# Patient Record
Sex: Female | Born: 1955 | Race: White | Hispanic: No | Marital: Married | Smoking: Former smoker
Health system: Southern US, Community
[De-identification: ages and names within clinical notes are randomized; demographics above are authoritative.]

## PROBLEM LIST (undated history)

## (undated) DIAGNOSIS — R351 Nocturia: Secondary | ICD-10-CM

## (undated) DIAGNOSIS — C760 Malignant neoplasm of head, face and neck: Secondary | ICD-10-CM

## (undated) DIAGNOSIS — N200 Calculus of kidney: Secondary | ICD-10-CM

## (undated) DIAGNOSIS — M1990 Unspecified osteoarthritis, unspecified site: Secondary | ICD-10-CM

## (undated) DIAGNOSIS — J449 Chronic obstructive pulmonary disease, unspecified: Secondary | ICD-10-CM

## (undated) DIAGNOSIS — F419 Anxiety disorder, unspecified: Secondary | ICD-10-CM

## (undated) DIAGNOSIS — F32A Depression, unspecified: Secondary | ICD-10-CM

## (undated) DIAGNOSIS — T884XXA Failed or difficult intubation, initial encounter: Secondary | ICD-10-CM

## (undated) DIAGNOSIS — N3941 Urge incontinence: Secondary | ICD-10-CM

## (undated) DIAGNOSIS — E119 Type 2 diabetes mellitus without complications: Secondary | ICD-10-CM

## (undated) DIAGNOSIS — Z6841 Body Mass Index (BMI) 40.0 and over, adult: Secondary | ICD-10-CM

## (undated) DIAGNOSIS — N289 Disorder of kidney and ureter, unspecified: Secondary | ICD-10-CM

## (undated) DIAGNOSIS — G4730 Sleep apnea, unspecified: Secondary | ICD-10-CM

## (undated) DIAGNOSIS — I10 Essential (primary) hypertension: Secondary | ICD-10-CM

## (undated) DIAGNOSIS — E1140 Type 2 diabetes mellitus with diabetic neuropathy, unspecified: Secondary | ICD-10-CM

## (undated) DIAGNOSIS — G629 Polyneuropathy, unspecified: Secondary | ICD-10-CM

## (undated) DIAGNOSIS — R0602 Shortness of breath: Secondary | ICD-10-CM

## (undated) DIAGNOSIS — F329 Major depressive disorder, single episode, unspecified: Secondary | ICD-10-CM

## (undated) DIAGNOSIS — R21 Rash and other nonspecific skin eruption: Secondary | ICD-10-CM

## (undated) DIAGNOSIS — M797 Fibromyalgia: Secondary | ICD-10-CM

## (undated) DIAGNOSIS — J189 Pneumonia, unspecified organism: Secondary | ICD-10-CM

## (undated) HISTORY — PX: SALIVARY STONE REMOVAL: SHX5213

## (undated) HISTORY — DX: Morbid (severe) obesity due to excess calories: E66.01

## (undated) HISTORY — PX: BRAIN SURGERY: SHX531

## (undated) HISTORY — DX: Body Mass Index (BMI) 40.0 and over, adult: Z684

## (undated) HISTORY — PX: TUBAL LIGATION: SHX77

## (undated) HISTORY — PX: TONSILLECTOMY: SUR1361

## (undated) HISTORY — PX: LARYNGECTOMY: SUR815

## (undated) HISTORY — PX: CHOLECYSTECTOMY: SHX55

## (undated) HISTORY — PX: OTHER SURGICAL HISTORY: SHX169

---

## 1980-02-26 HISTORY — PX: BREAST LUMPECTOMY: SHX2

## 1997-09-02 ENCOUNTER — Emergency Department (HOSPITAL_COMMUNITY): Admission: EM | Admit: 1997-09-02 | Discharge: 1997-09-02 | Payer: Self-pay | Admitting: Emergency Medicine

## 1997-12-14 ENCOUNTER — Emergency Department (HOSPITAL_COMMUNITY): Admission: EM | Admit: 1997-12-14 | Discharge: 1997-12-14 | Payer: Self-pay | Admitting: Emergency Medicine

## 1997-12-14 ENCOUNTER — Encounter: Payer: Self-pay | Admitting: Emergency Medicine

## 1999-01-16 ENCOUNTER — Encounter: Admission: RE | Admit: 1999-01-16 | Discharge: 1999-01-16 | Payer: Self-pay | Admitting: *Deleted

## 1999-01-16 ENCOUNTER — Encounter: Payer: Self-pay | Admitting: *Deleted

## 1999-06-25 ENCOUNTER — Encounter: Payer: Self-pay | Admitting: Family Medicine

## 1999-06-25 ENCOUNTER — Ambulatory Visit (HOSPITAL_COMMUNITY): Admission: RE | Admit: 1999-06-25 | Discharge: 1999-06-25 | Payer: Self-pay | Admitting: Family Medicine

## 1999-06-29 ENCOUNTER — Encounter: Payer: Self-pay | Admitting: Family Medicine

## 1999-06-29 ENCOUNTER — Ambulatory Visit (HOSPITAL_COMMUNITY): Admission: RE | Admit: 1999-06-29 | Discharge: 1999-06-29 | Payer: Self-pay | Admitting: Family Medicine

## 1999-11-14 ENCOUNTER — Ambulatory Visit (HOSPITAL_COMMUNITY): Admission: RE | Admit: 1999-11-14 | Discharge: 1999-11-14 | Payer: Self-pay | Admitting: Family Medicine

## 2000-08-19 ENCOUNTER — Encounter: Payer: Self-pay | Admitting: Family Medicine

## 2000-08-19 ENCOUNTER — Ambulatory Visit (HOSPITAL_COMMUNITY): Admission: RE | Admit: 2000-08-19 | Discharge: 2000-08-19 | Payer: Self-pay | Admitting: Family Medicine

## 2000-08-23 ENCOUNTER — Emergency Department (HOSPITAL_COMMUNITY): Admission: EM | Admit: 2000-08-23 | Discharge: 2000-08-24 | Payer: Self-pay | Admitting: Emergency Medicine

## 2000-09-23 ENCOUNTER — Encounter: Payer: Self-pay | Admitting: Orthopedic Surgery

## 2000-09-23 ENCOUNTER — Encounter: Admission: RE | Admit: 2000-09-23 | Discharge: 2000-09-23 | Payer: Self-pay | Admitting: Orthopedic Surgery

## 2000-10-26 ENCOUNTER — Emergency Department (HOSPITAL_COMMUNITY): Admission: EM | Admit: 2000-10-26 | Discharge: 2000-10-26 | Payer: Self-pay | Admitting: Emergency Medicine

## 2000-10-26 ENCOUNTER — Encounter: Payer: Self-pay | Admitting: Emergency Medicine

## 2001-03-01 ENCOUNTER — Emergency Department (HOSPITAL_COMMUNITY): Admission: EM | Admit: 2001-03-01 | Discharge: 2001-03-02 | Payer: Self-pay | Admitting: Emergency Medicine

## 2001-03-02 ENCOUNTER — Encounter: Payer: Self-pay | Admitting: *Deleted

## 2001-05-23 ENCOUNTER — Encounter: Payer: Self-pay | Admitting: Emergency Medicine

## 2001-05-23 ENCOUNTER — Emergency Department (HOSPITAL_COMMUNITY): Admission: EM | Admit: 2001-05-23 | Discharge: 2001-05-23 | Payer: Self-pay | Admitting: Emergency Medicine

## 2001-06-10 ENCOUNTER — Encounter: Payer: Self-pay | Admitting: Orthopedic Surgery

## 2001-06-10 ENCOUNTER — Encounter: Admission: RE | Admit: 2001-06-10 | Discharge: 2001-06-10 | Payer: Self-pay | Admitting: Orthopedic Surgery

## 2001-06-11 ENCOUNTER — Encounter (INDEPENDENT_AMBULATORY_CARE_PROVIDER_SITE_OTHER): Payer: Self-pay | Admitting: Specialist

## 2001-06-11 ENCOUNTER — Ambulatory Visit (HOSPITAL_BASED_OUTPATIENT_CLINIC_OR_DEPARTMENT_OTHER): Admission: RE | Admit: 2001-06-11 | Discharge: 2001-06-11 | Payer: Self-pay | Admitting: Orthopedic Surgery

## 2001-06-13 ENCOUNTER — Emergency Department (HOSPITAL_COMMUNITY): Admission: EM | Admit: 2001-06-13 | Discharge: 2001-06-13 | Payer: Self-pay | Admitting: Emergency Medicine

## 2001-07-23 ENCOUNTER — Ambulatory Visit (HOSPITAL_BASED_OUTPATIENT_CLINIC_OR_DEPARTMENT_OTHER): Admission: RE | Admit: 2001-07-23 | Discharge: 2001-07-23 | Payer: Self-pay | Admitting: Orthopedic Surgery

## 2002-05-01 ENCOUNTER — Emergency Department (HOSPITAL_COMMUNITY): Admission: EM | Admit: 2002-05-01 | Discharge: 2002-05-01 | Payer: Self-pay | Admitting: Emergency Medicine

## 2002-08-02 ENCOUNTER — Ambulatory Visit (HOSPITAL_BASED_OUTPATIENT_CLINIC_OR_DEPARTMENT_OTHER): Admission: RE | Admit: 2002-08-02 | Discharge: 2002-08-02 | Payer: Self-pay | Admitting: Family Medicine

## 2002-08-20 ENCOUNTER — Encounter: Payer: Self-pay | Admitting: Family Medicine

## 2002-08-20 ENCOUNTER — Ambulatory Visit (HOSPITAL_COMMUNITY): Admission: RE | Admit: 2002-08-20 | Discharge: 2002-08-20 | Payer: Self-pay | Admitting: Family Medicine

## 2003-08-04 ENCOUNTER — Emergency Department (HOSPITAL_COMMUNITY): Admission: EM | Admit: 2003-08-04 | Discharge: 2003-08-04 | Payer: Self-pay | Admitting: Emergency Medicine

## 2003-09-08 ENCOUNTER — Emergency Department (HOSPITAL_COMMUNITY): Admission: EM | Admit: 2003-09-08 | Discharge: 2003-09-08 | Payer: Self-pay | Admitting: Emergency Medicine

## 2004-01-25 ENCOUNTER — Emergency Department (HOSPITAL_COMMUNITY): Admission: EM | Admit: 2004-01-25 | Discharge: 2004-01-25 | Payer: Self-pay | Admitting: Emergency Medicine

## 2004-03-07 ENCOUNTER — Emergency Department (HOSPITAL_COMMUNITY): Admission: EM | Admit: 2004-03-07 | Discharge: 2004-03-07 | Payer: Self-pay | Admitting: *Deleted

## 2004-03-21 ENCOUNTER — Emergency Department (HOSPITAL_COMMUNITY): Admission: EM | Admit: 2004-03-21 | Discharge: 2004-03-21 | Payer: Self-pay | Admitting: Emergency Medicine

## 2004-04-04 ENCOUNTER — Emergency Department (HOSPITAL_COMMUNITY): Admission: EM | Admit: 2004-04-04 | Discharge: 2004-04-04 | Payer: Self-pay | Admitting: Emergency Medicine

## 2004-05-01 ENCOUNTER — Emergency Department (HOSPITAL_COMMUNITY): Admission: EM | Admit: 2004-05-01 | Discharge: 2004-05-01 | Payer: Self-pay | Admitting: Emergency Medicine

## 2004-05-07 ENCOUNTER — Ambulatory Visit: Payer: Self-pay | Admitting: Family Medicine

## 2004-08-08 ENCOUNTER — Emergency Department (HOSPITAL_COMMUNITY): Admission: EM | Admit: 2004-08-08 | Discharge: 2004-08-08 | Payer: Self-pay | Admitting: Emergency Medicine

## 2004-08-13 ENCOUNTER — Ambulatory Visit: Payer: Self-pay | Admitting: Family Medicine

## 2004-08-25 ENCOUNTER — Emergency Department (HOSPITAL_COMMUNITY): Admission: EM | Admit: 2004-08-25 | Discharge: 2004-08-25 | Payer: Self-pay | Admitting: Emergency Medicine

## 2004-08-27 ENCOUNTER — Ambulatory Visit: Payer: Self-pay | Admitting: Family Medicine

## 2004-08-27 ENCOUNTER — Ambulatory Visit (HOSPITAL_COMMUNITY): Admission: RE | Admit: 2004-08-27 | Discharge: 2004-08-27 | Payer: Self-pay | Admitting: Family Medicine

## 2004-09-03 ENCOUNTER — Ambulatory Visit (HOSPITAL_COMMUNITY): Admission: RE | Admit: 2004-09-03 | Discharge: 2004-09-03 | Payer: Self-pay | Admitting: Family Medicine

## 2004-09-19 ENCOUNTER — Ambulatory Visit: Payer: Self-pay | Admitting: Pulmonary Disease

## 2004-11-17 ENCOUNTER — Ambulatory Visit (HOSPITAL_BASED_OUTPATIENT_CLINIC_OR_DEPARTMENT_OTHER): Admission: RE | Admit: 2004-11-17 | Discharge: 2004-11-17 | Payer: Self-pay | Admitting: Pulmonary Disease

## 2004-11-29 ENCOUNTER — Ambulatory Visit: Payer: Self-pay | Admitting: Pulmonary Disease

## 2004-12-25 ENCOUNTER — Ambulatory Visit: Payer: Self-pay | Admitting: Pulmonary Disease

## 2005-01-23 ENCOUNTER — Ambulatory Visit: Payer: Self-pay | Admitting: Pulmonary Disease

## 2005-04-16 ENCOUNTER — Ambulatory Visit: Payer: Self-pay | Admitting: Pulmonary Disease

## 2005-05-17 ENCOUNTER — Ambulatory Visit: Payer: Self-pay | Admitting: Pulmonary Disease

## 2005-07-01 ENCOUNTER — Ambulatory Visit: Payer: Self-pay | Admitting: Family Medicine

## 2005-08-21 ENCOUNTER — Emergency Department (HOSPITAL_COMMUNITY): Admission: EM | Admit: 2005-08-21 | Discharge: 2005-08-21 | Payer: Self-pay | Admitting: Emergency Medicine

## 2005-08-23 ENCOUNTER — Encounter (INDEPENDENT_AMBULATORY_CARE_PROVIDER_SITE_OTHER): Payer: Self-pay | Admitting: Specialist

## 2005-08-23 ENCOUNTER — Ambulatory Visit: Payer: Self-pay | Admitting: Infectious Diseases

## 2005-08-23 ENCOUNTER — Ambulatory Visit: Payer: Self-pay | Admitting: Internal Medicine

## 2005-08-23 ENCOUNTER — Inpatient Hospital Stay (HOSPITAL_COMMUNITY): Admission: EM | Admit: 2005-08-23 | Discharge: 2005-08-29 | Payer: Self-pay | Admitting: Emergency Medicine

## 2005-08-24 ENCOUNTER — Encounter (INDEPENDENT_AMBULATORY_CARE_PROVIDER_SITE_OTHER): Payer: Self-pay | Admitting: Interventional Cardiology

## 2005-09-16 ENCOUNTER — Ambulatory Visit: Payer: Self-pay | Admitting: Family Medicine

## 2005-10-03 ENCOUNTER — Ambulatory Visit: Payer: Self-pay | Admitting: *Deleted

## 2005-11-12 ENCOUNTER — Ambulatory Visit: Payer: Self-pay | Admitting: Family Medicine

## 2005-11-25 DIAGNOSIS — R809 Proteinuria, unspecified: Secondary | ICD-10-CM | POA: Insufficient documentation

## 2005-11-28 ENCOUNTER — Ambulatory Visit: Payer: Self-pay | Admitting: Family Medicine

## 2005-12-04 ENCOUNTER — Ambulatory Visit (HOSPITAL_COMMUNITY): Admission: RE | Admit: 2005-12-04 | Discharge: 2005-12-04 | Payer: Self-pay | Admitting: Internal Medicine

## 2005-12-04 ENCOUNTER — Encounter: Payer: Self-pay | Admitting: Vascular Surgery

## 2005-12-04 ENCOUNTER — Ambulatory Visit (HOSPITAL_COMMUNITY): Admission: RE | Admit: 2005-12-04 | Discharge: 2005-12-04 | Payer: Self-pay | Admitting: Family Medicine

## 2005-12-13 ENCOUNTER — Encounter: Admission: RE | Admit: 2005-12-13 | Discharge: 2005-12-13 | Payer: Self-pay | Admitting: Family Medicine

## 2006-03-11 ENCOUNTER — Other Ambulatory Visit: Admission: RE | Admit: 2006-03-11 | Discharge: 2006-03-11 | Payer: Self-pay | Admitting: Family Medicine

## 2006-03-11 ENCOUNTER — Encounter (INDEPENDENT_AMBULATORY_CARE_PROVIDER_SITE_OTHER): Payer: Self-pay | Admitting: Family Medicine

## 2006-03-11 ENCOUNTER — Ambulatory Visit: Payer: Self-pay | Admitting: Family Medicine

## 2006-06-30 ENCOUNTER — Encounter: Admission: RE | Admit: 2006-06-30 | Discharge: 2006-06-30 | Payer: Self-pay | Admitting: Sports Medicine

## 2006-10-28 DIAGNOSIS — G619 Inflammatory polyneuropathy, unspecified: Secondary | ICD-10-CM | POA: Insufficient documentation

## 2006-10-28 DIAGNOSIS — G622 Polyneuropathy due to other toxic agents: Secondary | ICD-10-CM | POA: Insufficient documentation

## 2006-10-28 DIAGNOSIS — B1710 Acute hepatitis C without hepatic coma: Secondary | ICD-10-CM

## 2006-10-28 DIAGNOSIS — M1710 Unilateral primary osteoarthritis, unspecified knee: Secondary | ICD-10-CM

## 2006-10-28 DIAGNOSIS — E119 Type 2 diabetes mellitus without complications: Secondary | ICD-10-CM

## 2006-10-28 DIAGNOSIS — I10 Essential (primary) hypertension: Secondary | ICD-10-CM

## 2006-10-28 DIAGNOSIS — G4733 Obstructive sleep apnea (adult) (pediatric): Secondary | ICD-10-CM

## 2006-11-02 DIAGNOSIS — E785 Hyperlipidemia, unspecified: Secondary | ICD-10-CM | POA: Insufficient documentation

## 2006-11-02 DIAGNOSIS — I739 Peripheral vascular disease, unspecified: Secondary | ICD-10-CM

## 2006-12-07 ENCOUNTER — Emergency Department (HOSPITAL_COMMUNITY): Admission: EM | Admit: 2006-12-07 | Discharge: 2006-12-07 | Payer: Self-pay | Admitting: Emergency Medicine

## 2007-02-24 ENCOUNTER — Emergency Department (HOSPITAL_COMMUNITY): Admission: EM | Admit: 2007-02-24 | Discharge: 2007-02-25 | Payer: Self-pay | Admitting: Emergency Medicine

## 2007-03-02 ENCOUNTER — Telehealth (INDEPENDENT_AMBULATORY_CARE_PROVIDER_SITE_OTHER): Payer: Self-pay | Admitting: Nurse Practitioner

## 2007-03-04 ENCOUNTER — Emergency Department (HOSPITAL_COMMUNITY): Admission: EM | Admit: 2007-03-04 | Discharge: 2007-03-04 | Payer: Self-pay | Admitting: Emergency Medicine

## 2007-03-05 ENCOUNTER — Encounter (INDEPENDENT_AMBULATORY_CARE_PROVIDER_SITE_OTHER): Payer: Self-pay | Admitting: Family Medicine

## 2007-03-17 ENCOUNTER — Telehealth (INDEPENDENT_AMBULATORY_CARE_PROVIDER_SITE_OTHER): Payer: Self-pay | Admitting: Family Medicine

## 2007-04-15 ENCOUNTER — Ambulatory Visit: Payer: Self-pay | Admitting: Family Medicine

## 2007-04-15 DIAGNOSIS — G894 Chronic pain syndrome: Secondary | ICD-10-CM

## 2007-04-15 LAB — CONVERTED CEMR LAB: Hgb A1c MFr Bld: 7 %

## 2007-04-20 LAB — CONVERTED CEMR LAB
ALT: 23 units/L (ref 0–35)
AST: 22 units/L (ref 0–37)
Albumin: 4.4 g/dL (ref 3.5–5.2)
Alkaline Phosphatase: 102 units/L (ref 39–117)
Basophils Absolute: 0 10*3/uL (ref 0.0–0.1)
Basophils Relative: 0 % (ref 0–1)
Eosinophils Absolute: 0.2 10*3/uL (ref 0.0–0.7)
Eosinophils Relative: 2 % (ref 0–5)
HCT: 47.2 % — ABNORMAL HIGH (ref 36.0–46.0)
MCV: 93.3 fL (ref 78.0–100.0)
Neutrophils Relative %: 56 % (ref 43–77)
Platelets: 269 10*3/uL (ref 150–400)
Potassium: 3.8 meq/L (ref 3.5–5.3)
RDW: 13.8 % (ref 11.5–15.5)
Sodium: 143 meq/L (ref 135–145)
TSH: 2.295 microintl units/mL (ref 0.350–5.50)
Total Bilirubin: 0.4 mg/dL (ref 0.3–1.2)
Total Protein: 7.4 g/dL (ref 6.0–8.3)
Triglycerides: 418 mg/dL — ABNORMAL HIGH (ref <150)
WBC: 9.4 10*3/uL (ref 4.0–10.5)

## 2007-04-24 ENCOUNTER — Encounter (INDEPENDENT_AMBULATORY_CARE_PROVIDER_SITE_OTHER): Payer: Self-pay | Admitting: Family Medicine

## 2007-05-21 ENCOUNTER — Telehealth (INDEPENDENT_AMBULATORY_CARE_PROVIDER_SITE_OTHER): Payer: Self-pay | Admitting: Family Medicine

## 2007-06-01 ENCOUNTER — Telehealth (INDEPENDENT_AMBULATORY_CARE_PROVIDER_SITE_OTHER): Payer: Self-pay | Admitting: *Deleted

## 2007-06-04 ENCOUNTER — Encounter (INDEPENDENT_AMBULATORY_CARE_PROVIDER_SITE_OTHER): Payer: Self-pay | Admitting: Family Medicine

## 2007-07-02 ENCOUNTER — Telehealth (INDEPENDENT_AMBULATORY_CARE_PROVIDER_SITE_OTHER): Payer: Self-pay | Admitting: Family Medicine

## 2007-07-14 ENCOUNTER — Ambulatory Visit: Payer: Self-pay | Admitting: Family Medicine

## 2007-07-15 ENCOUNTER — Encounter (INDEPENDENT_AMBULATORY_CARE_PROVIDER_SITE_OTHER): Payer: Self-pay | Admitting: Family Medicine

## 2007-07-16 ENCOUNTER — Encounter (INDEPENDENT_AMBULATORY_CARE_PROVIDER_SITE_OTHER): Payer: Self-pay | Admitting: Family Medicine

## 2007-07-21 ENCOUNTER — Encounter (INDEPENDENT_AMBULATORY_CARE_PROVIDER_SITE_OTHER): Payer: Self-pay | Admitting: *Deleted

## 2007-07-21 LAB — CONVERTED CEMR LAB
ALT: 22 units/L (ref 0–35)
AST: 24 units/L (ref 0–37)
Albumin: 4.1 g/dL (ref 3.5–5.2)
Alkaline Phosphatase: 108 units/L (ref 39–117)
BUN: 10 mg/dL (ref 6–23)
HDL: 45 mg/dL (ref >39)
Potassium: 3.7 meq/L (ref 3.5–5.3)
Sodium: 142 meq/L (ref 135–145)

## 2007-08-11 ENCOUNTER — Encounter (INDEPENDENT_AMBULATORY_CARE_PROVIDER_SITE_OTHER): Payer: Self-pay | Admitting: Family Medicine

## 2007-09-03 ENCOUNTER — Telehealth (INDEPENDENT_AMBULATORY_CARE_PROVIDER_SITE_OTHER): Payer: Self-pay | Admitting: Family Medicine

## 2007-10-17 ENCOUNTER — Telehealth (INDEPENDENT_AMBULATORY_CARE_PROVIDER_SITE_OTHER): Payer: Self-pay | Admitting: *Deleted

## 2008-03-18 ENCOUNTER — Telehealth (INDEPENDENT_AMBULATORY_CARE_PROVIDER_SITE_OTHER): Payer: Self-pay | Admitting: *Deleted

## 2008-03-24 ENCOUNTER — Encounter (INDEPENDENT_AMBULATORY_CARE_PROVIDER_SITE_OTHER): Payer: Self-pay | Admitting: Family Medicine

## 2008-03-24 ENCOUNTER — Encounter (INDEPENDENT_AMBULATORY_CARE_PROVIDER_SITE_OTHER): Payer: Self-pay | Admitting: *Deleted

## 2008-04-29 ENCOUNTER — Telehealth (INDEPENDENT_AMBULATORY_CARE_PROVIDER_SITE_OTHER): Payer: Self-pay | Admitting: Family Medicine

## 2008-05-02 ENCOUNTER — Encounter (INDEPENDENT_AMBULATORY_CARE_PROVIDER_SITE_OTHER): Payer: Self-pay | Admitting: Family Medicine

## 2008-06-07 ENCOUNTER — Ambulatory Visit: Payer: Self-pay | Admitting: Family Medicine

## 2008-06-07 LAB — CONVERTED CEMR LAB
ALT: 23 units/L (ref 0–35)
Alkaline Phosphatase: 89 units/L (ref 39–117)
Basophils Absolute: 0.1 10*3/uL (ref 0.0–0.1)
Basophils Relative: 1 % (ref 0–1)
Blood Glucose, Fingerstick: 119
Hgb A1c MFr Bld: 6.5 %
MCHC: 33 g/dL (ref 30.0–36.0)
Monocytes Relative: 5 % (ref 3–12)
Neutro Abs: 3.8 10*3/uL (ref 1.7–7.7)
Neutrophils Relative %: 45 % (ref 43–77)
RBC: 4.92 M/uL (ref 3.87–5.11)
RDW: 13.5 % (ref 11.5–15.5)
Sodium: 142 meq/L (ref 135–145)
Total Bilirubin: 0.3 mg/dL (ref 0.3–1.2)
Total Protein: 7.1 g/dL (ref 6.0–8.3)

## 2008-07-07 ENCOUNTER — Telehealth (INDEPENDENT_AMBULATORY_CARE_PROVIDER_SITE_OTHER): Payer: Self-pay | Admitting: Family Medicine

## 2008-07-26 ENCOUNTER — Encounter (INDEPENDENT_AMBULATORY_CARE_PROVIDER_SITE_OTHER): Payer: Self-pay | Admitting: Family Medicine

## 2008-11-21 ENCOUNTER — Encounter (INDEPENDENT_AMBULATORY_CARE_PROVIDER_SITE_OTHER): Payer: Self-pay | Admitting: Nurse Practitioner

## 2009-05-01 ENCOUNTER — Encounter: Admission: RE | Admit: 2009-05-01 | Discharge: 2009-06-26 | Payer: Self-pay | Admitting: Anesthesiology

## 2009-08-11 ENCOUNTER — Ambulatory Visit: Payer: Self-pay | Admitting: Physician Assistant

## 2009-08-11 DIAGNOSIS — F329 Major depressive disorder, single episode, unspecified: Secondary | ICD-10-CM

## 2009-08-11 DIAGNOSIS — F3289 Other specified depressive episodes: Secondary | ICD-10-CM | POA: Insufficient documentation

## 2009-08-11 DIAGNOSIS — R609 Edema, unspecified: Secondary | ICD-10-CM | POA: Insufficient documentation

## 2009-08-11 DIAGNOSIS — J449 Chronic obstructive pulmonary disease, unspecified: Secondary | ICD-10-CM

## 2009-08-11 DIAGNOSIS — J4489 Other specified chronic obstructive pulmonary disease: Secondary | ICD-10-CM | POA: Insufficient documentation

## 2009-08-11 DIAGNOSIS — R0602 Shortness of breath: Secondary | ICD-10-CM | POA: Insufficient documentation

## 2009-08-16 LAB — CONVERTED CEMR LAB
ALT: 22 units/L (ref 0–35)
AST: 30 units/L (ref 0–37)
Calcium: 8.6 mg/dL (ref 8.4–10.5)
Chloride: 102 meq/L (ref 96–112)
Creatinine, Ser: 0.97 mg/dL (ref 0.40–1.20)
Eosinophils Absolute: 0.2 10*3/uL (ref 0.0–0.7)
HCV Ab: REACTIVE — AB
Hep A Total Ab: NEGATIVE
Hep B Core Total Ab: NEGATIVE
Hep B S Ab: NEGATIVE
Hepatitis B Surface Ag: NEGATIVE
Lymphocytes Relative: 42 % (ref 12–46)
Lymphs Abs: 2.8 10*3/uL (ref 0.7–4.0)
MCV: 88.5 fL (ref 78.0–100.0)
Neutrophils Relative %: 50 % (ref 43–77)
Platelets: 202 10*3/uL (ref 150–400)
TSH: 2.738 microintl units/mL (ref 0.350–4.500)
Total CHOL/HDL Ratio: 5.1
WBC: 6.8 10*3/uL (ref 4.0–10.5)

## 2009-08-21 ENCOUNTER — Encounter: Payer: Self-pay | Admitting: Physician Assistant

## 2009-08-30 ENCOUNTER — Telehealth: Payer: Self-pay | Admitting: Physician Assistant

## 2009-09-01 ENCOUNTER — Ambulatory Visit: Payer: Self-pay | Admitting: Physician Assistant

## 2009-09-01 DIAGNOSIS — B354 Tinea corporis: Secondary | ICD-10-CM | POA: Insufficient documentation

## 2009-09-01 LAB — CONVERTED CEMR LAB
BUN: 14 mg/dL (ref 6–23)
CO2: 24 meq/L (ref 19–32)
Chloride: 100 meq/L (ref 96–112)
Creatinine, Ser: 1.02 mg/dL (ref 0.40–1.20)
Glucose, Bld: 103 mg/dL — ABNORMAL HIGH (ref 70–99)

## 2009-09-04 ENCOUNTER — Encounter: Payer: Self-pay | Admitting: Physician Assistant

## 2009-09-18 ENCOUNTER — Ambulatory Visit: Payer: Self-pay | Admitting: Physician Assistant

## 2009-09-19 LAB — CONVERTED CEMR LAB
Alkaline Phosphatase: 102 units/L (ref 39–117)
Bilirubin, Direct: 0.1 mg/dL (ref 0.0–0.3)
Indirect Bilirubin: 0.3 mg/dL (ref 0.0–0.9)
Total Bilirubin: 0.4 mg/dL (ref 0.3–1.2)

## 2009-10-03 ENCOUNTER — Encounter (INDEPENDENT_AMBULATORY_CARE_PROVIDER_SITE_OTHER): Payer: Self-pay | Admitting: Nurse Practitioner

## 2009-10-05 ENCOUNTER — Telehealth: Payer: Self-pay | Admitting: Physician Assistant

## 2009-11-07 IMAGING — CR DG FOOT COMPLETE 3+V*L*
3 series · 3 of 3 positions shown · non-contrast
Comparison: None.

CLINICAL DATA: Fall with lateral left foot pain.
 LEFT FOOT ? 3 VIEW:

[t foot ap left]
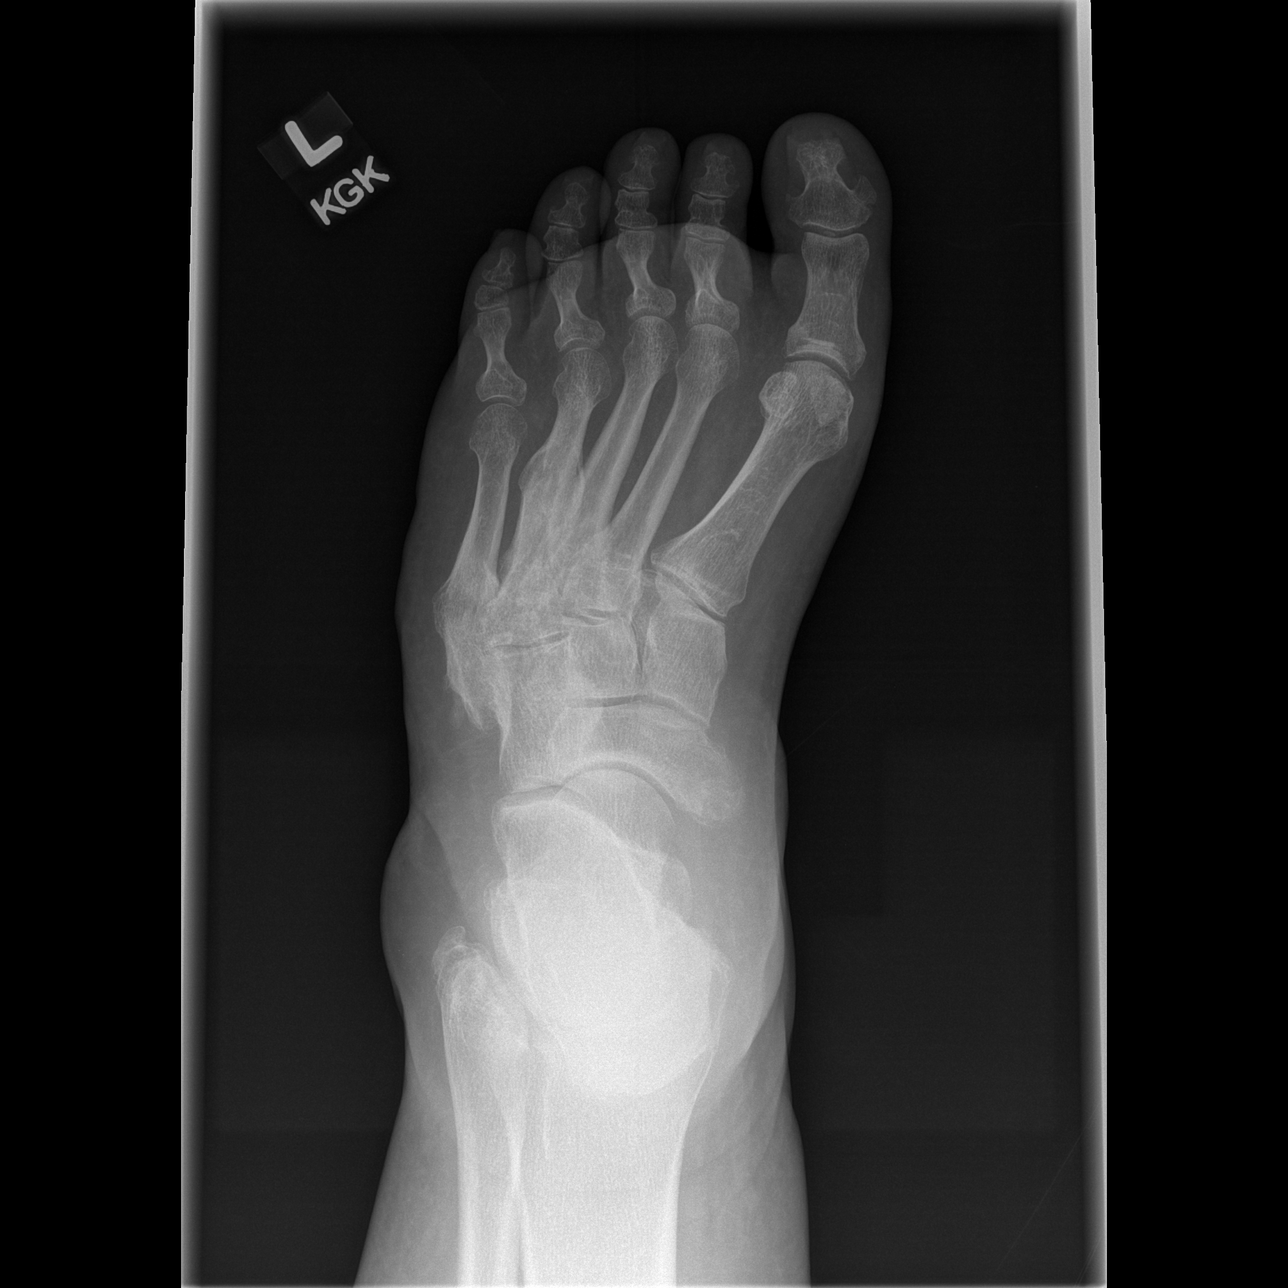

[t foot oblique left]
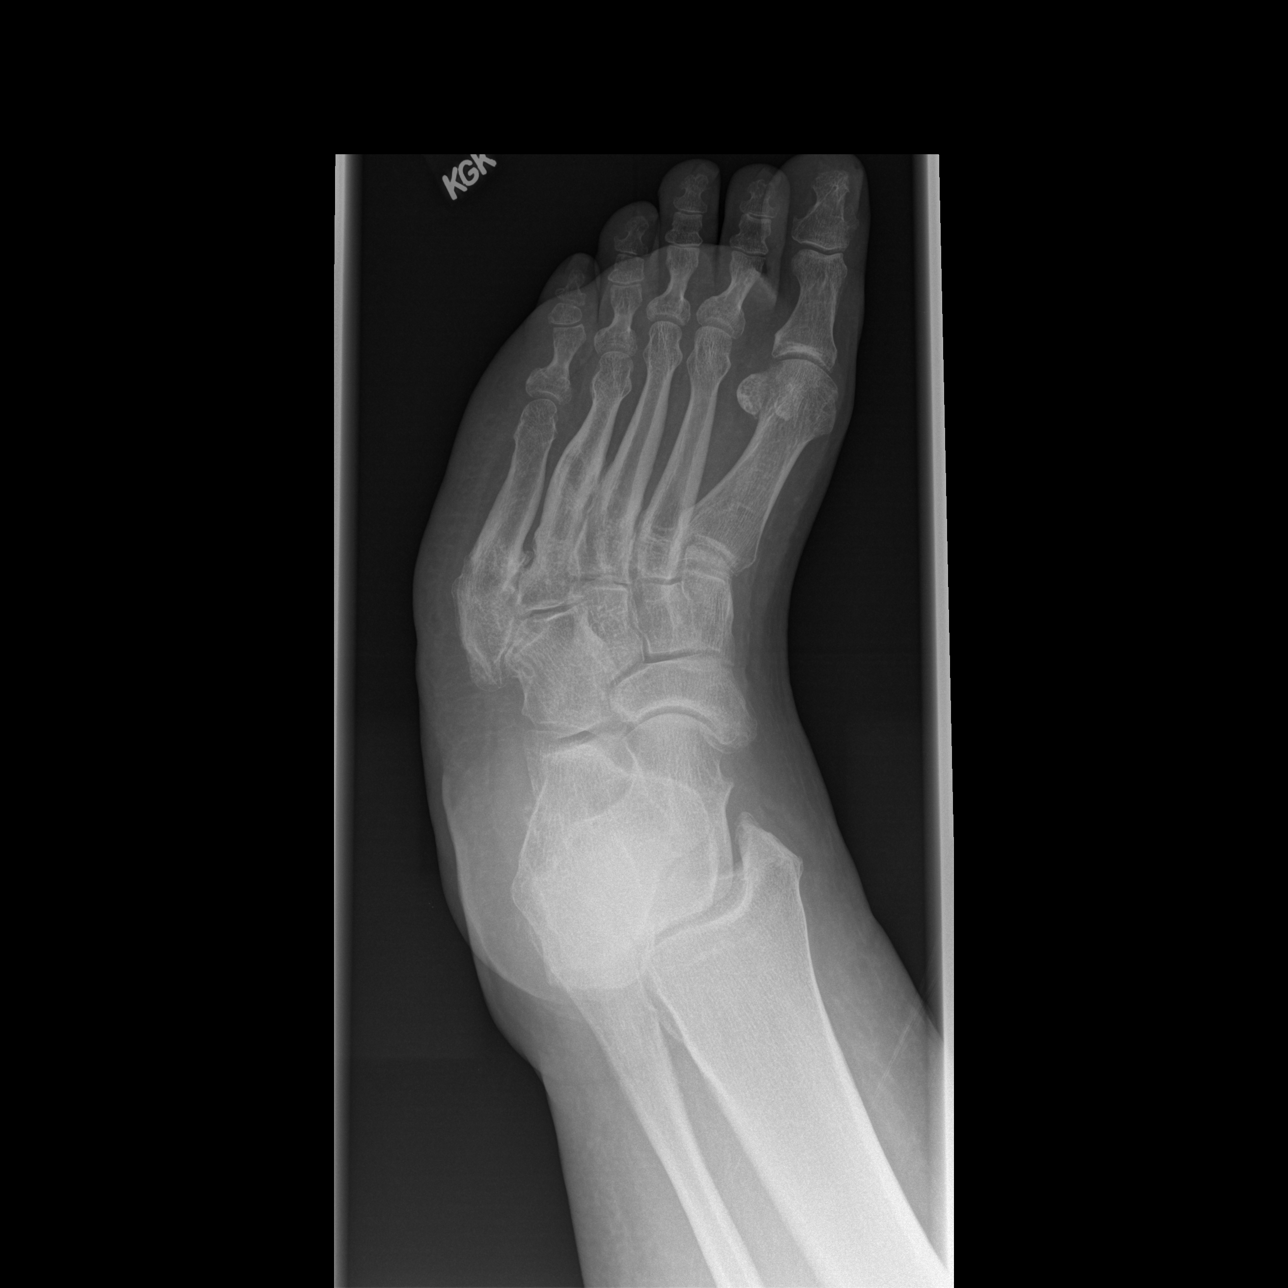

[t foot lat left]
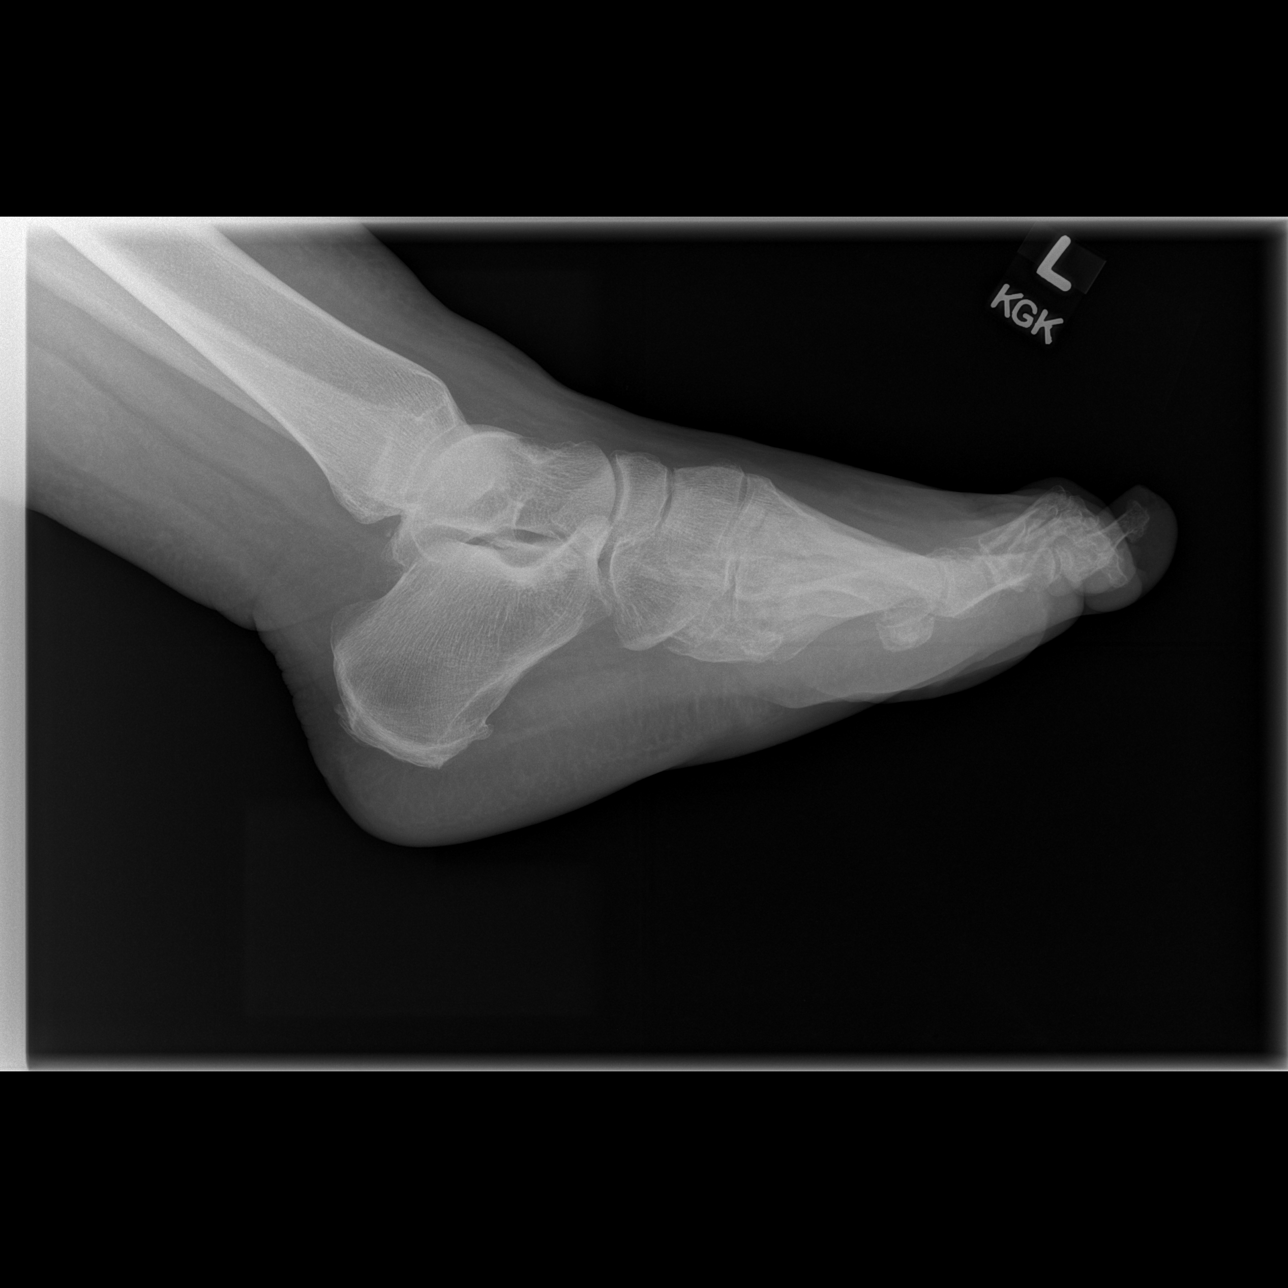

[3 of 3 positions shown; findings below may reference images not displayed]

FINDINGS: There is healed deformity involving the proximal 5th metatarsal related to prior injury.  No acute fracture or dislocation is seen.  Healed deformity is also seen involving the 4th metatarsal.  Degenerative changes are seen involving tarsometatarsal joints.  No focal soft tissue abnormalities.
IMPRESSION: No acute fracture identified.  Healed deformities of 4th and 5th metatarsals related to prior fracture.

## 2010-01-02 ENCOUNTER — Encounter (INDEPENDENT_AMBULATORY_CARE_PROVIDER_SITE_OTHER): Payer: Self-pay | Admitting: Nurse Practitioner

## 2010-03-18 ENCOUNTER — Encounter: Payer: Self-pay | Admitting: Family Medicine

## 2010-03-27 NOTE — Medication Information (Signed)
Summary: RX Folder//DIOVAN//APPROVED  RX Folder//DIOVAN//APPROVED   Imported By: Arta Bruce 08/31/2009 14:40:07  _____________________________________________________________________  External Attachment:    Type:   Image     Comment:   External Document

## 2010-03-27 NOTE — Progress Notes (Signed)
Summary: Medication dose change  Phone Note Call from Patient Call back at [phone]   Summary of Call: The pt states that the provider suggested her to increase the pravastatin medication but she is taking only 20 mg.  She is taking 2 pills per day but now she is running out because she only get 30 pills.  The provider either needs to increase the dos to 40 mg or increase the amount of pills 60 per month.  ([facility] [address]) Alben Spittle PA-c  Initial call taken by: Manon Hilding,  October 05, 2009 12:04 PM  Follow-up for Phone Call        called pharmacy and let them know about the increase Follow-up by: [place] Shannon,  October 05, 2009 12:18 PM

## 2010-03-27 NOTE — Assessment & Plan Note (Signed)
Summary: 2-3 week f/u per Lorin Picket /tmm   Vital Signs:  Patient profile:   55 year old female Height:      65 inches Weight:      308 pounds Temp:     98.0 degrees F oral Pulse rate:   76 / minute Pulse rhythm:   regular Resp:     18 per minute BP sitting:   170 / 109  (left arm) Cuff size:   large  Vitals Entered By: [place] Shannon (September 01, 2009 10:12 AM) CC: follow-up visit, medications for blood pressure, reoccuring yeast infection under breast, tried OTC medication for vaginal yeast infection, Hypertension Management Is Patient Diabetic? Yes Pain Assessment Patient in pain? no      CBG Result 120  Does patient need assistance? Functional Status Self care Ambulation Normal   Primary Care Provider:  Tereso Newcomer, PA-C  CC:  follow-up visit, medications for blood pressure, reoccuring yeast infection under breast, tried OTC medication for vaginal yeast infection, and Hypertension Management.  History of Present Illness: Here for f/u.  Depression:  Put back Prozac last time.  Has had episodes feeling angry in the past with restarting Prozac.  Does not have this now.  She denies worsening depression.  She denies thoughts of suicide.  HTN:  Unfortunately, Medicaid has taken 2 weeks to approve her medication.  She picks it up today.  Tinea:  Has rash under bilat breasts.  Has used nystatin powder in past with relief.  Notes burning and itching.  Dyspnea:  BNP was ok.  Has COPD and Smokes and is obese.  Likely multifactorial and related to deconditioning.  HCV:  Neg. viral load.   Hypertension History:      She complains of dyspnea with exertion, but denies headache and chest pain.  Further comments include: chronic DOE stable.        Positive major cardiovascular risk factors include diabetes, hyperlipidemia, hypertension, and current tobacco user.  Negative major cardiovascular risk factors include female age less than 81 years old.        Positive history for target  organ damage include peripheral vascular disease.    Problems Prior to Update: 1)  Tinea Corporis  (ICD-110.5) 2)  Preventive Health Care  (ICD-V70.0) 3)  Edema  (ICD-782.3) 4)  Dyspnea  (ICD-786.05) 5)  COPD  (ICD-496) 6)  Depression  (ICD-311) 7)  Chronic Pain Syndrome  (ICD-338.4) 8)  Proteinuria  (ICD-791.0) 9)  Peripheral Vascular Disease  (ICD-443.9) 10)  Hyperlipidemia Nec/nos  (ICD-272.4) 11)  Osteoarthritis, Knees, Bilateral  (ICD-715.96) 12)  Morbid Obesity  (ICD-278.01) 13)  Hypertension  (ICD-401.9) 14)  Hepatitis C  (ICD-070.51) 15)  Diabetes Mellitus, Type II  (ICD-250.00) 16)  Polyneuropathy  (ICD-357.9) 17)  Obstructive Sleep Apnea  (ICD-327.23)  Current Medications (verified): 1)  Furosemide 40 Mg Tabs (Furosemide) .... Take 2 Tablets By Mouth Qam and 1 Tab By Mouth Qpm 2)  Combivent 103-18 Mcg/act  Aero (Albuterol-Ipratropium) .... Inhale 3 Times Daily 3)  Klor-Con M20 20 Meq  Tbcr (Potassium Chloride Crys Cr) .... Take 1 Tablet By Mouth Once A Day 4)  Diovan 80 Mg Tabs (Valsartan) .... Take 1 Tablet By Mouth Once A Day For Blood Pressure 5)  Metformin Hcl 500 Mg Tabs (Metformin Hcl) .... Take 1 Tablet By Mouth Once A Day For Diabetes 6)  Ibuprofen 800 Mg Tabs (Ibuprofen) .... Take 1 Tablet By Mouth Every 8 Hours As Needed For Pain 7)  Methadose 10 Mg  Tabs (Methadone Hcl) .... 4 Tablets  By Mouth Q 8 Hours (Dr.dakwa At [facility]) 8)  Fluoxetine Hcl 20 Mg  Tabs (Fluoxetine Hcl) .... Take 3 Tablets By Mouth Once Daily 9)  Glucose Meter of Choice .... Check Sugars Once Daily 10)  Glucose Meter Strips .... Check Sugar Once A Day 11)  Lancets .... Check Sugar Once Daily  Allergies (verified): 1)  ! Ace Inhibitors  Past History:  Past Medical History: Last updated: 08/16/2009 HYPERTENSION (ICD-401.9) HEPATITIS C (ICD-070.51)   a.  diagnosed years ago   b.  never offered treatment   c.  viral load negative in 07/2009 DIABETES MELLITUS,  TYPE II (ICD-250.00) POLYNEUROPATHY (ICD-357.9) OBSTRUCTIVE SLEEP APNEA (ICD-327.23) OSTEOARTHRITIS, KNEES, BILATERAL (ICD-715.96) MORBID OBESITY (ICD-278.01) METHADONE DEPENDENCE MEDIASTINAL and BIHILAR ADENOPATHY. chest ct 2006. see Dr.Gonzalez note. Depression COPD Hyperlipidemia DJD Thoracic and Lumbar DDD  Physical Exam  General:  alert, well-developed, and well-nourished.   Head:  normocephalic and atraumatic.   Neck:  no jvd at 90 degrees Lungs:  bilat exp rhonchi no rales  Heart:  normal rate and regular rhythm.   Extremities:  1+ left pedal edema and 1+ right pedal edema.   chronic stasis changes noted  Neurologic:  alert & oriented X3 and cranial nerves II-XII intact.   Skin:  annular erythematous rash with excoriation  Psych:  normally interactive.     Impression & Recommendations:  Problem # 1:  DEPRESSION (ICD-311) stable tol prozac well  Her updated medication list for this problem includes:    Fluoxetine Hcl 20 Mg Tabs (Fluoxetine hcl) .Marland Kitchen... Take 3 tablets by mouth once daily  Problem # 2:  DYSPNEA (ICD-786.05) likely multifactorial BNP was normal last time EF normal in past has COPD with ongoing tobacco abuse +obesity + deconditioning from pain discussed this with her today  Problem # 3:  HYPERTENSION (ICD-401.9) Assessment: Unchanged  starts Diovan today BP check in 2 weeks with a bmet ? add norvasc (with edema would like to avoid)  Her updated medication list for this problem includes:    Furosemide 40 Mg Tabs (Furosemide) .Marland Kitchen... Take 2 tablets by mouth qam and 1 tab by mouth qpm    Diovan 80 Mg Tabs (Valsartan) .Marland Kitchen... Take 1 tablet by mouth once a day for blood pressure  Orders: T-Basic Metabolic Panel [phone])  Problem # 4:  EDEMA (ICD-782.3) better  check bmet today with lasix and K+  Her updated medication list for this problem includes:    Furosemide 40 Mg Tabs (Furosemide) .Marland Kitchen... Take 2 tablets by mouth qam and 1 tab by  mouth qpm  Orders: T-Basic Metabolic Panel [phone])  Problem # 5:  HEPATITIS C (ICD-070.51) discussed with patient neg viral load  Problem # 6:  PROTEINURIA (ICD-791.0) + microalbumin check u/a when BP better  Problem # 7:  HYPERLIPIDEMIA NEC/NOS (ICD-272.4) check FLP in 6 weeks  Problem # 8:  DIABETES MELLITUS, TYPE II (ICD-250.00) just started back on metformin last visit A1C was 7.2  Her updated medication list for this problem includes:    Diovan 80 Mg Tabs (Valsartan) .Marland Kitchen... Take 1 tablet by mouth once a day for blood pressure    Metformin Hcl 500 Mg Tabs (Metformin hcl) .Marland Kitchen... Take 1 tablet by mouth once a day for diabetes  Problem # 9:  TINEA CORPORIS (ICD-110.5) refill nystatin powder  Complete Medication List: 1)  Furosemide 40 Mg Tabs (Furosemide) .... Take 2 tablets by mouth qam and 1 tab by  mouth qpm 2)  Combivent 103-18 Mcg/act Aero (Albuterol-ipratropium) .... Inhale 3 times daily 3)  Klor-con M20 20 Meq Tbcr (Potassium chloride crys cr) .... Take 1 tablet by mouth once a day 4)  Diovan 80 Mg Tabs (Valsartan) .... Take 1 tablet by mouth once a day for blood pressure 5)  Metformin Hcl 500 Mg Tabs (Metformin hcl) .... Take 1 tablet by mouth once a day for diabetes 6)  Ibuprofen 800 Mg Tabs (Ibuprofen) .... Take 1 tablet by mouth every 8 hours as needed for pain 7)  Methadose 10 Mg Tabs (Methadone hcl) .... 4 tablets  by mouth q 8 hours (dr.[facility]wa at [facility]) 8)  Fluoxetine Hcl 20 Mg Tabs (Fluoxetine hcl) .... Take 3 tablets by mouth once daily 9)  Glucose Meter of Choice  .... Check sugars once daily 10)  Glucose Meter Strips  .... Check sugar once a day 11)  Lancets  .... Check sugar once daily 12)  Nystatin [id number] Unit/gm Powd (Nystatin) .... Apply to rash two times a day until clear as needed  Hypertension Assessment/Plan:      The patient's hypertensive risk group is category C: Target organ damage and/or diabetes.  Today's blood  pressure is 170/109.  Her blood pressure goal is < 130/80.   Patient Instructions: 1)  Start Diovan (BP medicine) today. 2)  Return to see the nurse in 2 weeks for BMET and BP check (401.1). 3)  Please schedule a follow-up appointment in 6-8 weeks with Danyla Wattley for blood pressure.  Prescriptions: NYSTATIN 100000 UNIT/GM POWD (NYSTATIN) apply to rash two times a day until clear as needed  #30 grams x 3   Entered and Authorized by:   Tereso Newcomer PA-C   Signed by:   Tereso Newcomer PA-C on 09/01/2009   Method used:   Print then Give to Patient   RxID:   [id number]

## 2010-03-27 NOTE — Progress Notes (Signed)
Summary: PA approved for Diovan 80 mg. x1 year -- 08/21/09 - 08/21/10  Phone Note Other Incoming   Summary of Call: PA approved for Diovan 80 mg. x1 year -- 08/21/09 - 08/21/10 Initial call taken by: Dutch Quint RN,  August 30, 2009 3:55 PM     Appended Document: Pa approval Diovan 80 mg.    Clinical Lists Changes  PA approval for Diovan 80 mg. x1 year  08/21/09-08/20/09

## 2010-03-27 NOTE — Assessment & Plan Note (Signed)
Summary: renew medications/ gk   Vital Signs:  Patient profile:   55 year old female Height:      65 inches Weight:      310 pounds BMI:     51.77 Temp:     98.2 degrees F oral Pulse rate:   82 / minute Pulse rhythm:   regular Resp:     18 per minute BP sitting:   165 / 108  (left arm) Cuff size:   large  Vitals Entered By: Armenia Shannon (August 11, 2009 11:36 AM) CC: renew meds.... Is Patient Diabetic? Yes Pain Assessment Patient in pain? no      CBG Result 136  Does patient need assistance? Functional Status Self care Ambulation Normal   Primary Care Provider:  Tereso Newcomer, PA-C  CC:  renew meds.....  History of Present Illness: Here for f/u.  Previously followed by Dr. Barbaraann Barthel.  This is my first meeting with Joaquin Courts.  Pain:  Goes to [facility] for bilat. knee DJD and DDD in thoracic spine and lumbar spine.  Still on methadone.  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  Goes to pain clinic once a month.  Out of meds for a couple mos.  Needs everything refilled.  Depression:  Has tried to come off prozac in past but gets depressed without.  No suicidal thoughts.  Dyspnea:  Saw pulmonology in past and was dx with "emphysema."  Uses combivent.  States she worries that she has heart failure.  Echo in 2007 was tech diff but LV looked normal.  She states she feels like she is more short of breath without taking the Lasix.  She sleeps on an incline.  No PND.  No syncope.  No chest pain.  Habits & Providers  Alcohol-Tobacco-Diet     Tobacco Status: current  Exercise-Depression-Behavior     Drug Use: no  Problems Prior to Update: 1)  Preventive Health Care  (ICD-V70.0) 2)  Edema  (ICD-782.3) 3)  Dyspnea  (ICD-786.05) 4)  COPD  (ICD-496) 5)  Depression  (ICD-311) 6)  Chronic Pain Syndrome  (ICD-338.4) 7)  Proteinuria  (ICD-791.0) 8)  Peripheral Vascular Disease  (ICD-443.9) 9)  Hyperlipidemia Nec/nos   (ICD-272.4) 10)  Osteoarthritis, Knees, Bilateral  (ICD-715.96) 11)  Morbid Obesity  (ICD-278.01) 12)  Hypertension  (ICD-401.9) 13)  Hepatitis C  (ICD-070.51) 14)  Diabetes Mellitus, Type II  (ICD-250.00) 15)  Polyneuropathy  (ICD-357.9) 16)  Obstructive Sleep Apnea  (ICD-327.23)  Current Medications (verified): 1)  Furosemide 40 Mg Tabs (Furosemide) .... Take 2 Tablets By Mouth Qam and 1 Tab By Mouth Qpm 2)  Combivent 103-18 Mcg/act  Aero (Albuterol-Ipratropium) .... Inhale 3 Times Daily 3)  Klor-Con M20 20 Meq  Tbcr (Potassium Chloride Crys Cr) .... Take 1 Tablet By Mouth Once A Day 4)  Avapro 300 Mg  Tabs (Irbesartan) .... Take 1 Tablet By Mouth Every Morning 5)  Prevacid 30 Mg  Cpdr (Lansoprazole) .... Take 1 Tablet By Mouth Once A Day 6)  Glucophage Xr 500 Mg  Tb24 (Metformin Hcl) .... Ake 2 Tablets By Mouth  Every Morning 7)  Ibuprofen 800 Mg Tabs (Ibuprofen) .... Take 1 Tablet By Mouth Every 8 Hours As Needed For Pain 8)  Methocarbamol 750 Mg  Tabs (Methocarbamol) .Marland Kitchen.. 1 By Mouth Three Times A Day (Dr.dakwa) 9)  Methadose 10 Mg  Tabs (Methadone Hcl) .... 4 Tablets  By Mouth Q 8 Hours (  Dr.dakwa At [facility]) 10)  Crestor 20 Mg  Tabs (Rosuvastatin Calcium) .... Take 1 Tablet By Mouth Once A Day 11)  Truetrack Test   Strp (Glucose Blood) .... Check Blood Sugar Two Times A Day 12)  Norvasc 10 Mg  Tabs (Amlodipine Besylate) .... Take 1 Tablet By Mouth Every Morning For High Blood Pressure 13)  Fluoxetine Hcl 20 Mg  Tabs (Fluoxetine Hcl) .... Take 3 Tablets By Mouth Once Daily  Allergies (verified): 1)  ! Ace Inhibitors  Past History:  Past Medical History: HYPERTENSION (ICD-401.9) HEPATITIS C (ICD-070.51)   a.  diagnosed years ago   b.  never offered treatment DIABETES MELLITUS, TYPE II (ICD-250.00) POLYNEUROPATHY (ICD-357.9) OBSTRUCTIVE SLEEP APNEA (ICD-327.23) OSTEOARTHRITIS, KNEES, BILATERAL (ICD-715.96) MORBID OBESITY (ICD-278.01) METHADONE  DEPENDENCE MEDIASTINAL and BIHILAR ADENOPATHY. chest ct 2006. see Dr.Gonzalez note. Depression COPD Hyperlipidemia DJD Thoracic and Lumbar DDD  Past Surgical History: R breast lumpectomy. Benign.DX 1982 Cholecystectomy;10/88 Tubal ligation submandibular gland stone removed  Family History: adopted  Social History: lives with husband and son.H Has adult married daughter. Alcohol use-no Married Current Smoker Drug use-no Drug Use:  no  Physical Exam  General:  alert, well-developed, and well-nourished.   Head:  normocephalic and atraumatic.   Neck:  no jvd at 90 degrees Lungs:  normal breath sounds, no crackles, and no wheezes.   Heart:  normal rate and regular rhythm.   Abdomen:  soft.   Extremities:  1+ left pedal edema and 1+ right pedal edema.   chronic stasis changes noted  Neurologic:  alert & oriented X3 and cranial nerves II-XII intact.   Psych:  normally interactive and good eye contact.     Impression & Recommendations:  Problem # 1:  HYPERTENSION (ICD-401.9)  previously on Avapro, Lasix and Norvasc start back on Lasix as she has significant peripheral edema start back on Diovan (preferred with medicaid) recheck BP at f/u and decide +/- Norvasc check bmet at f/u   The following medications were removed from the medication list:    Norvasc 10 Mg Tabs (Amlodipine besylate) .Marland Kitchen... Take 1 tablet by mouth every morning for high blood pressure Her updated medication list for this problem includes:    Furosemide 40 Mg Tabs (Furosemide) .Marland Kitchen... Take 2 tablets by mouth qam and 1 tab by mouth qpm    Diovan 80 Mg Tabs (Valsartan) .Marland Kitchen... Take 1 tablet by mouth once a day for blood pressure  Orders: T-Comprehensive Metabolic Panel ([id number]) T-CBC w/Diff (78469-62952) T-Urinalysis (84132-44010)  Problem # 2:  DIABETES MELLITUS, TYPE II (ICD-250.00)  check A1C today . . . 7.2 has a hard time with Glucophage as it causes significant diarrhea was taking  XR change to IR and take once daily if still has SE's, change to sulfonylurea   Her updated medication list for this problem includes:    Diovan 80 Mg Tabs (Valsartan) .Marland Kitchen... Take 1 tablet by mouth once a day for blood pressure    Metformin Hcl 500 Mg Tabs (Metformin hcl) .Marland Kitchen... Take 1 tablet by mouth once a day for diabetes  Orders: T-Comprehensive Metabolic Panel (27253-66440) T-Urine Microalbumin w/creat. ratio ([phone])  Problem # 3:  COPD (ICD-496) rec she d/c cigs refill combivent  Her updated medication list for this problem includes:    Combivent 103-18 Mcg/act Aero (Albuterol-ipratropium) ..... Inhale 3 times daily  Problem # 4:  CHRONIC PAIN SYNDROME (ICD-338.4) followed at pain clinic  Problem # 5:  DEPRESSION (ICD-311) restart prozac with close f/u with  me has tried to stop in the past denies suicidal thoughts   Her updated medication list for this problem includes:    Fluoxetine Hcl 20 Mg Tabs (Fluoxetine hcl) .Marland Kitchen... Take 3 tablets by mouth once daily  Problem # 6:  HEPATITIS C (ICD-070.51)  never offered treatment check viral load and genotype  discuss in f/u whether she wants to go for tx  Orders: T-Comprehensive Metabolic Panel [phone]) T-CBC w/Diff [phone]) T-Hepatitis A Antibody (29562-13086) T-Hepatitis B Core Antibody (57846-96295) T-Hepatitis B Surface Antibody (28413-24401) T-Hepatitis B Surface Antigen (02725-36644) T-Hepatitis C Antibody (03474-25956) T-Hepatitis C Viral Load (38756-43329)  Problem # 7:  HYPERLIPIDEMIA NEC/NOS (ICD-272.4)  stopped crestor on her own due to ? cough repeat FLP today consider pravastatin  The following medications were removed from the medication list:    Crestor 20 Mg Tabs (Rosuvastatin calcium) .Marland Kitchen... Take 1 tablet by mouth once a day  Orders: T-Comprehensive Metabolic Panel [phone]) T-Lipid Profile (30160-10932)  Problem # 8:  EDEMA (ICD-782.3)  chronic refill  furosemide  Her updated medication list for this problem includes:    Furosemide 40 Mg Tabs (Furosemide) .Marland Kitchen... Take 2 tablets by mouth qam and 1 tab by mouth qpm  Orders: T-CBC w/Diff (35573-22025) T-TSH [phone]) T-BNP  (B Natriuretic Peptide) (83151-76160)  Problem # 9:  DYSPNEA (ICD-786.05)  notes DOE prob NYHA class 3 suspect most related to obesity and chronic pain she had an echo in 2007 that was tech difficult and she had a "normal LV" by report lungs are clear and she has no JVD at 90 degrees check BNP and consider f/u echo if elevated  Orders: T-BNP  (B Natriuretic Peptide) (73710-62694)  Problem # 10:  PREVENTIVE HEALTH CARE (ICD-V70.0) schedle CPP eventually  Orders: T-HIV Antibody  (Reflex) (85462-70350)  Complete Medication List: 1)  Furosemide 40 Mg Tabs (Furosemide) .... Take 2 tablets by mouth qam and 1 tab by mouth qpm 2)  Combivent 103-18 Mcg/act Aero (Albuterol-ipratropium) .... Inhale 3 times daily 3)  Klor-con M20 20 Meq Tbcr (Potassium chloride crys cr) .... Take 1 tablet by mouth once a day 4)  Diovan 80 Mg Tabs (Valsartan) .... Take 1 tablet by mouth once a day for blood pressure 5)  Metformin Hcl 500 Mg Tabs (Metformin hcl) .... Take 1 tablet by mouth once a day for diabetes 6)  Ibuprofen 800 Mg Tabs (Ibuprofen) .... Take 1 tablet by mouth every 8 hours as needed for pain 7)  Methadose 10 Mg Tabs (Methadone hcl) .... 4 tablets  by mouth q 8 hours (dr.dakwa at [facility]) 8)  Fluoxetine Hcl 20 Mg Tabs (Fluoxetine hcl) .... Take 3 tablets by mouth once daily 9)  Glucose Meter of Choice  .... Check sugars once daily 10)  Glucose Meter Strips  .... Check sugar once a day 11)  Lancets  .... Check sugar once daily  Patient Instructions: 1)  Start back on Furosemide and Potassium. 2)  I have changed you to Diovan instead of Avapro.  This is preferred with Medicaid. 3)  Start back on the Prozac (fluoxetine). 4)  I have changed you  to from Glucophage XR to Metformin.  Take with food once a day for diabetes. 5)  Schedule follow up with Lamario Mani in 2-3 weeks for blood pressure and depression. 6)  Tobacco is very bad for your health and your loved ones ! You should stop smoking !  7)  Stop smoking tips: Choose a quit date. Cut down before the  quit date. Decide what you will do as a substitute when you feel the urge to smoke(gum, toothpick, exercise).  Prescriptions: LANCETS check sugar once daily  #1 mo supply x 11   Entered and Authorized by:   Tereso Newcomer PA-C   Signed by:   Tereso Newcomer PA-C on 08/11/2009   Method used:   Print then Give to Patient   RxID:   [id number] GLUCOSE METER STRIPS check sugar once a day  #1 mo supply x 11   Entered and Authorized by:   Tereso Newcomer PA-C   Signed by:   Tereso Newcomer PA-C on 08/11/2009   Method used:   Print then Give to Patient   RxID:   [id number] GLUCOSE METER OF CHOICE check sugars once daily  #1 x 0   Entered and Authorized by:   Tereso Newcomer PA-C   Signed by:   Tereso Newcomer PA-C on 08/11/2009   Method used:   Print then Give to Patient   RxID:   [id number] FLUOXETINE HCL 20 MG  TABS (FLUOXETINE HCL) Take 3 tablets by mouth once daily  #90 x 3   Entered and Authorized by:   Tereso Newcomer PA-C   Signed by:   Tereso Newcomer PA-C on 08/11/2009   Method used:   Electronically to        [facility]  [address] [phone]* (retail)       [address]       [place], [place]  [address]       Ph: [phone] or [phone]       Fax: [phone]   RxID:   [id number] DIOVAN 80 MG TABS (VALSARTAN) Take 1 tablet by mouth once a day for blood pressure  #30 x 5   Entered and Authorized by:   Tereso Newcomer PA-C   Signed by:   Tereso Newcomer PA-C on 08/11/2009   Method used:   Electronically to        [facility]  [address] [phone]* (retail)       [address]       [place], [place]  [address]       Ph:  [phone] or [phone]       Fax: [phone]   RxID:   [id number] KLOR-CON M20 20 MEQ  TBCR (POTASSIUM CHLORIDE CRYS CR) Take 1 tablet by mouth once a day  #30 Tablet x 5   Entered and Authorized by:   Tereso Newcomer PA-C   Signed by:   Tereso Newcomer PA-C on 08/11/2009   Method used:   [facility]ronically to        [facility] [phone]* (retail)       [address]       [place], [place]  [address]       Ph: [phone] or [phone]       Fax: [phone]   RxID:   [id number] COMBIVENT 103-18 MCG/ACT  AERO (ALBUTEROL-IPRATROPIUM) Inhale 3 times daily  #1 x 11   Entered and Authorized by:   Tereso Newcomer PA-C   Signed by:   Tereso Newcomer PA-C on 08/11/2009   Method used:   Electronically to        [facility] [phone]* (retail)       [address]  [place], [place]  [address]       Ph: [phone] or [phone]       Fax: [phone]   RxID:   [id number] FUROSEMIDE 40 MG TABS (FUROSEMIDE) take 2 tablets by mouth qam and 1 tab by mouth qpm  #90 x 5   Entered and Authorized by:   Tereso Newcomer PA-C   Signed by:   Tereso Newcomer PA-C on 08/11/2009   Method used:   [facility]ronically to        [facility] [phone]* (retail)       [address]       [place], [place]  [address]       Ph: [phone] or [phone]       Fax: [phone]   RxID:   [id number] METFORMIN HCL 500 MG TABS (METFORMIN HCL) Take 1 tablet by mouth once a day for diabetes  #30 x 5   Entered and Authorized by:   Tereso Newcomer PA-C   Signed by:   Tereso Newcomer PA-C on 08/11/2009   Method used:   Electronically to        [facility] [phone]* (retail)       [address]       [place], [place]  [address]       Ph: [phone] or [phone]       Fax: [phone]   RxID:   [id number]   Laboratory Results   Blood Tests     HGBA1C: 7.2%    (Normal Range: Non-Diabetic - 3-6%   Control Diabetic - 6-8%) CBG Random:: 136mg /dL

## 2010-03-27 NOTE — Letter (Signed)
Summary: *HSN Results Follow up  [facility]  [address] [place], [place] [address]   Phone: [phone]  Fax: [phone]      09/04/2009   [name] [address] [place], [place]  [address]   Dear  Ms. [name],                            ____S.Drinkard,FNP   ____D. Gore,FNP       ____B. McPherson,MD   ____V. Rankins,MD    ____E. Mulberry,MD    ____N. Daphine Deutscher, FNP  ____D. Reche Dixon, MD    ____K. Philipp Deputy, MD    __x__S. Alben Spittle, PA-C     This letter is to inform you that your recent test(s):  _______Pap Smear    ___x____Lab Test     _______X-ray    ___x____ is within acceptable limits  _______ requires a medication change  _______ requires a follow-up lab visit  _______ requires a follow-up visit with your provider   Comments:       _________________________________________________________ If you have any questions, please contact our office                     Sincerely,  Tereso Newcomer PA-C [facility]

## 2010-03-29 NOTE — Letter (Signed)
[facility]   Imported By: Arta Bruce 03/22/2010 13:47:36  _____________________________________________________________________  External Attachment:    Type:   Image     Comment:   External Document

## 2010-03-29 NOTE — Letter (Signed)
Summary: [facility]   Imported By: Arta Bruce 02/20/2010 16:35:47  _____________________________________________________________________  External Attachment:    Type:   Image     Comment:   External Document

## 2010-07-13 NOTE — Discharge Summary (Signed)
[name], [name]                  ACCOUNT NO.:  [id number]   MEDICAL RECORD NO.:  [id number]          PATIENT TYPE:  INP   LOCATION:  5033                         FACILITY:  [facility]   PHYSICIAN:  Madaline Guthrie, M.D.    DATE OF BIRTH:  02-20-1956   DATE OF ADMISSION:  08/23/2005  DATE OF DISCHARGE:  08/29/2005                                 DISCHARGE SUMMARY   DISCHARGE DIAGNOSIS:  Main complaint was of facial cellulitis or impetigo  versus adverse reaction to Keflex.   OTHER DIAGNOSES:  1. Venostasis ulcer on left leg.  2. Adverse drug reaction from Keflex.  3. Contact dermatitis on bilateral hands.  4. Osteoarthritis of the knees.  5. COPD.  6. Obstructive sleep apnea.  7. Methadone dependence.  8. Hepatitis C.  9. Polyneuropathy.  10.Obesity.  11.Tobacco abuse.  12.Type 2 diabetes.  13.Hyperlipidemia.   DISCHARGE MEDICATIONS:  1. Clindamycin 500 mg b.i.d. for 7 days.  2. Metformin 500 mg b.i.d.  3. Advair inhaler 2 puffs q. 12 hours.  4. Avapro daily.  5. Combivent 2 puffs t.i.d.  6. Lasix 40 mg b.i.d.  7. Neurontin 600 mg t.i.d.  8. Norvasc 5 mg daily.  9. Potassium chloride 10 mEq p.o. daily.  10.Prozac 60 mg daily.  11.Methadone 79 mg daily.   CONDITION ON DISCHARGE:  Guarded.  The patient will followup with  [facility] where her primary care physician is, Dr. Barbaraann Barthel.  Her followup  is scheduled for September 16, 2005 at 9:15 a.m.  This appointment has already  been scheduled for the patient and she has been notified of it.   PROCEDURES:  1. Echocardiogram.  2. Culture of wound which showed no growth.  3. Zinc smear of lesions on hand which returned negative.   CONSULTATIONS:  Include wound care management consult and diabetes education  consult.   ADMITTING HISTORY AND PHYSICAL:  This is a 55 year old white obese female  presenting with past medical history significant for obstructive sleep  apnea, COPD and diabetes type 2 as well as several other diagnoses  mentioned  above.  The patient presented with facial and neck swelling with  erythematous areas.  One day prior to admission the patient presented to the  emergency department here with this facial swelling which was weeping  erythematous and decimating and the patient was given Keflex and sent home.  However, her condition did not improve and the swelling or neck edema  expanded as well as the facial swelling and the erythema became worse and  the patient returned to the ED the subsequent day.  The patient has never  had anything like this before and only noticed this condition one day prior  to the first ED visit.   The patient also presented with various shapes and sizes of bola  interdigitinous which was a new presentation on the time of admission.   Also when the patient was admitted, we noticed a wound on her left leg above  the ankle on the anterior side of her leg.  The wound was opened and had an  erythematous  border as well as it was weeping profusely.  It should be noted  at this time that the patient does have a history of polyneuropathy in her  legs which may be due to her diabetes.  She has also had a leg ulcer of  different presentation one year previously on her right leg, the opposite  leg.   The swelling in the patient's neck was quite remarkable and was slightly  indurated and warm to touch.  However, the patient did not have any  respiratory compromise at the time of admission.   HOSPITAL COURSE:  The patient was admitted to a regular bed and put on  contact precautions for a possible strep or staph infection due to the fact  that the lesion on her face resembled a cellulitis.  The patient was also  placed on vancomycin IV as well as clindamycin IV as well as Protonix for GI  prophylaxis and sliding scale insulin with NovoLog and Lantus 4 units  nightly.   We continued to observe the patient while she was on the vancomycin and  clindamycin and the rash slowly  started to improve.  It was easy to see that  the area of erythema was decreasing.  The declination was decreasing over  time and also the pruritus of the rash was becoming more and more tolerant  to the patient.  By the time of discharge, the patient's rash on her face  had almost completely resolved.  We attributed this to either a possible  cellulitis or impetigo for which we were adequately treating either strep or  staph with the vancomycin and clindamycin or the fact that the patient  received Keflex 1 day prior to admission which did not heal the rash and  during the time she was receiving Keflex, the rash actually worsened so it  could have been due to a drug reaction as well.   The bullus lesions on both of the patient's hands between her fingers,  several were aspirated and sent for zinc smears to check for herpes however  these returned negative.  After reviewing the pattern and appearance of  these lesions, it is more consistent with a type of contact dermatitis  between the fingers or may have been due to a possible drug reaction from  Keflex.   The wound on the patient's left leg slightly above her ankle and on the  anterior part of the leg has a similar differential as to her facial  cellulitis/drug reaction.  We think this leg wound may have also been due to  a type of cellulitis and had responded to the antibiotics we were giving  her.  It is also possible that it was due to the drug reaction from the  Keflex.  We believe that this wound was not healing as fast as the face  simply because of the poor circulation to her legs and coexisting  polyneuropathy most likely due to her diabetes.  Nonetheless, by the time of  the discharge, the wound had significantly improved.  It was no longer  weeping, was dry.  The erythematous border was markedly decreased and we  felt that it would respond well to oral clindamycin that the patient could  take at home.  DISCHARGE LABS AND  VITALS:  On the day of discharge include a CBC, white  blood cell count 7.5, RBC 4.7, hemoglobin 14.3, hematocrit 41.8, platelets  220.  Sodium 138, potassium 3.2, chloride 102, glucose 131, BUN 14,  creatinine 1.1, calcium 8.7.  The patient's echocardiogram was read as  normal except for calcifications in an annular pattern on the mitral valve.  The patient received this echocardiogram for new diagnosis of systolic  murmur.  Also the patient's facial wound was cultured and did not return a  positive culture, however gram stain revealed rare gram positive cocci in  pairs.  Anaerobic culture as well as blood culture did not return positive.  The patient's hemoglobin A1c during admission was 8.2, TSH was 1.1, UDS  negative.  CT of the neck and face returned with an impression of findings  compatible with facial cellulitis, no definite abscess.  UA was negative  for infection.  The patient's CBGs ranged from 108 up into the 150s as well  as an outlying value of 233 during her stay.  There are no pending labs at  the time of discharge.      Thereasa Solo, M.D.  Electronically Signed      Madaline Guthrie, M.D.  Electronically Signed    AS/MEDQ  D:  08/29/2005  T:  08/29/2005  Job:  [id number]   cc:   Fanny Dance. Rankins, M.D.

## 2010-07-13 NOTE — Op Note (Signed)
[place]  Patient:    [name], [name] Visit Number: [id number] MRN: [id number]          Service Type: DSU Location: [facility] Attending Physician:  Cornell Barman Dictated by:   Lenard Galloway Chaney Malling, M.D. Proc. Date: 06/11/01 Admit Date:  06/11/2001                             Operative Report  PREOPERATIVE DIAGNOSES: 1. Cellulitis versus abscess. 2. Postoperative wound infection, left hand.  POSTOPERATIVE DIAGNOSES: 1. Cellulitis versus abscess. 2. Postoperative wound infection, left hand.  OPERATION:  Irrigation and debridement of wound volar aspect of left wrist with synovectomy of all flexor tendon sheaths, left wrist, and insertion of Penrose drain.  SURGEON:  Lenard Galloway. Chaney Malling, M.D.  ANESTHESIA:  General.  DESCRIPTION OF PROCEDURE:  The patient was placed on the operating room table in the supine position.  Pneumatic tourniquet applied to the left upper arm. The left upper extremity was then prepped with Duraprep and draped down in the usual manner.  The arm was then wrapped in an Esmarch and tourniquet was elevated.  The area of previous incision was opened.  This was all done under loop magnification.  Great care was taken to avoid injury to the median nerve. Retractor was placed in the wound, and the wound was opened wide up.  There was inflammation about the flexor tendon sheath and adjacent soft tissues with edema, but no obvious abscess formation could be seen.  All the tissue planes were dissected with a finger and blunt dissection, and no abscess was noted. Cultures had been taken previously, and at 24 hours show no growth.  There was synovitis about the flexor tendons, and each of the flexor sheaths were individual elevated and the flexor synovium excised.  No abscess was seen in any of the flexor sheaths.  Inspection of each sheath and synovectomy including the FPL was done.  The hand was very carefully examined for  other source of infection, no other abnormality was noted.  Throughout the procedure, the hand was irrigated with copious amounts of saline solution.  A 3-0 nylon suture was then used to close the distal half of the carpal tunnel incision, the proximal half was left wide open with retention sutures in place to be closed at a later time.  A small Penrose drain was then placed in the wound.  Bulky sterile dressings were applied, and the patient returned to the recovery room in excellent condition.  Technically, this went extremely well. Dictated by:   Lenard Galloway Chaney Malling, M.D. Attending Physician:  Cornell Barman DD:  06/11/01 TD:  06/12/01 Job: [id number]

## 2010-07-13 NOTE — Procedures (Signed)
[name], [name] NO.:  [id number]   MEDICAL RECORD NO.:  [id number]          PATIENT TYPE:  OUT   LOCATION:  SLEEP CENTER                 FACILITY:  [facility]   PHYSICIAN:  Marcelyn Bruins, M.D. [facility] DATE OF BIRTH:  Jun 24, 1955   DATE OF STUDY:  11/17/2004                              NOCTURNAL POLYSOMNOGRAM   REFERRING PHYSICIAN:  Dr. Danice Goltz   INDICATION FOR THE STUDY:  Hypersomnia with sleep apnea.   EPWORTH SCORE:  13   SLEEP ARCHITECTURE:  The patient had a total sleep time of 389 minutes with  decreased REM and slow wave sleep. Sleep onset latency was normal as was REM  onset. Sleep efficiency was 85%.   RESPIRATORY DATA:  The patient was found to have obstructive apneas and  hypopneas for a respiratory disturbance index of 12 events per hour. These  events were not positional but they were associated with loud snoring.   OXYGEN DATA:  The patient had O2 desaturation as low as 69% during her  obstructive events that occurred during REM. The patient also had mild O2  desaturations into the middle 80s that were not associated with obstructive  events. Oxygen at 1 L was added for these particular occurrences.   CARDIAC DATA:  No clinically significant cardiac arrhythmia.   MOVEMENT/PARASOMNIAS:  None.   IMPRESSION:  1.  Mild obstructive sleep apnea/hypopnea syndrome with a respiratory      disturbance index of 12 events per hour and O2 desaturation to 69%      primarily with a REM. Treatment for this degree of sleep apnea may      include weight loss if appropriate, upper airway surgery, oral      appliance, or CPAP. Clinical correlation is suggested.  2.  Mild oxygen desaturation not associated with obstructive events. This      could be secondary to hypoventilation or possibly underlying lung      disease.           ______________________________  Marcelyn Bruins, M.D. [facility]  Diplomate, American Board of Sleep  Medicine    KC/MEDQ  D:   11/29/2004 12:17:01  T:  11/29/2004 13:52:01  Job:  [id number]

## 2010-11-14 LAB — I-STAT 8, (EC8 V) (CONVERTED LAB)
Acid-Base Excess: 5 — ABNORMAL HIGH
BUN: 9
Chloride: 106
Potassium: 3.3 — ABNORMAL LOW
pCO2, Ven: 36 — ABNORMAL LOW
pH, Ven: 7.504 — ABNORMAL HIGH

## 2010-11-14 LAB — POCT I-STAT CREATININE
Creatinine, Ser: 0.9
Operator id: 257131

## 2012-08-05 ENCOUNTER — Encounter (HOSPITAL_COMMUNITY): Payer: Self-pay | Admitting: Radiology

## 2012-08-05 ENCOUNTER — Inpatient Hospital Stay (HOSPITAL_COMMUNITY)
Admission: EM | Admit: 2012-08-05 | Discharge: 2012-08-10 | DRG: 872 | Disposition: A | Discharge status: Home / Self Care | Payer: Medicaid Other | Attending: Internal Medicine | Admitting: Internal Medicine

## 2012-08-05 ENCOUNTER — Emergency Department (HOSPITAL_COMMUNITY): Payer: Medicaid Other

## 2012-08-05 DIAGNOSIS — N390 Urinary tract infection, site not specified: Secondary | ICD-10-CM | POA: Diagnosis present

## 2012-08-05 DIAGNOSIS — E119 Type 2 diabetes mellitus without complications: Secondary | ICD-10-CM | POA: Diagnosis present

## 2012-08-05 DIAGNOSIS — A419 Sepsis, unspecified organism: Principal | ICD-10-CM | POA: Diagnosis present

## 2012-08-05 DIAGNOSIS — Z720 Tobacco use: Secondary | ICD-10-CM

## 2012-08-05 DIAGNOSIS — L03119 Cellulitis of unspecified part of limb: Secondary | ICD-10-CM | POA: Diagnosis present

## 2012-08-05 DIAGNOSIS — E876 Hypokalemia: Secondary | ICD-10-CM | POA: Diagnosis present

## 2012-08-05 DIAGNOSIS — F17200 Nicotine dependence, unspecified, uncomplicated: Secondary | ICD-10-CM | POA: Diagnosis present

## 2012-08-05 DIAGNOSIS — I872 Venous insufficiency (chronic) (peripheral): Secondary | ICD-10-CM | POA: Diagnosis present

## 2012-08-05 DIAGNOSIS — L02419 Cutaneous abscess of limb, unspecified: Secondary | ICD-10-CM | POA: Diagnosis present

## 2012-08-05 DIAGNOSIS — J449 Chronic obstructive pulmonary disease, unspecified: Secondary | ICD-10-CM

## 2012-08-05 DIAGNOSIS — J4489 Other specified chronic obstructive pulmonary disease: Secondary | ICD-10-CM | POA: Diagnosis present

## 2012-08-05 DIAGNOSIS — I10 Essential (primary) hypertension: Secondary | ICD-10-CM | POA: Diagnosis present

## 2012-08-05 DIAGNOSIS — G894 Chronic pain syndrome: Secondary | ICD-10-CM | POA: Diagnosis present

## 2012-08-05 DIAGNOSIS — R0602 Shortness of breath: Secondary | ICD-10-CM

## 2012-08-05 DIAGNOSIS — R6510 Systemic inflammatory response syndrome (SIRS) of non-infectious origin without acute organ dysfunction: Secondary | ICD-10-CM | POA: Diagnosis present

## 2012-08-05 DIAGNOSIS — Z87891 Personal history of nicotine dependence: Secondary | ICD-10-CM | POA: Diagnosis present

## 2012-08-05 DIAGNOSIS — L97909 Non-pressure chronic ulcer of unspecified part of unspecified lower leg with unspecified severity: Secondary | ICD-10-CM | POA: Diagnosis present

## 2012-08-05 HISTORY — DX: Type 2 diabetes mellitus without complications: E11.9

## 2012-08-05 HISTORY — DX: Unspecified osteoarthritis, unspecified site: M19.90

## 2012-08-05 HISTORY — DX: Major depressive disorder, single episode, unspecified: F32.9

## 2012-08-05 HISTORY — DX: Chronic obstructive pulmonary disease, unspecified: J44.9

## 2012-08-05 HISTORY — DX: Essential (primary) hypertension: I10

## 2012-08-05 HISTORY — DX: Shortness of breath: R06.02

## 2012-08-05 HISTORY — DX: Depression, unspecified: F32.A

## 2012-08-05 LAB — RAPID URINE DRUG SCREEN, HOSP PERFORMED
Amphetamines: NOT DETECTED
Barbiturates: NOT DETECTED
Benzodiazepines: NOT DETECTED
Tetrahydrocannabinol: NOT DETECTED

## 2012-08-05 LAB — CBC WITH DIFFERENTIAL/PLATELET
Basophils Absolute: 0 10*3/uL (ref 0.0–0.1)
HCT: 46.9 % — ABNORMAL HIGH (ref 36.0–46.0)
Lymphocytes Relative: 4 % — ABNORMAL LOW (ref 12–46)
Lymphs Abs: 0.7 10*3/uL (ref 0.7–4.0)
Monocytes Absolute: 0.6 10*3/uL (ref 0.1–1.0)
Neutro Abs: 14.8 10*3/uL — ABNORMAL HIGH (ref 1.7–7.7)
Platelets: 178 10*3/uL (ref 150–400)
RBC: 5.55 MIL/uL — ABNORMAL HIGH (ref 3.87–5.11)
RDW: 12.8 % (ref 11.5–15.5)
WBC: 16.2 10*3/uL — ABNORMAL HIGH (ref 4.0–10.5)

## 2012-08-05 LAB — BASIC METABOLIC PANEL
BUN: 12 mg/dL (ref 6–23)
Chloride: 97 mEq/L (ref 96–112)
Creatinine, Ser: 0.93 mg/dL (ref 0.50–1.10)
GFR calc Af Amer: 79 mL/min — ABNORMAL LOW (ref >90)

## 2012-08-05 LAB — URINE MICROSCOPIC-ADD ON

## 2012-08-05 LAB — HEPATIC FUNCTION PANEL
ALT: 26 U/L (ref 0–35)
AST: 32 U/L (ref 0–37)
Bilirubin, Direct: 0.1 mg/dL (ref 0.0–0.3)
Total Bilirubin: 0.5 mg/dL (ref 0.3–1.2)

## 2012-08-05 LAB — URINALYSIS, ROUTINE W REFLEX MICROSCOPIC
Bilirubin Urine: NEGATIVE
Glucose, UA: 500 mg/dL — AB
Ketones, ur: NEGATIVE mg/dL
pH: 5.5 (ref 5.0–8.0)

## 2012-08-05 LAB — GLUCOSE, CAPILLARY

## 2012-08-05 LAB — CK: Total CK: 94 U/L (ref 7–177)

## 2012-08-05 MED ORDER — ONDANSETRON HCL 4 MG/2ML IJ SOLN: 4.0000 mg | Freq: Four times a day (QID) | INTRAMUSCULAR | Status: DC | PRN

## 2012-08-05 MED ORDER — METHADONE HCL 10 MG PO TABS
40.0000 mg | ORAL_TABLET | Freq: Three times a day (TID) | ORAL | Status: DC
  Administered 2012-08-06 – 2012-08-10 (×14): 40 mg via ORAL
  Filled 2012-08-05 (×14): qty 4

## 2012-08-05 MED ORDER — ACETAMINOPHEN 650 MG RE SUPP: 650.0000 mg | Freq: Four times a day (QID) | RECTAL | Status: DC | PRN

## 2012-08-05 MED ORDER — PNEUMOCOCCAL VAC POLYVALENT 25 MCG/0.5ML IJ INJ
0.5000 mL | INJECTION | INTRAMUSCULAR | Status: AC
  Administered 2012-08-06: 0.5 mL via INTRAMUSCULAR
  Filled 2012-08-05: qty 0.5

## 2012-08-05 MED ORDER — ONDANSETRON 4 MG PO TBDP
4.0000 mg | ORAL_TABLET | Freq: Once | ORAL | Status: AC
  Administered 2012-08-05: 4 mg via ORAL
  Filled 2012-08-05: qty 1

## 2012-08-05 MED ORDER — ONDANSETRON HCL 4 MG PO TABS: 4.0000 mg | ORAL_TABLET | Freq: Four times a day (QID) | ORAL | Status: DC | PRN

## 2012-08-05 MED ORDER — PREGABALIN 50 MG PO CAPS
50.0000 mg | ORAL_CAPSULE | Freq: Three times a day (TID) | ORAL | Status: DC
  Administered 2012-08-06 – 2012-08-10 (×14): 50 mg via ORAL
  Filled 2012-08-05 (×14): qty 1

## 2012-08-05 MED ORDER — PIPERACILLIN-TAZOBACTAM 3.375 G IVPB
3.3750 g | Freq: Three times a day (TID) | INTRAVENOUS | Status: DC
  Administered 2012-08-06 – 2012-08-08 (×8): 3.375 g via INTRAVENOUS
  Filled 2012-08-05 (×10): qty 50

## 2012-08-05 MED ORDER — CEFTRIAXONE SODIUM 1 G IJ SOLR
1.0000 g | Freq: Once | INTRAMUSCULAR | Status: AC
  Administered 2012-08-05: 1 g via INTRAVENOUS
  Filled 2012-08-05: qty 10

## 2012-08-05 MED ORDER — SODIUM CHLORIDE 0.9 % IV SOLN
INTRAVENOUS | Status: AC
  Administered 2012-08-06 (×2): via INTRAVENOUS

## 2012-08-05 MED ORDER — PIPERACILLIN-TAZOBACTAM 3.375 G IVPB 30 MIN
3.3750 g | Freq: Once | INTRAVENOUS | Status: AC
  Administered 2012-08-05: 3.375 g via INTRAVENOUS
  Filled 2012-08-05: qty 50

## 2012-08-05 MED ORDER — SODIUM CHLORIDE 0.9 % IV BOLUS (SEPSIS)
500.0000 mL | Freq: Once | INTRAVENOUS | Status: AC
  Administered 2012-08-05: 500 mL via INTRAVENOUS

## 2012-08-05 MED ORDER — ALBUTEROL SULFATE (5 MG/ML) 0.5% IN NEBU
5.0000 mg | INHALATION_SOLUTION | Freq: Once | RESPIRATORY_TRACT | Status: AC
  Administered 2012-08-05: 5 mg via RESPIRATORY_TRACT
  Filled 2012-08-05: qty 1

## 2012-08-05 MED ORDER — SODIUM CHLORIDE 0.9 % IV BOLUS (SEPSIS)
1000.0000 mL | Freq: Once | INTRAVENOUS | Status: AC
  Administered 2012-08-05: 1000 mL via INTRAVENOUS

## 2012-08-05 MED ORDER — ACETAMINOPHEN 325 MG PO TABS
650.0000 mg | ORAL_TABLET | Freq: Once | ORAL | Status: AC
  Administered 2012-08-05: 650 mg via ORAL
  Filled 2012-08-05: qty 2

## 2012-08-05 MED ORDER — SODIUM CHLORIDE 0.9 % IJ SOLN
3.0000 mL | Freq: Two times a day (BID) | INTRAMUSCULAR | Status: DC
  Administered 2012-08-07 – 2012-08-10 (×2): 3 mL via INTRAVENOUS

## 2012-08-05 MED ORDER — IPRATROPIUM BROMIDE 0.02 % IN SOLN
0.5000 mg | Freq: Once | RESPIRATORY_TRACT | Status: AC
  Administered 2012-08-05: 0.5 mg via RESPIRATORY_TRACT
  Filled 2012-08-05: qty 2.5

## 2012-08-05 MED ORDER — ACETAMINOPHEN 325 MG PO TABS: 650.0000 mg | ORAL_TABLET | Freq: Four times a day (QID) | ORAL | Status: DC | PRN

## 2012-08-05 MED ORDER — INSULIN ASPART 100 UNIT/ML ~~LOC~~ SOLN
0.0000 [IU] | Freq: Three times a day (TID) | SUBCUTANEOUS | Status: DC
  Administered 2012-08-06: 2 [IU] via SUBCUTANEOUS

## 2012-08-05 MED ORDER — IOHEXOL 350 MG/ML SOLN
100.0000 mL | Freq: Once | INTRAVENOUS | Status: AC | PRN
  Administered 2012-08-05: 80 mL via INTRAVENOUS

## 2012-08-05 NOTE — H&P (Signed)
[facility] History and Physical  [name] WUX:[id number] DOB: 02/18/1956 DOA: 08/05/2012  Referring physician: ER physician. PCP: Tereso Newcomer, PA-C  Specialists: None.  Chief Complaint: Fever and shortness of breath.  HPI: [name] is a 57 y.o. female no history of diabetes mellitus type 2, hypertension, ongoing tobacco abuse, COPD started experiencing shortness of breath and fever chills over the last 2 days. Due to the symptoms being persistent patient presented to the ER. In the ER CT angiogram of the chest was negative for PE and UA was compatible with UTI. Patient has been in pretty start antibiotics and admitted for further management. Patient has been not taking her medications for last 2 years due to financial issues. Denies any chest pain nausea vomiting diarrhea. Has chronic back pain and is on chronic pain medications. Renal sonogram is pending to check for any nephritis and obstruction.  Review of Systems: As presented in the history of presenting illness, rest negative.  Past Medical History  Diagnosis Date  . Hypertension   . Diabetes mellitus without complication    Past Surgical History  Procedure Laterality Date  . Cholecystectomy    . Tubal ligation     Social History:  reports that she has been smoking.  She does not have any smokeless tobacco history on file. She reports that she does not drink alcohol or use illicit drugs. Home. where does patient live-- Can do ADLs Can patient participate in ADLs?  Allergies  Allergen Reactions  . Ace Inhibitors     REACTION: angioedema    Family History  Problem Relation Age of Onset  . Adopted: Yes     Prior to Admission medications   Medication Sig Start Date End Date Taking? Authorizing Provider  ibuprofen (ADVIL,MOTRIN) 200 MG tablet Take 400 mg by mouth every 6 (six) hours as needed for pain.   Yes Historical Provider, MD  methadone (DOLOPHINE) 10 MG tablet Take 40 mg by mouth 3 (three) times  daily.   Yes Historical Provider, MD  pregabalin (LYRICA) 50 MG capsule Take 50 mg by mouth 3 (three) times daily.   Yes Historical Provider, MD   Physical Exam: Filed Vitals:   08/05/12 1800 08/05/12 1830 08/05/12 1848 08/05/12 1947  BP: 99/51 115/62  152/86  Pulse: 104 102  97  Temp:   100 F (37.8 C) 99 F (37.2 C)  TempSrc:   Oral Oral  Resp: 12 10  19   SpO2: 93% 94%  95%     General:  Well-developed and nourished.  Eyes: Anicteric no pallor.  ENT: No discharge from the ears eyes nose mouth.  Neck: No mass felt.  Cardiovascular: S1-S2 heard tachycardic.  Respiratory: No rhonchi or crepitations.  Abdomen: Soft nontender bowel sounds present.  Skin: No rash.  Musculoskeletal: No edema.  Psychiatric: Appears normal.  Neurologic: Alert awake oriented to time place and person. Moves all extremities.  Labs on Admission:  Basic Metabolic Panel:  Recent Labs Lab 08/05/12 1458  NA 136  K 3.7  CL 97  CO2 27  GLUCOSE 303*  BUN 12  CREATININE 0.93  CALCIUM 8.8   Liver Function Tests:  Recent Labs Lab 08/05/12 1458  AST 32  ALT 26  ALKPHOS 113  BILITOT 0.5  PROT 7.6  ALBUMIN 4.0   No results found for this basename: LIPASE, AMYLASE,  in the last 168 hours No results found for this basename: AMMONIA,  in the last 168 hours CBC:  Recent Labs Lab  08/05/12 1357  WBC 16.2*  NEUTROABS 14.8*  HGB 16.8*  HCT 46.9*  MCV 84.5  PLT 178   Cardiac Enzymes: No results found for this basename: CKTOTAL, CKMB, CKMBINDEX, TROPONINI,  in the last 168 hours  BNP (last 3 results)  Recent Labs  08/05/12 1357  PROBNP 253.6*   CBG:  Recent Labs Lab 08/05/12 1404  GLUCAP 352*    Radiological Exams on Admission: Dg Chest 2 View  08/05/2012   *RADIOLOGY REPORT*  Clinical Data: Shortness of breath.  Chest pain and leg swelling  CHEST - 2 VIEW  Comparison: 08/27/2004  Findings: The heart size and mediastinal contours are within normal limits.  Both lungs  are clear.  The visualized skeletal structures are remarkable for mild spondylosis.  IMPRESSION: Negative exam.   Original Report Authenticated By: Signa Kell, M.D.   Ct Angio Chest W/cm &/or Wo Cm  08/05/2012   *RADIOLOGY REPORT*  Clinical Data: Shortness of breath.  Cough and fever.  CT ANGIOGRAPHY CHEST  Technique:  Multidetector CT imaging of the chest using the standard protocol during bolus administration of intravenous contrast. Multiplanar reconstructed images including MIPs were obtained and reviewed to evaluate the vascular anatomy.  Contrast: 80mL OMNIPAQUE IOHEXOL 350 MG/ML SOLN  Comparison: Two-view chest 08/05/2012.  CTA chest 08/08/2004.  Findings: Pulmonary arterial opacification is suboptimal.  No significant proximal emboli are present within the lobar arteries. Subsegmental vessels are incompletely evaluated.  Heart size is normal.  No significant mediastinal or axillary adenopathy is present.  There is no significant pleural or pericardial effusion.  Diffuse fatty infiltration of the liver is again noted.  Limited imaging of the abdomen is otherwise unremarkable.  A 5 mm nodule in the right middle lobe is not significantly changed.  Minimal atelectasis is present at both lung bases.  No other focal nodule, mass, or airspace disease is evident.  The bone windows demonstrate mild degenerative changes in the thoracic spine with slight rightward curvature.  IMPRESSION:  1.  No evidence of pulmonary embolus. 2.  Evaluation of distal subsegmental vessels is somewhat limited by a suboptimal contrast bolus. 3.  Stable 5 mm right middle lobe nodule. 4.  No acute or focal abnormality to explain the patient's symptoms otherwise. 5.  Mild degenerative changes of the thoracic spine.   Original Report Authenticated By: Marin Roberts, M.D.    EKG: Independently reviewed. Sinus tachycardia.  Assessment/Plan Principal Problem:   SIRS (systemic inflammatory response syndrome) Active Problems:    DIABETES MELLITUS, TYPE II   Chronic pain syndrome   HYPERTENSION   COPD   UTI (lower urinary tract infection)   Diabetes mellitus   Tobacco abuse   1. SIRS possible source could be UTI - continue on Zosyn and follow blood cultures and urine cultures. Check renal sonogram for the obstruction. Continue hydration. 2. Diabetes mellitus type 2 -patient has not been taking her medications for 2 years due to financial issues. Check hemoglobin A1c and sliding scale coverage for now. 3. Hypertension - patient has been placed on when necessary IV hydralazine for now. Closely follow blood pressure trends. 4. COPD - not wheezing presently. 5. Tobacco abuse - strongly advised to quit smoking. 6. Chronic pain - continue home medications.     Code Status:Full code.  Family Communication: None.  Disposition Plan: Admit to inpatient.    Osha Rane N. [facility] Pager [phone].  If 7PM-7AM, please contact night-coverage [email or web address] Password [facility] 08/05/2012, 9:19 PM

## 2012-08-05 NOTE — ED Notes (Signed)
Pt's CBG is 352 mg/dl.RN notified.

## 2012-08-05 NOTE — ED Provider Notes (Signed)
Care assumed from Marlon Pel, PA-C at shift change. Obtaining septic workup. Initial thought possible pneumonia or UTI. CXR unremarkable. Leukocytosis of 16.2. Awaiting UA, BMP, lactate, CXR. Probable admission. Getting IV fluids. 4:07 PM Patient has a UTI- nitrite positive, many bacteria, 11-20 WBC, rare squamous epithelial cells. Lactic acid 3.3. Blood cultures have been drawn, will give 1 g IV rocephin. On exam, patient is sitting comfortably on exam bed in NAD. Abdomen soft, NT, ND with normal bowel sounds. Positive CVA tenderness on R. Probable pyelonephritis. Heart tachycardia with regular rhythm. Lungs clear. No tachypnea. Will give another liter of fluids along with the rocephin, re-evaluate. I believe patient will be able to be discharged home rather than admitted. Case discussed with Dr. Jeraldine Loots who agrees with plan of care. 6:13 PM IV abx complete, however patient is beginning to feel worse. She is diaphoretic, BP 99/51. Will give another liter bolus of fluids and admit patient. 6:26 PM I spoke with Dr. Susie Cassette who is not impressed with the urine causing this patient's symptoms, would like a liver function panel and a CT angio rule out pulmonary embolus prior to admission. 8:34 PM CT negative for pulmonary embolus. Liver functions unremarkable. Admission accepted by Dr. Toniann Fail, [facility] team 10. CK, troponin, troponin-T, renal sonogram ordered per Dr. Toniann Fail.  Trevor Mace, PA-C 08/05/12 2035

## 2012-08-05 NOTE — ED Notes (Addendum)
Per ems pt from home with SOB and multi other complaints.  170/100 after 1 nitro sl, 4 zofran IV  130 st leg pain from cellulitous, , deminished bases wheeze top, smoker, Pt has copd diabetis, cbg 330.  Pt alert oriented X4

## 2012-08-05 NOTE — ED Provider Notes (Signed)
I was available throughout the completion of this patient's care.  Gerhard Munch, MD 08/05/12 [phone]

## 2012-08-05 NOTE — ED Provider Notes (Signed)
History     CSN: [id number]  Arrival date & time 08/05/12  1324   First MD Initiated Contact with Patient 08/05/12 1338      Chief Complaint  Patient presents with  . Shortness of Breath    (Consider location/radiation/quality/duration/timing/severity/associated sxs/prior treatment) HPI  [name] is a 57 y.o.female presenting to the ER with complaints of fever, weakness, cough, dysuria for over 24 hours. She has a PMH + for diabetes, CHF, lower extremity edema with stasis ulcer. She is febrile, tachycardic, diaphoretic, and weak. NO nausea, vomiting, diarrhea, headache. She reports that she has been sleeping a lot.    No past medical history on file.  No past surgical history on file.  No family history on file.  History  Substance Use Topics  . Smoking status: Not on file  . Smokeless tobacco: Not on file  . Alcohol Use: Not on file    OB History   No data available      Review of Systems  Constitutional: Positive for fever and fatigue.  Respiratory: Positive for cough and shortness of breath.   Genitourinary: Positive for dysuria.    Allergies  Ace inhibitors  Home Medications  No current outpatient prescriptions on file.  There were no vitals taken for this visit.  Physical Exam  Nursing note and vitals reviewed. Constitutional: She appears well-developed and well-nourished. She appears ill. No distress.  HENT:  Head: Normocephalic and atraumatic.  Eyes: Pupils are equal, round, and reactive to light.  Neck: Normal range of motion. Neck supple.  Cardiovascular: Normal rate and regular rhythm.   Pulmonary/Chest: Effort normal. No respiratory distress. She has no wheezes. She has rales.  Abdominal: Soft. She exhibits no distension. There is no tenderness.  Neurological: She is alert.  Skin: Skin is warm. She is diaphoretic.    ED Course  Procedures (including critical care time)  Labs Reviewed  CBC WITH DIFFERENTIAL  PRO B NATRIURETIC  PEPTIDE   No results found.   No diagnosis found.    MDM   Date: 08/05/2012  Rate: 120  Rhythm: sinus tachycardia  QRS Axis: normal  Intervals: normal  ST/T Wave abnormalities: normal  Conduction Disutrbances:incomplete RBBB and LAFB  Narrative Interpretation:  Probably left atrial abnormality  Old EKG Reviewed: none available   Concern for early sepsis. Blood cultures, lactate, bnp, bmp, urinalysis, ekg and chest xray. Will give patient fluids and Tylenol for fever.  End of shift care handed off to oncoming PA-C. Currently waiting for lab results then most likely will admit.       Dorthula Matas, PA-C 08/05/12 1503

## 2012-08-05 NOTE — Progress Notes (Signed)
ANTIBIOTIC CONSULT NOTE - INITIAL  Pharmacy Consult for zosyn Indication: UTI  Allergies  Allergen Reactions  . Ace Inhibitors     REACTION: angioedema    Patient Measurements:    Vital Signs: Temp: 99 F (37.2 C) (06/11 1947) Temp src: Oral (06/11 1947) BP: 152/86 mmHg (06/11 1947) Pulse Rate: 97 (06/11 1947) Intake/Output from previous day:   Intake/Output from this shift:    Labs:  Recent Labs  08/05/12 1357 08/05/12 1458  WBC 16.2*  --   HGB 16.8*  --   PLT 178  --   CREATININE  --  0.93   The CrCl is unknown because both a height and weight (above a minimum accepted value) are required for this calculation. No results found for this basename: VANCOTROUGH, VANCOPEAK, VANCORANDOM, GENTTROUGH, GENTPEAK, GENTRANDOM, TOBRATROUGH, TOBRAPEAK, TOBRARND, AMIKACINPEAK, AMIKACINTROU, AMIKACIN,  in the last 72 hours   Microbiology: No results found for this or any previous visit (from the past 720 hour(s)).  Medical History: Past Medical History  Diagnosis Date  . Hypertension   . Diabetes mellitus without complication     Medications:  Anti-infectives   Start     Dose/Rate Route Frequency Ordered Stop   08/06/12 0400  piperacillin-tazobactam (ZOSYN) IVPB 3.375 g     3.375 g 12.5 mL/hr over 240 Minutes Intravenous Every 8 hours 08/05/12 2139     08/05/12 2145  piperacillin-tazobactam (ZOSYN) IVPB 3.375 g     3.375 g 100 mL/hr over 30 Minutes Intravenous  Once 08/05/12 2138     08/05/12 1615  cefTRIAXone (ROCEPHIN) 1 g in dextrose 5 % 50 mL IVPB     1 g 100 mL/hr over 30 Minutes Intravenous  Once 08/05/12 1603 08/05/12 1844     Assessment: 56 yof presented to the ED with SOB. To start empiric zosyn for possible UTI. She has already received 1x dose of ceftriaxone. Tmax 102.9, WBC 16.2, good renal fxn.   CTX x 1 6/11 Zosyn 6/11>  Goal of Therapy:  Eradication of infection  Plan:  1. Zosyn 3.375gm IV x 1 over 30 minutes then zosyn 3.375gm IV Q8H (4 hr  inf) 2. F/u renal fxn, C&S, clinical status  Jaleena Viviani, Drake Leach 08/05/2012,9:39 PM

## 2012-08-06 ENCOUNTER — Inpatient Hospital Stay (HOSPITAL_COMMUNITY): Payer: Medicaid Other

## 2012-08-06 ENCOUNTER — Encounter (HOSPITAL_COMMUNITY): Payer: Self-pay | Admitting: General Practice

## 2012-08-06 LAB — CBC WITH DIFFERENTIAL/PLATELET
Basophils Relative: 0 % (ref 0–1)
HCT: 44.8 % (ref 36.0–46.0)
Hemoglobin: 15.3 g/dL — ABNORMAL HIGH (ref 12.0–15.0)
Lymphocytes Relative: 19 % (ref 12–46)
MCHC: 34.2 g/dL (ref 30.0–36.0)
Monocytes Absolute: 0.2 10*3/uL (ref 0.1–1.0)
Monocytes Relative: 2 % — ABNORMAL LOW (ref 3–12)
Neutro Abs: 6.6 10*3/uL (ref 1.7–7.7)

## 2012-08-06 LAB — COMPREHENSIVE METABOLIC PANEL
ALT: 20 U/L (ref 0–35)
Calcium: 8.6 mg/dL (ref 8.4–10.5)
GFR calc Af Amer: 69 mL/min — ABNORMAL LOW (ref >90)
Glucose, Bld: 140 mg/dL — ABNORMAL HIGH (ref 70–99)
Sodium: 137 mEq/L (ref 135–145)
Total Protein: 6.8 g/dL (ref 6.0–8.3)

## 2012-08-06 LAB — GLUCOSE, CAPILLARY
Glucose-Capillary: 154 mg/dL — ABNORMAL HIGH (ref 70–99)
Glucose-Capillary: 151 mg/dL — ABNORMAL HIGH (ref 70–99)
Glucose-Capillary: 227 mg/dL — ABNORMAL HIGH (ref 70–99)
Glucose-Capillary: 136 mg/dL — ABNORMAL HIGH (ref 70–99)

## 2012-08-06 LAB — HEMOGLOBIN A1C: Mean Plasma Glucose: 194 mg/dL — ABNORMAL HIGH (ref <117)

## 2012-08-06 MED ORDER — INSULIN ASPART 100 UNIT/ML ~~LOC~~ SOLN: 0.0000 [IU] | Freq: Every day | SUBCUTANEOUS | Status: DC

## 2012-08-06 MED ORDER — INSULIN ASPART 100 UNIT/ML ~~LOC~~ SOLN
0.0000 [IU] | Freq: Three times a day (TID) | SUBCUTANEOUS | Status: DC
  Administered 2012-08-06: 2 [IU] via SUBCUTANEOUS
  Administered 2012-08-06: 3 [IU] via SUBCUTANEOUS
  Administered 2012-08-07: 2 [IU] via SUBCUTANEOUS
  Administered 2012-08-07 – 2012-08-08 (×4): 1 [IU] via SUBCUTANEOUS
  Administered 2012-08-08 – 2012-08-09 (×2): 2 [IU] via SUBCUTANEOUS
  Administered 2012-08-09: 3 [IU] via SUBCUTANEOUS
  Administered 2012-08-09: 1 [IU] via SUBCUTANEOUS
  Administered 2012-08-10: 5 [IU] via SUBCUTANEOUS
  Administered 2012-08-10: 2 [IU] via SUBCUTANEOUS

## 2012-08-06 MED ORDER — ENOXAPARIN SODIUM 80 MG/0.8ML ~~LOC~~ SOLN
65.0000 mg | SUBCUTANEOUS | Status: DC
  Administered 2012-08-06 – 2012-08-09 (×4): 65 mg via SUBCUTANEOUS
  Filled 2012-08-06 (×5): qty 0.8

## 2012-08-06 MED ORDER — PRO-STAT SUGAR FREE PO LIQD
30.0000 mL | Freq: Two times a day (BID) | ORAL | Status: DC
  Administered 2012-08-06 – 2012-08-10 (×8): 30 mL via ORAL
  Filled 2012-08-06 (×10): qty 30

## 2012-08-06 MED ORDER — NICOTINE 14 MG/24HR TD PT24
14.0000 mg | MEDICATED_PATCH | Freq: Every day | TRANSDERMAL | Status: DC
  Administered 2012-08-06 – 2012-08-10 (×5): 14 mg via TRANSDERMAL
  Filled 2012-08-06 (×5): qty 1

## 2012-08-06 NOTE — Care Management Note (Addendum)
    Page 1 of 2   08/10/2012     3:13:11 PM   CARE MANAGEMENT NOTE 08/10/2012  Patient:  [name], [name]   Account Number:  [id number]  Date Initiated:  08/06/2012  Documentation initiated by:  GRAVES-BIGELOW,Makylah Bossard  Subjective/Objective Assessment:   Pt admitted for SOB. Pt is from home with husband and young child. Pt has medicaid and states she has not been to the Dr in 2 years to get Rx for her diabetes medications. Pt states she [facility] to [facility] to [facility].     Action/Plan:   CM provided pt with the # to Family Medicine at [facility] and the [facility]. Pt needs to make own appointment and f/u with DSS to make them aware of MD change. Pt to call for tx set up with medicaid also.   Anticipated DC Date:  08/08/2012   Anticipated DC Plan:  HOME/SELF CARE      DC Planning Services  CM consult      [facility] Choice  HOME HEALTH   Choice offered to / List presented to:  C-1 Patient        HH arranged  HH-1 RN  HH-10 DISEASE MANAGEMENT      HH agency  [facility]   Status of service:  Completed, signed off Medicare Important Message given?   (If response is "NO", the following Medicare IM given date fields will be blank) Date Medicare IM given:   Date Additional Medicare IM given:    Discharge Disposition:  HOME W HOME HEALTH SERVICES  Per UR Regulation:  Reviewed for med. necessity/level of care/duration of stay  If discussed at Long Length of Stay Meetings, dates discussed:    Comments:  08-10-12 1511 Tomi Bamberger, RN,BSN [phone] Pt was d/c before CM was able to speak to . CM did make a referral for [facility] services with [facility]. SOC to begin within 24-48 hours post d/c. No further needs from CM at this time.   08-06-12 [address], [place] [phone] CM did call [facility] to see if pt will be a candidate for services. NO further needs from CM at this time.

## 2012-08-06 NOTE — Progress Notes (Signed)
[facility] PROGRESS NOTE  [name] YNW:[id number] DOB: 14-Jul-1955 DOA: 08/05/2012 PCP: Tereso Newcomer, PA-C Brief HPI:  [name] is a 57 y.o. female no history of diabetes mellitus type 2, hypertension, ongoing tobacco abuse, COPD started experiencing shortness of breath and fever chills over the last 2 days. Due to the symptoms being persistent patient presented to the ER. In the ER CT angiogram of the chest was negative for PE and UA was compatible with UTI. Patient has been in pretty start antibiotics and admitted for further management.   Assessment/Plan:  1. SIRS possible source could be UTI - continue on Zosyn and follow blood cultures and urine cultures. Check renal sonogram for the obstruction. Continue hydration.   2. Diabetes mellitus type 2 -patient has not been taking her medications for 2 years due to financial issues. Check hemoglobin A1c and sliding scale coverage for now.  CBG (last 3)   Recent Labs  08/05/12 1404 08/05/12 2201 08/06/12 0821  GLUCAP 352* 170* 154*     3. Hypertension - patient has been placed on when necessary IV hydralazine for now. Closely follow blood pressure trends.  4.  COPD - not wheezing presently. Resume nebs as needed.   5. Tobacco abuse - strongly advised to quit smoking. Nicotine patch ordered  6. Chronic pain - continue home medications.   7. Stasis ulcer on the leg- wound care consulted.   8. DVT PROPHYLAXIS.    Code Status: full code.  Family Communication: noen at bedside.  Disposition Plan: pending. Possibly home when stable   Consultants:  None.  Antibiotics:  Rocephin 6/11  HPI/Subjective: Comfortable, wants to know when she can go home. Denies any nausea or vomiting after admit.   Objective: Filed Vitals:   08/05/12 2226 08/05/12 2343 08/06/12 0100 08/06/12 0455  BP: 141/70 153/81  167/94  Pulse: 100 104  93  Temp:  100.4 F (38 C) 98.9 F (37.2 C) 98.6 F (37 C)  TempSrc:  Oral  Oral   Resp: 24 22  18   Weight:    133.539 kg (294 lb 6.4 oz)  SpO2: 94% 92%  93%    Intake/Output Summary (Last 24 hours) at 08/06/12 1016 Last data filed at 08/06/12 0835  Gross per 24 hour  Intake    360 ml  Output      1 ml  Net    359 ml   Filed Weights   08/06/12 0455  Weight: 133.539 kg (294 lb 6.4 oz)    Exam:  Cardiovascular: S1-S2 heard tachycardic.  Respiratory: No rhonchi or crepitations  Abdomen: Soft nontender bowel sounds present. Musculoskeletal: stasis ulcer on the calf Neurologic: Alert awake oriented to time place and person. Moves   Data Reviewed: Basic Metabolic Panel:  Recent Labs Lab 08/05/12 1458 08/06/12 0512  NA 136 137  K 3.7 3.7  CL 97 99  CO2 27 32  GLUCOSE 303* 140*  BUN 12 13  CREATININE 0.93 1.04  CALCIUM 8.8 8.6   Liver Function Tests:  Recent Labs Lab 08/05/12 1458 08/06/12 0512  AST 32 19  ALT 26 20  ALKPHOS 113 94  BILITOT 0.5 0.5  PROT 7.6 6.8  ALBUMIN 4.0 3.3*   No results found for this basename: LIPASE, AMYLASE,  in the last 168 hours No results found for this basename: AMMONIA,  in the last 168 hours CBC:  Recent Labs Lab 08/05/12 1357 08/06/12 0512  WBC 16.2* 8.5  NEUTROABS 14.8* 6.6  HGB 16.8*  15.3*  HCT 46.9* 44.8  MCV 84.5 86.8  PLT 178 162   Cardiac Enzymes:  Recent Labs Lab 08/05/12 2050  CKTOTAL 94  TROPONINI <0.30   BNP (last 3 results)  Recent Labs  08/05/12 1357  PROBNP 253.6*   CBG:  Recent Labs Lab 08/05/12 1404 08/05/12 2201 08/06/12 0821  GLUCAP 352* 170* 154*    Recent Results (from the past 240 hour(s))  CULTURE, BLOOD (ROUTINE X 2)     Status: None   Collection Time    08/05/12  3:00 PM      Result Value Range Status   Specimen Description BLOOD BLOOD RIGHT FOREARM   Final   Special Requests BOTTLES DRAWN AEROBIC ONLY 10CC   Final   Culture  Setup Time 08/05/2012 22:01   Final   Culture     Final   Value:        BLOOD CULTURE RECEIVED NO GROWTH TO DATE CULTURE  WILL BE HELD FOR 5 DAYS BEFORE ISSUING A FINAL NEGATIVE REPORT   Report Status PENDING   Incomplete  CULTURE, BLOOD (ROUTINE X 2)     Status: None   Collection Time    08/05/12  3:09 PM      Result Value Range Status   Specimen Description BLOOD BLOOD LEFT FOREARM   Final   Special Requests BOTTLES DRAWN AEROBIC ONLY 4CC   Final   Culture  Setup Time 08/05/2012 22:01   Final   Culture     Final   Value:        BLOOD CULTURE RECEIVED NO GROWTH TO DATE CULTURE WILL BE HELD FOR 5 DAYS BEFORE ISSUING A FINAL NEGATIVE REPORT   Report Status PENDING   Incomplete     Studies: Dg Chest 2 View  08/05/2012   *RADIOLOGY REPORT*  Clinical Data: Shortness of breath.  Chest pain and leg swelling  CHEST - 2 VIEW  Comparison: 08/27/2004  Findings: The heart size and mediastinal contours are within normal limits.  Both lungs are clear.  The visualized skeletal structures are remarkable for mild spondylosis.  IMPRESSION: Negative exam.   Original Report Authenticated By: Signa Kell, M.D.   Ct Angio Chest W/cm &/or Wo Cm  08/05/2012   *RADIOLOGY REPORT*  Clinical Data: Shortness of breath.  Cough and fever.  CT ANGIOGRAPHY CHEST  Technique:  Multidetector CT imaging of the chest using the standard protocol during bolus administration of intravenous contrast. Multiplanar reconstructed images including MIPs were obtained and reviewed to evaluate the vascular anatomy.  Contrast: 80mL OMNIPAQUE IOHEXOL 350 MG/ML SOLN  Comparison: Two-view chest 08/05/2012.  CTA chest 08/08/2004.  Findings: Pulmonary arterial opacification is suboptimal.  No significant proximal emboli are present within the lobar arteries. Subsegmental vessels are incompletely evaluated.  Heart size is normal.  No significant mediastinal or axillary adenopathy is present.  There is no significant pleural or pericardial effusion.  Diffuse fatty infiltration of the liver is again noted.  Limited imaging of the abdomen is otherwise unremarkable.  A 5 mm  nodule in the right middle lobe is not significantly changed.  Minimal atelectasis is present at both lung bases.  No other focal nodule, mass, or airspace disease is evident.  The bone windows demonstrate mild degenerative changes in the thoracic spine with slight rightward curvature.  IMPRESSION:  1.  No evidence of pulmonary embolus. 2.  Evaluation of distal subsegmental vessels is somewhat limited by a suboptimal contrast bolus. 3.  Stable 5 mm right middle lobe  nodule. 4.  No acute or focal abnormality to explain the patient's symptoms otherwise. 5.  Mild degenerative changes of the thoracic spine.   Original Report Authenticated By: Marin Roberts, M.D.   US Renal  08/06/2012   *RADIOLOGY REPORT*  Clinical Data: 57 year old female with dysuria.  RENAL/URINARY TRACT ULTRASOUND COMPLETE  Comparison:  None  Findings:  Right Kidney:  The right kidney is normal in echogenicity and size measuring 13.2 cm.  There is no evidence of hydronephrosis, solid renal mass or definite renal calculi.  Left Kidney:  The left kidney is normal in echogenicity and size measuring 13.6 cm.  There is no evidence of hydronephrosis, solid renal mass or definite renal calculi.  Bladder:  The bladder is not well distended but appears normal for degree of filling.  IMPRESSION: Unremarkable renal ultrasound.   Original Report Authenticated By: Harmon Pier, M.D.    Scheduled Meds: . insulin aspart  0-9 Units Subcutaneous TID WC  . methadone  40 mg Oral TID  . piperacillin-tazobactam (ZOSYN)  IV  3.375 g Intravenous Q8H  . pneumococcal 23 valent vaccine  0.5 mL Intramuscular Tomorrow-1000  . pregabalin  50 mg Oral TID  . sodium chloride  3 mL Intravenous Q12H   Continuous Infusions: . sodium chloride 125 mL/hr at 08/06/12 1610    Principal Problem:   SIRS (systemic inflammatory response syndrome) Active Problems:   DIABETES MELLITUS, TYPE II   Chronic pain syndrome   HYPERTENSION   COPD   UTI (lower urinary tract  infection)   Diabetes mellitus   Tobacco abuse    Time spent: 25  minutes    Jakori Burkett  [facility] Pager [phone] If 7PM-7AM, please contact night-coverage at [email or web address], password [email or web address] 08/06/2012, 10:16 AM  LOS: 1 day

## 2012-08-06 NOTE — Progress Notes (Signed)
ANTICOAGULATION CONSULT NOTE - Initial Consult  Pharmacy Consult for Lovenox Indication: VTE prophylaxis  Allergies  Allergen Reactions  . Ace Inhibitors     REACTION: angioedema    Patient Measurements: Weight: 294 lb 6.4 oz (133.539 kg) Heparin Dosing Weight: 133.5 kg  Vital Signs: Temp: 98.6 F (37 C) (06/12 0455) Temp src: Oral (06/12 0455) BP: 167/94 mmHg (06/12 0455) Pulse Rate: 93 (06/12 0455)  Labs:  Recent Labs  08/05/12 1357 08/05/12 1458 08/05/12 2050 08/06/12 0512  HGB 16.8*  --   --  15.3*  HCT 46.9*  --   --  44.8  PLT 178  --   --  162  CREATININE  --  0.93  --  1.04  CKTOTAL  --   --  94  --   TROPONINI  --   --  <0.30  --     The CrCl is unknown because both a height and weight (above a minimum accepted value) are required for this calculation.   Medical History: Past Medical History  Diagnosis Date  . Hypertension   . Diabetes mellitus without complication   . COPD (chronic obstructive pulmonary disease)   . Shortness of breath   . Depression   . Arthritis     Medications:  Prescriptions prior to admission  Medication Sig Dispense Refill  . ibuprofen (ADVIL,MOTRIN) 200 MG tablet Take 400 mg by mouth every 6 (six) hours as needed for pain.      . methadone (DOLOPHINE) 10 MG tablet Take 40 mg by mouth 3 (three) times daily.      . pregabalin (LYRICA) 50 MG capsule Take 50 mg by mouth 3 (three) times daily.        Assessment: SOB, fever, chills  PMH: HTN, DM  AC:VTE prophylaxis with BMI>30. CBC WNL on admit. Estimated CrCl 86.  ID: Tmax 102.9. WBC 16.2 down to 8.5 .Zosyn D#1 for UTI.  Zosyn 6/11>>  CV:HTN,167/94, HR 93   Endo: DM on SSI  GI/Nutr:  Neuro: Chronic pain on Nicotine patch, and Lyrica, methadone  Renal: Scr 1.04  Pulm: ongoing tobacco, COPD   Goal of Therapy:  VTE prophx dose Lovenox Monitor platelets by anticoagulation protocol: Yes   Plan:  Zosyn 3.375g IV q8hrs Start Lovenox 65mg /24h  Misty Stanley Stillinger 08/06/2012,12:39 PM

## 2012-08-06 NOTE — ED Provider Notes (Signed)
Medical screening examination/treatment/procedure(s) were performed by non-physician practitioner and as supervising physician I was immediately available for consultation/collaboration.   Christopher J. Pollina, MD 08/06/12 0707 

## 2012-08-06 NOTE — Plan of Care (Signed)
Problem: Phase II Progression Outcomes Goal: Progress activity as tolerated unless otherwise ordered Outcome: Completed/Met Date Met:  08/06/12 Pt up to sink to sponge bath,up in chair at bedside.ambulates in room without difficulty

## 2012-08-06 NOTE — Progress Notes (Signed)
INITIAL NUTRITION ASSESSMENT  DOCUMENTATION CODES Per approved criteria  -Morbid Obesity   INTERVENTION:  Prostat liquid protein twice daily with meals (100 kcals, 15 gm protein per dose) RD to follow for nutrition care plan  NUTRITION DIAGNOSIS: Increased nutrient needs related to wound healing as evidenced by estimated nutrition needs  Goal: Oral intake with meals & supplements to meet >/= 90% of estimated nutrition needs  Monitor:  PO & supplemental intake, weight, labs, I/O's  Reason for Assessment: Malnutrition Screening Tool Report  57 y.o. female  Admitting Dx: SIRS (systemic inflammatory response syndrome)  ASSESSMENT: Patient with history of DM type 2, HTN, ongoing tobacco abuse, COPD; started experiencing SOB and fever chills; CT angiogram of chest negative for PE and UA compatible with UTI ---> started on ABX and admitted for further management.   Patient reports her appetite is good; PO intake 100% per flowsheet records; [facility] note reviewed ---> patient with new partial thickness wound to calf; would benefit from additional protein for healing ---> RD to order supplement.   Height: Ht Readings from Last 1 Encounters:  08/06/12 5' 4.96" (1.65 m)    Weight: Wt Readings from Last 1 Encounters:  08/06/12 294 lb 6.4 oz (133.539 kg)    Ideal Body Weight: 125 lb  % Ideal Body Weight: 235%  Wt Readings from Last 10 Encounters:  08/06/12 294 lb 6.4 oz (133.539 kg)  09/01/09 308 lb (139.708 kg)  08/11/09 310 lb (140.615 kg)  06/07/08 326 lb (147.873 kg)  04/15/07 321 lb (145.605 kg)    Usual Body Weight: 308 lb  % Usual Body Weight: 95%  BMI:  Body mass index is 49.05 kg/(m^2).  Estimated Nutritional Needs: Kcal: 1900-2100 Protein: 105-115 gm Fluid: 1.0-2.0 L  Skin: partial thickness calf wound  Diet Order: Carb Control  EDUCATION NEEDS: -No education needs identified at this time   Intake/Output Summary (Last 24 hours) at 08/06/12 1528 Last  data filed at 08/06/12 1314  Gross per 24 hour  Intake    600 ml  Output      3 ml  Net    597 ml    Last BM: 6/10  Labs:   Recent Labs Lab 08/05/12 1458 08/06/12 0512  NA 136 137  K 3.7 3.7  CL 97 99  CO2 27 32  BUN 12 13  CREATININE 0.93 1.04  CALCIUM 8.8 8.6  GLUCOSE 303* 140*    CBG (last 3)   Recent Labs  08/05/12 2201 08/06/12 0821 08/06/12 1115  GLUCAP 170* 154* 151*    Scheduled Meds: . enoxaparin (LOVENOX) injection  65 mg Subcutaneous Q24H  . insulin aspart  0-5 Units Subcutaneous QHS  . insulin aspart  0-9 Units Subcutaneous TID WC  . methadone  40 mg Oral TID  . nicotine  14 mg Transdermal Daily  . piperacillin-tazobactam (ZOSYN)  IV  3.375 g Intravenous Q8H  . pregabalin  50 mg Oral TID  . sodium chloride  3 mL Intravenous Q12H    Continuous Infusions: . sodium chloride 125 mL/hr at 08/06/12 0052    Past Medical History  Diagnosis Date  . Hypertension   . Diabetes mellitus without complication   . COPD (chronic obstructive pulmonary disease)   . Shortness of breath   . Depression   . Arthritis     Past Surgical History  Procedure Laterality Date  . Cholecystectomy    . Tubal ligation    . Carpel tunnel    . Breast surgery  LUMPECTOMY     Maureen Chatters, RD, LDN Pager #: [phone] After-Hours Pager #: [phone]

## 2012-08-06 NOTE — Consult Note (Signed)
WOC consult Note Reason for Consult: Consult requested for left stasis ulcer.  Pt states she has had other wounds in the past which have healed and she is well-informed on plan of care. This wound developed in the past few weeks. Wound type: Partial thickness to posterior calf. Measurement: 1X1X.1cm Wound bed: 100% red and moist Drainage (amount, consistency, odor) small yellow drainage, no odor Periwound: dark ruddy skin surrounding, consistent with venous stasis changes to skin. Dressing procedure/placement/frequency: Foam dressing to absorb drainage and promote healing. Please re-consult if further assistance is needed.  Thank-you,  Cammie Mcgee MSN, RN, CWOCN, [place], CNS [phone]

## 2012-08-06 NOTE — Consult Note (Signed)
WOC follow-up: Bedside nurse called and stated that drainage has saturated foam dressing to left leg wound.  Orders changed to ABD pads BID or PRN when saturated with kerlex to hold in place.  Foam dressing can be used when drainage decreases. Please re-consult if further assistance is needed.  Thank-you,  Cammie Mcgee MSN, RN, CWOCN, [place], CNS [phone]

## 2012-08-07 DIAGNOSIS — G894 Chronic pain syndrome: Secondary | ICD-10-CM

## 2012-08-07 DIAGNOSIS — I10 Essential (primary) hypertension: Secondary | ICD-10-CM

## 2012-08-07 LAB — GLUCOSE, CAPILLARY
Glucose-Capillary: 134 mg/dL — ABNORMAL HIGH (ref 70–99)
Glucose-Capillary: 157 mg/dL — ABNORMAL HIGH (ref 70–99)

## 2012-08-07 MED ORDER — METFORMIN HCL 500 MG PO TABS
500.0000 mg | ORAL_TABLET | Freq: Two times a day (BID) | ORAL | Status: DC
  Administered 2012-08-08: 500 mg via ORAL
  Filled 2012-08-07 (×3): qty 1

## 2012-08-07 MED ORDER — FUROSEMIDE 40 MG PO TABS
40.0000 mg | ORAL_TABLET | Freq: Every day | ORAL | Status: DC
  Administered 2012-08-07 – 2012-08-10 (×4): 40 mg via ORAL
  Filled 2012-08-07 (×4): qty 1

## 2012-08-07 NOTE — Progress Notes (Signed)
[facility] PROGRESS NOTE  [name] ZOX:[id number] DOB: 1955-09-10 DOA: 08/05/2012 PCP: No primary provider on file. Brief HPI:  [name] is a 57 y.o. female no history of diabetes mellitus type 2, hypertension, ongoing tobacco abuse, COPD started experiencing shortness of breath and fever chills over the last 2 days. Due to the symptoms being persistent patient presented to the ER. In the ER CT angiogram of the chest was negative for PE and UA was compatible with UTI. Patient has been in pretty start antibiotics and admitted for further management.   Assessment/Plan:  1. SIRS possible source could be UTI - continue on Zosyn and follow blood cultures and urine cultures.  renal sonogram does not show obstruction.    2. Diabetes mellitus type 2 -patient has not been taking her medications for 2 years due to financial issues. hemoglobin A1c is 8.4 and sliding scale coverage for now.  CBG (last 3)   Recent Labs  08/06/12 2225 08/07/12 0720 08/07/12 1134  GLUCAP 136* 134* 153*     3. Hypertension - patient has been placed on when necessary IV hydralazine for now. Closely follow blood pressure trends.  4.  COPD - not wheezing presently. Resume nebs as needed.   5. Tobacco abuse - strongly advised to quit smoking. Nicotine patch ordered  6. Chronic pain - continue home medications.   7. Stasis ulcer on the leg- wound care consulted. Resume lasix.   8. DVT PROPHYLAXIS.    Code Status: full code.  Family Communication: noen at bedside.  Disposition Plan: pending. Possibly home when stable   Consultants:  None.  Antibiotics:  Rocephin 6/11  HPI/Subjective: Comfortable, wants to know when she can go home. Denies any nausea or vomiting after admit.   Objective: Filed Vitals:   08/06/12 1940 08/06/12 2038 08/07/12 0455 08/07/12 1300  BP: 150/52 159/78 129/63 150/85  Pulse: 97 92 88 82  Temp: 99.8 F (37.7 C) 97.7 F (36.5 C) 98.5 F (36.9 C) 98.2 F (36.8  C)  TempSrc: Oral Axillary Oral Oral  Resp:    18  Height:      Weight:   135.671 kg (299 lb 1.6 oz)   SpO2: 97% 93% 97% 95%    Intake/Output Summary (Last 24 hours) at 08/07/12 1443 Last data filed at 08/06/12 1800  Gross per 24 hour  Intake    240 ml  Output      1 ml  Net    239 ml   Filed Weights   08/06/12 0455 08/07/12 0455  Weight: 133.539 kg (294 lb 6.4 oz) 135.671 kg (299 lb 1.6 oz)    Exam:  Cardiovascular: S1-S2 heard tachycardic.  Respiratory: No rhonchi or crepitations  Abdomen: Soft nontender bowel sounds present. Musculoskeletal: stasis ulcer on the calf Neurologic: Alert awake oriented to time place and person. Moves   Data Reviewed: Basic Metabolic Panel:  Recent Labs Lab 08/05/12 1458 08/06/12 0512  NA 136 137  K 3.7 3.7  CL 97 99  CO2 27 32  GLUCOSE 303* 140*  BUN 12 13  CREATININE 0.93 1.04  CALCIUM 8.8 8.6   Liver Function Tests:  Recent Labs Lab 08/05/12 1458 08/06/12 0512  AST 32 19  ALT 26 20  ALKPHOS 113 94  BILITOT 0.5 0.5  PROT 7.6 6.8  ALBUMIN 4.0 3.3*   No results found for this basename: LIPASE, AMYLASE,  in the last 168 hours No results found for this basename: AMMONIA,  in the last  168 hours CBC:  Recent Labs Lab 08/05/12 1357 08/06/12 0512  WBC 16.2* 8.5  NEUTROABS 14.8* 6.6  HGB 16.8* 15.3*  HCT 46.9* 44.8  MCV 84.5 86.8  PLT 178 162   Cardiac Enzymes:  Recent Labs Lab 08/05/12 2050  CKTOTAL 94  TROPONINI <0.30   BNP (last 3 results)  Recent Labs  08/05/12 1357  PROBNP 253.6*   CBG:  Recent Labs Lab 08/06/12 1115 08/06/12 1644 08/06/12 2225 08/07/12 0720 08/07/12 1134  GLUCAP 151* 227* 136* 134* 153*    Recent Results (from the past 240 hour(s))  URINE CULTURE     Status: None   Collection Time    08/05/12  2:42 PM      Result Value Range Status   Specimen Description URINE, CLEAN CATCH   Final   Special Requests NONE   Final   Culture  Setup Time 08/05/2012 22:25   Final    Colony Count >=100,000 COLONIES/ML   Final   Culture GRAM NEGATIVE RODS   Final   Report Status PENDING   Incomplete  CULTURE, BLOOD (ROUTINE X 2)     Status: None   Collection Time    08/05/12  3:00 PM      Result Value Range Status   Specimen Description BLOOD BLOOD RIGHT FOREARM   Final   Special Requests BOTTLES DRAWN AEROBIC ONLY 10CC   Final   Culture  Setup Time 08/05/2012 22:01   Final   Culture     Final   Value:        BLOOD CULTURE RECEIVED NO GROWTH TO DATE CULTURE WILL BE HELD FOR 5 DAYS BEFORE ISSUING A FINAL NEGATIVE REPORT   Report Status PENDING   Incomplete  CULTURE, BLOOD (ROUTINE X 2)     Status: None   Collection Time    08/05/12  3:09 PM      Result Value Range Status   Specimen Description BLOOD BLOOD LEFT FOREARM   Final   Special Requests BOTTLES DRAWN AEROBIC ONLY 4CC   Final   Culture  Setup Time 08/05/2012 22:01   Final   Culture     Final   Value:        BLOOD CULTURE RECEIVED NO GROWTH TO DATE CULTURE WILL BE HELD FOR 5 DAYS BEFORE ISSUING A FINAL NEGATIVE REPORT   Report Status PENDING   Incomplete     Studies: Dg Chest 2 View  08/05/2012   *RADIOLOGY REPORT*  Clinical Data: Shortness of breath.  Chest pain and leg swelling  CHEST - 2 VIEW  Comparison: 08/27/2004  Findings: The heart size and mediastinal contours are within normal limits.  Both lungs are clear.  The visualized skeletal structures are remarkable for mild spondylosis.  IMPRESSION: Negative exam.   Original Report Authenticated By: Signa Kell, M.D.   Ct Angio Chest W/cm &/or Wo Cm  08/05/2012   *RADIOLOGY REPORT*  Clinical Data: Shortness of breath.  Cough and fever.  CT ANGIOGRAPHY CHEST  Technique:  Multidetector CT imaging of the chest using the standard protocol during bolus administration of intravenous contrast. Multiplanar reconstructed images including MIPs were obtained and reviewed to evaluate the vascular anatomy.  Contrast: 80mL OMNIPAQUE IOHEXOL 350 MG/ML SOLN  Comparison:  Two-view chest 08/05/2012.  CTA chest 08/08/2004.  Findings: Pulmonary arterial opacification is suboptimal.  No significant proximal emboli are present within the lobar arteries. Subsegmental vessels are incompletely evaluated.  Heart size is normal.  No significant mediastinal or axillary adenopathy is  present.  There is no significant pleural or pericardial effusion.  Diffuse fatty infiltration of the liver is again noted.  Limited imaging of the abdomen is otherwise unremarkable.  A 5 mm nodule in the right middle lobe is not significantly changed.  Minimal atelectasis is present at both lung bases.  No other focal nodule, mass, or airspace disease is evident.  The bone windows demonstrate mild degenerative changes in the thoracic spine with slight rightward curvature.  IMPRESSION:  1.  No evidence of pulmonary embolus. 2.  Evaluation of distal subsegmental vessels is somewhat limited by a suboptimal contrast bolus. 3.  Stable 5 mm right middle lobe nodule. 4.  No acute or focal abnormality to explain the patient's symptoms otherwise. 5.  Mild degenerative changes of the thoracic spine.   Original Report Authenticated By: Marin Roberts, M.D.   US Renal  08/06/2012   *RADIOLOGY REPORT*  Clinical Data: 57 year old female with dysuria.  RENAL/URINARY TRACT ULTRASOUND COMPLETE  Comparison:  None  Findings:  Right Kidney:  The right kidney is normal in echogenicity and size measuring 13.2 cm.  There is no evidence of hydronephrosis, solid renal mass or definite renal calculi.  Left Kidney:  The left kidney is normal in echogenicity and size measuring 13.6 cm.  There is no evidence of hydronephrosis, solid renal mass or definite renal calculi.  Bladder:  The bladder is not well distended but appears normal for degree of filling.  IMPRESSION: Unremarkable renal ultrasound.   Original Report Authenticated By: Harmon Pier, M.D.    Scheduled Meds: . enoxaparin (LOVENOX) injection  65 mg Subcutaneous Q24H  .  feeding supplement  30 mL Oral BID WC  . insulin aspart  0-5 Units Subcutaneous QHS  . insulin aspart  0-9 Units Subcutaneous TID WC  . [START ON 08/08/2012] metFORMIN  500 mg Oral BID WC  . methadone  40 mg Oral TID  . nicotine  14 mg Transdermal Daily  . piperacillin-tazobactam (ZOSYN)  IV  3.375 g Intravenous Q8H  . pregabalin  50 mg Oral TID  . sodium chloride  3 mL Intravenous Q12H   Continuous Infusions:    Principal Problem:   SIRS (systemic inflammatory response syndrome) Active Problems:   DIABETES MELLITUS, TYPE II   Chronic pain syndrome   HYPERTENSION   COPD   UTI (lower urinary tract infection)   Diabetes mellitus   Tobacco abuse    Time spent: 25  minutes    Mithran Strike  [facility] Pager [phone] If 7PM-7AM, please contact night-coverage at [email or web address], password [facility] 08/07/2012, 2:43 PM  LOS: 2 days

## 2012-08-08 ENCOUNTER — Inpatient Hospital Stay (HOSPITAL_COMMUNITY): Payer: Medicaid Other

## 2012-08-08 DIAGNOSIS — R0602 Shortness of breath: Secondary | ICD-10-CM

## 2012-08-08 DIAGNOSIS — M7989 Other specified soft tissue disorders: Secondary | ICD-10-CM

## 2012-08-08 DIAGNOSIS — J449 Chronic obstructive pulmonary disease, unspecified: Secondary | ICD-10-CM

## 2012-08-08 LAB — URINE CULTURE

## 2012-08-08 LAB — CBC
HCT: 44.9 % (ref 36.0–46.0)
MCV: 87 fL (ref 78.0–100.0)
Platelets: 201 10*3/uL (ref 150–400)
RBC: 5.16 MIL/uL — ABNORMAL HIGH (ref 3.87–5.11)
RDW: 13.1 % (ref 11.5–15.5)
WBC: 6.4 10*3/uL (ref 4.0–10.5)

## 2012-08-08 LAB — GLUCOSE, CAPILLARY
Glucose-Capillary: 174 mg/dL — ABNORMAL HIGH (ref 70–99)
Glucose-Capillary: 125 mg/dL — ABNORMAL HIGH (ref 70–99)

## 2012-08-08 MED ORDER — HYDRALAZINE HCL 25 MG PO TABS
25.0000 mg | ORAL_TABLET | Freq: Three times a day (TID) | ORAL | Status: DC
  Administered 2012-08-08 – 2012-08-10 (×5): 25 mg via ORAL
  Filled 2012-08-08 (×8): qty 1

## 2012-08-08 MED ORDER — VANCOMYCIN HCL 10 G IV SOLR
1250.0000 mg | Freq: Two times a day (BID) | INTRAVENOUS | Status: DC
  Administered 2012-08-09 – 2012-08-10 (×3): 1250 mg via INTRAVENOUS
  Filled 2012-08-08 (×4): qty 1250

## 2012-08-08 MED ORDER — VANCOMYCIN HCL 10 G IV SOLR
2000.0000 mg | Freq: Once | INTRAVENOUS | Status: AC
  Administered 2012-08-08: 2000 mg via INTRAVENOUS
  Filled 2012-08-08: qty 2000

## 2012-08-08 MED ORDER — PIPERACILLIN-TAZOBACTAM 3.375 G IVPB
3.3750 g | Freq: Three times a day (TID) | INTRAVENOUS | Status: DC
  Administered 2012-08-08 – 2012-08-09 (×2): 3.375 g via INTRAVENOUS
  Filled 2012-08-08 (×4): qty 50

## 2012-08-08 MED ORDER — GLIPIZIDE 2.5 MG HALF TABLET
2.5000 mg | ORAL_TABLET | Freq: Two times a day (BID) | ORAL | Status: DC
  Administered 2012-08-08 – 2012-08-10 (×4): 2.5 mg via ORAL
  Filled 2012-08-08 (×10): qty 1

## 2012-08-08 NOTE — Progress Notes (Signed)
VASCULAR LAB PRELIMINARY  PRELIMINARY  PRELIMINARY  PRELIMINARY  Bilateral lower extremity venous Dopplers completed.    Preliminary report:  There is no DVT or SVT noted in the bilateral lower extremities.  [name], RVT 08/08/2012, 4:16 PM

## 2012-08-08 NOTE — Progress Notes (Signed)
[facility] PROGRESS NOTE  [name] ZOX:[id number] DOB: 05-23-1955 DOA: 08/05/2012 PCP: No primary provider on file. Brief HPI:  [name] is a 57 y.o. female no history of diabetes mellitus type 2, hypertension, ongoing tobacco abuse, COPD started experiencing shortness of breath and fever chills over the last 2 days. Due to the symptoms being persistent patient presented to the ER. In the ER CT angiogram of the chest was negative for PE and UA was compatible with UTI. Patient has been in pretty start antibiotics and admitted for further management.   Assessment/Plan:  1. SIRS possible source could be UTI/ cellulitis - continue on Zosyn, add on vancomycin and follow blood cultures and urine cultures.  renal sonogram does not show obstruction.    2. Diabetes mellitus type 2 -patient has not been taking her medications for 2 years due to financial issues. hemoglobin A1c is 8.4 and sliding scale coverage for now. Add glipizide , as she could not tolerate metformin .  CBG (last 3)   Recent Labs  08/08/12 0746 08/08/12 1151 08/08/12 1641  GLUCAP 148* 174* 129*     3. Hypertension - patient has been placed on when necessary IV hydralazine for now. Closely follow blood pressure trends.  4.  COPD - not wheezing presently. Resume nebs as needed.   5. Tobacco abuse - strongly advised to quit smoking. Nicotine patch ordered  6. Chronic pain - continue home medications.   7. Stasis ulcer on the leg/ cellulitis of the left leg:  wound care consulted. Resume lasix.  Added vancomycin. Venous dopplers neg for DVT and x ray of the leg ordered .  8. DVT PROPHYLAXIS.    Code Status: full code.  Family Communication: noen at bedside.  Disposition Plan: pending. Possibly home when stable   Consultants:  None.  Antibiotics:  Zosyn  6/11  vanco 6/14  HPI/Subjective: Has diarrhea.   Denies any nausea or vomiting after admit.   Objective: Filed Vitals:   08/07/12 1300  08/07/12 1953 08/08/12 0454 08/08/12 1316  BP: 150/85 161/81 141/86 153/90  Pulse: 82 86 75 78  Temp: 98.2 F (36.8 C) 98.6 F (37 C) 98 F (36.7 C) 98 F (36.7 C)  TempSrc: Oral Oral Oral Oral  Resp: 18   17  Height:      Weight:   134.265 kg (296 lb)   SpO2: 95% 91% 94%    No intake or output data in the 24 hours ending 08/08/12 1758 Filed Weights   08/06/12 0455 08/07/12 0455 08/08/12 0454  Weight: 133.539 kg (294 lb 6.4 oz) 135.671 kg (299 lb 1.6 oz) 134.265 kg (296 lb)    Exam:  Cardiovascular: S1-S2 heard tachycardic.  Respiratory: No rhonchi or crepitations  Abdomen: Soft nontender bowel sounds present. Musculoskeletal: stasis ulcer on the calf with increased redness and swelling.  Neurologic: Alert awake oriented to time place and person. Moves   Data Reviewed: Basic Metabolic Panel:  Recent Labs Lab 08/05/12 1458 08/06/12 0512  NA 136 137  K 3.7 3.7  CL 97 99  CO2 27 32  GLUCOSE 303* 140*  BUN 12 13  CREATININE 0.93 1.04  CALCIUM 8.8 8.6   Liver Function Tests:  Recent Labs Lab 08/05/12 1458 08/06/12 0512  AST 32 19  ALT 26 20  ALKPHOS 113 94  BILITOT 0.5 0.5  PROT 7.6 6.8  ALBUMIN 4.0 3.3*   No results found for this basename: LIPASE, AMYLASE,  in the last 168 hours  No results found for this basename: AMMONIA,  in the last 168 hours CBC:  Recent Labs Lab 08/05/12 1357 08/06/12 0512 08/08/12 1305  WBC 16.2* 8.5 6.4  NEUTROABS 14.8* 6.6  --   HGB 16.8* 15.3* 15.5*  HCT 46.9* 44.8 44.9  MCV 84.5 86.8 87.0  PLT 178 162 201   Cardiac Enzymes:  Recent Labs Lab 08/05/12 2050  CKTOTAL 94  TROPONINI <0.30   BNP (last 3 results)  Recent Labs  08/05/12 1357  PROBNP 253.6*   CBG:  Recent Labs Lab 08/07/12 1635 08/07/12 2210 08/08/12 0746 08/08/12 1151 08/08/12 1641  GLUCAP 139* 157* 148* 174* 129*    Recent Results (from the past 240 hour(s))  URINE CULTURE     Status: None   Collection Time    08/05/12  2:42 PM       Result Value Range Status   Specimen Description URINE, CLEAN CATCH   Final   Special Requests NONE   Final   Culture  Setup Time     Final   Value: 08/05/2012 22:25     Two isolates with different morphologies were identified as the same organism.The most resistant organism was reported.   Colony Count >=100,000 COLONIES/ML   Final   Culture CITROBACTER KOSERI   Final   Report Status 08/08/2012 FINAL   Final   Organism ID, Bacteria CITROBACTER KOSERI   Final  CULTURE, BLOOD (ROUTINE X 2)     Status: None   Collection Time    08/05/12  3:00 PM      Result Value Range Status   Specimen Description BLOOD BLOOD RIGHT FOREARM   Final   Special Requests BOTTLES DRAWN AEROBIC ONLY 10CC   Final   Culture  Setup Time 08/05/2012 22:01   Final   Culture     Final   Value:        BLOOD CULTURE RECEIVED NO GROWTH TO DATE CULTURE WILL BE HELD FOR 5 DAYS BEFORE ISSUING A FINAL NEGATIVE REPORT   Report Status PENDING   Incomplete  CULTURE, BLOOD (ROUTINE X 2)     Status: None   Collection Time    08/05/12  3:09 PM      Result Value Range Status   Specimen Description BLOOD BLOOD LEFT FOREARM   Final   Special Requests BOTTLES DRAWN AEROBIC ONLY 4CC   Final   Culture  Setup Time 08/05/2012 22:01   Final   Culture     Final   Value:        BLOOD CULTURE RECEIVED NO GROWTH TO DATE CULTURE WILL BE HELD FOR 5 DAYS BEFORE ISSUING A FINAL NEGATIVE REPORT   Report Status PENDING   Incomplete  CLOSTRIDIUM DIFFICILE BY PCR     Status: None   Collection Time    08/08/12  1:33 PM      Result Value Range Status   C difficile by pcr NEGATIVE  NEGATIVE Final     Studies: No results found.  Scheduled Meds: . enoxaparin (LOVENOX) injection  65 mg Subcutaneous Q24H  . feeding supplement  30 mL Oral BID WC  . furosemide  40 mg Oral Daily  . glipiZIDE  2.5 mg Oral BID AC  . insulin aspart  0-5 Units Subcutaneous QHS  . insulin aspart  0-9 Units Subcutaneous TID WC  . methadone  40 mg Oral TID  .  nicotine  14 mg Transdermal Daily  . pregabalin  50 mg Oral TID  . sodium  chloride  3 mL Intravenous Q12H  . [START ON 08/09/2012] vancomycin  1,250 mg Intravenous Q12H  . vancomycin  2,000 mg Intravenous Once   Continuous Infusions:    Principal Problem:   SIRS (systemic inflammatory response syndrome) Active Problems:   DIABETES MELLITUS, TYPE II   Chronic pain syndrome   HYPERTENSION   COPD   UTI (lower urinary tract infection)   Diabetes mellitus   Tobacco abuse    Time spent: 25  minutes    Haivyn Oravec  [facility] Pager [phone] If 7PM-7AM, please contact night-coverage at [email or web address], password [facility] 08/08/2012, 5:58 PM  LOS: 3 days

## 2012-08-08 NOTE — Progress Notes (Signed)
Pt Left leg is more swollen and redder. Pt leg is warmer to palpation vs yesterday. Will update MD accordingly. Ancil Linsey RN

## 2012-08-08 NOTE — Progress Notes (Addendum)
ANTICOAGULATION CONSULT NOTE - Follow Up Consult  Pharmacy Consult for Lovenox + Zosyn Indication: VTE prophylaxis + UTI  Allergies  Allergen Reactions  . Ace Inhibitors     REACTION: angioedema    Patient Measurements: Height: 5' 4.96" (165 cm) Weight: 296 lb (134.265 kg) IBW/kg (Calculated) : 56.91 Heparin Dosing Weight:   Vital Signs: Temp: 98 F (36.7 C) (06/14 0454) Temp src: Oral (06/14 0454) BP: 141/86 mmHg (06/14 0454) Pulse Rate: 75 (06/14 0454)  Labs:  Recent Labs  08/05/12 1357 08/05/12 1458 08/05/12 2050 08/06/12 0512  HGB 16.8*  --   --  15.3*  HCT 46.9*  --   --  44.8  PLT 178  --   --  162  CREATININE  --  0.93  --  1.04  CKTOTAL  --   --  94  --   TROPONINI  --   --  <0.30  --     Estimated Creatinine Clearance: 85.8 ml/min (by C-G formula based on Cr of 1.04).   Assessment: SOB, fever, chills  AC: Lovenox 65mg  SQ daily for VTE prophylaxis with BMI > 30. CrCl 85. Hgb stable. Plts 202 to 162; will watch.  ID: Zosyn for GNR UTI, afebrile, WBC 8.5. Add Vanco for L leg redness, swelling. 6/11 Zosyn>> 6/14 Vanco>>  6/11 urine cx - GNR 6/11 blood cx x 2 - NGTD  CV: hx HTN - 141/86, HR 75 on po Lasix  Endo: hx DM with A1c 8.4%, CBGs 134-157 on SSI + metformin  GI/Nutr: LFTs WNL - carb modified diet, Prostat  Neuro: Chronic pain on nicotine patch, Lyrica, methadone  Renal: SCr 1.04, CrCL 86 ml/min, lytes WNL  Pulm: ongoing tobacco, COPD - stable on RA  Heme/Onc: hgb 15.3, plts WNL   Goal of Therapy:  DVT prophylaxis on LMWH Clearance of infection on Zosyn Vancomycin trough 10-15 Monitor platelets by anticoagulation protocol: Yes   Plan:  - Zosyn 3.375g IV Q8H, 4 hr infusion - Lovenox 65mg  IV Q24H - Add Vancomycin 20g IV load then 1250mg  IV q12h.  [name] S. Merilynn Finland, PharmD, BCPS Clinical Staff Pharmacist Pager [phone]  [name] 08/08/2012,8:27 AM

## 2012-08-09 LAB — BASIC METABOLIC PANEL
Chloride: 99 mEq/L (ref 96–112)
GFR calc Af Amer: 85 mL/min — ABNORMAL LOW (ref >90)
GFR calc non Af Amer: 73 mL/min — ABNORMAL LOW (ref >90)
Glucose, Bld: 179 mg/dL — ABNORMAL HIGH (ref 70–99)
Potassium: 3.1 mEq/L — ABNORMAL LOW (ref 3.5–5.1)
Sodium: 140 mEq/L (ref 135–145)

## 2012-08-09 LAB — GLUCOSE, CAPILLARY: Glucose-Capillary: 179 mg/dL — ABNORMAL HIGH (ref 70–99)

## 2012-08-09 MED ORDER — POTASSIUM CHLORIDE CRYS ER 20 MEQ PO TBCR
EXTENDED_RELEASE_TABLET | ORAL | Status: AC
  Filled 2012-08-09: qty 2

## 2012-08-09 MED ORDER — NYSTATIN 100000 UNIT/GM EX POWD
Freq: Three times a day (TID) | CUTANEOUS | Status: DC
  Administered 2012-08-09 – 2012-08-10 (×4): via TOPICAL
  Filled 2012-08-09: qty 15

## 2012-08-09 MED ORDER — ALBUTEROL SULFATE HFA 108 (90 BASE) MCG/ACT IN AERS
2.0000 | INHALATION_SPRAY | RESPIRATORY_TRACT | Status: DC | PRN
  Filled 2012-08-09: qty 6.7

## 2012-08-09 MED ORDER — POTASSIUM CHLORIDE CRYS ER 20 MEQ PO TBCR
40.0000 meq | EXTENDED_RELEASE_TABLET | Freq: Two times a day (BID) | ORAL | Status: AC
  Administered 2012-08-09 (×2): 40 meq via ORAL
  Filled 2012-08-09: qty 2

## 2012-08-09 MED ORDER — MOMETASONE FURO-FORMOTEROL FUM 100-5 MCG/ACT IN AERO
2.0000 | INHALATION_SPRAY | Freq: Two times a day (BID) | RESPIRATORY_TRACT | Status: DC
  Administered 2012-08-09 – 2012-08-10 (×3): 2 via RESPIRATORY_TRACT
  Filled 2012-08-09: qty 8.8

## 2012-08-09 MED ORDER — NYSTATIN 100000 UNIT/ML MT SUSP
5.0000 mL | Freq: Four times a day (QID) | OROMUCOSAL | Status: DC
  Administered 2012-08-09 – 2012-08-10 (×5): 500000 [IU] via ORAL
  Filled 2012-08-09 (×8): qty 5

## 2012-08-09 NOTE — Progress Notes (Signed)
K level at 3.1. Will continue to monitor.

## 2012-08-09 NOTE — Progress Notes (Signed)
[facility] PROGRESS NOTE  [name] ZOX:[id number] DOB: Aug 07, 1955 DOA: 08/05/2012 PCP: No primary provider on file. Brief HPI:  [name] is a 57 y.o. female no history of diabetes mellitus type 2, hypertension, ongoing tobacco abuse, COPD started experiencing shortness of breath and fever chills over the last 2 days. Due to the symptoms being persistent patient presented to the ER. In the ER CT angiogram of the chest was negative for PE and UA was compatible with UTI. Patient has been in pretty start antibiotics and admitted for further management.   Assessment/Plan:  1. SIRS possible source could be UTI/ cellulitis - urine cultures show citrobacter , add on vancomycin and follow blood cultures .  renal sonogram does not show obstruction.    2. Diabetes mellitus type 2 -patient has not been taking her medications for 2 years due to financial issues. hemoglobin A1c is 8.4 and sliding scale coverage for now. Add glipizide , as she could not tolerate metformin .  CBG (last 3)   Recent Labs  08/08/12 2114 08/09/12 0741 08/09/12 1211  GLUCAP 125* 146* 214*     3. Hypertension - patient has been placed on when necessary IV hydralazine for now. Closely follow blood pressure trends.  4.  COPD - not wheezing presently. Resume nebs as needed. Added dulera. Albuterol prn.   5. Tobacco abuse - strongly advised to quit smoking. Nicotine patch ordered  6. Chronic pain - continue home medications.   7. Stasis ulcer on the leg/ cellulitis of the left leg:  wound care consulted. Resume lasix.  Added vancomycin. Venous dopplers neg for DVT and x ray of the leg ordered .  8. DVT PROPHYLAXIS.   9. Hypokalemia: replete as needed. Magnesium level ordered.    Code Status: full code.  Family Communication: none at bedside.  Disposition Plan: pending. Possibly home when stable   Consultants:  None.  Antibiotics:  Zosyn  6/11-6/15  vanco 6/14  HPI/Subjective: Has diarrhea.  , cidff negative. Resolved.   Denies any nausea or vomiting after admit.   Objective: Filed Vitals:   08/08/12 2114 08/08/12 2156 08/09/12 0555 08/09/12 1305  BP: 127/72 127/72 148/76 168/93  Pulse:  74 77 86  Temp:  97.2 F (36.2 C) 97.8 F (36.6 C) 98.8 F (37.1 C)  TempSrc:  Oral Oral Oral  Resp:  19 18 19   Height:      Weight:      SpO2:  95% 93% 94%    Intake/Output Summary (Last 24 hours) at 08/09/12 1636 Last data filed at 08/09/12 1300  Gross per 24 hour  Intake    720 ml  Output      2 ml  Net    718 ml   Filed Weights   08/06/12 0455 08/07/12 0455 08/08/12 0454  Weight: 133.539 kg (294 lb 6.4 oz) 135.671 kg (299 lb 1.6 oz) 134.265 kg (296 lb)    Exam:  Cardiovascular: S1-S2 heard tachycardic.  Respiratory: No rhonchi or crepitations  Abdomen: Soft nontender bowel sounds present. Musculoskeletal: stasis ulcer on the calf with increased redness and swelling.  Neurologic: Alert awake oriented to time place and person. Moves   Data Reviewed: Basic Metabolic Panel:  Recent Labs Lab 08/05/12 1458 08/06/12 0512 08/09/12 0440  NA 136 137 140  K 3.7 3.7 3.1*  CL 97 99 99  CO2 27 32 36*  GLUCOSE 303* 140* 179*  BUN 12 13 16   CREATININE 0.93 1.04 0.88  CALCIUM 8.8  8.6 9.4   Liver Function Tests:  Recent Labs Lab 08/05/12 1458 08/06/12 0512  AST 32 19  ALT 26 20  ALKPHOS 113 94  BILITOT 0.5 0.5  PROT 7.6 6.8  ALBUMIN 4.0 3.3*   No results found for this basename: LIPASE, AMYLASE,  in the last 168 hours No results found for this basename: AMMONIA,  in the last 168 hours CBC:  Recent Labs Lab 08/05/12 1357 08/06/12 0512 08/08/12 1305  WBC 16.2* 8.5 6.4  NEUTROABS 14.8* 6.6  --   HGB 16.8* 15.3* 15.5*  HCT 46.9* 44.8 44.9  MCV 84.5 86.8 87.0  PLT 178 162 201   Cardiac Enzymes:  Recent Labs Lab 08/05/12 2050  CKTOTAL 94  TROPONINI <0.30   BNP (last 3 results)  Recent Labs  08/05/12 1357  PROBNP 253.6*   CBG:  Recent  Labs Lab 08/08/12 1151 08/08/12 1641 08/08/12 2114 08/09/12 0741 08/09/12 1211  GLUCAP 174* 129* 125* 146* 214*    Recent Results (from the past 240 hour(s))  URINE CULTURE     Status: None   Collection Time    08/05/12  2:42 PM      Result Value Range Status   Specimen Description URINE, CLEAN CATCH   Final   Special Requests NONE   Final   Culture  Setup Time     Final   Value: 08/05/2012 22:25     Two isolates with different morphologies were identified as the same organism.The most resistant organism was reported.   Colony Count >=100,000 COLONIES/ML   Final   Culture CITROBACTER KOSERI   Final   Report Status 08/08/2012 FINAL   Final   Organism ID, Bacteria CITROBACTER KOSERI   Final  CULTURE, BLOOD (ROUTINE X 2)     Status: None   Collection Time    08/05/12  3:00 PM      Result Value Range Status   Specimen Description BLOOD BLOOD RIGHT FOREARM   Final   Special Requests BOTTLES DRAWN AEROBIC ONLY 10CC   Final   Culture  Setup Time 08/05/2012 22:01   Final   Culture     Final   Value:        BLOOD CULTURE RECEIVED NO GROWTH TO DATE CULTURE WILL BE HELD FOR 5 DAYS BEFORE ISSUING A FINAL NEGATIVE REPORT   Report Status PENDING   Incomplete  CULTURE, BLOOD (ROUTINE X 2)     Status: None   Collection Time    08/05/12  3:09 PM      Result Value Range Status   Specimen Description BLOOD BLOOD LEFT FOREARM   Final   Special Requests BOTTLES DRAWN AEROBIC ONLY 4CC   Final   Culture  Setup Time 08/05/2012 22:01   Final   Culture     Final   Value:        BLOOD CULTURE RECEIVED NO GROWTH TO DATE CULTURE WILL BE HELD FOR 5 DAYS BEFORE ISSUING A FINAL NEGATIVE REPORT   Report Status PENDING   Incomplete  CLOSTRIDIUM DIFFICILE BY PCR     Status: None   Collection Time    08/08/12  1:33 PM      Result Value Range Status   C difficile by pcr NEGATIVE  NEGATIVE Final     Studies: Dg Tibia/fibula Left  08/08/2012   *RADIOLOGY REPORT*  Clinical Data: Lower left leg pain,  swelling and cellulitis.  LEFT TIBIA AND FIBULA - 2 VIEW  Comparison: None.  Findings: Diffuse  soft tissue swelling.  Extensive tricompartmental knee degenerative spur formation with marked medial joint space narrowing.  Bandage material posteriorly in the distal lower leg. No soft tissue gas, bone destruction or periosteal reaction.  Some of the tarsal bones and metatarsals appear fused.  IMPRESSION:  1.  Distal soft tissue bandage and diffuse soft tissue swelling without evidence of underlying osteomyelitis. 2.  Marked left knee tricompartmental degenerative changes. 3.  Tarsal/metatarsal fusion.   Original Report Authenticated By: Beckie Salts, M.D.    Scheduled Meds: . enoxaparin (LOVENOX) injection  65 mg Subcutaneous Q24H  . feeding supplement  30 mL Oral BID WC  . furosemide  40 mg Oral Daily  . glipiZIDE  2.5 mg Oral BID AC  . hydrALAZINE  25 mg Oral Q8H  . insulin aspart  0-5 Units Subcutaneous QHS  . insulin aspart  0-9 Units Subcutaneous TID WC  . methadone  40 mg Oral TID  . mometasone-formoterol  2 puff Inhalation BID  . nicotine  14 mg Transdermal Daily  . nystatin  5 mL Oral QID  . nystatin   Topical TID  . potassium chloride SA      . potassium chloride  40 mEq Oral BID  . pregabalin  50 mg Oral TID  . sodium chloride  3 mL Intravenous Q12H  . vancomycin  1,250 mg Intravenous Q12H   Continuous Infusions:    Principal Problem:   SIRS (systemic inflammatory response syndrome) Active Problems:   DIABETES MELLITUS, TYPE II   Chronic pain syndrome   HYPERTENSION   COPD   UTI (lower urinary tract infection)   Diabetes mellitus   Tobacco abuse    Time spent: 25  minutes    Kino Dunsworth  [facility] Pager [phone] If 7PM-7AM, please contact night-coverage at [email or web address], password [facility] 08/09/2012, 4:36 PM  LOS: 4 days

## 2012-08-10 LAB — GLUCOSE, CAPILLARY: Glucose-Capillary: 174 mg/dL — ABNORMAL HIGH (ref 70–99)

## 2012-08-10 MED ORDER — HYDRALAZINE HCL 50 MG PO TABS: 50.0000 mg | ORAL_TABLET | Freq: Three times a day (TID) | ORAL | Status: DC

## 2012-08-10 MED ORDER — ALBUTEROL SULFATE HFA 108 (90 BASE) MCG/ACT IN AERS: 2.0000 | INHALATION_SPRAY | RESPIRATORY_TRACT | Status: DC | PRN

## 2012-08-10 MED ORDER — GLIPIZIDE 2.5 MG HALF TABLET: 2.5000 mg | ORAL_TABLET | Freq: Two times a day (BID) | ORAL | Status: DC

## 2012-08-10 MED ORDER — PRO-STAT SUGAR FREE PO LIQD: 30.0000 mL | Freq: Two times a day (BID) | ORAL | Status: DC

## 2012-08-10 MED ORDER — FUROSEMIDE 40 MG PO TABS: 40.0000 mg | ORAL_TABLET | Freq: Every day | ORAL | Status: DC

## 2012-08-10 MED ORDER — NICOTINE 14 MG/24HR TD PT24: 1.0000 | MEDICATED_PATCH | Freq: Every day | TRANSDERMAL | Status: DC

## 2012-08-10 MED ORDER — DOXYCYCLINE HYCLATE 100 MG PO TABS: 100.0000 mg | ORAL_TABLET | Freq: Two times a day (BID) | ORAL | Status: DC

## 2012-08-10 MED ORDER — POTASSIUM CHLORIDE CRYS ER 20 MEQ PO TBCR
40.0000 meq | EXTENDED_RELEASE_TABLET | Freq: Four times a day (QID) | ORAL | Status: DC
  Administered 2012-08-10: 40 meq via ORAL
  Filled 2012-08-10: qty 2

## 2012-08-10 MED ORDER — HYDRALAZINE HCL 50 MG PO TABS
50.0000 mg | ORAL_TABLET | Freq: Three times a day (TID) | ORAL | Status: DC
  Administered 2012-08-10: 100 mg via ORAL
  Filled 2012-08-10 (×3): qty 1

## 2012-08-10 MED ORDER — MOMETASONE FURO-FORMOTEROL FUM 100-5 MCG/ACT IN AERO: 2.0000 | INHALATION_SPRAY | Freq: Two times a day (BID) | RESPIRATORY_TRACT | Status: DC

## 2012-08-10 MED ORDER — POTASSIUM CHLORIDE CRYS ER 20 MEQ PO TBCR: 20.0000 meq | EXTENDED_RELEASE_TABLET | Freq: Every day | ORAL | Status: DC

## 2012-08-10 MED ORDER — DOXYCYCLINE HYCLATE 100 MG PO TABS
100.0000 mg | ORAL_TABLET | Freq: Two times a day (BID) | ORAL | Status: DC
  Administered 2012-08-10: 100 mg via ORAL
  Filled 2012-08-10 (×2): qty 1

## 2012-08-10 NOTE — Discharge Summary (Signed)
Physician Discharge Summary  [name] ZOX:[id number] DOB: Jul 28, 1955 DOA: 08/05/2012  PCP: No primary provider on file.  Admit date: 08/05/2012 Discharge date: 08/10/2012  Time spent: 30 minutes  Recommendations for Outpatient Follow-up:  1. Follow up with PCP in one week.   Discharge Diagnoses:  Principal Problem:   SIRS (systemic inflammatory response syndrome) Active Problems:   DIABETES MELLITUS, TYPE II   Chronic pain syndrome   HYPERTENSION   COPD   UTI (lower urinary tract infection)   Diabetes mellitus   Tobacco abuse   Discharge Condition: improved.   Diet recommendation: carb modified, low sodium  Filed Weights   08/07/12 0455 08/08/12 0454 08/10/12 0604  Weight: 135.671 kg (299 lb 1.6 oz) 134.265 kg (296 lb) 135 kg (297 lb 9.9 oz)    History of present illness:  [name] is a 57 y.o. female no history of diabetes mellitus type 2, hypertension, ongoing tobacco abuse, COPD started experiencing shortness of breath and fever chills over the last 2 days. Due to the symptoms being persistent patient presented to the ER. In the ER CT angiogram of the chest was negative for PE and UA was compatible with UTI. Patient has been in pretty start antibiotics and admitted for further management. Patient has been not taking her medications for last 2 years due to financial issues. Denies any chest pain nausea vomiting diarrhea. Has chronic back pain and is on chronic pain medications. Renal sonogram is pending to check for any nephritis and obstruction.   Hospital Course:  1. SIRS possible source could be UTI/ cellulitis - urine cultures show citrobacter , she received IV antibiotics with IV vancomycin and IV zosyn and is being discharged on oral doxycycline.  2. Diabetes mellitus type 2 -patient has not been taking her medications for 2 years due to financial issues. hemoglobin A1c is 8.4 and sliding scale coverage for now. Add glipizide , as she could not tolerate metformin .  Sh eis recommended tofollow up with PCP and re check hgba1c in 2 to 3 months and add on insulin to her regimen depending on her hgba1c.  CBG (last 3)   Recent Labs   08/08/12 2114  08/09/12 0741  08/09/12 1211   GLUCAP  125*  146*  214*    3. Hypertension - patient has been placed on when necessary IV hydralazine during hospitalization.  4. COPD - not wheezing presently. Resume nebs as needed. Added dulera. Albuterol prn.  5. Tobacco abuse - strongly advised to quit smoking. Nicotine patch ordered  6. Chronic pain - continue home medications.  7. Stasis ulcer on the leg/ cellulitis of the left leg: wound care consulted. Resume lasix.  . Venous dopplers neg for DVT and x ray of the leg ordered , neg for osteomyelitis , she was treated with IV VANCO and is being discharged on oral doxycyline.   9. Hypokalemia: replete as needed. Magnesium level ordered.    Procedures:  Venous dopplers  Consultations:  none  Discharge Exam: Filed Vitals:   08/09/12 1305 08/09/12 2051 08/09/12 2124 08/10/12 0604  BP: 168/93  156/76 173/81  Pulse: 86  79 83  Temp: 98.8 F (37.1 C)  98.3 F (36.8 C) 97.9 F (36.6 C)  TempSrc: Oral  Oral Oral  Resp: 19  18 18   Height:      Weight:    135 kg (297 lb 9.9 oz)  SpO2: 94% 93% 95% 97%  Cardiovascular: S1-S2 heard tachycardic.  Respiratory: No rhonchi  or crepitations  Abdomen: Soft nontender bowel sounds present.  Musculoskeletal: stasis ulcer on the calf with increased redness and swelling.  Neurologic: Alert awake oriented to time place and person.      Discharge Instructions  Discharge Orders   Future Orders Complete By Expires     Diet - low sodium heart healthy  As directed     Discharge instructions  As directed     Comments:      Follow up with PCP in one week.        Medication List    TAKE these medications       albuterol 108 (90 BASE) MCG/ACT inhaler  Commonly known as:  PROVENTIL HFA;VENTOLIN HFA  Inhale 2 puffs into  the lungs every 4 (four) hours as needed for wheezing or shortness of breath.     doxycycline 100 MG tablet  Commonly known as:  VIBRA-TABS  Take 1 tablet (100 mg total) by mouth every 12 (twelve) hours.     feeding supplement Liqd  Take 30 mLs by mouth 2 (two) times daily with a meal.     furosemide 40 MG tablet  Commonly known as:  LASIX  Take 1 tablet (40 mg total) by mouth daily.     glipiZIDE 2.5 mg Tabs  Commonly known as:  GLUCOTROL  Take 0.5 tablets (2.5 mg total) by mouth 2 (two) times daily before a meal.     hydrALAZINE 50 MG tablet  Commonly known as:  APRESOLINE  Take 1 tablet (50 mg total) by mouth every 8 (eight) hours.     ibuprofen 200 MG tablet  Commonly known as:  ADVIL,MOTRIN  Take 400 mg by mouth every 6 (six) hours as needed for pain.     methadone 10 MG tablet  Commonly known as:  DOLOPHINE  Take 40 mg by mouth 3 (three) times daily.     mometasone-formoterol 100-5 MCG/ACT Aero  Commonly known as:  DULERA  Inhale 2 puffs into the lungs 2 (two) times daily.     nicotine 14 mg/24hr patch  Commonly known as:  NICODERM CQ - dosed in mg/24 hours  Place 1 patch onto the skin daily.     potassium chloride SA 20 MEQ tablet  Commonly known as:  K-DUR,KLOR-CON  Take 1 tablet (20 mEq total) by mouth daily.     pregabalin 50 MG capsule  Commonly known as:  LYRICA  Take 50 mg by mouth 3 (three) times daily.       Allergies  Allergen Reactions  . Ace Inhibitors     REACTION: angioedema      The results of significant diagnostics from this hospitalization (including imaging, microbiology, ancillary and laboratory) are listed below for reference.    Significant Diagnostic Studies: Dg Chest 2 View  08/05/2012   *RADIOLOGY REPORT*  Clinical Data: Shortness of breath.  Chest pain and leg swelling  CHEST - 2 VIEW  Comparison: 08/27/2004  Findings: The heart size and mediastinal contours are within normal limits.  Both lungs are clear.  The visualized  skeletal structures are remarkable for mild spondylosis.  IMPRESSION: Negative exam.   Original Report Authenticated By: Signa Kell, M.D.   Dg Tibia/fibula Left  08/08/2012   *RADIOLOGY REPORT*  Clinical Data: Lower left leg pain, swelling and cellulitis.  LEFT TIBIA AND FIBULA - 2 VIEW  Comparison: None.  Findings: Diffuse soft tissue swelling.  Extensive tricompartmental knee degenerative spur formation with marked medial joint space narrowing.  Bandage material  posteriorly in the distal lower leg. No soft tissue gas, bone destruction or periosteal reaction.  Some of the tarsal bones and metatarsals appear fused.  IMPRESSION:  1.  Distal soft tissue bandage and diffuse soft tissue swelling without evidence of underlying osteomyelitis. 2.  Marked left knee tricompartmental degenerative changes. 3.  Tarsal/metatarsal fusion.   Original Report Authenticated By: Beckie Salts, M.D.   Ct Angio Chest W/cm &/or Wo Cm  08/05/2012   *RADIOLOGY REPORT*  Clinical Data: Shortness of breath.  Cough and fever.  CT ANGIOGRAPHY CHEST  Technique:  Multidetector CT imaging of the chest using the standard protocol during bolus administration of intravenous contrast. Multiplanar reconstructed images including MIPs were obtained and reviewed to evaluate the vascular anatomy.  Contrast: 80mL OMNIPAQUE IOHEXOL 350 MG/ML SOLN  Comparison: Two-view chest 08/05/2012.  CTA chest 08/08/2004.  Findings: Pulmonary arterial opacification is suboptimal.  No significant proximal emboli are present within the lobar arteries. Subsegmental vessels are incompletely evaluated.  Heart size is normal.  No significant mediastinal or axillary adenopathy is present.  There is no significant pleural or pericardial effusion.  Diffuse fatty infiltration of the liver is again noted.  Limited imaging of the abdomen is otherwise unremarkable.  A 5 mm nodule in the right middle lobe is not significantly changed.  Minimal atelectasis is present at both lung  bases.  No other focal nodule, mass, or airspace disease is evident.  The bone windows demonstrate mild degenerative changes in the thoracic spine with slight rightward curvature.  IMPRESSION:  1.  No evidence of pulmonary embolus. 2.  Evaluation of distal subsegmental vessels is somewhat limited by a suboptimal contrast bolus. 3.  Stable 5 mm right middle lobe nodule. 4.  No acute or focal abnormality to explain the patient's symptoms otherwise. 5.  Mild degenerative changes of the thoracic spine.   Original Report Authenticated By: Marin Roberts, M.D.   US Renal  08/06/2012   *RADIOLOGY REPORT*  Clinical Data: 57 year old female with dysuria.  RENAL/URINARY TRACT ULTRASOUND COMPLETE  Comparison:  None  Findings:  Right Kidney:  The right kidney is normal in echogenicity and size measuring 13.2 cm.  There is no evidence of hydronephrosis, solid renal mass or definite renal calculi.  Left Kidney:  The left kidney is normal in echogenicity and size measuring 13.6 cm.  There is no evidence of hydronephrosis, solid renal mass or definite renal calculi.  Bladder:  The bladder is not well distended but appears normal for degree of filling.  IMPRESSION: Unremarkable renal ultrasound.   Original Report Authenticated By: Harmon Pier, M.D.    Microbiology: Recent Results (from the past 240 hour(s))  URINE CULTURE     Status: None   Collection Time    08/05/12  2:42 PM      Result Value Range Status   Specimen Description URINE, CLEAN CATCH   Final   Special Requests NONE   Final   Culture  Setup Time     Final   Value: 08/05/2012 22:25     Two isolates with different morphologies were identified as the same organism.The most resistant organism was reported.   Colony Count >=100,000 COLONIES/ML   Final   Culture CITROBACTER KOSERI   Final   Report Status 08/08/2012 FINAL   Final   Organism ID, Bacteria CITROBACTER KOSERI   Final  CULTURE, BLOOD (ROUTINE X 2)     Status: None   Collection Time     08/05/12  3:00 PM  Result Value Range Status   Specimen Description BLOOD BLOOD RIGHT FOREARM   Final   Special Requests BOTTLES DRAWN AEROBIC ONLY 10CC   Final   Culture  Setup Time 08/05/2012 22:01   Final   Culture     Final   Value:        BLOOD CULTURE RECEIVED NO GROWTH TO DATE CULTURE WILL BE HELD FOR 5 DAYS BEFORE ISSUING A FINAL NEGATIVE REPORT   Report Status PENDING   Incomplete  CULTURE, BLOOD (ROUTINE X 2)     Status: None   Collection Time    08/05/12  3:09 PM      Result Value Range Status   Specimen Description BLOOD BLOOD LEFT FOREARM   Final   Special Requests BOTTLES DRAWN AEROBIC ONLY 4CC   Final   Culture  Setup Time 08/05/2012 22:01   Final   Culture     Final   Value:        BLOOD CULTURE RECEIVED NO GROWTH TO DATE CULTURE WILL BE HELD FOR 5 DAYS BEFORE ISSUING A FINAL NEGATIVE REPORT   Report Status PENDING   Incomplete  CLOSTRIDIUM DIFFICILE BY PCR     Status: None   Collection Time    08/08/12  1:33 PM      Result Value Range Status   C difficile by pcr NEGATIVE  NEGATIVE Final     Labs: Basic Metabolic Panel:  Recent Labs Lab 08/05/12 1458 08/06/12 0512 08/09/12 0440 08/10/12 0942  NA 136 137 140  --   K 3.7 3.7 3.1* 3.3*  CL 97 99 99  --   CO2 27 32 36*  --   GLUCOSE 303* 140* 179*  --   BUN 12 13 16   --   CREATININE 0.93 1.04 0.88  --   CALCIUM 8.8 8.6 9.4  --   MG  --   --  2.3  --    Liver Function Tests:  Recent Labs Lab 08/05/12 1458 08/06/12 0512  AST 32 19  ALT 26 20  ALKPHOS 113 94  BILITOT 0.5 0.5  PROT 7.6 6.8  ALBUMIN 4.0 3.3*   No results found for this basename: LIPASE, AMYLASE,  in the last 168 hours No results found for this basename: AMMONIA,  in the last 168 hours CBC:  Recent Labs Lab 08/05/12 1357 08/06/12 0512 08/08/12 1305  WBC 16.2* 8.5 6.4  NEUTROABS 14.8* 6.6  --   HGB 16.8* 15.3* 15.5*  HCT 46.9* 44.8 44.9  MCV 84.5 86.8 87.0  PLT 178 162 201   Cardiac Enzymes:  Recent Labs Lab  08/05/12 2050  CKTOTAL 94  TROPONINI <0.30   BNP: BNP (last 3 results)  Recent Labs  08/05/12 1357  PROBNP 253.6*   CBG:  Recent Labs Lab 08/09/12 0741 08/09/12 1211 08/09/12 1653 08/09/12 2117 08/10/12 0747  GLUCAP 146* 214* 169* 179* 174*       Signed:  Aadan Chenier  [facility] 08/10/2012, 11:09 AM

## 2012-08-11 LAB — CULTURE, BLOOD (ROUTINE X 2): Culture: NO GROWTH

## 2012-10-29 ENCOUNTER — Encounter (HOSPITAL_BASED_OUTPATIENT_CLINIC_OR_DEPARTMENT_OTHER): Payer: Medicaid Other

## 2012-12-31 ENCOUNTER — Other Ambulatory Visit: Payer: Self-pay

## 2013-01-17 ENCOUNTER — Encounter (HOSPITAL_COMMUNITY): Payer: Self-pay | Admitting: Emergency Medicine

## 2013-01-17 ENCOUNTER — Emergency Department (HOSPITAL_COMMUNITY)
Admission: EM | Admit: 2013-01-17 | Discharge: 2013-01-17 | Disposition: A | Discharge status: Home / Self Care | Payer: Medicaid Other | Attending: Emergency Medicine | Admitting: Emergency Medicine

## 2013-01-17 ENCOUNTER — Emergency Department (HOSPITAL_COMMUNITY): Payer: Medicaid Other

## 2013-01-17 DIAGNOSIS — R739 Hyperglycemia, unspecified: Secondary | ICD-10-CM

## 2013-01-17 DIAGNOSIS — J449 Chronic obstructive pulmonary disease, unspecified: Secondary | ICD-10-CM | POA: Insufficient documentation

## 2013-01-17 DIAGNOSIS — F329 Major depressive disorder, single episode, unspecified: Secondary | ICD-10-CM | POA: Insufficient documentation

## 2013-01-17 DIAGNOSIS — R109 Unspecified abdominal pain: Secondary | ICD-10-CM | POA: Insufficient documentation

## 2013-01-17 DIAGNOSIS — M129 Arthropathy, unspecified: Secondary | ICD-10-CM | POA: Insufficient documentation

## 2013-01-17 DIAGNOSIS — I10 Essential (primary) hypertension: Secondary | ICD-10-CM | POA: Insufficient documentation

## 2013-01-17 DIAGNOSIS — F17200 Nicotine dependence, unspecified, uncomplicated: Secondary | ICD-10-CM | POA: Insufficient documentation

## 2013-01-17 DIAGNOSIS — J4489 Other specified chronic obstructive pulmonary disease: Secondary | ICD-10-CM | POA: Insufficient documentation

## 2013-01-17 DIAGNOSIS — Z79899 Other long term (current) drug therapy: Secondary | ICD-10-CM | POA: Insufficient documentation

## 2013-01-17 DIAGNOSIS — N390 Urinary tract infection, site not specified: Secondary | ICD-10-CM | POA: Insufficient documentation

## 2013-01-17 DIAGNOSIS — F3289 Other specified depressive episodes: Secondary | ICD-10-CM | POA: Insufficient documentation

## 2013-01-17 DIAGNOSIS — E119 Type 2 diabetes mellitus without complications: Secondary | ICD-10-CM | POA: Insufficient documentation

## 2013-01-17 LAB — CBC WITH DIFFERENTIAL/PLATELET
Eosinophils Relative: 1 % (ref 0–5)
HCT: 41.9 % (ref 36.0–46.0)
Hemoglobin: 14.6 g/dL (ref 12.0–15.0)
Lymphocytes Relative: 19 % (ref 12–46)
Lymphs Abs: 2.1 10*3/uL (ref 0.7–4.0)
MCV: 82.2 fL (ref 78.0–100.0)
Monocytes Absolute: 0.5 10*3/uL (ref 0.1–1.0)
Monocytes Relative: 5 % (ref 3–12)
RBC: 5.1 MIL/uL (ref 3.87–5.11)
WBC: 10.9 10*3/uL — ABNORMAL HIGH (ref 4.0–10.5)

## 2013-01-17 LAB — POCT I-STAT, CHEM 8
Creatinine, Ser: 1.4 mg/dL — ABNORMAL HIGH (ref 0.50–1.10)
Hemoglobin: 16.3 g/dL — ABNORMAL HIGH (ref 12.0–15.0)
Potassium: 3.1 mEq/L — ABNORMAL LOW (ref 3.5–5.1)
Sodium: 133 mEq/L — ABNORMAL LOW (ref 135–145)

## 2013-01-17 LAB — URINE MICROSCOPIC-ADD ON

## 2013-01-17 LAB — URINALYSIS, ROUTINE W REFLEX MICROSCOPIC
Bilirubin Urine: NEGATIVE
Glucose, UA: >1000 mg/dL — AB
Ketones, ur: NEGATIVE mg/dL
Leukocytes, UA: NEGATIVE
pH: 6 (ref 5.0–8.0)

## 2013-01-17 LAB — GLUCOSE, CAPILLARY: Glucose-Capillary: 290 mg/dL — ABNORMAL HIGH (ref 70–99)

## 2013-01-17 MED ORDER — ONDANSETRON HCL 4 MG/2ML IJ SOLN
4.0000 mg | Freq: Once | INTRAMUSCULAR | Status: AC
  Administered 2013-01-17: 4 mg via INTRAVENOUS
  Filled 2013-01-17: qty 2

## 2013-01-17 MED ORDER — CEPHALEXIN 500 MG PO CAPS: 500.0000 mg | ORAL_CAPSULE | Freq: Four times a day (QID) | ORAL | Status: DC

## 2013-01-17 MED ORDER — INSULIN ASPART 100 UNIT/ML ~~LOC~~ SOLN
10.0000 [IU] | Freq: Once | SUBCUTANEOUS | Status: AC
  Administered 2013-01-17: 10 [IU] via SUBCUTANEOUS
  Filled 2013-01-17: qty 1

## 2013-01-17 MED ORDER — POTASSIUM CHLORIDE CRYS ER 20 MEQ PO TBCR
40.0000 meq | EXTENDED_RELEASE_TABLET | Freq: Once | ORAL | Status: AC
  Administered 2013-01-17: 40 meq via ORAL
  Filled 2013-01-17: qty 2

## 2013-01-17 MED ORDER — PROMETHAZINE HCL 25 MG PO TABS: 25.0000 mg | ORAL_TABLET | Freq: Four times a day (QID) | ORAL | Status: DC | PRN

## 2013-01-17 MED ORDER — INSULIN ASPART 100 UNIT/ML IV SOLN
10.0000 [IU] | Freq: Once | INTRAVENOUS | Status: AC
  Administered 2013-01-17: 10 [IU] via INTRAVENOUS
  Filled 2013-01-17: qty 0.1

## 2013-01-17 MED ORDER — SODIUM CHLORIDE 0.9 % IV BOLUS (SEPSIS)
1000.0000 mL | Freq: Once | INTRAVENOUS | Status: AC
  Administered 2013-01-17: 1000 mL via INTRAVENOUS

## 2013-01-17 MED ORDER — RANITIDINE HCL 150 MG PO CAPS: 150.0000 mg | ORAL_CAPSULE | Freq: Every day | ORAL | Status: DC

## 2013-01-17 MED ORDER — DEXTROSE 5 % IV SOLN
1.0000 g | Freq: Once | INTRAVENOUS | Status: AC
  Administered 2013-01-17: 1 g via INTRAVENOUS
  Filled 2013-01-17: qty 10

## 2013-01-17 NOTE — ED Provider Notes (Signed)
CSN: [id number]     Arrival date & time 01/17/13  1331 History   First MD Initiated Contact with Patient 01/17/13 1348     Chief Complaint  Patient presents with  . Weakness   (Consider location/radiation/quality/duration/timing/severity/associated sxs/prior Treatment) Patient is a 57 y.o. female presenting with weakness. The history is provided by the patient (the pt complains of weakness and urinating alot).  Weakness This is a recurrent problem. The current episode started 2 days ago. The problem occurs constantly. The problem has not changed since onset.Associated symptoms include abdominal pain. Pertinent negatives include no chest pain and no headaches. Nothing aggravates the symptoms. Nothing relieves the symptoms.    Past Medical History  Diagnosis Date  . Hypertension   . Diabetes mellitus without complication   . COPD (chronic obstructive pulmonary disease)   . Shortness of breath   . Depression   . Arthritis    Past Surgical History  Procedure Laterality Date  . Cholecystectomy    . Tubal ligation    . Carpel tunnel    . Breast surgery      LUMPECTOMY    Family History  Problem Relation Age of Onset  . Adopted: Yes   History  Substance Use Topics  . Smoking status: Current Every Day Smoker -- 1.00 packs/day for 40 years    Types: Cigarettes  . Smokeless tobacco: Never Used  . Alcohol Use: No   OB History   Grav Para Term Preterm Abortions TAB SAB Ect Mult Living                 Review of Systems  Constitutional: Negative for appetite change and fatigue.  HENT: Negative for congestion, ear discharge and sinus pressure.   Eyes: Negative for discharge.  Respiratory: Negative for cough.   Cardiovascular: Negative for chest pain.  Gastrointestinal: Positive for abdominal pain. Negative for diarrhea.  Genitourinary: Negative for frequency and hematuria.  Musculoskeletal: Negative for back pain.  Skin: Negative for rash.  Neurological: Positive for  weakness. Negative for seizures and headaches.  Psychiatric/Behavioral: Negative for hallucinations.    Allergies  Ace inhibitors  Home Medications   Current Outpatient Rx  Name  Route  Sig  Dispense  Refill  . albuterol (PROVENTIL HFA;VENTOLIN HFA) 108 (90 BASE) MCG/ACT inhaler   Inhalation   Inhale 2 puffs into the lungs every 4 (four) hours as needed for wheezing or shortness of breath.   18 g   1   . furosemide (LASIX) 40 MG tablet   Oral   Take 1 tablet (40 mg total) by mouth daily.   30 tablet   1   . glipiZIDE (GLUCOTROL) 2.5 mg TABS   Oral   Take 0.5 tablets (2.5 mg total) by mouth 2 (two) times daily before a meal.   60 tablet   1   . hydrALAZINE (APRESOLINE) 50 MG tablet   Oral   Take 1 tablet (50 mg total) by mouth every 8 (eight) hours.   90 tablet   1   . ibuprofen (ADVIL,MOTRIN) 200 MG tablet   Oral   Take 400-600 mg by mouth every 6 (six) hours as needed for fever, headache or moderate pain.          . methadone (DOLOPHINE) 10 MG tablet   Oral   Take 40 mg by mouth every 8 (eight) hours.          . nicotine (NICODERM CQ - DOSED IN MG/24 HOURS) 14 mg/24hr patch  Transdermal   Place 14 mg onto the skin daily.         . potassium chloride SA (K-DUR,KLOR-CON) 20 MEQ tablet   Oral   Take 1 tablet (20 mEq total) by mouth daily.   30 tablet   0   . pregabalin (LYRICA) 50 MG capsule   Oral   Take 50 mg by mouth every 8 (eight) hours. 0600, 1400, 2200         . cephALEXin (KEFLEX) 500 MG capsule   Oral   Take 1 capsule (500 mg total) by mouth 4 (four) times daily.   28 capsule   0   . promethazine (PHENERGAN) 25 MG tablet   Oral   Take 1 tablet (25 mg total) by mouth every 6 (six) hours as needed for nausea or vomiting.   15 tablet   0   . ranitidine (ZANTAC) 150 MG capsule   Oral   Take 1 capsule (150 mg total) by mouth daily.   30 capsule   0    BP 172/94  Pulse 96  Temp(Src) 99.4 F (37.4 C) (Oral)  Resp 19  SpO2  95% Physical Exam  Constitutional: She is oriented to person, place, and time. She appears well-developed.  HENT:  Head: Normocephalic.  Mucous membranes dry  Eyes: Conjunctivae and EOM are normal. No scleral icterus.  Neck: Neck supple. No thyromegaly present.  Cardiovascular: Normal rate and regular rhythm.  Exam reveals no gallop and no friction rub.   No murmur heard. Pulmonary/Chest: No stridor. She has no wheezes. She has no rales. She exhibits no tenderness.  Abdominal: She exhibits no distension. There is no tenderness. There is no rebound.  Musculoskeletal: Normal range of motion. She exhibits no edema.  Lymphadenopathy:    She has no cervical adenopathy.  Neurological: She is oriented to person, place, and time. She exhibits normal muscle tone. Coordination normal.  Skin: No rash noted. No erythema.  Psychiatric: She has a normal mood and affect. Her behavior is normal.    ED Course  Procedures (including critical care time) Labs Review Labs Reviewed  CBC WITH DIFFERENTIAL - Abnormal; Notable for the following:    WBC 10.9 (*)    Neutro Abs 8.2 (*)    All other components within normal limits  URINALYSIS, ROUTINE W REFLEX MICROSCOPIC - Abnormal; Notable for the following:    Glucose, UA >1000 (*)    Hgb urine dipstick SMALL (*)    All other components within normal limits  GLUCOSE, CAPILLARY - Abnormal; Notable for the following:    Glucose-Capillary 529 (*)    All other components within normal limits  POCT I-STAT, CHEM 8 - Abnormal; Notable for the following:    Sodium 133 (*)    Potassium 3.1 (*)    Chloride 95 (*)    BUN 25 (*)    Creatinine, Ser 1.40 (*)    Glucose, Bld 517 (*)    Hemoglobin 16.3 (*)    HCT 48.0 (*)    All other components within normal limits  URINE MICROSCOPIC-ADD ON   Imaging Review Dg Chest Port 1 View  01/17/2013   CLINICAL DATA:  Weakness  EXAM: PORTABLE CHEST - 1 VIEW  COMPARISON:  None.  FINDINGS: The cardiac silhouette is  mildly prominent, mediastinal contours otherwise unremarkable. Both lungs are clear. The visualized skeletal structures are unremarkable.  IMPRESSION: 1. Mild cardiomegaly 2. No evidence of acute cardiopulmonary disease   Electronically Signed   By: Oswaldo Done  Excell Seltzer M.D.   On: 01/17/2013 14:21    EKG Interpretation   None       MDM   1. UTI (lower urinary tract infection)   2. Hyperglycemia        Benny Lennert, MD 01/17/13 1526
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
## 2013-01-17 NOTE — ED Notes (Signed)
She remains in no distress; and is grateful that her cbg came down.  She has spoken with her niece, who is coming to take her home.

## 2013-01-17 NOTE — ED Notes (Signed)
Bed: WA19 Expected date:  Expected time:  Means of arrival:  Comments: ems 

## 2013-07-29 ENCOUNTER — Emergency Department (HOSPITAL_COMMUNITY)
Admission: EM | Admit: 2013-07-29 | Discharge: 2013-07-29 | Disposition: A | Discharge status: Home / Self Care | Payer: Medicaid Other | Attending: Emergency Medicine | Admitting: Emergency Medicine

## 2013-07-29 ENCOUNTER — Encounter (HOSPITAL_COMMUNITY): Payer: Self-pay | Admitting: Emergency Medicine

## 2013-07-29 DIAGNOSIS — J449 Chronic obstructive pulmonary disease, unspecified: Secondary | ICD-10-CM | POA: Insufficient documentation

## 2013-07-29 DIAGNOSIS — R21 Rash and other nonspecific skin eruption: Secondary | ICD-10-CM | POA: Insufficient documentation

## 2013-07-29 DIAGNOSIS — J029 Acute pharyngitis, unspecified: Secondary | ICD-10-CM | POA: Insufficient documentation

## 2013-07-29 DIAGNOSIS — M129 Arthropathy, unspecified: Secondary | ICD-10-CM | POA: Insufficient documentation

## 2013-07-29 DIAGNOSIS — Z79899 Other long term (current) drug therapy: Secondary | ICD-10-CM | POA: Insufficient documentation

## 2013-07-29 DIAGNOSIS — X12XXXA Contact with other hot fluids, initial encounter: Secondary | ICD-10-CM | POA: Insufficient documentation

## 2013-07-29 DIAGNOSIS — F329 Major depressive disorder, single episode, unspecified: Secondary | ICD-10-CM | POA: Insufficient documentation

## 2013-07-29 DIAGNOSIS — F17200 Nicotine dependence, unspecified, uncomplicated: Secondary | ICD-10-CM | POA: Insufficient documentation

## 2013-07-29 DIAGNOSIS — E119 Type 2 diabetes mellitus without complications: Secondary | ICD-10-CM | POA: Insufficient documentation

## 2013-07-29 DIAGNOSIS — J4489 Other specified chronic obstructive pulmonary disease: Secondary | ICD-10-CM | POA: Insufficient documentation

## 2013-07-29 DIAGNOSIS — I10 Essential (primary) hypertension: Secondary | ICD-10-CM | POA: Insufficient documentation

## 2013-07-29 DIAGNOSIS — L97809 Non-pressure chronic ulcer of other part of unspecified lower leg with unspecified severity: Secondary | ICD-10-CM | POA: Insufficient documentation

## 2013-07-29 DIAGNOSIS — T22239A Burn of second degree of unspecified upper arm, initial encounter: Secondary | ICD-10-CM | POA: Insufficient documentation

## 2013-07-29 DIAGNOSIS — L97909 Non-pressure chronic ulcer of unspecified part of unspecified lower leg with unspecified severity: Secondary | ICD-10-CM

## 2013-07-29 DIAGNOSIS — X131XXA Other contact with steam and other hot vapors, initial encounter: Secondary | ICD-10-CM

## 2013-07-29 DIAGNOSIS — Y939 Activity, unspecified: Secondary | ICD-10-CM | POA: Insufficient documentation

## 2013-07-29 DIAGNOSIS — Y929 Unspecified place or not applicable: Secondary | ICD-10-CM | POA: Insufficient documentation

## 2013-07-29 DIAGNOSIS — F3289 Other specified depressive episodes: Secondary | ICD-10-CM | POA: Insufficient documentation

## 2013-07-29 MED ORDER — NYSTATIN 100000 UNIT/ML MT SUSP: 500000.0000 [IU] | Freq: Four times a day (QID) | OROMUCOSAL | Status: DC

## 2013-07-29 MED ORDER — CLINDAMYCIN HCL 150 MG PO CAPS: 450.0000 mg | ORAL_CAPSULE | Freq: Three times a day (TID) | ORAL | Status: DC

## 2013-07-29 NOTE — Discharge Instructions (Signed)
Cellulitis Cellulitis is an infection of the skin and the tissue beneath it. The infected area is usually red and tender. Cellulitis occurs most often in the arms and lower legs.  CAUSES  Cellulitis is caused by bacteria that enter the skin through cracks or cuts in the skin. The most common types of bacteria that cause cellulitis are Staphylococcus and Streptococcus. SYMPTOMS   Redness and warmth.  Swelling.  Tenderness or pain.  Fever. DIAGNOSIS  Your caregiver can usually determine what is wrong based on a physical exam. Blood tests may also be done. TREATMENT  Treatment usually involves taking an antibiotic medicine. HOME CARE INSTRUCTIONS   Take your antibiotics as directed. Finish them even if you start to feel better.  Keep the infected arm or leg elevated to reduce swelling.  Apply a warm cloth to the affected area up to 4 times per day to relieve pain.  Only take over-the-counter or prescription medicines for pain, discomfort, or fever as directed by your caregiver.  Keep all follow-up appointments as directed by your caregiver. SEEK MEDICAL CARE IF:   You notice red streaks coming from the infected area.  Your red area gets larger or turns dark in color.  Your bone or joint underneath the infected area becomes painful after the skin has healed.  Your infection returns in the same area or another area.  You notice a swollen bump in the infected area.  You develop new symptoms. SEEK IMMEDIATE MEDICAL CARE IF:   You have a fever.  You feel very sleepy.  You develop vomiting or diarrhea.  You have a general ill feeling (malaise) with muscle aches and pains. MAKE SURE YOU:   Understand these instructions.  Will watch your condition.  Will get help right away if you are not doing well or get worse. Document Released: 11/21/2004 Document Revised: 08/13/2011 Document Reviewed: 04/29/2011 [facility] Patient Information 2014 [place].  Skin Ulcer A  skin ulcer is an open sore that can be shallow or deep. Skin ulcers sometimes become infected and are difficult to treat. It may be 1 month or longer before real healing progress is made. CAUSES   Injury.  Problems with the veins or arteries.  Diabetes.  Insect bites.  Bedsores.  Inflammatory conditions. SYMPTOMS   Pain, redness, swelling, and tenderness around the ulcer.  Fever.  Bleeding from the ulcer.  Yellow or clear fluid coming from the ulcer. DIAGNOSIS  There are many types of skin ulcers. Any open sores will be examined. Certain tests will be done to determine the kind of ulcer you have. The right treatment depends on the type of ulcer you have. TREATMENT  Treatment is a long-term challenge. It may include:  Wearing an elastic wrap, compression stockings, or gel cast over the ulcer area.  Taking antibiotic medicines or putting antibiotic creams on the affected area if there is an infection. HOME CARE INSTRUCTIONS  Put on your bandages (dressings), wraps, or casts over the ulcer as directed by your caregiver.  Change all dressings as directed by your caregiver.  Take all medicines as directed by your caregiver.  Keep the affected area clean and dry.  Avoid injuries to the affected area.  Eat a well-balanced, healthy diet that includes plenty of fruit and vegetables.  If you smoke, consider quitting or decreasing the amount of cigarettes you smoke.  Once the ulcer heals, get regular exercise as directed by your caregiver.  Work with your caregiver to make sure your blood pressure,  cholesterol, and diabetes are well-controlled.  Keep your skin moisturized. Dry skin can crack and lead to skin ulcers. SEEK IMMEDIATE MEDICAL CARE IF:   Your pain gets worse.  You have swelling, redness, or fluids around the ulcer.  You have chills.  You have a fever. MAKE SURE YOU:   Understand these instructions.  Will watch your condition.  Will get help right  away if you are not doing well or get worse. Document Released: 03/21/2004 Document Revised: 05/06/2011 Document Reviewed: 09/28/2010 [facility] Patient Information 2014 [place], [place].

## 2013-07-29 NOTE — ED Notes (Signed)
Patient is alert and orientedx4.  Patient was explained discharge instructions and they understood them with no questions.   

## 2013-07-29 NOTE — ED Notes (Signed)
Dr. Docherty at bedside.
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
## 2013-08-03 ENCOUNTER — Emergency Department (HOSPITAL_COMMUNITY): Payer: Medicaid Other

## 2013-08-03 ENCOUNTER — Other Ambulatory Visit: Payer: Self-pay

## 2013-08-03 ENCOUNTER — Inpatient Hospital Stay (HOSPITAL_COMMUNITY)
Admission: EM | Admit: 2013-08-03 | Discharge: 2013-08-05 | DRG: 872 | Disposition: A | Discharge status: Home / Self Care | Payer: Medicaid Other | Attending: Internal Medicine | Admitting: Internal Medicine

## 2013-08-03 DIAGNOSIS — R7989 Other specified abnormal findings of blood chemistry: Secondary | ICD-10-CM

## 2013-08-03 DIAGNOSIS — L97909 Non-pressure chronic ulcer of unspecified part of unspecified lower leg with unspecified severity: Secondary | ICD-10-CM

## 2013-08-03 DIAGNOSIS — J449 Chronic obstructive pulmonary disease, unspecified: Secondary | ICD-10-CM | POA: Diagnosis present

## 2013-08-03 DIAGNOSIS — B370 Candidal stomatitis: Secondary | ICD-10-CM

## 2013-08-03 DIAGNOSIS — Z6841 Body Mass Index (BMI) 40.0 and over, adult: Secondary | ICD-10-CM

## 2013-08-03 DIAGNOSIS — G8929 Other chronic pain: Secondary | ICD-10-CM

## 2013-08-03 DIAGNOSIS — E785 Hyperlipidemia, unspecified: Secondary | ICD-10-CM | POA: Diagnosis present

## 2013-08-03 DIAGNOSIS — E119 Type 2 diabetes mellitus without complications: Secondary | ICD-10-CM | POA: Diagnosis present

## 2013-08-03 DIAGNOSIS — Z79899 Other long term (current) drug therapy: Secondary | ICD-10-CM

## 2013-08-03 DIAGNOSIS — IMO0002 Reserved for concepts with insufficient information to code with codable children: Secondary | ICD-10-CM | POA: Diagnosis present

## 2013-08-03 DIAGNOSIS — L97209 Non-pressure chronic ulcer of unspecified calf with unspecified severity: Secondary | ICD-10-CM | POA: Diagnosis present

## 2013-08-03 DIAGNOSIS — B1920 Unspecified viral hepatitis C without hepatic coma: Secondary | ICD-10-CM | POA: Diagnosis present

## 2013-08-03 DIAGNOSIS — L909 Atrophic disorder of skin, unspecified: Secondary | ICD-10-CM | POA: Diagnosis present

## 2013-08-03 DIAGNOSIS — J4489 Other specified chronic obstructive pulmonary disease: Secondary | ICD-10-CM | POA: Diagnosis present

## 2013-08-03 DIAGNOSIS — I739 Peripheral vascular disease, unspecified: Secondary | ICD-10-CM | POA: Diagnosis present

## 2013-08-03 DIAGNOSIS — E872 Acidosis, unspecified: Secondary | ICD-10-CM | POA: Diagnosis present

## 2013-08-03 DIAGNOSIS — L0390 Cellulitis, unspecified: Secondary | ICD-10-CM

## 2013-08-03 DIAGNOSIS — G894 Chronic pain syndrome: Secondary | ICD-10-CM

## 2013-08-03 DIAGNOSIS — I10 Essential (primary) hypertension: Secondary | ICD-10-CM | POA: Diagnosis present

## 2013-08-03 DIAGNOSIS — M1990 Unspecified osteoarthritis, unspecified site: Secondary | ICD-10-CM | POA: Diagnosis present

## 2013-08-03 DIAGNOSIS — G4733 Obstructive sleep apnea (adult) (pediatric): Secondary | ICD-10-CM | POA: Diagnosis present

## 2013-08-03 DIAGNOSIS — B851 Pediculosis due to Pediculus humanus corporis: Secondary | ICD-10-CM

## 2013-08-03 DIAGNOSIS — L919 Hypertrophic disorder of the skin, unspecified: Secondary | ICD-10-CM

## 2013-08-03 DIAGNOSIS — B850 Pediculosis due to Pediculus humanus capitis: Secondary | ICD-10-CM | POA: Diagnosis present

## 2013-08-03 DIAGNOSIS — R509 Fever, unspecified: Secondary | ICD-10-CM

## 2013-08-03 DIAGNOSIS — F17200 Nicotine dependence, unspecified, uncomplicated: Secondary | ICD-10-CM | POA: Diagnosis present

## 2013-08-03 DIAGNOSIS — R739 Hyperglycemia, unspecified: Secondary | ICD-10-CM

## 2013-08-03 DIAGNOSIS — F3289 Other specified depressive episodes: Secondary | ICD-10-CM | POA: Diagnosis present

## 2013-08-03 DIAGNOSIS — A419 Sepsis, unspecified organism: Secondary | ICD-10-CM | POA: Diagnosis present

## 2013-08-03 DIAGNOSIS — I872 Venous insufficiency (chronic) (peripheral): Secondary | ICD-10-CM | POA: Diagnosis present

## 2013-08-03 DIAGNOSIS — R300 Dysuria: Secondary | ICD-10-CM

## 2013-08-03 DIAGNOSIS — B852 Pediculosis, unspecified: Secondary | ICD-10-CM | POA: Diagnosis present

## 2013-08-03 DIAGNOSIS — F329 Major depressive disorder, single episode, unspecified: Secondary | ICD-10-CM | POA: Diagnosis present

## 2013-08-03 DIAGNOSIS — D72829 Elevated white blood cell count, unspecified: Secondary | ICD-10-CM | POA: Diagnosis present

## 2013-08-03 LAB — CBC WITH DIFFERENTIAL/PLATELET
Basophils Absolute: 0 10*3/uL (ref 0.0–0.1)
Basophils Relative: 0 % (ref 0–1)
Eosinophils Absolute: 0.1 10*3/uL (ref 0.0–0.7)
Eosinophils Relative: 1 % (ref 0–5)
HCT: 44.4 % (ref 36.0–46.0)
HEMOGLOBIN: 15.3 g/dL — AB (ref 12.0–15.0)
LYMPHS ABS: 0.7 10*3/uL (ref 0.7–4.0)
Lymphocytes Relative: 4 % — ABNORMAL LOW (ref 12–46)
MCH: 29.5 pg (ref 26.0–34.0)
MCHC: 34.5 g/dL (ref 30.0–36.0)
MCV: 85.5 fL (ref 78.0–100.0)
MONOS PCT: 3 % (ref 3–12)
Monocytes Absolute: 0.6 10*3/uL (ref 0.1–1.0)
NEUTROS ABS: 16.5 10*3/uL — AB (ref 1.7–7.7)
NEUTROS PCT: 92 % — AB (ref 43–77)
Platelets: 207 10*3/uL (ref 150–400)
RBC: 5.19 MIL/uL — ABNORMAL HIGH (ref 3.87–5.11)
RDW: 12.8 % (ref 11.5–15.5)
WBC: 17.9 10*3/uL — ABNORMAL HIGH (ref 4.0–10.5)

## 2013-08-03 LAB — URINALYSIS, ROUTINE W REFLEX MICROSCOPIC
BILIRUBIN URINE: NEGATIVE
KETONES UR: NEGATIVE mg/dL
LEUKOCYTES UA: NEGATIVE
Nitrite: NEGATIVE
PROTEIN: 30 mg/dL — AB
Specific Gravity, Urine: 1.02 (ref 1.005–1.030)
Urobilinogen, UA: 0.2 mg/dL (ref 0.0–1.0)
pH: 7 (ref 5.0–8.0)

## 2013-08-03 LAB — I-STAT TROPONIN, ED: Troponin i, poc: 0 ng/mL (ref 0.00–0.08)

## 2013-08-03 LAB — COMPREHENSIVE METABOLIC PANEL
ALT: 18 U/L (ref 0–35)
AST: 22 U/L (ref 0–37)
Albumin: 3.7 g/dL (ref 3.5–5.2)
Alkaline Phosphatase: 113 U/L (ref 39–117)
BILIRUBIN TOTAL: 0.5 mg/dL (ref 0.3–1.2)
BUN: 15 mg/dL (ref 6–23)
CHLORIDE: 91 meq/L — AB (ref 96–112)
CO2: 26 mEq/L (ref 19–32)
Calcium: 9.1 mg/dL (ref 8.4–10.5)
Creatinine, Ser: 0.9 mg/dL (ref 0.50–1.10)
GFR calc Af Amer: 82 mL/min — ABNORMAL LOW (ref >90)
GFR calc non Af Amer: 71 mL/min — ABNORMAL LOW (ref >90)
GLUCOSE: 336 mg/dL — AB (ref 70–99)
Potassium: 4.3 mEq/L (ref 3.7–5.3)
SODIUM: 134 meq/L — AB (ref 137–147)
Total Protein: 7.8 g/dL (ref 6.0–8.3)

## 2013-08-03 LAB — URINE MICROSCOPIC-ADD ON

## 2013-08-03 LAB — I-STAT CG4 LACTIC ACID, ED: Lactic Acid, Venous: 2.4 mmol/L — ABNORMAL HIGH (ref 0.5–2.2)

## 2013-08-03 LAB — MRSA PCR SCREENING: MRSA BY PCR: NEGATIVE

## 2013-08-03 MED ORDER — PROMETHAZINE HCL 25 MG PO TABS: 12.5000 mg | ORAL_TABLET | Freq: Four times a day (QID) | ORAL | Status: DC | PRN

## 2013-08-03 MED ORDER — AMLODIPINE BESYLATE 5 MG PO TABS
5.0000 mg | ORAL_TABLET | Freq: Every day | ORAL | Status: DC
  Administered 2013-08-04 (×2): 5 mg via ORAL
  Filled 2013-08-03 (×4): qty 1

## 2013-08-03 MED ORDER — INSULIN ASPART 100 UNIT/ML ~~LOC~~ SOLN
0.0000 [IU] | Freq: Every day | SUBCUTANEOUS | Status: DC
Start: 2013-08-03 — End: 2013-08-05
  Administered 2013-08-04: 2 [IU] via SUBCUTANEOUS

## 2013-08-03 MED ORDER — METHADONE HCL 10 MG PO TABS
40.0000 mg | ORAL_TABLET | Freq: Three times a day (TID) | ORAL | Status: DC
  Administered 2013-08-04 – 2013-08-05 (×5): 40 mg via ORAL
  Filled 2013-08-03 (×5): qty 4

## 2013-08-03 MED ORDER — SODIUM CHLORIDE 0.9 % IV SOLN
INTRAVENOUS | Status: AC
  Administered 2013-08-03: 75 mL/h via INTRAVENOUS

## 2013-08-03 MED ORDER — PERMETHRIN 1 % EX LOTN
TOPICAL_LOTION | Freq: Once | CUTANEOUS | Status: AC
  Administered 2013-08-03: 22:00:00 via TOPICAL
  Filled 2013-08-03: qty 59

## 2013-08-03 MED ORDER — SODIUM CHLORIDE 0.9 % IJ SOLN
3.0000 mL | Freq: Two times a day (BID) | INTRAMUSCULAR | Status: DC
  Administered 2013-08-05: 10:00:00 via INTRAVENOUS

## 2013-08-03 MED ORDER — NYSTATIN 100000 UNIT/ML MT SUSP
500000.0000 [IU] | Freq: Four times a day (QID) | OROMUCOSAL | Status: DC
  Administered 2013-08-03 – 2013-08-05 (×7): 500000 [IU] via ORAL
  Filled 2013-08-03 (×11): qty 5

## 2013-08-03 MED ORDER — ENOXAPARIN SODIUM 60 MG/0.6ML ~~LOC~~ SOLN
60.0000 mg | SUBCUTANEOUS | Status: DC
  Administered 2013-08-03 – 2013-08-04 (×2): 60 mg via SUBCUTANEOUS
  Filled 2013-08-03 (×3): qty 0.6

## 2013-08-03 MED ORDER — METHADONE HCL 10 MG PO TABS
30.0000 mg | ORAL_TABLET | Freq: Once | ORAL | Status: AC
  Administered 2013-08-03: 30 mg via ORAL
  Filled 2013-08-03: qty 3

## 2013-08-03 MED ORDER — VANCOMYCIN HCL 10 G IV SOLR
1250.0000 mg | Freq: Two times a day (BID) | INTRAVENOUS | Status: DC
  Administered 2013-08-04 – 2013-08-05 (×3): 1250 mg via INTRAVENOUS
  Filled 2013-08-03 (×5): qty 1250

## 2013-08-03 MED ORDER — PIPERACILLIN-TAZOBACTAM 3.375 G IVPB
3.3750 g | Freq: Three times a day (TID) | INTRAVENOUS | Status: DC
  Administered 2013-08-03 – 2013-08-05 (×6): 3.375 g via INTRAVENOUS
  Filled 2013-08-03 (×8): qty 50

## 2013-08-03 MED ORDER — VANCOMYCIN HCL 10 G IV SOLR
2500.0000 mg | Freq: Once | INTRAVENOUS | Status: AC
  Administered 2013-08-03: 2500 mg via INTRAVENOUS
  Filled 2013-08-03: qty 2500

## 2013-08-03 MED ORDER — SODIUM CHLORIDE 0.9 % IV SOLN
Freq: Once | INTRAVENOUS | Status: AC
  Administered 2013-08-03: 15:00:00 via INTRAVENOUS

## 2013-08-03 MED ORDER — MOMETASONE FURO-FORMOTEROL FUM 100-5 MCG/ACT IN AERO
2.0000 | INHALATION_SPRAY | Freq: Two times a day (BID) | RESPIRATORY_TRACT | Status: DC
  Administered 2013-08-03 – 2013-08-05 (×3): 2 via RESPIRATORY_TRACT
  Filled 2013-08-03: qty 8.8

## 2013-08-03 MED ORDER — INSULIN ASPART 100 UNIT/ML ~~LOC~~ SOLN
0.0000 [IU] | Freq: Three times a day (TID) | SUBCUTANEOUS | Status: DC
Start: 2013-08-04 — End: 2013-08-04

## 2013-08-03 MED ORDER — PREGABALIN 50 MG PO CAPS
50.0000 mg | ORAL_CAPSULE | Freq: Three times a day (TID) | ORAL | Status: DC
  Administered 2013-08-03 – 2013-08-05 (×6): 50 mg via ORAL
  Filled 2013-08-03 (×6): qty 1

## 2013-08-03 MED ORDER — METHADONE HCL 10 MG PO TABS
10.0000 mg | ORAL_TABLET | Freq: Three times a day (TID) | ORAL | Status: DC
  Administered 2013-08-03: 10 mg via ORAL
  Filled 2013-08-03: qty 1

## 2013-08-03 MED ORDER — ALBUTEROL SULFATE (2.5 MG/3ML) 0.083% IN NEBU: 2.5000 mg | INHALATION_SOLUTION | RESPIRATORY_TRACT | Status: DC | PRN

## 2013-08-03 NOTE — ED Notes (Signed)
Pt to ED via [facility] c/o fever x 1 day. Pt currently being treated with Clindamycin for Cellulitis to L arm x 3 days. Has hx of SIRS admission last year.  Reports fever of 101 at home this morning, increased shortness of breath. CBG 372, bp 210/110 on EMS arrival. Pt reports not taking medications for diabetes or htn

## 2013-08-03 NOTE — ED Notes (Addendum)
Pt c/o fever x 2 days. Recently prescribed clindamycin x 1 week ago for cellulitis to L arm; noncompliant with antibiotics. Sts antibiotics "make me feel sick to my stomach so I didn't take them at first, but I have been taking them for the past 3 days." L arm is red, swollen, and warm to the touch. Pt reports area is much more red and painful than before.  Also c/o shortness of breath on exertion x 1 week. Lung sounds clear and diminished. Pt reports noncompliance with diabetic and htn medications. Sts "my diabetic medication makes me pee a lot and my blood pressure medication makes me nauseated."

## 2013-08-03 NOTE — ED Notes (Signed)
Attempted report 

## 2013-08-03 NOTE — Progress Notes (Signed)
ANTIBIOTIC CONSULT NOTE - INITIAL  Pharmacy Consult for vancomycin/zosyn Indication: rule out sepsis  Allergies  Allergen Reactions  . Ace Inhibitors Anaphylaxis    Patient Measurements: Height: 5\' 6"  (167.6 cm) Weight: 280 lb (127.007 kg) IBW/kg (Calculated) : 59.3  Vital Signs: Temp: 102.3 F (39.1 C) (06/09 1352) Temp src: Oral (06/09 1352) BP: 179/88 mmHg (06/09 1352) Pulse Rate: 114 (06/09 1352) Intake/Output from previous day:   Intake/Output from this shift:    Labs:  Recent Labs  08/03/13 1415  WBC 17.9*  HGB 15.3*  PLT 207  CREATININE 0.90   Estimated Creatinine Clearance: 96.3 ml/min (by C-G formula based on Cr of 0.9). No results found for this basename: VANCOTROUGH, VANCOPEAK, VANCORANDOM, GENTTROUGH, GENTPEAK, GENTRANDOM, TOBRATROUGH, TOBRAPEAK, TOBRARND, AMIKACINPEAK, AMIKACINTROU, AMIKACIN,  in the last 72 hours   Microbiology: No results found for this or any previous visit (from the past 720 hour(s)).  Medical History: Past Medical History  Diagnosis Date  . Hypertension   . Diabetes mellitus without complication   . COPD (chronic obstructive pulmonary disease)   . Shortness of breath   . Depression   . Arthritis     Assessment: 58 yo F with a hx of HTN, DM, COPD who presents with fever and cellulitis of LUE, having been noncompliant with an outpatient course of clindamycin. Pharmacy is consulted to start vancomycin and zosyn for r/o sepsis. Temp = 102.3, wbc elevated at 17.9. Scr 0.9, est crcl ~ 90-95 ml/min. Urine and blood cultures are pending   Goal of Therapy:  Vancomycin trough level 15-20 mcg/ml  Plan:  - Vancomycin 2500 mg loading dose, then 1250 mg IV Q 12 hrs - Zsyn 3.375g IV Q 8 hrs (4hr infusion) - f/u renal function and cultures  - Check vancomycin trough at steady state.  Maryanna Shape, PharmD, BCPS  Clinical Pharmacist  Pager: [phone]  08/03/2013,2:58 PM

## 2013-08-03 NOTE — H&P (Signed)
Date: 08/03/2013               Patient Name:  [name] MRN: [id number]  DOB: 01/02/1956 Age / Sex: 58 y.o., female   PCP: No Pcp Per Patient         Medical Service: Internal Medicine Teaching Service         Attending Physician: Dr. Bartholomew Crews, MD;*    First Contact: Dr. Denton Brick Pager: [phone]  Second Contact: Dr. Hayes Ludwig Pager: [phone]       After Hours (After 5p/  First Contact Pager: [phone]  weekends / holidays): Second Contact Pager: [phone]   Chief Complaint: Fever.   History of Present Illness: 81 y o F with PMH of Hep C, HLD, Morbid obesity, OSA, PVD, DM2, HTN, COPD, SIRs, tobacco abuse, OA and Back pain- On methadone. Presented today with c/o fever- 2 days duration. Pt previously was seen in the Ed on the 4th of this month when she came in with complaints of left arm swelling, started 2 days prior to presentation in the Ed on the 4th, pt says some grease dropped on her arm. Pain has increased in severity since then. Pt was given Prescription for Clindamycin and discharged home. She says she took th clindamycin for 4 days, this morning was the last time she took the medication. She was told to come back to tyeh ED if she spiked a fever.  No diarrhea or Vomiting, but endorses poor Po intake. No hx of trauma.  Meds: Current Facility-Administered Medications  Medication Dose Route Frequency Provider Last Rate Last Dose  . piperacillin-tazobactam (ZOSYN) IVPB 3.375 g  3.375 g Intravenous 3 times per day Manley Mason, RPH 12.5 mL/hr at 08/03/13 1519 3.375 g at 08/03/13 1519  . [START ON 08/04/2013] vancomycin (VANCOCIN) 1,250 mg in sodium chloride 0.9 % 250 mL IVPB  1,250 mg Intravenous Q12H Manley Mason, RPH      . vancomycin (VANCOCIN) 2,500 mg in sodium chloride 0.9 % 500 mL IVPB  2,500 mg Intravenous Once Manley Mason, RPH 250 mL/hr at 08/03/13 1541 2,500 mg at 08/03/13 1541   Current Outpatient Prescriptions  Medication Sig Dispense Refill  . albuterol  (PROVENTIL HFA;VENTOLIN HFA) 108 (90 BASE) MCG/ACT inhaler Inhale 2 puffs into the lungs every 4 (four) hours as needed for wheezing or shortness of breath.  18 g  1  . clindamycin (CLEOCIN) 150 MG capsule Take 3 capsules (450 mg total) by mouth 3 (three) times daily.  90 capsule  0  . cyclobenzaprine (FLEXERIL) 10 MG tablet Take 10 mg by mouth at bedtime as needed for muscle spasms.      Marland Kitchen ibuprofen (ADVIL,MOTRIN) 200 MG tablet Take 400-600 mg by mouth every 6 (six) hours as needed for fever, headache or moderate pain.       . methadone (DOLOPHINE) 10 MG tablet Take 40 mg by mouth every 8 (eight) hours.       Marland Kitchen nystatin (MYCOSTATIN) 100000 UNIT/ML suspension Take 5 mLs (500,000 Units total) by mouth 4 (four) times daily.  60 mL  0  . pregabalin (LYRICA) 50 MG capsule Take 50 mg by mouth every 8 (eight) hours. 0600, 1400, 2200        Allergies: Allergies as of 08/03/2013 - Review Complete 08/03/2013  Allergen Reaction Noted  . Ace inhibitors Anaphylaxis    Past Medical History  Diagnosis Date  . Hypertension   . Diabetes mellitus without complication   . COPD (  chronic obstructive pulmonary disease)   . Shortness of breath   . Depression   . Arthritis    Past Surgical History  Procedure Laterality Date  . Cholecystectomy    . Tubal ligation    . Carpel tunnel    . Breast surgery      LUMPECTOMY    Family History  Problem Relation Age of Onset  . Adopted: Yes   History   Social History  . Marital Status: Married    Spouse Name: N/A    Number of Children: N/A  . Years of Education: N/A   Occupational History  . Not on file.   Social History Main Topics  . Smoking status: Current Every Day Smoker -- 1.00 packs/day for 40 years    Types: Cigarettes  . Smokeless tobacco: Never Used  . Alcohol Use: No  . Drug Use: No  . Sexual Activity: Not on file   Other Topics Concern  . Not on file   Social History Narrative  . No narrative on file    Review of  Systems: CONSTITUTIONAL- No weightloss. SKIN- Redness- Left arm- ~1 week, also lower extremities- 3 months., also complaints of itching, has Lice, her daughter got it from school.  HEAD- No Headache or dizziness. EYES- No Vision loss, pain, redness, double or blurred vision. EARS- No vertigo, hearing loss or ear discharge. RESPIRATORY- Cough present, non productive,  Some SOB with exertion. CARDIAC- No Palpitations, DOE, PND or chest pain. GI- No nausea, vomiting, diarrhoea, constipation, or abd pain. URINARY- Complaints of some pain on urination, urgency, straining or dysuria. NEUROLOGIC- No Numbness, syncope, seizures or burning. Has chronic back pain, on pain meds- Chronic opioids- Methadone. [facility]- Denies depression or anxiety. EXTREM- Knee pain- Chronic, Methadone prescription by Pain clinic.  Physical Exam: Blood pressure 180/78, pulse 113, temperature 100.8 F (38.2 C), temperature source Oral, resp. rate 22, height 5\' 6"  (1.676 m), weight 280 lb (127.007 kg), SpO2 96.00%.  GENERAL- alert, co-operative, appears as stated age, not in any distress, sitting in bed comfortably. HEENT- Atraumatic, normocephalic, EOMI, oral mucosa appears very dry, some minimal whitish lesions on tongue, neck supple. CARDIAC- RRR, no murmurs, rubs or gallops. RESP- Moving equal volumes of air, has mild bilat inspiratory wheezes- Lower lung zones bilat, No crackles. ABDOMEN- Soft, obese, nontender, no guarding or rebound, no palpable masses or organomegaly, bowel sounds present.Marland Kitchen NEURO- No obvious Cr N abnormality, strenght upper and lower extremities- 5/5.  EXTREMITIES- LUE- Significantly bigger than RUE, redness, mostly in the medial aspect, with crusting, warm, extending from the mid arm to the mid forearm,mildly tender, not fluctuant. RLE- hyperpigmentation, with dry skin, from ankles to mid leg, LLE- hypigmentation with chronic ulcer- medial aspect, ~3 by 3cm, appears to be healing, pedal edema without  pitting.  SKIN- Warm, dry, No rash or lesion. PSYCH- Normal mood and affect, appropriate thought content and speech.  Lab results: Basic Metabolic Panel:  Recent Labs  08/03/13 1415  NA 134*  K 4.3  CL 91*  CO2 26  GLUCOSE 336*  BUN 15  CREATININE 0.90  CALCIUM 9.1   Liver Function Tests:  Recent Labs  08/03/13 1415  AST 22  ALT 18  ALKPHOS 113  BILITOT 0.5  PROT 7.8  ALBUMIN 3.7   CBC:  Recent Labs  08/03/13 1415  WBC 17.9*  NEUTROABS 16.5*  HGB 15.3*  HCT 44.4  MCV 85.5  PLT 207   Cardiac Enzymes: No results found for this basename: CKTOTAL, CKMB,  CKMBINDEX, TROPONINI,  in the last 72 hours BNP: No results found for this basename: PROBNP,  in the last 72 hours D-Dimer: No results found for this basename: DDIMER,  in the last 72 hours CBG: No results found for this basename: GLUCAP,  in the last 72 hours Hemoglobin A1C: No results found for this basename: HGBA1C,  in the last 72 hours Fasting Lipid Panel: No results found for this basename: CHOL, HDL, LDLCALC, TRIG, CHOLHDL, LDLDIRECT,  in the last 72 hours Thyroid Function Tests: No results found for this basename: TSH, T4TOTAL, FREET4, T3FREE, THYROIDAB,  in the last 72 hours Anemia Panel: No results found for this basename: VITAMINB12, FOLATE, FERRITIN, TIBC, IRON, RETICCTPCT,  in the last 72 hours Coagulation: No results found for this basename: LABPROT, INR,  in the last 72 hours Urine Drug Screen: Drugs of Abuse     Component Value Date/Time   LABOPIA NONE DETECTED 08/05/2012 1142   COCAINSCRNUR NONE DETECTED 08/05/2012 1142   LABBENZ NONE DETECTED 08/05/2012 1142   AMPHETMU NONE DETECTED 08/05/2012 1142   THCU NONE DETECTED 08/05/2012 1142   LABBARB NONE DETECTED 08/05/2012 1142    Alcohol Level: No results found for this basename: ETH,  in the last 72 hours Urinalysis:  Recent Labs  08/03/13 1446  COLORURINE YELLOW  LABSPEC 1.020  PHURINE 7.0  GLUCOSEU >1000*  HGBUR TRACE*   BILIRUBINUR NEGATIVE  KETONESUR NEGATIVE  PROTEINUR 30*  UROBILINOGEN 0.2  NITRITE NEGATIVE  LEUKOCYTESUR NEGATIVE   Misc. Labs:   Imaging results:  Dg Chest Port 1 View  (if Code Sepsis Called)  08/03/2013   CLINICAL DATA:  Shortness of breath and fever  EXAM: PORTABLE CHEST - 1 VIEW  COMPARISON:  01/17/2013  FINDINGS: Cardiac shadow is stable. The lungs are well aerated bilaterally without focal infiltrate or sizable effusion. No acute bony abnormality is noted.  IMPRESSION: No acute abnormality noted.   Electronically Signed   By: Inez Catalina M.D.   On: 08/03/2013 14:24    Other results: EKG: Rate- 116bpm, sinus, normal PR interval ,and QRS duration, Q waves- V1 and v2, no ST or T wave abnormality, QTC- 481, Axis-left axis deviation.   Assessment & Plan by Problem:  Sepsis- Most likely source- Left upper arm, With redness, fever- high- 102.8, charted on admission, in the Ed, also with tachycardia- up to 116, tachypnea- up to 26, Leukocytosis- 17.9. Most likley organisms determining antibiotic choice- Strep and Staph. Lactic acid elevated- likely from sepsis. Also patient has a anion gap- Likely from Lactic acidosis.  - Admit to Tele. - Blood cultures pending - IV vanc and Zosyn started in the Ed - Bmet in the Am - CBC in the Am   Diabetes 2- Blood sugars elevated, CBGs- 336. Increased Anion gap, but no suspicion of DKA- with negative urine Ketones, and normal Serum bicarb.  - SSI-  - HgbA1c - Diet Carb mod  Chronic leg ulcer- Likely from stasis dermatitis. - Wound care consult.  Dysuria- UA and microscopy presently not suggestive. On IV antibiotic- Vanc and Zosyn. - Urine cultures pending.  COPD- Some wheezing present on exam. Albuterol inhaler. Home meds- Dulera,  - Cont Dulera - Albuterol nebs- Q2H PRN  Oral thrush- Pt says despite rinsing her mouth after using inhaler she still developed a thrush. - Nystatin suspension  Chronic Pain- Will continue Pain meds on  admission. [facility] clinic, prescribes pts pain meds. Also on Lyrica. - Continue methadone on admission.  Lice infestation- treat  with Permitrin 15 solution.   Dispo: Disposition is deferred at this time, awaiting improvement of current medical problems.  The patient does have a current PCP (No Pcp Per Patient) and does need an [facility] hospital follow-up appointment after discharge.  The patient does not have transportation limitations that hinder transportation to clinic appointments.  Signed: Jenetta Downer, MD 08/03/2013, 4:04 PM

## 2013-08-03 NOTE — ED Provider Notes (Signed)
CSN: [id number]     Arrival date & time 08/03/13  1343 History   First MD Initiated Contact with Patient 08/03/13 1408     Chief Complaint  Patient presents with  . Fever     (Consider location/radiation/quality/duration/timing/severity/associated sxs/prior Treatment) HPI  Pt is a 58 yo F with a hx of HTN, DM, COPD who presents with fever and cellulitis of LUE, having been noncompliant with an outpatient course of clindamycin. Pt was seen in the ER on 07/29/2013 and started on clindamycin, which she has not taken consistently because it has upset her stomach. She has been nauseated but not vomited. Had fever at home. Has not had diarrhea but thinks she might. She has been noncompliant with her diabetes and HTN medications at home due to side effects. She has an ulcer of her LLE which she states is chronic. She has noticed more redness of her left shin though, more than normal. She also endorses shortness of breath with exertion x 1 week. Had a single episode of chest pain earlier today, which resolved immediately after coughing. She has had increased sputum production over the last week.  Past Medical History  Diagnosis Date  . Hypertension   . Diabetes mellitus without complication   . COPD (chronic obstructive pulmonary disease)   . Shortness of breath   . Depression   . Arthritis    Past Surgical History  Procedure Laterality Date  . Cholecystectomy    . Tubal ligation    . Carpel tunnel    . Breast surgery      LUMPECTOMY    Family History  Problem Relation Age of Onset  . Adopted: Yes   History  Substance Use Topics  . Smoking status: Current Every Day Smoker -- 1.00 packs/day for 40 years    Types: Cigarettes  . Smokeless tobacco: Never Used  . Alcohol Use: No   OB History   Grav Para Term Preterm Abortions TAB SAB Ect Mult Living                 Review of Systems  Constitutional: Positive for appetite change and fatigue.  Respiratory: Positive for cough, chest  tightness and shortness of breath.   Cardiovascular: Positive for chest pain.  Gastrointestinal: Positive for nausea. Negative for vomiting and diarrhea.  Skin: Positive for color change, rash and wound.    Allergies  Ace inhibitors  Home Medications   Prior to Admission medications   Medication Sig Start Date End Date Taking? Authorizing Provider  albuterol (PROVENTIL HFA;VENTOLIN HFA) 108 (90 BASE) MCG/ACT inhaler Inhale 2 puffs into the lungs every 4 (four) hours as needed for wheezing or shortness of breath. 08/10/12  Yes Hosie Poisson, MD  clindamycin (CLEOCIN) 150 MG capsule Take 3 capsules (450 mg total) by mouth 3 (three) times daily. 07/29/13  Yes Neta Ehlers, MD  cyclobenzaprine (FLEXERIL) 10 MG tablet Take 10 mg by mouth at bedtime as needed for muscle spasms.   Yes Historical Provider, MD  ibuprofen (ADVIL,MOTRIN) 200 MG tablet Take 400-600 mg by mouth every 6 (six) hours as needed for fever, headache or moderate pain.    Yes Historical Provider, MD  methadone (DOLOPHINE) 10 MG tablet Take 40 mg by mouth every 8 (eight) hours.    Yes Historical Provider, MD  nystatin (MYCOSTATIN) 100000 UNIT/ML suspension Take 5 mLs (500,000 Units total) by mouth 4 (four) times daily. 07/29/13  Yes Neta Ehlers, MD  pregabalin (LYRICA) 50 MG capsule Take 50  mg by mouth every 8 (eight) hours. 0600, 1400, 2200   Yes Historical Provider, MD   BP 179/88  Pulse 114  Temp(Src) 102.3 F (39.1 C) (Oral)  Resp 26  Ht 5\' 6"  (1.676 m)  Wt 280 lb (127.007 kg)  BMI 45.21 kg/m2  SpO2 98% Physical Exam  Constitutional: She appears well-developed and well-nourished.  HENT:  Head: Normocephalic and atraumatic.  Dry mucous membranes  Eyes: Pupils are equal, round, and reactive to light.  Cardiovascular: Intact distal pulses.   Tachycardic regular rhythm  Pulmonary/Chest:  Mildly increased WOB/resp rate but no distress. Diminished air movement throughout with faint crackles at bilateral bases.   Abdominal: Soft. She exhibits no distension and no mass. There is no tenderness. There is no rebound and no guarding.  Skin: She is not diaphoretic.  LUE with diffuse erythematous rash overlying flexor surface of forearm and proximal arm. LLE with quarter sized ulcer with surrounding erythema but no purulent drainage.  Psychiatric: She has a normal mood and affect. Her behavior is normal.    ED Course  Procedures (including critical care time) Labs Review Labs Reviewed  CBC WITH DIFFERENTIAL - Abnormal; Notable for the following:    WBC 17.9 (*)    RBC 5.19 (*)    Hemoglobin 15.3 (*)    Neutrophils Relative % 92 (*)    Neutro Abs 16.5 (*)    Lymphocytes Relative 4 (*)    All other components within normal limits  COMPREHENSIVE METABOLIC PANEL - Abnormal; Notable for the following:    Sodium 134 (*)    Chloride 91 (*)    Glucose, Bld 336 (*)    GFR calc non Af Amer 71 (*)    GFR calc Af Amer 82 (*)    All other components within normal limits  URINALYSIS, ROUTINE W REFLEX MICROSCOPIC - Abnormal; Notable for the following:    APPearance CLOUDY (*)    Glucose, UA >1000 (*)    Hgb urine dipstick TRACE (*)    Protein, ur 30 (*)    All other components within normal limits  URINE MICROSCOPIC-ADD ON - Abnormal; Notable for the following:    Squamous Epithelial / LPF FEW (*)    All other components within normal limits  I-STAT CG4 LACTIC ACID, ED - Abnormal; Notable for the following:    Lactic Acid, Venous 2.40 (*)    All other components within normal limits  URINE CULTURE  CULTURE, BLOOD (ROUTINE X 2)  CULTURE, BLOOD (ROUTINE X 2)  I-STAT TROPOININ, ED    Imaging Review Dg Chest Port 1 View  (if Code Sepsis Called)  08/03/2013   CLINICAL DATA:  Shortness of breath and fever  EXAM: PORTABLE CHEST - 1 VIEW  COMPARISON:  01/17/2013  FINDINGS: Cardiac shadow is stable. The lungs are well aerated bilaterally without focal infiltrate or sizable effusion. No acute bony abnormality  is noted.  IMPRESSION: No acute abnormality noted.   Electronically Signed   By: Inez Catalina M.D.   On: 08/03/2013 14:24     EKG Interpretation None      MDM   Final diagnoses:  Cellulitis  Leucocytosis  Fever  Elevated lactic acid level  Hyperglycemia  Diabetes    58 yo F with worsening cellulitis after being nonadherent to outpatient therapy with by mouth clindamycin. She meets sepsis criteria with leukocytosis, fever, and tachycardia along with a source. She'll require admission for IV antibiotics. Blood cultures have been drawn in the emergency room, and  patient will be started on vancomycin and Zosyn for broad-spectrum coverage. For reported shortness of breath, may represent COPD. Will check EKG and troponin. Patient will be admitted to internal medicine teaching service.  Chrisandra Netters, MD Family Medicine PGY-2    Leeanne Rio, MD 08/03/13 [phone]

## 2013-08-03 NOTE — ED Notes (Signed)
Lactic acid results given to the RN, Ria Comment

## 2013-08-03 NOTE — ED Notes (Signed)
L arm cellulits marked

## 2013-08-04 ENCOUNTER — Encounter (HOSPITAL_COMMUNITY): Payer: Self-pay | Admitting: *Deleted

## 2013-08-04 DIAGNOSIS — L0390 Cellulitis, unspecified: Secondary | ICD-10-CM

## 2013-08-04 DIAGNOSIS — L0291 Cutaneous abscess, unspecified: Secondary | ICD-10-CM

## 2013-08-04 LAB — BASIC METABOLIC PANEL
BUN: 15 mg/dL (ref 6–23)
CALCIUM: 8.4 mg/dL (ref 8.4–10.5)
CO2: 25 mEq/L (ref 19–32)
CREATININE: 0.94 mg/dL (ref 0.50–1.10)
Chloride: 99 mEq/L (ref 96–112)
GFR calc non Af Amer: 67 mL/min — ABNORMAL LOW (ref >90)
GFR, EST AFRICAN AMERICAN: 78 mL/min — AB (ref >90)
Glucose, Bld: 168 mg/dL — ABNORMAL HIGH (ref 70–99)
Potassium: 3.6 mEq/L — ABNORMAL LOW (ref 3.7–5.3)
SODIUM: 137 meq/L (ref 137–147)

## 2013-08-04 LAB — GLUCOSE, CAPILLARY
GLUCOSE-CAPILLARY: 242 mg/dL — AB (ref 70–99)
GLUCOSE-CAPILLARY: 224 mg/dL — AB (ref 70–99)
GLUCOSE-CAPILLARY: 263 mg/dL — AB (ref 70–99)
GLUCOSE-CAPILLARY: 218 mg/dL — AB (ref 70–99)
Glucose-Capillary: 187 mg/dL — ABNORMAL HIGH (ref 70–99)

## 2013-08-04 LAB — CBC
HCT: 41.7 % (ref 36.0–46.0)
Hemoglobin: 14.4 g/dL (ref 12.0–15.0)
MCH: 29.9 pg (ref 26.0–34.0)
MCHC: 34.5 g/dL (ref 30.0–36.0)
MCV: 86.5 fL (ref 78.0–100.0)
PLATELETS: 179 10*3/uL (ref 150–400)
RBC: 4.82 MIL/uL (ref 3.87–5.11)
RDW: 13 % (ref 11.5–15.5)
WBC: 9.9 10*3/uL (ref 4.0–10.5)

## 2013-08-04 LAB — HEMOGLOBIN A1C
HEMOGLOBIN A1C: 8.7 % — AB (ref <5.7)
Mean Plasma Glucose: 203 mg/dL — ABNORMAL HIGH (ref <117)

## 2013-08-04 MED ORDER — INSULIN ASPART 100 UNIT/ML ~~LOC~~ SOLN
0.0000 [IU] | Freq: Three times a day (TID) | SUBCUTANEOUS | Status: DC
  Administered 2013-08-04: 5 [IU] via SUBCUTANEOUS
  Administered 2013-08-04: 8 [IU] via SUBCUTANEOUS
  Administered 2013-08-04 – 2013-08-05 (×3): 5 [IU] via SUBCUTANEOUS

## 2013-08-04 MED ORDER — INSULIN ASPART 100 UNIT/ML ~~LOC~~ SOLN
0.0000 [IU] | Freq: Three times a day (TID) | SUBCUTANEOUS | Status: DC
Start: 2013-08-04 — End: 2013-08-04

## 2013-08-04 NOTE — Consult Note (Signed)
WOC wound consult note Reason for Consult: Consult requested for left leg and left arm.  Pt previously had a grease splatter burn her left upper arm last week and developed cellulitis a few days ago.  Left leg with chronic stasis ulcer. Wound type: Left arm with dry peeling skin and few dry scabs which remove easily.  Cellulitis marked to left arm which remains with generalized erythremia and edema; this is being treated with IV antibiotics. No open wounds or drainage at this time which require topical treatment.  Measurement: Left calf with chronic full thickness stasis ulcer.  Wound bed: 2X2X.2cm, 100% moist and yellow  Drainage (amount, consistency, odor) Small amt yellow drainage, no odor Periwound: Generalized edema and erythremia to LLE.  Dry scaly skin surrounding. Dressing procedure/placement/frequency: Foam dressing to protect, absorb drainage, and promote healing. Please re-consult if further assistance is needed.  Thank-you,  Julien Girt MSN, [place], [place], [place], [place]

## 2013-08-04 NOTE — ED Provider Notes (Signed)
I saw and evaluated the patient, reviewed the resident's note and I agree with the findings and plan.  Pt c/o fevers, worsening cellulitis RUE.  RUE cellulitis, radial pulse 2+, normal cap refill in fingers. Labs, ivf, iv abx, admission.    Mirna Mires, MD 08/04/13 [phone]

## 2013-08-04 NOTE — Progress Notes (Signed)
Subjective: Pt feels better today. Tolerating oral diet, no nausea or vomiting.   Objective: Vital signs in last 24 hours: Filed Vitals:   08/04/13 0743 08/04/13 0818 08/04/13 1141 08/04/13 1515  BP:   145/59 167/66  Pulse:   89 89  Temp:  98.4 F (36.9 C)  98.3 F (36.8 C)  TempSrc:  Oral  Oral  Resp:    17  Height:      Weight:      SpO2: 93%  94% 96%   Weight change:   Intake/Output Summary (Last 24 hours) at 08/04/13 1703 Last data filed at 08/04/13 0600  Gross per 24 hour  Intake 2193.75 ml  Output    400 ml  Net 1793.75 ml   GENERAL- alert, lying in bed, in no distress. HEENT- Atraumatic, normocephalic, EOMI, oral mucosa appears very moist,some minimal whitish lesions on tongue, neck supple.  CARDIAC- RRR, no murmurs, rubs or gallops.  RESP- Moving equal volumes of air, no added sounds. ABDOMEN- Soft, obese, nontender, bowel sounds present.Marland Kitchen  NEURO- No obvious Cr N abnormality, strenght upper and lower extremities- 5/5.  EXTREMITIES- LUE- Significantly bigger than RUE, redness, mostly in the medial aspect, with crusting, warm, extending from the mid arm to the mid forearm,mildly tender, not fluctuant- Resolving. RLE- hyperpigmentation, with dry skin, from ankles to mid leg, LLE- hypigmentation with chronic ulcer- medial aspect, ~3 by 3cm, appears to be healing, pedal edema without pitting. Sole of feet- hypig ~1 by ~2cm lesion on sole of feet. Multiple skin tags on pts body.   Lab Results: Basic Metabolic Panel:  Recent Labs Lab 08/03/13 1415 08/04/13 0317  NA 134* 137  K 4.3 3.6*  CL 91* 99  CO2 26 25  GLUCOSE 336* 168*  BUN 15 15  CREATININE 0.90 0.94  CALCIUM 9.1 8.4   Liver Function Tests:  Recent Labs Lab 08/03/13 1415  AST 22  ALT 18  ALKPHOS 113  BILITOT 0.5  PROT 7.8  ALBUMIN 3.7   CBC:  Recent Labs Lab 08/03/13 1415 08/04/13 0317  WBC 17.9* 9.9  NEUTROABS 16.5*  --   HGB 15.3* 14.4  HCT 44.4 41.7  MCV 85.5 86.5  PLT 207 179     CBG:  Recent Labs Lab 08/03/13 2121 08/04/13 0834 08/04/13 1140  GLUCAP 242* 224* 263*   Hemoglobin A1C:  Recent Labs Lab 08/03/13 1415  HGBA1C 8.7*   Urine Drug Screen: Drugs of Abuse     Component Value Date/Time   LABOPIA NONE DETECTED 08/05/2012 1142   COCAINSCRNUR NONE DETECTED 08/05/2012 1142   LABBENZ NONE DETECTED 08/05/2012 1142   AMPHETMU NONE DETECTED 08/05/2012 1142   THCU NONE DETECTED 08/05/2012 1142   LABBARB NONE DETECTED 08/05/2012 1142    Urinalysis:  Recent Labs Lab 08/03/13 1446  COLORURINE YELLOW  LABSPEC 1.020  PHURINE 7.0  GLUCOSEU >1000*  HGBUR TRACE*  BILIRUBINUR NEGATIVE  KETONESUR NEGATIVE  PROTEINUR 30*  UROBILINOGEN 0.2  NITRITE NEGATIVE  LEUKOCYTESUR NEGATIVE    Micro Results: Recent Results (from the past 240 hour(s))  CULTURE, BLOOD (ROUTINE X 2)     Status: None   Collection Time    08/03/13  2:15 PM      Result Value Ref Range Status   Specimen Description BLOOD FOREARM   Final   Special Requests BOTTLES DRAWN AEROBIC AND ANAEROBIC 5MLS   Final   Culture  Setup Time     Final   Value: 08/03/2013 19:32  Performed at [facility]     Final   Value:        BLOOD CULTURE RECEIVED NO GROWTH TO DATE CULTURE WILL BE HELD FOR 5 DAYS BEFORE ISSUING A FINAL NEGATIVE REPORT     Performed at [facility]   Report Status PENDING   Incomplete  URINE CULTURE     Status: None   Collection Time    08/03/13  2:46 PM      Result Value Ref Range Status   Specimen Description URINE, RANDOM   Final   Special Requests NONE   Final   Culture  Setup Time     Final   Value: 08/03/2013 19:51     Performed at [facility]     Final   Value: >=100,000 COLONIES/ML     Performed at [facility]   Culture     Final   Value: Desha     Performed at [facility]   Report Status PENDING   Incomplete  CULTURE, BLOOD (ROUTINE X 2)     Status: None   Collection Time     08/03/13  3:00 PM      Result Value Ref Range Status   Specimen Description BLOOD LEFT HAND   Final   Special Requests BOTTLES DRAWN AEROBIC AND ANAEROBIC 10MLS   Final   Culture  Setup Time     Final   Value: 08/03/2013 19:32     Performed at [facility]   Culture     Final   Value:        BLOOD CULTURE RECEIVED NO GROWTH TO DATE CULTURE WILL BE HELD FOR 5 DAYS BEFORE ISSUING A FINAL NEGATIVE REPORT     Performed at [facility]   Report Status PENDING   Incomplete  MRSA PCR SCREENING     Status: None   Collection Time    08/03/13  5:24 PM      Result Value Ref Range Status   MRSA by PCR NEGATIVE  NEGATIVE Final   Comment:            The GeneXpert MRSA Assay (FDA     approved for NASAL specimens     only), is one component of a     comprehensive MRSA colonization     surveillance program. It is not     intended to diagnose MRSA     infection nor to guide or     monitor treatment for     MRSA infections.   Studies/Results: Dg Chest Port 1 View  (if Code Sepsis Called)  08/03/2013   CLINICAL DATA:  Shortness of breath and fever  EXAM: PORTABLE CHEST - 1 VIEW  COMPARISON:  01/17/2013  FINDINGS: Cardiac shadow is stable. The lungs are well aerated bilaterally without focal infiltrate or sizable effusion. No acute bony abnormality is noted.  IMPRESSION: No acute abnormality noted.   Electronically Signed   By: Inez Catalina M.D.   On: 08/03/2013 14:24   Medications: I have reviewed the patient's current medications. Scheduled Meds: . amLODipine  5 mg Oral Daily  . enoxaparin (LOVENOX) injection  60 mg Subcutaneous Q24H  . insulin aspart  0-15 Units Subcutaneous TID WC  . insulin aspart  0-5 Units Subcutaneous QHS  . methadone  40 mg Oral 3 times per day  . mometasone-formoterol  2 puff Inhalation BID  . nystatin  500,000  Units Oral QID  . piperacillin-tazobactam (ZOSYN)  IV  3.375 g Intravenous 3 times per day  . pregabalin  50 mg Oral TID  . sodium chloride   3 mL Intravenous Q12H  . vancomycin  1,250 mg Intravenous Q12H   Continuous Infusions:  PRN Meds:.albuterol, promethazine Assessment/Plan:  Sepsis from Cellulitis- Resolving  - Blood cultures pending  - IV vanc and Zosyn started in the Ed  - Bmet in the Am  - CBC in the Am   Diabetes 2- Blood sugars elevated, CBGs- 336. Increased Anion gap, but no suspicion of DKA- with negative urine Ketones, and normal Serum bicarb. Metformin made her have diarrhea in the past, pt amenable to starting it again on discharge. Pt want to establish care in our clinic..  - SSI-  - HgbA1c  - Diet Carb mod   Chronic leg ulcer- Likely from stasis dermatitis.  - Wound care consult.   Dysuria- UA and microscopy presently not suggestive. On IV antibiotic- Vanc and Zosyn.  - Urine cultures pending.   COPD- Some wheezing present on admission, resolved. Albuterol inhaler. Home meds- Dulera.  - Cont Dulera  - Albuterol nebs- Q2H PRN   Oral thrush- Pt says despite rinsing her mouth after using inhaler she still developed a thrush.  - Nystatin suspension  - HIV.  Chronic Pain- Will continue Pain meds on admission. [facility], prescribes pts pain meds. Also on Lyrica.  - Continue methadone on admission.   Lice infestation and hyperpig lesion on sole- treat with Permitrin 15 solution.  - Dermatology refferal as an out-pt for hyperpig lesion on sole and removal of skin tags.   Dispo: Disposition is deferred at this time, awaiting improvement of current medical problems.  Anticipated discharge in approximately 1-2 day(s).   The patient does not have a current PCP (No Pcp Per Patient) and does need an [facility] hospital follow-up appointment after discharge.  The patient does not know have transportation limitations that hinder transportation to clinic appointments.  .Services Needed at time of discharge: Y = Yes, Blank = No PT:   OT:   RN:   Equipment:   Other:     LOS: 1 day   Jenetta Downer,  MD 08/04/2013, 5:03 PM

## 2013-08-04 NOTE — H&P (Signed)
  Date: 08/04/2013  Patient name: [name]  Medical record number: [id number]  Date of birth: 09-25-1955   I have seen and evaluated [name] and discussed their care with the Residency Team. [name] was admitted for a L UE cellulitis that occurred after getting some grease burns and blisters which burst. She was seen in the ED and was Rx'd Clinda for secondary cellulitis. Her compliance with the doxy is uncertain. She was noted to be septic on admission and was started on Vanc and zosyn. This AM, there might be slight decrease in the borders of the cellulitis. Overall, she feels better.   Her LLE leg ulcer was eval by wound care and needs only dressing changes. She has B chronic venous insuff with prior ulcers on the L.   She has DM and had D to both metformin and a sulfonyurea. She has been off all meds for 6 months. Her A1C is 8.7.   She describes Oropharyngeal dysphagia to liquids for one year. No weight loss.   She has chronic pain treated at the [facility] with methadone, hydrocodone, and lyrica.   Vitals now stable. NAD. LUE : erythematous with flaking dry skin. No warmth. LLE bandage. CVI changes L>R. L sole there is about a 1 cm dark flat lesion with irregular borders. Skin tags L neck. No thrush.  Labs reviewed   Assessment and Plan: I have seen and evaluated the patient as outlined above. I agree with the formulated Assessment and Plan as detailed in the residents' admission note, with the following changes:   1. Sepsis 2/2 LUE cellulitis - cont vanc and zosyn and reassess in AM and narrow if possible. Trigger was likely skin breakdown from the blisters from grease.  2. DM - pt is OK with restarting metformin at D/C. Will need outpt F/U.  3. Dysphagia - this can be W/U as an outpt. No red flag sxs.   4. Thrush - agree wtih HIV and nystatin.   5. Hyperpigmented lesion L foot and skin tags - outpt derm appt  Otherwise, per Dr Wayland Salinas note. Transfer out pt step  down. D/C 11th or 12th.   Bartholomew Crews, MD 6/10/20154:06 PM

## 2013-08-04 NOTE — Progress Notes (Signed)
Inpatient Diabetes Program Recommendations  AACE/ADA: New Consensus Statement on Inpatient Glycemic Control (2013)  Target Ranges:  Prepandial:   less than 140 mg/dL      Peak postprandial:   less than 180 mg/dL (1-2 hours)      Critically ill patients:  140 - 180 mg/dL      Results for AVRY, MONTELEONE (MRN [id number]) as of 08/04/2013 07:21  Ref. Range 08/04/2013 03:17  Glucose Latest Range: 70-99 mg/dL 168 (H)    Results for AVARI, NEVARES (MRN [id number]) as of 08/04/2013 07:21  Ref. Range 08/03/2013 14:15  Hemoglobin A1C Latest Range: <5.7 % 8.7 (H)     Admitted with Fever.  History of DM2, HTN, COPD, Hep C.  Home DM Meds: None listed   **Note patient to start Novolog Moderate SSI today  **Note that patient's A1C is above goal and that it appears patient is not taking any medication to control her CBGs at home presently    MD- Please consider initiation of home oral DM Medications at time of d/c  Patient also needs assistance getting established with Primary Care Provider- Per records, pt does not currently have PCP   Will follow Wyn Quaker RN, MSN, CDE Diabetes Coordinator Inpatient Diabetes Program Team Pager: [phone] (8a-10p)

## 2013-08-04 NOTE — Progress Notes (Signed)
Utilization review completed.  

## 2013-08-05 DIAGNOSIS — R197 Diarrhea, unspecified: Secondary | ICD-10-CM

## 2013-08-05 LAB — GLUCOSE, CAPILLARY
GLUCOSE-CAPILLARY: 214 mg/dL — AB (ref 70–99)
GLUCOSE-CAPILLARY: 237 mg/dL — AB (ref 70–99)

## 2013-08-05 LAB — URINE CULTURE: Colony Count: >=100000

## 2013-08-05 LAB — HIV ANTIBODY (ROUTINE TESTING W REFLEX): HIV 1&2 Ab, 4th Generation: NONREACTIVE

## 2013-08-05 LAB — CLOSTRIDIUM DIFFICILE BY PCR: Toxigenic C. Difficile by PCR: NEGATIVE

## 2013-08-05 MED ORDER — AMLODIPINE BESYLATE 10 MG PO TABS: 10.0000 mg | ORAL_TABLET | Freq: Every day | ORAL | Status: DC

## 2013-08-05 MED ORDER — SULFAMETHOXAZOLE-TMP DS 800-160 MG PO TABS: 1.0000 | ORAL_TABLET | Freq: Two times a day (BID) | ORAL | Status: DC

## 2013-08-05 MED ORDER — METFORMIN HCL 500 MG PO TABS: 500.0000 mg | ORAL_TABLET | Freq: Two times a day (BID) | ORAL | Status: DC

## 2013-08-05 MED ORDER — SULFAMETHOXAZOLE-TMP DS 800-160 MG PO TABS
1.0000 | ORAL_TABLET | Freq: Two times a day (BID) | ORAL | Status: DC
  Administered 2013-08-05: 1 via ORAL
  Filled 2013-08-05: qty 1

## 2013-08-05 MED ORDER — AMLODIPINE BESYLATE 10 MG PO TABS
10.0000 mg | ORAL_TABLET | Freq: Every day | ORAL | Status: DC
  Administered 2013-08-05: 10 mg via ORAL
  Filled 2013-08-05: qty 1

## 2013-08-05 NOTE — Progress Notes (Signed)
Did thorough examination of patient hair and scalp. Saw no evidence of nits or live lice, only dandruff.

## 2013-08-05 NOTE — Progress Notes (Addendum)
Subjective: No complaints today. Itching from the lice is resolving. Redness on fore arm also resolving. Pt says she has had 3 episodes of diarrhea since yesterday.   Objective: Vital signs in last 24 hours: Filed Vitals:   08/04/13 1515 08/04/13 1811 08/04/13 2152 08/05/13 0610  BP: 167/66 164/65 150/71 158/73  Pulse: 89 82 89 79  Temp: 98.3 F (36.8 C) 97.5 F (36.4 C) 98.1 F (36.7 C) 97.9 F (36.6 C)  TempSrc: Oral Oral Oral Oral  Resp: 17 17 16 16   Height:      Weight:      SpO2: 96% 95% 96% 95%   Weight change:   Intake/Output Summary (Last 24 hours) at 08/05/13 1245 Last data filed at 08/05/13 0954  Gross per 24 hour  Intake    300 ml  Output   1400 ml  Net  -1100 ml   GENERAL- alert, lying in bed, in no distress, daughter present. HEENT- Atraumatic, normocephalic, EOMI, oral mucosa appears very moist, thrush on tongue has resolved. CARDIAC- RRR, no murmurs, rubs or gallops.  RESP- Moving equal volumes of air, no crackles or wheezes. ABDOMEN- Soft, obese, nontender, bowel sounds present.Marland Kitchen  NEURO- No obvious Cr N abnormality, strenght upper and lower extremities- 5/5.  EXTREMITIES- Redness LUE, - improving today. LLE- hypigmentation with chronic ulcer- medial aspect, ~3 by 3cm, appears to be healing, pedal edema without pitting. Sole of feet- hypig ~1 by ~2cm lesion on sole of feet. Multiple skin tags on pts body.   Lab Results: Basic Metabolic Panel:  Recent Labs Lab 08/03/13 1415 08/04/13 0317  NA 134* 137  K 4.3 3.6*  CL 91* 99  CO2 26 25  GLUCOSE 336* 168*  BUN 15 15  CREATININE 0.90 0.94  CALCIUM 9.1 8.4   Liver Function Tests:  Recent Labs Lab 08/03/13 1415  AST 22  ALT 18  ALKPHOS 113  BILITOT 0.5  PROT 7.8  ALBUMIN 3.7   CBC:  Recent Labs Lab 08/03/13 1415 08/04/13 0317  WBC 17.9* 9.9  NEUTROABS 16.5*  --   HGB 15.3* 14.4  HCT 44.4 41.7  MCV 85.5 86.5  PLT 207 179   CBG:  Recent Labs Lab 08/04/13 0834 08/04/13 1140  08/04/13 1707 08/04/13 2149 08/05/13 0752 08/05/13 1207  GLUCAP 224* 263* 218* 187* 214* 237*   Hemoglobin A1C:  Recent Labs Lab 08/03/13 1415  HGBA1C 8.7*   Urine Drug Screen: Drugs of Abuse     Component Value Date/Time   LABOPIA NONE DETECTED 08/05/2012 1142   COCAINSCRNUR NONE DETECTED 08/05/2012 1142   LABBENZ NONE DETECTED 08/05/2012 1142   AMPHETMU NONE DETECTED 08/05/2012 1142   THCU NONE DETECTED 08/05/2012 1142   LABBARB NONE DETECTED 08/05/2012 1142    Urinalysis:  Recent Labs Lab 08/03/13 1446  COLORURINE YELLOW  LABSPEC 1.020  PHURINE 7.0  GLUCOSEU >1000*  HGBUR TRACE*  BILIRUBINUR NEGATIVE  KETONESUR NEGATIVE  PROTEINUR 30*  UROBILINOGEN 0.2  NITRITE NEGATIVE  LEUKOCYTESUR NEGATIVE    Micro Results: Recent Results (from the past 240 hour(s))  CULTURE, BLOOD (ROUTINE X 2)     Status: None   Collection Time    08/03/13  2:15 PM      Result Value Ref Range Status   Specimen Description BLOOD FOREARM   Final   Special Requests BOTTLES DRAWN AEROBIC AND ANAEROBIC 5MLS   Final   Culture  Setup Time     Final   Value: 08/03/2013 19:32  Performed at [facility]     Final   Value:        BLOOD CULTURE RECEIVED NO GROWTH TO DATE CULTURE WILL BE HELD FOR 5 DAYS BEFORE ISSUING A FINAL NEGATIVE REPORT     Performed at [facility]   Report Status PENDING   Incomplete  URINE CULTURE     Status: None   Collection Time    08/03/13  2:46 PM      Result Value Ref Range Status   Specimen Description URINE, RANDOM   Final   Special Requests NONE   Final   Culture  Setup Time     Final   Value: 08/03/2013 19:51     Performed at [place]     Final   Value: >=100,000 COLONIES/ML     Performed at [facility]   Culture     Final   Value: CITROBACTER KOSERI     Performed at [facility]   Report Status 08/05/2013 FINAL   Final   Organism ID, Bacteria CITROBACTER KOSERI   Final  CULTURE,  BLOOD (ROUTINE X 2)     Status: None   Collection Time    08/03/13  3:00 PM      Result Value Ref Range Status   Specimen Description BLOOD LEFT HAND   Final   Special Requests BOTTLES DRAWN AEROBIC AND ANAEROBIC 10MLS   Final   Culture  Setup Time     Final   Value: 08/03/2013 19:32     Performed at [facility]   Culture     Final   Value:        BLOOD CULTURE RECEIVED NO GROWTH TO DATE CULTURE WILL BE HELD FOR 5 DAYS BEFORE ISSUING A FINAL NEGATIVE REPORT     Performed at [facility]   Report Status PENDING   Incomplete  MRSA PCR SCREENING     Status: None   Collection Time    08/03/13  5:24 PM      Result Value Ref Range Status   MRSA by PCR NEGATIVE  NEGATIVE Final   Comment:            The GeneXpert MRSA Assay (FDA     approved for NASAL specimens     only), is one component of a     comprehensive MRSA colonization     surveillance program. It is not     intended to diagnose MRSA     infection nor to guide or     monitor treatment for     MRSA infections.   Studies/Results: Dg Chest Port 1 View  (if Code Sepsis Called)  08/03/2013   CLINICAL DATA:  Shortness of breath and fever  EXAM: PORTABLE CHEST - 1 VIEW  COMPARISON:  01/17/2013  FINDINGS: Cardiac shadow is stable. The lungs are well aerated bilaterally without focal infiltrate or sizable effusion. No acute bony abnormality is noted.  IMPRESSION: No acute abnormality noted.   Electronically Signed   By: Inez Catalina M.D.   On: 08/03/2013 14:24   Medications: I have reviewed the patient's current medications. Scheduled Meds: . amLODipine  10 mg Oral Daily  . enoxaparin (LOVENOX) injection  60 mg Subcutaneous Q24H  . insulin aspart  0-15 Units Subcutaneous TID WC  . insulin aspart  0-5 Units Subcutaneous QHS  . methadone  40 mg Oral 3 times per day  . mometasone-formoterol  2 puff Inhalation BID  . nystatin  500,000 Units Oral QID  . pregabalin  50 mg Oral TID  . sodium chloride  3 mL Intravenous  Q12H  . sulfamethoxazole-trimethoprim  1 tablet Oral Q12H   Continuous Infusions:  PRN Meds:.albuterol, promethazine Assessment/Plan:  Sepsis from Cellulitis- Resolving , Appears much better today. - Blood cultures pending  - Dc IV vanc and Zosyn started in the Ed- switch to PO- Bactrim- DS- BID for 2 weeks.   Diarrhea- Started yesterday, non bloody. - Stool for c.diff, if negative conservative mangement.  Diabetes 2-  - SSI-  - HgbA1c  - Diet Carb mod   Chronic leg ulcer- Likely from stasis dermatitis.  - Wound care consult.   Dysuria- UA and microscopy presently not suggestive. On IV antibiotic- Vanc and Zosyn.  - Urine cultures >100 000 colonies of Citobacter Koseri.    - Sensitive to bactrim.   COPD- Some wheezing present on admission, resolved. Albuterol inhaler. Home meds- Dulera.  - Cont Dulera  - Albuterol nebs- Q2H PRN   Oral thrush- Resolved. Pt says despite rinsing her mouth after using inhaler she still developed a thrush.  - Nystatin suspension  - HIV- Negative.  Chronic Pain- Will continue Pain meds on admission. [facility], prescribes pts pain meds. Also on Lyrica.  - Continue methadone on admission.   Lice infestation and hyperpig lesion on sole- treat with Permitrin 15 solution.  - Dermatology refferal as an out-pt for hyperpig lesion on sole and removal of skin tags.  Addendum- Pt was tested in the past for Hep C and was positive, but Titres were negative, the result was therefore a false positive result. This has been removed patients problem list.  Dispo: Disposition is deferred at this time, awaiting improvement of current medical problems.  Anticipated discharge in approximately 1-2 day(s).   The patient does not have a current PCP (No Pcp Per Patient) and does need an [facility] follow-up appointment after discharge.  The patient does not know have transportation limitations that hinder transportation to clinic appointments.  .Services  Needed at time of discharge: Y = Yes, Blank = No PT:   OT:   RN:   Equipment:   Other:     LOS: 2 days   Jenetta Downer, MD 08/05/2013, 12:45 PM

## 2013-08-05 NOTE — Progress Notes (Signed)
Inpatient Diabetes Program Recommendations  AACE/ADA: New Consensus Statement on Inpatient Glycemic Control (2013)  Target Ranges:  Prepandial:   less than 140 mg/dL      Peak postprandial:   less than 180 mg/dL (1-2 hours)      Critically ill patients:  140 - 180 mg/dL   Reason for Assessment:  Results for [name], [name] (MRN [id number]) as of 08/05/2013 13:32  Ref. Range 08/04/2013 11:40 08/04/2013 17:07 08/04/2013 21:49 08/05/2013 07:52 08/05/2013 12:07  Glucose-Capillary Latest Range: 70-99 mg/dL 263 (H) 218 (H) 187 (H) 214 (H) 237 (H)  Results for Chelsea, Vasquez (MRN [id number]) as of 08/05/2013 13:32  Ref. Range 08/03/2013 14:15  Hemoglobin A1C Latest Range: <5.7 % 8.7 (H)    Diabetes history: Type 2 Diabetes Outpatient Diabetes medications: None listed Current orders for Inpatient glycemic control: Novolog moderate tid with meals and HS scale  CBG's continue to be greater than goal.  Please consider adding basal insulin, Levemir 20 units daily while in hospital.  Also it appears that patient will need diabetes medications added at discharge based on high A1C.    Thanks,  Adah Perl, RN, BC-ADM Inpatient Diabetes Coordinator Pager [phone]

## 2013-08-05 NOTE — Discharge Summary (Signed)
Name: [name] MRN: [id number] DOB: 1956/02/05 58 y.o. PCP: No Pcp Per Patient  Date of Admission: 08/03/2013  1:43 PM Date of Discharge: 08/05/2013 Attending Physician: Bartholomew Crews, MD;*  Discharge Diagnosis: Active Problems:   DIABETES MELLITUS, TYPE II   HYPERTENSION   COPD   Sepsis   Lice infested hair  Discharge Medications:   Medication List    STOP taking these medications       clindamycin 150 MG capsule  Commonly known as:  CLEOCIN      TAKE these medications       albuterol 108 (90 BASE) MCG/ACT inhaler  Commonly known as:  PROVENTIL HFA;VENTOLIN HFA  Inhale 2 puffs into the lungs every 4 (four) hours as needed for wheezing or shortness of breath.     amLODipine 10 MG tablet  Commonly known as:  NORVASC  Take 1 tablet (10 mg total) by mouth daily.     cyclobenzaprine 10 MG tablet  Commonly known as:  FLEXERIL  Take 10 mg by mouth at bedtime as needed for muscle spasms.     ibuprofen 200 MG tablet  Commonly known as:  ADVIL,MOTRIN  Take 400-600 mg by mouth every 6 (six) hours as needed for fever, headache or moderate pain.     metFORMIN 500 MG tablet  Commonly known as:  GLUCOPHAGE  Take 1 tablet (500 mg total) by mouth 2 (two) times daily with a meal.     methadone 10 MG tablet  Commonly known as:  DOLOPHINE  Take 40 mg by mouth every 8 (eight) hours.     nystatin 100000 UNIT/ML suspension  Commonly known as:  MYCOSTATIN  Take 5 mLs (500,000 Units total) by mouth 4 (four) times daily.     pregabalin 50 MG capsule  Commonly known as:  LYRICA  Take 50 mg by mouth every 8 (eight) hours. 0600, 1400, 2200     sulfamethoxazole-trimethoprim 800-160 MG per tablet  Commonly known as:  BACTRIM DS  Take 1 tablet by mouth 2 (two) times daily.        Disposition and follow-up:   [name] was discharged from [facility] in Good condition.  At the hospital follow up visit please address:  1.  Resolution of left Arm  cellulitis. Compliance with 2 weeks of Bactrim. Pt needs to follow up with a PCP, this could not be arrange at discharge, she wanted to follow up with [facility] for her chronic medical conditions. Still having lice infestation? With Itching?  2.  Labs / imaging needed at time of follow-up: CBC- no  Leukocytosis. BMP- Na, K, BUn, Cr.   3.  Pending labs/ test needing follow-up: None  Follow-up Appointments:     Follow-up Information   Follow up with Clinton Gallant, MD On 08/12/2013. (At 8.45am)    Specialty:  Internal Medicine   Contact information:   [place] [id number] [phone]       Discharge Instructions: Discharge Instructions   Diet - low sodium heart healthy    Complete by:  As directed      Discharge instructions    Complete by:  As directed   We will like you to follow up with a new PCP with [facility], but we scheduled a 1 time follow up appointment with [place], just to be sure the infection in your arm is resolving.   We prescribed a medication called bactrim- which you will  take- one tablet twice a day for 10 days.  The stool test came back negative, so just take lots of water, so you do not become dehydrated. The loose stools will resolve gradually.  Do not start taking the metformin till Sunday, when your diarrhea should have resolved. Please take one tablet twice a day.  Control what you eat, avoid fatty foods and regular soda, to help control your blood sugars.  We also prescribed a blood pressure medication- called amlodipine, which you will take once a day.     Increase activity slowly    Complete by:  As directed            Consultations:  None  Procedures Performed:  Dg Chest Port 1 View  (if Code Sepsis Called)  08/03/2013   CLINICAL DATA:  Shortness of breath and fever  EXAM: PORTABLE CHEST - 1 VIEW  COMPARISON:  01/17/2013  FINDINGS: Cardiac shadow is stable. The lungs are well aerated bilaterally without focal  infiltrate or sizable effusion. No acute bony abnormality is noted.  IMPRESSION: No acute abnormality noted.   Electronically Signed   By: Inez Catalina M.D.   On: 08/03/2013 14:24    2D Echo: None.  Cardiac Cath: None  Admission HPI: Chief Complaint: Fever.   History of Present Illness: 89 y o F with PMH of Hep C, HLD, Morbid obesity, OSA, PVD, DM2, HTN, COPD, SIRs, tobacco abuse, OA and Back pain- On methadone. Presented today with c/o fever- 2 days duration. Pt previously was seen in the Ed on the 4th of this month when she came in with complaints of left arm swelling, started 2 days prior to presentation in the Ed on the 4th, pt says some grease dropped on her arm. Pain has increased in severity since then. Pt was given Prescription for Clindamycin and discharged home. She says she took th clindamycin for 4 days, this morning was the last time she took the medication. She was told to come back to tyeh ED if she spiked a fever.  No diarrhea or Vomiting, but endorses poor Po intake. No hx of trauma.  Physical Exam:  Blood pressure 180/78, pulse 113, temperature 100.8 F (38.2 C), temperature source Oral, resp. rate 22, height 5\' 6"  (1.676 m), weight 280 lb (127.007 kg), SpO2 96.00%.  GENERAL- alert, co-operative, appears as stated age, not in any distress, sitting in bed comfortably.  HEENT- Atraumatic, normocephalic, EOMI, oral mucosa appears very dry, some minimal whitish lesions on tongue, neck supple.  CARDIAC- RRR, no murmurs, rubs or gallops.  RESP- Moving equal volumes of air, has mild bilat inspiratory wheezes- Lower lung zones bilat, No crackles.  ABDOMEN- Soft, obese, nontender, no guarding or rebound, no palpable masses or organomegaly, bowel sounds present.Marland Kitchen  NEURO- No obvious Cr N abnormality, strenght upper and lower extremities- 5/5.  EXTREMITIES- LUE- Significantly bigger than RUE, redness, mostly in the medial aspect, with crusting, warm, extending from the mid arm to the mid  forearm,mildly tender, not fluctuant. RLE- hyperpigmentation, with dry skin, from ankles to mid leg, LLE- hypigmentation with chronic ulcer- medial aspect, ~3 by 3cm, appears to be healing, pedal edema without pitting.  SKIN- Warm, dry, No rash or lesion.  PSYCH- Normal mood and affect, appropriate thought content and speech.    Hospital Course by problem list:   Sepsis from Left upper arm- cellulitis-  Which started as grease burns, was initially prescibed clindamycin when she came to the Ed, about 6 days prior, which  she took without improvement, and so came back to the Ed increasing arm redness, fever- high- 102.8, charted on admission, in the Ed, also with tachycardia- up to 116, tachypnea- up to 26, Leukocytosis- 17.9. Lactic acid elevated- likely from sepsis. Also patient had a anion gap- Likely from Lactic acidosis. Blood cultures final results were negative for growth. Pt completed 2 days of IV Vanc and Zosyn, was hydrated and was discharged home to complete a 2 week course of bactrim. On discharge, pt had marked improvement in vitals and arm cellulitis, no leukocytosis, normal lactic acid, without anion gap, was to follow up with [facility] and subsequently with her PCP.  Diabetes 2- Blood sugars elevated, CBGs- 336. Not on any home meds. Increased Anion gap, but no suspicion of DKA- with negative urine Ketones, and normal Serum bicarb. HgbA1c- 8.7. Was discharged home to start Metformin at 500mg  BID.  Diarrhea started on admission- but was resolving on discharge. C. Diff- was negative.  Chronic leg ulcer- Likely from stasis dermatitis. Wound care was consulted.   Dysuria- UA and microscopy presently not suggestive. Urine cultures pending,Urine cultures >100 000 colonies of Citobacter Koseri which was sensitive for Bactrim amongst other antimicrobials. Was on Iv vanc and zosyn in the hospital, and discharged home on Bactrim for 2 weeks.     COPD- Some wheezing present on exam. Albuterol inhaler.  Home meds- albuterol inhaler and Dulera,. Was placed on nebs while on admission. Home meds continued on discharge.   Oral thrush- Pt says despite rinsing her mouth after using inhaler she still developed a thrush. Home Nystatin suspension continued on admission and on discharge. this had largely cleared by discharge.  Chronic Pain- Will continue Pain meds on admission. [facility], prescribes pts pain meds. Also on Lyrica. Continue methadone on admission and on discharge.   Lice infestation- Neck and hair.Treated with Permitrin 15 solution. On admission.    Discharge Vitals:   BP 158/73  Pulse 79  Temp(Src) 97.9 F (36.6 C) (Oral)  Resp 16  Ht 5\' 6"  (1.676 m)  Wt 301 lb 2.4 oz (136.6 kg)  BMI 48.63 kg/m2  SpO2 95%  Discharge Labs:  Results for orders placed during the hospital encounter of 08/03/13 (from the past 24 hour(s))  GLUCOSE, CAPILLARY     Status: Abnormal   Collection Time    08/04/13  5:07 PM      Result Value Ref Range   Glucose-Capillary 218 (*) 70 - 99 mg/dL  GLUCOSE, CAPILLARY     Status: Abnormal   Collection Time    08/04/13  9:49 PM      Result Value Ref Range   Glucose-Capillary 187 (*) 70 - 99 mg/dL   Comment 1 Notify RN     Comment 2 Documented in Chart    GLUCOSE, CAPILLARY     Status: Abnormal   Collection Time    08/05/13  7:52 AM      Result Value Ref Range   Glucose-Capillary 214 (*) 70 - 99 mg/dL   Comment 1 Notify RN    CLOSTRIDIUM DIFFICILE BY PCR     Status: None   Collection Time    08/05/13 10:49 AM      Result Value Ref Range   C difficile by pcr NEGATIVE  NEGATIVE  GLUCOSE, CAPILLARY     Status: Abnormal   Collection Time    08/05/13 12:07 PM      Result Value Ref Range   Glucose-Capillary 237 (*) 70 -  99 mg/dL    Signed: Jenetta Downer, MD 08/05/2013, 2:44 PM   Time Spent on Discharge: 35 minutes Services Ordered on Discharge: None Equipment Ordered on Discharge: None

## 2013-08-05 NOTE — Discharge Instructions (Signed)

## 2013-08-09 LAB — CULTURE, BLOOD (ROUTINE X 2)
CULTURE: NO GROWTH
Culture: NO GROWTH

## 2013-08-12 ENCOUNTER — Ambulatory Visit: Payer: Medicaid Other | Admitting: Internal Medicine

## 2013-08-16 ENCOUNTER — Encounter (HOSPITAL_BASED_OUTPATIENT_CLINIC_OR_DEPARTMENT_OTHER): Payer: Medicaid Other | Attending: Plastic Surgery

## 2013-12-10 ENCOUNTER — Other Ambulatory Visit: Payer: Self-pay

## 2014-03-20 ENCOUNTER — Inpatient Hospital Stay (HOSPITAL_COMMUNITY)
Admission: EM | Admit: 2014-03-20 | Discharge: 2014-03-24 | DRG: 871 | Disposition: A | Discharge status: Home / Self Care | Payer: Medicaid Other | Attending: Internal Medicine | Admitting: Internal Medicine

## 2014-03-20 ENCOUNTER — Emergency Department (HOSPITAL_COMMUNITY): Payer: Medicaid Other

## 2014-03-20 ENCOUNTER — Encounter (HOSPITAL_COMMUNITY): Payer: Self-pay | Admitting: *Deleted

## 2014-03-20 DIAGNOSIS — J9811 Atelectasis: Secondary | ICD-10-CM | POA: Diagnosis present

## 2014-03-20 DIAGNOSIS — N201 Calculus of ureter: Secondary | ICD-10-CM | POA: Diagnosis present

## 2014-03-20 DIAGNOSIS — E860 Dehydration: Secondary | ICD-10-CM | POA: Diagnosis present

## 2014-03-20 DIAGNOSIS — E1165 Type 2 diabetes mellitus with hyperglycemia: Secondary | ICD-10-CM | POA: Diagnosis present

## 2014-03-20 DIAGNOSIS — E11628 Type 2 diabetes mellitus with other skin complications: Secondary | ICD-10-CM | POA: Diagnosis present

## 2014-03-20 DIAGNOSIS — I10 Essential (primary) hypertension: Secondary | ICD-10-CM | POA: Diagnosis present

## 2014-03-20 DIAGNOSIS — M1990 Unspecified osteoarthritis, unspecified site: Secondary | ICD-10-CM | POA: Diagnosis present

## 2014-03-20 DIAGNOSIS — J189 Pneumonia, unspecified organism: Secondary | ICD-10-CM | POA: Diagnosis present

## 2014-03-20 DIAGNOSIS — G894 Chronic pain syndrome: Secondary | ICD-10-CM | POA: Diagnosis present

## 2014-03-20 DIAGNOSIS — A499 Bacterial infection, unspecified: Secondary | ICD-10-CM | POA: Insufficient documentation

## 2014-03-20 DIAGNOSIS — E1121 Type 2 diabetes mellitus with diabetic nephropathy: Secondary | ICD-10-CM | POA: Diagnosis present

## 2014-03-20 DIAGNOSIS — A419 Sepsis, unspecified organism: Secondary | ICD-10-CM | POA: Diagnosis present

## 2014-03-20 DIAGNOSIS — IMO0002 Reserved for concepts with insufficient information to code with codable children: Secondary | ICD-10-CM

## 2014-03-20 DIAGNOSIS — R109 Unspecified abdominal pain: Secondary | ICD-10-CM

## 2014-03-20 DIAGNOSIS — N179 Acute kidney failure, unspecified: Secondary | ICD-10-CM | POA: Insufficient documentation

## 2014-03-20 DIAGNOSIS — J449 Chronic obstructive pulmonary disease, unspecified: Secondary | ICD-10-CM | POA: Diagnosis present

## 2014-03-20 DIAGNOSIS — N390 Urinary tract infection, site not specified: Secondary | ICD-10-CM | POA: Diagnosis present

## 2014-03-20 DIAGNOSIS — J441 Chronic obstructive pulmonary disease with (acute) exacerbation: Secondary | ICD-10-CM | POA: Diagnosis present

## 2014-03-20 DIAGNOSIS — N139 Obstructive and reflux uropathy, unspecified: Secondary | ICD-10-CM

## 2014-03-20 DIAGNOSIS — Z419 Encounter for procedure for purposes other than remedying health state, unspecified: Secondary | ICD-10-CM

## 2014-03-20 DIAGNOSIS — F329 Major depressive disorder, single episode, unspecified: Secondary | ICD-10-CM | POA: Diagnosis present

## 2014-03-20 DIAGNOSIS — Z9119 Patient's noncompliance with other medical treatment and regimen: Secondary | ICD-10-CM | POA: Diagnosis present

## 2014-03-20 DIAGNOSIS — F17210 Nicotine dependence, cigarettes, uncomplicated: Secondary | ICD-10-CM | POA: Diagnosis present

## 2014-03-20 DIAGNOSIS — N136 Pyonephrosis: Secondary | ICD-10-CM

## 2014-03-20 DIAGNOSIS — Z6841 Body Mass Index (BMI) 40.0 and over, adult: Secondary | ICD-10-CM | POA: Diagnosis not present

## 2014-03-20 DIAGNOSIS — R0602 Shortness of breath: Secondary | ICD-10-CM | POA: Diagnosis present

## 2014-03-20 LAB — BASIC METABOLIC PANEL
Anion gap: 11 (ref 5–15)
BUN: 14 mg/dL (ref 6–23)
CALCIUM: 8.6 mg/dL (ref 8.4–10.5)
CO2: 23 mmol/L (ref 19–32)
Chloride: 97 mmol/L (ref 96–112)
Creatinine, Ser: 1.33 mg/dL — ABNORMAL HIGH (ref 0.50–1.10)
GFR calc Af Amer: 51 mL/min — ABNORMAL LOW (ref >90)
GFR, EST NON AFRICAN AMERICAN: 44 mL/min — AB (ref >90)
Glucose, Bld: 183 mg/dL — ABNORMAL HIGH (ref 70–99)
POTASSIUM: 4.2 mmol/L (ref 3.5–5.1)
Sodium: 131 mmol/L — ABNORMAL LOW (ref 135–145)

## 2014-03-20 LAB — URINALYSIS, ROUTINE W REFLEX MICROSCOPIC
BILIRUBIN URINE: NEGATIVE
Glucose, UA: >1000 mg/dL — AB
Ketones, ur: 15 mg/dL — AB
NITRITE: NEGATIVE
PH: 6 (ref 5.0–8.0)
Protein, ur: 100 mg/dL — AB
Specific Gravity, Urine: 1.036 — ABNORMAL HIGH (ref 1.005–1.030)
UROBILINOGEN UA: 0.2 mg/dL (ref 0.0–1.0)

## 2014-03-20 LAB — EXPECTORATED SPUTUM ASSESSMENT W GRAM STAIN, RFLX TO RESP C

## 2014-03-20 LAB — CBC WITH DIFFERENTIAL/PLATELET
BASOS PCT: 0 % (ref 0–1)
Basophils Absolute: 0 10*3/uL (ref 0.0–0.1)
Eosinophils Absolute: 0.1 10*3/uL (ref 0.0–0.7)
Eosinophils Relative: 1 % (ref 0–5)
HCT: 37.3 % (ref 36.0–46.0)
Hemoglobin: 13.3 g/dL (ref 12.0–15.0)
Lymphocytes Relative: 6 % — ABNORMAL LOW (ref 12–46)
Lymphs Abs: 0.9 10*3/uL (ref 0.7–4.0)
MCH: 28.7 pg (ref 26.0–34.0)
MCHC: 35.7 g/dL (ref 30.0–36.0)
MCV: 80.6 fL (ref 78.0–100.0)
Monocytes Absolute: 0.8 10*3/uL (ref 0.1–1.0)
Monocytes Relative: 5 % (ref 3–12)
Neutro Abs: 13.1 10*3/uL — ABNORMAL HIGH (ref 1.7–7.7)
Neutrophils Relative %: 88 % — ABNORMAL HIGH (ref 43–77)
Platelets: 340 10*3/uL (ref 150–400)
RBC: 4.63 MIL/uL (ref 3.87–5.11)
RDW: 12.5 % (ref 11.5–15.5)
WBC: 14.9 10*3/uL — ABNORMAL HIGH (ref 4.0–10.5)

## 2014-03-20 LAB — COMPREHENSIVE METABOLIC PANEL
ALT: 15 U/L (ref 0–35)
AST: 13 U/L (ref 0–37)
Albumin: 2.6 g/dL — ABNORMAL LOW (ref 3.5–5.2)
Alkaline Phosphatase: 122 U/L — ABNORMAL HIGH (ref 39–117)
Anion gap: 11 (ref 5–15)
BUN: 11 mg/dL (ref 6–23)
CHLORIDE: 93 mmol/L — AB (ref 96–112)
CO2: 23 mmol/L (ref 19–32)
Calcium: 8.6 mg/dL (ref 8.4–10.5)
Creatinine, Ser: 1.41 mg/dL — ABNORMAL HIGH (ref 0.50–1.10)
GFR calc Af Amer: 47 mL/min — ABNORMAL LOW (ref >90)
GFR calc non Af Amer: 41 mL/min — ABNORMAL LOW (ref >90)
Glucose, Bld: 465 mg/dL — ABNORMAL HIGH (ref 70–99)
POTASSIUM: 3.7 mmol/L (ref 3.5–5.1)
Sodium: 127 mmol/L — ABNORMAL LOW (ref 135–145)
Total Bilirubin: 0.6 mg/dL (ref 0.3–1.2)
Total Protein: 7.2 g/dL (ref 6.0–8.3)

## 2014-03-20 LAB — TROPONIN I: Troponin I: 0.03 ng/mL (ref <0.031)

## 2014-03-20 LAB — GLUCOSE, CAPILLARY
GLUCOSE-CAPILLARY: 527 mg/dL — AB (ref 70–99)
GLUCOSE-CAPILLARY: 459 mg/dL — AB (ref 70–99)
GLUCOSE-CAPILLARY: 194 mg/dL — AB (ref 70–99)
GLUCOSE-CAPILLARY: 183 mg/dL — AB (ref 70–99)
GLUCOSE-CAPILLARY: 140 mg/dL — AB (ref 70–99)
Glucose-Capillary: 333 mg/dL — ABNORMAL HIGH (ref 70–99)
Glucose-Capillary: 281 mg/dL — ABNORMAL HIGH (ref 70–99)
Glucose-Capillary: 297 mg/dL — ABNORMAL HIGH (ref 70–99)
Glucose-Capillary: 229 mg/dL — ABNORMAL HIGH (ref 70–99)
Glucose-Capillary: 269 mg/dL — ABNORMAL HIGH (ref 70–99)
Glucose-Capillary: 183 mg/dL — ABNORMAL HIGH (ref 70–99)
Glucose-Capillary: 306 mg/dL — ABNORMAL HIGH (ref 70–99)

## 2014-03-20 LAB — I-STAT CG4 LACTIC ACID, ED: LACTIC ACID, VENOUS: 1.25 mmol/L (ref 0.5–2.0)

## 2014-03-20 LAB — URINE MICROSCOPIC-ADD ON

## 2014-03-20 LAB — STREP PNEUMONIAE URINARY ANTIGEN: Strep Pneumo Urinary Antigen: NEGATIVE

## 2014-03-20 LAB — EXPECTORATED SPUTUM ASSESSMENT W REFEX TO RESP CULTURE

## 2014-03-20 LAB — CBG MONITORING, ED: Glucose-Capillary: 561 mg/dL (ref 70–99)

## 2014-03-20 LAB — I-STAT TROPONIN, ED: Troponin i, poc: 0 ng/mL (ref 0.00–0.08)

## 2014-03-20 LAB — BRAIN NATRIURETIC PEPTIDE: B Natriuretic Peptide: 120.4 pg/mL — ABNORMAL HIGH (ref 0.0–100.0)

## 2014-03-20 MED ORDER — ALBUTEROL SULFATE (2.5 MG/3ML) 0.083% IN NEBU
5.0000 mg | INHALATION_SOLUTION | Freq: Once | RESPIRATORY_TRACT | Status: AC
  Administered 2014-03-20: 5 mg via RESPIRATORY_TRACT
  Filled 2014-03-20: qty 6

## 2014-03-20 MED ORDER — ONDANSETRON HCL 4 MG PO TABS: 4.0000 mg | ORAL_TABLET | Freq: Four times a day (QID) | ORAL | Status: DC | PRN

## 2014-03-20 MED ORDER — ENOXAPARIN SODIUM 40 MG/0.4ML ~~LOC~~ SOLN
40.0000 mg | SUBCUTANEOUS | Status: DC
  Administered 2014-03-20 – 2014-03-24 (×4): 40 mg via SUBCUTANEOUS
  Filled 2014-03-20 (×6): qty 0.4

## 2014-03-20 MED ORDER — ACETAMINOPHEN 325 MG PO TABS: 650.0000 mg | ORAL_TABLET | Freq: Once | ORAL | Status: DC

## 2014-03-20 MED ORDER — BUPRENORPHINE HCL 2 MG SL SUBL
8.0000 mg | SUBLINGUAL_TABLET | Freq: Two times a day (BID) | SUBLINGUAL | Status: DC
  Administered 2014-03-20 – 2014-03-24 (×9): 8 mg via SUBLINGUAL
  Filled 2014-03-20 (×9): qty 4

## 2014-03-20 MED ORDER — SODIUM CHLORIDE 0.9 % IV SOLN
INTRAVENOUS | Status: DC
  Administered 2014-03-20: 12 [IU]/h via INTRAVENOUS

## 2014-03-20 MED ORDER — PNEUMOCOCCAL VAC POLYVALENT 25 MCG/0.5ML IJ INJ
0.5000 mL | INJECTION | INTRAMUSCULAR | Status: AC
  Administered 2014-03-21: 0.5 mL via INTRAMUSCULAR
  Filled 2014-03-20: qty 0.5

## 2014-03-20 MED ORDER — ACETAMINOPHEN 325 MG PO TABS
650.0000 mg | ORAL_TABLET | Freq: Once | ORAL | Status: AC
  Administered 2014-03-20: 650 mg via ORAL
  Filled 2014-03-20: qty 2

## 2014-03-20 MED ORDER — PREGABALIN 75 MG PO CAPS
75.0000 mg | ORAL_CAPSULE | Freq: Four times a day (QID) | ORAL | Status: DC
  Administered 2014-03-20 – 2014-03-24 (×17): 75 mg via ORAL
  Filled 2014-03-20 (×2): qty 1
  Filled 2014-03-20: qty 3
  Filled 2014-03-20 (×3): qty 1
  Filled 2014-03-20: qty 3
  Filled 2014-03-20 (×10): qty 1

## 2014-03-20 MED ORDER — DEXTROSE 5 % IV SOLN
500.0000 mg | INTRAVENOUS | Status: DC
  Administered 2014-03-20 – 2014-03-22 (×3): 500 mg via INTRAVENOUS
  Filled 2014-03-20 (×4): qty 500

## 2014-03-20 MED ORDER — ACETAMINOPHEN 650 MG RE SUPP: 650.0000 mg | Freq: Four times a day (QID) | RECTAL | Status: DC | PRN

## 2014-03-20 MED ORDER — HYDRALAZINE HCL 20 MG/ML IJ SOLN
10.0000 mg | INTRAMUSCULAR | Status: DC | PRN
  Administered 2014-03-22: 10 mg via INTRAVENOUS

## 2014-03-20 MED ORDER — CEFTRIAXONE SODIUM IN DEXTROSE 20 MG/ML IV SOLN
1.0000 g | INTRAVENOUS | Status: DC
  Administered 2014-03-21 – 2014-03-22 (×3): 1 g via INTRAVENOUS
  Filled 2014-03-20 (×5): qty 50

## 2014-03-20 MED ORDER — ONDANSETRON HCL 4 MG/2ML IJ SOLN
4.0000 mg | Freq: Four times a day (QID) | INTRAMUSCULAR | Status: DC | PRN
  Administered 2014-03-20 – 2014-03-24 (×3): 4 mg via INTRAVENOUS
  Filled 2014-03-20 (×3): qty 2

## 2014-03-20 MED ORDER — INSULIN ASPART 100 UNIT/ML ~~LOC~~ SOLN
0.0000 [IU] | Freq: Three times a day (TID) | SUBCUTANEOUS | Status: DC
  Administered 2014-03-21: 8 [IU] via SUBCUTANEOUS
  Administered 2014-03-21 (×2): 15 [IU] via SUBCUTANEOUS
  Administered 2014-03-22: 8 [IU] via SUBCUTANEOUS
  Administered 2014-03-23: 5 [IU] via SUBCUTANEOUS
  Administered 2014-03-23: 8 [IU] via SUBCUTANEOUS
  Administered 2014-03-23 – 2014-03-24 (×3): 3 [IU] via SUBCUTANEOUS

## 2014-03-20 MED ORDER — SODIUM CHLORIDE 0.9 % IJ SOLN
3.0000 mL | Freq: Two times a day (BID) | INTRAMUSCULAR | Status: DC
  Administered 2014-03-22 – 2014-03-24 (×5): 3 mL via INTRAVENOUS

## 2014-03-20 MED ORDER — ACETAMINOPHEN 325 MG PO TABS
650.0000 mg | ORAL_TABLET | Freq: Four times a day (QID) | ORAL | Status: DC | PRN
  Administered 2014-03-20 – 2014-03-23 (×8): 650 mg via ORAL
  Filled 2014-03-20 (×8): qty 2

## 2014-03-20 MED ORDER — INSULIN ASPART 100 UNIT/ML ~~LOC~~ SOLN
0.0000 [IU] | Freq: Every day | SUBCUTANEOUS | Status: DC
  Administered 2014-03-20 – 2014-03-21 (×2): 4 [IU] via SUBCUTANEOUS
  Administered 2014-03-22: 5 [IU] via SUBCUTANEOUS

## 2014-03-20 MED ORDER — GLIPIZIDE 2.5 MG HALF TABLET
2.5000 mg | ORAL_TABLET | Freq: Two times a day (BID) | ORAL | Status: DC
  Filled 2014-03-20 (×3): qty 1

## 2014-03-20 MED ORDER — BUPRENORPHINE HCL-NALOXONE HCL 8-2 MG SL SUBL: 1.0000 | SUBLINGUAL_TABLET | Freq: Two times a day (BID) | SUBLINGUAL | Status: DC

## 2014-03-20 MED ORDER — DEXTROSE 5 % IV SOLN
500.0000 mg | Freq: Once | INTRAVENOUS | Status: AC
  Administered 2014-03-20: 500 mg via INTRAVENOUS
  Filled 2014-03-20: qty 500

## 2014-03-20 MED ORDER — INFLUENZA VAC SPLIT QUAD 0.5 ML IM SUSY
0.5000 mL | PREFILLED_SYRINGE | INTRAMUSCULAR | Status: AC
  Administered 2014-03-23: 0.5 mL via INTRAMUSCULAR
  Filled 2014-03-20 (×2): qty 0.5

## 2014-03-20 MED ORDER — SODIUM CHLORIDE 0.9 % IV SOLN
INTRAVENOUS | Status: DC
  Administered 2014-03-20: 5 [IU]/h via INTRAVENOUS
  Filled 2014-03-20: qty 2.5

## 2014-03-20 MED ORDER — ALBUTEROL SULFATE (2.5 MG/3ML) 0.083% IN NEBU: 3.0000 mL | INHALATION_SOLUTION | RESPIRATORY_TRACT | Status: DC | PRN

## 2014-03-20 MED ORDER — DEXTROSE 5 % IV SOLN
1.0000 g | Freq: Once | INTRAVENOUS | Status: AC
  Administered 2014-03-20: 1 g via INTRAVENOUS
  Filled 2014-03-20: qty 10

## 2014-03-20 MED ORDER — INSULIN GLARGINE 100 UNIT/ML ~~LOC~~ SOLN
10.0000 [IU] | Freq: Every day | SUBCUTANEOUS | Status: DC
  Administered 2014-03-20 – 2014-03-21 (×2): 10 [IU] via SUBCUTANEOUS
  Filled 2014-03-20 (×2): qty 0.1

## 2014-03-20 MED ORDER — BUPRENORPHINE HCL 2 MG SL SUBL: 8.0000 mg | SUBLINGUAL_TABLET | Freq: Two times a day (BID) | SUBLINGUAL | Status: DC

## 2014-03-20 MED ORDER — IPRATROPIUM BROMIDE 0.02 % IN SOLN
0.5000 mg | Freq: Once | RESPIRATORY_TRACT | Status: AC
  Administered 2014-03-20: 0.5 mg via RESPIRATORY_TRACT
  Filled 2014-03-20: qty 2.5

## 2014-03-20 MED ORDER — SODIUM CHLORIDE 0.9 % IV SOLN: INTRAVENOUS | Status: DC

## 2014-03-20 MED ORDER — SODIUM CHLORIDE 0.9 % IV SOLN
INTRAVENOUS | Status: DC
  Administered 2014-03-20 – 2014-03-23 (×7): via INTRAVENOUS
  Filled 2014-03-20 (×11): qty 1000

## 2014-03-20 NOTE — ED Provider Notes (Signed)
CSN: [id number]     Arrival date & time 03/20/14  0120 History   First MD Initiated Contact with Patient 03/20/14 0126     Chief Complaint  Patient presents with  . Shortness of Breath     (Consider location/radiation/quality/duration/timing/severity/associated sxs/prior Treatment) HPI Comments: 59 year old female with past medical history of hypertension, diabetes, COPD and depression presenting via EMS complaining of shortness of breath 3 weeks, worsening over the past few days. Shortness of breath worse on exertion. Admits to associated productive cough with green phlegm. Initially she thought she had a viral upper respiratory illness, however it was not getting better on its own. She does not have a PCP at this time. Admits to associated fever, temperature earlier this evening was 103. She took "3 of the big Tylenol" earlier this evening. Admits to associated nausea without vomiting. She is complaining of generalized weakness. On EMS arrival, O2 sat 85% on room air. She was then placed on 4 L of oxygen. Patient states in the past she has been on oxygen at home, however since she has not seen a PCP in one year she is no longer on oxygen.  Patient is a 59 y.o. female presenting with shortness of breath. The history is provided by the patient and the EMS personnel.  Shortness of Breath Associated symptoms: cough and fever     Past Medical History  Diagnosis Date  . Hypertension   . Diabetes mellitus without complication   . COPD (chronic obstructive pulmonary disease)   . Shortness of breath   . Depression   . Arthritis    Past Surgical History  Procedure Laterality Date  . Cholecystectomy    . Tubal ligation    . Carpel tunnel    . Breast surgery      LUMPECTOMY    Family History  Problem Relation Age of Onset  . Adopted: Yes   History  Substance Use Topics  . Smoking status: Current Every Day Smoker -- 1.00 packs/day for 40 years    Types: Cigarettes  . Smokeless  tobacco: Never Used  . Alcohol Use: No   OB History    No data available     Review of Systems  Constitutional: Positive for fever.  Respiratory: Positive for cough and shortness of breath.   Gastrointestinal: Positive for nausea.  Neurological: Positive for weakness.  All other systems reviewed and are negative.     Allergies  Ace inhibitors  Home Medications   Prior to Admission medications   Medication Sig Start Date End Date Taking? Authorizing Provider  albuterol (PROVENTIL HFA;VENTOLIN HFA) 108 (90 BASE) MCG/ACT inhaler Inhale 2 puffs into the lungs every 4 (four) hours as needed for wheezing or shortness of breath. 08/10/12  Yes Hosie Poisson, MD  amLODipine (NORVASC) 10 MG tablet Take 1 tablet (10 mg total) by mouth daily. 08/05/13  Yes Ejiroghene E Emokpae, MD  buprenorphine-naloxone (SUBOXONE) 8-2 MG SUBL SL tablet Place 1 tablet under the tongue 2 (two) times daily.   Yes Historical Provider, MD  cyclobenzaprine (FLEXERIL) 10 MG tablet Take 10 mg by mouth at bedtime as needed for muscle spasms.   Yes Historical Provider, MD  glipiZIDE (GLUCOTROL) 5 MG tablet Take 2.5 mg by mouth 2 (two) times daily.   Yes Historical Provider, MD  ibuprofen (ADVIL,MOTRIN) 200 MG tablet Take 400-600 mg by mouth every 6 (six) hours as needed for fever, headache or moderate pain.    Yes Historical Provider, MD  nystatin (MYCOSTATIN) 100000 UNIT/ML  suspension Take 5 mLs (500,000 Units total) by mouth 4 (four) times daily. 07/29/13  Yes Ernestina Patches, MD  pregabalin (LYRICA) 75 MG capsule Take 75 mg by mouth 4 (four) times daily.   Yes Historical Provider, MD  metFORMIN (GLUCOPHAGE) 500 MG tablet Take 1 tablet (500 mg total) by mouth 2 (two) times daily with a meal. Patient not taking: Reported on 03/20/2014 08/05/13   Ejiroghene Arlyce Dice, MD  methadone (DOLOPHINE) 10 MG tablet Take 40 mg by mouth every 8 (eight) hours.     Historical Provider, MD  pregabalin (LYRICA) 50 MG capsule Take by mouth  every 8 (eight) hours. 0600, 1400, 2200    Historical Provider, MD  sulfamethoxazole-trimethoprim (BACTRIM DS) 800-160 MG per tablet Take 1 tablet by mouth 2 (two) times daily. Patient not taking: Reported on 03/20/2014 08/05/13   Ejiroghene E Emokpae, MD   BP 135/69 mmHg  Pulse 117  Temp(Src) 99.6 F (37.6 C) (Oral)  Resp 19  Ht 5\' 6"  (1.676 m)  Wt 275 lb (124.739 kg)  BMI 44.41 kg/m2  SpO2 95% Physical Exam  Constitutional: She is oriented to person, place, and time. She appears well-developed and well-nourished. No distress.  Unkempt.  HENT:  Head: Normocephalic and atraumatic.  Mouth/Throat: Oropharynx is clear and moist.  Eyes: Conjunctivae and EOM are normal.  Neck: Normal range of motion. Neck supple.  Cardiovascular: Regular rhythm and normal heart sounds.   Tachycardic.  Pulmonary/Chest: Breath sounds normal. No accessory muscle usage. Tachypnea noted. She is in respiratory distress (mild).  Diffuse expiratory wheezes. Few scattered ronchi.  Musculoskeletal: Normal range of motion. She exhibits no edema.  Neurological: She is alert and oriented to person, place, and time. No sensory deficit.  Skin: Skin is warm.  Clammy.  Psychiatric: She has a normal mood and affect. Her behavior is normal.  Nursing note and vitals reviewed.   ED Course  Procedures (including critical care time) Labs Review Labs Reviewed  CBC WITH DIFFERENTIAL/PLATELET - Abnormal; Notable for the following:    WBC 14.9 (*)    Neutrophils Relative % 88 (*)    Neutro Abs 13.1 (*)    Lymphocytes Relative 6 (*)    All other components within normal limits  COMPREHENSIVE METABOLIC PANEL - Abnormal; Notable for the following:    Sodium 127 (*)    Chloride 93 (*)    Glucose, Bld 465 (*)    Creatinine, Ser 1.41 (*)    Albumin 2.6 (*)    Alkaline Phosphatase 122 (*)    GFR calc non Af Amer 41 (*)    GFR calc Af Amer 47 (*)    All other components within normal limits  URINALYSIS, ROUTINE W REFLEX  MICROSCOPIC - Abnormal; Notable for the following:    APPearance CLOUDY (*)    Specific Gravity, Urine 1.036 (*)    Glucose, UA >1000 (*)    Hgb urine dipstick MODERATE (*)    Ketones, ur 15 (*)    Protein, ur 100 (*)    Leukocytes, UA SMALL (*)    All other components within normal limits  BRAIN NATRIURETIC PEPTIDE - Abnormal; Notable for the following:    B Natriuretic Peptide 120.4 (*)    All other components within normal limits  URINE MICROSCOPIC-ADD ON - Abnormal; Notable for the following:    Squamous Epithelial / LPF FEW (*)    Bacteria, UA FEW (*)    All other components within normal limits  CBG MONITORING, ED - Abnormal;  Notable for the following:    Glucose-Capillary 561 (*)    All other components within normal limits  CULTURE, BLOOD (ROUTINE X 2)  CULTURE, BLOOD (ROUTINE X 2)  URINE CULTURE  I-STAT CG4 LACTIC ACID, ED  Randolm Idol, ED    Imaging Review Dg Chest Port 1 View  03/20/2014   CLINICAL DATA:  Subacute onset of shortness of breath for 2 weeks. Cough, dizziness, congestion and confusion. Generalized weakness. Initial encounter.  EXAM: PORTABLE CHEST - 1 VIEW  COMPARISON:  Chest radiograph performed 08/03/2013  FINDINGS: The lungs are well-aerated. Minimal left basilar opacity likely reflects atelectasis. There is no evidence of pleural effusion or pneumothorax.  The cardiomediastinal silhouette is within normal limits. No acute osseous abnormalities are seen.  IMPRESSION: Minimal left basilar opacity likely reflects atelectasis; lungs otherwise clear.   Electronically Signed   By: Garald Balding M.D.   On: 03/20/2014 02:10     EKG Interpretation None      MDM   Final diagnoses:  CAP (community acquired pneumonia)  Sepsis due to pneumonia  Urinary tract infection, bacterial  Acute renal failure, unspecified acute renal failure type   Patient presenting with fever, cough and shortness of breath. Temperature 102.9, tachycardic 117 on arrival with O2  sat 85% on room air. O2 sat increased to 96% with 2 L of oxygen. She meets SIRS criteria, most likely sepsis from pneumonia. Level II code sepsis. Antibiotics for community-acquired pneumonia started. Chest x-ray showing minimal left basilar opacity likely reflecting atelectasis, however patient clinically has pneumonia. She is also hyperglycemic, will start insulin infusion. Patient will be admitted to telemetry bed. Admission accepted by Dr. Hal Hope, [facility].  Discussed with attending Dr. Claudine Mouton who also evaluated patient and agrees with plan of care.   Carman Ching, PA-C 03/20/14 0940  Everlene Balls, MD 03/20/14 [phone]

## 2014-03-20 NOTE — Progress Notes (Signed)
Utilization review completed.  

## 2014-03-20 NOTE — ED Notes (Signed)
Pt given water instead of ginger ale; cbg 561

## 2014-03-20 NOTE — ED Notes (Signed)
Pt c/o increased shortness of breath for x 2 weeks. Reports having to walk through the snow to get to the ambulance. SpO2 85% on room air. Sats at 94% on 2L [place]. Pt reports having a URI x 2 weeks; has not seen a doctor since last admission in June 2015. Pt also c/o generalized weakness and confusion. Hx of UTIs.

## 2014-03-20 NOTE — ED Notes (Signed)
Pt given ginger ale.

## 2014-03-20 NOTE — Progress Notes (Signed)
PROGRESS NOTE  [name] TKP:[id number] DOB: 01-Nov-1955 DOA: 03/20/2014 PCP: No PCP Per Patient  Assessment/Plan: Sepsis -present at the time of admission -secondary to pneumonia and possible UTI -continue IV abx -continue IVF -Influenza PCR -f/u HIV CAP -Continue intravenous ceftriaxone and azithromycin -urine strep pneumo antigen negative -urine Legionella--pending Pyuria -continue ceftriaxone pending culture data AKI -Secondary to volume depletion -Repeat BMP -Baseline creatinine 0.7-1.0 Diabetes mellitus type 2 -Has not taken any medications in 4 months -Hemoglobin A1c -NovoLog sliding scale -recheck BMP, if no anion gap--d/c IV insulin Hypertension -Has been unable to afford her medications x 4 months -BP stable off of anti-HTN--monitor COPD -continues to smoke -tobacco cessation discussed Chronic pain syndrome -pt follows Dr. London Pepper -continue lyrica and Suboxone Diabetic dermopathy   Family Communication:   Pt at beside Disposition Plan:   Home when medically stable       Procedures/Studies: Dg Chest Port 1 View  03/20/2014   CLINICAL DATA:  Subacute onset of shortness of breath for 2 weeks. Cough, dizziness, congestion and confusion. Generalized weakness. Initial encounter.  EXAM: PORTABLE CHEST - 1 VIEW  COMPARISON:  Chest radiograph performed 08/03/2013  FINDINGS: The lungs are well-aerated. Minimal left basilar opacity likely reflects atelectasis. There is no evidence of pleural effusion or pneumothorax.  The cardiomediastinal silhouette is within normal limits. No acute osseous abnormalities are seen.  IMPRESSION: Minimal left basilar opacity likely reflects atelectasis; lungs otherwise clear.   Electronically Signed   By: Garald Balding M.D.   On: 03/20/2014 02:10         Subjective: Patient states that she is breathing somewhat better. Denies any fevers, chills, chest pain, vomiting, diarrhea, abdominal pain, dysuria. No headaches or  rashes.  Objective: Filed Vitals:   03/20/14 0330 03/20/14 0331 03/20/14 0415 03/20/14 0442  BP: 139/77  136/71 137/79  Pulse: 103  103 108  Temp:  99.6 F (37.6 C)  98.7 F (37.1 C)  TempSrc:  Oral  Oral  Resp: 25  15 18   Height:   5\' 6"  (1.676 m)   Weight:   138.4 kg (305 lb 1.9 oz)   SpO2: 99%  96%     Intake/Output Summary (Last 24 hours) at 03/20/14 5170 Last data filed at 03/20/14 0900  Gross per 24 hour  Intake      0 ml  Output    500 ml  Net   -500 ml   Weight change:  Exam:   General:  Pt is alert, follows commands appropriately, not in acute distress  HEENT: No icterus, No thrush, [place]/AT  Cardiovascular: RRR, S1/S2, no rubs, no gallops  Respiratory: Bibasilar crackles, left greater than right. No wheezing.  Abdomen: Soft/+BS, non tender, non distended, no guarding  Extremities: 1+LE edema, No lymphangitis, No petechiae, No rashes, no synovitis  Data Reviewed: Basic Metabolic Panel:  Recent Labs Lab 03/20/14 0138  NA 127*  K 3.7  CL 93*  CO2 23  GLUCOSE 465*  BUN 11  CREATININE 1.41*  CALCIUM 8.6   Liver Function Tests:  Recent Labs Lab 03/20/14 0138  AST 13  ALT 15  ALKPHOS 122*  BILITOT 0.6  PROT 7.2  ALBUMIN 2.6*   No results for input(s): LIPASE, AMYLASE in the last 168 hours. No results for input(s): AMMONIA in the last 168 hours. CBC:  Recent Labs Lab 03/20/14 0138  WBC 14.9*  NEUTROABS 13.1*  HGB 13.3  HCT 37.3  MCV 80.6  PLT 340   Cardiac Enzymes:  Recent Labs Lab 03/20/14 0542  TROPONINI 0.03   BNP: Invalid input(s): POCBNP CBG:  Recent Labs Lab 03/20/14 0439 03/20/14 0553 03/20/14 0657 03/20/14 0802 03/20/14 0906  GLUCAP 527* 459* 333* 281* 297*    Recent Results (from the past 240 hour(s))  Culture, sputum-assessment     Status: None   Collection Time: 03/20/14  6:51 AM  Result Value Ref Range Status   Specimen Description SPUTUM  Final   Special Requests NONE  Final   Sputum evaluation    Final    MICROSCOPIC FINDINGS SUGGEST THAT THIS SPECIMEN IS NOT REPRESENTATIVE OF LOWER RESPIRATORY SECRETIONS. PLEASE RECOLLECT. RESULT CALLED TO, READ BACK BY AND VERIFIED WITH: Theotis Barrio 03/20/14 AT 0904 BY RHOLMES   Report Status 03/20/2014 FINAL  Final     Scheduled Meds: . azithromycin  500 mg Intravenous Q24H  . buprenorphine  8 mg Sublingual BID  . cefTRIAXone (ROCEPHIN)  IV  1 g Intravenous Q24H  . enoxaparin (LOVENOX) injection  40 mg Subcutaneous Q24H  . glipiZIDE  2.5 mg Oral BID WC  . [START ON 03/21/2014] Influenza vac split quadrivalent PF  0.5 mL Intramuscular Tomorrow-1000  . [START ON 03/21/2014] pneumococcal 23 valent vaccine  0.5 mL Intramuscular Tomorrow-1000  . pregabalin  75 mg Oral QID  . sodium chloride  3 mL Intravenous Q12H   Continuous Infusions: . sodium chloride    . insulin (NOVOLIN-R) infusion 8.2 Units/hr (03/20/14 0700)     Jeryn Bertoni, DO  [facility] Pager [phone]  If 7PM-7AM, please contact night-coverage [email or web address] Password [email or web address] 03/20/2014, 9:29 AM   LOS: 0 days

## 2014-03-20 NOTE — ED Notes (Signed)
Dr.Kakrakandy at bedside

## 2014-03-20 NOTE — H&P (Addendum)
[facility] History and Physical  [name] JOA:[id number] DOB: [birth year] DOA: 03/20/2014  Referring physician: ER physician. PCP: No PCP Per Patient   Chief Complaint: Shortness of breath.  HPI: [name] is a 59 y.o. female with history of diabetes mellitus type 2 COPD and hypertension to be not taking any medications specifically ER because of worsening shortness of breath productive cough fever and chills over the last 2 weeks. Patient states she has been having productive cough and shortness of breath which started 2 weeks ago and gradually improved but started becoming worse again last 2 days. She also has some discolored urine. Denies any nausea vomiting abdominal pain or diarrhea. In the ER patient was found to be febrile with temperature around 102F and tachycardic. Patient which was elevated initially to 460 5R was found to be around 565. Patient states she has not been taking her medications for last few months because she was not able to afford it. Patient has been admitted for sepsis probably from pneumonia and also possible UTI.   Review of Systems: As presented in the history of presenting illness, rest negative.  Past Medical History  Diagnosis Date  . Hypertension   . Diabetes mellitus without complication   . COPD (chronic obstructive pulmonary disease)   . Shortness of breath   . Depression   . Arthritis    Past Surgical History  Procedure Laterality Date  . Cholecystectomy    . Tubal ligation    . Carpel tunnel    . Breast surgery      LUMPECTOMY    Social History:  reports that she has been smoking Cigarettes.  She has a 40 pack-year smoking history. She has never used smokeless tobacco. She reports that she does not drink alcohol or use illicit drugs. Where does patient live at home. Can patient participate in ADLs? Yes.  Allergies  Allergen Reactions  . Ace Inhibitors Anaphylaxis    Family History:  Family History  Problem Relation Age of  Onset  . Adopted: Yes      Prior to Admission medications   Medication Sig Start Date End Date Taking? Authorizing Provider  albuterol (PROVENTIL HFA;VENTOLIN HFA) 108 (90 BASE) MCG/ACT inhaler Inhale 2 puffs into the lungs every 4 (four) hours as needed for wheezing or shortness of breath. 08/10/12  Yes Hosie Poisson, MD  amLODipine (NORVASC) 10 MG tablet Take 1 tablet (10 mg total) by mouth daily. 08/05/13  Yes Ejiroghene E Emokpae, MD  buprenorphine-naloxone (SUBOXONE) 8-2 MG SUBL SL tablet Place 1 tablet under the tongue 2 (two) times daily.   Yes Historical Provider, MD  cyclobenzaprine (FLEXERIL) 10 MG tablet Take 10 mg by mouth at bedtime as needed for muscle spasms.   Yes Historical Provider, MD  glipiZIDE (GLUCOTROL) 5 MG tablet Take 2.5 mg by mouth 2 (two) times daily.   Yes Historical Provider, MD  ibuprofen (ADVIL,MOTRIN) 200 MG tablet Take 400-600 mg by mouth every 6 (six) hours as needed for fever, headache or moderate pain.    Yes Historical Provider, MD  nystatin (MYCOSTATIN) 100000 UNIT/ML suspension Take 5 mLs (500,000 Units total) by mouth 4 (four) times daily. 07/29/13  Yes Ernestina Patches, MD  pregabalin (LYRICA) 75 MG capsule Take 75 mg by mouth 4 (four) times daily.   Yes Historical Provider, MD  metFORMIN (GLUCOPHAGE) 500 MG tablet Take 1 tablet (500 mg total) by mouth 2 (two) times daily with a meal. Patient not taking: Reported on 03/20/2014 08/05/13  Ejiroghene Arlyce Dice, MD  methadone (DOLOPHINE) 10 MG tablet Take 40 mg by mouth every 8 (eight) hours.     Historical Provider, MD  pregabalin (LYRICA) 50 MG capsule Take by mouth every 8 (eight) hours. 0600, 1400, 2200    Historical Provider, MD  sulfamethoxazole-trimethoprim (BACTRIM DS) 800-160 MG per tablet Take 1 tablet by mouth 2 (two) times daily. Patient not taking: Reported on 03/20/2014 08/05/13   Bethena Roys, MD    Physical Exam: Filed Vitals:   03/20/14 0215 03/20/14 0245 03/20/14 0330 03/20/14 0331  BP:  140/78 135/69 139/77   Pulse: 114 117 103   Temp:    99.6 F (37.6 C)  TempSrc:    Oral  Resp: 20 19 25    Height:      Weight:      SpO2: 95% 95% 99%      General:  Moderately built and nourished.  Eyes: Anicteric no pallor.  ENT: No discharge from the ears eyes nose or mouth.  Neck: No mass felt. JVD not appreciated.  Cardiovascular: S1-S2 heard tachycardic.  Respiratory: No rhonchi or crepitations.  Abdomen: Soft nontender bowel sounds present.  Skin: Chronic bilateral lower extremity rash.  Musculoskeletal: No edema.  Psychiatric: Appears normal.  Neurologic: Alert awake oriented to time place and person. Moves all extremities.  Labs on Admission:  Basic Metabolic Panel:  Recent Labs Lab 03/20/14 0138  NA 127*  K 3.7  CL 93*  CO2 23  GLUCOSE 465*  BUN 11  CREATININE 1.41*  CALCIUM 8.6   Liver Function Tests:  Recent Labs Lab 03/20/14 0138  AST 13  ALT 15  ALKPHOS 122*  BILITOT 0.6  PROT 7.2  ALBUMIN 2.6*   No results for input(s): LIPASE, AMYLASE in the last 168 hours. No results for input(s): AMMONIA in the last 168 hours. CBC:  Recent Labs Lab 03/20/14 0138  WBC 14.9*  NEUTROABS 13.1*  HGB 13.3  HCT 37.3  MCV 80.6  PLT 340   Cardiac Enzymes: No results for input(s): CKTOTAL, CKMB, CKMBINDEX, TROPONINI in the last 168 hours.  BNP (last 3 results) No results for input(s): PROBNP in the last 8760 hours. CBG:  Recent Labs Lab 03/20/14 0328  GLUCAP 561*    Radiological Exams on Admission: Dg Chest Port 1 View  03/20/2014   CLINICAL DATA:  Subacute onset of shortness of breath for 2 weeks. Cough, dizziness, congestion and confusion. Generalized weakness. Initial encounter.  EXAM: PORTABLE CHEST - 1 VIEW  COMPARISON:  Chest radiograph performed 08/03/2013  FINDINGS: The lungs are well-aerated. Minimal left basilar opacity likely reflects atelectasis. There is no evidence of pleural effusion or pneumothorax.  The cardiomediastinal  silhouette is within normal limits. No acute osseous abnormalities are seen.  IMPRESSION: Minimal left basilar opacity likely reflects atelectasis; lungs otherwise clear.   Electronically Signed   By: Garald Balding M.D.   On: 03/20/2014 02:10   EKG - sinus tachycardia.  Assessment/Plan Active Problems:   Sepsis   COPD (chronic obstructive pulmonary disease)   Community acquired pneumonia   Diabetes mellitus type 2, uncontrolled   1. Sepsis - probably from pneumonia and also possible UTI. Patient has been placed on ceftriaxone and Zithromax to treat for community-acquired pneumonia. Check HIV status, urine Legionella and strep antigen and also check influenza PCR. Follow cultures. 2. Community-acquired pneumonia - chest x-ray only shows atelectasis but given patient's symptoms we are treating for community-acquired pneumonia. See #1. 3. Possible UTI - follow cultures patient  is on antibiotics. 4. Uncontrolled diabetes mellitus type 2 - patient was not able to afford her medications and has not been taking her medications for many months now. Check hemoglobin A1c. Patient has been started on Glucomandder and can be slowly transitioned to oral medications and sliding scale coverage once blood sugars controlled. Follow metabolic panel closely. 5. Acute renal failure - probably from dehydration. Closely follow intake output and metabolic panel. 6. COPD - presently not wheezing. 7. Hypertension - for now I have placed patient on when necessary IV hydralazine and may start amlodipine based on patient's blood pressure trends. 8. Chronic lower extremity skin changes. 9. History of chronic pain.  DVT Prophylaxis Lovenox.  Code Status: Full code.  Family Communication: None.  Disposition Plan: Admit to inpatient.    Zeriah Baysinger N. [facility] Pager [phone].  If 7PM-7AM, please contact night-coverage [email or web address] Password [email or web address] 03/20/2014, 4:05 AM

## 2014-03-20 NOTE — ED Notes (Signed)
Called to patient's room; reports R arm feels "wet." Pt accidentally removed IV, catheter intact, IV restarted above previous site.

## 2014-03-20 NOTE — ED Notes (Signed)
Pt to ED via [facility] c/o increased shortness of breath with productive cough. Initial sats were 85% on room air. EMS placed pt on 4L [place] sats increased to 95%. Pt also c/o generalized weakness. Recently diagnosed with URI

## 2014-03-21 ENCOUNTER — Inpatient Hospital Stay (HOSPITAL_COMMUNITY): Payer: Medicaid Other

## 2014-03-21 ENCOUNTER — Encounter (HOSPITAL_COMMUNITY): Payer: Self-pay

## 2014-03-21 DIAGNOSIS — A499 Bacterial infection, unspecified: Secondary | ICD-10-CM

## 2014-03-21 DIAGNOSIS — N390 Urinary tract infection, site not specified: Secondary | ICD-10-CM

## 2014-03-21 LAB — GLUCOSE, CAPILLARY
GLUCOSE-CAPILLARY: 389 mg/dL — AB (ref 70–99)
Glucose-Capillary: 374 mg/dL — ABNORMAL HIGH (ref 70–99)
Glucose-Capillary: 289 mg/dL — ABNORMAL HIGH (ref 70–99)
Glucose-Capillary: 306 mg/dL — ABNORMAL HIGH (ref 70–99)

## 2014-03-21 LAB — BASIC METABOLIC PANEL
Anion gap: 7 (ref 5–15)
BUN: 16 mg/dL (ref 6–23)
CALCIUM: 8.1 mg/dL — AB (ref 8.4–10.5)
CHLORIDE: 98 mmol/L (ref 96–112)
CO2: 27 mmol/L (ref 19–32)
Creatinine, Ser: 1.55 mg/dL — ABNORMAL HIGH (ref 0.50–1.10)
GFR calc non Af Amer: 36 mL/min — ABNORMAL LOW (ref >90)
GFR, EST AFRICAN AMERICAN: 42 mL/min — AB (ref >90)
GLUCOSE: 387 mg/dL — AB (ref 70–99)
Potassium: 3.9 mmol/L (ref 3.5–5.1)
SODIUM: 132 mmol/L — AB (ref 135–145)

## 2014-03-21 LAB — CBC
HCT: 36.1 % (ref 36.0–46.0)
Hemoglobin: 12.2 g/dL (ref 12.0–15.0)
MCH: 28 pg (ref 26.0–34.0)
MCHC: 33.8 g/dL (ref 30.0–36.0)
MCV: 83 fL (ref 78.0–100.0)
Platelets: 320 10*3/uL (ref 150–400)
RBC: 4.35 MIL/uL (ref 3.87–5.11)
RDW: 12.9 % (ref 11.5–15.5)
WBC: 11.7 10*3/uL — ABNORMAL HIGH (ref 4.0–10.5)

## 2014-03-21 LAB — HIV ANTIBODY (ROUTINE TESTING W REFLEX)
HIV 1/O/2 Abs-Index Value: <1 (ref <1.00)
HIV-1/HIV-2 Ab: NONREACTIVE

## 2014-03-21 MED ORDER — SODIUM CHLORIDE 0.9 % IV BOLUS (SEPSIS)
500.0000 mL | Freq: Once | INTRAVENOUS | Status: AC
  Administered 2014-03-21: 500 mL via INTRAVENOUS

## 2014-03-21 MED ORDER — INSULIN GLARGINE 100 UNIT/ML ~~LOC~~ SOLN
25.0000 [IU] | Freq: Every day | SUBCUTANEOUS | Status: DC
  Administered 2014-03-22 – 2014-03-23 (×2): 25 [IU] via SUBCUTANEOUS
  Filled 2014-03-21 (×2): qty 0.25

## 2014-03-21 MED ORDER — INSULIN GLARGINE 100 UNIT/ML ~~LOC~~ SOLN
15.0000 [IU] | Freq: Once | SUBCUTANEOUS | Status: AC
  Administered 2014-03-21: 15 [IU] via SUBCUTANEOUS
  Filled 2014-03-21: qty 0.15

## 2014-03-21 MED ORDER — FLUCONAZOLE 150 MG PO TABS
150.0000 mg | ORAL_TABLET | Freq: Once | ORAL | Status: AC
Start: 2014-03-21 — End: 2014-03-21
  Administered 2014-03-21: 150 mg via ORAL
  Filled 2014-03-21: qty 1

## 2014-03-21 MED ORDER — IOHEXOL 300 MG/ML  SOLN
25.0000 mL | INTRAMUSCULAR | Status: AC
  Administered 2014-03-21 (×2): 25 mL via ORAL

## 2014-03-21 MED ORDER — INSULIN ASPART 100 UNIT/ML ~~LOC~~ SOLN
4.0000 [IU] | Freq: Three times a day (TID) | SUBCUTANEOUS | Status: DC
  Administered 2014-03-22 – 2014-03-24 (×6): 4 [IU] via SUBCUTANEOUS

## 2014-03-21 NOTE — Progress Notes (Signed)
Pt had a temp of 101.3, at 0110, earlier had tab tylenol 650mg  at midnight, NP k. Schorr (on call) paged and notified, ordered a bolus of 500cc of iv NS same given at 0135,ice pack placed in between pt's underarm and pt reassured, will continue to monitor. [place]-Asidi, [name]

## 2014-03-21 NOTE — Progress Notes (Addendum)
Inpatient Diabetes Program Recommendations  AACE/ADA: New Consensus Statement on Inpatient Glycemic Control (2013)  Target Ranges:  Prepandial:   less than 140 mg/dL      Peak postprandial:   less than 180 mg/dL (1-2 hours)      Critically ill patients:  140 - 180 mg/dL     Results for [name], [name] (MRN [id number]) as of 03/21/2014 09:26  Ref. Range 03/20/2014 01:38  Glucose Latest Range: 70-99 mg/dL 465 (H)    Results for [name], [name] (MRN [id number]) as of 03/21/2014 09:26  Ref. Range 03/21/2014 06:01  Glucose-Capillary Latest Range: 70-99 mg/dL 389 (H)     Chief Complaint: Shortness of breath.  HPI: 59 y.o. female with history of diabetes mellitus type 2 COPD and hypertension.   Patient states she has been having productive cough and shortness of breath which started 2 weeks ago and gradually improved but started becoming worse again last 2 days.  Patient states she has not been taking her medications for last few months because she was not able to afford it.  Patient has been admitted for sepsis probably from pneumonia and also possible UTI.     Home DM Meds: Metformin 500 mg bid  (patient not taking due to inability to afford)       Glipizide 2.5 mg bid  (patient not taking due to inability to afford)   Current Orders: Lantus 10 units daily      Novolog Moderate SSI tid ac + HS   **Note patient was given Lantus 10 units yesterday at 5pm to transition off IV insulin drip.  Novolog SSI started at 9pm.    **Note patient has Medicaid coverage but does not have PCP.  Will place care management consult.  **I am surprised patient cannot afford her DM medications.  Patient has Medicaid which provides most Rxs for $3 a piece.     MD- Please consider the following:  1. Increase Lantus to 28 units daily (0.2 units/kg dosing based on weight of 138 kg) 2. Add Novolog Meal Coverage- Novolog 4 units tid with meals (patient eating 100% of meals) 3. Please check current A1c  level 4. MD- Do you anticipate patient needing insulin at home?  If so, patient will need insulin education to be initiated by bedside RN     Will follow Wyn Quaker RN, MSN, CDE Diabetes Coordinator Inpatient Diabetes Program Team Pager: [phone] (8a-10p)

## 2014-03-21 NOTE — Progress Notes (Signed)
Pt flu vaccine held today and time changed to tomorrow d/t pt having temps overnight. Delia Heady RN

## 2014-03-21 NOTE — Progress Notes (Signed)
PROGRESS NOTE  [name] ZOX:[id number] DOB: [birth year] DOA: 03/20/2014 PCP: No PCP Per Patient  Assessment/Plan: Sepsis -present at the time of admission -secondary to pneumonia and UTI -continue IV abx -continue IVF -Influenza PCR -f/u HIV--neg -Patient with persistent fever, but trending down -WBC trending down CAP -Continue intravenous ceftriaxone and azithromycin -urine strep pneumo antigen negative -urine Legionella--pending Pyuria/UTI -continue ceftriaxone pending culture data Abdominal pain -In the setting of UTI, persistent fever, CT abdomen and pelvis AKI -Secondary to volume depletion -Renal ultrasound -Baseline creatinine 0.7-1.0 Diabetes mellitus type 2 -Has not taken any medications in 4 months -Hemoglobin A1c -NovoLog sliding scale -Increase Lantus to 25 units daily -NovoLog 4 units with meals Hypertension -Has been unable to afford her medications x 4 months -BP stable off of anti-HTN--monitor COPD -continues to smoke -tobacco cessation discussed Chronic pain syndrome -pt follows Dr. London Pepper -continue lyrica and Suboxone Diabetic dermopathy  Family Communication:   Pt at beside Disposition Plan:   Home when medically stable       Procedures/Studies: Dg Chest Port 1 View  03/20/2014   CLINICAL DATA:  Subacute onset of shortness of breath for 2 weeks. Cough, dizziness, congestion and confusion. Generalized weakness. Initial encounter.  EXAM: PORTABLE CHEST - 1 VIEW  COMPARISON:  Chest radiograph performed 08/03/2013  FINDINGS: The lungs are well-aerated. Minimal left basilar opacity likely reflects atelectasis. There is no evidence of pleural effusion or pneumothorax.  The cardiomediastinal silhouette is within normal limits. No acute osseous abnormalities are seen.  IMPRESSION: Minimal left basilar opacity likely reflects atelectasis; lungs otherwise clear.   Electronically Signed   By: Garald Balding M.D.   On: 03/20/2014 02:10          Subjective: Patient states that she is breathing better but she still has a nonproductive cough. She denies any chest pain, nausea, vomiting, diarrhea. She complains of abdominal pain in the bilateral lower quadrants. No hematochezia or melena.  Objective: Filed Vitals:   03/21/14 0110 03/21/14 0340 03/21/14 0459 03/21/14 0554  BP:    140/74  Pulse:    119  Temp: 101.3 F (38.5 C) 98.7 F (37.1 C) 101 F (38.3 C) 99.2 F (37.3 C)  TempSrc: Oral Oral Oral Oral  Resp:    16  Height:      Weight:      SpO2:    94%    Intake/Output Summary (Last 24 hours) at 03/21/14 1624 Last data filed at 03/21/14 1100  Gross per 24 hour  Intake    480 ml  Output   2950 ml  Net  -2470 ml   Weight change:  Exam:   General:  Pt is alert, follows commands appropriately, not in acute distress  HEENT: No icterus, No thrush,[place]/AT  Cardiovascular: RRR, S1/S2, no rubs, no gallops  Respiratory: Bibasilar rales, left greater than right. No wheezing  Abdomen: Soft/+BS, LLQ, RLQ tender without rebound, non distended, no guarding  Extremities: 1+LE edema, No lymphangitis, No petechiae, No rashes, no synovitis  Data Reviewed: Basic Metabolic Panel:  Recent Labs Lab 03/20/14 0138 03/20/14 1133 03/21/14 0535  NA 127* 131* 132*  K 3.7 4.2 3.9  CL 93* 97 98  CO2 23 23 27   GLUCOSE 465* 183* 387*  BUN 11 14 16   CREATININE 1.41* 1.33* 1.55*  CALCIUM 8.6 8.6 8.1*   Liver Function Tests:  Recent Labs Lab 03/20/14 0138  AST 13  ALT 15  ALKPHOS 122*  BILITOT 0.6  PROT 7.2  ALBUMIN 2.6*   No results for input(s): LIPASE, AMYLASE in the last 168 hours. No results for input(s): AMMONIA in the last 168 hours. CBC:  Recent Labs Lab 03/20/14 0138 03/21/14 0535  WBC 14.9* 11.7*  NEUTROABS 13.1*  --   HGB 13.3 12.2  HCT 37.3 36.1  MCV 80.6 83.0  PLT 340 320   Cardiac Enzymes:  Recent Labs Lab 03/20/14 0542  TROPONINI 0.03   BNP: Invalid input(s):  POCBNP CBG:  Recent Labs Lab 03/20/14 1500 03/20/14 1611 03/20/14 2131 03/21/14 0601 03/21/14 1209  GLUCAP 183* 140* 306* 389* 374*    Recent Results (from the past 240 hour(s))  Blood Culture (routine x 2)     Status: None (Preliminary result)   Collection Time: 03/20/14  1:38 AM  Result Value Ref Range Status   Specimen Description BLOOD RIGHT ARM  Final   Special Requests BOTTLES DRAWN AEROBIC AND ANAEROBIC 5CC EA  Final   Culture   Final           BLOOD CULTURE RECEIVED NO GROWTH TO DATE CULTURE WILL BE HELD FOR 5 DAYS BEFORE ISSUING A FINAL NEGATIVE REPORT Performed at [facility]    Report Status PENDING  Incomplete  Blood Culture (routine x 2)     Status: None (Preliminary result)   Collection Time: 03/20/14  1:45 AM  Result Value Ref Range Status   Specimen Description BLOOD LEFT ARM  Final   Special Requests BOTTLES DRAWN AEROBIC AND ANAEROBIC 5CC EA  Final   Culture   Final           BLOOD CULTURE RECEIVED NO GROWTH TO DATE CULTURE WILL BE HELD FOR 5 DAYS BEFORE ISSUING A FINAL NEGATIVE REPORT Performed at [facility]    Report Status PENDING  Incomplete  Urine culture     Status: None (Preliminary result)   Collection Time: 03/20/14  2:37 AM  Result Value Ref Range Status   Specimen Description URINE, CATHETERIZED  Final   Special Requests NONE  Final   Colony Count   Final    70,000 COLONIES/ML Performed at [facility]    Culture   Final    GRAM NEGATIVE RODS Performed at [facility]    Report Status PENDING  Incomplete  Culture, sputum-assessment     Status: None   Collection Time: 03/20/14  6:51 AM  Result Value Ref Range Status   Specimen Description SPUTUM  Final   Special Requests NONE  Final   Sputum evaluation   Final    MICROSCOPIC FINDINGS SUGGEST THAT THIS SPECIMEN IS NOT REPRESENTATIVE OF LOWER RESPIRATORY SECRETIONS. PLEASE RECOLLECT. RESULT CALLED TO, READ BACK BY AND VERIFIED WITH: Theotis Barrio 03/20/14  AT 0904 BY RHOLMES   Report Status 03/20/2014 FINAL  Final     Scheduled Meds: . azithromycin  500 mg Intravenous Q24H  . buprenorphine  8 mg Sublingual BID  . cefTRIAXone (ROCEPHIN)  IV  1 g Intravenous Q24H  . enoxaparin (LOVENOX) injection  40 mg Subcutaneous Q24H  . Influenza vac split quadrivalent PF  0.5 mL Intramuscular Tomorrow-1000  . insulin aspart  0-15 Units Subcutaneous TID WC  . insulin aspart  0-5 Units Subcutaneous QHS  . insulin glargine  10 Units Subcutaneous Daily  . pregabalin  75 mg Oral QID  . sodium chloride  3 mL Intravenous Q12H   Continuous Infusions: . sodium chloride 0.9 % 1,000 mL with potassium chloride 20  mEq infusion 100 mL/hr at 03/21/14 1157     Jameriah Trotti, DO  [facility] Pager [phone]  If 7PM-7AM, please contact night-coverage [email or web address] Password [email or web address] 03/21/2014, 4:24 PM   LOS: 1 day

## 2014-03-21 NOTE — Progress Notes (Signed)
Nutrition Brief Note  Patient identified on the Malnutrition Screening Tool (MST) Report  Wt Readings from Last 15 Encounters:  03/20/14 305 lb 1.9 oz (138.4 kg)  08/03/13 301 lb 2.4 oz (136.6 kg)  07/29/13 280 lb (127.007 kg)  08/10/12 297 lb 9.9 oz (135 kg)  09/01/09 308 lb (139.708 kg)  08/11/09 310 lb (140.615 kg)  06/07/08 326 lb (147.873 kg)  04/15/07 321 lb (145.605 kg)    Body mass index is 49.27 kg/(m^2). Patient meets criteria for morbid obesity based on current BMI. Weight has been stable.  Current diet order is heart healthy/carbohyrate modified, patient is consuming approximately 100% of meals at this time. Pt reports having a good appetite with no difficulties. Labs and medications reviewed.   No nutrition interventions warranted at this time. If nutrition issues arise, please consult RD.   Kallie Locks, MS, RD, LDN Pager # [phone] After hours/ weekend pager # [phone]

## 2014-03-21 NOTE — Progress Notes (Signed)
Page Lamar Blinks N.P. To read [place] Results done tonight.

## 2014-03-21 NOTE — Progress Notes (Signed)
Flu pcr sent; Pt temp was 102 and pt given prn Tylenol and a bath; temp now down to 100.1. Will continue to monitor pt quietly. Delia Heady RN

## 2014-03-21 NOTE — Progress Notes (Signed)
CARE MANAGEMENT NOTE 03/21/2014  Patient:  [name], [name]   Account Number:  [id number]  Date Initiated:  03/21/2014  Documentation initiated by:  [facility]  Subjective/Objective Assessment:     Action/Plan:   Anticipated DC Date:  03/24/2014   Anticipated DC Plan:  [place]  CM consult  Medication Assistance  PCP issues      Choice offered to / List presented to:     DME arranged  Vassie Moselle      DME agency  [facility].        Status of service:  Completed, signed off Medicare Important Message given?   (If response is "NO", the following Medicare IM given date fields will be blank) Date Medicare IM given:   Medicare IM given by:   Date Additional Medicare IM given:   Additional Medicare IM given by:    Discharge Disposition:  HOME/SELF CARE  Per UR Regulation:    If discussed at Long Length of Stay Meetings, dates discussed:    Comments:  03/21/2014 1630 NCM spoke to pt and states her income is limited. Pt states she SSI check runs $500 per month. She receives her Medications for [facility] for copay price of $3.00. States she is established with [facility] Medicine at [facility] # [phone]. Scheduled appt with clinic for 04/05/2014 at 10:45 am. Pt states transportation is a problem due to their older vehicle. Explained to pt the importance of follow up with PCP. Offered application for [facility] transport and pt states she will not have ride to interview. Requested RW for home. Notified [facility] for DME for home. Jonnie Finner RN CCM Case Mgmt phone [phone]

## 2014-03-22 ENCOUNTER — Inpatient Hospital Stay (HOSPITAL_COMMUNITY): Payer: Medicaid Other

## 2014-03-22 ENCOUNTER — Encounter (HOSPITAL_COMMUNITY)
Admission: EM | Disposition: A | Discharge status: Home / Self Care | Payer: Self-pay | Source: Home / Self Care | Attending: Internal Medicine

## 2014-03-22 ENCOUNTER — Inpatient Hospital Stay (HOSPITAL_COMMUNITY): Payer: Medicaid Other | Admitting: Certified Registered Nurse Anesthetist

## 2014-03-22 ENCOUNTER — Encounter (HOSPITAL_COMMUNITY): Payer: Self-pay | Admitting: Certified Registered Nurse Anesthetist

## 2014-03-22 ENCOUNTER — Other Ambulatory Visit: Payer: Self-pay | Admitting: Urology

## 2014-03-22 DIAGNOSIS — N139 Obstructive and reflux uropathy, unspecified: Secondary | ICD-10-CM

## 2014-03-22 DIAGNOSIS — N136 Pyonephrosis: Secondary | ICD-10-CM

## 2014-03-22 HISTORY — PX: CYSTOSCOPY W/ URETERAL STENT PLACEMENT: SHX1429

## 2014-03-22 LAB — HEMOGLOBIN A1C
Hgb A1c MFr Bld: 11.1 % — ABNORMAL HIGH (ref <5.7)
MEAN PLASMA GLUCOSE: 272 mg/dL — AB (ref <117)

## 2014-03-22 LAB — BASIC METABOLIC PANEL
ANION GAP: 6 (ref 5–15)
BUN: 16 mg/dL (ref 6–23)
CHLORIDE: 100 mmol/L (ref 96–112)
CO2: 27 mmol/L (ref 19–32)
CREATININE: 1.46 mg/dL — AB (ref 0.50–1.10)
Calcium: 8.4 mg/dL (ref 8.4–10.5)
GFR calc non Af Amer: 39 mL/min — ABNORMAL LOW (ref >90)
GFR, EST AFRICAN AMERICAN: 45 mL/min — AB (ref >90)
GLUCOSE: 321 mg/dL — AB (ref 70–99)
Potassium: 4.2 mmol/L (ref 3.5–5.1)
SODIUM: 133 mmol/L — AB (ref 135–145)

## 2014-03-22 LAB — GLUCOSE, CAPILLARY
GLUCOSE-CAPILLARY: 292 mg/dL — AB (ref 70–99)
Glucose-Capillary: 261 mg/dL — ABNORMAL HIGH (ref 70–99)
Glucose-Capillary: 226 mg/dL — ABNORMAL HIGH (ref 70–99)
Glucose-Capillary: 236 mg/dL — ABNORMAL HIGH (ref 70–99)
Glucose-Capillary: 358 mg/dL — ABNORMAL HIGH (ref 70–99)

## 2014-03-22 LAB — URINE CULTURE: Colony Count: 70000

## 2014-03-22 LAB — SURGICAL PCR SCREEN
MRSA, PCR: NEGATIVE
Staphylococcus aureus: NEGATIVE

## 2014-03-22 LAB — CBC
HEMATOCRIT: 34.2 % — AB (ref 36.0–46.0)
Hemoglobin: 11.1 g/dL — ABNORMAL LOW (ref 12.0–15.0)
MCH: 27.7 pg (ref 26.0–34.0)
MCHC: 32.5 g/dL (ref 30.0–36.0)
MCV: 85.3 fL (ref 78.0–100.0)
PLATELETS: 362 10*3/uL (ref 150–400)
RBC: 4.01 MIL/uL (ref 3.87–5.11)
RDW: 13.2 % (ref 11.5–15.5)
WBC: 11.5 10*3/uL — ABNORMAL HIGH (ref 4.0–10.5)

## 2014-03-22 LAB — CK: Total CK: 22 U/L (ref 7–177)

## 2014-03-22 LAB — LEGIONELLA ANTIGEN, URINE

## 2014-03-22 LAB — INFLUENZA PANEL BY PCR (TYPE A & B)
H1N1FLUPCR: NOT DETECTED
INFLAPCR: NEGATIVE
INFLBPCR: NEGATIVE

## 2014-03-22 LAB — GRAM STAIN

## 2014-03-22 SURGERY — CYSTOSCOPY, WITH RETROGRADE PYELOGRAM AND URETERAL STENT INSERTION
Anesthesia: General | Laterality: Left

## 2014-03-22 MED ORDER — PROPOFOL 10 MG/ML IV BOLUS
INTRAVENOUS | Status: DC | PRN
  Administered 2014-03-22: 150 mg via INTRAVENOUS

## 2014-03-22 MED ORDER — MEPERIDINE HCL 25 MG/ML IJ SOLN: 6.2500 mg | INTRAMUSCULAR | Status: DC | PRN

## 2014-03-22 MED ORDER — ONDANSETRON HCL 4 MG/2ML IJ SOLN: 4.0000 mg | Freq: Once | INTRAMUSCULAR | Status: DC | PRN

## 2014-03-22 MED ORDER — STERILE WATER FOR IRRIGATION IR SOLN
Status: DC | PRN
  Administered 2014-03-22: 1000 mL via INTRAVESICAL

## 2014-03-22 MED ORDER — MIDAZOLAM HCL 5 MG/5ML IJ SOLN
INTRAMUSCULAR | Status: DC | PRN
  Administered 2014-03-22: 2 mg via INTRAVENOUS

## 2014-03-22 MED ORDER — SODIUM CHLORIDE 0.9 % IR SOLN
Status: DC | PRN
  Administered 2014-03-22: 1000 mL

## 2014-03-22 MED ORDER — LIDOCAINE HCL (CARDIAC) 20 MG/ML IV SOLN
INTRAVENOUS | Status: AC
Start: 2014-03-22 — End: 2014-03-22
  Filled 2014-03-22: qty 5

## 2014-03-22 MED ORDER — INSULIN STARTER KIT- PEN NEEDLES (ENGLISH)
1.0000 | Freq: Once | Status: AC
  Administered 2014-03-22: 1
  Filled 2014-03-22 (×2): qty 1

## 2014-03-22 MED ORDER — HYDROMORPHONE HCL 1 MG/ML IJ SOLN
0.2500 mg | INTRAMUSCULAR | Status: DC | PRN
Start: 2014-03-22 — End: 2014-03-22

## 2014-03-22 MED ORDER — PROMETHAZINE HCL 25 MG/ML IJ SOLN: 6.2500 mg | INTRAMUSCULAR | Status: DC | PRN

## 2014-03-22 MED ORDER — SODIUM CHLORIDE 0.9 % IV SOLN
INTRAVENOUS | Status: DC
  Administered 2014-03-22 (×3): via INTRAVENOUS

## 2014-03-22 MED ORDER — FENTANYL CITRATE 0.05 MG/ML IJ SOLN
INTRAMUSCULAR | Status: DC | PRN
  Administered 2014-03-22 (×2): 75 ug via INTRAVENOUS

## 2014-03-22 MED ORDER — PHENYLEPHRINE HCL 10 MG/ML IJ SOLN
INTRAMUSCULAR | Status: DC | PRN
  Administered 2014-03-22: 80 ug via INTRAVENOUS
  Administered 2014-03-22: 120 ug via INTRAVENOUS

## 2014-03-22 MED ORDER — IOHEXOL 300 MG/ML  SOLN
INTRAMUSCULAR | Status: DC | PRN
  Administered 2014-03-22: 24 mL via URETHRAL

## 2014-03-22 MED ORDER — ONDANSETRON HCL 4 MG/2ML IJ SOLN
INTRAMUSCULAR | Status: AC
  Filled 2014-03-22: qty 2

## 2014-03-22 MED ORDER — AMLODIPINE BESYLATE 10 MG PO TABS
10.0000 mg | ORAL_TABLET | Freq: Every day | ORAL | Status: DC
  Administered 2014-03-22 – 2014-03-24 (×3): 10 mg via ORAL
  Filled 2014-03-22 (×3): qty 1

## 2014-03-22 MED ORDER — ROCURONIUM BROMIDE 50 MG/5ML IV SOLN
INTRAVENOUS | Status: AC
  Filled 2014-03-22: qty 1

## 2014-03-22 MED ORDER — HYDROMORPHONE HCL 1 MG/ML IJ SOLN: 0.2500 mg | INTRAMUSCULAR | Status: DC | PRN

## 2014-03-22 MED ORDER — LIDOCAINE HCL (CARDIAC) 20 MG/ML IV SOLN
INTRAVENOUS | Status: DC | PRN
  Administered 2014-03-22: 100 mg via INTRAVENOUS

## 2014-03-22 MED ORDER — HYDRALAZINE HCL 20 MG/ML IJ SOLN
INTRAMUSCULAR | Status: AC
  Filled 2014-03-22: qty 1

## 2014-03-22 MED ORDER — PROPOFOL 10 MG/ML IV BOLUS
INTRAVENOUS | Status: AC
  Filled 2014-03-22: qty 20

## 2014-03-22 MED ORDER — MIDAZOLAM HCL 2 MG/2ML IJ SOLN
INTRAMUSCULAR | Status: AC
  Filled 2014-03-22: qty 2

## 2014-03-22 MED ORDER — FENTANYL CITRATE 0.05 MG/ML IJ SOLN
INTRAMUSCULAR | Status: AC
  Filled 2014-03-22: qty 5

## 2014-03-22 MED ORDER — ONDANSETRON HCL 4 MG/2ML IJ SOLN
INTRAMUSCULAR | Status: DC | PRN
  Administered 2014-03-22: 4 mg via INTRAVENOUS

## 2014-03-22 MED ORDER — SUCCINYLCHOLINE CHLORIDE 20 MG/ML IJ SOLN
INTRAMUSCULAR | Status: DC | PRN
  Administered 2014-03-22: 100 mg via INTRAVENOUS

## 2014-03-22 SURGICAL SUPPLY — 35 items
ADAPTER CATH URET PLST 4-6FR (CATHETERS) IMPLANT
ADPR CATH URET STRL DISP 4-6FR (CATHETERS)
APL SKNCLS STERI-STRIP NONHPOA (GAUZE/BANDAGES/DRESSINGS)
BAG URINE DRAINAGE (UROLOGICAL SUPPLIES) ×3 IMPLANT
BENZOIN TINCTURE PRP APPL 2/3 (GAUZE/BANDAGES/DRESSINGS) IMPLANT
BLADE 10 SAFETY STRL DISP (BLADE) ×3 IMPLANT
BLADE SURG ROTATE 9660 (MISCELLANEOUS) IMPLANT
BUCKET BIOHAZARD WASTE 5 GAL (MISCELLANEOUS) ×3 IMPLANT
CATH FOLEY 2WAY SLVR  5CC 16FR (CATHETERS)
CATH FOLEY 2WAY SLVR 5CC 16FR (CATHETERS) IMPLANT
CATH URET 5FR 28IN CONE TIP (BALLOONS)
CATH URET 5FR 28IN OPEN ENDED (CATHETERS) ×5 IMPLANT
CATH URET 5FR 70CM CONE TIP (BALLOONS) IMPLANT
COVER SURGICAL LIGHT HANDLE (MISCELLANEOUS) ×3 IMPLANT
DRAPE CAMERA CLOSED 9X96 (DRAPES) ×6 IMPLANT
GLOVE BIO SURGEON STRL SZ7.5 (GLOVE) ×3 IMPLANT
GOWN STRL REUS W/ TWL XL LVL3 (GOWN DISPOSABLE) ×2 IMPLANT
GOWN STRL REUS W/TWL XL LVL3 (GOWN DISPOSABLE) ×6
GUIDEWIRE ANG ZIPWIRE 038X150 (WIRE) ×2 IMPLANT
GUIDEWIRE COOK  .035 (WIRE) IMPLANT
GUIDEWIRE STR DUAL SENSOR (WIRE) IMPLANT
H R LUBE JELLY XXX (MISCELLANEOUS) ×3 IMPLANT
KIT ROOM TURNOVER OR (KITS) ×3 IMPLANT
NS IRRIG 1000ML POUR BTL (IV SOLUTION) ×3 IMPLANT
PACK CYSTOSCOPY (CUSTOM PROCEDURE TRAY) ×3 IMPLANT
PAD ARMBOARD 7.5X6 YLW CONV (MISCELLANEOUS) ×6 IMPLANT
PLUG CATH AND CAP STER (CATHETERS) IMPLANT
STENT URET 6FRX24 CONTOUR (STENTS) IMPLANT
STENT URET 6FRX26 CONTOUR (STENTS) ×2 IMPLANT
SYR 20CC LL (SYRINGE) ×3 IMPLANT
SYR CONTROL 10ML LL (SYRINGE) ×3 IMPLANT
SYRINGE TOOMEY DISP (SYRINGE) IMPLANT
UNDERPAD 30X30 INCONTINENT (UNDERPADS AND DIAPERS) ×3 IMPLANT
WATER STERILE IRR 1000ML POUR (IV SOLUTION) ×3 IMPLANT
WIRE COONS/BENSON .038X145CM (WIRE) IMPLANT

## 2014-03-22 NOTE — Progress Notes (Signed)
Inpatient Diabetes Program Recommendations  AACE/ADA: New Consensus Statement on Inpatient Glycemic Control (2013)  Target Ranges:  Prepandial:   less than 140 mg/dL      Peak postprandial:   less than 180 mg/dL (1-2 hours)      Critically ill patients:  140 - 180 mg/dL    Results for NAOKO, DIPERNA (MRN [id number]) as of 03/22/2014 14:03  Ref. Range 03/21/2014 06:01 03/21/2014 12:09 03/21/2014 16:58 03/21/2014 21:21  Glucose-Capillary Latest Range: 70-99 mg/dL 389 (H) 374 (H) 289 (H) 306 (H)    Results for SION, THANE (MRN [id number]) as of 03/22/2014 14:03  Ref. Range 03/22/2014 06:13 03/22/2014 11:37  Glucose-Capillary Latest Range: 70-99 mg/dL 292 (H) 261 (H)     Chief Complaint: Shortness of breath.  HPI: 59 y.o. female with history of diabetes mellitus type 2 COPD and hypertension. Patient states she has been having productive cough and shortness of breath which started 2 weeks ago and gradually improved but started becoming worse again last 2 days.  Patient states she has not been taking her medications for last few months because she was not able to afford it.  Patient has been admitted for sepsis probably from pneumonia and also possible UTI.     Home DM Meds: Metformin 500 mg bid (patient not taking due to inability to afford)  Glipizide 2.5 mg bid (patient not taking due to inability to afford)   Current Orders: Lantus 25 units daily  Novolog Moderate SSI tid ac + HS      Novolog 4 units tidwc     **Note patient has Medicaid coverage but does not have PCP. Will place care management consult.  **I am surprised patient cannot afford her DM medications. Patient has Medicaid which provides most Rxs for $3 a piece.    Spoke with patient today about her DM care regimen at home.  Patient stated she needs help finding a PCP.  Hasn't taken oral DM medications for a while now b/c she states she cannot afford.  Was candid  with patient and discussed the possibility of starting insulin at home b/c her blood sugars are so high.  Patient open to using insulin at home if needed.  Discussed with patient that we have drawn an A1c level and are waiting for the results.  Patient stated she knows what an A1c is and is afraid the result will be high.  Discussed with patient the extreme importance of good CBG control at home.  Discussed what Lantus and Novolog insulins are and how to take.  Explained to patient that Lantus needs to be given at the same time everyday regardless of food intake and that Novolog is usually dosed with food intake.  Patient stated to me she needs a Rx for a new CBG meter for home.  Educated patient on insulin pen use at home.  Ordered insulin flexpen starter kit.  Reviewed all steps if insulin pen including attachment of needle, 2-unit air shot, dialing up dose, giving injection, removing needle, disposal of sharps, storage of unused insulin, disposal of insulin etc.  Patient able to provide successful return demonstration.  Also reviewed troubleshooting with insulin pen.  MD to give patient Rxs for insulin pens and insulin pen needles.    MD- If you decide to send patient home on insulin, please give patient Rxs for insulin pens and Insulin pen needles.  Patient has Medicaid and can get all insulin Rxs for $3.  1. Lantus Solostar insulin pen [Order #  [id number]]  2. Novolog Flexpen [Order #[id number]]  3. Insulin pen needles 31 g x 83m [Order # [id number]] 4. CBG Meter Rx [Order # [id number]    Will follow JWyn QuakerRN, MSN, CDE Diabetes Coordinator Inpatient Diabetes Program Team Pager: [phone](8a-10p)

## 2014-03-22 NOTE — Progress Notes (Signed)
Report called off to short stay; pre-op checklist completed; CHG bath given; pt taken off telemetry and called in to CCMD; IV saline locked; pt transported off unit to OR for procedure. Pt had questions for MD so no consent signed yet. MD to get consent at holding area. Francis Gaines Miasha Emmons RN.

## 2014-03-22 NOTE — Progress Notes (Signed)
PROGRESS NOTE  [name] UUV:[id number] DOB: 1955/03/10 DOA: 03/20/2014 PCP: No PCP Per Patient  Brief history 59 year old female with a history of diabetes mellitus, hypertension, COPD, depression presented with shortness of breath, coughing, and fever. The patient was initially placed on an insulin drip for serum glucose of 565. She was transitioned to subcutaneous insulin. Workup has revealed sepsis secondary to pneumonia and pyonephrosis. The patient was initially treated with ceftriaxone and azithromycin. She continued to have persistent fever. As a result, CT of the abdomen and pelvis was obtained and revealed an obstructive left ureteral stone as well as left renal pelvis pyonephrosis.  Urology, Dr. Tresa Moore, was consulted.  He took her to surgery on 03/22/14 and a ureteral stent was placed. Assessment/Plan: Sepsis -present at the time of admission -secondary to pneumonia and UTI -continue IV abx -continue IVF -Influenza PCR--negative -f/u HIV--neg -Patient with persistent fever, but trending down -WBC trending down -03/20/2014 blood cultures negative CAP -Continue intravenous ceftriaxone and azithromycin--D#3 -urine strep pneumo antigen negative -urine Legionella--negative Obstructive L-ureteral stone/Pyonephrosis -03/20/2014 urine culture--pansensitive Citrobacter -follow up surgical cultures on 03/22/14 and adjust antibiotics accordingly -appreciate Dr. Tresa Moore, case discussed with him AKI -Secondary to sepsis, volume depletion and obstructive uropathy -Renal ultrasound--left hydronephrosis -Baseline creatinine 0.7-1.0 Diabetes mellitus type 2 -Has not taken any medications in 4 months -Hemoglobin A1c--11.1 -NovoLog sliding scale -Increased Lantus to 25 units daily, but will not make changes today as the patient was nothing by mouth for her surgical procedure -NovoLog 4 units with meals Hypertension -Has been unable to afford her medications x 4 months -Restart  amlodipine as the patient's blood pressure is increasing  COPD -continues to smoke -tobacco cessation discussed -stable on RA Chronic pain syndrome -pt follows Dr. London Pepper -continue lyrica and Suboxone Diabetic dermopathy  Family Communication: Pt at beside Disposition Plan: Home when medically stable         Procedures/Studies: Ct Abdomen Pelvis Wo Contrast  03/22/2014   CLINICAL DATA:  Urinary tract infection, bilateral lower quadrant pain. Mild leukocytosis. Onset 1 day prior.  EXAM: CT ABDOMEN AND PELVIS WITHOUT CONTRAST  TECHNIQUE: Multidetector CT imaging of the abdomen and pelvis was performed following the standard protocol without IV contrast.  COMPARISON:  Ultrasound 03/21/2014  FINDINGS: Lower chest:  Lung bases are clear.  Hepatobiliary: No focal hepatic lesion on this noncontrast exam. Low-attenuation liver system hepatic steatosis. Patient status post cholecystectomy.  Pancreas: Pancreas is normal. No ductal dilatation. No pancreatic inflammation.  Spleen: Normal spleen.  Adrenals/urinary tract: Adrenal glands are normal.  The left kidney is edematous. There is mild hydronephrosis of the left renal collecting system. There is hydronephrosis secondary to obstructing stone in the proximal left ureter . This calculus measures 7 mm (image 48, series 2) at the L3 vertebral body level and is faintly seen on the CT tomogram.  There is high-density material within the left renal pelvis cysts with hemorrhage or debris. There is fluid along the margins of the anterior posterior para renal fascia on the left. There is a tiny gas pockets within the left renal collecting system on image 31, series 2. No distal ureteral calculi. No calculi within the right kidney. No bladder calculi  Stomach/Bowel: The stomach, small bowel, appendix, cecum are normal. Colon rectosigmoid colon are normal.  Vascular/Lymphatic: Abdominal aorta is normal caliber. There small periaortic lymph nodes left aorta  measuring up to 10 mm short axis (image 39).  Reproductive: The uterus and ovaries  are normal. The left gonadal vein is prominent  Musculoskeletal: No aggressive osseous lesion.  Other: No free fluid in the pelvis.  IMPRESSION: 1. Obstructing calculus within the proximal left ureter with mild hydronephrosis and renal edema. 2. Debris within the left renal pelvis and small locule of gas are consistent with pyonephrosis superimposed on hydronephrosis. Difficult to evaluate for pyelonephritis without IV contrast 3. Hepatic steatosis.   Electronically Signed   By: Suzy Bouchard M.D.   On: 03/22/2014 07:57   US Renal  03/21/2014   CLINICAL DATA:  Acute kidney injury.  Left flank pain and hematuria.  EXAM: RENAL/URINARY TRACT ULTRASOUND COMPLETE  COMPARISON:  08/06/2012  FINDINGS: Right Kidney:  Length: 13.6 cm.  No hydronephrosis.  Left Kidney:  Length: 15.6 cm. Moderate left-sided hydronephrosis. Apparent complexity within the collecting system, including on image 32.  Bladder:  Partially decompressed.  IMPRESSION: 1. Moderate left-sided hydronephrosis. This may relate to distal obstruction secondary to a stone or other process. Apparent echogenicity within the dilated left renal collecting system could be artifactual (given patient morbid obesity). Cannot exclude pyonephrosis. CT of the abdomen and pelvis (either stone study or contrast-enhanced CT) may be informative. These results were called by telephone at the time of interpretation on 03/21/2014 at 9:58 pm to the patient's nurse, Threasa Beards who verbally acknowledged these results. 2. Normal appearance of the right kidney.   Electronically Signed   By: Abigail Miyamoto M.D.   On: 03/21/2014 21:59   Dg Chest Port 1 View  03/20/2014   CLINICAL DATA:  Subacute onset of shortness of breath for 2 weeks. Cough, dizziness, congestion and confusion. Generalized weakness. Initial encounter.  EXAM: PORTABLE CHEST - 1 VIEW  COMPARISON:  Chest radiograph performed 08/03/2013   FINDINGS: The lungs are well-aerated. Minimal left basilar opacity likely reflects atelectasis. There is no evidence of pleural effusion or pneumothorax.  The cardiomediastinal silhouette is within normal limits. No acute osseous abnormalities are seen.  IMPRESSION: Minimal left basilar opacity likely reflects atelectasis; lungs otherwise clear.   Electronically Signed   By: Garald Balding M.D.   On: 03/20/2014 02:10         Subjective: Patient continues to complain of left flank pain. She has some nausea without emesis. Denies any chest pain, shortness breath, vomiting, diarrhea, rashes, headache.   Objective: Filed Vitals:   03/22/14 1758 03/22/14 1800 03/22/14 1806 03/22/14 1807  BP: 178/81 169/76 171/80 174/57  Pulse:  105    Temp:      TempSrc:      Resp:  21 23   Height:      Weight:      SpO2:  99%      Intake/Output Summary (Last 24 hours) at 03/22/14 1823 Last data filed at 03/22/14 1743  Gross per 24 hour  Intake   3660 ml  Output   3500 ml  Net    160 ml   Weight change:  Exam:   General:  Pt is alert, follows commands appropriately, not in acute distress  HEENT: No icterus, No thrush, [place]/AT  Cardiovascular: RRR, S1/S2, no rubs, no gallops  Respiratory: Bibasilar rales, left greater than right. No wheezing  Abdomen: Soft/+BS, left lower quadrant tenderness without any rebound, non distended, no guarding  Extremities: 1+LE edema, No lymphangitis, No petechiae, No rashes, no synovitis  Data Reviewed: Basic Metabolic Panel:  Recent Labs Lab 03/20/14 0138 03/20/14 1133 03/21/14 0535 03/22/14 0545  NA 127* 131* 132* 133*  K 3.7 4.2 3.9  4.2  CL 93* 97 98 100  CO2 23 23 27 27   GLUCOSE 465* 183* 387* 321*  BUN 11 14 16 16   CREATININE 1.41* 1.33* 1.55* 1.46*  CALCIUM 8.6 8.6 8.1* 8.4   Liver Function Tests:  Recent Labs Lab 03/20/14 0138  AST 13  ALT 15  ALKPHOS 122*  BILITOT 0.6  PROT 7.2  ALBUMIN 2.6*   No results for input(s): LIPASE,  AMYLASE in the last 168 hours. No results for input(s): AMMONIA in the last 168 hours. CBC:  Recent Labs Lab 03/20/14 0138 03/21/14 0535 03/22/14 0545  WBC 14.9* 11.7* 11.5*  NEUTROABS 13.1*  --   --   HGB 13.3 12.2 11.1*  HCT 37.3 36.1 34.2*  MCV 80.6 83.0 85.3  PLT 340 320 362   Cardiac Enzymes:  Recent Labs Lab 03/20/14 0542 03/22/14 0545  CKTOTAL  --  22  TROPONINI 0.03  --    BNP: Invalid input(s): POCBNP CBG:  Recent Labs Lab 03/21/14 2121 03/22/14 0613 03/22/14 1137 03/22/14 1557 03/22/14 1743  GLUCAP 306* 292* 261* 226* 236*    Recent Results (from the past 240 hour(s))  Blood Culture (routine x 2)     Status: None (Preliminary result)   Collection Time: 03/20/14  1:38 AM  Result Value Ref Range Status   Specimen Description BLOOD RIGHT ARM  Final   Special Requests BOTTLES DRAWN AEROBIC AND ANAEROBIC 5CC EA  Final   Culture   Final           BLOOD CULTURE RECEIVED NO GROWTH TO DATE CULTURE WILL BE HELD FOR 5 DAYS BEFORE ISSUING A FINAL NEGATIVE REPORT Performed at [facility]    Report Status PENDING  Incomplete  Blood Culture (routine x 2)     Status: None (Preliminary result)   Collection Time: 03/20/14  1:45 AM  Result Value Ref Range Status   Specimen Description BLOOD LEFT ARM  Final   Special Requests BOTTLES DRAWN AEROBIC AND ANAEROBIC 5CC EA  Final   Culture   Final           BLOOD CULTURE RECEIVED NO GROWTH TO DATE CULTURE WILL BE HELD FOR 5 DAYS BEFORE ISSUING A FINAL NEGATIVE REPORT Performed at [facility]    Report Status PENDING  Incomplete  Urine culture     Status: None   Collection Time: 03/20/14  2:37 AM  Result Value Ref Range Status   Specimen Description URINE, CATHETERIZED  Final   Special Requests NONE  Final   Colony Count   Final    70,000 COLONIES/ML Performed at [facility]    Culture   Final    CITROBACTER KOSERI Performed at [facility]    Report Status 03/22/2014 FINAL   Final   Organism ID, Bacteria CITROBACTER KOSERI  Final      Susceptibility   Citrobacter koseri - MIC*    CEFAZOLIN <=4 SENSITIVE Sensitive     CEFTRIAXONE <=1 SENSITIVE Sensitive     CIPROFLOXACIN <=0.25 SENSITIVE Sensitive     GENTAMICIN <=1 SENSITIVE Sensitive     LEVOFLOXACIN <=0.12 SENSITIVE Sensitive     NITROFURANTOIN 32 SENSITIVE Sensitive     TOBRAMYCIN <=1 SENSITIVE Sensitive     TRIMETH/SULFA <=20 SENSITIVE Sensitive     PIP/TAZO <=4 SENSITIVE Sensitive     * CITROBACTER KOSERI  Culture, sputum-assessment     Status: None   Collection Time: 03/20/14  6:51 AM  Result Value Ref Range Status  Specimen Description SPUTUM  Final   Special Requests NONE  Final   Sputum evaluation   Final    MICROSCOPIC FINDINGS SUGGEST THAT THIS SPECIMEN IS NOT REPRESENTATIVE OF LOWER RESPIRATORY SECRETIONS. PLEASE RECOLLECT. RESULT CALLED TO, READ BACK BY AND VERIFIED WITH: Theotis Barrio 03/20/14 AT 0904 BY RHOLMES   Report Status 03/20/2014 FINAL  Final  Surgical pcr screen     Status: None   Collection Time: 03/22/14  3:49 PM  Result Value Ref Range Status   MRSA, PCR NEGATIVE NEGATIVE Final   Staphylococcus aureus NEGATIVE NEGATIVE Final    Comment:        The Xpert SA Assay (FDA approved for NASAL specimens in patients over 5 years of age), is one component of a comprehensive surveillance program.  Test performance has been validated by [facility] for patients greater than or equal to 59 year old. It is not intended to diagnose infection nor to guide or monitor treatment.      Scheduled Meds: . [MAR Hold] azithromycin  500 mg Intravenous Q24H  . [MAR Hold] buprenorphine  8 mg Sublingual BID  . [MAR Hold] cefTRIAXone (ROCEPHIN)  IV  1 g Intravenous Q24H  . [MAR Hold] enoxaparin (LOVENOX) injection  40 mg Subcutaneous Q24H  . hydrALAZINE      . Influenza vac split quadrivalent PF  0.5 mL Intramuscular Tomorrow-1000  . [MAR Hold] insulin aspart  0-15 Units Subcutaneous TID WC    . [MAR Hold] insulin aspart  0-5 Units Subcutaneous QHS  . [MAR Hold] insulin aspart  4 Units Subcutaneous TID WC  . [MAR Hold] insulin glargine  25 Units Subcutaneous Daily  . insulin starter kit- pen needles  1 kit Other Once  . [MAR Hold] pregabalin  75 mg Oral QID  . [MAR Hold] sodium chloride  3 mL Intravenous Q12H   Continuous Infusions: . sodium chloride 10 mL/hr at 03/22/14 1608  . sodium chloride 0.9 % 1,000 mL with potassium chloride 20 mEq infusion 100 mL/hr at 03/22/14 2233     Rasheida Broden, DO  [facility] Pager [phone]  If 7PM-7AM, please contact night-coverage [email or web address] Password [facility] 03/22/2014, 6:23 PM   LOS: 2 days

## 2014-03-22 NOTE — Anesthesia Procedure Notes (Signed)
Procedure Name: Intubation Date/Time: 03/22/2014 4:46 PM Performed by: Ollen Bowl Pre-anesthesia Checklist: Patient identified, Emergency Drugs available, Suction available, Patient being monitored and Timeout performed Patient Re-evaluated:Patient Re-evaluated prior to inductionOxygen Delivery Method: Circle system utilized and Simple face mask Preoxygenation: Pre-oxygenation with 100% oxygen Intubation Type: IV induction Ventilation: Mask ventilation without difficulty Laryngoscope Size: Mac and 4 Grade View: Grade II Tube type: Oral Tube size: 7.5 mm Number of attempts: 1 Airway Equipment and Method: Patient positioned with wedge pillow and Stylet Placement Confirmation: ETT inserted through vocal cords under direct vision,  positive ETCO2 and breath sounds checked- equal and bilateral Secured at: 22 cm Tube secured with: Tape Dental Injury: Teeth and Oropharynx as per pre-operative assessment

## 2014-03-22 NOTE — Consult Note (Signed)
Reason for Consult: Left Ureteral Joaquim Lai, Urosepesis / Pyonephrosis, Acute Renal Failure  Referring Physician: Orson Eva MD  [name] is an 59 y.o. female.   HPI:   1 - Left Ureteral Stone - left 4m prox ureteral stone with mod hydro by CT 1/25 on eval persistent fevers, flank pain during admissio nfor sepsis. Stone is 260HU, SSD 16cm. NO additional stones.  2 - Urosepsis, Pyonephrosis - pt in ICU on hosp service for sepsis from urinary v. pulm source. UCX on admit with pan-sensitve citrobacter, now on IV ABX with resolution of tachycardia but some persistent fevers malaise. Imaging with likley pus, small gas in left collecting system w/o frank abscess or parenchymal gas. She is noncompliant diabetic.  3 - Acute Renal Failure - basleine Cr approx 1.0. Cr during admission 1.5's with left hydro and stone as per above.  PMH sig for COPD, DM2(non-compliant), Morbid obesity, chole, salivary stone surgery.   Today [name] is seen as urgent consult for above, specifically for furhter evaluation and management of her obstructing left ureteral stone with pyonephrosis. Last meal today breakfast per report.   Past Medical History  Diagnosis Date  . Hypertension   . Diabetes mellitus without complication   . COPD (chronic obstructive pulmonary disease)   . Shortness of breath   . Depression   . Arthritis     Past Surgical History  Procedure Laterality Date  . Cholecystectomy    . Tubal ligation    . Carpel tunnel    . Breast surgery      LUMPECTOMY     Family History  Problem Relation Age of Onset  . Adopted: Yes    Social History:  reports that she has been smoking Cigarettes.  She has a 40 pack-year smoking history. She has never used smokeless tobacco. She reports that she does not drink alcohol or use illicit drugs.  Allergies:  Allergies  Allergen Reactions  . Ace Inhibitors Anaphylaxis    Medications: I have reviewed the patient's current medications.  Results for orders  placed or performed during the hospital encounter of 03/20/14 (from the past 48 hour(s))  Glucose, capillary     Status: Abnormal   Collection Time: 03/20/14 10:42 AM  Result Value Ref Range   Glucose-Capillary 194 (H) 70 - 99 mg/dL  Basic metabolic panel     Status: Abnormal   Collection Time: 03/20/14 11:33 AM  Result Value Ref Range   Sodium 131 (L) 135 - 145 mmol/L   Potassium 4.2 3.5 - 5.1 mmol/L   Chloride 97 96 - 112 mmol/L   CO2 23 19 - 32 mmol/L   Glucose, Bld 183 (H) 70 - 99 mg/dL   BUN 14 6 - 23 mg/dL   Creatinine, Ser 1.33 (H) 0.50 - 1.10 mg/dL   Calcium 8.6 8.4 - 10.5 mg/dL   GFR calc non Af Amer 44 (L) >90 mL/min   GFR calc Af Amer 51 (L) >90 mL/min    Comment: (NOTE) The eGFR has been calculated using the CKD EPI equation. This calculation has not been validated in all clinical situations. eGFR's persistently <90 mL/min signify possible Chronic Kidney Disease.    Anion gap 11 5 - 15  Glucose, capillary     Status: Abnormal   Collection Time: 03/20/14 11:56 AM  Result Value Ref Range   Glucose-Capillary 183 (H) 70 - 99 mg/dL  Glucose, capillary     Status: Abnormal   Collection Time: 03/20/14  1:07 PM  Result  Value Ref Range   Glucose-Capillary 229 (H) 70 - 99 mg/dL  Glucose, capillary     Status: Abnormal   Collection Time: 03/20/14  2:14 PM  Result Value Ref Range   Glucose-Capillary 269 (H) 70 - 99 mg/dL  Glucose, capillary     Status: Abnormal   Collection Time: 03/20/14  3:00 PM  Result Value Ref Range   Glucose-Capillary 183 (H) 70 - 99 mg/dL  Glucose, capillary     Status: Abnormal   Collection Time: 03/20/14  4:11 PM  Result Value Ref Range   Glucose-Capillary 140 (H) 70 - 99 mg/dL  Glucose, capillary     Status: Abnormal   Collection Time: 03/20/14  9:31 PM  Result Value Ref Range   Glucose-Capillary 306 (H) 70 - 99 mg/dL  CBC     Status: Abnormal   Collection Time: 03/21/14  5:35 AM  Result Value Ref Range   WBC 11.7 (H) 4.0 - 10.5 K/uL    RBC 4.35 3.87 - 5.11 MIL/uL   Hemoglobin 12.2 12.0 - 15.0 g/dL   HCT 36.1 36.0 - 46.0 %   MCV 83.0 78.0 - 100.0 fL   MCH 28.0 26.0 - 34.0 pg   MCHC 33.8 30.0 - 36.0 g/dL   RDW 12.9 11.5 - 15.5 %   Platelets 320 150 - 400 K/uL  Basic metabolic panel     Status: Abnormal   Collection Time: 03/21/14  5:35 AM  Result Value Ref Range   Sodium 132 (L) 135 - 145 mmol/L   Potassium 3.9 3.5 - 5.1 mmol/L   Chloride 98 96 - 112 mmol/L   CO2 27 19 - 32 mmol/L   Glucose, Bld 387 (H) 70 - 99 mg/dL   BUN 16 6 - 23 mg/dL   Creatinine, Ser 1.55 (H) 0.50 - 1.10 mg/dL   Calcium 8.1 (L) 8.4 - 10.5 mg/dL   GFR calc non Af Amer 36 (L) >90 mL/min   GFR calc Af Amer 42 (L) >90 mL/min    Comment: (NOTE) The eGFR has been calculated using the CKD EPI equation. This calculation has not been validated in all clinical situations. eGFR's persistently <90 mL/min signify possible Chronic Kidney Disease.    Anion gap 7 5 - 15  Glucose, capillary     Status: Abnormal   Collection Time: 03/21/14  6:01 AM  Result Value Ref Range   Glucose-Capillary 389 (H) 70 - 99 mg/dL  Glucose, capillary     Status: Abnormal   Collection Time: 03/21/14 12:09 PM  Result Value Ref Range   Glucose-Capillary 374 (H) 70 - 99 mg/dL   Comment 1 Notify RN    Comment 2 Documented in Chart   Glucose, capillary     Status: Abnormal   Collection Time: 03/21/14  4:58 PM  Result Value Ref Range   Glucose-Capillary 289 (H) 70 - 99 mg/dL   Comment 1 Notify RN    Comment 2 Documented in Chart   Influenza panel by PCR (type A & B, H1N1)     Status: None   Collection Time: 03/21/14  5:23 PM  Result Value Ref Range   Influenza A By PCR NEGATIVE NEGATIVE   Influenza B By PCR NEGATIVE NEGATIVE   H1N1 flu by pcr NOT DETECTED NOT DETECTED    Comment:        The Xpert Flu assay (FDA approved for nasal aspirates or washes and nasopharyngeal swab specimens), is intended as an aid in the diagnosis of  influenza and should not be used as a  sole basis for treatment.   Glucose, capillary     Status: Abnormal   Collection Time: 03/21/14  9:21 PM  Result Value Ref Range   Glucose-Capillary 306 (H) 70 - 99 mg/dL  CBC     Status: Abnormal   Collection Time: 03/22/14  5:45 AM  Result Value Ref Range   WBC 11.5 (H) 4.0 - 10.5 K/uL   RBC 4.01 3.87 - 5.11 MIL/uL   Hemoglobin 11.1 (L) 12.0 - 15.0 g/dL   HCT 34.2 (L) 36.0 - 46.0 %   MCV 85.3 78.0 - 100.0 fL   MCH 27.7 26.0 - 34.0 pg   MCHC 32.5 30.0 - 36.0 g/dL   RDW 13.2 11.5 - 15.5 %   Platelets 362 150 - 400 K/uL  Basic metabolic panel     Status: Abnormal   Collection Time: 03/22/14  5:45 AM  Result Value Ref Range   Sodium 133 (L) 135 - 145 mmol/L   Potassium 4.2 3.5 - 5.1 mmol/L   Chloride 100 96 - 112 mmol/L   CO2 27 19 - 32 mmol/L   Glucose, Bld 321 (H) 70 - 99 mg/dL   BUN 16 6 - 23 mg/dL   Creatinine, Ser 1.46 (H) 0.50 - 1.10 mg/dL   Calcium 8.4 8.4 - 10.5 mg/dL   GFR calc non Af Amer 39 (L) >90 mL/min   GFR calc Af Amer 45 (L) >90 mL/min    Comment: (NOTE) The eGFR has been calculated using the CKD EPI equation. This calculation has not been validated in all clinical situations. eGFR's persistently <90 mL/min signify possible Chronic Kidney Disease.    Anion gap 6 5 - 15  CK     Status: None   Collection Time: 03/22/14  5:45 AM  Result Value Ref Range   Total CK 22 7 - 177 U/L  Glucose, capillary     Status: Abnormal   Collection Time: 03/22/14  6:13 AM  Result Value Ref Range   Glucose-Capillary 292 (H) 70 - 99 mg/dL    Ct Abdomen Pelvis Wo Contrast  03/22/2014   CLINICAL DATA:  Urinary tract infection, bilateral lower quadrant pain. Mild leukocytosis. Onset 1 day prior.  EXAM: CT ABDOMEN AND PELVIS WITHOUT CONTRAST  TECHNIQUE: Multidetector CT imaging of the abdomen and pelvis was performed following the standard protocol without IV contrast.  COMPARISON:  Ultrasound 03/21/2014  FINDINGS: Lower chest:  Lung bases are clear.  Hepatobiliary: No focal  hepatic lesion on this noncontrast exam. Low-attenuation liver system hepatic steatosis. Patient status post cholecystectomy.  Pancreas: Pancreas is normal. No ductal dilatation. No pancreatic inflammation.  Spleen: Normal spleen.  Adrenals/urinary tract: Adrenal glands are normal.  The left kidney is edematous. There is mild hydronephrosis of the left renal collecting system. There is hydronephrosis secondary to obstructing stone in the proximal left ureter . This calculus measures 7 mm (image 48, series 2) at the L3 vertebral body level and is faintly seen on the CT tomogram.  There is high-density material within the left renal pelvis cysts with hemorrhage or debris. There is fluid along the margins of the anterior posterior para renal fascia on the left. There is a tiny gas pockets within the left renal collecting system on image 31, series 2. No distal ureteral calculi. No calculi within the right kidney. No bladder calculi  Stomach/Bowel: The stomach, small bowel, appendix, cecum are normal. Colon rectosigmoid colon are normal.  Vascular/Lymphatic: Abdominal  aorta is normal caliber. There small periaortic lymph nodes left aorta measuring up to 10 mm short axis (image 39).  Reproductive: The uterus and ovaries are normal. The left gonadal vein is prominent  Musculoskeletal: No aggressive osseous lesion.  Other: No free fluid in the pelvis.  IMPRESSION: 1. Obstructing calculus within the proximal left ureter with mild hydronephrosis and renal edema. 2. Debris within the left renal pelvis and small locule of gas are consistent with pyonephrosis superimposed on hydronephrosis. Difficult to evaluate for pyelonephritis without IV contrast 3. Hepatic steatosis.   Electronically Signed   By: Suzy Bouchard M.D.   On: 03/22/2014 07:57   US Renal  03/21/2014   CLINICAL DATA:  Acute kidney injury.  Left flank pain and hematuria.  EXAM: RENAL/URINARY TRACT ULTRASOUND COMPLETE  COMPARISON:  08/06/2012  FINDINGS: Right  Kidney:  Length: 13.6 cm.  No hydronephrosis.  Left Kidney:  Length: 15.6 cm. Moderate left-sided hydronephrosis. Apparent complexity within the collecting system, including on image 32.  Bladder:  Partially decompressed.  IMPRESSION: 1. Moderate left-sided hydronephrosis. This may relate to distal obstruction secondary to a stone or other process. Apparent echogenicity within the dilated left renal collecting system could be artifactual (given patient morbid obesity). Cannot exclude pyonephrosis. CT of the abdomen and pelvis (either stone study or contrast-enhanced CT) may be informative. These results were called by telephone at the time of interpretation on 03/21/2014 at 9:58 pm to the patient's nurse, Threasa Beards who verbally acknowledged these results. 2. Normal appearance of the right kidney.   Electronically Signed   By: Abigail Miyamoto M.D.   On: 03/21/2014 21:59    Review of Systems  Constitutional: Positive for fever and malaise/fatigue.  HENT: Negative.   Eyes: Negative.   Respiratory: Negative.   Cardiovascular: Negative.   Gastrointestinal: Positive for nausea.  Genitourinary: Positive for dysuria.  Musculoskeletal: Negative.   Skin: Negative.   Neurological: Negative.   Endo/Heme/Allergies: Negative.   Psychiatric/Behavioral: Negative.    Blood pressure 130/75, pulse 93, temperature 99.6 F (37.6 C), temperature source Oral, resp. rate 20, height 5' 6"  (1.676 m), weight 138.4 kg (305 lb 1.9 oz), SpO2 93 %. Physical Exam  Constitutional: She appears well-developed.  In ICU  Eyes: Pupils are equal, round, and reactive to light.  Neck: Normal range of motion.  Cardiovascular: Normal rate.   Respiratory: Effort normal.  GI: Soft.  Morbid truncal obesity limits sensitivity of exam.   Musculoskeletal: Normal range of motion.  Neurological: She is alert.  Skin: Skin is warm.  Psychiatric: She has a normal mood and affect. Her behavior is normal. Thought content normal.     Assessment/Plan:   1 - Left Ureteral Stone - safest plant for management is renal decompression with stent v. neph tube and management with ureteroscopy or SWL as outpatient after clears infectious parameters. Will arrange for left ureteral stent this afternoon. NPO now.  Risks of cysto, left retrograde, stent includign bleeding, infection, damage to kidney / ureter / bladder / non-relief of obstruction, failure and need for nephrostomy tube discussed. Also restated goal of procedure today is renal decompression only and that her stone will be managed in elective setting as management now would lilkey worsen her infectious state.   2 - Urosepsis, Pyonephrosis - multifactorial with bacteruria, diabetes, and left ureteral obstruciton. Will attempte renal decompression with left ureteral stent as per above.  3 - Acute Renal Failure - likely some intrinsic renal (diabetic nephropathy) with obstructive components. Will decompress as per  above.     [name] 03/22/2014, 10:36 AM

## 2014-03-22 NOTE — Anesthesia Preprocedure Evaluation (Addendum)
Anesthesia Evaluation  Patient identified by MRN, date of birth, ID band Patient awake    Reviewed: Allergy & Precautions, NPO status , Patient's Chart, lab work & pertinent test results  Airway Mallampati: II  TM Distance: >3 FB Neck ROM: Full    Dental no notable dental hx.    Pulmonary neg pulmonary ROS, shortness of breath, sleep apnea , pneumonia -, COPDCurrent Smoker,  breath sounds clear to auscultation  Pulmonary exam normal       Cardiovascular hypertension, Pt. on medications + Peripheral Vascular Disease Rhythm:Regular Rate:Normal     Neuro/Psych negative neurological ROS  negative psych ROS   GI/Hepatic negative GI ROS, Neg liver ROS,   Endo/Other  diabetes, Type 2, Oral Hypoglycemic AgentsMorbid obesity  Renal/GU ARFRenal disease     Musculoskeletal  (+) Arthritis -,   Abdominal (+) + obese,   Peds  Hematology negative hematology ROS (+)   Anesthesia Other Findings   Reproductive/Obstetrics negative OB ROS                           Anesthesia Physical Anesthesia Plan  ASA: III  Anesthesia Plan: General   Post-op Pain Management:    Induction: Intravenous and Rapid sequence  Airway Management Planned: Oral ETT  Additional Equipment: None  Intra-op Plan:   Post-operative Plan: Extubation in OR  Informed Consent: I have reviewed the patients History and Physical, chart, labs and discussed the procedure including the risks, benefits and alternatives for the proposed anesthesia with the patient or authorized representative who has indicated his/her understanding and acceptance.   Dental advisory given  Plan Discussed with: CRNA, Anesthesiologist and Surgeon  Anesthesia Plan Comments:        Anesthesia Quick Evaluation

## 2014-03-22 NOTE — Progress Notes (Signed)
Confirm that K. Schorr N.P.  Did see [place] Results done tonight  And no orders given

## 2014-03-22 NOTE — Brief Op Note (Signed)
03/20/2014 - 03/22/2014  5:12 PM  PATIENT:  [name]  59 y.o. female  PRE-OPERATIVE DIAGNOSIS:  Left Urethral Stone  POST-OPERATIVE DIAGNOSIS:  Left Urethral Stone  PROCEDURE:  Procedure(s): CYSTOSCOPY WITH RETROGRADE PYELOGRAM/URETERAL STENT PLACEMENT (Left)  SURGEON:  Surgeon(s) and Role:    * Alexis Frock, MD - Primary  PHYSICIAN ASSISTANT:   ASSISTANTS: none   ANESTHESIA:   general  EBL:  Total I/O In: 740 [P.O.:240; I.V.:500] Out: 1700 [Urine:1700]  BLOOD ADMINISTERED:none  DRAINS: none   LOCAL MEDICATIONS USED:  Amount: none ml  SPECIMEN:  Source of Specimen:  left renal pelvis fluid  DISPOSITION OF SPECIMEN:  microbiology for gram stain and culture  COUNTS:  YES  TOURNIQUET:  * No tourniquets in log *  DICTATION: .Other Dictation: Dictation Number [id number]  PLAN OF CARE: Admit to inpatient   PATIENT DISPOSITION:  PACU - hemodynamically stable.   Delay start of Pharmacological VTE agent (>24hrs) due to surgical blood loss or risk of bleeding: not applicable

## 2014-03-22 NOTE — Transfer of Care (Signed)
Immediate Anesthesia Transfer of Care Note  Patient: [name]  Procedure(s) Performed: Procedure(s): CYSTOSCOPY WITH RETROGRADE PYELOGRAM/URETERAL STENT PLACEMENT (Left)  Patient Location: PACU  Anesthesia Type:General  Level of Consciousness: awake and alert   Airway & Oxygen Therapy: Patient Spontanous Breathing and Patient connected to face mask oxygen  Post-op Assessment: Report given to PACU RN and Post -op Vital signs reviewed and stable  Post vital signs: Reviewed and stable  Complications: No apparent anesthesia complications

## 2014-03-22 NOTE — Anesthesia Postprocedure Evaluation (Signed)
  Anesthesia Post-op Note  Patient: [name]  Procedure(s) Performed: Procedure(s) (LRB): CYSTOSCOPY WITH RETROGRADE PYELOGRAM/URETERAL STENT PLACEMENT (Left)  Patient Location: PACU  Anesthesia Type: General  Level of Consciousness: awake and alert   Airway and Oxygen Therapy: Patient Spontanous Breathing  Post-op Pain: mild  Post-op Assessment: Post-op Vital signs reviewed, Patient's Cardiovascular Status Stable, Respiratory Function Stable, Patent Airway and No signs of Nausea or vomiting  Last Vitals:  Filed Vitals:   03/22/14 1840  BP: 164/92  Pulse: 116  Temp: 37.1 C  Resp: 20    Post-op Vital Signs: stable   Complications: No apparent anesthesia complications

## 2014-03-22 NOTE — Progress Notes (Signed)
Pt arrived from pacu at [place]; vitals taken; placed back on telemetry called in to [facility]; voided 380ml urine into [facility]; family at bedside. Will continue to monitor quietly. Delia Heady RN

## 2014-03-23 ENCOUNTER — Other Ambulatory Visit: Payer: Self-pay | Admitting: Internal Medicine

## 2014-03-23 LAB — CBC
HEMATOCRIT: 33.5 % — AB (ref 36.0–46.0)
Hemoglobin: 11 g/dL — ABNORMAL LOW (ref 12.0–15.0)
MCH: 28 pg (ref 26.0–34.0)
MCHC: 32.8 g/dL (ref 30.0–36.0)
MCV: 85.2 fL (ref 78.0–100.0)
Platelets: 394 10*3/uL (ref 150–400)
RBC: 3.93 MIL/uL (ref 3.87–5.11)
RDW: 13.5 % (ref 11.5–15.5)
WBC: 9.8 10*3/uL (ref 4.0–10.5)

## 2014-03-23 LAB — BASIC METABOLIC PANEL
Anion gap: 5 (ref 5–15)
BUN: 17 mg/dL (ref 6–23)
CHLORIDE: 99 mmol/L (ref 96–112)
CO2: 28 mmol/L (ref 19–32)
CREATININE: 1.35 mg/dL — AB (ref 0.50–1.10)
Calcium: 8.1 mg/dL — ABNORMAL LOW (ref 8.4–10.5)
GFR calc Af Amer: 50 mL/min — ABNORMAL LOW (ref >90)
GFR calc non Af Amer: 43 mL/min — ABNORMAL LOW (ref >90)
GLUCOSE: 272 mg/dL — AB (ref 70–99)
Potassium: 4.5 mmol/L (ref 3.5–5.1)
SODIUM: 132 mmol/L — AB (ref 135–145)

## 2014-03-23 LAB — GLUCOSE, CAPILLARY
GLUCOSE-CAPILLARY: 215 mg/dL — AB (ref 70–99)
Glucose-Capillary: 298 mg/dL — ABNORMAL HIGH (ref 70–99)
Glucose-Capillary: 185 mg/dL — ABNORMAL HIGH (ref 70–99)
Glucose-Capillary: 178 mg/dL — ABNORMAL HIGH (ref 70–99)

## 2014-03-23 MED ORDER — INSULIN GLARGINE 100 UNIT/ML ~~LOC~~ SOLN
5.0000 [IU] | Freq: Once | SUBCUTANEOUS | Status: AC
  Administered 2014-03-23: 5 [IU] via SUBCUTANEOUS
  Filled 2014-03-23: qty 0.05

## 2014-03-23 MED ORDER — INSULIN GLARGINE 100 UNIT/ML ~~LOC~~ SOLN
30.0000 [IU] | Freq: Every day | SUBCUTANEOUS | Status: DC
  Administered 2014-03-24: 30 [IU] via SUBCUTANEOUS
  Filled 2014-03-23: qty 0.3

## 2014-03-23 MED ORDER — CEPHALEXIN 500 MG PO CAPS
500.0000 mg | ORAL_CAPSULE | Freq: Four times a day (QID) | ORAL | Status: DC
  Administered 2014-03-23 – 2014-03-24 (×4): 500 mg via ORAL
  Filled 2014-03-23 (×7): qty 1

## 2014-03-23 MED ORDER — AZITHROMYCIN 500 MG PO TABS
500.0000 mg | ORAL_TABLET | Freq: Every day | ORAL | Status: DC
  Administered 2014-03-24: 500 mg via ORAL
  Filled 2014-03-23: qty 1

## 2014-03-23 NOTE — Progress Notes (Signed)
1 Day Post-Op  Subjective:  1 - Left Ureteral Stone - left 44mm prox ureteral stone with mod hydro by CT 1/25 on eval persistent fevers, flank pain during admission for sepsis. Stone is 260HU, SSD 16cm. NO additional stones. S/p cysto left ureteral stent placement 1/26 for renal drainage while clearing infection.   2 - Urosepsis, Pyonephrosis - pt in ICU on hosp service for sepsis from urinary v. pulm source. UCX on admit with pan-sensitve citrobacter, now on IV ABX  Imaging with likley pus, small gas in left collecting system w/o frank abscess or parenchymal gas. She is noncompliant diabetic. Transitioned to double coverage PO ABX with azithromycin + keflex.   3 - Acute Renal Failure - basleine Cr approx 1.0. Cr during admission 1.5's with left hydro and stone as per above. Improving s/p left ureteral stent.   Today Danija is improving. No high fevers past 24 hrs, left flank pain. Some stent discomfort with bladder spasms as expected. Cr now trending down.   Objective: Vital signs in last 24 hours: Temp:  [97.8 F (36.6 C)-99.8 F (37.7 C)] 98.7 F (37.1 C) (01/27 1408) Pulse Rate:  [96-116] 98 (01/27 1408) Resp:  [18-23] 20 (01/27 1408) BP: (103-178)/(57-92) 126/84 mmHg (01/27 1408) SpO2:  [90 %-99 %] 97 % (01/27 1408) Last BM Date: 03/22/14  Intake/Output from previous day: 01/26 0701 - 01/27 0700 In: 3260 [P.O.:960; I.V.:2000; IV Piggyback:300] Out: 6144 [Urine:4050] Intake/Output this shift: Total I/O In: 600 [P.O.:600] Out: 600 [Urine:600]  General appearance: alert, cooperative and appears older than stated age Eyes: negative Nose: Nares normal. Septum midline. Mucosa normal. No drainage or sinus tenderness. Neck: supple, symmetrical, trachea midline Back: symmetric, no curvature. ROM normal. No CVA tenderness. Resp: non-labored on room air Cardio: improved regular tachycardia GI: soft, non-tender; bowel sounds normal; no masses,  no organomegaly Extremities: extremities  normal, atraumatic, no cyanosis or edema Pulses: 2+ and symmetric Skin: Skin color, texture, turgor normal. No rashes or lesions Lymph nodes: Cervical, supraclavicular, and axillary nodes normal. Neurologic: Grossly normal  Lab Results:   Recent Labs  03/22/14 0545 03/23/14 0601  WBC 11.5* 9.8  HGB 11.1* 11.0*  HCT 34.2* 33.5*  PLT 362 394   BMET  Recent Labs  03/22/14 0545 03/23/14 0601  NA 133* 132*  K 4.2 4.5  CL 100 99  CO2 27 28  GLUCOSE 321* 272*  BUN 16 17  CREATININE 1.46* 1.35*  CALCIUM 8.4 8.1*   PT/INR No results for input(s): LABPROT, INR in the last 72 hours. ABG No results for input(s): PHART, HCO3 in the last 72 hours.  Invalid input(s): PCO2, PO2  Studies/Results: Ct Abdomen Pelvis Wo Contrast  03/22/2014   CLINICAL DATA:  Urinary tract infection, bilateral lower quadrant pain. Mild leukocytosis. Onset 1 day prior.  EXAM: CT ABDOMEN AND PELVIS WITHOUT CONTRAST  TECHNIQUE: Multidetector CT imaging of the abdomen and pelvis was performed following the standard protocol without IV contrast.  COMPARISON:  Ultrasound 03/21/2014  FINDINGS: Lower chest:  Lung bases are clear.  Hepatobiliary: No focal hepatic lesion on this noncontrast exam. Low-attenuation liver system hepatic steatosis. Patient status post cholecystectomy.  Pancreas: Pancreas is normal. No ductal dilatation. No pancreatic inflammation.  Spleen: Normal spleen.  Adrenals/urinary tract: Adrenal glands are normal.  The left kidney is edematous. There is mild hydronephrosis of the left renal collecting system. There is hydronephrosis secondary to obstructing stone in the proximal left ureter . This calculus measures 7 mm (image 48, series 2) at  the L3 vertebral body level and is faintly seen on the CT tomogram.  There is high-density material within the left renal pelvis cysts with hemorrhage or debris. There is fluid along the margins of the anterior posterior para renal fascia on the left. There is a  tiny gas pockets within the left renal collecting system on image 31, series 2. No distal ureteral calculi. No calculi within the right kidney. No bladder calculi  Stomach/Bowel: The stomach, small bowel, appendix, cecum are normal. Colon rectosigmoid colon are normal.  Vascular/Lymphatic: Abdominal aorta is normal caliber. There small periaortic lymph nodes left aorta measuring up to 10 mm short axis (image 39).  Reproductive: The uterus and ovaries are normal. The left gonadal vein is prominent  Musculoskeletal: No aggressive osseous lesion.  Other: No free fluid in the pelvis.  IMPRESSION: 1. Obstructing calculus within the proximal left ureter with mild hydronephrosis and renal edema. 2. Debris within the left renal pelvis and small locule of gas are consistent with pyonephrosis superimposed on hydronephrosis. Difficult to evaluate for pyelonephritis without IV contrast 3. Hepatic steatosis.   Electronically Signed   By: Suzy Bouchard M.D.   On: 03/22/2014 07:57   Dg Cystogram  03/22/2014   CLINICAL DATA:  Retrograde cystogram for stones with infection.  EXAM: INTRAOPERATIVE left RETROGRADE UROGRAPHY  TECHNIQUE: Images were obtained with the C-arm fluoroscopic device intraoperatively and submitted for interpretation post-operatively. Please see the procedural report for the amount of contrast and the fluoroscopy time utilized.  COMPARISON:  CT abdomen and pelvis 03/21/2014  FINDINGS: Intraoperative fluoroscopy is utilized for left retrograde pyelogram. Fluoroscopy time is not recorded.  Spot fluoroscopic images of the abdomen and pelvis demonstrate retrograde injection of contrast material into the left ureter. Two rounded filling defects are initially noted in the lower ureter. These may represent stones or air bubbles. The contrast column extends up to the level of the proximal left ureter without contrast demonstrated in the collecting system initially. Subsequent image demonstrates placement of a left  ureteral stent with contrast material demonstrated in what appears to be a dilated or deformed calyceal system.  IMPRESSION: Left retrograde pyelogram demonstrating filling defects in the distal left ureter and present obstruction at the proximal ureter. Subsequent placement of left ureteral stent.   Electronically Signed   By: Lucienne Capers M.D.   On: 03/22/2014 21:59   US Renal  03/21/2014   CLINICAL DATA:  Acute kidney injury.  Left flank pain and hematuria.  EXAM: RENAL/URINARY TRACT ULTRASOUND COMPLETE  COMPARISON:  08/06/2012  FINDINGS: Right Kidney:  Length: 13.6 cm.  No hydronephrosis.  Left Kidney:  Length: 15.6 cm. Moderate left-sided hydronephrosis. Apparent complexity within the collecting system, including on image 32.  Bladder:  Partially decompressed.  IMPRESSION: 1. Moderate left-sided hydronephrosis. This may relate to distal obstruction secondary to a stone or other process. Apparent echogenicity within the dilated left renal collecting system could be artifactual (given patient morbid obesity). Cannot exclude pyonephrosis. CT of the abdomen and pelvis (either stone study or contrast-enhanced CT) may be informative. These results were called by telephone at the time of interpretation on 03/21/2014 at 9:58 pm to the patient's nurse, Threasa Beards who verbally acknowledged these results. 2. Normal appearance of the right kidney.   Electronically Signed   By: Abigail Miyamoto M.D.   On: 03/21/2014 21:59    Anti-infectives: Anti-infectives    Start     Dose/Rate Route Frequency Ordered Stop   03/24/14 1000  azithromycin (ZITHROMAX) tablet 500  mg     500 mg Oral Daily 03/23/14 1721     03/23/14 1800  cephALEXin (KEFLEX) capsule 500 mg     500 mg Oral 4 times per day 03/23/14 1721     03/21/14 1930  fluconazole (DIFLUCAN) tablet 150 mg     150 mg Oral  Once 03/21/14 1834 03/21/14 2032   03/20/14 2200  cefTRIAXone (ROCEPHIN) 1 g in dextrose 5 % 50 mL IVPB - Premix  Status:  Discontinued     1 g100  mL/hr over 30 Minutes Intravenous Every 24 hours 03/20/14 0449 03/23/14 1721   03/20/14 2200  azithromycin (ZITHROMAX) 500 mg in dextrose 5 % 250 mL IVPB  Status:  Discontinued     500 mg250 mL/hr over 60 Minutes Intravenous Every 24 hours 03/20/14 0449 03/23/14 1721   03/20/14 0145  cefTRIAXone (ROCEPHIN) 1 g in dextrose 5 % 50 mL IVPB     1 g100 mL/hr over 30 Minutes Intravenous  Once 03/20/14 0134 03/20/14 0300   03/20/14 0145  azithromycin (ZITHROMAX) 500 mg in dextrose 5 % 250 mL IVPB     500 mg250 mL/hr over 60 Minutes Intravenous  Once 03/20/14 0134 03/20/14 8366      Assessment/Plan:  1 - Left Ureteral Stone - now s/p stenting as temporizing measure until infection completely resolved. Will need lithotripsy or ureteroscopy in elective setting in few weeks. We will arrange urol follow-up as outpatient to discuss further.   2 - Urosepsis, Pyonephrosis - improving infections parameters agree with transition to PO ABX with double coverage, preferably 14day or more course.   3 - Acute Renal Failure - improving with renal decompression  4 - Will follow prn while in house. Please call me directly if any questions arise.     [facility], Guiseppe Flanagan 03/23/2014

## 2014-03-23 NOTE — Evaluation (Signed)
Physical Therapy Evaluation Patient Details Name: [name] MRN: [id number] DOB: 07/21/1955 Today's Date: 03/23/2014   History of Present Illness  Pt admit with sepsis.  Cystoscopy while here in hospital.    Clinical Impression  Pt admitted with above diagnosis. Pt currently with functional limitations due to the deficits listed below (see PT Problem List). Pt doing well overall but needs to incr mobility while in hospital to maintain strength.  Will follow to maintain mobility in hospital but do not feel pt will need PT at home.  Has RW for use at all times at home and pt agreed.  Pt will benefit from skilled PT to increase their independence and safety with mobility to allow discharge to the venue listed below.      Follow Up Recommendations No PT follow up    Equipment Recommendations  None recommended by PT    Recommendations for Other Services       Precautions / Restrictions Precautions Precautions: None Restrictions Weight Bearing Restrictions: No      Mobility  Bed Mobility Overal bed mobility: Independent                Transfers Overall transfer level: Independent Equipment used: Rolling walker (2 wheeled)                Ambulation/Gait Ambulation/Gait assistance: Modified independent (Device/Increase time) Ambulation Distance (Feet): 250 Feet Assistive device: Rolling walker (2 wheeled) Gait Pattern/deviations: Step-through pattern;Decreased stride length;Trunk flexed   Gait velocity interpretation: Below normal speed for age/gender General Gait Details: Pt able to ambulate with RW without LOB with minimal challenges.    Stairs            Wheelchair Mobility    Modified Rankin (Stroke Patients Only)       Balance Overall balance assessment: Needs assistance Sitting-balance support: No upper extremity supported;Feet supported Sitting balance-Leahy Scale: Good     Standing balance support: Bilateral upper extremity  supported;During functional activity Standing balance-Leahy Scale: Fair Standing balance comment: Can stand statically without UE support and without LOB.               High level balance activites: Direction changes;Turns;Sudden stops High Level Balance Comments: Has no difficulty with above challenges with RW.               Pertinent Vitals/Pain Pain Assessment: No/denies pain  VSS    Home Living Family/patient expects to be discharged to:: Private residence Living Arrangements: Spouse/significant other;Children Available Help at Discharge: Family;Available 24 hours/day Type of Home: House Home Access: Stairs to enter Entrance Stairs-Rails: None Entrance Stairs-Number of Steps: 2 Home Layout: Multi-level Home Equipment: Cane - single point;Bedside commode;Shower seat;Walker - 2 wheels;Wheelchair - manual      Prior Function Level of Independence: Independent with assistive device(s)         Comments: used cane at all times     Hand Dominance   Dominant Hand: Right    Extremity/Trunk Assessment   Upper Extremity Assessment: Defer to OT evaluation           Lower Extremity Assessment: Generalized weakness      Cervical / Trunk Assessment: Kyphotic  Communication   Communication: No difficulties  Cognition Arousal/Alertness: Awake/alert Behavior During Therapy: WFL for tasks assessed/performed Overall Cognitive Status: Within Functional Limits for tasks assessed                      General Comments  Exercises        Assessment/Plan    PT Assessment Patient needs continued PT services  PT Diagnosis Generalized weakness   PT Problem List Decreased activity tolerance;Decreased balance;Decreased mobility;Decreased knowledge of use of DME;Decreased safety awareness;Decreased knowledge of precautions  PT Treatment Interventions DME instruction;Gait training;Functional mobility training;Therapeutic activities;Therapeutic  exercise;Balance training;Patient/family education   PT Goals (Current goals can be found in the Care Plan section) Acute Rehab PT Goals Patient Stated Goal: to go home PT Goal Formulation: With patient Time For Goal Achievement: 03/30/14 Potential to Achieve Goals: Good    Frequency Min 3X/week   Barriers to discharge        Co-evaluation               End of Session Equipment Utilized During Treatment: Gait belt Activity Tolerance: Patient tolerated treatment well Patient left: in bed;with call bell/phone within reach Nurse Communication: Mobility status         Time: 0937-1002 PT Time Calculation (min) (ACUTE ONLY): 25 min   Charges:   PT Evaluation $Initial PT Evaluation Tier I: 1 Procedure PT Treatments $Gait Training: 8-22 mins   PT G CodesDenice Paradise 04-02-2014, 1:21 PM Freddye Cardamone,PT Acute Rehabilitation [phone] (pager)

## 2014-03-23 NOTE — Evaluation (Signed)
Occupational Therapy Evaluation Patient Details Name: [name] MRN: [id number] DOB: [birth year] Today's Date: 03/23/2014    History of Present Illness Pt admit with sepsis.  Cystoscopy while here in hospital.     Clinical Impression   Pt is performing ADL and ADL transfers at a modified independent level.  No further OT needs.    Follow Up Recommendations  No OT follow up    Equipment Recommendations  None recommended by OT    Recommendations for Other Services       Precautions / Restrictions Precautions Precautions: None Restrictions Weight Bearing Restrictions: No      Mobility Bed Mobility Overal bed mobility: Independent                Transfers Overall transfer level: Independent Equipment used: None                  Balance Overall balance assessment: Needs assistance Sitting-balance support: No upper extremity supported;Feet supported Sitting balance-Leahy Scale: Good     Standing balance support: Bilateral upper extremity supported;During functional activity Standing balance-Leahy Scale: Fair Standing balance comment: Can stand statically without UE support and without LOB.               High level balance activites: Direction changes;Turns;Sudden stops High Level Balance Comments: Has no difficulty with above challenges with RW.              ADL Overall ADL's : Modified independent                                       General ADL Comments: Issued AE and practiced use with pt.     Vision                     Perception     Praxis      Pertinent Vitals/Pain Pain Assessment: No/denies pain     Hand Dominance Right   Extremity/Trunk Assessment Upper Extremity Assessment Upper Extremity Assessment: Overall WFL for tasks assessed   Lower Extremity Assessment Lower Extremity Assessment: Defer to PT evaluation   Cervical / Trunk Assessment Cervical / Trunk Assessment: Kyphotic    Communication Communication Communication: No difficulties   Cognition Arousal/Alertness: Awake/alert Behavior During Therapy: WFL for tasks assessed/performed Overall Cognitive Status: Within Functional Limits for tasks assessed                     General Comments       Exercises       Shoulder Instructions      Home Living Family/patient expects to be discharged to:: Private residence Living Arrangements: Spouse/significant other;Children Available Help at Discharge: Family;Available 24 hours/day Type of Home: House Home Access: Stairs to enter [employer] of Steps: 2 Entrance Stairs-Rails: None Home Layout: Multi-level Alternate Level Stairs-Number of Steps: 1 Alternate Level Stairs-Rails: None Bathroom Shower/Tub: Teacher, early years/pre: Standard     Home Equipment: Cane - single point;Bedside commode;Shower seat;Walker - 2 wheels;Wheelchair - manual;Grab bars - tub/shower;Grab bars - toilet          Prior Functioning/Environment Level of Independence: Independent with assistive device(s) (son sometimes assists with tub transfer and socks)        Comments: used cane at all times    OT Diagnosis:     OT Problem List:     OT Treatment/Interventions:  OT Goals(Current goals can be found in the care plan section) Acute Rehab OT Goals Patient Stated Goal: to go home  OT Frequency:     Barriers to D/C:            Co-evaluation              End of Session    Activity Tolerance: Patient tolerated treatment well Patient left: in bed;with call bell/phone within reach   Time: 1330-1400 OT Time Calculation (min): 30 min Charges:  OT General Charges $OT Visit: 1 Procedure OT Evaluation $Initial OT Evaluation Tier I: 1 Procedure OT Treatments $Self Care/Home Management : 8-22 mins G-Codes:    Malka So 03/23/2014, 2:07 PM  [phone]

## 2014-03-23 NOTE — Progress Notes (Signed)
PROGRESS NOTE  [name] YKZ:[id number] DOB: 1955/09/13 DOA: 03/20/2014 PCP: No PCP Per Patient  Brief history 58 year old female with a history of diabetes mellitus, hypertension, COPD, depression presented with shortness of breath, coughing, and fever. The patient was initially placed on an insulin drip for serum glucose of 565. She was transitioned to subcutaneous insulin. Workup has revealed sepsis secondary to pneumonia and pyonephrosis. The patient was initially treated with ceftriaxone and azithromycin. She continued to have persistent fever. As a result, CT of the abdomen and pelvis was obtained and revealed an obstructive left ureteral stone as well as left renal pelvis pyonephrosis.  Urology, Dr. Tresa Moore, was consulted.  He took her to surgery on 03/22/14 and a ureteral stent was placed.  Assessment/Plan: Sepsis secondary to pyonephrosis/UTI/pneumonia This was present at the time of admission. Improved with IV fluids. Continue current antibiotics. Influenza PCR--negative. f/u HIV--neg. . WBC trending down. Blood cultures negative. Urine cultures grew Citrobacter.  CAP Improved. Can be transitioned soon to oral antibiotics. Urine strep pneumo antigen negative. Urine Legionella--negative.  Obstructive L-ureteral stone/Pyonephrosis Patient underwent ureteral stent placement on 1/26. More cultures were sent which are pending. We'll continue IV antibiotics for now. Urology following. 03/20/2014 urine culture--pansensitive Citrobacter.   AKI Secondary to sepsis, volume depletion and obstructive uropathy. Renal ultrasound--left hydronephrosis. Now, she status post stent placement to the ureter. Renal function slowly improving. Baseline creatinine 0.7-1.0  Diabetes mellitus type 2 Has not taken any medications in 4 months. Hemoglobin A1c--11.1. NovoLog sliding scale. CBGs remain elevated. Will increase dose of Lantus today.  Essential Hypertension Has been unable to afford her  medications x 4 months. Restarted amlodipine as the patient's blood pressure was increasing. Blood pressures improved.  COPD Continues to smoke. Tobacco cessation discussed.   Chronic pain syndrome Pt follows Dr. London Pepper. Continue lyrica and Suboxone  DVT prophylaxis: Lovenox CODE STATUS: Full code Family Communication:Pt at beside Disposition Plan:Home when medically stable. Hopefully discharge in the next 24-48 hours.    Procedures/Studies: Ct Abdomen Pelvis Wo Contrast  03/22/2014   CLINICAL DATA:  Urinary tract infection, bilateral lower quadrant pain. Mild leukocytosis. Onset 1 day prior.  EXAM: CT ABDOMEN AND PELVIS WITHOUT CONTRAST  TECHNIQUE: Multidetector CT imaging of the abdomen and pelvis was performed following the standard protocol without IV contrast.  COMPARISON:  Ultrasound 03/21/2014  FINDINGS: Lower chest:  Lung bases are clear.  Hepatobiliary: No focal hepatic lesion on this noncontrast exam. Low-attenuation liver system hepatic steatosis. Patient status post cholecystectomy.  Pancreas: Pancreas is normal. No ductal dilatation. No pancreatic inflammation.  Spleen: Normal spleen.  Adrenals/urinary tract: Adrenal glands are normal.  The left kidney is edematous. There is mild hydronephrosis of the left renal collecting system. There is hydronephrosis secondary to obstructing stone in the proximal left ureter . This calculus measures 7 mm (image 48, series 2) at the L3 vertebral body level and is faintly seen on the CT tomogram.  There is high-density material within the left renal pelvis cysts with hemorrhage or debris. There is fluid along the margins of the anterior posterior para renal fascia on the left. There is a tiny gas pockets within the left renal collecting system on image 31, series 2. No distal ureteral calculi. No calculi within the right kidney. No bladder calculi  Stomach/Bowel: The stomach, small bowel, appendix, cecum are normal. Colon rectosigmoid colon are  normal.  Vascular/Lymphatic: Abdominal aorta is normal caliber. There small periaortic lymph nodes left  aorta measuring up to 10 mm short axis (image 39).  Reproductive: The uterus and ovaries are normal. The left gonadal vein is prominent  Musculoskeletal: No aggressive osseous lesion.  Other: No free fluid in the pelvis.  IMPRESSION: 1. Obstructing calculus within the proximal left ureter with mild hydronephrosis and renal edema. 2. Debris within the left renal pelvis and small locule of gas are consistent with pyonephrosis superimposed on hydronephrosis. Difficult to evaluate for pyelonephritis without IV contrast 3. Hepatic steatosis.   Electronically Signed   By: Suzy Bouchard M.D.   On: 03/22/2014 07:57   Dg Cystogram  03/22/2014   CLINICAL DATA:  Retrograde cystogram for stones with infection.  EXAM: INTRAOPERATIVE left RETROGRADE UROGRAPHY  TECHNIQUE: Images were obtained with the C-arm fluoroscopic device intraoperatively and submitted for interpretation post-operatively. Please see the procedural report for the amount of contrast and the fluoroscopy time utilized.  COMPARISON:  CT abdomen and pelvis 03/21/2014  FINDINGS: Intraoperative fluoroscopy is utilized for left retrograde pyelogram. Fluoroscopy time is not recorded.  Spot fluoroscopic images of the abdomen and pelvis demonstrate retrograde injection of contrast material into the left ureter. Two rounded filling defects are initially noted in the lower ureter. These may represent stones or air bubbles. The contrast column extends up to the level of the proximal left ureter without contrast demonstrated in the collecting system initially. Subsequent image demonstrates placement of a left ureteral stent with contrast material demonstrated in what appears to be a dilated or deformed calyceal system.  IMPRESSION: Left retrograde pyelogram demonstrating filling defects in the distal left ureter and present obstruction at the proximal ureter.  Subsequent placement of left ureteral stent.   Electronically Signed   By: Lucienne Capers M.D.   On: 03/22/2014 21:59   US Renal  03/21/2014   CLINICAL DATA:  Acute kidney injury.  Left flank pain and hematuria.  EXAM: RENAL/URINARY TRACT ULTRASOUND COMPLETE  COMPARISON:  08/06/2012  FINDINGS: Right Kidney:  Length: 13.6 cm.  No hydronephrosis.  Left Kidney:  Length: 15.6 cm. Moderate left-sided hydronephrosis. Apparent complexity within the collecting system, including on image 32.  Bladder:  Partially decompressed.  IMPRESSION: 1. Moderate left-sided hydronephrosis. This may relate to distal obstruction secondary to a stone or other process. Apparent echogenicity within the dilated left renal collecting system could be artifactual (given patient morbid obesity). Cannot exclude pyonephrosis. CT of the abdomen and pelvis (either stone study or contrast-enhanced CT) may be informative. These results were called by telephone at the time of interpretation on 03/21/2014 at 9:58 pm to the patient's nurse, Threasa Beards who verbally acknowledged these results. 2. Normal appearance of the right kidney.   Electronically Signed   By: Abigail Miyamoto M.D.   On: 03/21/2014 21:59   Dg Chest Port 1 View  03/20/2014   CLINICAL DATA:  Subacute onset of shortness of breath for 2 weeks. Cough, dizziness, congestion and confusion. Generalized weakness. Initial encounter.  EXAM: PORTABLE CHEST - 1 VIEW  COMPARISON:  Chest radiograph performed 08/03/2013  FINDINGS: The lungs are well-aerated. Minimal left basilar opacity likely reflects atelectasis. There is no evidence of pleural effusion or pneumothorax.  The cardiomediastinal silhouette is within normal limits. No acute osseous abnormalities are seen.  IMPRESSION: Minimal left basilar opacity likely reflects atelectasis; lungs otherwise clear.   Electronically Signed   By: Garald Balding M.D.   On: 03/20/2014 02:10      Subjective: Patient feels better this morning. Still has  some pain in her flank area.  No nausea, vomiting.   Objective: Filed Vitals:   03/22/14 1840 03/22/14 1950 03/23/14 0336 03/23/14 0758  BP: 164/92 155/74 149/77 103/87  Pulse: 116 114 102 96  Temp: 98.7 F (37.1 C) 98.6 F (37 C) 99.8 F (37.7 C) 98.6 F (37 C)  TempSrc: Oral Oral Oral Oral  Resp: _0 Height:      Weight:      SpO2: 96% 93% 94% 96%    Intake/Output Summary (Last 24 hours) at 03/23/14 0838 Last data filed at 03/23/14 1610  Gross per 24 hour  Intake   3260 ml  Output   4050 ml  Net   -790 ml   Exam:   General:  Pt is alert, in no distress.   HEENT: No icterus, No thrush, [place]/AT  Cardiovascular: RRR, S1/S2, no rubs, no murmur  Respiratory: Few crackles at the bases. No wheezing, no rhonchi.  Abdomen: Soft/+BS, left lower quadrant tenderness without any rebound, non distended, no guarding  Extremities: 1+LE edema, No lymphangitis, No petechiae, No rashes, no synovitis  Data Reviewed: Basic Metabolic Panel:  Recent Labs Lab 03/20/14 0138 03/20/14 1133 03/21/14 0535 03/22/14 0545 03/23/14 0601  NA 127* 131* 132* 133* 132*  K 3.7 4.2 3.9 4.2 4.5  CL 93* 97 98 100 99  CO2 _1 GLUCOSE 465* 183* 387* 321* 272*  BUN _2 CREATININE 1.41* 1.33* 1.55* 1.46* 1.35*  CALCIUM 8.6 8.6 8.1* 8.4 8.1*   Liver Function Tests:  Recent Labs Lab 03/20/14 0138  AST 13  ALT 15  ALKPHOS 122*  BILITOT 0.6  PROT 7.2  ALBUMIN 2.6*   CBC:  Recent Labs Lab 03/20/14 0138 03/21/14 0535 03/22/14 0545 03/23/14 0601  WBC 14.9* 11.7* 11.5* 9.8  NEUTROABS 13.1*  --   --   --   HGB 13.3 12.2 11.1* 11.0*  HCT 37.3 36.1 34.2* 33.5*  MCV 80.6 83.0 85.3 85.2  PLT 340 320 362 394   Cardiac Enzymes:  Recent Labs Lab 03/20/14 0542 03/22/14 0545  CKTOTAL  --  22  TROPONINI 0.03  --    CBG:  Recent Labs Lab 03/22/14 1137 03/22/14 1557 03/22/14 1743 03/22/14 2109 03/23/14 0623  GLUCAP 261* 226* 236* 358* 215*     Recent Results (from the past 240 hour(s))  Blood Culture (routine x 2)     Status: None (Preliminary result)   Collection Time: 03/20/14  1:38 AM  Result Value Ref Range Status   Specimen Description BLOOD RIGHT ARM  Final   Special Requests BOTTLES DRAWN AEROBIC AND ANAEROBIC 5CC EA  Final   Culture   Final           BLOOD CULTURE RECEIVED NO GROWTH TO DATE CULTURE WILL BE HELD FOR 5 DAYS BEFORE ISSUING A FINAL NEGATIVE REPORT Performed at [facility]    Report Status PENDING  Incomplete  Blood Culture (routine x 2)     Status: None (Preliminary result)   Collection Time: 03/20/14  1:45 AM  Result Value Ref Range Status   Specimen Description BLOOD LEFT ARM  Final   Special Requests BOTTLES DRAWN AEROBIC AND ANAEROBIC 5CC EA  Final   Culture   Final           BLOOD CULTURE RECEIVED NO GROWTH TO DATE CULTURE WILL BE HELD FOR 5 DAYS BEFORE ISSUING A FINAL NEGATIVE REPORT Performed at [facility]    Report Status  PENDING  Incomplete  Urine culture     Status: None   Collection Time: 03/20/14  2:37 AM  Result Value Ref Range Status   Specimen Description URINE, CATHETERIZED  Final   Special Requests NONE  Final   Colony Count   Final    70,000 COLONIES/ML Performed at [facility]    Culture   Final    CITROBACTER KOSERI Performed at [facility]    Report Status 03/22/2014 FINAL  Final   Organism ID, Bacteria CITROBACTER KOSERI  Final      Susceptibility   Citrobacter koseri - MIC*    CEFAZOLIN <=4 SENSITIVE Sensitive     CEFTRIAXONE <=1 SENSITIVE Sensitive     CIPROFLOXACIN <=0.25 SENSITIVE Sensitive     GENTAMICIN <=1 SENSITIVE Sensitive     LEVOFLOXACIN <=0.12 SENSITIVE Sensitive     NITROFURANTOIN 32 SENSITIVE Sensitive     TOBRAMYCIN <=1 SENSITIVE Sensitive     TRIMETH/SULFA <=20 SENSITIVE Sensitive     PIP/TAZO <=4 SENSITIVE Sensitive     * CITROBACTER KOSERI  Culture, sputum-assessment     Status: None   Collection Time:  03/20/14  6:51 AM  Result Value Ref Range Status   Specimen Description SPUTUM  Final   Special Requests NONE  Final   Sputum evaluation   Final    MICROSCOPIC FINDINGS SUGGEST THAT THIS SPECIMEN IS NOT REPRESENTATIVE OF LOWER RESPIRATORY SECRETIONS. PLEASE RECOLLECT. RESULT CALLED TO, READ BACK BY AND VERIFIED WITH: Theotis Barrio 03/20/14 AT 0904 BY RHOLMES   Report Status 03/20/2014 FINAL  Final  Surgical pcr screen     Status: None   Collection Time: 03/22/14  3:49 PM  Result Value Ref Range Status   MRSA, PCR NEGATIVE NEGATIVE Final   Staphylococcus aureus NEGATIVE NEGATIVE Final    Comment:        The Xpert SA Assay (FDA approved for NASAL specimens in patients over 36 years of age), is one component of a comprehensive surveillance program.  Test performance has been validated by [facility] for patients greater than or equal to 9 year old. It is not intended to diagnose infection nor to guide or monitor treatment.   Gram stain     Status: None   Collection Time: 03/22/14  5:04 PM  Result Value Ref Range Status   Specimen Description URINE, CATHETERIZED  Final   Special Requests LEFT KIDNEY  Final   Gram Stain   Final    DIRECT SMEAR ABUNDANT WBC PRESENT,BOTH PMN AND MONONUCLEAR RARE GRAM NEGATIVE RODS    Report Status 03/22/2014 FINAL  Final     Scheduled Meds: . amLODipine  10 mg Oral Daily  . azithromycin  500 mg Intravenous Q24H  . buprenorphine  8 mg Sublingual BID  . cefTRIAXone (ROCEPHIN)  IV  1 g Intravenous Q24H  . enoxaparin (LOVENOX) injection  40 mg Subcutaneous Q24H  . Influenza vac split quadrivalent PF  0.5 mL Intramuscular Tomorrow-1000  . insulin aspart  0-15 Units Subcutaneous TID WC  . insulin aspart  0-5 Units Subcutaneous QHS  . insulin aspart  4 Units Subcutaneous TID WC  . insulin glargine  25 Units Subcutaneous Daily  . insulin starter kit- pen needles  1 kit Other Once  . pregabalin  75 mg Oral QID  . sodium chloride  3 mL Intravenous  Q12H   Continuous Infusions: . sodium chloride 10 mL/hr at 03/22/14 1608  . sodium chloride 0.9 % 1,000 mL with potassium chloride 20  mEq infusion 100 mL/hr at 03/23/14 0159     [facility] Pager [phone]  If 7PM-7AM, please contact night-coverage [email or web address] Password [email or web address] 03/23/2014, 8:38 AM   LOS: 3 days

## 2014-03-23 NOTE — Op Note (Signed)
NAMEERNESTINA, JOE NO.:  [id number]  MEDICAL RECORD NO.:  [id number]  LOCATION:  2W37C                        FACILITY:  [facility]  PHYSICIAN:  Alexis Frock, MD     DATE OF BIRTH:  [birth year]  DATE OF PROCEDURE: 03/22/2014  DATE OF DISCHARGE:                              OPERATIVE REPORT   DIAGNOSES:  Left ureteral stone, urosepsis and acute renal failure.  PROCEDURES: 1. Cystoscopy with left retrograde pyelogram and interpretation. 2. Insertion of left ureteral stent, 6 x 26, no tether.  FINDINGS: 1. Hydronephrosis with filling defect in the proximal ureter     consistent with known stone. 2. Significant thick purulent urine in the left renal pelvis, sample     set aside for Gram stain and culture. 3. Unremarkable urinary bladder.  STENT:  6 x 26, Contour, no tether.  INDICATIONS:  Ms. Nikolic is a pleasant 59 year old lady with history of a poorly-compliant diabetes.  She was found on workup of sepsis and flank pain and acute renal failure to have a left proximal ureteral stone with hydronephrosis.  She was admitted to the [facility] Int[facility] Unit presently.  When it became evident, she had an obstructing stone, urgent urologic consultation was sought.  We evaluated the patient and agreed that urgent renal decompression was warranted with stent versus nephrostomy tube.  She wished to undergo trial of stenting. Informed consent was obtained, and placed in the medical record.  PROCEDURE IN DETAIL:  The patient being Jessic Riedlinger, was verified. Procedure being left ureteral stent placement was confirmed.  Procedure was carried out.  Time-out was performed.  Intravenous antibiotics were administered previously and verified.  General endotracheal anesthesia was introduced.  The patient was placed into a low lithotomy position and sterile field was created by prepping and draping the patient's vagina, introitus, and proximal thighs using iodine x3.   Next, cystourethroscopy was performed using a 22-French rigid cystoscope with 12-degree offset lens.  Inspection of the urinary bladder revealed some mild erythema, no diverticula, calcifications, papular lesions.  The left ureteral orifice was cannulated with a 5-French end-hole catheter and left retrograde pyelogram was obtained.  Left retrograde pyelogram demonstrated a single left ureter with filling defect in the proximal ureter consistent with known stone.  The open- ended catheter was advanced at this level and a 0.038 angled Zip wire was advanced above this and to the level of the renal pelvis.  The open- ended catheter to be advanced to the level of the renal pelvis. Additional very gentle retrograde pyelography revealed that this was indeed the dilated collecting system.  Aspiration revealed efflux of copious very thick purulent-appearing urine, sampled, this was set aside for Gram stain and culture.  The 0.038 Zip wire was advanced at the level of the upper pole and the open-ended catheter was exchanged for a new 6 x 26, Contour-type stating, and good proximal and distal deployment were noted and documented by x-ray.  Efflux of copious purulent-appearing urine was seen around and through the distal end of the stent.  Bladder was emptied per cystoscope.  Procedure was then terminated.  The patient tolerated the procedure well.  There  were no immediate periprocedural complications.  The patient was taken to the Postanesthesia Care Unit in stable condition.          ______________________________ Alexis Frock, MD     TM/MEDQ  D:  03/22/2014  T:  03/23/2014  Job:  [id number]

## 2014-03-24 ENCOUNTER — Encounter (HOSPITAL_COMMUNITY): Payer: Self-pay | Admitting: Urology

## 2014-03-24 LAB — COMPREHENSIVE METABOLIC PANEL
ALBUMIN: 2.3 g/dL — AB (ref 3.5–5.2)
ALT: 47 U/L — AB (ref 0–35)
AST: 55 U/L — ABNORMAL HIGH (ref 0–37)
Alkaline Phosphatase: 130 U/L — ABNORMAL HIGH (ref 39–117)
Anion gap: 7 (ref 5–15)
BUN: 16 mg/dL (ref 6–23)
CALCIUM: 8.4 mg/dL (ref 8.4–10.5)
CHLORIDE: 101 mmol/L (ref 96–112)
CO2: 26 mmol/L (ref 19–32)
Creatinine, Ser: 1.19 mg/dL — ABNORMAL HIGH (ref 0.50–1.10)
GFR calc Af Amer: 58 mL/min — ABNORMAL LOW (ref >90)
GFR calc non Af Amer: 50 mL/min — ABNORMAL LOW (ref >90)
Glucose, Bld: 156 mg/dL — ABNORMAL HIGH (ref 70–99)
POTASSIUM: 4.2 mmol/L (ref 3.5–5.1)
Sodium: 134 mmol/L — ABNORMAL LOW (ref 135–145)
Total Bilirubin: 0.4 mg/dL (ref 0.3–1.2)
Total Protein: 6.8 g/dL (ref 6.0–8.3)

## 2014-03-24 LAB — CBC
HEMATOCRIT: 36.6 % (ref 36.0–46.0)
HEMOGLOBIN: 12.1 g/dL (ref 12.0–15.0)
MCH: 28.5 pg (ref 26.0–34.0)
MCHC: 33.1 g/dL (ref 30.0–36.0)
MCV: 86.3 fL (ref 78.0–100.0)
PLATELETS: 493 10*3/uL — AB (ref 150–400)
RBC: 4.24 MIL/uL (ref 3.87–5.11)
RDW: 13.6 % (ref 11.5–15.5)
WBC: 9.6 10*3/uL (ref 4.0–10.5)

## 2014-03-24 LAB — GLUCOSE, CAPILLARY
GLUCOSE-CAPILLARY: 197 mg/dL — AB (ref 70–99)
Glucose-Capillary: 152 mg/dL — ABNORMAL HIGH (ref 70–99)

## 2014-03-24 MED ORDER — "PEN NEEDLES 3/16"" 31G X 5 MM MISC": Status: AC

## 2014-03-24 MED ORDER — INSULIN GLARGINE 100 UNIT/ML SOLOSTAR PEN: 34.0000 [IU] | PEN_INJECTOR | Freq: Every day | SUBCUTANEOUS | Status: DC

## 2014-03-24 MED ORDER — BLOOD GLUCOSE MONITOR KIT: PACK | Status: DC

## 2014-03-24 MED ORDER — AMLODIPINE BESYLATE 10 MG PO TABS: 10.0000 mg | ORAL_TABLET | Freq: Every day | ORAL | Status: DC

## 2014-03-24 MED ORDER — CEPHALEXIN 500 MG PO CAPS: 500.0000 mg | ORAL_CAPSULE | Freq: Four times a day (QID) | ORAL | Status: DC

## 2014-03-24 MED ORDER — INSULIN ASPART 100 UNIT/ML FLEXPEN: PEN_INJECTOR | SUBCUTANEOUS | Status: DC

## 2014-03-24 MED ORDER — AZITHROMYCIN 500 MG PO TABS: 500.0000 mg | ORAL_TABLET | Freq: Every day | ORAL | Status: DC

## 2014-03-24 NOTE — Progress Notes (Signed)
03/24/2014 2:09 PM Discharge AVS meds taken today and those due this evening reviewed.  Follow-up appointments and when to call md reviewed.  D/C IV and TELE.  Questions and concerns addressed.   D/C home per orders.[place]

## 2014-03-24 NOTE — Discharge Summary (Signed)
[facility]  Physician Discharge Summary   Patient ID: [name] MRN: [id number] DOB/AGE: 28-May-1955 59 y.o.  Admit date: 03/20/2014 Discharge date: 03/24/2014  PCP: [facility] [place]:  Active Problems:   Sepsis   COPD (chronic obstructive pulmonary disease)   Community acquired pneumonia   Diabetes mellitus type 2, uncontrolled   Acute renal failure syndrome   Urinary tract infection, bacterial   Acute pyonephrosis   Obstructive uropathy   RECOMMENDATIONS FOR OUTPATIENT FOLLOW UP: 1. Follow-up with PCP for management of diabetes 2. Urology to arrange follow-up. 3. Urine Cultures sent after her urological procedure is pending.  DISCHARGE CONDITION: fair  Diet recommendation: Mod Carb  Filed Weights   03/20/14 0209 03/20/14 0415  Weight: 124.739 kg (275 lb) 138.4 kg (305 lb 1.9 oz)    INITIAL HISTORY: 59 year old female with a history of diabetes mellitus, hypertension, COPD, depression presented with shortness of breath, coughing, and fever. The patient was initially placed on an insulin drip for serum glucose of 565. She was transitioned to subcutaneous insulin. Workup has revealed sepsis secondary to pneumonia and pyonephrosis. The patient was initially treated with ceftriaxone and azithromycin. She continued to have persistent fever. As a result, CT of the abdomen and pelvis was obtained and revealed an obstructive left ureteral stone as well as left renal pelvis pyonephrosis. Urology, Dr. Tresa Moore, was consulted. He took her to surgery on 03/22/14 and a ureteral stent was placed.  Consultations:  Urology  Procedures: Left ureteral stent placement 1/26  HOSPITAL COURSE:   Sepsis secondary to pyonephrosis/UTI/pneumonia This was present at the time of admission. Improved with IV fluids. She was started on IV antibiotics. Influenza PCR--negative. f/u HIV--neg. WBC was noted to be trending down. Blood cultures negative.  Urine cultures grew Citrobacter, which was pansensitive.  Community-acquired pneumonia She was started on antibiotics. She slowly improved. Urine strep pneumo antigen negative. Urine Legionella--negative.  Obstructive L-ureteral stone/Pyonephrosis Patient underwent ureteral stent placement on 1/26. More cultures were sent which are pending. She remained afebrile. Since there was any clinical improvement. She was transitioned to oral antibiotics. Urology will follow-up as an outpatient. Urine cultures grows back to her. She was transitioned to Keflex.   Acute kidney injury This was secondary to sepsis, volume depletion and obstructive uropathy. Renal ultrasound--left hydronephrosis. Now, she status post stent placement to the ureter. Renal function slowly improving. Baseline creatinine 0.7-1.0  Diabetes mellitus type 2 Patient has not taken any medications in 4 months. Hemoglobin A1c--11.1. She was initiated on Lantus. She was seen by diabetes coordinator. She'll be discharged on Lantus pen along with NovoLog. She has a follow-up appointment with her PCP. She was instructed to keep the log of all of her blood sugar readings.  Essential Hypertension Has been unable to afford her medications x 4 months. Restarted amlodipine as the patient's blood pressure was increasing. Blood pressures improved.  COPD Continues to smoke. Tobacco cessation discussed.   Chronic pain syndrome Pt follows Dr. London Pepper. Continue lyrica and Suboxone  Overall improved. Stable for discharge.  PERTINENT LABS:  The results of significant diagnostics from this hospitalization (including imaging, microbiology, ancillary and laboratory) are listed below for reference.    Microbiology: Recent Results (from the past 240 hour(s))  Blood Culture (routine x 2)     Status: None (Preliminary result)   Collection Time: 03/20/14  1:38 AM  Result Value Ref Range Status   Specimen Description BLOOD RIGHT ARM  Final   Special  Requests  BOTTLES DRAWN AEROBIC AND ANAEROBIC 5CC EA  Final   Culture   Final           BLOOD CULTURE RECEIVED NO GROWTH TO DATE CULTURE WILL BE HELD FOR 5 DAYS BEFORE ISSUING A FINAL NEGATIVE REPORT Performed at [facility]    Report Status PENDING  Incomplete  Blood Culture (routine x 2)     Status: None (Preliminary result)   Collection Time: 03/20/14  1:45 AM  Result Value Ref Range Status   Specimen Description BLOOD LEFT ARM  Final   Special Requests BOTTLES DRAWN AEROBIC AND ANAEROBIC 5CC EA  Final   Culture   Final           BLOOD CULTURE RECEIVED NO GROWTH TO DATE CULTURE WILL BE HELD FOR 5 DAYS BEFORE ISSUING A FINAL NEGATIVE REPORT Performed at [facility]    Report Status PENDING  Incomplete  Urine culture     Status: None   Collection Time: 03/20/14  2:37 AM  Result Value Ref Range Status   Specimen Description URINE, CATHETERIZED  Final   Special Requests NONE  Final   Colony Count   Final    70,000 COLONIES/ML Performed at [facility]    Culture   Final    CITROBACTER KOSERI Performed at [facility]    Report Status 03/22/2014 FINAL  Final   Organism ID, Bacteria CITROBACTER KOSERI  Final      Susceptibility   Citrobacter koseri - MIC*    CEFAZOLIN <=4 SENSITIVE Sensitive     CEFTRIAXONE <=1 SENSITIVE Sensitive     CIPROFLOXACIN <=0.25 SENSITIVE Sensitive     GENTAMICIN <=1 SENSITIVE Sensitive     LEVOFLOXACIN <=0.12 SENSITIVE Sensitive     NITROFURANTOIN 32 SENSITIVE Sensitive     TOBRAMYCIN <=1 SENSITIVE Sensitive     TRIMETH/SULFA <=20 SENSITIVE Sensitive     PIP/TAZO <=4 SENSITIVE Sensitive     * CITROBACTER KOSERI  Culture, sputum-assessment     Status: None   Collection Time: 03/20/14  6:51 AM  Result Value Ref Range Status   Specimen Description SPUTUM  Final   Special Requests NONE  Final   Sputum evaluation   Final    MICROSCOPIC FINDINGS SUGGEST THAT THIS SPECIMEN IS NOT REPRESENTATIVE OF LOWER RESPIRATORY  SECRETIONS. PLEASE RECOLLECT. RESULT CALLED TO, READ BACK BY AND VERIFIED WITH: Theotis Barrio 03/20/14 AT 0904 BY RHOLMES   Report Status 03/20/2014 FINAL  Final  Surgical pcr screen     Status: None   Collection Time: 03/22/14  3:49 PM  Result Value Ref Range Status   MRSA, PCR NEGATIVE NEGATIVE Final   Staphylococcus aureus NEGATIVE NEGATIVE Final    Comment:        The Xpert SA Assay (FDA approved for NASAL specimens in patients over 11 years of age), is one component of a comprehensive surveillance program.  Test performance has been validated by [facility] for patients greater than or equal to 22 year old. It is not intended to diagnose infection nor to guide or monitor treatment.   Urine culture     Status: None (Preliminary result)   Collection Time: 03/22/14  5:04 PM  Result Value Ref Range Status   Specimen Description URINE, CATHETERIZED  Final   Special Requests LEFT KIDNEY  Final   Colony Count   Final    >=100,000 COLONIES/ML Performed at [facility]   Final    GRAM NEGATIVE  RODS Performed at [facility]    Report Status PENDING  Incomplete  Gram stain     Status: None   Collection Time: 03/22/14  5:04 PM  Result Value Ref Range Status   Specimen Description URINE, CATHETERIZED  Final   Special Requests LEFT KIDNEY  Final   Gram Stain   Final    DIRECT SMEAR ABUNDANT WBC PRESENT,BOTH PMN AND MONONUCLEAR RARE GRAM NEGATIVE RODS    Report Status 03/22/2014 FINAL  Final     Labs: Basic Metabolic Panel:  Recent Labs Lab 03/20/14 1133 03/21/14 0535 03/22/14 0545 03/23/14 0601 03/24/14 0820  NA 131* 132* 133* 132* 134*  K 4.2 3.9 4.2 4.5 4.2  CL 97 98 100 99 101  CO2 _0 GLUCOSE 183* 387* 321* 272* 156*  BUN _1 CREATININE 1.33* 1.55* 1.46* 1.35* 1.19*  CALCIUM 8.6 8.1* 8.4 8.1* 8.4   Liver Function Tests:  Recent Labs Lab 03/20/14 0138 03/24/14 0820  AST 13 55*  ALT 15 47*  ALKPHOS  122* 130*  BILITOT 0.6 0.4  PROT 7.2 6.8  ALBUMIN 2.6* 2.3*   CBC:  Recent Labs Lab 03/20/14 0138 03/21/14 0535 03/22/14 0545 03/23/14 0601 03/24/14 0820  WBC 14.9* 11.7* 11.5* 9.8 9.6  NEUTROABS 13.1*  --   --   --   --   HGB 13.3 12.2 11.1* 11.0* 12.1  HCT 37.3 36.1 34.2* 33.5* 36.6  MCV 80.6 83.0 85.3 85.2 86.3  PLT 340 320 362 394 493*   Cardiac Enzymes:  Recent Labs Lab 03/20/14 0542 03/22/14 0545  CKTOTAL  --  22  TROPONINI 0.03  --    CBG:  Recent Labs Lab 03/23/14 1120 03/23/14 1619 03/23/14 2042 03/24/14 0643 03/24/14 1112  GLUCAP 298* 185* 178* 152* 197*     IMAGING STUDIES Ct Abdomen Pelvis Wo Contrast  03/22/2014   CLINICAL DATA:  Urinary tract infection, bilateral lower quadrant pain. Mild leukocytosis. Onset 1 day prior.  EXAM: CT ABDOMEN AND PELVIS WITHOUT CONTRAST  TECHNIQUE: Multidetector CT imaging of the abdomen and pelvis was performed following the standard protocol without IV contrast.  COMPARISON:  Ultrasound 03/21/2014  FINDINGS: Lower chest:  Lung bases are clear.  Hepatobiliary: No focal hepatic lesion on this noncontrast exam. Low-attenuation liver system hepatic steatosis. Patient status post cholecystectomy.  Pancreas: Pancreas is normal. No ductal dilatation. No pancreatic inflammation.  Spleen: Normal spleen.  Adrenals/urinary tract: Adrenal glands are normal.  The left kidney is edematous. There is mild hydronephrosis of the left renal collecting system. There is hydronephrosis secondary to obstructing stone in the proximal left ureter . This calculus measures 7 mm (image 48, series 2) at the L3 vertebral body level and is faintly seen on the CT tomogram.  There is high-density material within the left renal pelvis cysts with hemorrhage or debris. There is fluid along the margins of the anterior posterior para renal fascia on the left. There is a tiny gas pockets within the left renal collecting system on image 31, series 2. No distal  ureteral calculi. No calculi within the right kidney. No bladder calculi  Stomach/Bowel: The stomach, small bowel, appendix, cecum are normal. Colon rectosigmoid colon are normal.  Vascular/Lymphatic: Abdominal aorta is normal caliber. There small periaortic lymph nodes left aorta measuring up to 10 mm short axis (image 39).  Reproductive: The uterus and ovaries are normal. The left gonadal vein is prominent  Musculoskeletal: No aggressive osseous lesion.  Other: No free fluid in the pelvis.  IMPRESSION: 1. Obstructing calculus within the proximal left ureter with mild hydronephrosis and renal edema. 2. Debris within the left renal pelvis and small locule of gas are consistent with pyonephrosis superimposed on hydronephrosis. Difficult to evaluate for pyelonephritis without IV contrast 3. Hepatic steatosis.   Electronically Signed   By: Suzy Bouchard M.D.   On: 03/22/2014 07:57   Dg Cystogram  03/22/2014   CLINICAL DATA:  Retrograde cystogram for stones with infection.  EXAM: INTRAOPERATIVE left RETROGRADE UROGRAPHY  TECHNIQUE: Images were obtained with the C-arm fluoroscopic device intraoperatively and submitted for interpretation post-operatively. Please see the procedural report for the amount of contrast and the fluoroscopy time utilized.  COMPARISON:  CT abdomen and pelvis 03/21/2014  FINDINGS: Intraoperative fluoroscopy is utilized for left retrograde pyelogram. Fluoroscopy time is not recorded.  Spot fluoroscopic images of the abdomen and pelvis demonstrate retrograde injection of contrast material into the left ureter. Two rounded filling defects are initially noted in the lower ureter. These may represent stones or air bubbles. The contrast column extends up to the level of the proximal left ureter without contrast demonstrated in the collecting system initially. Subsequent image demonstrates placement of a left ureteral stent with contrast material demonstrated in what appears to be a dilated or  deformed calyceal system.  IMPRESSION: Left retrograde pyelogram demonstrating filling defects in the distal left ureter and present obstruction at the proximal ureter. Subsequent placement of left ureteral stent.   Electronically Signed   By: Lucienne Capers M.D.   On: 03/22/2014 21:59   US Renal  03/21/2014   CLINICAL DATA:  Acute kidney injury.  Left flank pain and hematuria.  EXAM: RENAL/URINARY TRACT ULTRASOUND COMPLETE  COMPARISON:  08/06/2012  FINDINGS: Right Kidney:  Length: 13.6 cm.  No hydronephrosis.  Left Kidney:  Length: 15.6 cm. Moderate left-sided hydronephrosis. Apparent complexity within the collecting system, including on image 32.  Bladder:  Partially decompressed.  IMPRESSION: 1. Moderate left-sided hydronephrosis. This may relate to distal obstruction secondary to a stone or other process. Apparent echogenicity within the dilated left renal collecting system could be artifactual (given patient morbid obesity). Cannot exclude pyonephrosis. CT of the abdomen and pelvis (either stone study or contrast-enhanced CT) may be informative. These results were called by telephone at the time of interpretation on 03/21/2014 at 9:58 pm to the patient's nurse, Threasa Beards who verbally acknowledged these results. 2. Normal appearance of the right kidney.   Electronically Signed   By: Abigail Miyamoto M.D.   On: 03/21/2014 21:59   Dg Chest Port 1 View  03/20/2014   CLINICAL DATA:  Subacute onset of shortness of breath for 2 weeks. Cough, dizziness, congestion and confusion. Generalized weakness. Initial encounter.  EXAM: PORTABLE CHEST - 1 VIEW  COMPARISON:  Chest radiograph performed 08/03/2013  FINDINGS: The lungs are well-aerated. Minimal left basilar opacity likely reflects atelectasis. There is no evidence of pleural effusion or pneumothorax.  The cardiomediastinal silhouette is within normal limits. No acute osseous abnormalities are seen.  IMPRESSION: Minimal left basilar opacity likely reflects  atelectasis; lungs otherwise clear.   Electronically Signed   By: Garald Balding M.D.   On: 03/20/2014 02:10    DISCHARGE EXAMINATION: Filed Vitals:   03/23/14 1408 03/23/14 2022 03/24/14 0527 03/24/14 1018  BP: 126/84 130/76 148/89 148/79  Pulse: 98 85 85 89  Temp: 98.7 F (37.1 C) 98.1 F (36.7 C) 97.8 F (36.6 C)   TempSrc: Oral Oral Oral  Resp: _0 Height:      Weight:      SpO2: 97% 95% 96%    General appearance: alert, cooperative, appears stated age and no distress Resp: clear to auscultation bilaterally Cardio: regular rate and rhythm, S1, S2 normal, no murmur, click, rub or gallop GI: soft, non-tender; bowel sounds normal; no masses,  no organomegaly Extremities: extremities normal, atraumatic, no cyanosis or edema   DISPOSITION: Home  Discharge Instructions    Call MD for:  persistant dizziness or light-headedness    Complete by:  As directed      Call MD for:  persistant nausea and vomiting    Complete by:  As directed      Call MD for:  severe uncontrolled pain    Complete by:  As directed      Call MD for:  temperature >100.4    Complete by:  As directed      Diet Carb Modified    Complete by:  As directed      Increase activity slowly    Complete by:  As directed            ALLERGIES:  Allergies  Allergen Reactions  . Ace Inhibitors Anaphylaxis      Current Discharge Medication List    START taking these medications   Details  azithromycin (ZITHROMAX) 500 MG tablet Take 1 tablet (500 mg total) by mouth daily. For 6 more days starting tomorrow Qty: 6 tablet, Refills: 0    blood glucose meter kit and supplies KIT Dispense based on patient and insurance preference. Use up to four times daily as directed. (FOR ICD-9 250.00, 250.01). Qty: 1 each, Refills: 0    cephALEXin (KEFLEX) 500 MG capsule Take 1 capsule (500 mg total) by mouth every 6 (six) hours. For 8 more days Qty: 32 capsule, Refills: 0    insulin aspart (NOVOLOG FLEXPEN) 100  UNIT/ML FlexPen Give yourself 3 units before each meal. Qty: 15 mL, Refills: 3    Insulin Glargine (LANTUS SOLOSTAR) 100 UNIT/ML Solostar Pen Inject 34 Units into the skin daily. At 10AM Qty: 15 mL, Refills: 3    Insulin Pen Needle (PEN NEEDLES 3/16") 31G X 5 MM MISC Use as directed. Qty: 100 each, Refills: 3      CONTINUE these medications which have CHANGED   Details  amLODipine (NORVASC) 10 MG tablet Take 1 tablet (10 mg total) by mouth daily. Qty: 30 tablet, Refills: 6      CONTINUE these medications which have NOT CHANGED   Details  albuterol (PROVENTIL HFA;VENTOLIN HFA) 108 (90 BASE) MCG/ACT inhaler Inhale 2 puffs into the lungs every 4 (four) hours as needed for wheezing or shortness of breath. Qty: 18 g, Refills: 1    buprenorphine-naloxone (SUBOXONE) 8-2 MG SUBL SL tablet Place 1 tablet under the tongue 2 (two) times daily.    cyclobenzaprine (FLEXERIL) 10 MG tablet Take 10 mg by mouth at bedtime as needed for muscle spasms.    ibuprofen (ADVIL,MOTRIN) 200 MG tablet Take 400-600 mg by mouth every 6 (six) hours as needed for fever, headache or moderate pain.     pregabalin (LYRICA) 75 MG capsule Take 75 mg by mouth 4 (four) times daily.      STOP taking these medications     glipiZIDE (GLUCOTROL) 5 MG tablet      nystatin (MYCOSTATIN) 100000 UNIT/ML suspension        Follow-up Information    Follow up with Family  Medicine at [facility].   Why:  appointment arranged for Apr 05, 2014 at 10:30 am    Contact information:   [address] [place], [place] [address]  Phone: [phone]       Follow up with Alexis Frock, MD.   Specialty:  Urology   Why:  his office will call for follow up regarding the ureteral stent   Contact information:   [place] [address] [phone]       TOTAL DISCHARGE TIME: 44 mins  [facility] Pager [phone]  03/24/2014, 12:39 PM

## 2014-03-25 LAB — URINE CULTURE: Colony Count: >=100000

## 2014-03-26 LAB — CULTURE, BLOOD (ROUTINE X 2)
CULTURE: NO GROWTH
CULTURE: NO GROWTH

## 2014-04-21 ENCOUNTER — Other Ambulatory Visit: Payer: Self-pay | Admitting: Urology

## 2014-05-04 ENCOUNTER — Encounter: Payer: Self-pay | Admitting: Obstetrics & Gynecology

## 2014-05-05 NOTE — Patient Instructions (Addendum)
[facility]y [facility] [place]  05/05/2014   Your procedure is scheduled on: 05/11/14    Report to [facility] Main  Entrance and follow signs to               [place] at 10:30 AM.   Call this number if you have problems the morning of surgery [phone]   Remember:  Do not eat food  :After Midnight.              MAY HAVE CLEAR LIQUIDS UNTIL 6:30 AM    CLEAR LIQUID DIET   Foods Allowed                                                                     Foods Excluded  Coffee and tea, regular and decaf                             liquids that you cannot  Plain Jell-O in any flavor                                             see through such as: Fruit ices (not with fruit pulp)                                     milk, soups, orange juice  Iced Popsicles                                                All solid food Carbonated beverages, regular and diet                                    Cranberry, grape and apple juices Sports drinks like Gatorade Lightly seasoned clear broth or consume(fat free) Sugar, honey syrup _____________________________________________________________________   Take these medicines the morning of surgery with A SIP OF WATER: AMLODIPINE / SUBOXONE / LYRICA / USE ALBUTEROL INHALER                               You may not have any metal on your body including hair pins and              piercings  Do not wear jewelry, make-up, lotions, powders or perfumes.             Do not wear nail polish.  Do not shave  48 hours prior to surgery.              Men may shave face and neck.   Do not bring valuables to the hospital. [facility]  FOR VALUABLES.  Contacts, dentures or bridgework may not be worn into surgery.  Leave suitcase in the car. After surgery it may be brought to your room.     Patients discharged the day of surgery will not be allowed to drive home.  Name and phone number of your  driver:  Special Instructions: N/A              Please read over the following fact sheets you were given: _____________________________________________________________________                                                     [place]  Before surgery, you can play an important role.  Because skin is not sterile, your skin needs to be as free of germs as possible.  You can reduce the number of germs on your skin by washing with CHG (chlorahexidine gluconate) soap before surgery.  CHG is an antiseptic cleaner which kills germs and bonds with the skin to continue killing germs even after washing. Please DO NOT use if you have an allergy to CHG or antibacterial soaps.  If your skin becomes reddened/irritated stop using the CHG and inform your nurse when you arrive at Short Stay. Do not shave (including legs and underarms) for at least 48 hours prior to the first CHG shower.  You may shave your face. Please follow these instructions carefully:   1.  Shower with CHG Soap the night before surgery and the  morning of Surgery.   2.  If you choose to wash your hair, wash your hair first as usual with your  normal  Shampoo.   3.  After you shampoo, rinse your hair and body thoroughly to remove the  shampoo.                                         4.  Use CHG as you would any other liquid soap.  You can apply chg directly  to the skin and wash . Gently wash with scrungie or clean wascloth    5.  Apply the CHG Soap to your body ONLY FROM THE NECK DOWN.   Do not use on open                           Wound or open sores. Avoid contact with eyes, ears mouth and genitals (private parts).                        Genitals (private parts) with your normal soap.              6.  Wash thoroughly, paying special attention to the area where your surgery  will be performed.   7.  Thoroughly rinse your body with warm water from the neck down.   8.  DO NOT shower/wash with your  normal soap after using and rinsing off  the CHG Soap .                9.  Pat yourself dry with a clean towel.             10.  Wear  clean pajamas.             11.  Place clean sheets on your bed the night of your first shower and do not  sleep with pets.  Day of Surgery : Do not apply any lotions/deodorants the morning of surgery.  Please wear clean clothes to the hospital/surgery center.  FAILURE TO FOLLOW THESE INSTRUCTIONS MAY RESULT IN THE CANCELLATION OF YOUR SURGERY    PATIENT SIGNATURE_________________________________  ______________________________________________________________________

## 2014-05-06 ENCOUNTER — Encounter (HOSPITAL_COMMUNITY)
Admission: RE | Admit: 2014-05-06 | Discharge: 2014-05-06 | Disposition: A | Discharge status: Home / Self Care | Payer: Medicaid Other | Source: Ambulatory Visit | Attending: Urology | Admitting: Urology

## 2014-05-06 ENCOUNTER — Encounter (HOSPITAL_COMMUNITY): Payer: Self-pay

## 2014-05-06 DIAGNOSIS — N201 Calculus of ureter: Secondary | ICD-10-CM | POA: Insufficient documentation

## 2014-05-06 DIAGNOSIS — Z01812 Encounter for preprocedural laboratory examination: Secondary | ICD-10-CM | POA: Insufficient documentation

## 2014-05-06 HISTORY — DX: Urge incontinence: N39.41

## 2014-05-06 HISTORY — DX: Rash and other nonspecific skin eruption: R21

## 2014-05-06 HISTORY — DX: Sleep apnea, unspecified: G47.30

## 2014-05-06 HISTORY — DX: Type 2 diabetes mellitus with diabetic neuropathy, unspecified: E11.40

## 2014-05-06 HISTORY — DX: Nocturia: R35.1

## 2014-05-06 HISTORY — DX: Calculus of kidney: N20.0

## 2014-05-06 LAB — BASIC METABOLIC PANEL
Anion gap: 10 (ref 5–15)
BUN: 15 mg/dL (ref 6–23)
CALCIUM: 9.4 mg/dL (ref 8.4–10.5)
CO2: 26 mmol/L (ref 19–32)
Chloride: 106 mmol/L (ref 96–112)
Creatinine, Ser: 1.26 mg/dL — ABNORMAL HIGH (ref 0.50–1.10)
GFR calc Af Amer: 54 mL/min — ABNORMAL LOW (ref >90)
GFR calc non Af Amer: 47 mL/min — ABNORMAL LOW (ref >90)
GLUCOSE: 130 mg/dL — AB (ref 70–99)
Potassium: 4 mmol/L (ref 3.5–5.1)
Sodium: 142 mmol/L (ref 135–145)

## 2014-05-06 LAB — CBC
HEMATOCRIT: 42.2 % (ref 36.0–46.0)
HEMOGLOBIN: 13.7 g/dL (ref 12.0–15.0)
MCH: 28.2 pg (ref 26.0–34.0)
MCHC: 32.5 g/dL (ref 30.0–36.0)
MCV: 86.8 fL (ref 78.0–100.0)
Platelets: 234 10*3/uL (ref 150–400)
RBC: 4.86 MIL/uL (ref 3.87–5.11)
RDW: 14 % (ref 11.5–15.5)
WBC: 7.6 10*3/uL (ref 4.0–10.5)

## 2014-05-06 NOTE — Progress Notes (Signed)
Abnormal BMET faxed to Dr. Tresa Moore

## 2014-05-10 MED ORDER — GENTAMICIN SULFATE 40 MG/ML IJ SOLN
5.0000 mg/kg | INTRAVENOUS | Status: AC
  Administered 2014-05-11: 410 mg via INTRAVENOUS
  Filled 2014-05-10: qty 10.25

## 2014-05-11 ENCOUNTER — Encounter (HOSPITAL_COMMUNITY)
Admission: RE | Disposition: A | Discharge status: Home / Self Care | Payer: Self-pay | Source: Ambulatory Visit | Attending: Urology

## 2014-05-11 ENCOUNTER — Ambulatory Visit (HOSPITAL_COMMUNITY)
Admission: RE | Admit: 2014-05-11 | Discharge: 2014-05-11 | Disposition: A | Discharge status: Home / Self Care | Payer: Medicaid Other | Source: Ambulatory Visit | Attending: Urology | Admitting: Urology

## 2014-05-11 ENCOUNTER — Ambulatory Visit (HOSPITAL_COMMUNITY): Payer: Medicaid Other | Admitting: Certified Registered Nurse Anesthetist

## 2014-05-11 ENCOUNTER — Encounter (HOSPITAL_COMMUNITY): Payer: Self-pay | Admitting: *Deleted

## 2014-05-11 DIAGNOSIS — I10 Essential (primary) hypertension: Secondary | ICD-10-CM | POA: Insufficient documentation

## 2014-05-11 DIAGNOSIS — Z6841 Body Mass Index (BMI) 40.0 and over, adult: Secondary | ICD-10-CM | POA: Insufficient documentation

## 2014-05-11 DIAGNOSIS — I739 Peripheral vascular disease, unspecified: Secondary | ICD-10-CM | POA: Insufficient documentation

## 2014-05-11 DIAGNOSIS — Z87891 Personal history of nicotine dependence: Secondary | ICD-10-CM | POA: Diagnosis not present

## 2014-05-11 DIAGNOSIS — Z8701 Personal history of pneumonia (recurrent): Secondary | ICD-10-CM | POA: Diagnosis not present

## 2014-05-11 DIAGNOSIS — M1990 Unspecified osteoarthritis, unspecified site: Secondary | ICD-10-CM | POA: Diagnosis not present

## 2014-05-11 DIAGNOSIS — G4730 Sleep apnea, unspecified: Secondary | ICD-10-CM | POA: Insufficient documentation

## 2014-05-11 DIAGNOSIS — J449 Chronic obstructive pulmonary disease, unspecified: Secondary | ICD-10-CM | POA: Diagnosis not present

## 2014-05-11 DIAGNOSIS — N136 Pyonephrosis: Secondary | ICD-10-CM | POA: Insufficient documentation

## 2014-05-11 DIAGNOSIS — E1140 Type 2 diabetes mellitus with diabetic neuropathy, unspecified: Secondary | ICD-10-CM | POA: Insufficient documentation

## 2014-05-11 DIAGNOSIS — R109 Unspecified abdominal pain: Secondary | ICD-10-CM | POA: Diagnosis present

## 2014-05-11 DIAGNOSIS — N201 Calculus of ureter: Secondary | ICD-10-CM | POA: Insufficient documentation

## 2014-05-11 DIAGNOSIS — Z9089 Acquired absence of other organs: Secondary | ICD-10-CM | POA: Diagnosis not present

## 2014-05-11 DIAGNOSIS — Z9889 Other specified postprocedural states: Secondary | ICD-10-CM | POA: Diagnosis not present

## 2014-05-11 HISTORY — PX: HOLMIUM LASER APPLICATION: SHX5852

## 2014-05-11 HISTORY — PX: CYSTOSCOPY WITH RETROGRADE PYELOGRAM, URETEROSCOPY AND STENT PLACEMENT: SHX5789

## 2014-05-11 LAB — GLUCOSE, CAPILLARY
Glucose-Capillary: 97 mg/dL (ref 70–99)
Glucose-Capillary: 106 mg/dL — ABNORMAL HIGH (ref 70–99)

## 2014-05-11 SURGERY — CYSTOURETEROSCOPY, WITH RETROGRADE PYELOGRAM AND STENT INSERTION
Anesthesia: General | Site: Ureter | Laterality: Left

## 2014-05-11 MED ORDER — SODIUM CHLORIDE 0.9 % IR SOLN
Status: DC | PRN
  Administered 2014-05-11: 3000 mL
  Administered 2014-05-11: 1000 mL

## 2014-05-11 MED ORDER — METOCLOPRAMIDE HCL 5 MG/ML IJ SOLN
INTRAMUSCULAR | Status: DC | PRN
  Administered 2014-05-11: 10 mg via INTRAVENOUS

## 2014-05-11 MED ORDER — LACTATED RINGERS IV SOLN
INTRAVENOUS | Status: DC | PRN
  Administered 2014-05-11: 12:00:00 via INTRAVENOUS

## 2014-05-11 MED ORDER — PROMETHAZINE HCL 25 MG/ML IJ SOLN
INTRAMUSCULAR | Status: AC
  Filled 2014-05-11: qty 1

## 2014-05-11 MED ORDER — LACTATED RINGERS IV SOLN: INTRAVENOUS | Status: DC

## 2014-05-11 MED ORDER — METOCLOPRAMIDE HCL 5 MG/ML IJ SOLN
INTRAMUSCULAR | Status: AC
  Filled 2014-05-11: qty 2

## 2014-05-11 MED ORDER — 0.9 % SODIUM CHLORIDE (POUR BTL) OPTIME
TOPICAL | Status: DC | PRN
  Administered 2014-05-11: 1000 mL

## 2014-05-11 MED ORDER — OXYCODONE-ACETAMINOPHEN 5-325 MG PO TABS: 1.0000 | ORAL_TABLET | Freq: Four times a day (QID) | ORAL | Status: DC | PRN

## 2014-05-11 MED ORDER — BELLADONNA ALKALOIDS-OPIUM 16.2-60 MG RE SUPP
RECTAL | Status: AC
  Filled 2014-05-11: qty 1

## 2014-05-11 MED ORDER — PROMETHAZINE HCL 25 MG/ML IJ SOLN
6.2500 mg | INTRAMUSCULAR | Status: DC | PRN
  Administered 2014-05-11: 6.25 mg via INTRAVENOUS

## 2014-05-11 MED ORDER — MIDAZOLAM HCL 5 MG/5ML IJ SOLN
INTRAMUSCULAR | Status: DC | PRN
  Administered 2014-05-11 (×2): 1 mg via INTRAVENOUS

## 2014-05-11 MED ORDER — CEPHALEXIN 500 MG PO CAPS: 500.0000 mg | ORAL_CAPSULE | Freq: Two times a day (BID) | ORAL | Status: DC

## 2014-05-11 MED ORDER — LIDOCAINE HCL 2 % EX GEL
CUTANEOUS | Status: AC
  Filled 2014-05-11: qty 10

## 2014-05-11 MED ORDER — LIDOCAINE HCL (CARDIAC) 20 MG/ML IV SOLN
INTRAVENOUS | Status: AC
  Filled 2014-05-11: qty 5

## 2014-05-11 MED ORDER — LIDOCAINE HCL (CARDIAC) 20 MG/ML IV SOLN
INTRAVENOUS | Status: DC | PRN
  Administered 2014-05-11: 50 mg via INTRAVENOUS
  Administered 2014-05-11: 100 mg via INTRAVENOUS

## 2014-05-11 MED ORDER — SENNOSIDES-DOCUSATE SODIUM 8.6-50 MG PO TABS: 1.0000 | ORAL_TABLET | Freq: Two times a day (BID) | ORAL | Status: DC

## 2014-05-11 MED ORDER — IOHEXOL 300 MG/ML  SOLN
INTRAMUSCULAR | Status: DC | PRN
  Administered 2014-05-11: 13 mL

## 2014-05-11 MED ORDER — OXYCODONE-ACETAMINOPHEN 5-325 MG PO TABS
1.0000 | ORAL_TABLET | ORAL | Status: DC | PRN
  Administered 2014-05-11: 1 via ORAL
  Filled 2014-05-11: qty 1

## 2014-05-11 MED ORDER — FENTANYL CITRATE 0.05 MG/ML IJ SOLN: 25.0000 ug | INTRAMUSCULAR | Status: DC | PRN

## 2014-05-11 MED ORDER — PROPOFOL 10 MG/ML IV BOLUS
INTRAVENOUS | Status: AC
  Filled 2014-05-11: qty 20

## 2014-05-11 MED ORDER — PROPOFOL 10 MG/ML IV BOLUS
INTRAVENOUS | Status: DC | PRN
  Administered 2014-05-11: 200 mg via INTRAVENOUS

## 2014-05-11 MED ORDER — ONDANSETRON HCL 4 MG/2ML IJ SOLN
INTRAMUSCULAR | Status: AC
  Filled 2014-05-11: qty 2

## 2014-05-11 MED ORDER — FENTANYL CITRATE 0.05 MG/ML IJ SOLN
INTRAMUSCULAR | Status: AC
  Filled 2014-05-11: qty 2

## 2014-05-11 MED ORDER — ALBUTEROL SULFATE (2.5 MG/3ML) 0.083% IN NEBU
INHALATION_SOLUTION | RESPIRATORY_TRACT | Status: AC
  Filled 2014-05-11: qty 3

## 2014-05-11 MED ORDER — FENTANYL CITRATE 0.05 MG/ML IJ SOLN
INTRAMUSCULAR | Status: DC | PRN
  Administered 2014-05-11 (×2): 25 ug via INTRAVENOUS
  Administered 2014-05-11: 50 ug via INTRAVENOUS

## 2014-05-11 MED ORDER — MIDAZOLAM HCL 2 MG/2ML IJ SOLN
INTRAMUSCULAR | Status: AC
  Filled 2014-05-11: qty 2

## 2014-05-11 MED ORDER — ONDANSETRON HCL 4 MG/2ML IJ SOLN
INTRAMUSCULAR | Status: DC | PRN
  Administered 2014-05-11: 4 mg via INTRAVENOUS

## 2014-05-11 MED ORDER — ALBUTEROL SULFATE (2.5 MG/3ML) 0.083% IN NEBU
2.5000 mg | INHALATION_SOLUTION | Freq: Four times a day (QID) | RESPIRATORY_TRACT | Status: DC | PRN
  Administered 2014-05-11: 2.5 mg via RESPIRATORY_TRACT

## 2014-05-11 SURGICAL SUPPLY — 27 items
BAG URINE DRAINAGE (UROLOGICAL SUPPLIES) ×1 IMPLANT
BASKET LASER NITINOL 1.9FR (BASKET) ×4 IMPLANT
BASKET STNLS GEMINI 4WIRE 3FR (BASKET) IMPLANT
BASKET ZERO TIP NITINOL 2.4FR (BASKET) IMPLANT
BSKT STON RTRVL 120 1.9FR (BASKET) ×2
BSKT STON RTRVL GEM 120X11 3FR (BASKET)
BSKT STON RTRVL ZERO TP 2.4FR (BASKET)
CATH INTERMIT  6FR 70CM (CATHETERS) ×3 IMPLANT
CLOTH BEACON ORANGE TIMEOUT ST (SAFETY) ×3 IMPLANT
ELECT REM PT RETURN 9FT ADLT (ELECTROSURGICAL)
ELECTRODE REM PT RTRN 9FT ADLT (ELECTROSURGICAL) IMPLANT
FIBER LASER FLEXIVA 1000 (UROLOGICAL SUPPLIES) IMPLANT
FIBER LASER FLEXIVA 200 (UROLOGICAL SUPPLIES) ×2 IMPLANT
FIBER LASER FLEXIVA 365 (UROLOGICAL SUPPLIES) IMPLANT
FIBER LASER FLEXIVA 550 (UROLOGICAL SUPPLIES) IMPLANT
FIBER LASER TRAC TIP (UROLOGICAL SUPPLIES) IMPLANT
GLOVE BIOGEL M STRL SZ7.5 (GLOVE) ×3 IMPLANT
GOWN STRL REUS W/TWL LRG LVL3 (GOWN DISPOSABLE) ×6 IMPLANT
GUIDEWIRE ANG ZIPWIRE 038X150 (WIRE) ×3 IMPLANT
GUIDEWIRE STR DUAL SENSOR (WIRE) ×3 IMPLANT
IV NS IRRIG 3000ML ARTHROMATIC (IV SOLUTION) ×3 IMPLANT
PACK CYSTO (CUSTOM PROCEDURE TRAY) ×3 IMPLANT
SHIELD EYE BINOCULAR (MISCELLANEOUS) ×2 IMPLANT
STENT POLARIS 5FRX26 (STENTS) ×2 IMPLANT
SYRINGE 10CC LL (SYRINGE) IMPLANT
SYRINGE IRR TOOMEY STRL 70CC (SYRINGE) IMPLANT
TUBE FEEDING 8FR 16IN STR KANG (MISCELLANEOUS) ×3 IMPLANT

## 2014-05-11 NOTE — Transfer of Care (Signed)
Immediate Anesthesia Transfer of Care Note  Patient: [name]  Procedure(s) Performed: Procedure(s): CYSTOSCOPY WITH RETROGRADE PYELOGRAM, URETEROSCOPY AND STENT PLACEMENT (Left) HOLMIUM LASER APPLICATION (Left)  Patient Location: PACU  Anesthesia Type:General  Level of Consciousness: Patient easily awoken, sedated, comfortable, cooperative, following commands, responds to stimulation.   Airway & Oxygen Therapy: Patient spontaneously breathing, ventilating well, oxygen via simple oxygen mask.  Post-op Assessment: Report given to PACU RN, vital signs reviewed and stable, moving all extremities.   Post vital signs: Reviewed and stable.  Complications: No apparent anesthesia complications

## 2014-05-11 NOTE — Anesthesia Procedure Notes (Signed)
Procedure Name: LMA Insertion Date/Time: 05/11/2014 12:50 PM Performed by: Deliah Boston Pre-anesthesia Checklist: Patient identified, Emergency Drugs available, Suction available and Patient being monitored Patient Re-evaluated:Patient Re-evaluated prior to inductionOxygen Delivery Method: Circle system utilized Preoxygenation: Pre-oxygenation with 100% oxygen Intubation Type: IV induction Ventilation: Mask ventilation without difficulty LMA: LMA inserted LMA Size: 4.0 Number of attempts: 2 Placement Confirmation: positive ETCO2 and breath sounds checked- equal and bilateral Tube secured with: Tape Dental Injury: Teeth and Oropharynx as per pre-operative assessment  Comments: Inserted LMA proseal atraumatically, no etco2, Proseal removed and LMA Unique inserted atraumatically, + etco2

## 2014-05-11 NOTE — H&P (Signed)
[name] is an 59 y.o. female.    Chief Complaint: Pre-OP Left Ureteroscopic Stone Manipulation  HPI:   1 - Left Ureteral Stone - left 75mm prox ureteral stone with mod hydro by CT 1/25 on eval persistent fevers, flank pain during admission for sepsis. Stone is 260HU, SSD 16cm. NO additional stones. Treated with emergent ureteral stent 1/26 in setting urosepsis.   2 - Urosepsis, Pyonephrosis - Episode urosepsis 02/2014 due to pan-sensitive Citrobacter (sens keflex, cipro, bactrim)  She is noncompliant obese diabetic. Repeat UCX 03/2014 (after clinical infection subsided) with persistant pan-sensitive citrobacter suggesting likely chronic colonization. She has been on CX-specific Bactrim prior to today.   PMH sig for COPD, DM2(non-compliant), Morbid obesity, chole, salivary stone surgery.  Today [name] is seen to proceed with left ureteroscopic stone manipulation. No interval fevers.   Past Medical History  Diagnosis Date  . Hypertension   . Diabetes mellitus without complication   . COPD (chronic obstructive pulmonary disease)   . Shortness of breath   . Arthritis   . Diabetic neuropathy   . Rash, skin     face  . Kidney stone   . Sleep apnea     has not used cpap past 10 yrs  . Nocturia   . Urge incontinence     Past Surgical History  Procedure Laterality Date  . Cholecystectomy    . Tubal ligation    . Carpel tunnel    . Cystoscopy w/ ureteral stent placement Left 03/22/2014    Procedure: CYSTOSCOPY WITH RETROGRADE PYELOGRAM/URETERAL STENT PLACEMENT;  Surgeon: Alexis Frock, MD;  Location: [facility];  Service: Urology;  Laterality: Left;  . Breast surgery  1982    LUMPECTOMY / benign  . Salivary stone removal      Family History  Problem Relation Age of Onset  . Adopted: Yes   Social History:  reports that she quit smoking about 4 weeks ago. Her smoking use included Cigarettes. She has a 40 pack-year smoking history. She has never used smokeless tobacco. She reports that  she does not drink alcohol or use illicit drugs.  Allergies:  Allergies  Allergen Reactions  . Ace Inhibitors Anaphylaxis    No prescriptions prior to admission    No results found for this or any previous visit (from the past 48 hour(s)). No results found.  Review of Systems  Constitutional: Negative.  Negative for fever, chills and malaise/fatigue.  HENT: Negative.   Eyes: Negative.   Respiratory: Negative.   Cardiovascular: Negative.   Gastrointestinal: Negative.   Genitourinary: Positive for frequency. Negative for hematuria.  Musculoskeletal: Negative.   Skin: Negative.   Neurological: Negative.   Endo/Heme/Allergies: Negative.   Psychiatric/Behavioral: Negative.     There were no vitals taken for this visit. Physical Exam  Constitutional: She appears well-developed.  Morbid obesity  HENT:  Head: Normocephalic.  Eyes: Pupils are equal, round, and reactive to light.  Neck: Normal range of motion.  Cardiovascular: Normal rate.   Respiratory: Effort normal.  GI: Soft.  Genitourinary:  No CVAT  Musculoskeletal: Normal range of motion.  Neurological: She is alert.  Skin: Skin is warm.  Psychiatric: She has a normal mood and affect. Her behavior is normal. Judgment and thought content normal.     Assessment/Plan  1 - Left Ureteral Stone - now s/p stenting for solitary stone.   We rediscussed ureteroscopic stone manipulation with basketing and laser-lithotripsy in detail.  We rediscussed risks including bleeding, infection, damage to kidney / ureter  bladder, rarely loss of kidney. We rediscussed anesthetic risks and rare but serious surgical complications including DVT, PE, MI, and mortality. We specifically readdressed that in 5-10% of cases a staged approach is required with stenting followed by re-attempt ureteroscopy if anatomy unfavorable.   The patient voiced understanding and wises to proceed as planned.   2 - Urosepsis, Pyonephrosis - no recent fevers,  likely chronically colonized. She has been on CX-specifc Bactrim pre-op to minimze bacterila load but certainly still caries some peri-operative risk.    [name] 05/11/2014, 6:13 AM

## 2014-05-11 NOTE — Brief Op Note (Signed)
05/11/2014  1:23 PM  PATIENT:  [name]  58 y.o. female  PRE-OPERATIVE DIAGNOSIS:  LEFT URETERAL STONE  POST-OPERATIVE DIAGNOSIS:  LEFT URETERAL STONE  PROCEDURE:  Procedure(s): CYSTOSCOPY WITH RETROGRADE PYELOGRAM, URETEROSCOPY AND STENT PLACEMENT (Left) HOLMIUM LASER APPLICATION (Left)  SURGEON:  Surgeon(s) and Role:    * Alexis Frock, MD - Primary  PHYSICIAN ASSISTANT:   ASSISTANTS: none   ANESTHESIA:   general  EBL:     BLOOD ADMINISTERED:none  DRAINS: none   LOCAL MEDICATIONS USED:  NONE  SPECIMEN:  Source of Specimen:  left ureteral stone fragments  DISPOSITION OF SPECIMEN:  [facility] for compositional analysis  COUNTS:  YES  TOURNIQUET:  * No tourniquets in log *  DICTATION: .Other Dictation: Dictation Number [id number]  PLAN OF CARE: Discharge to home after PACU  PATIENT DISPOSITION:  PACU - hemodynamically stable.   Delay start of Pharmacological VTE agent (>24hrs) due to surgical blood loss or risk of bleeding: not applicable

## 2014-05-11 NOTE — Anesthesia Preprocedure Evaluation (Signed)
Anesthesia Evaluation  Patient identified by MRN, date of birth, ID band Patient awake    Reviewed: Allergy & Precautions, NPO status , Patient's Chart, lab work & pertinent test results  Airway Mallampati: II  TM Distance: >3 FB Neck ROM: Full    Dental  (+) Edentulous Upper, Edentulous Lower, Dental Advisory Given   Pulmonary shortness of breath, sleep apnea , pneumonia -, COPDCurrent Smoker, former smoker,  breath sounds clear to auscultation  Pulmonary exam normal       Cardiovascular hypertension, Pt. on medications + Peripheral Vascular Disease Rhythm:Regular Rate:Normal     Neuro/Psych negative neurological ROS  negative psych ROS   GI/Hepatic negative GI ROS, Neg liver ROS,   Endo/Other  diabetes, Well Controlled, Type 2, Oral Hypoglycemic AgentsMorbid obesity  Renal/GU ARFRenal disease     Musculoskeletal  (+) Arthritis -,   Abdominal (+) + obese,   Peds  Hematology negative hematology ROS (+)   Anesthesia Other Findings   Reproductive/Obstetrics negative OB ROS                             Anesthesia Physical Anesthesia Plan  ASA: III  Anesthesia Plan: General   Post-op Pain Management:    Induction: Intravenous  Airway Management Planned: LMA  Additional Equipment:   Intra-op Plan:   Post-operative Plan:   Informed Consent:   Plan Discussed with: Surgeon  Anesthesia Plan Comments:         Anesthesia Quick Evaluation

## 2014-05-11 NOTE — Anesthesia Postprocedure Evaluation (Signed)
  Anesthesia Post-op Note  Patient: [name]  Procedure(s) Performed: Procedure(s) (LRB): CYSTOSCOPY WITH RETROGRADE PYELOGRAM, URETEROSCOPY AND STENT PLACEMENT (Left) HOLMIUM LASER APPLICATION (Left)  Patient Location: PACU  Anesthesia Type: General  Level of Consciousness: awake and alert   Airway and Oxygen Therapy: Patient Spontanous Breathing  Post-op Pain: mild  Post-op Assessment: Post-op Vital signs reviewed, Patient's Cardiovascular Status Stable, Respiratory Function Stable, Patent Airway and No signs of Nausea or vomiting  Last Vitals:  Filed Vitals:   05/11/14 1506  BP: 144/88  Pulse: 86  Temp: 36.6 C  Resp: 12    Post-op Vital Signs: stable   Complications: No apparent anesthesia complications

## 2014-05-11 NOTE — Discharge Instructions (Signed)
1 - You may have urinary urgency (bladder spasms) and bloody urine on / off with stent in place. This is normal.  2 - Call MD or go to ER for fever >102, severe pain / nausea / vomiting not relieved by medications, or acute change in medical status  3 - Remove tethered stent on Monday morning at home by pulling on string, then blue-white plastic tubing and discarding.

## 2014-05-12 ENCOUNTER — Encounter (HOSPITAL_COMMUNITY): Payer: Self-pay | Admitting: Urology

## 2014-05-12 NOTE — Op Note (Signed)
[name], [name] NO.:  [id number]  MEDICAL RECORD NO.:  [id number]  LOCATION:  [facility]                         FACILITY:  [facility]  PHYSICIAN:  Alexis Frock, MD     DATE OF BIRTH:  1956-02-22  DATE OF PROCEDURE:  05/11/2014                               OPERATIVE REPORT   DIAGNOSIS:  Left ureteral stone, history of urosepsis.  PROCEDURE: 1. Cystoscopy with left retrograde and pyelogram interpretation. 2. Left ureteroscopy with laser lithotripsy. 3. Exchange of left ureteral stent 5 x 26 Polaris, with tether to the     thigh.  ESTIMATED BLOOD LOSS:  Nil.  COMPLICATIONS:  None.  SPECIMEN:  Left ureteral stone fragments for composition analysis.  FINDINGS: 1. Left proximal ureteral stone approximately 7 to 8 mm.  This was too     large for simple basketing, therefore holmium laser lithotripsy     used. 2. Successful exchange of left ureteral stent, proximal end renal     pelvis, distal end urinary bladder.  INDICATION:  [name] is a 59 year old lady with history of obesity, noncompliant diabetes, who recently had an episode of obstructing stone and urosepsis in late January of 2016.  She underwent ureteral stenting at that time and admission with IV antibiotics, and she cleared her infectious parameters.  She now presents for definitive management of her stone.  The most recent urine culture did show some persistent Citrobacter, and she has been on antibiotic preoperatively to help reduce this likely chronic colonization.  Informed consent was and placed in medical record.  PROCEDURE IN DETAIL:  The patient being [name] and procedure being left ureteroscopic stone manipulation was confirmed.  Procedure was carried out.  Time-out was performed.  Intravenous antibiotics were administered.  General LMA anesthesia was introduced.  The patient was placed into a low lithotomy position.  Sterile field was created by prepping and draping the patient's  vagina, introitus, and proximal thighs using iodine x3.  Next, cystourethroscopy was performed using a 22-French rigid cystoscope with 30-degree offset lens.  Inspection of the urinary bladder revealed a distal end of left ureteral stent in situ.  This was grasped, brought to the level of the urethral meatus through which 0.038 ZIPwire was advanced at the level of the upper pole. The stent was exchanged for a 6-French end-hole catheter and left retrograde pyelogram was obtained.  Left retrograde pyelogram demonstrated single left ureter, single system, left kidney.  There was no hydroureteronephrosis noted.  There was a questionable filling defect in the proximal left ureter consistent with known stone.  ZIPwire was once again advanced and set aside as a safety wire.  An 8-French feeding tube was placed in urinary bladder for pressure release.  Next, semi-rigid ureteroscopy was performed of the entire length of left ureter alongside a separate Sensor working wire in the proximal ureter.  The stone in question was identified and appeared to be much too large for simple basketing.  As such, holmium laser energy was applied to the stone using settings of 0.2 joules and 10 hertz with 200 nanometer fibers fragmenting the stone into approximately 3 smaller pieces.  These were then each  grasped individually with an escape basket and brought out in their entirety and set aside for compositional analysis.  Next, flexible digital ureteroscopy was performed which allowed panendoscopic examination of the kidney above the area of stone, 1 additional fragment was encountered, this was also amenable to simple basketing and removed.  Final ureteroscopic examination revealed complete resolution of all stone fragments larger than one-third millimeter.  There was no evidence of renal perforation. There was excellent hemostasis.  Given the stone was somewhat impacted, it was felt that additional ureteral  stenting would be warranted.  As such, a new 5 x 26 Polaris-type stent was placed over the safety wire using fluoroscopic guidance.  Good proximal and distal deployment were noted.  The bladder drain tube was removed, and the procedure was terminated.  The patient tolerated the procedure well.  There were no immediate periprocedural complications.  The patient was taken to postanesthesia care unit in stable condition.          ______________________________ Alexis Frock, MD     TM/MEDQ  D:  05/11/2014  T:  05/12/2014  Job:  [id number]

## 2014-05-24 ENCOUNTER — Encounter: Payer: Self-pay | Admitting: *Deleted

## 2014-06-15 ENCOUNTER — Ambulatory Visit (INDEPENDENT_AMBULATORY_CARE_PROVIDER_SITE_OTHER): Payer: Medicaid Other | Admitting: Obstetrics & Gynecology

## 2014-06-15 ENCOUNTER — Encounter: Payer: Self-pay | Admitting: Obstetrics & Gynecology

## 2014-06-15 ENCOUNTER — Other Ambulatory Visit (HOSPITAL_COMMUNITY)
Admission: RE | Admit: 2014-06-15 | Discharge: 2014-06-15 | Disposition: A | Discharge status: Home / Self Care | Payer: Medicaid Other | Source: Ambulatory Visit | Attending: Obstetrics & Gynecology | Admitting: Obstetrics & Gynecology

## 2014-06-15 VITALS — BP 153/77 | HR 86 | Temp 98.5°F | Ht 66.0 in | Wt 258.9 lb

## 2014-06-15 DIAGNOSIS — Z1151 Encounter for screening for human papillomavirus (HPV): Secondary | ICD-10-CM

## 2014-06-15 DIAGNOSIS — Z01419 Encounter for gynecological examination (general) (routine) without abnormal findings: Secondary | ICD-10-CM | POA: Insufficient documentation

## 2014-06-15 DIAGNOSIS — Z0000 Encounter for general adult medical examination without abnormal findings: Secondary | ICD-10-CM

## 2014-06-15 DIAGNOSIS — Z124 Encounter for screening for malignant neoplasm of cervix: Secondary | ICD-10-CM

## 2014-06-15 NOTE — Progress Notes (Signed)
Subjective:    [name] is a 59 y.o. MW female who presents for an annual exam. The patient has no complaints today. The patient is sexually active. GYN screening history: last pap: was normal. The patient wears seatbelts: yes. The patient participates in regular exercise: no. Has the patient ever been transfused or tattooed?: not asked. The patient reports that there is not domestic violence in her life.   Menstrual History: OB History    No data available      Menarche age: 19  No LMP recorded. Patient is postmenopausal.    The following portions of the patient's history were reviewed and updated as appropriate: allergies, current medications, past family history, past medical history, past social history, past surgical history and problem list.  Review of Systems A comprehensive review of systems was negative.    Objective:    BP 153/77 mmHg  Pulse 86  Temp(Src) 98.5 F (36.9 C)  Ht '5\' 6"'$  (1.676 m)  Wt 258 lb 14.4 oz (117.436 kg)  BMI 41.81 kg/m2  General Appearance:    Alert, cooperative, no distress, appears stated age  Head:    Normocephalic, without obvious abnormality, atraumatic  Eyes:    PERRL, conjunctiva/corneas clear, EOM's intact, fundi    benign, both eyes  Ears:    Normal TM's and external ear canals, both ears  Nose:   Nares normal, septum midline, mucosa normal, no drainage    or sinus tenderness  Throat:   Lips, mucosa, and tongue normal; teeth and gums normal  Neck:   Supple, symmetrical, trachea midline, no adenopathy;    thyroid:  no enlargement/tenderness/nodules; no carotid   bruit or JVD  Back:     Symmetric, no curvature, ROM normal, no CVA tenderness  Lungs:     Clear to auscultation bilaterally, respirations unlabored  Chest Wall:    No tenderness or deformity   Heart:    Regular rate and rhythm, S1 and S2 normal, no murmur, rub   or gallop  Breast Exam:    No tenderness, masses, or nipple abnormality  Abdomen:     Soft, non-tender, bowel  sounds active all four quadrants,    no masses, no organomegaly  Genitalia:    Normal female without lesion, discharge or tenderness, no pelvic masses felt     Extremities:   Extremities normal, atraumatic, no cyanosis or edema  Pulses:   2+ and symmetric all extremities  Skin:   Skin color, texture, turgor normal, no rashes or lesions  Lymph nodes:   Cervical, supraclavicular, and axillary nodes normal  Neurologic:   CNII-XII intact, normal strength, sensation and reflexes    throughout  .    Assessment:    Healthy female exam.    Plan:     Mammogram. Thin prep Pap smear. with cotesting

## 2014-06-16 LAB — CYTOLOGY - PAP

## 2014-06-28 ENCOUNTER — Ambulatory Visit (HOSPITAL_COMMUNITY): Payer: Medicaid Other

## 2014-07-19 ENCOUNTER — Ambulatory Visit (HOSPITAL_COMMUNITY)
Admission: RE | Admit: 2014-07-19 | Discharge: 2014-07-19 | Disposition: A | Discharge status: Home / Self Care | Payer: Medicaid Other | Source: Ambulatory Visit | Attending: Obstetrics & Gynecology | Admitting: Obstetrics & Gynecology

## 2014-07-19 ENCOUNTER — Ambulatory Visit (HOSPITAL_COMMUNITY): Payer: Medicaid Other

## 2014-07-19 DIAGNOSIS — Z0000 Encounter for general adult medical examination without abnormal findings: Secondary | ICD-10-CM

## 2014-07-19 DIAGNOSIS — Z1231 Encounter for screening mammogram for malignant neoplasm of breast: Secondary | ICD-10-CM | POA: Diagnosis not present

## 2014-08-22 ENCOUNTER — Other Ambulatory Visit: Payer: Self-pay

## 2014-09-15 ENCOUNTER — Encounter (HOSPITAL_COMMUNITY): Payer: Self-pay | Admitting: Vascular Surgery

## 2014-09-15 DIAGNOSIS — E1140 Type 2 diabetes mellitus with diabetic neuropathy, unspecified: Secondary | ICD-10-CM | POA: Diagnosis not present

## 2014-09-15 DIAGNOSIS — E119 Type 2 diabetes mellitus without complications: Secondary | ICD-10-CM | POA: Diagnosis not present

## 2014-09-15 DIAGNOSIS — J0430 Supraglottitis, unspecified, without obstruction: Secondary | ICD-10-CM | POA: Diagnosis not present

## 2014-09-15 DIAGNOSIS — J384 Edema of larynx: Secondary | ICD-10-CM | POA: Diagnosis present

## 2014-09-15 DIAGNOSIS — Z794 Long term (current) use of insulin: Secondary | ICD-10-CM | POA: Diagnosis not present

## 2014-09-15 DIAGNOSIS — F17210 Nicotine dependence, cigarettes, uncomplicated: Secondary | ICD-10-CM | POA: Insufficient documentation

## 2014-09-15 DIAGNOSIS — J449 Chronic obstructive pulmonary disease, unspecified: Secondary | ICD-10-CM | POA: Diagnosis not present

## 2014-09-15 DIAGNOSIS — G894 Chronic pain syndrome: Secondary | ICD-10-CM | POA: Diagnosis not present

## 2014-09-15 DIAGNOSIS — Z87891 Personal history of nicotine dependence: Secondary | ICD-10-CM | POA: Diagnosis not present

## 2014-09-15 DIAGNOSIS — E1121 Type 2 diabetes mellitus with diabetic nephropathy: Secondary | ICD-10-CM | POA: Insufficient documentation

## 2014-09-15 NOTE — ED Notes (Signed)
Pt reports to the ED for eval of sore throat, left jaw, and left pain. She reports she has had these symptoms for several weeks but it got worse approx 2 weeks ago. She reports that she had the same symptoms the last time she had a salivary stone blockage. She reports that she has difficulty swallowing food and liquids. Voice is hoarse. Was seen at [facility] and he stated she was too complicated to have it removed in the office. Airway intact. Pt A&Ox4, resp e/u, and skin warm and dry.

## 2014-09-16 ENCOUNTER — Observation Stay (HOSPITAL_COMMUNITY)
Admission: EM | Admit: 2014-09-16 | Discharge: 2014-09-17 | Disposition: A | Discharge status: Home / Self Care | Payer: Medicaid Other | Attending: Oncology | Admitting: Oncology

## 2014-09-16 ENCOUNTER — Encounter (HOSPITAL_COMMUNITY): Payer: Self-pay | Admitting: Radiology

## 2014-09-16 ENCOUNTER — Emergency Department (HOSPITAL_COMMUNITY): Payer: Medicaid Other

## 2014-09-16 DIAGNOSIS — F17210 Nicotine dependence, cigarettes, uncomplicated: Secondary | ICD-10-CM | POA: Diagnosis not present

## 2014-09-16 DIAGNOSIS — J0510 Acute epiglottitis without obstruction: Secondary | ICD-10-CM | POA: Diagnosis not present

## 2014-09-16 DIAGNOSIS — I10 Essential (primary) hypertension: Secondary | ICD-10-CM | POA: Diagnosis present

## 2014-09-16 DIAGNOSIS — J0430 Supraglottitis, unspecified, without obstruction: Secondary | ICD-10-CM | POA: Diagnosis present

## 2014-09-16 DIAGNOSIS — J386 Stenosis of larynx: Secondary | ICD-10-CM

## 2014-09-16 DIAGNOSIS — M549 Dorsalgia, unspecified: Secondary | ICD-10-CM

## 2014-09-16 DIAGNOSIS — L089 Local infection of the skin and subcutaneous tissue, unspecified: Secondary | ICD-10-CM

## 2014-09-16 DIAGNOSIS — J441 Chronic obstructive pulmonary disease with (acute) exacerbation: Secondary | ICD-10-CM | POA: Diagnosis present

## 2014-09-16 DIAGNOSIS — R911 Solitary pulmonary nodule: Secondary | ICD-10-CM

## 2014-09-16 DIAGNOSIS — M25569 Pain in unspecified knee: Secondary | ICD-10-CM

## 2014-09-16 DIAGNOSIS — B9689 Other specified bacterial agents as the cause of diseases classified elsewhere: Secondary | ICD-10-CM | POA: Diagnosis not present

## 2014-09-16 DIAGNOSIS — Z87891 Personal history of nicotine dependence: Secondary | ICD-10-CM | POA: Diagnosis present

## 2014-09-16 DIAGNOSIS — Z794 Long term (current) use of insulin: Secondary | ICD-10-CM

## 2014-09-16 DIAGNOSIS — E1121 Type 2 diabetes mellitus with diabetic nephropathy: Secondary | ICD-10-CM | POA: Diagnosis not present

## 2014-09-16 DIAGNOSIS — J449 Chronic obstructive pulmonary disease, unspecified: Secondary | ICD-10-CM

## 2014-09-16 DIAGNOSIS — G894 Chronic pain syndrome: Secondary | ICD-10-CM | POA: Diagnosis present

## 2014-09-16 DIAGNOSIS — J384 Edema of larynx: Secondary | ICD-10-CM

## 2014-09-16 DIAGNOSIS — Z720 Tobacco use: Secondary | ICD-10-CM

## 2014-09-16 HISTORY — DX: Polyneuropathy, unspecified: G62.9

## 2014-09-16 HISTORY — DX: Disorder of kidney and ureter, unspecified: N28.9

## 2014-09-16 LAB — I-STAT CHEM 8, ED
BUN: 35 mg/dL — ABNORMAL HIGH (ref 6–20)
Calcium, Ion: 1.23 mmol/L (ref 1.12–1.23)
Chloride: 107 mmol/L (ref 101–111)
Creatinine, Ser: 1.3 mg/dL — ABNORMAL HIGH (ref 0.44–1.00)
GLUCOSE: 65 mg/dL (ref 65–99)
HCT: 44 % (ref 36.0–46.0)
Hemoglobin: 15 g/dL (ref 12.0–15.0)
POTASSIUM: 3.9 mmol/L (ref 3.5–5.1)
Sodium: 144 mmol/L (ref 135–145)
TCO2: 26 mmol/L (ref 0–100)

## 2014-09-16 LAB — I-STAT CG4 LACTIC ACID, ED: Lactic Acid, Venous: 0.68 mmol/L (ref 0.5–2.0)

## 2014-09-16 LAB — CBC WITH DIFFERENTIAL/PLATELET
Basophils Absolute: 0.1 10*3/uL (ref 0.0–0.1)
Basophils Relative: 1 % (ref 0–1)
EOS PCT: 3 % (ref 0–5)
Eosinophils Absolute: 0.3 10*3/uL (ref 0.0–0.7)
HCT: 40.5 % (ref 36.0–46.0)
HEMOGLOBIN: 13.7 g/dL (ref 12.0–15.0)
LYMPHS PCT: 32 % (ref 12–46)
Lymphs Abs: 3 10*3/uL (ref 0.7–4.0)
MCH: 29.1 pg (ref 26.0–34.0)
MCHC: 33.8 g/dL (ref 30.0–36.0)
MCV: 86 fL (ref 78.0–100.0)
MONO ABS: 0.4 10*3/uL (ref 0.1–1.0)
MONOS PCT: 5 % (ref 3–12)
Neutro Abs: 5.7 10*3/uL (ref 1.7–7.7)
Neutrophils Relative %: 59 % (ref 43–77)
Platelets: 200 10*3/uL (ref 150–400)
RBC: 4.71 MIL/uL (ref 3.87–5.11)
RDW: 13.3 % (ref 11.5–15.5)
WBC: 9.5 10*3/uL (ref 4.0–10.5)

## 2014-09-16 LAB — GLUCOSE, CAPILLARY
GLUCOSE-CAPILLARY: 121 mg/dL — AB (ref 65–99)
Glucose-Capillary: 201 mg/dL — ABNORMAL HIGH (ref 65–99)
Glucose-Capillary: 135 mg/dL — ABNORMAL HIGH (ref 65–99)

## 2014-09-16 LAB — RAPID STREP SCREEN (MED CTR MEBANE ONLY): STREPTOCOCCUS, GROUP A SCREEN (DIRECT): NEGATIVE

## 2014-09-16 MED ORDER — INSULIN ASPART 100 UNIT/ML ~~LOC~~ SOLN: 0.0000 [IU] | Freq: Every day | SUBCUTANEOUS | Status: DC

## 2014-09-16 MED ORDER — ENOXAPARIN SODIUM 40 MG/0.4ML ~~LOC~~ SOLN
40.0000 mg | SUBCUTANEOUS | Status: DC
  Administered 2014-09-16 – 2014-09-17 (×2): 40 mg via SUBCUTANEOUS
  Filled 2014-09-16 (×3): qty 0.4

## 2014-09-16 MED ORDER — SODIUM CHLORIDE 0.9 % IV SOLN: 250.0000 mL | INTRAVENOUS | Status: DC | PRN

## 2014-09-16 MED ORDER — ACETAMINOPHEN 325 MG PO TABS
650.0000 mg | ORAL_TABLET | Freq: Four times a day (QID) | ORAL | Status: DC | PRN
Start: 2014-09-16 — End: 2014-09-17

## 2014-09-16 MED ORDER — PHENOL 1.4 % MT LIQD
1.0000 | OROMUCOSAL | Status: DC | PRN
  Filled 2014-09-16: qty 177

## 2014-09-16 MED ORDER — IOHEXOL 300 MG/ML  SOLN
75.0000 mL | Freq: Once | INTRAMUSCULAR | Status: AC | PRN
  Administered 2014-09-16: 75 mL via INTRAVENOUS

## 2014-09-16 MED ORDER — BUPRENORPHINE HCL 8 MG SL SUBL
8.0000 mg | SUBLINGUAL_TABLET | Freq: Two times a day (BID) | SUBLINGUAL | Status: DC
  Administered 2014-09-16 – 2014-09-17 (×3): 8 mg via SUBLINGUAL
  Filled 2014-09-16 (×4): qty 1

## 2014-09-16 MED ORDER — SODIUM CHLORIDE 0.9 % IJ SOLN: 3.0000 mL | INTRAMUSCULAR | Status: DC | PRN

## 2014-09-16 MED ORDER — AMLODIPINE BESYLATE 10 MG PO TABS
10.0000 mg | ORAL_TABLET | Freq: Every day | ORAL | Status: DC
  Administered 2014-09-16 – 2014-09-17 (×2): 10 mg via ORAL
  Filled 2014-09-16 (×3): qty 1

## 2014-09-16 MED ORDER — ALBUTEROL SULFATE (2.5 MG/3ML) 0.083% IN NEBU: 2.5000 mg | INHALATION_SOLUTION | RESPIRATORY_TRACT | Status: DC | PRN

## 2014-09-16 MED ORDER — MENTHOL 3 MG MT LOZG
1.0000 | LOZENGE | OROMUCOSAL | Status: DC | PRN
  Filled 2014-09-16: qty 9

## 2014-09-16 MED ORDER — SODIUM CHLORIDE 0.9 % IJ SOLN
3.0000 mL | Freq: Two times a day (BID) | INTRAMUSCULAR | Status: DC
  Administered 2014-09-16: 3 mL via INTRAVENOUS

## 2014-09-16 MED ORDER — DEXAMETHASONE SODIUM PHOSPHATE 10 MG/ML IJ SOLN
10.0000 mg | Freq: Four times a day (QID) | INTRAMUSCULAR | Status: DC
  Filled 2014-09-16 (×3): qty 1

## 2014-09-16 MED ORDER — SODIUM CHLORIDE 0.9 % IV SOLN
INTRAVENOUS | Status: AC
  Administered 2014-09-16 – 2014-09-17 (×2): via INTRAVENOUS

## 2014-09-16 MED ORDER — SODIUM CHLORIDE 0.9 % IV SOLN
3.0000 g | Freq: Four times a day (QID) | INTRAVENOUS | Status: DC
  Administered 2014-09-16 – 2014-09-17 (×6): 3 g via INTRAVENOUS
  Filled 2014-09-16 (×10): qty 3

## 2014-09-16 MED ORDER — ACETAMINOPHEN 650 MG RE SUPP
650.0000 mg | Freq: Four times a day (QID) | RECTAL | Status: DC | PRN
Start: 2014-09-16 — End: 2014-09-17

## 2014-09-16 MED ORDER — INSULIN GLARGINE 100 UNIT/ML ~~LOC~~ SOLN
25.0000 [IU] | SUBCUTANEOUS | Status: DC
  Administered 2014-09-17: 25 [IU] via SUBCUTANEOUS
  Filled 2014-09-16 (×2): qty 0.25

## 2014-09-16 MED ORDER — DEXAMETHASONE SODIUM PHOSPHATE 10 MG/ML IJ SOLN
10.0000 mg | Freq: Once | INTRAMUSCULAR | Status: AC
  Administered 2014-09-16: 10 mg via INTRAVENOUS
  Filled 2014-09-16: qty 1

## 2014-09-16 MED ORDER — CYCLOBENZAPRINE HCL 10 MG PO TABS
10.0000 mg | ORAL_TABLET | Freq: Every evening | ORAL | Status: DC | PRN
  Administered 2014-09-16: 10 mg via ORAL
  Filled 2014-09-16: qty 1

## 2014-09-16 MED ORDER — BUPRENORPHINE HCL 8 MG SL SUBL: 8.0000 mg | SUBLINGUAL_TABLET | Freq: Every day | SUBLINGUAL | Status: DC

## 2014-09-16 MED ORDER — KETOROLAC TROMETHAMINE 30 MG/ML IJ SOLN
15.0000 mg | Freq: Once | INTRAMUSCULAR | Status: AC
  Administered 2014-09-16: 15 mg via INTRAVENOUS
  Filled 2014-09-16: qty 1

## 2014-09-16 MED ORDER — INSULIN ASPART 100 UNIT/ML ~~LOC~~ SOLN
0.0000 [IU] | Freq: Three times a day (TID) | SUBCUTANEOUS | Status: DC
  Administered 2014-09-16: 5 [IU] via SUBCUTANEOUS

## 2014-09-16 MED ORDER — SENNOSIDES-DOCUSATE SODIUM 8.6-50 MG PO TABS
1.0000 | ORAL_TABLET | Freq: Two times a day (BID) | ORAL | Status: DC
  Administered 2014-09-16 – 2014-09-17 (×3): 1 via ORAL
  Filled 2014-09-16 (×3): qty 1

## 2014-09-16 MED ORDER — IBUPROFEN 400 MG PO TABS: 400.0000 mg | ORAL_TABLET | Freq: Four times a day (QID) | ORAL | Status: DC | PRN

## 2014-09-16 MED ORDER — PREGABALIN 75 MG PO CAPS
75.0000 mg | ORAL_CAPSULE | Freq: Four times a day (QID) | ORAL | Status: DC
  Administered 2014-09-16 – 2014-09-17 (×6): 75 mg via ORAL
  Filled 2014-09-16 (×6): qty 1

## 2014-09-16 MED ORDER — AMPICILLIN-SULBACTAM SODIUM 3 (2-1) G IJ SOLR
3.0000 g | Freq: Once | INTRAMUSCULAR | Status: AC
  Administered 2014-09-16: 3 g via INTRAVENOUS
  Filled 2014-09-16: qty 3

## 2014-09-16 MED ORDER — SODIUM CHLORIDE 0.9 % IV BOLUS (SEPSIS)
1000.0000 mL | Freq: Once | INTRAVENOUS | Status: AC
  Administered 2014-09-16: 1000 mL via INTRAVENOUS

## 2014-09-16 MED ORDER — BUPRENORPHINE HCL 8 MG SL SUBL: 8.0000 mg | SUBLINGUAL_TABLET | Freq: Two times a day (BID) | SUBLINGUAL | Status: DC

## 2014-09-16 MED ORDER — NICOTINE 14 MG/24HR TD PT24
14.0000 mg | MEDICATED_PATCH | Freq: Every day | TRANSDERMAL | Status: DC
  Administered 2014-09-16 – 2014-09-17 (×2): 14 mg via TRANSDERMAL
  Filled 2014-09-16 (×3): qty 1

## 2014-09-16 NOTE — Consult Note (Signed)
Reason for Consult:Throat pain Referring Physician: ER  [name] is an 59 y.o. female.  HPI: 59 year old female with DM, COPD, HTN who has had left-sided throat pain since winter intermittently.  Two weeks ago, she had a worsened episode and was evaluated by dentistry and an oral surgeon but no specific treatment given.  She has a past history of a right submandibular gland stone that resulted in gland removal 20 years ago and she is concerned that this may be her problem now.  Two days ago, pain really worsened again with radiation to the left ear.  Swallowing is difficult.  She denies fever.  She presented to the ER this morning with this problem and she has been given Decadron and Unasyn and a CT was performed.  Consultation was requested.  Past Medical History  Diagnosis Date  . Hypertension   . Diabetes mellitus without complication   . COPD (chronic obstructive pulmonary disease)   . Shortness of breath   . Arthritis   . Diabetic neuropathy   . Rash, skin     face  . Kidney stone   . Sleep apnea     has not used cpap past 10 yrs  . Nocturia   . Urge incontinence   . Morbid obesity with BMI of 40.0-44.9, adult   . Renal insufficiency     Past Surgical History  Procedure Laterality Date  . Cholecystectomy    . Tubal ligation    . Carpel tunnel    . Cystoscopy w/ ureteral stent placement Left 03/22/2014    Procedure: CYSTOSCOPY WITH RETROGRADE PYELOGRAM/URETERAL STENT PLACEMENT;  Surgeon: Alexis Frock, MD;  Location: [facility];  Service: Urology;  Laterality: Left;  . Breast surgery  1982    LUMPECTOMY / benign  . Salivary stone removal    . Cystoscopy with retrograde pyelogram, ureteroscopy and stent placement Left 05/11/2014    Procedure: [place], URETEROSCOPY AND STENT PLACEMENT;  Surgeon: Alexis Frock, MD;  Location: [facility];  Service: Urology;  Laterality: Left;  . Holmium laser application Left 08/01/3708    Procedure: HOLMIUM LASER  APPLICATION;  Surgeon: Alexis Frock, MD;  Location: [facility];  Service: Urology;  Laterality: Left;    Family History  Problem Relation Age of Onset  . Adopted: Yes    Social History:  reports that she quit smoking about 5 months ago. Her smoking use included Cigarettes. She has a 40 pack-year smoking history. She has never used smokeless tobacco. She reports that she does not drink alcohol or use illicit drugs.  Allergies:  Allergies  Allergen Reactions  . Ace Inhibitors Anaphylaxis    Medications: I have reviewed the patient's current medications.  Results for orders placed or performed during the hospital encounter of 09/16/14 (from the past 48 hour(s))  CBC with Differential/Platelet     Status: None   Collection Time: 09/16/14 12:55 AM  Result Value Ref Range   WBC 9.5 4.0 - 10.5 K/uL   RBC 4.71 3.87 - 5.11 MIL/uL   Hemoglobin 13.7 12.0 - 15.0 g/dL   HCT 40.5 36.0 - 46.0 %   MCV 86.0 78.0 - 100.0 fL   MCH 29.1 26.0 - 34.0 pg   MCHC 33.8 30.0 - 36.0 g/dL   RDW 13.3 11.5 - 15.5 %   Platelets 200 150 - 400 K/uL   Neutrophils Relative % 59 43 - 77 %   Neutro Abs 5.7 1.7 - 7.7 K/uL   Lymphocytes Relative  32 12 - 46 %   Lymphs Abs 3.0 0.7 - 4.0 K/uL   Monocytes Relative 5 3 - 12 %   Monocytes Absolute 0.4 0.1 - 1.0 K/uL   Eosinophils Relative 3 0 - 5 %   Eosinophils Absolute 0.3 0.0 - 0.7 K/uL   Basophils Relative 1 0 - 1 %   Basophils Absolute 0.1 0.0 - 0.1 K/uL  I-Stat CG4 Lactic Acid, ED     Status: None   Collection Time: 09/16/14  1:20 AM  Result Value Ref Range   Lactic Acid, Venous 0.68 0.5 - 2.0 mmol/L  I-stat chem 8, ed     Status: Abnormal   Collection Time: 09/16/14  1:52 AM  Result Value Ref Range   Sodium 144 135 - 145 mmol/L   Potassium 3.9 3.5 - 5.1 mmol/L   Chloride 107 101 - 111 mmol/L   BUN 35 (H) 6 - 20 mg/dL   Creatinine, Ser 1.30 (H) 0.44 - 1.00 mg/dL   Glucose, Bld 65 65 - 99 mg/dL   Calcium, Ion 1.23 1.12 - 1.23 mmol/L   TCO2 26 0 - 100  mmol/L   Hemoglobin 15.0 12.0 - 15.0 g/dL   HCT 44.0 36.0 - 46.0 %  Rapid strep screen     Status: None   Collection Time: 09/16/14  4:16 AM  Result Value Ref Range   Streptococcus, Group A Screen (Direct) NEGATIVE NEGATIVE    Comment: (NOTE) A Rapid Antigen test may result negative if the antigen level in the sample is below the detection level of this test. The FDA has not cleared this test as a stand-alone test therefore the rapid antigen negative result has reflexed to a Group A Strep culture.     Ct Soft Tissue Neck W Contrast  09/16/2014   CLINICAL DATA:  Initial evaluation for hoarseness, sore throat, left shallow pain.  EXAM: CT NECK WITH CONTRAST  TECHNIQUE: Multidetector CT imaging of the neck was performed using the standard protocol following the bolus administration of intravenous contrast.  CONTRAST:  13m OMNIPAQUE IOHEXOL 300 MG/ML  SOLN  COMPARISON:  None available.  FINDINGS: Visualized portions of the brain demonstrate no acute abnormality. Probable DVA present within the left cerebellar hemisphere. Visualized globes and orbits demonstrate no acute abnormality.  Paranasal sinuses are well pneumatized and free of fluid. No mastoid effusion. Middle ear cavities are clear.  Parotid glands are symmetric in appearance bilaterally and within normal limits. The right submandibular gland appears to be absent. Left submandibular gland within normal limits without acute inflammatory changes.  Oral cavity within normal limits. No mass lesion or loculated fluid collection. Patient is edentulous. No significant inflammatory changes present about the mandible or within the oral cavity. Palatine tonsils are symmetric and normal bilaterally. Parapharyngeal fat is well preserved. Oropharynx and nasopharynx within normal limits. Epiglottis grossly normal. Vallecula is clear. Lingual tonsils within normal limits.  No retropharyngeal fluid collection. There is fullness with asymmetric edema within the  left supraglottic larynx/hypopharynx at the level of the false vocal cords and laryngeal ventricles (series 2, image 59). There is superior extension towards the left area epiglottic fold and piriform sinus. Finding is suggestive of possible acute pharyngitis. Mild mass effect on the adjacent airway which is narrowed to 7 mm at this level. No significant inflammatory changes within the adjacent soft tissues of the neck. No discrete or loculated rim enhancing fluid collection.  Subglottic airway is clear.  Mildly prominent right level IIa node measures 12  mm in short axis. No other pathologically enlarged lymph nodes identified within the neck.  Visualized superior mediastinum within normal limits.  3 mm nodule present within the peripheral right upper lobe (series 2, image 100), indeterminate. The visualized lungs are otherwise clear.  Normal intravascular enhancement seen within the neck.  Reversal of the normal cervical lordosis with moderate degenerative disc disease at C5-6 and C6-7. No acute osseous abnormality. No worrisome lytic or blastic osseous lesions.  IMPRESSION: 1. Mild soft tissue fullness and edema within the left supraglottic larynx/hypopharynx as above, most suggestive of acute pharyngitis. There is mild mass effect on the adjacent pharyngeal airway which remains patent. No discrete or rim enhancing fluid collection to suggest abscess. Clinical followup to resolution is recommended. 2. No other acute abnormality within the neck. 3. 3 mm right upper lobe pulmonary nodule, indeterminate. If the patient is at high risk for bronchogenic carcinoma, follow-up chest CT at 1 year is recommended. If the patient is at low risk, no follow-up is needed. This recommendation follows the consensus statement: Guidelines for Management of Small Pulmonary Nodules Detected on CT Scans: A Statement from the [place] as published in Radiology 2005; 237:395-400.   Electronically Signed   By: Jeannine Boga M.D.   On: 09/16/2014 03:44    Review of Systems  HENT: Positive for ear pain and sore throat.   All other systems reviewed and are negative.  Blood pressure 140/93, pulse 73, temperature 98.9 F (37.2 C), temperature source Oral, resp. rate 18, SpO2 97 %. Physical Exam  Constitutional: She is oriented to person, place, and time. She appears well-developed and well-nourished. No distress.  HENT:  Head: Normocephalic and atraumatic.  Right Ear: External ear normal.  Left Ear: External ear normal.  Nose: Nose normal.  Mouth/Throat: Oropharynx is clear and moist.  Edentulous with mild patchy white changes on inferior alveolar surface.  Eyes: Conjunctivae and EOM are normal. Pupils are equal, round, and reactive to light.  Neck: Normal range of motion. Neck supple.  Left lateral neck tenderness.  No palpable mass.  Cardiovascular: Normal rate.   Respiratory: Effort normal.  Musculoskeletal: Normal range of motion.  Neurological: She is alert and oriented to person, place, and time. No cranial nerve deficit.  Skin: Skin is warm and dry.  Psychiatric: She has a normal mood and affect. Her behavior is normal. Judgment and thought content normal.    Assessment/Plan: Left supraglottitis, diabetes, smoker I personally reviewed her neck CT demonstrating focal edematous changes in the left supraglottic region.  The left submandibular gland appears normal with no stone identified.  Fiberoptic laryngoscopy was performed, see procedure note.  The left supraglottis is edematous but not obstructing.  The endolarynx has more chronic-appearing edema changes that may be related more to long-term smoking and possibly reflux.  With supraglottic edema, I recommend admission for IV Unasyn for at least 24 hours or until clinical improvement is clear.  She can then be discharged on oral Augmentin.  ENT will follow and help with recommendations on discharge timing.  Ozelle Brubacher 09/16/2014, 7:00 AM

## 2014-09-16 NOTE — ED Provider Notes (Signed)
CSN: [id number]     Arrival date & time 09/15/14  2211 History  This chart was scribed for Varney Biles, MD by Eustaquio Maize, ED Scribe. This patient was seen in room A08C/A08C and the patient's care was started at 12:39 AM.  Chief Complaint  Patient presents with  . Sore Throat   The history is provided by the patient. No language interpreter was used.     HPI Comments: [name] is a 59 y.o. female with hx HTN, DM, COPD who presents to the Emergency Department complaining of sore throat x 7 months, worsening 2 weeks ago. She notes increased facial swelling x 2 weeks and hoarseness in her voice x 1 week. Pt also complains of cough, left sided jaw pain, left sided neck pain, and left ear pain x approximately 2 weeks. She notes difficulty swallowing liquids as well. Pt notes that approximately 20 years ago she had a salivary stone blockage which was removed. She states that these symptoms feel similar. Denies fever, chills, or any other associated symptoms. Pt is current every day smoker, smoking 1 ppd for the past 40 years.   Past Medical History  Diagnosis Date  . Hypertension   . COPD (chronic obstructive pulmonary disease)   . Shortness of breath   . Arthritis   . Diabetic neuropathy   . Rash, skin     face  . Kidney stone   . Sleep apnea     has not used cpap past 10 yrs  . Nocturia   . Urge incontinence   . Morbid obesity with BMI of 40.0-44.9, adult   . Renal insufficiency   . Diabetes mellitus without complication     INSULIN DEPENDENT  . Neuropathy    Past Surgical History  Procedure Laterality Date  . Cholecystectomy    . Tubal ligation    . Carpel tunnel    . Cystoscopy w/ ureteral stent placement Left 03/22/2014    Procedure: CYSTOSCOPY WITH RETROGRADE PYELOGRAM/URETERAL STENT PLACEMENT;  Surgeon: Alexis Frock, MD;  Location: [facility];  Service: Urology;  Laterality: Left;  . Breast surgery  1982    LUMPECTOMY / benign  . Salivary stone removal    . Cystoscopy  with retrograde pyelogram, ureteroscopy and stent placement Left 05/11/2014    Procedure: [place], URETEROSCOPY AND STENT PLACEMENT;  Surgeon: Alexis Frock, MD;  Location: [facility];  Service: Urology;  Laterality: Left;  . Holmium laser application Left 08/14/3557    Procedure: HOLMIUM LASER APPLICATION;  Surgeon: Alexis Frock, MD;  Location: [facility];  Service: Urology;  Laterality: Left;   Family History  Problem Relation Age of Onset  . Adopted: Yes   History  Substance Use Topics  . Smoking status: Current Every Day Smoker -- 1.00 packs/day for 40 years    Types: Cigarettes  . Smokeless tobacco: Never Used  . Alcohol Use: No   OB History    No data available     Review of Systems  Constitutional: Negative for fever and chills.  HENT: Positive for ear pain, facial swelling, sore throat and trouble swallowing.   Respiratory: Positive for cough.   Cardiovascular: Negative for chest pain.  Gastrointestinal: Negative for nausea, vomiting and abdominal pain.  Musculoskeletal: Positive for neck pain.  Skin: Negative for rash.  Psychiatric/Behavioral: Negative for confusion.      Allergies  Ace inhibitors  Home Medications   Prior to Admission medications   Medication Sig Start Date End Date Taking? Authorizing  Provider  albuterol (PROVENTIL HFA;VENTOLIN HFA) 108 (90 BASE) MCG/ACT inhaler Inhale 2 puffs into the lungs every 4 (four) hours as needed for wheezing or shortness of breath. 08/10/12  Yes Hosie Poisson, MD  amLODipine (NORVASC) 10 MG tablet Take 1 tablet (10 mg total) by mouth daily. 03/24/14  Yes Bonnielee Haff, MD  blood glucose meter kit and supplies KIT Dispense based on patient and insurance preference. Use up to four times daily as directed. (FOR ICD-9 250.00, 250.01). 03/24/14  Yes Bonnielee Haff, MD  buprenorphine-naloxone (SUBOXONE) 8-2 MG SUBL SL tablet Place 1 tablet under the tongue 2 (two) times daily.   Yes Historical Provider, MD   cyclobenzaprine (FLEXERIL) 10 MG tablet Take 10 mg by mouth at bedtime as needed for muscle spasms.   Yes Historical Provider, MD  ibuprofen (ADVIL,MOTRIN) 200 MG tablet Take 400-600 mg by mouth every 6 (six) hours as needed for fever, headache or moderate pain.    Yes Historical Provider, MD  insulin aspart (NOVOLOG FLEXPEN) 100 UNIT/ML FlexPen Give yourself 3 units before each meal. 03/24/14  Yes Bonnielee Haff, MD  Insulin Glargine (LANTUS SOLOSTAR) 100 UNIT/ML Solostar Pen Inject 34 Units into the skin daily. At 10AM 03/24/14  Yes Bonnielee Haff, MD  Insulin Pen Needle (PEN NEEDLES 3/16") 31G X 5 MM MISC Use as directed. 03/24/14  Yes Bonnielee Haff, MD  pregabalin (LYRICA) 75 MG capsule Take 75 mg by mouth 4 (four) times daily.   Yes Historical Provider, MD  senna-docusate (SENOKOT-S) 8.6-50 MG per tablet Take 1 tablet by mouth 2 (two) times daily. While taking pain meds to prevent constipation. 05/11/14  Yes Alexis Frock, MD   Triage Vitals: BP 163/74 mmHg  Pulse 87  Temp(Src) 98.9 F (37.2 C) (Oral)  Resp 18  SpO2 95%   Physical Exam  Constitutional: She is oriented to person, place, and time. She appears well-developed and well-nourished. No distress.  HENT:  Head: Normocephalic and atraumatic.  Mouth/Throat: Oropharynx is clear and moist.  Posterior pharynx is normal; No exudate seen  Eyes: Conjunctivae and EOM are normal.  Neck: Neck supple. No tracheal deviation present.  Possible nodules on the thyroid that are palpable No preauricular or postauricular lymphadenopathy  Positive right sided anterior cervical lymphadenopathy   Cardiovascular: Normal rate, regular rhythm and normal heart sounds.   Pulmonary/Chest: Effort normal and breath sounds normal. No respiratory distress. She has no wheezes. She has no rales.  Musculoskeletal: Normal range of motion. She exhibits no tenderness.  Neurological: She is alert and oriented to person, place, and time.  Skin: Skin is warm and  dry.  Psychiatric: She has a normal mood and affect. Her behavior is normal.  Nursing note and vitals reviewed.   ED Course  Procedures (including critical care time)  _0 :30: CT scan results discussed. i am not certain that this is infectious process. Pt has passed po challenge. She is getting a strep screen and decadron and toradol. She has gotten ample iv hydration. Anticipate d/c - but i will likely have to call ENT to ensure she is seen soon.  DIAGNOSTIC STUDIES: Oxygen Saturation is 95% on RA, adequate by my interpretation.    COORDINATION OF CARE: 12:51 AM-Discussed treatment plan which includes CBC, Lactic acid with pt at bedside and pt agreed to plan.    Labs Review Labs Reviewed  GLUCOSE, CAPILLARY - Abnormal; Notable for the following:    Glucose-Capillary 121 (*)    All other components within normal limits  GLUCOSE, CAPILLARY -  Abnormal; Notable for the following:    Glucose-Capillary 201 (*)    All other components within normal limits  GLUCOSE, CAPILLARY - Abnormal; Notable for the following:    Glucose-Capillary 135 (*)    All other components within normal limits  I-STAT CHEM 8, ED - Abnormal; Notable for the following:    BUN 35 (*)    Creatinine, Ser 1.30 (*)    All other components within normal limits  RAPID STREP SCREEN (NOT AT [facility])  CULTURE, GROUP A STREP  CBC WITH DIFFERENTIAL/PLATELET  I-STAT CG4 LACTIC ACID, ED    Imaging Review Ct Soft Tissue Neck W Contrast  09/16/2014   CLINICAL DATA:  Initial evaluation for hoarseness, sore throat, left shallow pain.  EXAM: CT NECK WITH CONTRAST  TECHNIQUE: Multidetector CT imaging of the neck was performed using the standard protocol following the bolus administration of intravenous contrast.  CONTRAST:  55m OMNIPAQUE IOHEXOL 300 MG/ML  SOLN  COMPARISON:  None available.  FINDINGS: Visualized portions of the brain demonstrate no acute abnormality. Probable DVA present within the left cerebellar hemisphere.  Visualized globes and orbits demonstrate no acute abnormality.  Paranasal sinuses are well pneumatized and free of fluid. No mastoid effusion. Middle ear cavities are clear.  Parotid glands are symmetric in appearance bilaterally and within normal limits. The right submandibular gland appears to be absent. Left submandibular gland within normal limits without acute inflammatory changes.  Oral cavity within normal limits. No mass lesion or loculated fluid collection. Patient is edentulous. No significant inflammatory changes present about the mandible or within the oral cavity. Palatine tonsils are symmetric and normal bilaterally. Parapharyngeal fat is well preserved. Oropharynx and nasopharynx within normal limits. Epiglottis grossly normal. Vallecula is clear. Lingual tonsils within normal limits.  No retropharyngeal fluid collection. There is fullness with asymmetric edema within the left supraglottic larynx/hypopharynx at the level of the false vocal cords and laryngeal ventricles (series 2, image 59). There is superior extension towards the left area epiglottic fold and piriform sinus. Finding is suggestive of possible acute pharyngitis. Mild mass effect on the adjacent airway which is narrowed to 7 mm at this level. No significant inflammatory changes within the adjacent soft tissues of the neck. No discrete or loculated rim enhancing fluid collection.  Subglottic airway is clear.  Mildly prominent right level IIa node measures 12 mm in short axis. No other pathologically enlarged lymph nodes identified within the neck.  Visualized superior mediastinum within normal limits.  3 mm nodule present within the peripheral right upper lobe (series 2, image 100), indeterminate. The visualized lungs are otherwise clear.  Normal intravascular enhancement seen within the neck.  Reversal of the normal cervical lordosis with moderate degenerative disc disease at C5-6 and C6-7. No acute osseous abnormality. No worrisome  lytic or blastic osseous lesions.  IMPRESSION: 1. Mild soft tissue fullness and edema within the left supraglottic larynx/hypopharynx as above, most suggestive of acute pharyngitis. There is mild mass effect on the adjacent pharyngeal airway which remains patent. No discrete or rim enhancing fluid collection to suggest abscess. Clinical followup to resolution is recommended. 2. No other acute abnormality within the neck. 3. 3 mm right upper lobe pulmonary nodule, indeterminate. If the patient is at high risk for bronchogenic carcinoma, follow-up chest CT at 1 year is recommended. If the patient is at low risk, no follow-up is needed. This recommendation follows the consensus statement: Guidelines for Management of Small Pulmonary Nodules Detected on CT Scans: A Statement from the [employer] as published in Radiology 2005; [id number].   Electronically Signed   By: Jeannine Boga M.D.   On: 09/16/2014 03:44     EKG Interpretation None        MDM   Final diagnoses:  Supraglottic edema  Neck infection  Supraglottic airway obstruction    I personally performed the services described in this documentation, which was scribed in my presence. The recorded information has been reviewed and is accurate.  Pt comes in w/ sore throat. She has L sided neck tenderness, no visible signs of pharyngitis. She has hoarseness in her voice. There is some dysphagia as well. Thyroid is normal size. Pt has extensive smoking hx. DDX: Tumor/Mass, Vocal cord lesion, Deep space infection.  Pt has no respiratory complain. She is not drooling. CT soft tissue ordered.    Varney Biles, MD 09/16/14 2253

## 2014-09-16 NOTE — ED Notes (Signed)
ENT MD at bedside.

## 2014-09-16 NOTE — Procedures (Signed)
Preop diagnosis: Left supraglottitis Postop diagnosis: Same Procedure: Transnasal fiberoptic laryngoscopy Surgeon: Redmond Baseman Anesth: Topical with 4% lidocaine Compl: None Findings: Left supraglottic edema involves the left epiglottis, aryepiglottic fold, obliterates the pyriform sinus, and involves the arytenoid mildly.  Edema is not obstructive.  The endolarynx has more chronic-appearing edema with pink and edematous vocal folds.  Laryngeal movement is fairly normal. Description: After discussing risks, benefits, and alternatives, the right nasal passage was sprayed with 4% lidocaine.  She was placed in a seated position.  The fiberoptic laryngoscope was passed through the right nasal passage in order to view the pharynx and larynx.  Findings are noted above.  After completion, the scope was removed and she was returned to nursing care in stable condition.

## 2014-09-16 NOTE — Progress Notes (Signed)
ANTIBIOTIC CONSULT NOTE - INITIAL  Pharmacy Consult for Unasyn Indication: supraglottic infection  Allergies  Allergen Reactions  . Ace Inhibitors Anaphylaxis    Patient Measurements: Height: '5\' 6"'$  (167.6 cm) Weight: 236 lb 6.4 oz (107.23 kg) IBW/kg (Calculated) : 59.3  Vital Signs: Temp: 98.3 F (36.8 C) (07/22 0937) Temp Source: Oral (07/22 0937) BP: 143/72 mmHg (07/22 0937) Pulse Rate: 70 (07/22 0937) Intake/Output from previous day:   Intake/Output from this shift:    Labs:  Recent Labs  09/16/14 0055 09/16/14 0152  WBC 9.5  --   HGB 13.7 15.0  PLT 200  --   CREATININE  --  1.30*   Estimated Creatinine Clearance: 59.9 mL/min (by C-G formula based on Cr of 1.3).    Microbiology: Recent Results (from the past 720 hour(s))  Rapid strep screen     Status: None   Collection Time: 09/16/14  4:16 AM  Result Value Ref Range Status   Streptococcus, Group A Screen (Direct) NEGATIVE NEGATIVE Final    Comment: (NOTE) A Rapid Antigen test may result negative if the antigen level in the sample is below the detection level of this test. The FDA has not cleared this test as a stand-alone test therefore the rapid antigen negative result has reflexed to a Group A Strep culture.     Medical History: Past Medical History  Diagnosis Date  . Hypertension   . Diabetes mellitus without complication   . COPD (chronic obstructive pulmonary disease)   . Shortness of breath   . Arthritis   . Diabetic neuropathy   . Rash, skin     face  . Kidney stone   . Sleep apnea     has not used cpap past 10 yrs  . Nocturia   . Urge incontinence   . Morbid obesity with BMI of 40.0-44.9, adult   . Renal insufficiency     Medications:  Scheduled:  . amLODipine  10 mg Oral Daily  . buprenorphine  8 mg Sublingual Daily  . enoxaparin (LOVENOX) injection  40 mg Subcutaneous Q24H  . insulin aspart  0-15 Units Subcutaneous TID WC  . insulin aspart  0-5 Units Subcutaneous QHS  .  insulin glargine  25 Units Subcutaneous Q24H  . nicotine  14 mg Transdermal Daily  . pregabalin  75 mg Oral QID  . senna-docusate  1 tablet Oral BID  . sodium chloride  3 mL Intravenous Q12H   Assessment: 59 yo F with a hx of throat pain since January.  Pt was evaluated by PCP but was told it was allergies.  Last week she went ot her dentist and was referred to an oral surgeon.  The oral surgeon told her she needed to be evaluated in the hospital and thus she presented to the emergency room.  The pain has worsened over the last 2 weeks and has resulted in some associated dysphagia.  CT revealed L supraglottic edema suggestive of acute pharyngitis without abscess.  Rapid strep test negative.  ENT was consulted and recommended 24 hours of IV abx with Unasyn wit change to PO abx if pt improves.  Goal of Therapy:  Renal Dose Adjustment Eradication of Infection  Plan:  Unasyn 3gm IV q6h Anticipate no further dose adjustments.  Rx will sign off.  [employer], Pharm.D., BCPS Clinical Pharmacist Pager [phone] 09/16/2014 10:51 AM

## 2014-09-16 NOTE — H&P (Signed)
Date: 09/16/2014               Patient Name:  [name] MRN: [id number]  DOB: 10-18-1955 Age / Sex: 59 y.o., female   PCP: No Pcp Per Patient         Medical Service: Internal Medicine Teaching Service         Attending Physician: Dr. Annia Belt, MD    First Contact: Dr. Benjamine Mola Pager: [phone]  Second Contact: Dr. Denton Brick Pager: [phone]       After Hours (After 5p/  First Contact Pager: [phone]  weekends / holidays): Second Contact Pager: [phone]   Chief Complaint: sore throat  History of Present Illness: [name] is a 59 yo F with PMH of HTN, DM, COPD who presents with CC of sore throat.  She reports her throat irritation first started in January, she did bring it up to her PCP twice but apparently it was felt to be allergies.  She then went to a dentist a little over a week ago to be evaluated her referred her to an oral Psychologist, sport and exercise.  She reports that the oral surgeon (who she went to 1 week ago) said she needed to be evaluated in the hospital and told her to go to the emergency room.  She reports that she was busy taking care of grandchildren and waited to today when the throat/jaw pain became unbearable to come to be evaluated in the ED.  She reports that the sore throat and pain really became worse over about the past 2 weeks, she has had some associated dysphasia with liquids being particuarrly hard to swallow.  She does note that she had a salivary duct stone which caused a similar pain and blockage about 20 years ago which needed to be surgically removed.  She reports that her DM is currently well controlled with last A1c of 6%.  She does report 45 lbs of weight loss she attributes this to a diabetic diet and cutting out junk foods.  She is a current 1 ppd smoker, and has overall smoked 1.5 ppd for 44 years.  Meds: No current facility-administered medications for this encounter.   Current Outpatient Prescriptions  Medication Sig Dispense Refill  . albuterol (PROVENTIL  HFA;VENTOLIN HFA) 108 (90 BASE) MCG/ACT inhaler Inhale 2 puffs into the lungs every 4 (four) hours as needed for wheezing or shortness of breath. 18 g 1  . amLODipine (NORVASC) 10 MG tablet Take 1 tablet (10 mg total) by mouth daily. 30 tablet 6  . blood glucose meter kit and supplies KIT Dispense based on patient and insurance preference. Use up to four times daily as directed. (FOR ICD-9 250.00, 250.01). 1 each 0  . buprenorphine-naloxone (SUBOXONE) 8-2 MG SUBL SL tablet Place 1 tablet under the tongue 2 (two) times daily.    . cyclobenzaprine (FLEXERIL) 10 MG tablet Take 10 mg by mouth at bedtime as needed for muscle spasms.    Marland Kitchen ibuprofen (ADVIL,MOTRIN) 200 MG tablet Take 400-600 mg by mouth every 6 (six) hours as needed for fever, headache or moderate pain.     Marland Kitchen insulin aspart (NOVOLOG FLEXPEN) 100 UNIT/ML FlexPen Give yourself 3 units before each meal. 15 mL 3  . Insulin Glargine (LANTUS SOLOSTAR) 100 UNIT/ML Solostar Pen Inject 34 Units into the skin daily. At 10AM 15 mL 3  . Insulin Pen Needle (PEN NEEDLES 3/16") 31G X 5 MM MISC Use as directed. 100 each 3  . pregabalin (LYRICA) 75  MG capsule Take 75 mg by mouth 4 (four) times daily.    Marland Kitchen senna-docusate (SENOKOT-S) 8.6-50 MG per tablet Take 1 tablet by mouth 2 (two) times daily. While taking pain meds to prevent constipation. 30 tablet 0    Allergies: Allergies as of 09/15/2014 - Review Complete 06/15/2014  Allergen Reaction Noted  . Ace inhibitors Anaphylaxis    Past Medical History  Diagnosis Date  . Hypertension   . Diabetes mellitus without complication   . COPD (chronic obstructive pulmonary disease)   . Shortness of breath   . Arthritis   . Diabetic neuropathy   . Rash, skin     face  . Kidney stone   . Sleep apnea     has not used cpap past 10 yrs  . Nocturia   . Urge incontinence   . Morbid obesity with BMI of 40.0-44.9, adult   . Renal insufficiency    Past Surgical History  Procedure Laterality Date  .  Cholecystectomy    . Tubal ligation    . Carpel tunnel    . Cystoscopy w/ ureteral stent placement Left 03/22/2014    Procedure: CYSTOSCOPY WITH RETROGRADE PYELOGRAM/URETERAL STENT PLACEMENT;  Surgeon: Alexis Frock, MD;  Location: [facility];  Service: Urology;  Laterality: Left;  . Breast surgery  1982    LUMPECTOMY / benign  . Salivary stone removal    . Cystoscopy with retrograde pyelogram, ureteroscopy and stent placement Left 05/11/2014    Procedure: [place], URETEROSCOPY AND STENT PLACEMENT;  Surgeon: Alexis Frock, MD;  Location: [facility];  Service: Urology;  Laterality: Left;  . Holmium laser application Left 9/67/8938    Procedure: HOLMIUM LASER APPLICATION;  Surgeon: Alexis Frock, MD;  Location: [facility];  Service: Urology;  Laterality: Left;   Family History  Problem Relation Age of Onset  . Adopted: Yes   History   Social History  . Marital Status: Married    Spouse Name: N/A  . Number of Children: N/A  . Years of Education: N/A   Occupational History  . Not on file.   Social History Main Topics  . Smoking status: Former Smoker -- 1.00 packs/day for 40 years    Types: Cigarettes    Quit date: 04/07/2014  . Smokeless tobacco: Never Used  . Alcohol Use: No  . Drug Use: No  . Sexual Activity: Not on file   Other Topics Concern  . Not on file   Social History Narrative    Review of Systems: Review of Systems  Constitutional: Positive for weight loss. Negative for fever, chills and malaise/fatigue.  HENT: Positive for ear pain (left) and sore throat. Negative for congestion, ear discharge, nosebleeds and tinnitus.   Eyes: Negative for blurred vision and double vision.  Respiratory: Negative for cough, shortness of breath, wheezing and stridor.   Cardiovascular: Negative for chest pain.  Gastrointestinal: Negative for heartburn and abdominal pain.  Genitourinary: Negative for dysuria.  Musculoskeletal: Negative for myalgias.  Skin:  Negative for rash.  Neurological: Negative for dizziness and headaches.  Psychiatric/Behavioral: Negative for depression and substance abuse.     Physical Exam: Blood pressure 140/93, pulse 73, temperature 98.9 F (37.2 C), temperature source Oral, resp. rate 18, SpO2 94 %. Physical Exam  Constitutional: She is well-developed, well-nourished, and in no distress.  Smells of smoke  HENT:  Mouth/Throat: Mucous membranes are normal. No trismus in the jaw. No dental abscesses, uvula swelling or dental caries. Posterior oropharyngeal edema present. No oropharyngeal exudate,  posterior oropharyngeal erythema or tonsillar abscesses.  edentulous  Neck:  Mild tenderness left superior aspect of neck posterior to angle on mandable, could not appreciate any cervical adenopathy  Cardiovascular: Normal rate, regular rhythm and normal heart sounds.   Pulmonary/Chest: Effort normal and breath sounds normal. She has no wheezes.  Abdominal: Soft. Bowel sounds are normal.  Musculoskeletal: She exhibits no edema.  Skin: Skin is warm and dry.  Nursing note and vitals reviewed.    Lab results: Basic Metabolic Panel:  Recent Labs  09/16/14 0152  NA 144  K 3.9  CL 107  GLUCOSE 65  BUN 35*  CREATININE 1.30*   Liver Function Tests: No results for input(s): AST, ALT, ALKPHOS, BILITOT, PROT, ALBUMIN in the last 72 hours. No results for input(s): LIPASE, AMYLASE in the last 72 hours. No results for input(s): AMMONIA in the last 72 hours. CBC:  Recent Labs  09/16/14 0055 09/16/14 0152  WBC 9.5  --   NEUTROABS 5.7  --   HGB 13.7 15.0  HCT 40.5 44.0  MCV 86.0  --   PLT 200  --    Urine Drug Screen: Drugs of Abuse     Component Value Date/Time   LABOPIA NONE DETECTED 08/05/2012 1142   COCAINSCRNUR NONE DETECTED 08/05/2012 1142   LABBENZ NONE DETECTED 08/05/2012 1142   AMPHETMU NONE DETECTED 08/05/2012 1142   THCU NONE DETECTED 08/05/2012 1142   LABBARB NONE DETECTED 08/05/2012 1142      Imaging results:  Ct Soft Tissue Neck W Contrast  09/16/2014   CLINICAL DATA:  Initial evaluation for hoarseness, sore throat, left shallow pain.  EXAM: CT NECK WITH CONTRAST  TECHNIQUE: Multidetector CT imaging of the neck was performed using the standard protocol following the bolus administration of intravenous contrast.  CONTRAST:  23m OMNIPAQUE IOHEXOL 300 MG/ML  SOLN  COMPARISON:  None available.  FINDINGS: Visualized portions of the brain demonstrate no acute abnormality. Probable DVA present within the left cerebellar hemisphere. Visualized globes and orbits demonstrate no acute abnormality.  Paranasal sinuses are well pneumatized and free of fluid. No mastoid effusion. Middle ear cavities are clear.  Parotid glands are symmetric in appearance bilaterally and within normal limits. The right submandibular gland appears to be absent. Left submandibular gland within normal limits without acute inflammatory changes.  Oral cavity within normal limits. No mass lesion or loculated fluid collection. Patient is edentulous. No significant inflammatory changes present about the mandible or within the oral cavity. Palatine tonsils are symmetric and normal bilaterally. Parapharyngeal fat is well preserved. Oropharynx and nasopharynx within normal limits. Epiglottis grossly normal. Vallecula is clear. Lingual tonsils within normal limits.  No retropharyngeal fluid collection. There is fullness with asymmetric edema within the left supraglottic larynx/hypopharynx at the level of the false vocal cords and laryngeal ventricles (series 2, image 59). There is superior extension towards the left area epiglottic fold and piriform sinus. Finding is suggestive of possible acute pharyngitis. Mild mass effect on the adjacent airway which is narrowed to 7 mm at this level. No significant inflammatory changes within the adjacent soft tissues of the neck. No discrete or loculated rim enhancing fluid collection.  Subglottic  airway is clear.  Mildly prominent right level IIa node measures 12 mm in short axis. No other pathologically enlarged lymph nodes identified within the neck.  Visualized superior mediastinum within normal limits.  3 mm nodule present within the peripheral right upper lobe (series 2, image 100), indeterminate. The visualized lungs are otherwise clear.  Normal intravascular enhancement seen within the neck.  Reversal of the normal cervical lordosis with moderate degenerative disc disease at C5-6 and C6-7. No acute osseous abnormality. No worrisome lytic or blastic osseous lesions.  IMPRESSION: 1. Mild soft tissue fullness and edema within the left supraglottic larynx/hypopharynx as above, most suggestive of acute pharyngitis. There is mild mass effect on the adjacent pharyngeal airway which remains patent. No discrete or rim enhancing fluid collection to suggest abscess. Clinical followup to resolution is recommended. 2. No other acute abnormality within the neck. 3. 3 mm right upper lobe pulmonary nodule, indeterminate. If the patient is at high risk for bronchogenic carcinoma, follow-up chest CT at 1 year is recommended. If the patient is at low risk, no follow-up is needed. This recommendation follows the consensus statement: Guidelines for Management of Small Pulmonary Nodules Detected on CT Scans: A Statement from the [place] as published in Radiology 2005; 237:395-400.   Electronically Signed   By: Jeannine Boga M.D.   On: 09/16/2014 03:44    Other results: EKG: none  Assessment & Plan by Problem: Principal Problem:   Infection of supraglottis (throat) - CT revealed left supraglottic edema suggestive of acute pharyngitis without abscess, rapid strep test was negative in the ED.  She has been seen by Dr Redmond Baseman who recommends admission for at least 24 hours of IV antibiotics with Unasyn. - Will continue Unasyn - Monitor clinically for improvement - No evidence of airway compromise on  exam or by laryngoscopy, will hold off further doses of decadron for now.    Chronic pain syndrome>> 2/2 chronic knee and back pain - Will continue home medications, per formulary change from subloxone to buprenorphine 56m daily. - Continue lyrica - Flexeril PRN - Ibuprofen PRN    Essential hypertension - Continue amlodipine    Tobacco abuse - Education given, will order nicotine patch while inpatient    COPD (chronic obstructive pulmonary disease) - No previous pfts on file.  No active wheezing. - Albuterol PRN    Controlled type 2 diabetes mellitus with diabetic nephropathy - Patient with poor po intake but did receive a dose of decadron in the ED - Lantus 25 units - SSI-M    Incidental lung nodule, less than or equal to 322m- Given smoking history will need follow up CT chest in 1 year.   Dispo: Disposition is deferred at this time, awaiting improvement of current medical problems. Anticipated discharge in approximately 1-2 day(s).   Patient reports she has been seeing MiJuluis MireNP as her PCP, she would like to change PCP if possible.  The patient does not know have transportation limitations that hinder transportation to clinic appointments.  Signed: ErLucious GrovesDO 09/16/2014, 8:25 AM

## 2014-09-16 NOTE — ED Notes (Signed)
ENT Cart staged at room. Nasal/Laryngoscope also available at room.

## 2014-09-17 DIAGNOSIS — B9689 Other specified bacterial agents as the cause of diseases classified elsewhere: Secondary | ICD-10-CM | POA: Diagnosis not present

## 2014-09-17 DIAGNOSIS — J0510 Acute epiglottitis without obstruction: Secondary | ICD-10-CM | POA: Diagnosis not present

## 2014-09-17 LAB — GLUCOSE, CAPILLARY
GLUCOSE-CAPILLARY: 97 mg/dL (ref 65–99)
Glucose-Capillary: 116 mg/dL — ABNORMAL HIGH (ref 65–99)
Glucose-Capillary: 98 mg/dL (ref 65–99)

## 2014-09-17 MED ORDER — AMOXICILLIN-POT CLAVULANATE 600-42.9 MG/5ML PO SUSR: 875.0000 mg | Freq: Two times a day (BID) | ORAL | Status: DC

## 2014-09-17 MED ORDER — AMOXICILLIN-POT CLAVULANATE 875-125 MG PO TABS: 1.0000 | ORAL_TABLET | Freq: Once | ORAL | Status: DC

## 2014-09-17 NOTE — Progress Notes (Signed)
Reviewed discharge paperwork with pt.  Pt denied any other needs at this time.  PIV removed.  Pt taken to discharge location via wheelchair.

## 2014-09-17 NOTE — Discharge Instructions (Addendum)
Most infections of the upper airway are viral, however some severe infections may require antibiotics to fully resolve. You will be discharged with Augmentin (amoxicillin-clavulanic acid) to be taken twice daily for 7 days. Take the complete course even if symptoms are resolved before that time.  You will be receiving a phone call Monday 7/25 with follow up clinic instructions.  3.3 mm right upper lobe pulmonary nodule was noted on head/neck CT obtained for upper respiratory tract cellulitis. Given smoking history, follow-up chest CT at 1 year would be suggested by lung CA screening recommendations. We will forward this information to your healthcare provider at F/U visit.

## 2014-09-17 NOTE — Progress Notes (Signed)
Patient ID: [name], female   DOB: October 13, 1955, 59 y.o.   MRN: [id number] Subjective: No trouble breathing. She is able to eat food better than drink liquids.  Objective: Vital signs in last 24 hours: Temp:  [97.8 F (36.6 C)-98.6 F (37 C)] 97.8 F (36.6 C) (07/23 0640) Pulse Rate:  [65-81] 72 (07/23 0640) Resp:  [13-17] 13 (07/23 0640) BP: (109-143)/(52-74) 109/52 mmHg (07/23 0640) SpO2:  [95 %-97 %] 95 % (07/23 0640) Weight:  [107.23 kg (236 lb 6.4 oz)] 107.23 kg (236 lb 6.4 oz) (07/22 0937) Weight change:  Last BM Date: 09/14/14  Intake/Output from previous day: 07/22 0701 - 07/23 0700 In: 1435 [P.O.:240; I.V.:895; IV Piggyback:300] Out: 350 [Urine:350] Intake/Output this shift:    PHYSICAL EXAM: Awake and alert, trying to have breakfast. No stridor or respiratory distress.  Lab Results:  Recent Labs  09/16/14 0055 09/16/14 0152  WBC 9.5  --   HGB 13.7 15.0  HCT 40.5 44.0  PLT 200  --    BMET  Recent Labs  09/16/14 0152  NA 144  K 3.9  CL 107  GLUCOSE 65  BUN 35*  CREATININE 1.30*    Studies/Results: Ct Soft Tissue Neck W Contrast  09/16/2014   CLINICAL DATA:  Initial evaluation for hoarseness, sore throat, left shallow pain.  EXAM: CT NECK WITH CONTRAST  TECHNIQUE: Multidetector CT imaging of the neck was performed using the standard protocol following the bolus administration of intravenous contrast.  CONTRAST:  55m OMNIPAQUE IOHEXOL 300 MG/ML  SOLN  COMPARISON:  None available.  FINDINGS: Visualized portions of the brain demonstrate no acute abnormality. Probable DVA present within the left cerebellar hemisphere. Visualized globes and orbits demonstrate no acute abnormality.  Paranasal sinuses are well pneumatized and free of fluid. No mastoid effusion. Middle ear cavities are clear.  Parotid glands are symmetric in appearance bilaterally and within normal limits. The right submandibular gland appears to be absent. Left submandibular gland within normal  limits without acute inflammatory changes.  Oral cavity within normal limits. No mass lesion or loculated fluid collection. Patient is edentulous. No significant inflammatory changes present about the mandible or within the oral cavity. Palatine tonsils are symmetric and normal bilaterally. Parapharyngeal fat is well preserved. Oropharynx and nasopharynx within normal limits. Epiglottis grossly normal. Vallecula is clear. Lingual tonsils within normal limits.  No retropharyngeal fluid collection. There is fullness with asymmetric edema within the left supraglottic larynx/hypopharynx at the level of the false vocal cords and laryngeal ventricles (series 2, image 59). There is superior extension towards the left area epiglottic fold and piriform sinus. Finding is suggestive of possible acute pharyngitis. Mild mass effect on the adjacent airway which is narrowed to 7 mm at this level. No significant inflammatory changes within the adjacent soft tissues of the neck. No discrete or loculated rim enhancing fluid collection.  Subglottic airway is clear.  Mildly prominent right level IIa node measures 12 mm in short axis. No other pathologically enlarged lymph nodes identified within the neck.  Visualized superior mediastinum within normal limits.  3 mm nodule present within the peripheral right upper lobe (series 2, image 100), indeterminate. The visualized lungs are otherwise clear.  Normal intravascular enhancement seen within the neck.  Reversal of the normal cervical lordosis with moderate degenerative disc disease at C5-6 and C6-7. No acute osseous abnormality. No worrisome lytic or blastic osseous lesions.  IMPRESSION: 1. Mild soft tissue fullness and edema within the left supraglottic larynx/hypopharynx as above, most suggestive  of acute pharyngitis. There is mild mass effect on the adjacent pharyngeal airway which remains patent. No discrete or rim enhancing fluid collection to suggest abscess. Clinical followup to  resolution is recommended. 2. No other acute abnormality within the neck. 3. 3 mm right upper lobe pulmonary nodule, indeterminate. If the patient is at high risk for bronchogenic carcinoma, follow-up chest CT at 1 year is recommended. If the patient is at low risk, no follow-up is needed. This recommendation follows the consensus statement: Guidelines for Management of Small Pulmonary Nodules Detected on CT Scans: A Statement from the [place] as published in Radiology 2005; 237:395-400.   Electronically Signed   By: Jeannine Boga M.D.   On: 09/16/2014 03:44    Medications: I have reviewed the patient's current medications.  Assessment/Plan: Supraglottitis, no airway concerns. May advance to soft diet.     [name] 09/17/2014, 8:38 AM

## 2014-09-17 NOTE — Progress Notes (Signed)
Subjective: Pain partially improved compared to yesterday. No dysphagia, pain greater with thin liquids than thick liquids and solids. No stridor, no SOB.  Objective: Vital signs in last 24 hours: Filed Vitals:   09/16/14 0937 09/16/14 1414 09/16/14 2159 09/17/14 0640  BP: 143/72 141/74 130/66 109/52  Pulse: 70 81 65 72  Temp: 98.3 F (36.8 C) 98.6 F (37 C) 98.1 F (36.7 C) 97.8 F (36.6 C)  TempSrc: Oral Oral Oral Oral  Resp: '16  17 13  '$ Height: '5\' 6"'$  (1.676 m)     Weight: 107.23 kg (236 lb 6.4 oz)     SpO2: 95% 97% 96% 95%   Weight change:   Intake/Output Summary (Last 24 hours) at 09/17/14 1007 Last data filed at 09/17/14 0656  Gross per 24 hour  Intake   1435 ml  Output    350 ml  Net   1085 ml   GENERAL- alert, co-operative, NAD HEENT- Edentulous, oral mucosa appears moist, no oropharyngeal erythema evident, uvula normal size and midline, minimal TTP below mandible or on neck, submandibular salivary gland not grossly enlarged, no cervical adenopathy CARDIAC- RRR, no murmurs, rubs or gallops RESP- CTAB, no wheezes or crackles, no stridor EXTREMITIES- pulse 2+, symmetric, mild BLE skin changes consistent with venous stasis SKIN- Warm, dry  Lab Results: Basic Metabolic Panel:  Recent Labs Lab 09/16/14 0152  NA 144  K 3.9  CL 107  GLUCOSE 65  BUN 35*  CREATININE 1.30*   Liver Function Tests: No results for input(s): AST, ALT, ALKPHOS, BILITOT, PROT, ALBUMIN in the last 168 hours. No results for input(s): LIPASE, AMYLASE in the last 168 hours. No results for input(s): AMMONIA in the last 168 hours. CBC:  Recent Labs Lab 09/16/14 0055 09/16/14 0152  WBC 9.5  --   NEUTROABS 5.7  --   HGB 13.7 15.0  HCT 40.5 44.0  MCV 86.0  --   PLT 200  --    Cardiac Enzymes: No results for input(s): CKTOTAL, CKMB, CKMBINDEX, TROPONINI in the last 168 hours. BNP: No results for input(s): PROBNP in the last 168 hours. D-Dimer: No results for input(s): DDIMER in  the last 168 hours. CBG:  Recent Labs Lab 09/16/14 1217 09/16/14 1714 09/16/14 2156 09/17/14 0812  GLUCAP 121* 201* 135* 116*   Hemoglobin A1C: No results for input(s): HGBA1C in the last 168 hours. Fasting Lipid Panel: No results for input(s): CHOL, HDL, LDLCALC, TRIG, CHOLHDL, LDLDIRECT in the last 168 hours. Thyroid Function Tests: No results for input(s): TSH, T4TOTAL, FREET4, T3FREE, THYROIDAB in the last 168 hours. Coagulation: No results for input(s): LABPROT, INR in the last 168 hours. Anemia Panel: No results for input(s): VITAMINB12, FOLATE, FERRITIN, TIBC, IRON, RETICCTPCT in the last 168 hours. Urine Drug Screen: Drugs of Abuse     Component Value Date/Time   LABOPIA NONE DETECTED 08/05/2012 1142   COCAINSCRNUR NONE DETECTED 08/05/2012 1142   LABBENZ NONE DETECTED 08/05/2012 1142   AMPHETMU NONE DETECTED 08/05/2012 1142   THCU NONE DETECTED 08/05/2012 1142   LABBARB NONE DETECTED 08/05/2012 1142    Alcohol Level: No results for input(s): ETH in the last 168 hours. Urinalysis: No results for input(s): COLORURINE, LABSPEC, PHURINE, GLUCOSEU, HGBUR, BILIRUBINUR, KETONESUR, PROTEINUR, UROBILINOGEN, NITRITE, LEUKOCYTESUR in the last 168 hours.  Invalid input(s): APPERANCEUR  Micro Results: Recent Results (from the past 240 hour(s))  Rapid strep screen     Status: None   Collection Time: 09/16/14  4:16 AM  Result Value Ref Range  Status   Streptococcus, Group A Screen (Direct) NEGATIVE NEGATIVE Final    Comment: (NOTE) A Rapid Antigen test may result negative if the antigen level in the sample is below the detection level of this test. The FDA has not cleared this test as a stand-alone test therefore the rapid antigen negative result has reflexed to a Group A Strep culture.    Studies/Results: Ct Soft Tissue Neck W Contrast  09/16/2014   CLINICAL DATA:  Initial evaluation for hoarseness, sore throat, left shallow pain.  EXAM: CT NECK WITH CONTRAST   TECHNIQUE: Multidetector CT imaging of the neck was performed using the standard protocol following the bolus administration of intravenous contrast.  CONTRAST:  58m OMNIPAQUE IOHEXOL 300 MG/ML  SOLN  COMPARISON:  None available.  FINDINGS: Visualized portions of the brain demonstrate no acute abnormality. Probable DVA present within the left cerebellar hemisphere. Visualized globes and orbits demonstrate no acute abnormality.  Paranasal sinuses are well pneumatized and free of fluid. No mastoid effusion. Middle ear cavities are clear.  Parotid glands are symmetric in appearance bilaterally and within normal limits. The right submandibular gland appears to be absent. Left submandibular gland within normal limits without acute inflammatory changes.  Oral cavity within normal limits. No mass lesion or loculated fluid collection. Patient is edentulous. No significant inflammatory changes present about the mandible or within the oral cavity. Palatine tonsils are symmetric and normal bilaterally. Parapharyngeal fat is well preserved. Oropharynx and nasopharynx within normal limits. Epiglottis grossly normal. Vallecula is clear. Lingual tonsils within normal limits.  No retropharyngeal fluid collection. There is fullness with asymmetric edema within the left supraglottic larynx/hypopharynx at the level of the false vocal cords and laryngeal ventricles (series 2, image 59). There is superior extension towards the left area epiglottic fold and piriform sinus. Finding is suggestive of possible acute pharyngitis. Mild mass effect on the adjacent airway which is narrowed to 7 mm at this level. No significant inflammatory changes within the adjacent soft tissues of the neck. No discrete or loculated rim enhancing fluid collection.  Subglottic airway is clear.  Mildly prominent right level IIa node measures 12 mm in short axis. No other pathologically enlarged lymph nodes identified within the neck.  Visualized superior  mediastinum within normal limits.  3 mm nodule present within the peripheral right upper lobe (series 2, image 100), indeterminate. The visualized lungs are otherwise clear.  Normal intravascular enhancement seen within the neck.  Reversal of the normal cervical lordosis with moderate degenerative disc disease at C5-6 and C6-7. No acute osseous abnormality. No worrisome lytic or blastic osseous lesions.  IMPRESSION: 1. Mild soft tissue fullness and edema within the left supraglottic larynx/hypopharynx as above, most suggestive of acute pharyngitis. There is mild mass effect on the adjacent pharyngeal airway which remains patent. No discrete or rim enhancing fluid collection to suggest abscess. Clinical followup to resolution is recommended. 2. No other acute abnormality within the neck. 3. 3 mm right upper lobe pulmonary nodule, indeterminate. If the patient is at high risk for bronchogenic carcinoma, follow-up chest CT at 1 year is recommended. If the patient is at low risk, no follow-up is needed. This recommendation follows the consensus statement: Guidelines for Management of Small Pulmonary Nodules Detected on CT Scans: A Statement from the [place] published in Radiology 2005; 237:395-400.   Electronically Signed   By: BJeannine BogaM.D.   On: 09/16/2014 03:44   Medications: I have reviewed the patient's current medications. Scheduled Meds: . amLODipine  10 mg Oral Daily  . ampicillin-sulbactam (UNASYN) IV  3 g Intravenous Q6H  . buprenorphine  8 mg Sublingual BID  . enoxaparin (LOVENOX) injection  40 mg Subcutaneous Q24H  . insulin aspart  0-15 Units Subcutaneous TID WC  . insulin aspart  0-5 Units Subcutaneous QHS  . insulin glargine  25 Units Subcutaneous Q24H  . nicotine  14 mg Transdermal Daily  . pregabalin  75 mg Oral QID  . senna-docusate  1 tablet Oral BID  . sodium chloride  3 mL Intravenous Q12H   Continuous Infusions: . sodium chloride 75 mL/hr at 09/17/14 0103     PRN Meds:.sodium chloride, acetaminophen **OR** acetaminophen, albuterol, cyclobenzaprine, menthol-cetylpyridinium, phenol, sodium chloride Assessment/Plan: Supraglottitis: Tolerating PO intake with some odynophagia, no apparent aspiration or choking. No stridor. ENT has evaluated and recommends no additional intervention at this time. Most appropriate therapy at this time is transition to PO abx and outpatient follow up.antibiotics with Unasyn. - Unasyn->augmentin, to be continued at discharge - Advance to soft diet, check in PM - No evidence of respiratory symptoms - Discontinue IVF  Controlled type 2 diabetes mellitus with diabetic nephropathy: glucose is controlled 60-201 in past 24hrs, No changes required will be discharged on home dose insulin  FULL CODE FEN Soft diet DVT ppx [place] lovenox '40mg'$   Dispo:  Anticipated discharge to home in afternoon today if tolerating solid diet.   The patient does have a current PCP Juluis Mire, NP) but expressed interest in changing PCP, and does need an [facility] hospital follow-up appointment after discharge.  The patient does not have transportation limitations that hinder transportation to clinic appointments.    Collier Salina, MD 09/17/2014, 10:07 AM

## 2014-09-17 NOTE — Discharge Summary (Signed)
Name: [name] MRN: [id number] DOB: January 21, 1956 59 y.o. PCP: Kerin Perna, NP  Date of Admission: 09/16/2014 12:19 AM Date of Discharge: 09/17/2014 Attending Physician: Murriel Hopper  Discharge Diagnosis: Principal Problem:   Infection of supraglottis (throat) Active Problems:   Chronic pain syndrome   Essential hypertension   Tobacco abuse   COPD (chronic obstructive pulmonary disease)   Controlled type 2 diabetes mellitus with diabetic nephropathy   Incidental lung nodule, less than or equal to 54m  Discharge Medications:   Medication List    TAKE these medications        albuterol 108 (90 BASE) MCG/ACT inhaler  Commonly known as:  PROVENTIL HFA;VENTOLIN HFA  Inhale 2 puffs into the lungs every 4 (four) hours as needed for wheezing or shortness of breath.     amLODipine 10 MG tablet  Commonly known as:  NORVASC  Take 1 tablet (10 mg total) by mouth daily.     amoxicillin-clavulanate 600-42.9 MG/5ML suspension  Commonly known as:  AUGMENTIN ES-600  Take 7.3 mLs (875 mg total) by mouth 2 (two) times daily.     blood glucose meter kit and supplies Kit  Dispense based on patient and insurance preference. Use up to four times daily as directed. (FOR ICD-9 250.00, 250.01).     buprenorphine-naloxone 8-2 MG Subl SL tablet  Commonly known as:  SUBOXONE  Place 1 tablet under the tongue 2 (two) times daily.     cyclobenzaprine 10 MG tablet  Commonly known as:  FLEXERIL  Take 10 mg by mouth at bedtime as needed for muscle spasms.     ibuprofen 200 MG tablet  Commonly known as:  ADVIL,MOTRIN  Take 400-600 mg by mouth every 6 (six) hours as needed for fever, headache or moderate pain.     insulin aspart 100 UNIT/ML FlexPen  Commonly known as:  NOVOLOG FLEXPEN  Give yourself 3 units before each meal.     Insulin Glargine 100 UNIT/ML Solostar Pen  Commonly known as:  LANTUS SOLOSTAR  Inject 34 Units into the skin daily. At 10AM     Pen Needles 3/16" 31G X  5 MM Misc  Use as directed.     pregabalin 75 MG capsule  Commonly known as:  LYRICA  Take 75 mg by mouth 4 (four) times daily.     senna-docusate 8.6-50 MG per tablet  Commonly known as:  Senokot-S  Take 1 tablet by mouth 2 (two) times daily. While taking pain meds to prevent constipation.        Disposition and follow-up:   [name]  At the hospital follow up visit please address:  1.  Infection of supraglottis: Painful L sided cellulitis superior to the epiglottis. Discharged from hospital on augmentin after 24hr observation to rule out potentially dangerous laryngeal edema. Patient also reported concern for L submandibular salivary gland swelling preceding her admission symptoms. Please follow up on symptom resolution.  2.  Incidental lung nodule, less than or equal to 341m In R upper lob incidentally noticed on CT scan 7/22. If patient is deemed high clinical risk due to smoking history or symptoms 1 year follow up CT would be most appropriate screening.  Follow-up Appointments:     Follow-up Information    Follow up with EDWARDS, MICHELLE P, NP. Go on 10/11/2014.   Specialty:  Internal Medicine   Why:  @ 1:30pm, Follow up for resolution of infection symptoms  Contact information:   [place] [place] [id number] [phone]       Discharge Instructions:   Consultations: Treatment Team:  Izora Gala, MD  Procedures Performed:  Ct Soft Tissue Neck W Contrast  09/16/2014   CLINICAL DATA:  Initial evaluation for hoarseness, sore throat, left shallow pain.  EXAM: CT NECK WITH CONTRAST  TECHNIQUE: Multidetector CT imaging of the neck was performed using the standard protocol following the bolus administration of intravenous contrast.  CONTRAST:  81m OMNIPAQUE IOHEXOL 300 MG/ML  SOLN  COMPARISON:  None available.  FINDINGS: Visualized portions of the brain demonstrate no acute abnormality.  Probable DVA present within the left cerebellar hemisphere. Visualized globes and orbits demonstrate no acute abnormality.  Paranasal sinuses are well pneumatized and free of fluid. No mastoid effusion. Middle ear cavities are clear.  Parotid glands are symmetric in appearance bilaterally and within normal limits. The right submandibular gland appears to be absent. Left submandibular gland within normal limits without acute inflammatory changes.  Oral cavity within normal limits. No mass lesion or loculated fluid collection. Patient is edentulous. No significant inflammatory changes present about the mandible or within the oral cavity. Palatine tonsils are symmetric and normal bilaterally. Parapharyngeal fat is well preserved. Oropharynx and nasopharynx within normal limits. Epiglottis grossly normal. Vallecula is clear. Lingual tonsils within normal limits.  No retropharyngeal fluid collection. There is fullness with asymmetric edema within the left supraglottic larynx/hypopharynx at the level of the false vocal cords and laryngeal ventricles (series 2, image 59). There is superior extension towards the left area epiglottic fold and piriform sinus. Finding is suggestive of possible acute pharyngitis. Mild mass effect on the adjacent airway which is narrowed to 7 mm at this level. No significant inflammatory changes within the adjacent soft tissues of the neck. No discrete or loculated rim enhancing fluid collection.  Subglottic airway is clear.  Mildly prominent right level IIa node measures 12 mm in short axis. No other pathologically enlarged lymph nodes identified within the neck.  Visualized superior mediastinum within normal limits.  3 mm nodule present within the peripheral right upper lobe (series 2, image 100), indeterminate. The visualized lungs are otherwise clear.  Normal intravascular enhancement seen within the neck.  Reversal of the normal cervical lordosis with moderate degenerative disc disease at  C5-6 and C6-7. No acute osseous abnormality. No worrisome lytic or blastic osseous lesions.  IMPRESSION: 1. Mild soft tissue fullness and edema within the left supraglottic larynx/hypopharynx as above, most suggestive of acute pharyngitis. There is mild mass effect on the adjacent pharyngeal airway which remains patent. No discrete or rim enhancing fluid collection to suggest abscess. Clinical followup to resolution is recommended. 2. No other acute abnormality within the neck. 3. 3 mm right upper lobe pulmonary nodule, indeterminate. If the patient is at high risk for bronchogenic carcinoma, follow-up chest CT at 1 year is recommended. If the patient is at low risk, no follow-up is needed. This recommendation follows the consensus statement: Guidelines for Management of Small Pulmonary Nodules Detected on CT Scans: A Statement from the [place] published in Radiology 2005; 237:395-400.   Electronically Signed   By: BJeannine BogaM.D.   On: 09/16/2014 03:44   Admission HPI: Maureena F Hitchens is a 59yo F with PMH of HTN, DM, COPD who presents with CC of sore throat. She reports her throat irritation first started in January, she did bring it up to her PCP twice but apparently it was felt  to be allergies. She then went to a dentist a little over a week ago to be evaluated her referred her to an oral Psychologist, sport and exercise. She reports that the oral surgeon (who she went to 1 week ago) said she needed to be evaluated in the hospital and told her to go to the emergency room. She reports that she was busy taking care of grandchildren and waited to today when the throat/jaw pain became unbearable to come to be evaluated in the ED. She reports that the sore throat and pain really became worse over about the past 2 weeks, she has had some associated dysphasia with liquids being particuarrly hard to swallow. She does note that she had a salivary duct stone which caused a similar pain and blockage about 20 years ago  which needed to be surgically removed.  She reports that her DM is currently well controlled with last A1c of 6%. She does report 45 lbs of weight loss she attributes this to a diabetic diet and cutting out junk foods. She is a current 1 ppd smoker, and has overall smoked 1.5 ppd for 44 years.  Hospital Course by problem list: Infection of supraglottis (throat): Admitted with odynophagia and tenderness to palpation on L aspect of throat. No stridor or shortness of breath. Head CT obtained showing cellulitis without abscess of the L supraglottis area. ENT service performed laryngoscopic evaluation and recommended IV abx with inpatient observation for 24 hours. IV unasyn started. Odynophagia improved and tolerating solid diet, discharged home on 7/23 to take 7 days augmentin and follow up with her PCP on 8/16 or sooner if symptoms fail to improve.  Incidental lung nodule, less than or equal to 48m: Head/neck CT obtained at ED noted 357mnonspecific nodule in the R upper lobe. Will refer findings to PCP and recommend 1 year repeat scan if patient is deemed high risk.  Discharge Vitals:   BP 158/73 mmHg  Pulse 64  Temp(Src) 98.3 F (36.8 C) (Oral)  Resp 18  Ht 5' 6" (1.676 m)  Wt 107.23 kg (236 lb 6.4 oz)  BMI 38.17 kg/m2  SpO2 99%  Discharge Labs:  No results found for this or any previous visit (from the past 24 hour(s)).  Signed: ChCollier SalinaMD 09/20/2014, 1:20 PM

## 2014-09-18 LAB — CULTURE, GROUP A STREP: STREP A CULTURE: POSITIVE — AB

## 2015-03-02 ENCOUNTER — Encounter (HOSPITAL_COMMUNITY): Payer: Self-pay | Admitting: Neurology

## 2015-03-02 ENCOUNTER — Emergency Department (HOSPITAL_COMMUNITY)
Admission: EM | Admit: 2015-03-02 | Discharge: 2015-03-02 | Disposition: A | Discharge status: Home / Self Care | Payer: Medicaid Other | Attending: Emergency Medicine | Admitting: Emergency Medicine

## 2015-03-02 ENCOUNTER — Emergency Department (HOSPITAL_COMMUNITY): Payer: Medicaid Other

## 2015-03-02 DIAGNOSIS — E11621 Type 2 diabetes mellitus with foot ulcer: Secondary | ICD-10-CM | POA: Diagnosis not present

## 2015-03-02 DIAGNOSIS — R0602 Shortness of breath: Secondary | ICD-10-CM

## 2015-03-02 DIAGNOSIS — Z6841 Body Mass Index (BMI) 40.0 and over, adult: Secondary | ICD-10-CM | POA: Insufficient documentation

## 2015-03-02 DIAGNOSIS — I10 Essential (primary) hypertension: Secondary | ICD-10-CM | POA: Diagnosis not present

## 2015-03-02 DIAGNOSIS — Z79899 Other long term (current) drug therapy: Secondary | ICD-10-CM | POA: Diagnosis not present

## 2015-03-02 DIAGNOSIS — M1990 Unspecified osteoarthritis, unspecified site: Secondary | ICD-10-CM | POA: Diagnosis not present

## 2015-03-02 DIAGNOSIS — E1140 Type 2 diabetes mellitus with diabetic neuropathy, unspecified: Secondary | ICD-10-CM | POA: Diagnosis not present

## 2015-03-02 DIAGNOSIS — F17210 Nicotine dependence, cigarettes, uncomplicated: Secondary | ICD-10-CM | POA: Diagnosis not present

## 2015-03-02 DIAGNOSIS — J441 Chronic obstructive pulmonary disease with (acute) exacerbation: Secondary | ICD-10-CM | POA: Insufficient documentation

## 2015-03-02 DIAGNOSIS — Z794 Long term (current) use of insulin: Secondary | ICD-10-CM | POA: Diagnosis not present

## 2015-03-02 DIAGNOSIS — L97529 Non-pressure chronic ulcer of other part of left foot with unspecified severity: Secondary | ICD-10-CM | POA: Insufficient documentation

## 2015-03-02 DIAGNOSIS — Z87448 Personal history of other diseases of urinary system: Secondary | ICD-10-CM | POA: Diagnosis not present

## 2015-03-02 DIAGNOSIS — Z8669 Personal history of other diseases of the nervous system and sense organs: Secondary | ICD-10-CM | POA: Insufficient documentation

## 2015-03-02 DIAGNOSIS — Z792 Long term (current) use of antibiotics: Secondary | ICD-10-CM | POA: Diagnosis not present

## 2015-03-02 DIAGNOSIS — Z87442 Personal history of urinary calculi: Secondary | ICD-10-CM | POA: Diagnosis not present

## 2015-03-02 LAB — CBC WITH DIFFERENTIAL/PLATELET
Basophils Absolute: 0.1 10*3/uL (ref 0.0–0.1)
Basophils Relative: 1 %
Eosinophils Absolute: 0.2 10*3/uL (ref 0.0–0.7)
Eosinophils Relative: 3 %
HCT: 45.3 % (ref 36.0–46.0)
Hemoglobin: 14.5 g/dL (ref 12.0–15.0)
Lymphocytes Relative: 29 %
Lymphs Abs: 1.9 10*3/uL (ref 0.7–4.0)
MCH: 28 pg (ref 26.0–34.0)
MCHC: 32 g/dL (ref 30.0–36.0)
MCV: 87.5 fL (ref 78.0–100.0)
Monocytes Absolute: 0.3 10*3/uL (ref 0.1–1.0)
Monocytes Relative: 4 %
Neutro Abs: 4.2 10*3/uL (ref 1.7–7.7)
Neutrophils Relative %: 63 %
Platelets: 178 10*3/uL (ref 150–400)
RBC: 5.18 MIL/uL — ABNORMAL HIGH (ref 3.87–5.11)
RDW: 13.5 % (ref 11.5–15.5)
WBC: 6.5 10*3/uL (ref 4.0–10.5)

## 2015-03-02 LAB — BASIC METABOLIC PANEL
Anion gap: 8 (ref 5–15)
BUN: 25 mg/dL — ABNORMAL HIGH (ref 6–20)
CO2: 28 mmol/L (ref 22–32)
Calcium: 9 mg/dL (ref 8.9–10.3)
Chloride: 105 mmol/L (ref 101–111)
Creatinine, Ser: 1.17 mg/dL — ABNORMAL HIGH (ref 0.44–1.00)
GFR calc Af Amer: 59 mL/min — ABNORMAL LOW (ref >60)
GFR calc non Af Amer: 51 mL/min — ABNORMAL LOW (ref >60)
Glucose, Bld: 134 mg/dL — ABNORMAL HIGH (ref 65–99)
Potassium: 5.1 mmol/L (ref 3.5–5.1)
Sodium: 141 mmol/L (ref 135–145)

## 2015-03-02 LAB — I-STAT TROPONIN, ED: Troponin i, poc: 0.01 ng/mL (ref 0.00–0.08)

## 2015-03-02 MED ORDER — IPRATROPIUM-ALBUTEROL 0.5-2.5 (3) MG/3ML IN SOLN
3.0000 mL | Freq: Once | RESPIRATORY_TRACT | Status: AC
  Administered 2015-03-02: 3 mL via RESPIRATORY_TRACT
  Filled 2015-03-02: qty 3

## 2015-03-02 MED ORDER — ALBUTEROL SULFATE HFA 108 (90 BASE) MCG/ACT IN AERS: 1.0000 | INHALATION_SPRAY | Freq: Four times a day (QID) | RESPIRATORY_TRACT | Status: DC | PRN

## 2015-03-02 MED ORDER — DEXAMETHASONE SODIUM PHOSPHATE 10 MG/ML IJ SOLN
10.0000 mg | Freq: Once | INTRAMUSCULAR | Status: AC
  Administered 2015-03-02: 10 mg via INTRAVENOUS
  Filled 2015-03-02: qty 1

## 2015-03-02 MED ORDER — AMLODIPINE BESYLATE 5 MG PO TABS
10.0000 mg | ORAL_TABLET | Freq: Once | ORAL | Status: AC
  Administered 2015-03-02: 10 mg via ORAL
  Filled 2015-03-02: qty 2

## 2015-03-02 MED ORDER — PREDNISONE 20 MG PO TABS: 40.0000 mg | ORAL_TABLET | Freq: Every day | ORAL | Status: DC

## 2015-03-02 NOTE — ED Notes (Signed)
Pt reports cough and URI for 1 month, over past few days has been feeling sob and feeling like her heart is racing. She feels like she can hear her heartbeat in her ears.

## 2015-03-02 NOTE — Discharge Instructions (Signed)
You were seen in the emergency room today for evaluation of shortness of breath. Your labs and chest x-ray were normal. I will give you a prescription for steroids to take for the next few days as well as an albuterol inhaler to use as needed. Please keep using your [facility] as prescribed. Rinse your mouth after each use to prevent thrush. Please follow up with your primary care provider within one week. Please also talk to your primary care provider about your foot ulcer. Return to the emergency room for new or worsening symptoms.

## 2015-03-02 NOTE — ED Provider Notes (Signed)
CSN: [id number]     Arrival date & time 03/02/15  1206 History   First MD Initiated Contact with Patient 03/02/15 1745     Chief Complaint  Patient presents with  . Shortness of Breath    HPI   Ms. [name] is an 60 y.o. female with history of COPD, HTN, insulin-dependent DM, and obesity who presents to the ED for evaluation of SOB. She states her symptoms began about three weeks ago when she started having symptoms of a cold including cough and congestion. She states her cough and congestion have persisted. Reports cough is productive of grey-white sputum and is increased from baseline. She states that for the past three weeks she has also noticed increased SOB. She states she feels like she cannot catch her breath after coughing. She states she also has been noticing she has to take deep breaths in the middle of a sentence. In the room she is pausing throughout our conversation to take a breath mid-sentence. She also reports increased DOE. States she gets short of breath walking to the bathroom. Pt states she has been out of her albuterol rescue inhaler but feels like she needs it. She states she has Dulera at home but does not like taking it as it causes thrush. She denies fever. Endorses chills but states this is chronic. She also reports intermittent chest pain and palpitations for the past three weeks. She states her chest hurts all over and is worse with coughing and deep breaths. Denies diaphoresis. Denies feeling weak or dizzy.   Pt's BP was initially elevated to >200/100 in triage but has improved in the room. She states she normally takes Norvasc but has been out of her BP meds for the past three months.   Pt is also concerned about a diabetic foot ulcer on her left foot. She states she noticed a few days ago that she has a wound on the bottom of her foot that she did not know was there before as she has diabetic polyneuropathy. Denies any pain. Denies swelling, erythema, discharge, or  bleeding.   Past Medical History  Diagnosis Date  . Hypertension   . COPD (chronic obstructive pulmonary disease) ([place])   . Shortness of breath   . Arthritis   . Diabetic neuropathy ([place])   . Rash, skin     face  . Kidney stone   . Sleep apnea     has not used cpap past 10 yrs  . Nocturia   . Urge incontinence   . Morbid obesity with BMI of 40.0-44.9, adult ([place])   . Renal insufficiency   . Diabetes mellitus without complication ([place])     INSULIN DEPENDENT  . Neuropathy [facility])    Past Surgical History  Procedure Laterality Date  . Cholecystectomy    . Tubal ligation    . Carpel tunnel    . Cystoscopy w/ ureteral stent placement Left 03/22/2014    Procedure: CYSTOSCOPY WITH RETROGRADE PYELOGRAM/URETERAL STENT PLACEMENT;  Surgeon: Alexis Frock, MD;  Location: [place];  Service: Urology;  Laterality: Left;  . Breast surgery  1982    LUMPECTOMY / benign  . Salivary stone removal    . Cystoscopy with retrograde pyelogram, ureteroscopy and stent placement Left 05/11/2014    Procedure: [place], URETEROSCOPY AND STENT PLACEMENT;  Surgeon: Alexis Frock, MD;  Location[facility];  Service: Urology;  Laterality: Left;  . Holmium laser application Left 2/29/7989    Procedure: HOLMIUM LASER APPLICATION;  Surgeon:  Alexis Frock, MD;  Location[facility];  Service: Urology;  Laterality: Left;   Family History  Problem Relation Age of Onset  . Adopted: Yes   Social History  Substance Use Topics  . Smoking status: Current Every Day Smoker -- 1.00 packs/day for 40 years    Types: Cigarettes  . Smokeless tobacco: Never Used  . Alcohol Use: No   OB History    No data available     Review of Systems  All other systems reviewed and are negative.     Allergies  Ace inhibitors  Home Medications   Prior to Admission medications   Medication Sig Start Date End Date Taking? Authorizing Provider  albuterol (PROVENTIL HFA;VENTOLIN HFA) 108 (90 BASE) MCG/ACT  inhaler Inhale 2 puffs into the lungs every 4 (four) hours as needed for wheezing or shortness of breath. 08/10/12   Hosie Poisson, MD  amLODipine (NORVASC) 10 MG tablet Take 1 tablet (10 mg total) by mouth daily. 03/24/14   Bonnielee Haff, MD  amoxicillin-clavulanate (AUGMENTIN ES-600) 600-42.9 MG/5ML suspension Take 7.3 mLs (875 mg total) by mouth 2 (two) times daily. 09/17/14   Ejiroghene Arlyce Dice, MD  blood glucose meter kit and supplies KIT Dispense based on patient and insurance preference. Use up to four times daily as directed. (FOR ICD-9 250.00, 250.01). 03/24/14   Bonnielee Haff, MD  buprenorphine-naloxone (SUBOXONE) 8-2 MG SUBL SL tablet Place 1 tablet under the tongue 2 (two) times daily.    Historical Provider, MD  cyclobenzaprine (FLEXERIL) 10 MG tablet Take 10 mg by mouth at bedtime as needed for muscle spasms.    Historical Provider, MD  ibuprofen (ADVIL,MOTRIN) 200 MG tablet Take 400-600 mg by mouth every 6 (six) hours as needed for fever, headache or moderate pain.     Historical Provider, MD  insulin aspart (NOVOLOG FLEXPEN) 100 UNIT/ML FlexPen Give yourself 3 units before each meal. 03/24/14   Bonnielee Haff, MD  Insulin Glargine (LANTUS SOLOSTAR) 100 UNIT/ML Solostar Pen Inject 34 Units into the skin daily. At 10AM 03/24/14   Bonnielee Haff, MD  Insulin Pen Needle (PEN NEEDLES 3/16") 31G X 5 MM MISC Use as directed. 03/24/14   Bonnielee Haff, MD  pregabalin (LYRICA) 75 MG capsule Take 75 mg by mouth 4 (four) times daily.    Historical Provider, MD  senna-docusate (SENOKOT-S) 8.6-50 MG per tablet Take 1 tablet by mouth 2 (two) times daily. While taking pain meds to prevent constipation. 05/11/14   Alexis Frock, MD   BP 202/108 mmHg  Pulse 94  Temp(Src) 97.9 F (36.6 C) (Oral)  Resp 20  SpO2 99% Physical Exam  Constitutional: She is oriented to person, place, and time.  Obese, chronically ill appearing, NAD  HENT:  Right Ear: External ear normal.  Left Ear: External ear normal.   Nose: Nose normal.  Mouth/Throat: Oropharynx is clear and moist. No oropharyngeal exudate.  Eyes: Conjunctivae and EOM are normal. Pupils are equal, round, and reactive to light.  Neck: Normal range of motion. Neck supple.  Cardiovascular: Normal rate, regular rhythm, normal heart sounds and intact distal pulses.   Pulmonary/Chest:  Some increased WOB. Pausing mid-sentence to take a breath. Bilateral rhonchi and end-expiratory wheezing in lower lung bases.   Abdominal: Soft. Bowel sounds are normal. She exhibits no distension. There is no tenderness. There is no rebound and no guarding.  Musculoskeletal: She exhibits no edema.  Neurological: She is alert and oriented to person, place, and time. No cranial nerve deficit.  Skin:  Skin is warm and dry.  Left foot with diabetic foot ulcer on plantar surface of foot. No erythema. No discharge. No tenderness. Intact distal pulses.  Psychiatric: She has a normal mood and affect.  Nursing note and vitals reviewed.   Filed Vitals:   03/02/15 1815 03/02/15 1830 03/02/15 1900 03/02/15 1930  BP: 148/94 165/85 166/76 167/82  Pulse: 83 87 87 85  Temp:      TempSrc:      Resp: _0 SpO2: 97% 94% 91% 95%     ED Course  Procedures (including critical care time) Labs Review Labs Reviewed  CBC WITH DIFFERENTIAL/PLATELET - Abnormal; Notable for the following:    RBC 5.18 (*)    All other components within normal limits  BASIC METABOLIC PANEL - Abnormal; Notable for the following:    Glucose, Bld 134 (*)    BUN 25 (*)    Creatinine, Ser 1.17 (*)    GFR calc non Af Amer 51 (*)    GFR calc Af Amer 59 (*)    All other components within normal limits  I-STAT TROPOININ, ED    Imaging Review Dg Chest 2 View  03/02/2015  CLINICAL DATA:  Productive cough with cold and congestion for 3 weeks EXAM: CHEST  2 VIEW COMPARISON:  03/20/2014 FINDINGS: Normal heart size and mediastinal contours. No acute infiltrate or edema. No effusion or  pneumothorax. Prominent thoracic spondylosis for age. No acute osseous findings. IMPRESSION: No active cardiopulmonary disease. Electronically Signed   By: Monte Fantasia M.D.   On: 03/02/2015 13:16   I have personally reviewed and evaluated these images and lab results as part of my medical decision-making.   EKG Interpretation   Date/Time:  Thursday March 02 2015 12:17:36 EST Ventricular Rate:  99 PR Interval:  190 QRS Duration: 96 QT Interval:  356 QTC Calculation: 456 R Axis:   -60 Text Interpretation:  Normal sinus rhythm Right atrial enlargement  Incomplete right bundle branch block Left anterior fascicular block Cannot  rule out Anteroseptal infarct , age undetermined Abnormal ECG No  significant change since last tracing Confirmed by LITTLE MD, [place]  [phone]) on 03/02/2015 6:00:49 PM      MDM   Final diagnoses:  Shortness of breath  Diabetic ulcer of left foot associated with type 2 diabetes mellitus ([place])    Pt had some improvement to her wheezing after initial duoneb. However, she de-satted to 87% after ambulation to bathroom. She does still have some wheezing. Will give another duoneb and decadron. Started on  2L O2 via [place].  Pt feels much more improved after second breathing treatment. Her wheezing has cleared. She is able to ambulate while maintaining appropriate SpO2. Likely COPD exacerbation due to URI/bronchitis, made worse with medication non-compliance. Instructed pt to take [facility] as prescribed and rinse mouth after each use to prevent thrush. Will give rx for albuterol. Will give rx for prednisone. Instructed to f/u with PCP. ER return precautions given.    Anne Ng, PA-C 03/03/15 [place], MD 03/13/15 2107

## 2015-03-02 NOTE — ED Notes (Signed)
Went to pt's room to go over discharge instructions, pt and her belongings were gone. Did not go over discharge instructions, discharge prescriptions not given.

## 2015-03-31 ENCOUNTER — Encounter (HOSPITAL_COMMUNITY): Payer: Self-pay | Admitting: Emergency Medicine

## 2015-03-31 ENCOUNTER — Emergency Department (HOSPITAL_COMMUNITY)
Admission: EM | Admit: 2015-03-31 | Discharge: 2015-04-01 | Disposition: A | Discharge status: Home / Self Care | Payer: Medicaid Other | Attending: Emergency Medicine | Admitting: Emergency Medicine

## 2015-03-31 ENCOUNTER — Emergency Department (HOSPITAL_COMMUNITY): Payer: Medicaid Other

## 2015-03-31 DIAGNOSIS — Z79899 Other long term (current) drug therapy: Secondary | ICD-10-CM | POA: Insufficient documentation

## 2015-03-31 DIAGNOSIS — E1140 Type 2 diabetes mellitus with diabetic neuropathy, unspecified: Secondary | ICD-10-CM | POA: Insufficient documentation

## 2015-03-31 DIAGNOSIS — Z6841 Body Mass Index (BMI) 40.0 and over, adult: Secondary | ICD-10-CM | POA: Insufficient documentation

## 2015-03-31 DIAGNOSIS — Y9289 Other specified places as the place of occurrence of the external cause: Secondary | ICD-10-CM | POA: Insufficient documentation

## 2015-03-31 DIAGNOSIS — I10 Essential (primary) hypertension: Secondary | ICD-10-CM | POA: Diagnosis present

## 2015-03-31 DIAGNOSIS — I159 Secondary hypertension, unspecified: Secondary | ICD-10-CM

## 2015-03-31 DIAGNOSIS — Z79891 Long term (current) use of opiate analgesic: Secondary | ICD-10-CM | POA: Insufficient documentation

## 2015-03-31 DIAGNOSIS — Z87448 Personal history of other diseases of urinary system: Secondary | ICD-10-CM | POA: Insufficient documentation

## 2015-03-31 DIAGNOSIS — G629 Polyneuropathy, unspecified: Secondary | ICD-10-CM | POA: Insufficient documentation

## 2015-03-31 DIAGNOSIS — J449 Chronic obstructive pulmonary disease, unspecified: Secondary | ICD-10-CM | POA: Diagnosis not present

## 2015-03-31 DIAGNOSIS — Z794 Long term (current) use of insulin: Secondary | ICD-10-CM | POA: Insufficient documentation

## 2015-03-31 DIAGNOSIS — Y998 Other external cause status: Secondary | ICD-10-CM | POA: Diagnosis not present

## 2015-03-31 DIAGNOSIS — Z7952 Long term (current) use of systemic steroids: Secondary | ICD-10-CM | POA: Insufficient documentation

## 2015-03-31 DIAGNOSIS — W19XXXA Unspecified fall, initial encounter: Secondary | ICD-10-CM

## 2015-03-31 DIAGNOSIS — Z87442 Personal history of urinary calculi: Secondary | ICD-10-CM | POA: Diagnosis not present

## 2015-03-31 DIAGNOSIS — W1839XA Other fall on same level, initial encounter: Secondary | ICD-10-CM | POA: Insufficient documentation

## 2015-03-31 DIAGNOSIS — M1990 Unspecified osteoarthritis, unspecified site: Secondary | ICD-10-CM | POA: Diagnosis not present

## 2015-03-31 DIAGNOSIS — Y9301 Activity, walking, marching and hiking: Secondary | ICD-10-CM | POA: Insufficient documentation

## 2015-03-31 DIAGNOSIS — S79912A Unspecified injury of left hip, initial encounter: Secondary | ICD-10-CM | POA: Diagnosis not present

## 2015-03-31 MED ORDER — AMLODIPINE BESYLATE 10 MG PO TABS: 10.0000 mg | ORAL_TABLET | Freq: Every day | ORAL | Status: DC

## 2015-03-31 MED ORDER — AMLODIPINE BESYLATE 5 MG PO TABS
10.0000 mg | ORAL_TABLET | Freq: Once | ORAL | Status: AC
  Administered 2015-03-31: 10 mg via ORAL
  Filled 2015-03-31: qty 2

## 2015-03-31 MED ORDER — IBUPROFEN 200 MG PO TABS
600.0000 mg | ORAL_TABLET | Freq: Once | ORAL | Status: AC
  Administered 2015-03-31: 600 mg via ORAL
  Filled 2015-03-31: qty 3

## 2015-03-31 NOTE — ED Provider Notes (Signed)
CSN: [id number]     Arrival date & time 03/31/15  2135 History  By signing my name below, I, Emmanuella Mensah, attest that this documentation has been prepared under the direction and in the presence of Everlene Balls, MD. Electronically Signed: Judithann Sauger, ED Scribe. 03/31/2015. 11:19 PM.    Chief Complaint  Patient presents with  . Hypertension  . Hip Pain   The history is provided by the patient. No language interpreter was used.   HPI Comments: [name] is a 60 y.o. female who presents to the Emergency Department complaining of HTN with her bp being approx. 240/160 PTA. She explains that she normally takes Norvasc 10 but has finished them. Pt states that her PCP informed her today that she was in an HTN crisis and advised to come to the ED for evaluation and treatment. 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   She also reports ongoing constant moderate left gluteal pain s/p fall that occurred tonight. She explains that she was walking outside with her husband when she fell on the concrete. She denies hitting her head or LOC.  No alleviating factors noted. Pt has not taken any medication for these symptoms. No fever, numbness, or tingling.   Past Medical History  Diagnosis Date  . Hypertension   . COPD (chronic obstructive pulmonary disease) ([place])   . Shortness of breath   . Arthritis   . Diabetic neuropathy ([place])   . Rash, skin     face  . Kidney stone   . Sleep apnea     has not used cpap past 10 yrs  . Nocturia   . Urge incontinence   . Morbid obesity with BMI of 40.0-44.9, adult ([place])   . Renal insufficiency   . Diabetes mellitus without complication ([place])     INSULIN DEPENDENT  . Neuropathy [facility])    Past Surgical History  Procedure Laterality Date  . Cholecystectomy    . Tubal ligation    . Carpel tunnel    . Cystoscopy w/ ureteral stent placement Left 03/22/2014    Procedure: CYSTOSCOPY WITH RETROGRADE PYELOGRAM/URETERAL STENT PLACEMENT;  Surgeon:  Alexis Frock, MD;  Location: [place];  Service: Urology;  Laterality: Left;  . Breast surgery  1982    LUMPECTOMY / benign  . Salivary stone removal    . Cystoscopy with retrograde pyelogram, ureteroscopy and stent placement Left 05/11/2014    Procedure: [place], URETEROSCOPY AND STENT PLACEMENT;  Surgeon: Alexis Frock, MD;  [facility];  Service: Urology;  Laterality: Left;  . Holmium laser application Left 2/50/5397    Procedure: HOLMIUM LASER APPLICATION;  Surgeon: Alexis Frock, MD;  [facility];  Service: Urology;  Laterality: Left;   Family History  Problem Relation Age of Onset  . Adopted: Yes   Social History  Substance Use Topics  . Smoking status: Current Every Day Smoker -- 1.00 packs/day for 40 years    Types: Cigarettes  . Smokeless tobacco: Never Used  . Alcohol Use: No   OB History    No data available     Review of Systems  Constitutional: Negative for fever.  Musculoskeletal: Positive for arthralgias (left gluteal pain).  Neurological: Negative for weakness and numbness.  All other systems reviewed and are negative.  A complete 10 system review of systems was obtained and all systems are negative except as noted in the HPI and PMH.      Allergies  Ace inhibitors  and Zestril  Home Medications   Prior to Admission medications   Medication Sig Start Date End Date Taking? Authorizing Provider  albuterol (PROVENTIL HFA;VENTOLIN HFA) 108 (90 BASE) MCG/ACT inhaler Inhale 2 puffs into the lungs every 4 (four) hours as needed for wheezing or shortness of breath. 08/10/12   Hosie Poisson, MD  albuterol (PROVENTIL HFA;VENTOLIN HFA) 108 (90 Base) MCG/ACT inhaler Inhale 1-2 puffs into the lungs every 6 (six) hours as needed for wheezing or shortness of breath. 03/02/15   Olivia Canter Sam, PA-C  amLODipine (NORVASC) 10 MG tablet Take 1 tablet (10 mg total) by mouth daily. Patient not taking: Reported on 03/02/2015 03/24/14   Bonnielee Haff,  MD  amoxicillin-clavulanate (AUGMENTIN ES-600) 600-42.9 MG/5ML suspension Take 7.3 mLs (875 mg total) by mouth 2 (two) times daily. Patient not taking: Reported on 03/02/2015 09/17/14   Ejiroghene Arlyce Dice, MD  blood glucose meter kit and supplies KIT Dispense based on patient and insurance preference. Use up to four times daily as directed. (FOR ICD-9 250.00, 250.01). 03/24/14   Bonnielee Haff, MD  buprenorphine-naloxone (SUBOXONE) 8-2 MG SUBL SL tablet Place 1 tablet under the tongue 2 (two) times daily.    Historical Provider, MD  cyclobenzaprine (FLEXERIL) 10 MG tablet Take 10 mg by mouth at bedtime as needed for muscle spasms.    Historical Provider, MD  docusate sodium (COLACE) 100 MG capsule Take 100 mg by mouth daily as needed for mild constipation.    Historical Provider, MD  ibuprofen (ADVIL,MOTRIN) 200 MG tablet Take 400-600 mg by mouth every 6 (six) hours as needed for fever, headache or moderate pain.     Historical Provider, MD  insulin aspart (NOVOLOG FLEXPEN) 100 UNIT/ML FlexPen Give yourself 3 units before each meal. 03/24/14   Bonnielee Haff, MD  Insulin Glargine (LANTUS SOLOSTAR) 100 UNIT/ML Solostar Pen Inject 34 Units into the skin daily. At 10AM 03/24/14   Bonnielee Haff, MD  Insulin Pen Needle (PEN NEEDLES 3/16") 31G X 5 MM MISC Use as directed. 03/24/14   Bonnielee Haff, MD  Multiple Vitamin (MULTIVITAMIN) tablet Take 1 tablet by mouth daily.    Historical Provider, MD  predniSONE (DELTASONE) 20 MG tablet Take 2 tablets (40 mg total) by mouth daily. 03/02/15   Olivia Canter Sam, PA-C  pregabalin (LYRICA) 75 MG capsule Take 75 mg by mouth 4 (four) times daily.    Historical Provider, MD  senna-docusate (SENOKOT-S) 8.6-50 MG per tablet Take 1 tablet by mouth 2 (two) times daily. While taking pain meds to prevent constipation. Patient not taking: Reported on 03/02/2015 05/11/14   Alexis Frock, MD   BP 188/97 mmHg  Pulse 79  Temp(Src) 98 F (36.7 C) (Oral)  Resp 16  Ht 5' 6"  (1.676 m)   Wt 220 lb (99.791 kg)  BMI 35.53 kg/m2  SpO2 95% Physical Exam  Constitutional: She is oriented to person, place, and time. She appears well-developed and well-nourished. No distress.  HENT:  Head: Normocephalic and atraumatic.  Nose: Nose normal.  Mouth/Throat: Oropharynx is clear and moist. No oropharyngeal exudate.  Eyes: Conjunctivae and EOM are normal. Pupils are equal, round, and reactive to light. No scleral icterus.  Neck: Normal range of motion. Neck supple. No JVD present. No tracheal deviation present. No thyromegaly present.  Cardiovascular: Normal rate, regular rhythm and normal heart sounds.  Exam reveals no gallop and no friction rub.   No murmur heard. Pulmonary/Chest: Effort normal and breath sounds normal. No respiratory distress. She has no wheezes. She  exhibits no tenderness.  Abdominal: Soft. Bowel sounds are normal. She exhibits no distension and no mass. There is no tenderness. There is no rebound and no guarding.  Musculoskeletal: Normal range of motion. She exhibits tenderness. She exhibits no edema.  Mild tenderness to posterior Left hp Normal sensation and pulses distally  Lymphadenopathy:    She has no cervical adenopathy.  Neurological: She is alert and oriented to person, place, and time. No cranial nerve deficit. She exhibits normal muscle tone.  Normal strength and sensation in all extremities, normal cerebellar testing.  Skin: Skin is warm and dry. No rash noted. No erythema. No pallor.  Nursing note and vitals reviewed.   ED Course  Procedures (including critical care time) DIAGNOSTIC STUDIES: Oxygen Saturation is 95% on RA, adequate by my interpretation.    COORDINATION OF CARE: 11:01 PM- Pt advised of plan for treatment and pt agrees. Pt will receive an x-ray for further evaluation. She will also receive Norvasc.    Labs Review Labs Reviewed - No data to display  Imaging Review Dg Hip Unilat With Pelvis 2-3 Views Left  03/31/2015  CLINICAL  DATA:  Fall today onto left hip on cement porch, now with posterior left hip pain. EXAM: DG HIP (WITH OR WITHOUT PELVIS) 2-3V LEFT COMPARISON:  None. FINDINGS: The cortical margins of the bony pelvis are intact. No fracture. Pubic symphysis and sacroiliac joints are congruent. Both femoral heads are well-seated in the respective acetabula. There is degenerative change of both hips, moderate on the right and mild on the left IMPRESSION: No fracture of the pelvis or left hip. Electronically Signed   By: Jeb Levering M.D.   On: 03/31/2015 23:34     Everlene Balls, MD has personally reviewed and evaluated these images and lab results as part of his medical decision-making.   EKG Interpretation None      MDM   Final diagnoses:  None   Patient presents to the ED for HTN and a fall.  Here her blood pressure was 148/88 while I was in the room.  Will re start home amlodipine 5m and give one month Rx.  PCP fu advised within 3 days.  XR neg for fracture of the hip.  She appears well and in NAD.  She is asymptomatic from her HTN.  VS remain within her normal limits and she is safe for DC.   I personally performed the services described in this documentation, which was scribed in my presence. The recorded information has been reviewed and is accurate.     AEverlene Balls MD 03/31/15 2350

## 2015-03-31 NOTE — Discharge Instructions (Signed)
How to Take Your Blood Pressure [name], your blood pressure here is 148/88.  Take your norvasc daily for treatment and see your primary care doctor within 3 days for close follow up.  Your xray does not show any broken bones.  For any worsening symptoms, come back to the ED immediately.  Thank you. HOW DO I GET A BLOOD PRESSURE MACHINE?  You can buy an electronic home blood pressure machine at your local pharmacy. Insurance will sometimes cover the cost if you have a prescription.  Ask your doctor what type of machine is best for you. There are different machines for your arm and your wrist.  If you decide to buy a machine to check your blood pressure on your arm, first check the size of your arm so you can buy the right size cuff. To check the size of your arm:   Use a measuring tape that shows both inches and centimeters.   Wrap the measuring tape around the upper-middle part of your arm. You may need someone to help you measure.   Write down your arm measurement in both inches and centimeters.   To measure your blood pressure correctly, it is important to have the right size cuff.   If your arm is up to 13 inches (up to 34 centimeters), get an adult cuff size.  If your arm is 13 to 17 inches (35 to 44 centimeters), get a large adult cuff size.    If your arm is 17 to 20 inches (45 to 52 centimeters), get an adult thigh cuff.  WHAT DO THE NUMBERS MEAN?   There are two numbers that make up your blood pressure. For example: 120/80.  The first number (120 in our example) is called the "systolic pressure." It is a measure of the pressure in your blood vessels when your heart is pumping blood.  The second number (80 in our example) is called the "diastolic pressure." It is a measure of the pressure in your blood vessels when your heart is resting between beats.  Your doctor will tell you what your blood pressure should be. WHAT SHOULD I DO BEFORE I CHECK MY BLOOD PRESSURE?    Try to rest or relax for at least 30 minutes before you check your blood pressure.  Do not smoke.  Do not have any drinks with caffeine, such as:  Soda.  Coffee.  Tea.  Check your blood pressure in a quiet room.  Sit down and stretch out your arm on a table. Keep your arm at about the level of your heart. Let your arm relax.  Make sure that your legs are not crossed. HOW DO I CHECK MY BLOOD PRESSURE?  Follow the directions that came with your machine.  Make sure you remove any tight-fitting clothing from your arm or wrist. Wrap the cuff around your upper arm or wrist. You should be able to fit a finger between the cuff and your arm. If you cannot fit a finger between the cuff and your arm, it is too tight and should be removed and rewrapped.  Some units require you to manually pump up the arm cuff.  Automatic units inflate the cuff when you press a button.  Cuff deflation is automatic in both models.  After the cuff is inflated, the unit measures your blood pressure and pulse. The readings are shown on a monitor. Hold still and breathe normally while the cuff is inflated.  Getting a reading takes less than a  minute.  Some models store readings in a memory. Some provide a printout of readings. If your machine does not store your readings, keep a written record.  Take readings with you to your next visit with your doctor.   This information is not intended to replace advice given to you by your health care provider. Make sure you discuss any questions you have with your health care provider.   Document Released: 01/25/2008 Document Revised: 03/04/2014 Document Reviewed: 04/08/2013 Elsevier Interactive Patient Education [employer].

## 2015-03-31 NOTE — ED Notes (Signed)
Pt from home for eval of HTN, states has been out of meds x3 months and went to PCP today and told she was in "hypertensive crisis" pt has not new complaints at this time other than left hip pain after falling tonight on concrete. Pt states was dark and tripped landing on left side of bottom. nad ntoed. No numbness/tingling noted.

## 2015-04-19 ENCOUNTER — Emergency Department (HOSPITAL_COMMUNITY): Payer: Medicaid Other

## 2015-04-19 ENCOUNTER — Inpatient Hospital Stay (HOSPITAL_COMMUNITY)
Admission: EM | Admit: 2015-04-19 | Discharge: 2015-04-20 | DRG: 638 | Disposition: A | Discharge status: Home / Self Care | Payer: Medicaid Other | Attending: Internal Medicine | Admitting: Internal Medicine

## 2015-04-19 ENCOUNTER — Inpatient Hospital Stay (HOSPITAL_COMMUNITY): Payer: Medicaid Other

## 2015-04-19 ENCOUNTER — Encounter (HOSPITAL_COMMUNITY): Payer: Self-pay | Admitting: Emergency Medicine

## 2015-04-19 DIAGNOSIS — Z9114 Patient's other noncompliance with medication regimen: Secondary | ICD-10-CM | POA: Diagnosis not present

## 2015-04-19 DIAGNOSIS — L97529 Non-pressure chronic ulcer of other part of left foot with unspecified severity: Secondary | ICD-10-CM | POA: Diagnosis present

## 2015-04-19 DIAGNOSIS — M25551 Pain in right hip: Secondary | ICD-10-CM

## 2015-04-19 DIAGNOSIS — L089 Local infection of the skin and subcutaneous tissue, unspecified: Secondary | ICD-10-CM

## 2015-04-19 DIAGNOSIS — E1140 Type 2 diabetes mellitus with diabetic neuropathy, unspecified: Secondary | ICD-10-CM | POA: Diagnosis present

## 2015-04-19 DIAGNOSIS — N1832 Chronic kidney disease, stage 3b: Secondary | ICD-10-CM | POA: Diagnosis present

## 2015-04-19 DIAGNOSIS — E11628 Type 2 diabetes mellitus with other skin complications: Secondary | ICD-10-CM

## 2015-04-19 DIAGNOSIS — F17210 Nicotine dependence, cigarettes, uncomplicated: Secondary | ICD-10-CM | POA: Diagnosis present

## 2015-04-19 DIAGNOSIS — E11621 Type 2 diabetes mellitus with foot ulcer: Principal | ICD-10-CM | POA: Diagnosis present

## 2015-04-19 DIAGNOSIS — E1165 Type 2 diabetes mellitus with hyperglycemia: Secondary | ICD-10-CM | POA: Diagnosis present

## 2015-04-19 DIAGNOSIS — E1169 Type 2 diabetes mellitus with other specified complication: Secondary | ICD-10-CM

## 2015-04-19 DIAGNOSIS — N183 Chronic kidney disease, stage 3 (moderate): Secondary | ICD-10-CM | POA: Diagnosis not present

## 2015-04-19 DIAGNOSIS — M79672 Pain in left foot: Secondary | ICD-10-CM | POA: Diagnosis not present

## 2015-04-19 DIAGNOSIS — G4733 Obstructive sleep apnea (adult) (pediatric): Secondary | ICD-10-CM | POA: Diagnosis present

## 2015-04-19 DIAGNOSIS — Z87891 Personal history of nicotine dependence: Secondary | ICD-10-CM | POA: Diagnosis present

## 2015-04-19 DIAGNOSIS — I129 Hypertensive chronic kidney disease with stage 1 through stage 4 chronic kidney disease, or unspecified chronic kidney disease: Secondary | ICD-10-CM | POA: Diagnosis present

## 2015-04-19 DIAGNOSIS — I10 Essential (primary) hypertension: Secondary | ICD-10-CM | POA: Diagnosis present

## 2015-04-19 DIAGNOSIS — E1121 Type 2 diabetes mellitus with diabetic nephropathy: Secondary | ICD-10-CM | POA: Diagnosis not present

## 2015-04-19 DIAGNOSIS — L97409 Non-pressure chronic ulcer of unspecified heel and midfoot with unspecified severity: Secondary | ICD-10-CM | POA: Diagnosis not present

## 2015-04-19 DIAGNOSIS — E1122 Type 2 diabetes mellitus with diabetic chronic kidney disease: Secondary | ICD-10-CM | POA: Diagnosis present

## 2015-04-19 DIAGNOSIS — J441 Chronic obstructive pulmonary disease with (acute) exacerbation: Secondary | ICD-10-CM | POA: Diagnosis present

## 2015-04-19 DIAGNOSIS — J449 Chronic obstructive pulmonary disease, unspecified: Secondary | ICD-10-CM | POA: Diagnosis present

## 2015-04-19 DIAGNOSIS — Z6841 Body Mass Index (BMI) 40.0 and over, adult: Secondary | ICD-10-CM | POA: Diagnosis not present

## 2015-04-19 DIAGNOSIS — E662 Morbid (severe) obesity with alveolar hypoventilation: Secondary | ICD-10-CM | POA: Diagnosis present

## 2015-04-19 DIAGNOSIS — G894 Chronic pain syndrome: Secondary | ICD-10-CM | POA: Diagnosis not present

## 2015-04-19 DIAGNOSIS — Z794 Long term (current) use of insulin: Secondary | ICD-10-CM

## 2015-04-19 DIAGNOSIS — Z720 Tobacco use: Secondary | ICD-10-CM

## 2015-04-19 DIAGNOSIS — C140 Malignant neoplasm of pharynx, unspecified: Secondary | ICD-10-CM | POA: Diagnosis present

## 2015-04-19 DIAGNOSIS — L97509 Non-pressure chronic ulcer of other part of unspecified foot with unspecified severity: Secondary | ICD-10-CM

## 2015-04-19 DIAGNOSIS — C760 Malignant neoplasm of head, face and neck: Secondary | ICD-10-CM

## 2015-04-19 DIAGNOSIS — N189 Chronic kidney disease, unspecified: Secondary | ICD-10-CM | POA: Diagnosis present

## 2015-04-19 HISTORY — DX: Malignant neoplasm of head, face and neck: C76.0

## 2015-04-19 LAB — CBC WITH DIFFERENTIAL/PLATELET
Basophils Absolute: 0.1 10*3/uL (ref 0.0–0.1)
Basophils Relative: 1 %
EOS ABS: 0.3 10*3/uL (ref 0.0–0.7)
Eosinophils Relative: 2 %
HEMATOCRIT: 43.5 % (ref 36.0–46.0)
HEMOGLOBIN: 14.5 g/dL (ref 12.0–15.0)
LYMPHS ABS: 3.3 10*3/uL (ref 0.7–4.0)
LYMPHS PCT: 25 %
MCH: 28.3 pg (ref 26.0–34.0)
MCHC: 33.3 g/dL (ref 30.0–36.0)
MCV: 84.8 fL (ref 78.0–100.0)
MONOS PCT: 4 %
Monocytes Absolute: 0.6 10*3/uL (ref 0.1–1.0)
NEUTROS ABS: 9.1 10*3/uL — AB (ref 1.7–7.7)
NEUTROS PCT: 68 %
Platelets: 310 10*3/uL (ref 150–400)
RBC: 5.13 MIL/uL — ABNORMAL HIGH (ref 3.87–5.11)
RDW: 13.3 % (ref 11.5–15.5)
WBC: 13.4 10*3/uL — ABNORMAL HIGH (ref 4.0–10.5)

## 2015-04-19 LAB — COMPREHENSIVE METABOLIC PANEL
ALK PHOS: 94 U/L (ref 38–126)
ALT: 17 U/L (ref 14–54)
ANION GAP: 12 (ref 5–15)
AST: 18 U/L (ref 15–41)
Albumin: 3.4 g/dL — ABNORMAL LOW (ref 3.5–5.0)
BILIRUBIN TOTAL: 0.1 mg/dL — AB (ref 0.3–1.2)
BUN: 22 mg/dL — ABNORMAL HIGH (ref 6–20)
CALCIUM: 9.4 mg/dL (ref 8.9–10.3)
CO2: 25 mmol/L (ref 22–32)
CREATININE: 1.17 mg/dL — AB (ref 0.44–1.00)
Chloride: 105 mmol/L (ref 101–111)
GFR, EST AFRICAN AMERICAN: 59 mL/min — AB (ref >60)
GFR, EST NON AFRICAN AMERICAN: 51 mL/min — AB (ref >60)
Glucose, Bld: 119 mg/dL — ABNORMAL HIGH (ref 65–99)
Potassium: 4 mmol/L (ref 3.5–5.1)
Sodium: 142 mmol/L (ref 135–145)
TOTAL PROTEIN: 7.1 g/dL (ref 6.5–8.1)

## 2015-04-19 LAB — SEDIMENTATION RATE: SED RATE: 41 mm/h — AB (ref 0–22)

## 2015-04-19 LAB — GLUCOSE, CAPILLARY: GLUCOSE-CAPILLARY: 139 mg/dL — AB (ref 65–99)

## 2015-04-19 MED ORDER — VANCOMYCIN HCL 10 G IV SOLR
2000.0000 mg | Freq: Once | INTRAVENOUS | Status: AC
  Administered 2015-04-19: 2000 mg via INTRAVENOUS
  Filled 2015-04-19: qty 2000

## 2015-04-19 MED ORDER — VANCOMYCIN HCL IN DEXTROSE 1-5 GM/200ML-% IV SOLN: 1000.0000 mg | Freq: Once | INTRAVENOUS | Status: DC

## 2015-04-19 MED ORDER — ACETAMINOPHEN 650 MG RE SUPP: 650.0000 mg | Freq: Four times a day (QID) | RECTAL | Status: DC | PRN

## 2015-04-19 MED ORDER — PREGABALIN 75 MG PO CAPS
75.0000 mg | ORAL_CAPSULE | Freq: Four times a day (QID) | ORAL | Status: DC
  Administered 2015-04-19 – 2015-04-20 (×4): 75 mg via ORAL
  Filled 2015-04-19 (×4): qty 1

## 2015-04-19 MED ORDER — IPRATROPIUM BROMIDE 0.02 % IN SOLN
0.5000 mg | Freq: Four times a day (QID) | RESPIRATORY_TRACT | Status: DC
  Administered 2015-04-20: 0.5 mg via RESPIRATORY_TRACT
  Filled 2015-04-19: qty 2.5

## 2015-04-19 MED ORDER — ONDANSETRON HCL 4 MG/2ML IJ SOLN: 4.0000 mg | Freq: Four times a day (QID) | INTRAMUSCULAR | Status: DC | PRN

## 2015-04-19 MED ORDER — PIPERACILLIN-TAZOBACTAM 3.375 G IVPB
3.3750 g | Freq: Three times a day (TID) | INTRAVENOUS | Status: DC
Start: 2015-04-20 — End: 2015-04-20
  Administered 2015-04-20 (×2): 3.375 g via INTRAVENOUS
  Filled 2015-04-19 (×4): qty 50

## 2015-04-19 MED ORDER — PIPERACILLIN-TAZOBACTAM 3.375 G IVPB 30 MIN
3.3750 g | Freq: Once | INTRAVENOUS | Status: AC
  Administered 2015-04-19: 3.375 g via INTRAVENOUS
  Filled 2015-04-19: qty 50

## 2015-04-19 MED ORDER — DOCUSATE SODIUM 100 MG PO CAPS: 100.0000 mg | ORAL_CAPSULE | Freq: Every day | ORAL | Status: DC | PRN

## 2015-04-19 MED ORDER — SODIUM CHLORIDE 0.9 % IV BOLUS (SEPSIS)
1000.0000 mL | Freq: Once | INTRAVENOUS | Status: AC
  Administered 2015-04-19: 1000 mL via INTRAVENOUS

## 2015-04-19 MED ORDER — BISACODYL 10 MG RE SUPP: 10.0000 mg | Freq: Every day | RECTAL | Status: DC | PRN

## 2015-04-19 MED ORDER — AMLODIPINE BESYLATE 10 MG PO TABS
10.0000 mg | ORAL_TABLET | Freq: Every day | ORAL | Status: DC
  Administered 2015-04-20: 10 mg via ORAL
  Filled 2015-04-19: qty 1

## 2015-04-19 MED ORDER — ACETAMINOPHEN 325 MG PO TABS: 650.0000 mg | ORAL_TABLET | Freq: Four times a day (QID) | ORAL | Status: DC | PRN

## 2015-04-19 MED ORDER — VANCOMYCIN HCL IN DEXTROSE 750-5 MG/150ML-% IV SOLN
750.0000 mg | Freq: Two times a day (BID) | INTRAVENOUS | Status: DC
Start: 2015-04-20 — End: 2015-04-20
  Administered 2015-04-20: 750 mg via INTRAVENOUS
  Filled 2015-04-19 (×4): qty 150

## 2015-04-19 MED ORDER — INSULIN GLARGINE 100 UNIT/ML ~~LOC~~ SOLN
34.0000 [IU] | Freq: Every day | SUBCUTANEOUS | Status: DC
  Administered 2015-04-20: 34 [IU] via SUBCUTANEOUS
  Filled 2015-04-19 (×2): qty 0.34

## 2015-04-19 MED ORDER — CYCLOBENZAPRINE HCL 10 MG PO TABS
10.0000 mg | ORAL_TABLET | Freq: Every evening | ORAL | Status: DC | PRN
  Administered 2015-04-19: 10 mg via ORAL
  Filled 2015-04-19: qty 1

## 2015-04-19 MED ORDER — INSULIN ASPART 100 UNIT/ML ~~LOC~~ SOLN
0.0000 [IU] | Freq: Three times a day (TID) | SUBCUTANEOUS | Status: DC
  Administered 2015-04-20: 3 [IU] via SUBCUTANEOUS

## 2015-04-19 MED ORDER — INSULIN ASPART 100 UNIT/ML ~~LOC~~ SOLN: 0.0000 [IU] | Freq: Every day | SUBCUTANEOUS | Status: DC

## 2015-04-19 MED ORDER — ALBUTEROL SULFATE HFA 108 (90 BASE) MCG/ACT IN AERS: 2.0000 | INHALATION_SPRAY | RESPIRATORY_TRACT | Status: DC | PRN

## 2015-04-19 MED ORDER — POLYETHYLENE GLYCOL 3350 17 G PO PACK: 17.0000 g | PACK | Freq: Every day | ORAL | Status: DC | PRN

## 2015-04-19 MED ORDER — IBUPROFEN 200 MG PO TABS
600.0000 mg | ORAL_TABLET | Freq: Once | ORAL | Status: AC
  Administered 2015-04-19: 600 mg via ORAL
  Filled 2015-04-19: qty 3

## 2015-04-19 MED ORDER — ONDANSETRON HCL 4 MG PO TABS: 4.0000 mg | ORAL_TABLET | Freq: Four times a day (QID) | ORAL | Status: DC | PRN

## 2015-04-19 MED ORDER — INSULIN GLARGINE 100 UNIT/ML SOLOSTAR PEN: 34.0000 [IU] | PEN_INJECTOR | Freq: Every day | SUBCUTANEOUS | Status: DC

## 2015-04-19 MED ORDER — BUPRENORPHINE HCL 8 MG SL SUBL
8.0000 mg | SUBLINGUAL_TABLET | Freq: Three times a day (TID) | SUBLINGUAL | Status: DC
  Administered 2015-04-19 – 2015-04-20 (×3): 8 mg via SUBLINGUAL
  Filled 2015-04-19 (×3): qty 1

## 2015-04-19 MED ORDER — ALBUTEROL SULFATE (2.5 MG/3ML) 0.083% IN NEBU: 2.5000 mg | INHALATION_SOLUTION | RESPIRATORY_TRACT | Status: DC | PRN

## 2015-04-19 MED ORDER — ENOXAPARIN SODIUM 40 MG/0.4ML ~~LOC~~ SOLN
40.0000 mg | Freq: Every day | SUBCUTANEOUS | Status: DC
  Administered 2015-04-19: 40 mg via SUBCUTANEOUS
  Filled 2015-04-19: qty 0.4

## 2015-04-19 NOTE — ED Notes (Signed)
Hospitalist at bedside 

## 2015-04-19 NOTE — ED Provider Notes (Signed)
CSN: [id number]     Arrival date & time 04/19/15  1426 History   First MD Initiated Contact with Patient 04/19/15 1745     Chief Complaint  Patient presents with  . Wound Infection     (Consider location/radiation/quality/duration/timing/severity/associated sxs/prior Treatment) HPI Comments: 60yo F w/ extensive PMH including HTN, COPD, IDDM, CKD, peripheral neuropathy who p/w L foot ulcers. Patient noticed 2 ulcers on the bottom of her left foot at the end of January. She is not sure how long they were there and she denies trauma. She was seen by her PCP who was working on arranging for wound care. A few days ago, she began having malodorous drainage from the wounds, she saw her PCP again today and they sent her here for further evaluation due to concerns for osteomyelitis. He endorses subjective fevers and chills but denies any vomiting or diarrhea. Blood sugars have been running slightly higher in the 150s. She denies any significant pain as she has neuropathy in bilateral feet.  The history is provided by the patient.    Past Medical History  Diagnosis Date  . Hypertension   . COPD (chronic obstructive pulmonary disease) ([place])   . Shortness of breath   . Arthritis   . Diabetic neuropathy ([place])   . Rash, skin     face  . Kidney stone   . Sleep apnea     has not used cpap past 10 yrs  . Nocturia   . Urge incontinence   . Morbid obesity with BMI of 40.0-44.9, adult ([place])   . Renal insufficiency   . Diabetes mellitus without complication ([place])     INSULIN DEPENDENT  . Neuropathy [facility])    Past Surgical History  Procedure Laterality Date  . Cholecystectomy    . Tubal ligation    . Carpel tunnel    . Cystoscopy w/ ureteral stent placement Left 03/22/2014    Procedure: CYSTOSCOPY WITH RETROGRADE PYELOGRAM/URETERAL STENT PLACEMENT;  Surgeon: Alexis Frock, MD;  Location: [facility];  Service: Urology;  Laterality: Left;  . Breast surgery  1982    LUMPECTOMY / benign  . Salivary stone  removal    . Cystoscopy with retrograde pyelogram, ureteroscopy and stent placement Left 05/11/2014    Procedure: [place], URETEROSCOPY AND STENT PLACEMENT;  Surgeon: Alexis Frock, MD;  [facility];  Service: Urology;  Laterality: Left;  . Holmium laser application Left 08/31/8673    Procedure: HOLMIUM LASER APPLICATION;  Surgeon: Alexis Frock, MD;  [facility];  Service: Urology;  Laterality: Left;   Family History  Problem Relation Age of Onset  . Adopted: Yes   Social History  Substance Use Topics  . Smoking status: Current Every Day Smoker -- 1.00 packs/day for 40 years    Types: Cigarettes  . Smokeless tobacco: Never Used  . Alcohol Use: No   OB History    No data available     Review of Systems 10 Systems reviewed and are negative for acute change except as noted in the HPI.    Allergies  Ace inhibitors and Zestril  Home Medications   Prior to Admission medications   Medication Sig Start Date End Date Taking? Authorizing Provider  albuterol (PROVENTIL HFA;VENTOLIN HFA) 108 (90 BASE) MCG/ACT inhaler Inhale 2 puffs into the lungs every 4 (four) hours as needed for wheezing or shortness of breath. 08/10/12  Yes Hosie Poisson, MD  amLODipine (NORVASC) 10 MG tablet Take 1 tablet (10 mg total) by mouth  daily. 03/31/15  Yes Everlene Balls, MD  Buprenorphine HCl-Naloxone HCl (SUBOXONE) 8-2 MG FILM Place 1 Film under the tongue 2 (two) times daily.   Yes Historical Provider, MD  cyclobenzaprine (FLEXERIL) 10 MG tablet Take 10 mg by mouth at bedtime as needed for muscle spasms.   Yes Historical Provider, MD  docusate sodium (COLACE) 100 MG capsule Take 100 mg by mouth daily as needed for mild constipation.   Yes Historical Provider, MD  ibuprofen (ADVIL,MOTRIN) 200 MG tablet Take 400-600 mg by mouth every 6 (six) hours as needed for fever, headache or moderate pain.    Yes Historical Provider, MD  insulin aspart (NOVOLOG FLEXPEN) 100 UNIT/ML  FlexPen Inject 3 Units into the skin 3 (three) times daily with meals.   Yes Historical Provider, MD  Insulin Glargine (LANTUS SOLOSTAR) 100 UNIT/ML Solostar Pen Inject 34 Units into the skin daily. At [place] 03/24/14  Yes Bonnielee Haff, MD  Multiple Vitamin (MULTIVITAMIN) tablet Take 1 tablet by mouth daily.   Yes Historical Provider, MD  pregabalin (LYRICA) 75 MG capsule Take 75 mg by mouth 4 (four) times daily.   Yes Historical Provider, MD  albuterol (PROVENTIL HFA;VENTOLIN HFA) 108 (90 Base) MCG/ACT inhaler Inhale 1-2 puffs into the lungs every 6 (six) hours as needed for wheezing or shortness of breath. Patient not taking: Reported on 04/19/2015 03/02/15   Olivia Canter Sam, PA-C  blood glucose meter kit and supplies KIT Dispense based on patient and insurance preference. Use up to four times daily as directed. (FOR ICD-9 250.00, 250.01). 03/24/14   Bonnielee Haff, MD  insulin aspart (NOVOLOG FLEXPEN) 100 UNIT/ML FlexPen Give yourself 3 units before each meal. Patient not taking: Reported on 04/19/2015 03/24/14   Bonnielee Haff, MD  Insulin Pen Needle (PEN NEEDLES 3/16") 31G X 5 MM MISC Use as directed. 03/24/14   Bonnielee Haff, MD  predniSONE (DELTASONE) 20 MG tablet Take 2 tablets (40 mg total) by mouth daily. Patient not taking: Reported on 04/19/2015 03/02/15   Olivia Canter Sam, PA-C   BP 139/78 mmHg  Pulse 90  Temp(Src) 98.1 F (36.7 C) (Oral)  Resp 20  Ht 5' 6"  (1.676 m)  Wt 220 lb (99.791 kg)  BMI 35.53 kg/m2  SpO2 97% Physical Exam  Constitutional: She is oriented to person, place, and time. She appears well-developed and well-nourished. No distress.  HENT:  Head: Normocephalic and atraumatic.  Moist mucous membranes  Eyes: Conjunctivae are normal. Pupils are equal, round, and reactive to light.  Neck: Neck supple.  Cardiovascular: Normal rate, regular rhythm, normal heart sounds and intact distal pulses.   No murmur heard. Pulmonary/Chest: Effort normal.  Diffuse expiratory wheezes b/l    Abdominal: Soft. Bowel sounds are normal. She exhibits no distension. There is no tenderness.  Musculoskeletal: She exhibits edema.  Mild edema b/l LE  Neurological: She is alert and oriented to person, place, and time.  Fluent speech  Skin: Skin is warm and dry.  2 large, deep, malodorous ulcers on plantar surface of L foot--see photo  Psychiatric: She has a normal mood and affect. Judgment normal.  Nursing note and vitals reviewed.     ED Course  Procedures (including critical care time) Labs Review Labs Reviewed  CBC WITH DIFFERENTIAL/PLATELET - Abnormal; Notable for the following:    WBC 13.4 (*)    RBC 5.13 (*)    Neutro Abs 9.1 (*)    All other components within normal limits  COMPREHENSIVE METABOLIC PANEL - Abnormal; Notable for the  following:    Glucose, Bld 119 (*)    BUN 22 (*)    Creatinine, Ser 1.17 (*)    Albumin 3.4 (*)    Total Bilirubin 0.1 (*)    GFR calc non Af Amer 51 (*)    GFR calc Af Amer 59 (*)    All other components within normal limits    Imaging Review Dg Foot Complete Left  04/19/2015  CLINICAL DATA:  Open wound on the plantar surface of the foot at the first MTP joint. No known injury. Initial encounter. EXAM: LEFT FOOT - COMPLETE 3+ VIEW COMPARISON:  Plain films left foot 03/04/2007. FINDINGS: Wounds are seen on the plantar surface of the foot at the first MTP joint and along the lateral aspect of the foot at the level of the base of the fifth metatarsal. No radiopaque foreign body is identified. No bony destructive change or periosteal reaction is noted. Remote healed fourth and fifth metatarsal fractures are noted. IMPRESSION: Skin ulcerations on the foot without evidence of osteomyelitis. Remote healed fourth and fifth metatarsal fractures. Electronically Signed   By: Inge Rise M.D.   On: 04/19/2015 15:06   I have personally reviewed and evaluated these lab results as part of my medical decision-making.   EKG Interpretation None      Medications  piperacillin-tazobactam (ZOSYN) IVPB 3.375 g (3.375 g Intravenous New Bag/Given 04/19/15 1836)  vancomycin (VANCOCIN) 2,000 mg in sodium chloride 0.9 % 500 mL IVPB (not administered)  vancomycin (VANCOCIN) IVPB 750 mg/150 ml premix (not administered)  sodium chloride 0.9 % bolus 1,000 mL (1,000 mLs Intravenous New Bag/Given 04/19/15 1836)    MDM   Final diagnoses:  Diabetic infection of left foot ([place])   Patient with IDDM presents with several weeks of left foot ulcers that have recently begun draining malodorous fluid. On exam, she was awake, alert, comfortable and in no acute distress. Vital signs unremarkable. She had 2 large ulcers that were malodorous and concerning for infection on her left plantar foot. Plain films showed no evidence of osteomyelitis. Lab work shows leukocytosis w/ WBC 13.4, creatinine stable at 1.17. Gave the pt IVF, vanc, and zosyn for broad spectrum coverage. Because of patient's underlying diabetes and leukocytosis, I feel she requires IV antibiotics and have recommended admission. Pt in agreement. Discussed w/ [facility] hospitalist, Dr. Roel Cluck, and pt admitted for further care.    Sharlett Iles, MD 04/19/15 1900

## 2015-04-19 NOTE — Progress Notes (Signed)
04/19/15  Pharmacy- [facility]   Cr 1.17  A/P:  First dose of Zosyn given in ED, now to continue with Vancomycin '750mg'$  IV q12 for cellulitis.  No adjustment for renal fxn.     1-  Zosyn 3.375g IV q8, infusing over 4hr 2-  F/U blood cx  3-  Trend clinical status 4-  Vanc Tr when appropriate  Gracy Bruins, PharmD [facility]

## 2015-04-19 NOTE — ED Notes (Signed)
Pt sent by PCP for eval of 2 ulcers to pt left posterior foot, unknown of how long its been there due to numbness from diabetic neuropathy. Pt sent for xray to r/ osteomyelitis and also r/o infection .

## 2015-04-19 NOTE — Consult Note (Signed)
Pharmacy Antibiotic Note  [name] is a 60 y.o. female admitted on 04/19/2015 with left foot ulcers. Xray negative for osteomyelitis.  Pharmacy has been consulted to dose vancomycin for cellulitis. Renal function wnl.  Goal: Vancomycin trough 10-15  Plan: 1) Vancomycin 2g IV x 1 then '750mg'$  IV q12 2) Follow renal function, cultures, LOT, level if needed  Height: '5\' 6"'$  (167.6 cm) Weight: 220 lb (99.791 kg) IBW/kg (Calculated) : 59.3  Temp (24hrs), Avg:98 F (36.7 C), Min:97.8 F (36.6 C), Max:98.1 F (36.7 C)   Recent Labs Lab 04/19/15 1456  WBC 13.4*  CREATININE 1.17*    Estimated Creatinine Clearance: 63.2 mL/min (by C-G formula based on Cr of 1.17).    Allergies  Allergen Reactions  . Ace Inhibitors Anaphylaxis  . Zestril [Lisinopril] Anaphylaxis and Swelling    Antimicrobials this admission: 2/22 Vanc >> 2/22 Zosyn x 1 in ED  Dose adjustments this admission: n/a  Microbiology results: n/a  Thank you for allowing pharmacy to be a part of this patient's care.  Deboraha Sprang 04/19/2015 6:22 PM

## 2015-04-19 NOTE — H&P (Signed)
PCP:  Kerin Perna, NP    Referring provider [place]   Chief Complaint:  Foul smelling ulcer  HPI: [name] is a 60 y.o. female   has a past medical history of Hypertension; COPD (chronic obstructive pulmonary disease) ([place]); Shortness of breath; Arthritis; Diabetic neuropathy ([place]); Rash, skin; Kidney stone; Sleep apnea; Nocturia; Urge incontinence; Morbid obesity with BMI of 40.0-44.9, adult ([place]); Renal insufficiency; Diabetes mellitus without complication ([place]); and Neuropathy ([place]).   Presented with left foot ulcers unclear duration and now with foul discharge for 2 days, in the setting of poorly controlled DM and diabetic neuropathy. Was sent from PCP office for evaluation for osteomyelitis to ER. Denies fever had some chills. She thinks the ulcer has been there at least since January she had prior fracture of her left foot resulting in deformity reports chronic right hip pain.   IN ER: plain imaging showing no osteomyelitis   Regarding pertinent past history: Dm and HTN poorly controlled due to medication noncompliance.Patient has hx of mass in her she was supposed to have a biopsy done at [facility].  She had recent coiling of brainstem aneurism done at [facility] 1 week ago.   Hospitalist was called for admission for diabetic foot ulcer  Review of Systems:    Pertinent positives include:  Chills, foul discharge from the foot  Constitutional:  No weight loss, night sweats, Fevers, fatigue, weight loss  HEENT:  No headaches, Difficulty swallowing,Tooth/dental problems,Sore throat,  No sneezing, itching, ear ache, nasal congestion, post nasal drip,  Cardio-vascular:  No chest pain, Orthopnea, PND, anasarca, dizziness, palpitations.no Bilateral lower extremity swelling  GI:  No heartburn, indigestion, abdominal pain, nausea, vomiting, diarrhea, change in bowel habits, loss of appetite, melena, blood in stool, hematemesis Resp:  no shortness of breath at rest. No  dyspnea on exertion, No excess mucus, no productive cough, No non-productive cough, No coughing up of blood.No change in color of mucus.No wheezing. Skin:  no rash or lesions. No jaundice GU:  no dysuria, change in color of urine, no urgency or frequency. No straining to urinate.  No flank pain.  Musculoskeletal:  No joint pain or no joint swelling. No decreased range of motion. No back pain.  Psych:  No change in mood or affect. No depression or anxiety. No memory loss.  Neuro: no localizing neurological complaints, no tingling, no weakness, no double vision, no gait abnormality, no slurred speech, no confusion  Otherwise ROS are negative except for above, 10 systems were reviewed  Past Medical History: Past Medical History  Diagnosis Date  . Hypertension   . COPD (chronic obstructive pulmonary disease) ([place])   . Shortness of breath   . Arthritis   . Diabetic neuropathy ([place])   . Rash, skin     face  . Kidney stone   . Sleep apnea     has not used cpap past 10 yrs  . Nocturia   . Urge incontinence   . Morbid obesity with BMI of 40.0-44.9, adult ([place])   . Renal insufficiency   . 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 Neuropathy [facility])    Past Surgical History  Procedure Laterality Date  . Cholecystectomy    . Tubal ligation    . Carpel tunnel    . Cystoscopy w/ ureteral stent placement Left 03/22/2014    Procedure: CYSTOSCOPY WITH RETROGRADE PYELOGRAM/URETERAL STENT PLACEMENT;  Surgeon: Alexis Frock, MD;  Location: [facility];  Service: Urology;  Laterality: Left;  . Breast surgery  1982    LUMPECTOMY / benign  . Salivary stone removal    . Cystoscopy with retrograde pyelogram, ureteroscopy and stent placement Left 05/11/2014    Procedure: [place], URETEROSCOPY AND STENT PLACEMENT;  Surgeon: Alexis Frock, MD;  [facility];  Service: Urology;  Laterality: Left;  . Holmium laser application Left 8/46/6599     Procedure: HOLMIUM LASER APPLICATION;  Surgeon: Alexis Frock, MD;  [facility];  Service: Urology;  Laterality: Left;     Medications: Prior to Admission medications   Medication Sig Start Date End Date Taking? Authorizing Provider  albuterol (PROVENTIL HFA;VENTOLIN HFA) 108 (90 BASE) MCG/ACT inhaler Inhale 2 puffs into the lungs every 4 (four) hours as needed for wheezing or shortness of breath. 08/10/12  Yes Hosie Poisson, MD  amLODipine (NORVASC) 10 MG tablet Take 1 tablet (10 mg total) by mouth daily. 03/31/15  Yes Everlene Balls, MD  Buprenorphine HCl-Naloxone HCl (SUBOXONE) 8-2 MG FILM Place 1 Film under the tongue 2 (two) times daily.   Yes Historical Provider, MD  cyclobenzaprine (FLEXERIL) 10 MG tablet Take 10 mg by mouth at bedtime as needed for muscle spasms.   Yes Historical Provider, MD  docusate sodium (COLACE) 100 MG capsule Take 100 mg by mouth daily as needed for mild constipation.   Yes Historical Provider, MD  ibuprofen (ADVIL,MOTRIN) 200 MG tablet Take 400-600 mg by mouth every 6 (six) hours as needed for fever, headache or moderate pain.    Yes Historical Provider, MD  insulin aspart (NOVOLOG FLEXPEN) 100 UNIT/ML FlexPen Inject 3 Units into the skin 3 (three) times daily with meals.   Yes Historical Provider, MD  Insulin Glargine (LANTUS SOLOSTAR) 100 UNIT/ML Solostar Pen Inject 34 Units into the skin daily. At [place] 03/24/14  Yes Bonnielee Haff, MD  Multiple Vitamin (MULTIVITAMIN) tablet Take 1 tablet by mouth daily.   Yes Historical Provider, MD  pregabalin (LYRICA) 75 MG capsule Take 75 mg by mouth 4 (four) times daily.   Yes Historical Provider, MD  albuterol (PROVENTIL HFA;VENTOLIN HFA) 108 (90 Base) MCG/ACT inhaler Inhale 1-2 puffs into the lungs every 6 (six) hours as needed for wheezing or shortness of breath. Patient not taking: Reported on 04/19/2015 03/02/15   Olivia Canter Sam, PA-C  blood glucose meter kit and supplies KIT Dispense based on patient and insurance preference.  Use up to four times daily as directed. (FOR ICD-9 250.00, 250.01). 03/24/14   Bonnielee Haff, MD  insulin aspart (NOVOLOG FLEXPEN) 100 UNIT/ML FlexPen Give yourself 3 units before each meal. Patient not taking: Reported on 04/19/2015 03/24/14   Bonnielee Haff, MD  Insulin Pen Needle (PEN NEEDLES 3/16") 31G X 5 MM MISC Use as directed. 03/24/14   Bonnielee Haff, MD  predniSONE (DELTASONE) 20 MG tablet Take 2 tablets (40 mg total) by mouth daily. Patient not taking: Reported on 04/19/2015 03/02/15   Anne Ng, PA-C    Allergies:   Allergies  Allergen Reactions  . Ace Inhibitors Anaphylaxis  . Zestril [Lisinopril] Anaphylaxis and Swelling    Social History:  Ambulatory   Cane  Lives at home With family     reports that she has been smoking Cigarettes.  She has a 40 pack-year smoking history. She has never used smokeless tobacco. She reports that she does not drink alcohol or use illicit drugs.     Family History: family history is not on file. She was adopted.    Physical  Exam: Patient Vitals for the past 24 hrs:  BP Temp Temp src Pulse Resp SpO2 Height Weight  04/19/15 1704 139/78 mmHg 98.1 F (36.7 C) Oral 90 20 97 % - -  04/19/15 1435 146/100 mmHg 97.8 F (36.6 C) Oral 101 16 95 % 5' 6"  (1.676 m) 99.791 kg (220 lb)    1. General:  in No Acute distress 2. Psychological: Alert and   Oriented 3. Head/ENT:    Dry Mucous Membranes                          Head Non traumatic, neck supple                          Normal   Dentition 4. SKIN:   decreased Skin turgor,  Skin clean Dry discoloration of peri tibial area, foot ulceration on the left            5. Heart: Regular rate and rhythm no  Murmur, Rub or gallop 6. Lungs: Clear to auscultation bilaterally, no wheezes or crackles   7. Abdomen: Soft, non-tender, Non distended 8. Lower extremities: no clubbing, cyanosis, or edema 9. Neurologically Grossly intact, moving all 4 extremities equally 10. MSK: Normal range of  motion  body mass index is 35.53 kg/(m^2).   Labs on Admission:   Results for orders placed or performed during the hospital encounter of 04/19/15 (from the past 24 hour(s))  CBC with Differential     Status: Abnormal   Collection Time: 04/19/15  2:56 PM  Result Value Ref Range   WBC 13.4 (H) 4.0 - 10.5 K/uL   RBC 5.13 (H) 3.87 - 5.11 MIL/uL   Hemoglobin 14.5 12.0 - 15.0 g/dL   HCT 43.5 36.0 - 46.0 %   MCV 84.8 78.0 - 100.0 fL   MCH 28.3 26.0 - 34.0 pg   MCHC 33.3 30.0 - 36.0 g/dL   RDW 13.3 11.5 - 15.5 %   Platelets 310 150 - 400 K/uL   Neutrophils Relative % 68 %   Neutro Abs 9.1 (H) 1.7 - 7.7 K/uL   Lymphocytes Relative 25 %   Lymphs Abs 3.3 0.7 - 4.0 K/uL   Monocytes Relative 4 %   Monocytes Absolute 0.6 0.1 - 1.0 K/uL   Eosinophils Relative 2 %   Eosinophils Absolute 0.3 0.0 - 0.7 K/uL   Basophils Relative 1 %   Basophils Absolute 0.1 0.0 - 0.1 K/uL  Comprehensive metabolic panel     Status: Abnormal   Collection Time: 04/19/15  2:56 PM  Result Value Ref Range   Sodium 142 135 - 145 mmol/L   Potassium 4.0 3.5 - 5.1 mmol/L   Chloride 105 101 - 111 mmol/L   CO2 25 22 - 32 mmol/L   Glucose, Bld 119 (H) 65 - 99 mg/dL   BUN 22 (H) 6 - 20 mg/dL   Creatinine, Ser 1.17 (H) 0.44 - 1.00 mg/dL   Calcium 9.4 8.9 - 10.3 mg/dL   Total Protein 7.1 6.5 - 8.1 g/dL   Albumin 3.4 (L) 3.5 - 5.0 g/dL   AST 18 15 - 41 U/L   ALT 17 14 - 54 U/L   Alkaline Phosphatase 94 38 - 126 U/L   Total Bilirubin 0.1 (L) 0.3 - 1.2 mg/dL   GFR calc non Af Amer 51 (L) >60 mL/min   GFR calc Af Amer 59 (L) >60 mL/min   Anion  gap 12 5 - 15    UA not obtained  Lab Results  Component Value Date   HGBA1C 11.1* 03/22/2014    Estimated Creatinine Clearance: 63.2 mL/min (by C-G formula based on Cr of 1.17).  BNP (last 3 results) No results for input(s): PROBNP in the last 8760 hours.  Other results:  I have pearsonaly reviewed this: ECG REPORT Not obtained  Filed Weights   04/19/15 1435    Weight: 99.791 kg (220 lb)     Cultures:    Component Value Date/Time   SDES URINE, CATHETERIZED 03/22/2014 1704   SDES URINE, CATHETERIZED 03/22/2014 1704   SPECREQUEST LEFT KIDNEY 03/22/2014 1704   SPECREQUEST LEFT KIDNEY 03/22/2014 1704   CULT  03/22/2014 1704    CITROBACTER KOSERI Performed at [facility] 03/25/2014 FINAL 03/22/2014 1704   REPTSTATUS 03/22/2014 FINAL 03/22/2014 1704     Radiological Exams on Admission: Dg Foot Complete Left  04/19/2015  CLINICAL DATA:  Open wound on the plantar surface of the foot at the first MTP joint. No known injury. Initial encounter. EXAM: LEFT FOOT - COMPLETE 3+ VIEW COMPARISON:  Plain films left foot 03/04/2007. FINDINGS: Wounds are seen on the plantar surface of the foot at the first MTP joint and along the lateral aspect of the foot at the level of the base of the fifth metatarsal. No radiopaque foreign body is identified. No bony destructive change or periosteal reaction is noted. Remote healed fourth and fifth metatarsal fractures are noted. IMPRESSION: Skin ulcerations on the foot without evidence of osteomyelitis. Remote healed fourth and fifth metatarsal fractures. Electronically Signed   By: Inge Rise M.D.   On: 04/19/2015 15:06    Chart has been reviewed  Family not  at  Bedside    Assessment/Plan  60 year old female history of diabetes poorly controlled, hypertension poorly controlled recent brainstem aneurysm status post coiling and recently diagnosed head and neck mass which she is followed [facility] by [facility] presents with diabetic foot ulcer with follow discharge and chills no evidence of osteomyelitis by plain imaging orthopedics has been consulted will see patient in AM  Present on Admission:  . Diabetic foot infection ([place]) - admit for IV antibiotics for now vancomycin and Zosyn, orthopedics to see in the morning.  Does not appear to require emergent operative intervention will defer to  orthopedics  . Tobacco abuse - consult regarding quitting tobacco cessation education  . Obstructive sleep apnea patient states she is not using C Pap  . Essential hypertension - continue Norvasc  . COPD (chronic obstructive pulmonary disease) ([facility]) - albuterol as needed scheduled Atrovent  . Controlled type 2 diabetes mellitus with diabetic nephropathy ([place]) - continue Lantus dose at 34 units and sliding scale  . Chronic pain syndrome - continue home medications discussed with pharmacy    . CKD (chronic kidney disease) stage 3, GFR 30-59 ml/min -currently at her baseline  . Head and neck cancer [facility]) -need to follow [facility] at [facility] after discharge  . Right hip pain - most likely musculoskeletal exam plain imaging   Prophylaxis: Lovenox   CODE STATUS:  FULL CODE  as per patient   Disposition:  To home once workup is complete and patient is stable  Other plan as per orders.  I have spent a total of 68mn on this admission  extra time was spent to discuss case With Dr EPatricia Pesawith orthopedics and pharmacy  [facility]2/22/2017, 8:05 PM    [facility]  Pager [phone]   after 2 AM please page floor coverage PA If 7AM-7PM, please contact the day team taking care of the patient  [email or web address]  Password [email or web address]

## 2015-04-20 ENCOUNTER — Inpatient Hospital Stay (HOSPITAL_COMMUNITY): Payer: Medicaid Other

## 2015-04-20 DIAGNOSIS — L97529 Non-pressure chronic ulcer of other part of left foot with unspecified severity: Secondary | ICD-10-CM

## 2015-04-20 DIAGNOSIS — G4733 Obstructive sleep apnea (adult) (pediatric): Secondary | ICD-10-CM

## 2015-04-20 DIAGNOSIS — I10 Essential (primary) hypertension: Secondary | ICD-10-CM

## 2015-04-20 DIAGNOSIS — E11621 Type 2 diabetes mellitus with foot ulcer: Principal | ICD-10-CM

## 2015-04-20 DIAGNOSIS — L97409 Non-pressure chronic ulcer of unspecified heel and midfoot with unspecified severity: Secondary | ICD-10-CM

## 2015-04-20 DIAGNOSIS — Z720 Tobacco use: Secondary | ICD-10-CM

## 2015-04-20 DIAGNOSIS — G894 Chronic pain syndrome: Secondary | ICD-10-CM

## 2015-04-20 LAB — TSH: TSH: 2.15 u[IU]/mL (ref 0.350–4.500)

## 2015-04-20 LAB — CBC
HEMATOCRIT: 38.8 % (ref 36.0–46.0)
HEMOGLOBIN: 12.5 g/dL (ref 12.0–15.0)
MCH: 27.7 pg (ref 26.0–34.0)
MCHC: 32.2 g/dL (ref 30.0–36.0)
MCV: 85.8 fL (ref 78.0–100.0)
Platelets: 262 10*3/uL (ref 150–400)
RBC: 4.52 MIL/uL (ref 3.87–5.11)
RDW: 13.4 % (ref 11.5–15.5)
WBC: 8.7 10*3/uL (ref 4.0–10.5)

## 2015-04-20 LAB — COMPREHENSIVE METABOLIC PANEL
ALT: 13 U/L — AB (ref 14–54)
AST: 14 U/L — AB (ref 15–41)
Albumin: 2.8 g/dL — ABNORMAL LOW (ref 3.5–5.0)
Alkaline Phosphatase: 72 U/L (ref 38–126)
Anion gap: 9 (ref 5–15)
BILIRUBIN TOTAL: 0.4 mg/dL (ref 0.3–1.2)
BUN: 17 mg/dL (ref 6–20)
CALCIUM: 8.4 mg/dL — AB (ref 8.9–10.3)
CO2: 27 mmol/L (ref 22–32)
CREATININE: 1 mg/dL (ref 0.44–1.00)
Chloride: 107 mmol/L (ref 101–111)
GFR calc Af Amer: >60 mL/min (ref >60)
Glucose, Bld: 89 mg/dL (ref 65–99)
Potassium: 3.5 mmol/L (ref 3.5–5.1)
Sodium: 143 mmol/L (ref 135–145)
TOTAL PROTEIN: 6 g/dL — AB (ref 6.5–8.1)

## 2015-04-20 LAB — PHOSPHORUS: Phosphorus: 3.3 mg/dL (ref 2.5–4.6)

## 2015-04-20 LAB — PREALBUMIN: Prealbumin: 18.2 mg/dL (ref 18–38)

## 2015-04-20 LAB — GLUCOSE, CAPILLARY
GLUCOSE-CAPILLARY: 96 mg/dL (ref 65–99)
Glucose-Capillary: 176 mg/dL — ABNORMAL HIGH (ref 65–99)
Glucose-Capillary: 165 mg/dL — ABNORMAL HIGH (ref 65–99)

## 2015-04-20 LAB — C-REACTIVE PROTEIN: CRP: 8 mg/dL — AB (ref <1.0)

## 2015-04-20 LAB — MAGNESIUM: Magnesium: 2 mg/dL (ref 1.7–2.4)

## 2015-04-20 MED ORDER — COLLAGENASE 250 UNIT/GM EX OINT: TOPICAL_OINTMENT | Freq: Every day | CUTANEOUS | Status: DC

## 2015-04-20 MED ORDER — PRO-STAT SUGAR FREE PO LIQD
30.0000 mL | Freq: Two times a day (BID) | ORAL | Status: DC
  Administered 2015-04-20: 30 mL via ORAL
  Filled 2015-04-20: qty 30

## 2015-04-20 MED ORDER — COLLAGENASE 250 UNIT/GM EX OINT
TOPICAL_OINTMENT | Freq: Every day | CUTANEOUS | Status: DC
  Administered 2015-04-20: 09:00:00 via TOPICAL
  Filled 2015-04-20: qty 30

## 2015-04-20 NOTE — Care Management Note (Signed)
Case Management Note  Patient Details  Name: [name] MRN: [id number] Date of Birth: October 16, 1955  Subjective/Objective:  60 yr old female admitted with left foot diabetic ulcer.                 Action/Plan: Case manager discussed with patient need for home health RN for dressing changes and PT. Case manager contacted Rhett Bannister, [facility] Liaison with referral. Patient's daughter will assist her at discharge. Patient will follow up with Dr. Sharol Given in a week.   Expected Discharge Date:    04/20/15              Expected Discharge Plan:  [place]  In-House Referral:     Discharge planning Services  CM Consult  Post Acute Care Choice:  Durable Medical Equipment, Home Health Choice offered to:  Patient  DME Arranged:  Wheelchair manual DME Agency:  [place]:  RN, PT [facility] Agency:  [facility]  Status of Service:  Completed, signed off  Medicare Important Message Given:    Date Medicare IM Given:    Medicare IM give by:    Date Additional Medicare IM Given:    Additional Medicare Important Message give by:     If discussed at [place] of Stay Meetings, dates discussed:    Additional Comments:  Ninfa Meeker, RN 04/20/2015, 2:40 PM

## 2015-04-20 NOTE — Consult Note (Addendum)
WOC consult requested prior to ortho service involvement.  Dr Sharol Given has performed consult and provided plan of care.  Please refer to ortho team for questions. Please re-consult if further assistance is needed.  Thank-you,  Julien Girt MSN, [place], [place], [place], [place]

## 2015-04-20 NOTE — Progress Notes (Signed)
VASCULAR LAB PRELIMINARY  ARTERIAL  ABI completed: bilateral ABI within normal limits at rest.    RIGHT    LEFT    PRESSURE WAVEFORM  PRESSURE WAVEFORM  BRACHIAL 157 Tri BRACHIAL 144 Tri  DP   DP    AT 165 Tri AT 154 Tri  PT 181 Tri PT 176 Tri  PER   PER    GREAT TOE  NA GREAT TOE  NA    RIGHT LEFT  ABI 1.15 1.12     Landry Mellow, RDMS, RVT  04/20/2015, 1:00 PM

## 2015-04-20 NOTE — Progress Notes (Signed)
Pt discharge education and instructions completed with pt and children at bedside; all voices understanding and denies any questions. Pt IV removed; pt leg dsg remains clean dry and intact with no active drainage or bleeding noted. Pt to pick up electronically sent prescription from preferred pharmacy on file. Pt request information faxed to provided [facility] upon pt request. Pt home equipments including home wheelchair delivered to pt at bedside. Pt transported off unit via her personal wheelchair with belongings and children at side. Delia Heady RN

## 2015-04-20 NOTE — Progress Notes (Signed)
Utilization review completed. Lise Pincus, RN, BSN. 

## 2015-04-20 NOTE — Discharge Summary (Signed)
Physician Discharge Summary  [name]  FAO:[id number]  DOB: 1955-05-18  DOA: 04/19/2015  PCP: Kerin Perna, NP  Admit date: 04/19/2015 Discharge date: 04/20/2015  Time spent: Less than 30 minutes  Recommendations for Outpatient Follow-up:  1. Juluis Mire, NP/PCP in 1 week. Please follow final blood culture reports that were sent from the hospital. 2. Dr. Meridee Score, Orthopedics in 1 week 3. Nonweight bearing on left lower extremity 4. Home health RN for dressing changes and PT.  Discharge Diagnoses:  Active Problems:   Obstructive sleep apnea   Chronic pain syndrome   Essential hypertension   Tobacco abuse   COPD (chronic obstructive pulmonary disease) (HCC)   Controlled type 2 diabetes mellitus with diabetic nephropathy (HCC)   Diabetic foot infection (HCC)   Diabetic foot ulcer (HCC)   CKD (chronic kidney disease) stage 3, GFR 30-59 ml/min   Head and neck cancer ([place])   Right hip pain   Discharge Condition: Improved & Stable  Diet recommendation: Heart healthy and diabetic diet.  Filed Weights   04/19/15 1435  Weight: 99.791 kg (220 lb)    History of present illness & Hospital course:  60 year old female patient with history of HTN, COPD, DM 2/IDDM with peripheral neuropathy, OSA, obesity, presented to [facility] ED on 04/19/15 with history of chronic left foot ulcers and recent foul drainage off 2 days duration. She was sent from PCPs office for evaluation for osteomyelitis. She denied fever but had some chills. As per her report, she thinks that she has had these ulcers at least since January. She had prior fracture of left foot resulting in deformity and chronic hip pain. Patient has poorly controlled DM and hypertension apparently due to poor compliance. She has history of head and neck mass for which she is supposed to have biopsy at [facility] on 04/21/15. She had recent coiling of brainstem aneurysm done at [facility] a week ago. In the ED, plain  x-rays showed no osteomyelitis. She was admitted for observation and started empirically on IV vancomycin and Zosyn. Orthopedic/Dr. Sharol Given consulted and recommends Santyl dressing changes daily. I discussed with Dr. Sharol Given who does not see indication for antibiotics at this time. ABIs bilaterally are normal. He recommends nonweightbearing on left lower extremity. Physical therapy will be consulted but case management believes that her insurance may not cover home health PT. Home health RN requested for dressing changes. As per orthopedics, patient has diabetic insensate neuropathy with venous stasis insufficiency of bilateral lower extremities and venous stasis ulcers with osteoarthritis of right hip. Dr. Sharol Given plans to follow-up in office for debridement and further wound care. He does not recommend any intervention for right hip and we'll start compression as an outpatient for venous insufficiency without ulceration. Tobacco abuse: Cessation counseled extensively. Patient does not use CPAP for OSA. Continue amlodipine for hypertension which is well controlled. COPD stable. Type II DM with reasonable inpatient control. Compliance with medications counseled. Chronic pain syndrome-continue home medications. Chronic kidney disease: Creatinine at baseline.   Consultations:  Orthopedics: Dr. Sharol Given  Procedures:  None    Discharge Exam:  Complaints:  Denies complaints. Denies pain at the ulcer site or drainage today. States that she has an appointment at [facility] for biopsy tomorrow.  Filed Vitals:   04/19/15 2000 04/20/15 0250 04/20/15 0500 04/20/15 0911  BP: 141/70  116/68 144/63  Pulse: 88  79 73  Temp:   98 F (36.7 C) 98.1 F (36.7 C)  TempSrc:   Oral  Oral  Resp:   18 18  Height:      Weight:      SpO2: 90% 94% 94% 93%    General exam: Pleasant middle-aged female sitting up comfortably in chair this morning. Respiratory system: Clear. No increased work of breathing. Cardiovascular  system: S1 & S2 heard, RRR. No JVD, murmurs, gallops, clicks or pedal edema. Gastrointestinal system: Abdomen is nondistended, soft and nontender. Normal bowel sounds heard. Central nervous system: Alert and oriented. No focal neurological deficits. Extremities: Symmetric 5 x 5 power. Patient has 2 ulcers at sites indicated in picture below. These appear chronic with chronic indurated margins, deep, no drainage or foul odor at this time and no features suggestive of cellulitis.              Discharge Instructions      Discharge Instructions    Call MD for:  difficulty breathing, headache or visual disturbances    Complete by:  As directed      Call MD for:  extreme fatigue    Complete by:  As directed      Call MD for:  persistant dizziness or light-headedness    Complete by:  As directed      Call MD for:  persistant nausea and vomiting    Complete by:  As directed      Call MD for:  redness, tenderness, or signs of infection (pain, swelling, redness, odor or green/yellow discharge around incision site)    Complete by:  As directed      Call MD for:  severe uncontrolled pain    Complete by:  As directed      Call MD for:  temperature >100.4    Complete by:  As directed      Diet - low sodium heart healthy    Complete by:  As directed      Diet Carb Modified    Complete by:  As directed      Discharge instructions    Complete by:  As directed   Nonweightbearing on left lower extremity.     Increase activity slowly    Complete by:  As directed             Medication List    STOP taking these medications        predniSONE 20 MG tablet  Commonly known as:  DELTASONE      TAKE these medications        albuterol 108 (90 Base) MCG/ACT inhaler  Commonly known as:  PROVENTIL HFA;VENTOLIN HFA  Inhale 2 puffs into the lungs every 4 (four) hours as needed for wheezing or shortness of breath.     amLODipine 10 MG tablet  Commonly known as:  NORVASC  Take 1 tablet  (10 mg total) by mouth daily.     blood glucose meter kit and supplies Kit  Dispense based on patient and insurance preference. Use up to four times daily as directed. (FOR ICD-9 250.00, 250.01).     collagenase ointment  Commonly known as:  SANTYL  Apply topically daily. Apply to left foot ulcers plus dry dressing changes daily.     cyclobenzaprine 10 MG tablet  Commonly known as:  FLEXERIL  Take 10 mg by mouth at bedtime as needed for muscle spasms.     docusate sodium 100 MG capsule  Commonly known as:  COLACE  Take 100 mg by mouth daily as needed for mild constipation.     ibuprofen 200  MG tablet  Commonly known as:  ADVIL,MOTRIN  Take 400-600 mg by mouth every 6 (six) hours as needed for fever, headache or moderate pain.     Insulin Glargine 100 UNIT/ML Solostar Pen  Commonly known as:  LANTUS SOLOSTAR  Inject 34 Units into the skin daily. At 10AM     multivitamin tablet  Take 1 tablet by mouth daily.     NOVOLOG FLEXPEN 100 UNIT/ML FlexPen  Generic drug:  insulin aspart  Inject 3 Units into the skin 3 (three) times daily with meals.     Pen Needles 3/16" 31G X 5 MM Misc  Use as directed.     pregabalin 75 MG capsule  Commonly known as:  LYRICA  Take 75 mg by mouth 4 (four) times daily.     SUBOXONE 8-2 MG Film  Generic drug:  Buprenorphine HCl-Naloxone HCl  Place 1 Film under the tongue 3 (three) times daily.       Follow-up Information    Follow up with DUDA,MARCUS V, MD. Schedule an appointment as soon as possible for a visit in 1 week.   Specialty:  Orthopedic Surgery   Contact information:   [place] [address] [phone]       Follow up with EDWARDS, MICHELLE P, NP. Schedule an appointment as soon as possible for a visit in 1 week.   Specialty:  Internal Medicine   Contact information:   [place] [address] [phone]       Follow up with [facility].   Specialty:  Home  Health Services   Why:  Someone from [facility] will contact you concerning start date and time for therapy.   Contact information:   [place] [address] [phone]       Get Medicines reviewed and adjusted: Please take all your medications with you for your next visit with your Primary MD  Please request your Primary MD to go over all hospital tests and procedure/radiological results at the follow up. Please ask your Primary MD to get all Hospital records sent to his/her office.  If you experience worsening of your admission symptoms, develop shortness of breath, life threatening emergency, suicidal or homicidal thoughts you must seek medical attention immediately by calling 911 or calling your MD immediately if symptoms less severe.  You must read complete instructions/literature along with all the possible adverse reactions/side effects for all the Medicines you take and that have been prescribed to you. Take any new Medicines after you have completely understood and accept all the possible adverse reactions/side effects.   Do not drive when taking pain medications.   Do not take more than prescribed Pain, Sleep and Anxiety Medications  Special Instructions: If you have smoked or chewed Tobacco in the last 2 yrs please stop smoking, stop any regular Alcohol and or any Recreational drug use.  Wear Seat belts while driving.  Please note  You were cared for by a hospitalist during your hospital stay. Once you are discharged, your primary care physician will handle any further medical issues. Please note that NO REFILLS for any discharge medications will be authorized once you are discharged, as it is imperative that you return to your primary care physician (or establish a relationship with a primary care physician if you do not have one) for your aftercare needs so that they can reassess your need for medications and monitor your lab values.  The  results of significant diagnostics from this hospitalization (including imaging, microbiology, ancillary and laboratory) are listed below for reference.    Significant Diagnostic Studies: Dg Foot Complete Left  04/19/2015  CLINICAL DATA:  Open wound on the plantar surface of the foot at the first MTP joint. No known injury. Initial encounter. EXAM: LEFT FOOT - COMPLETE 3+ VIEW COMPARISON:  Plain films left foot 03/04/2007. FINDINGS: Wounds are seen on the plantar surface of the foot at the first MTP joint and along the lateral aspect of the foot at the level of the base of the fifth metatarsal. No radiopaque foreign body is identified. No bony destructive change or periosteal reaction is noted. Remote healed fourth and fifth metatarsal fractures are noted. IMPRESSION: Skin ulcerations on the foot without evidence of osteomyelitis. Remote healed fourth and fifth metatarsal fractures. Electronically Signed   By: Inge Rise M.D.   On: 04/19/2015 15:06   Dg Hip Unilat With Pelvis 2-3 Views Left  03/31/2015  CLINICAL DATA:  Fall today onto left hip on cement porch, now with posterior left hip pain. EXAM: DG HIP (WITH OR WITHOUT PELVIS) 2-3V LEFT COMPARISON:  None. FINDINGS: The cortical margins of the bony pelvis are intact. No fracture. Pubic symphysis and sacroiliac joints are congruent. Both femoral heads are well-seated in the respective acetabula. There is degenerative change of both hips, moderate on the right and mild on the left IMPRESSION: No fracture of the pelvis or left hip. Electronically Signed   By: Jeb Levering M.D.   On: 03/31/2015 23:34   Dg Hip Unilat With Pelvis 2-3 Views Right  04/20/2015  CLINICAL DATA:  Hip pain for 2 days. EXAM: DG HIP (WITH OR WITHOUT PELVIS) 2-3V RIGHT COMPARISON:  None. FINDINGS: No evidence of fracture. Degenerative changes are seen in the right hip. SI joints and symphysis pubis are unremarkable. IMPRESSION: Right hip osteoarthritis. Electronically Signed    By: Misty Stanley M.D.   On: 04/20/2015 00:22    Microbiology: Recent Results (from the past 240 hour(s))  Blood Cultures x 2 sites     Status: None (Preliminary result)   Collection Time: 04/19/15  9:51 PM  Result Value Ref Range Status   Specimen Description BLOOD RIGHT HAND  Final   Special Requests IN PEDIATRIC BOTTLE 3CC  Final   Culture NO GROWTH < 24 HOURS  Final   Report Status PENDING  Incomplete  Blood Cultures x 2 sites     Status: None (Preliminary result)   Collection Time: 04/19/15  9:51 PM  Result Value Ref Range Status   Specimen Description BLOOD RIGHT HAND  Final   Special Requests IN PEDIATRIC BOTTLE 3CC  Final   Culture NO GROWTH < 24 HOURS  Final   Report Status PENDING  Incomplete     Labs: Basic Metabolic Panel:  Recent Labs Lab 04/19/15 1456 04/20/15 0412  NA 142 143  K 4.0 3.5  CL 105 107  CO2 25 27  GLUCOSE 119* 89  BUN 22* 17  CREATININE 1.17* 1.00  CALCIUM 9.4 8.4*  MG  --  2.0  PHOS  --  3.3   Liver Function Tests:  Recent Labs Lab 04/19/15 1456 04/20/15 0412  AST 18 14*  ALT 17 13*  ALKPHOS 94 72  BILITOT 0.1* 0.4  PROT 7.1 6.0*  ALBUMIN 3.4* 2.8*   No results for input(s): LIPASE, AMYLASE in the last 168 hours. No results for input(s): AMMONIA in the last 168 hours. CBC:  Recent Labs Lab 04/19/15 1456 04/20/15 0412  WBC 13.4* 8.7  NEUTROABS 9.1*  --   HGB 14.5 12.5  HCT 43.5 38.8  MCV 84.8 85.8  PLT 310 262   Cardiac Enzymes: No results for input(s): CKTOTAL, CKMB, CKMBINDEX, TROPONINI in the last 168 hours. BNP: BNP (last 3 results) No results for input(s): BNP in the last 8760 hours.  ProBNP (last 3 results) No results for input(s): PROBNP in the last 8760 hours.  CBG:  Recent Labs Lab 04/19/15 2341 04/20/15 0623 04/20/15 1146  GLUCAP 139* 96 176*       Signed:  Shayna Eblen, MD, FACP, FHM. [facility] Pager [phone]  If 7PM-7AM, please contact  night-coverage [email or web address] Password [facility] 04/20/2015, 2:41 PM

## 2015-04-20 NOTE — Consult Note (Signed)
Reason for Consult: Diabetic insensate neuropathy with Earleen [place] grade 1 ulcers 3 left foot with right hip pain Referring Physician: Dr Algis Liming  Neldon Labella is an 60 y.o. female.  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 Patient reports approximately one week history of ulceration to the left foot with right hip pain which is positional. Patient also reports a history of 3 ruptured disks in her back for which she currently seeks treatment.  Past Medical History  Diagnosis Date  . Hypertension   . COPD (chronic obstructive pulmonary disease) ([place])   . Shortness of breath   . Arthritis   . Diabetic neuropathy ([place])   . Rash, skin     face  . Kidney stone   . Sleep apnea     has not used cpap past 10 yrs  . Nocturia   . Urge incontinence   . Morbid obesity with BMI of 40.0-44.9, adult ([place])   . Renal insufficiency   . Diabetes mellitus without complication ([place])     INSULIN DEPENDENT  . Neuropathy ([place])   . Head and neck cancer ([place]) 04/19/2015    Past Surgical History  Procedure Laterality Date  . Cholecystectomy    . Tubal ligation    . Carpel tunnel    . Cystoscopy w/ ureteral stent placement Left 03/22/2014    Procedure: CYSTOSCOPY WITH RETROGRADE PYELOGRAM/URETERAL STENT PLACEMENT;  Surgeon: Alexis Frock, MD;  Location: [facility];  Service: Urology;  Laterality: Left;  . Breast surgery  1982    LUMPECTOMY / benign  . Salivary stone removal    . Cystoscopy with retrograde pyelogram, ureteroscopy and stent placement Left 05/11/2014    Procedure: [place], URETEROSCOPY AND STENT PLACEMENT;  Surgeon: Alexis Frock, MD;  Location: [facility];  Service: Urology;  Laterality: Left;  . Holmium laser application Left 7/65/4650    Procedure: HOLMIUM LASER APPLICATION;  Surgeon: Alexis Frock, MD;  Location: [facility];  Service: Urology;   Laterality: Left;    Family History  Problem Relation Age of Onset  . Adopted: Yes    Social History:  reports that she has been smoking Cigarettes.  She has a 40 pack-year smoking history. She has never used smokeless tobacco. She reports that she does not drink alcohol or use illicit drugs.  Allergies:  Allergies  Allergen Reactions  . Ace Inhibitors Anaphylaxis  . Zestril [Lisinopril] Anaphylaxis and Swelling    Medications: I have reviewed the patient's current medications.  Results for orders placed or performed during the hospital encounter of 04/19/15 (from the past 48 hour(s))  CBC with Differential     Status: Abnormal   Collection Time: 04/19/15  2:56 PM  Result Value Ref Range   WBC 13.4 (H) 4.0 - 10.5 K/uL   RBC 5.13 (H) 3.87 - 5.11 MIL/uL   Hemoglobin 14.5 12.0 - 15.0 g/dL   HCT 43.5 36.0 - 46.0 %   MCV 84.8 78.0 - 100.0 fL   MCH 28.3 26.0 - 34.0 pg   MCHC 33.3 30.0 - 36.0 g/dL   RDW 13.3 11.5 - 15.5 %   Platelets 310 150 - 400 K/uL   Neutrophils Relative % 68 %   Neutro Abs 9.1 (H) 1.7 - 7.7 K/uL   Lymphocytes Relative 25 %   Lymphs Abs 3.3 0.7 - 4.0 K/uL   Monocytes Relative 4 %  Monocytes Absolute 0.6 0.1 - 1.0 K/uL   Eosinophils Relative 2 %   Eosinophils Absolute 0.3 0.0 - 0.7 K/uL   Basophils Relative 1 %   Basophils Absolute 0.1 0.0 - 0.1 K/uL  Comprehensive metabolic panel     Status: Abnormal   Collection Time: 04/19/15  2:56 PM  Result Value Ref Range   Sodium 142 135 - 145 mmol/L   Potassium 4.0 3.5 - 5.1 mmol/L   Chloride 105 101 - 111 mmol/L   CO2 25 22 - 32 mmol/L   Glucose, Bld 119 (H) 65 - 99 mg/dL   BUN 22 (H) 6 - 20 mg/dL   Creatinine, Ser 1.17 (H) 0.44 - 1.00 mg/dL   Calcium 9.4 8.9 - 10.3 mg/dL   Total Protein 7.1 6.5 - 8.1 g/dL   Albumin 3.4 (L) 3.5 - 5.0 g/dL   AST 18 15 - 41 U/L   ALT 17 14 - 54 U/L   Alkaline Phosphatase 94 38 - 126 U/L   Total Bilirubin 0.1 (L) 0.3 - 1.2 mg/dL   GFR calc non Af Amer 51 (L) >60 mL/min    GFR calc Af Amer 59 (L) >60 mL/min    Comment: (NOTE) The eGFR has been calculated using the CKD EPI equation. This calculation has not been validated in all clinical situations. eGFR's persistently <60 mL/min signify possible Chronic Kidney Disease.    Anion gap 12 5 - 15  Sedimentation rate     Status: Abnormal   Collection Time: 04/19/15  9:49 PM  Result Value Ref Range   Sed Rate 41 (H) 0 - 22 mm/hr  C-reactive protein     Status: Abnormal   Collection Time: 04/19/15  9:49 PM  Result Value Ref Range   CRP 8.0 (H) <1.0 mg/dL  Prealbumin     Status: None   Collection Time: 04/19/15  9:49 PM  Result Value Ref Range   Prealbumin 18.2 18 - 38 mg/dL  Glucose, capillary     Status: Abnormal   Collection Time: 04/19/15 11:41 PM  Result Value Ref Range   Glucose-Capillary 139 (H) 65 - 99 mg/dL  Magnesium     Status: None   Collection Time: 04/20/15  4:12 AM  Result Value Ref Range   Magnesium 2.0 1.7 - 2.4 mg/dL  Phosphorus     Status: None   Collection Time: 04/20/15  4:12 AM  Result Value Ref Range   Phosphorus 3.3 2.5 - 4.6 mg/dL  TSH     Status: None   Collection Time: 04/20/15  4:12 AM  Result Value Ref Range   TSH 2.150 0.350 - 4.500 uIU/mL  Comprehensive metabolic panel     Status: Abnormal   Collection Time: 04/20/15  4:12 AM  Result Value Ref Range   Sodium 143 135 - 145 mmol/L   Potassium 3.5 3.5 - 5.1 mmol/L   Chloride 107 101 - 111 mmol/L   CO2 27 22 - 32 mmol/L   Glucose, Bld 89 65 - 99 mg/dL   BUN 17 6 - 20 mg/dL   Creatinine, Ser 1.00 0.44 - 1.00 mg/dL   Calcium 8.4 (L) 8.9 - 10.3 mg/dL   Total Protein 6.0 (L) 6.5 - 8.1 g/dL   Albumin 2.8 (L) 3.5 - 5.0 g/dL   AST 14 (L) 15 - 41 U/L   ALT 13 (L) 14 - 54 U/L   Alkaline Phosphatase 72 38 - 126 U/L   Total Bilirubin 0.4 0.3 - 1.2 mg/dL  GFR calc non Af Amer >60 >60 mL/min   GFR calc Af Amer >60 >60 mL/min    Comment: (NOTE) The eGFR has been calculated using the CKD EPI equation. This calculation has  not been validated in all clinical situations. eGFR's persistently <60 mL/min signify possible Chronic Kidney Disease.    Anion gap 9 5 - 15  CBC     Status: None   Collection Time: 04/20/15  4:12 AM  Result Value Ref Range   WBC 8.7 4.0 - 10.5 K/uL   RBC 4.52 3.87 - 5.11 MIL/uL   Hemoglobin 12.5 12.0 - 15.0 g/dL   HCT 38.8 36.0 - 46.0 %   MCV 85.8 78.0 - 100.0 fL   MCH 27.7 26.0 - 34.0 pg   MCHC 32.2 30.0 - 36.0 g/dL   RDW 13.4 11.5 - 15.5 %   Platelets 262 150 - 400 K/uL  Glucose, capillary     Status: None   Collection Time: 04/20/15  6:23 AM  Result Value Ref Range   Glucose-Capillary 96 65 - 99 mg/dL    Dg Foot Complete Left  04/19/2015  CLINICAL DATA:  Open wound on the plantar surface of the foot at the first MTP joint. No known injury. Initial encounter. EXAM: LEFT FOOT - COMPLETE 3+ VIEW COMPARISON:  Plain films left foot 03/04/2007. FINDINGS: Wounds are seen on the plantar surface of the foot at the first MTP joint and along the lateral aspect of the foot at the level of the base of the fifth metatarsal. No radiopaque foreign body is identified. No bony destructive change or periosteal reaction is noted. Remote healed fourth and fifth metatarsal fractures are noted. IMPRESSION: Skin ulcerations on the foot without evidence of osteomyelitis. Remote healed fourth and fifth metatarsal fractures. Electronically Signed   By: Inge Rise M.D.   On: 04/19/2015 15:06   Dg Hip Unilat With Pelvis 2-3 Views Right  04/20/2015  CLINICAL DATA:  Hip pain for 2 days. EXAM: DG HIP (WITH OR WITHOUT PELVIS) 2-3V RIGHT COMPARISON:  None. FINDINGS: No evidence of fracture. Degenerative changes are seen in the right hip. SI joints and symphysis pubis are unremarkable. IMPRESSION: Right hip osteoarthritis. Electronically Signed   By: Misty Stanley M.D.   On: 04/20/2015 00:22    Review of Systems  All other systems reviewed and are negative.  Blood pressure 116/68, pulse 79, temperature 98 F  (36.7 C), temperature source Oral, resp. rate 18, height 5' 6"  (1.676 m), weight 99.791 kg (220 lb), SpO2 94 %. Physical Exam On examination patient is alert oriented no adenopathy she does have shortness of breath while being seated. Her abdomen is soft nontender. Examination of both legs she has venous stasis brawny skin color changes with pitting edema in both legs but no open ulcers. Examination of her feet she does have a good dorsalis pedis pulse she does not have protective sensation. She has 3 large ulcers on the plantar aspect of the left foot 1 beneath the second and third metatarsal head this does not probe the bone this is approximately 1 cm diameter and she has 2 ulcers over the base of the fifth metatarsal which also about a centimeter in diameter. There is no redness no cellulitis no drainage. Review of the radiographs shows no evidence of osteomyelitis of the left foot. Patient does have a healed fractures through the base of the fifth metatarsal as well as the base of the second and third and fourth metatarsals. This is  most likely a Charcot arthropathy patient reports no trauma to initiate the fractures. Examination of her hip she has no pain with passive range of motion radiographs shows osteoarthritis of the right hip worse than the left. 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  Plan: We'll have her start Santyl dressing changes strict nonweightbearing on her left foot I will follow-up in the office for debridement and further wound care. No intervention for the right hip and will start compression as an outpatient for her venous insufficiency without ulceration.  DUDA,MARCUS V 04/20/2015, 7:53  AM

## 2015-04-20 NOTE — Evaluation (Signed)
Physical Therapy Evaluation Patient Details Name: [name] MRN: [id number] DOB: 1955-11-28 Today's Date: 04/20/2015   History of Present Illness  59 year old female history of diabetes poorly controlled, hypertension poorly controlled recent brainstem aneurysm status post coiling and recently diagnosed head and neck mass which she is followed for by [facility] presents with diabetic foot ulcer with follow discharge and chills no evidence of osteomyelitis by plain imaging; chonic pain, R hip pain, noted pt reports 3 "bulging discs" during Ortho exam; Ortho to follow up Outpatient  Clinical Impression   Patient evaluated by Physical Therapy with no further acute PT needs identified, as the plan is to dc home today, and discharge summary is on the chart. All education has been completed and the patient has no further questions.  Have followed up with Case Mgr re: equipment recommendations;   See below for any follow-up Physical Therapy or equipment needs. PT is signing off. Thank you for this referral.     Follow Up Recommendations Home health PT;Supervision for mobility/OOB;Other (comment) (HHRN as well)    Equipment Recommendations  Wheelchair (measurements PT);Wheelchair cushion (measurements PT) (drop-arm BSC, long sliding board)    Recommendations for Other Services       Precautions / Restrictions Precautions Precautions: Fall Restrictions Weight Bearing Restrictions: Yes LLE Weight Bearing: Non weight bearing      Mobility  Bed Mobility Overal bed mobility: Needs Assistance Bed Mobility: Sit to Supine       Sit to supine: Supervision   General bed mobility comments: Increased time, and noted difficulty bringing RLE onto the bed  Transfers Overall transfer level: Needs assistance Equipment used: Rolling walker (2 wheeled) Transfers: Sit to/from Stand;Lateral/Scoot Transfers Sit to Stand: Mod assist        Lateral/Scoot Transfers: Min guard General transfer  comment: First attempted sit to stand, however Ms. Kolarik was unable to keep NWB with any standing activities; Performed lateral scoot transfers recliner to chair (without armrests) to bed, "uphill" both ways; Noted good strategy to keep NWB LLE by crossing L foot over Right; increased time and inefficient scoot, but not needing physical assist  Ambulation/Gait                Stairs            Wheelchair Mobility    Modified Rankin (Stroke Patients Only)       Balance                                             Pertinent Vitals/Pain Pain Assessment: Faces Faces Pain Scale: Hurts even more Pain Location: Mostly pain in R hip during transfers, some bil shoulder pain as well -- will be dependent on UEs and R hip while she is NWB LLE Pain Descriptors / Indicators: Aching;Grimacing Pain Intervention(s): Limited activity within patient's tolerance;Monitored during session;Repositioned    Home Living Family/patient expects to be discharged to:: Private residence Living Arrangements: Spouse/significant other;Children Available Help at Discharge: Family;Available 24 hours/day Type of Home: House Home Access: Stairs to enter   [employer] of Steps: 1 (and a ditch to maneuver around) Home Layout: Multi-level;Able to live on main level with bedroom/bathroom Home Equipment: Kasandra Knudsen - single point      Prior Function Level of Independence: Independent with assistive device(s)               Hand Dominance  Extremity/Trunk Assessment   Upper Extremity Assessment: Overall WFL for tasks assessed (for simple mobility tasks; pain in shoulders)           Lower Extremity Assessment: Generalized weakness;LLE deficits/detail;RLE deficits/detail (painful R hip with transfers) RLE Deficits / Details: Noted limited R knee flexion LLE Deficits / Details: Dressing on foot wounds intact; hip and knee ROM WFL for lateral scoot transfers; able  to keep NWB well with lateral scoot, but not with any standing     Communication   Communication: No difficulties  Cognition Arousal/Alertness: Awake/alert Behavior During Therapy: WFL for tasks assessed/performed Overall Cognitive Status: Within Functional Limits for tasks assessed                      General Comments      Exercises        Assessment/Plan    PT Assessment All further PT needs can be met in the next venue of care  PT Diagnosis Difficulty walking;Generalized weakness;Acute pain   PT Problem List Decreased strength;Decreased range of motion;Decreased activity tolerance;Decreased balance;Decreased mobility;Decreased knowledge of use of DME;Decreased knowledge of precautions;Pain;Obesity  PT Treatment Interventions     PT Goals (Current goals can be found in the Care Plan section) Acute Rehab PT Goals Patient Stated Goal: hopes to be able to manage at home PT Goal Formulation: All assessment and education complete, DC therapy    Frequency     Barriers to discharge        Co-evaluation               End of Session   Activity Tolerance: Patient tolerated treatment well Patient left: in bed;with call bell/phone within reach Nurse Communication: Other (comment) (messaged Case Mgr with equipment needs)         Time: 1427-1500 PT Time Calculation (min) (ACUTE ONLY): 33 min   Charges:   PT Evaluation $PT Eval Moderate Complexity: 1 Procedure PT Treatments $Therapeutic Activity: 8-22 mins   PT G Codes:        Quin Hoop 04/20/2015, 3:18 PM  Roney Marion, [place] Pager [phone] Office [phone]

## 2015-04-20 NOTE — Progress Notes (Signed)
Initial Nutrition Assessment  DOCUMENTATION CODES:   Obesity unspecified  INTERVENTION:  Provide 30 ml Prostat po BID, each supplement provides 100 kcal and 15 grams of protein.   Encourage adequate PO intake.   NUTRITION DIAGNOSIS:   Increased nutrient needs related to wound healing as evidenced by estimated needs.  GOAL:   Patient will meet greater than or equal to 90% of their needs  MONITOR:   PO intake, Supplement acceptance, Weight trends, Labs, I & O's, Skin  REASON FOR ASSESSMENT:   Consult Wound healing  ASSESSMENT:   Pt has a past medical history of Hypertension; COPD; Shortness of breath; Arthritis; Diabetic neuropathy; Rash, skin; Kidney stone; Sleep apnea; Nocturia; Urge incontinence; Renal insufficiency; Diabetes mellitus without complication; and Neuropathy. Presented with left foot ulcers unclear duration and now with foul discharge for 2 days, in the setting of poorly controlled DM and diabetic neuropathy. 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   Pt reports having a good appetite currently and PTA with consumption of 3 meals a day and no other difficulties. No percent meal completion recorded, however pt report consumption of most of the meals. Pt with weight loss, however reported it was intentional. Pt is agreeable to nutritional supplements to aid in wound healing. RD to order.   Pt with no observed significant fat or muscle mass loss.   Labs and medications reviewed.   Diet Order:  Diet Carb Modified Fluid consistency:: Thin; Room service appropriate?: Yes  Skin:  Wound (see comment) (Unstageable diabetic ulcer L foot)  Last BM:  2/17  Height:   Ht Readings from Last 1 Encounters:  04/19/15 '5\' 6"'$  (1.676 m)    Weight:   Wt Readings from Last 1 Encounters:  04/19/15 220 lb (99.791 kg)    Ideal Body Weight:  59 kg  BMI:  Body mass index is 35.53  kg/(m^2).  Estimated Nutritional Needs:   Kcal:  2000-2200  Protein:  110-125 grams  Fluid:  2- 2.2 L/day  EDUCATION NEEDS:   No education needs identified at this time  Corrin Parker, MS, RD, LDN Pager # [phone] After hours/ weekend pager # [phone]

## 2015-04-21 LAB — HEMOGLOBIN A1C
Hgb A1c MFr Bld: 5.7 % — ABNORMAL HIGH (ref 4.8–5.6)
Mean Plasma Glucose: 117 mg/dL

## 2015-04-24 LAB — CULTURE, BLOOD (ROUTINE X 2)
CULTURE: NO GROWTH
Culture: NO GROWTH

## 2015-08-21 ENCOUNTER — Other Ambulatory Visit (HOSPITAL_COMMUNITY): Payer: Self-pay | Admitting: Family

## 2015-08-24 ENCOUNTER — Encounter (HOSPITAL_COMMUNITY): Payer: Self-pay | Admitting: *Deleted

## 2015-08-24 MED ORDER — CEFAZOLIN SODIUM-DEXTROSE 2-4 GM/100ML-% IV SOLN
2.0000 g | INTRAVENOUS | Status: AC
  Administered 2015-08-25: 2 g via INTRAVENOUS
  Filled 2015-08-24: qty 100

## 2015-08-24 NOTE — Progress Notes (Signed)
SDW-pre-op call completed by both pt and pt son, Denyse Amass, with pt consent ( pt had a recent total laryngectomy) . Pt denies SOB, chest pain, and being under the care of a cardiologist. Pt stated that she had a stress test performed 20 years ago but denies having  a cardiac cath. Pt stated that her fasting blood glucose ranges between 100-150. Pt made aware to not take any Novolog the morning of surgery and to only take 15 units of Lantus Insulin, the morning of procedure after checking BS. Pt made aware of diabetes protocol to check BS every 2 hours prior to arrival, interventions for a BS <70 and > than 200. Spoke with Ebony Hail, [place], Anesthesia, regarding Suboxone. According to [place], [place], Dr. Conrad Vandling, Anesthesia, stated that it was okay for pt to continue taking Suboxone and Allison,PA, clarified that pt can take it on morning of procedure. Pt made aware to stop taking vitamins, fish oil and herbal medications. Pt verbalized understanding of all pre-op instructions. Anesthesia asked to review pt history (see note).

## 2015-08-24 NOTE — Progress Notes (Addendum)
Anesthesia Chart Review: SAME DAY WORK-UP.  Patient is a 60 year old female scheduled for left foot 5th ray amputation on 08/25/15 by Dr. Sharol Given. DX: Osteomyelitis.  History includes smoking, HTN, COPD, SOB, DM2 (insulin dependent) with diabetic neuropathy, OSA (no CPAP), renal insufficiency, head and neck cancer 04/19/15, cholecystectomy, nephrolithiasis, cystoscopy with left ureter stent '16. BMI is consistent with obesity. PCP is Juluis Mire, NP.  Med list currently includes albuterol, amlodipine, Suboxone SL TID, ibuprofen, Lantus, Novolog, Lyrica. Discussed with several anesthesiologists--okay to continue Suboxone for surgery.  03/02/15 EKG: NSR, RAE, incomplete right BBB, LAFB, cannot rule out anteroseptal infarct (age undetermined). No significant change since 03/20/14 tracing.   08/24/05 Echo: SUMMARY - The left ventricle was grossly normal. There was mild mitral annular calcification. This was a technically difficult study.  03/02/15 CXR: IMPRESSION: No active cardiopulmonary disease.  She needs updated labs prior to surgery. A1c was 5.7 04/20/15.  EKG is stable. If no acute changes and labs acceptable then I would anticipate that he could proceed as planned.  George Hugh [facility] Short Stay Center/Anesthesiology Phone [phone] 08/24/2015 1:29 PM  Addendum: PAT RN phone interview just completed. Patient reported that she is s/p laryngectomy (not previously listed in her history). On 05/26/15 she underwent:  - Direct laryngoscopy.  - Intubation by surgeon - Bilateral neck dissection levels 2, 3 and 4.  - Total laryngectomy.  - Left thyroid lobectomy - Bilateral sternocleidomastoid tissue advancement for coverage of carotid sheath.  - Dobbhoff tube placement.  She was diagnosed with [id number] supraglottic SCC. Multidisciplinary board did not recommend adjuvant therapy.  She was last seen by ENT Dr. Orrin Brigham 08/08/15 [facility], see Care Everywhere). She is receiving  ST for stoma/laryngectomy tube care and electrolarynx teaching. She was on a soft diet at that time.  Anesthesiologist to evaluation on the day of surgery to discuss the definitive anesthesia plan.  George Hugh [facility] Short Stay Center/Anesthesiology Phone [phone] 08/24/2015 5:44 PM

## 2015-08-25 ENCOUNTER — Encounter (HOSPITAL_COMMUNITY)
Admission: RE | Disposition: A | Discharge status: Home / Self Care | Payer: Self-pay | Source: Ambulatory Visit | Attending: Orthopedic Surgery

## 2015-08-25 ENCOUNTER — Ambulatory Visit (HOSPITAL_COMMUNITY): Payer: Medicaid Other | Admitting: Vascular Surgery

## 2015-08-25 ENCOUNTER — Ambulatory Visit (HOSPITAL_COMMUNITY)
Admission: RE | Admit: 2015-08-25 | Discharge: 2015-08-26 | Disposition: A | Discharge status: Home / Self Care | Payer: Medicaid Other | Source: Ambulatory Visit | Attending: Orthopedic Surgery | Admitting: Orthopedic Surgery

## 2015-08-25 DIAGNOSIS — I10 Essential (primary) hypertension: Secondary | ICD-10-CM | POA: Diagnosis not present

## 2015-08-25 DIAGNOSIS — M868X7 Other osteomyelitis, ankle and foot: Secondary | ICD-10-CM | POA: Diagnosis present

## 2015-08-25 DIAGNOSIS — D62 Acute posthemorrhagic anemia: Secondary | ICD-10-CM | POA: Diagnosis not present

## 2015-08-25 DIAGNOSIS — Z794 Long term (current) use of insulin: Secondary | ICD-10-CM | POA: Diagnosis not present

## 2015-08-25 DIAGNOSIS — G4730 Sleep apnea, unspecified: Secondary | ICD-10-CM | POA: Diagnosis not present

## 2015-08-25 DIAGNOSIS — J449 Chronic obstructive pulmonary disease, unspecified: Secondary | ICD-10-CM | POA: Diagnosis not present

## 2015-08-25 DIAGNOSIS — Z6835 Body mass index (BMI) 35.0-35.9, adult: Secondary | ICD-10-CM | POA: Insufficient documentation

## 2015-08-25 DIAGNOSIS — M869 Osteomyelitis, unspecified: Secondary | ICD-10-CM | POA: Diagnosis not present

## 2015-08-25 DIAGNOSIS — Z87891 Personal history of nicotine dependence: Secondary | ICD-10-CM | POA: Diagnosis not present

## 2015-08-25 DIAGNOSIS — E1140 Type 2 diabetes mellitus with diabetic neuropathy, unspecified: Secondary | ICD-10-CM | POA: Insufficient documentation

## 2015-08-25 DIAGNOSIS — E1169 Type 2 diabetes mellitus with other specified complication: Secondary | ICD-10-CM | POA: Diagnosis not present

## 2015-08-25 DIAGNOSIS — M86172 Other acute osteomyelitis, left ankle and foot: Secondary | ICD-10-CM | POA: Diagnosis present

## 2015-08-25 HISTORY — DX: Failed or difficult intubation, initial encounter: T88.4XXA

## 2015-08-25 HISTORY — DX: Fibromyalgia: M79.7

## 2015-08-25 HISTORY — DX: Anxiety disorder, unspecified: F41.9

## 2015-08-25 HISTORY — PX: AMPUTATION: SHX166

## 2015-08-25 HISTORY — DX: Pneumonia, unspecified organism: J18.9

## 2015-08-25 LAB — BASIC METABOLIC PANEL
ANION GAP: 9 (ref 5–15)
BUN: 19 mg/dL (ref 6–20)
CALCIUM: 9 mg/dL (ref 8.9–10.3)
CO2: 26 mmol/L (ref 22–32)
CREATININE: 1.09 mg/dL — AB (ref 0.44–1.00)
Chloride: 105 mmol/L (ref 101–111)
GFR calc Af Amer: >60 mL/min (ref >60)
GFR calc non Af Amer: 55 mL/min — ABNORMAL LOW (ref >60)
GLUCOSE: 104 mg/dL — AB (ref 65–99)
Potassium: 3.9 mmol/L (ref 3.5–5.1)
Sodium: 140 mmol/L (ref 135–145)

## 2015-08-25 LAB — CBC
HEMATOCRIT: 38.3 % (ref 36.0–46.0)
Hemoglobin: 12 g/dL (ref 12.0–15.0)
MCH: 27.3 pg (ref 26.0–34.0)
MCHC: 31.3 g/dL (ref 30.0–36.0)
MCV: 87 fL (ref 78.0–100.0)
Platelets: 231 10*3/uL (ref 150–400)
RBC: 4.4 MIL/uL (ref 3.87–5.11)
RDW: 13.7 % (ref 11.5–15.5)
WBC: 7.3 10*3/uL (ref 4.0–10.5)

## 2015-08-25 LAB — GLUCOSE, CAPILLARY
GLUCOSE-CAPILLARY: 104 mg/dL — AB (ref 65–99)
GLUCOSE-CAPILLARY: 110 mg/dL — AB (ref 65–99)
GLUCOSE-CAPILLARY: 90 mg/dL (ref 65–99)
Glucose-Capillary: 142 mg/dL — ABNORMAL HIGH (ref 65–99)

## 2015-08-25 SURGERY — AMPUTATION, FOOT, RAY
Anesthesia: Regional | Laterality: Left

## 2015-08-25 MED ORDER — SODIUM CHLORIDE 0.9 % IV SOLN
INTRAVENOUS | Status: DC
  Administered 2015-08-25: 15:00:00 via INTRAVENOUS

## 2015-08-25 MED ORDER — BUPRENORPHINE HCL 2 MG SL SUBL
8.0000 mg | SUBLINGUAL_TABLET | Freq: Three times a day (TID) | SUBLINGUAL | Status: DC
  Administered 2015-08-25 – 2015-08-26 (×2): 8 mg via SUBLINGUAL
  Filled 2015-08-25 (×3): qty 4

## 2015-08-25 MED ORDER — ONDANSETRON HCL 4 MG/2ML IJ SOLN
INTRAMUSCULAR | Status: DC | PRN
  Administered 2015-08-25: 4 mg via INTRAVENOUS

## 2015-08-25 MED ORDER — MIDAZOLAM HCL 2 MG/2ML IJ SOLN
INTRAMUSCULAR | Status: AC
  Administered 2015-08-25: 2 mg
  Filled 2015-08-25: qty 2

## 2015-08-25 MED ORDER — BUPIVACAINE-EPINEPHRINE (PF) 0.5% -1:200000 IJ SOLN
INTRAMUSCULAR | Status: DC | PRN
  Administered 2015-08-25: 30 mL via PERINEURAL

## 2015-08-25 MED ORDER — ACETAMINOPHEN 325 MG PO TABS: 650.0000 mg | ORAL_TABLET | Freq: Four times a day (QID) | ORAL | Status: DC | PRN

## 2015-08-25 MED ORDER — LACTATED RINGERS IV SOLN
INTRAVENOUS | Status: DC
  Administered 2015-08-25: 12:00:00 via INTRAVENOUS

## 2015-08-25 MED ORDER — FENTANYL CITRATE (PF) 100 MCG/2ML IJ SOLN: 50.0000 ug | Freq: Once | INTRAMUSCULAR | Status: DC

## 2015-08-25 MED ORDER — MEPERIDINE HCL 25 MG/ML IJ SOLN: 6.2500 mg | INTRAMUSCULAR | Status: DC | PRN

## 2015-08-25 MED ORDER — 0.9 % SODIUM CHLORIDE (POUR BTL) OPTIME
TOPICAL | Status: DC | PRN
  Administered 2015-08-25: 100 mL

## 2015-08-25 MED ORDER — HYDROMORPHONE HCL 1 MG/ML IJ SOLN: 0.2500 mg | INTRAMUSCULAR | Status: DC | PRN

## 2015-08-25 MED ORDER — ONDANSETRON HCL 4 MG/2ML IJ SOLN
INTRAMUSCULAR | Status: AC
  Filled 2015-08-25: qty 2

## 2015-08-25 MED ORDER — LIDOCAINE HCL (CARDIAC) 20 MG/ML IV SOLN
INTRAVENOUS | Status: DC | PRN
  Administered 2015-08-25: 100 mg via INTRAVENOUS

## 2015-08-25 MED ORDER — FENTANYL CITRATE (PF) 100 MCG/2ML IJ SOLN
INTRAMUSCULAR | Status: AC
  Administered 2015-08-25: 50 ug
  Filled 2015-08-25: qty 2

## 2015-08-25 MED ORDER — BUPRENORPHINE HCL 2 MG SL SUBL
2.0000 mg | SUBLINGUAL_TABLET | Freq: Every day | SUBLINGUAL | Status: DC
  Administered 2015-08-25: 2 mg via SUBLINGUAL

## 2015-08-25 MED ORDER — ONDANSETRON HCL 4 MG PO TABS: 4.0000 mg | ORAL_TABLET | Freq: Four times a day (QID) | ORAL | Status: DC | PRN

## 2015-08-25 MED ORDER — PROMETHAZINE HCL 25 MG/ML IJ SOLN: 6.2500 mg | INTRAMUSCULAR | Status: DC | PRN

## 2015-08-25 MED ORDER — MIDAZOLAM HCL 5 MG/5ML IJ SOLN
INTRAMUSCULAR | Status: DC | PRN
  Administered 2015-08-25: 2 mg via INTRAVENOUS

## 2015-08-25 MED ORDER — ALBUTEROL SULFATE (2.5 MG/3ML) 0.083% IN NEBU: 3.0000 mL | INHALATION_SOLUTION | RESPIRATORY_TRACT | Status: DC | PRN

## 2015-08-25 MED ORDER — OXYCODONE HCL 5 MG PO TABS: 5.0000 mg | ORAL_TABLET | ORAL | Status: DC | PRN

## 2015-08-25 MED ORDER — KETOROLAC TROMETHAMINE 15 MG/ML IJ SOLN
15.0000 mg | Freq: Four times a day (QID) | INTRAMUSCULAR | Status: AC
  Administered 2015-08-25 – 2015-08-26 (×4): 15 mg via INTRAVENOUS
  Filled 2015-08-25 (×4): qty 1

## 2015-08-25 MED ORDER — CHLORHEXIDINE GLUCONATE 4 % EX LIQD: 60.0000 mL | Freq: Once | CUTANEOUS | Status: DC

## 2015-08-25 MED ORDER — MIDAZOLAM HCL 2 MG/2ML IJ SOLN
2.0000 mg | Freq: Once | INTRAMUSCULAR | Status: DC
Start: 2015-08-25 — End: 2015-08-25

## 2015-08-25 MED ORDER — BUPRENORPHINE HCL 2 MG SL SUBL: 8.0000 mg | SUBLINGUAL_TABLET | Freq: Every day | SUBLINGUAL | Status: DC

## 2015-08-25 MED ORDER — AMLODIPINE BESYLATE 10 MG PO TABS
10.0000 mg | ORAL_TABLET | Freq: Every day | ORAL | Status: DC
  Administered 2015-08-26: 10 mg via ORAL
  Filled 2015-08-25: qty 1

## 2015-08-25 MED ORDER — HYDROMORPHONE HCL 1 MG/ML IJ SOLN
1.0000 mg | INTRAMUSCULAR | Status: DC | PRN
  Administered 2015-08-26: 1 mg via INTRAVENOUS
  Filled 2015-08-25: qty 1

## 2015-08-25 MED ORDER — PROPOFOL 10 MG/ML IV BOLUS
INTRAVENOUS | Status: AC
  Filled 2015-08-25: qty 20

## 2015-08-25 MED ORDER — LIDOCAINE 2% (20 MG/ML) 5 ML SYRINGE
INTRAMUSCULAR | Status: AC
  Filled 2015-08-25: qty 5

## 2015-08-25 MED ORDER — FENTANYL CITRATE (PF) 250 MCG/5ML IJ SOLN
INTRAMUSCULAR | Status: AC
  Filled 2015-08-25: qty 5

## 2015-08-25 MED ORDER — PREGABALIN 75 MG PO CAPS
75.0000 mg | ORAL_CAPSULE | Freq: Four times a day (QID) | ORAL | Status: DC
  Administered 2015-08-25 – 2015-08-26 (×3): 75 mg via ORAL
  Filled 2015-08-25 (×4): qty 1

## 2015-08-25 MED ORDER — ASPIRIN EC 325 MG PO TBEC
325.0000 mg | DELAYED_RELEASE_TABLET | Freq: Every day | ORAL | Status: DC
  Administered 2015-08-25 – 2015-08-26 (×2): 325 mg via ORAL
  Filled 2015-08-25: qty 1

## 2015-08-25 MED ORDER — ACETAMINOPHEN 650 MG RE SUPP: 650.0000 mg | Freq: Four times a day (QID) | RECTAL | Status: DC | PRN

## 2015-08-25 MED ORDER — MIDAZOLAM HCL 2 MG/2ML IJ SOLN
INTRAMUSCULAR | Status: AC
  Filled 2015-08-25: qty 2

## 2015-08-25 MED ORDER — ONDANSETRON HCL 4 MG/2ML IJ SOLN: 4.0000 mg | Freq: Four times a day (QID) | INTRAMUSCULAR | Status: DC | PRN

## 2015-08-25 MED ORDER — OXYCODONE-ACETAMINOPHEN 5-325 MG PO TABS: 1.0000 | ORAL_TABLET | ORAL | Status: DC | PRN

## 2015-08-25 MED ORDER — METHOCARBAMOL 500 MG PO TABS
500.0000 mg | ORAL_TABLET | Freq: Four times a day (QID) | ORAL | Status: DC | PRN
  Administered 2015-08-26: 500 mg via ORAL
  Filled 2015-08-25: qty 1

## 2015-08-25 MED ORDER — PROPOFOL 500 MG/50ML IV EMUL
INTRAVENOUS | Status: DC | PRN
  Administered 2015-08-25: 75 ug/kg/min via INTRAVENOUS

## 2015-08-25 MED ORDER — DOCUSATE SODIUM 100 MG PO CAPS: 100.0000 mg | ORAL_CAPSULE | Freq: Every day | ORAL | Status: DC | PRN

## 2015-08-25 MED ORDER — BUPRENORPHINE HCL 2 MG SL SUBL: 2.0000 mg | SUBLINGUAL_TABLET | Freq: Every day | SUBLINGUAL | Status: DC

## 2015-08-25 MED ORDER — BUPRENORPHINE HCL 2 MG SL SUBL
6.0000 mg | SUBLINGUAL_TABLET | Freq: Once | SUBLINGUAL | Status: AC
  Administered 2015-08-25: 6 mg via SUBLINGUAL
  Filled 2015-08-25: qty 3

## 2015-08-25 MED ORDER — INSULIN ASPART 100 UNIT/ML ~~LOC~~ SOLN
0.0000 [IU] | Freq: Three times a day (TID) | SUBCUTANEOUS | Status: DC
  Administered 2015-08-26 (×2): 3 [IU] via SUBCUTANEOUS

## 2015-08-25 MED ORDER — METOCLOPRAMIDE HCL 5 MG/ML IJ SOLN: 5.0000 mg | Freq: Three times a day (TID) | INTRAMUSCULAR | Status: DC | PRN

## 2015-08-25 MED ORDER — METHOCARBAMOL 1000 MG/10ML IJ SOLN
500.0000 mg | Freq: Four times a day (QID) | INTRAVENOUS | Status: DC | PRN
  Filled 2015-08-25: qty 5

## 2015-08-25 MED ORDER — METOCLOPRAMIDE HCL 5 MG PO TABS: 5.0000 mg | ORAL_TABLET | Freq: Three times a day (TID) | ORAL | Status: DC | PRN

## 2015-08-25 MED ORDER — BUPRENORPHINE HCL 8 MG SL SUBL: 8.0000 mg | SUBLINGUAL_TABLET | Freq: Every day | SUBLINGUAL | Status: DC

## 2015-08-25 MED ORDER — INSULIN ASPART 100 UNIT/ML ~~LOC~~ SOLN
6.0000 [IU] | Freq: Three times a day (TID) | SUBCUTANEOUS | Status: DC
  Administered 2015-08-26 (×2): 6 [IU] via SUBCUTANEOUS

## 2015-08-25 MED ORDER — CEFAZOLIN IN D5W 1 GM/50ML IV SOLN
1.0000 g | Freq: Four times a day (QID) | INTRAVENOUS | Status: AC
  Administered 2015-08-25 – 2015-08-26 (×3): 1 g via INTRAVENOUS
  Filled 2015-08-25 (×3): qty 50

## 2015-08-25 SURGICAL SUPPLY — 34 items
BLADE SAW SGTL MED 73X18.5 STR (BLADE) IMPLANT
BNDG COHESIVE 4X5 TAN STRL (GAUZE/BANDAGES/DRESSINGS) ×3 IMPLANT
BNDG GAUZE ELAST 4 BULKY (GAUZE/BANDAGES/DRESSINGS) ×3 IMPLANT
COVER SURGICAL LIGHT HANDLE (MISCELLANEOUS) ×6 IMPLANT
DRAPE U-SHAPE 47X51 STRL (DRAPES) ×6 IMPLANT
DRSG ADAPTIC 3X8 NADH LF (GAUZE/BANDAGES/DRESSINGS) ×3 IMPLANT
DRSG PAD ABDOMINAL 8X10 ST (GAUZE/BANDAGES/DRESSINGS) ×6 IMPLANT
DURAPREP 26ML APPLICATOR (WOUND CARE) ×3 IMPLANT
ELECT REM PT RETURN 9FT ADLT (ELECTROSURGICAL) ×3
ELECTRODE REM PT RTRN 9FT ADLT (ELECTROSURGICAL) ×1 IMPLANT
GAUZE SPONGE 4X4 12PLY STRL (GAUZE/BANDAGES/DRESSINGS) ×3 IMPLANT
GLOVE BIOGEL PI IND STRL 6.5 (GLOVE) IMPLANT
GLOVE BIOGEL PI IND STRL 7.0 (GLOVE) IMPLANT
GLOVE BIOGEL PI IND STRL 9 (GLOVE) ×1 IMPLANT
GLOVE BIOGEL PI INDICATOR 6.5 (GLOVE) ×2
GLOVE BIOGEL PI INDICATOR 7.0 (GLOVE) ×2
GLOVE BIOGEL PI INDICATOR 9 (GLOVE) ×4
GLOVE SURG ORTHO 9.0 STRL STRW (GLOVE) ×3 IMPLANT
GLOVE SURG SS PI 6.5 STRL IVOR (GLOVE) ×2 IMPLANT
GOWN STRL REUS W/ TWL LRG LVL3 (GOWN DISPOSABLE) ×1 IMPLANT
GOWN STRL REUS W/ TWL XL LVL3 (GOWN DISPOSABLE) ×2 IMPLANT
GOWN STRL REUS W/TWL LRG LVL3 (GOWN DISPOSABLE) ×3
GOWN STRL REUS W/TWL XL LVL3 (GOWN DISPOSABLE) ×6
KIT BASIN OR (CUSTOM PROCEDURE TRAY) ×3 IMPLANT
KIT ROOM TURNOVER OR (KITS) ×3 IMPLANT
NS IRRIG 1000ML POUR BTL (IV SOLUTION) ×3 IMPLANT
PACK ORTHO EXTREMITY (CUSTOM PROCEDURE TRAY) ×3 IMPLANT
PAD ABD 8X10 STRL (GAUZE/BANDAGES/DRESSINGS) ×2 IMPLANT
PAD ARMBOARD 7.5X6 YLW CONV (MISCELLANEOUS) ×6 IMPLANT
SPONGE GAUZE 4X4 12PLY STER LF (GAUZE/BANDAGES/DRESSINGS) ×2 IMPLANT
STOCKINETTE IMPERVIOUS LG (DRAPES) IMPLANT
SUT ETHILON 2 0 PSLX (SUTURE) ×6 IMPLANT
TOWEL OR 17X26 10 PK STRL BLUE (TOWEL DISPOSABLE) ×3 IMPLANT
UNDERPAD 30X30 INCONTINENT (UNDERPADS AND DIAPERS) ×3 IMPLANT

## 2015-08-25 NOTE — Op Note (Signed)
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    The risks benefits and alternatives were discussed with the patient preoperatively including but not limited to the risks of infection, bleeding, nerve injury, cardiopulmonary complications, the need for revision surgery, among others, and the patient was willing to proceed.  OPERATIVE IMPLANTS: None   OPERATIVE FINDINGS: Good petechial bleeding no deep abscess  OPERATIVE PROCEDURE: Patient brought the operating room after undergoing a popliteal block after adequate levels anesthesia obtained patient's left lower extremity was prepped using DuraPrep draped into a sterile field a timeout was called. Elliptical incision was made around the fifth toe and fifth metatarsal to encompass the ulcer on the plantar aspect of the base of the fifth metatarsal. This was then carried down to the base of the fifth metatarsal cuboid joint. The toe was amputated. Electrocautery was used for hemostasis saline was used for irrigation. The incision was closed using 2-0 nylon with local tissue rearrangement for wound closure 4 x 8 cm sterile compressive dressing was applied patient was taken to the PACU in stable condition

## 2015-08-25 NOTE — Progress Notes (Signed)
Plan for overnight observation discharged to home in the morning prescription for Percocet on the chart touchdown weightbearing on the left. Follow-up in the office in 1 week.

## 2015-08-25 NOTE — Progress Notes (Signed)
Orthopedic Tech Progress Note Patient Details:  [name] 1956/02/14 [id number]  Ortho Devices Type of Ortho Device: Postop shoe/boot Ortho Device/Splint Location: LLE foot Ortho Device/Splint Interventions: Ordered, Application   Braulio Bosch 08/25/2015, 4:07 PM

## 2015-08-25 NOTE — Progress Notes (Signed)
Pt states that she finished a 30 day course of Bactrim yesterday.

## 2015-08-25 NOTE — Transfer of Care (Signed)
Immediate Anesthesia Transfer of Care Note  Patient: [name]  Procedure(s) Performed: Procedure(s): 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 (Left)  Patient Location: PACU  Anesthesia Type:MAC  Level of Consciousness: awake, alert , oriented and patient cooperative  Airway & Oxygen Therapy: Patient Spontanous Breathing and Humidified O2 to stoma  Post-op Assessment: Report given to RN and Post -op Vital signs reviewed and stable  Post vital signs: Reviewed  Last Vitals:  Filed Vitals:   08/25/15 1215 08/25/15 1326  BP:    Pulse: 78   Temp:  36.6 C  Resp: 21     Last Pain:  Filed Vitals:   08/25/15 1327  PainSc: 5       Patients Stated Pain Goal: 3 (71/06/26 9485)  Complications: No apparent anesthesia complications

## 2015-08-25 NOTE — Anesthesia Preprocedure Evaluation (Addendum)
Anesthesia Evaluation  Patient identified by MRN, date of birth, ID band Patient awake    Reviewed: Allergy & Precautions, NPO status , Patient's Chart, lab work & pertinent test results  History of Anesthesia Complications (+) DIFFICULT AIRWAY and history of anesthetic complications  Airway Mallampati: II  TM Distance: >3 FB Neck ROM: Full    Dental no notable dental hx.    Pulmonary shortness of breath, sleep apnea , pneumonia, COPD, former smoker,    Pulmonary exam normal breath sounds clear to auscultation       Cardiovascular hypertension, + Peripheral Vascular Disease  Normal cardiovascular exam Rhythm:Regular Rate:Normal     Neuro/Psych PSYCHIATRIC DISORDERS Anxiety Depression  Neuromuscular disease    GI/Hepatic negative GI ROS, Neg liver ROS,   Endo/Other  diabetes  Renal/GU Renal disease     Musculoskeletal  (+) Arthritis , Fibromyalgia -  Abdominal (+) + obese,   Peds  Hematology negative hematology ROS (+)   Anesthesia Other Findings   Reproductive/Obstetrics negative OB ROS                            Anesthesia Physical Anesthesia Plan  ASA: III  Anesthesia Plan: Regional   Post-op Pain Management:    Induction:   Airway Management Planned:   Additional Equipment:   Intra-op Plan:   Post-operative Plan:   Informed Consent: I have reviewed the patients History and Physical, chart, labs and discussed the procedure including the risks, benefits and alternatives for the proposed anesthesia with the patient or authorized representative who has indicated his/her understanding and acceptance.   Dental advisory given  Plan Discussed with: CRNA  Anesthesia Plan Comments:         Anesthesia Quick Evaluation                                  Anesthesia Evaluation  Patient identified by MRN, date of birth, ID band Patient awake    Reviewed: Allergy &  Precautions, NPO status , Patient's Chart, lab work & pertinent test results  Airway Mallampati: II  TM Distance: >3 FB Neck ROM: Full    Dental  (+) Edentulous Upper, Edentulous Lower, Dental Advisory Given   Pulmonary shortness of breath, sleep apnea , pneumonia -, COPDCurrent Smoker, former smoker,  breath sounds clear to auscultation  Pulmonary exam normal       Cardiovascular hypertension, Pt. on medications + Peripheral Vascular Disease Rhythm:Regular Rate:Normal     Neuro/Psych negative neurological ROS  negative psych ROS   GI/Hepatic negative GI ROS, Neg liver ROS,   Endo/Other  diabetes, Well Controlled, Type 2, Oral Hypoglycemic AgentsMorbid obesity  Renal/GU ARFRenal disease     Musculoskeletal  (+) Arthritis -,   Abdominal (+) + obese,   Peds  Hematology negative hematology ROS (+)   Anesthesia Other Findings   Reproductive/Obstetrics negative OB ROS                             Anesthesia Physical Anesthesia Plan  ASA: III  Anesthesia Plan: General   Post-op Pain Management:    Induction: Intravenous  Airway Management Planned: LMA  Additional Equipment:   Intra-op Plan:   Post-operative Plan:   Informed Consent:   Plan Discussed with: Surgeon  Anesthesia Plan Comments:         Anesthesia Quick  Evaluation

## 2015-08-25 NOTE — Anesthesia Postprocedure Evaluation (Signed)
Anesthesia Post Note  Patient: [name]  Procedure(s) Performed: Procedure(s) (LRB): 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 (Left)  Patient location during evaluation: PACU Anesthesia Type: Regional Level of consciousness: awake and alert Pain management: pain level controlled Vital Signs Assessment: post-procedure vital signs reviewed and stable Respiratory status: spontaneous breathing Cardiovascular status: stable Anesthetic complications: no    Last Vitals:  Filed Vitals:   08/25/15 1430 08/25/15 1437  BP:  100/55  Pulse: 75 72  Temp: 36.6 C   Resp: 17 10    Last Pain:  Filed Vitals:   08/25/15 1441  PainSc: [place]

## 2015-08-25 NOTE — H&P (Signed)
[name] is an 60 y.o. female.   Chief Complaint: Osteomyelitis ulceration left foot fifth metatarsal HPI: Patient is a 60 year old woman with diabetic insensate neuropathy morbid obesity with ulceration osteomyelitis left foot fifth metatarsal head.  Past Medical History  Diagnosis Date  . Hypertension   . COPD (chronic obstructive pulmonary disease) ([place])   . Shortness of breath   . Arthritis   . Diabetic neuropathy ([place])   . Rash, skin     face  . Kidney stone   . Sleep apnea     has not used cpap past 10 yrs  . Nocturia   . Urge incontinence   . Morbid obesity with BMI of 40.0-44.9, adult ([place])   . Renal insufficiency   . Diabetes mellitus without complication ([place])     INSULIN DEPENDENT  . Neuropathy ([place])   . Head and neck cancer ([place]) 04/19/2015  . Pneumonia   . Depression   . Anxiety   . Fibromyalgia   . Difficult intubation     total laryngectomy    Past Surgical History  Procedure Laterality Date  . Cholecystectomy    . Tubal ligation    . Carpel tunnel    . Cystoscopy w/ ureteral stent placement Left 03/22/2014    Procedure: CYSTOSCOPY WITH RETROGRADE PYELOGRAM/URETERAL STENT PLACEMENT;  Surgeon: Alexis Frock, MD;  Location: [facility];  Service: Urology;  Laterality: Left;  . Breast surgery  1982    LUMPECTOMY / benign  . Salivary stone removal    . Cystoscopy with retrograde pyelogram, ureteroscopy and stent placement Left 05/11/2014    Procedure: [place], URETEROSCOPY AND STENT PLACEMENT;  Surgeon: Alexis Frock, MD;  Location: [facility];  Service: Urology;  Laterality: Left;  . Holmium laser application Left 08/26/7791    Procedure: HOLMIUM LASER APPLICATION;  Surgeon: Alexis Frock, MD;  Location: [facility];  Service: Urology;  Laterality: Left;  . Brain surgery      vertebral aneurysm  . Tonsillectomy    . Laryngectomy      total ( stoma)    Family History  Problem Relation Age of Onset  . Adopted: Yes   Social History:   reports that she has quit smoking. Her smoking use included Cigarettes. She has a 40 pack-year smoking history. She has never used smokeless tobacco. She reports that she does not drink alcohol or use illicit drugs.  Allergies:  Allergies  Allergen Reactions  . Ace Inhibitors Anaphylaxis and Swelling  . Zestril [Lisinopril] Anaphylaxis and Swelling    No prescriptions prior to admission    No results found for this or any previous visit (from the past 48 hour(s)). No results found.  Review of Systems  All other systems reviewed and are negative.   There were no vitals taken for this visit. Physical Exam  On examination patient has a palpable dorsalis pedis pulse is ulceration osteomyelitis left foot fifth metatarsal head. Assessment/Plan Assessment: Diabetic insensate neuropathy with ulceration Wagner grade 3 ulcer fifth metatarsal left foot.  Plan: We'll plan for left foot fifth ray amputation. Risks and benefits were discussed including persistent infection nonhealing of the wound need for additional surgery. Patient states she understand and wish to proceed at this time.  Newt Minion, MD 08/25/2015, 6:24 AM

## 2015-08-25 NOTE — Anesthesia Procedure Notes (Addendum)
Procedure Name: MAC Date/Time: 08/25/2015 12:45 PM Performed by: Jenne Campus Pre-anesthesia Checklist: Patient identified, Emergency Drugs available, Suction available, Patient being monitored and Timeout performed Patient Re-evaluated:Patient Re-evaluated prior to inductionOxygen Delivery Method: Simple face mask Comments: Patient had laryngectomy 04/2014. FM @ 10L O2 to stoma.   Anesthesia Regional Block:  Popliteal block  Pre-Anesthetic Checklist: ,, timeout performed, Correct Patient, Correct Site, Correct Laterality, Correct Procedure, Correct Position, site marked, Risks and benefits discussed, Surgical consent,  Pre-op evaluation,  Post-op pain management  Laterality: Left  Prep: chloraprep       Needles:  Injection technique: Single-shot  Needle Type: Stimiplex     Needle Length: 10cm 10 cm Needle Gauge: 21 and 21 G    Additional Needles:  Procedures: ultrasound guided (picture in chart) and nerve stimulator  Motor weakness within 5 minutes. Popliteal block  Nerve Stimulator or Paresthesia:  Response: Plantar flexion/toe flexion, 0.5 mA,   Additional Responses:   Narrative:  Injection made incrementally with aspirations every 5 mL.  Performed by: Personally  Anesthesiologist: Nolon Nations  Additional Notes: Nerve located and needle positioned with direct ultrasound guidance. Good perineural spread. Patient tolerated well.

## 2015-08-26 ENCOUNTER — Encounter (HOSPITAL_COMMUNITY): Payer: Self-pay | Admitting: Orthopedic Surgery

## 2015-08-26 DIAGNOSIS — M868X7 Other osteomyelitis, ankle and foot: Secondary | ICD-10-CM | POA: Diagnosis not present

## 2015-08-26 LAB — HEMOGLOBIN A1C
Hgb A1c MFr Bld: 6 % — ABNORMAL HIGH (ref 4.8–5.6)
Mean Plasma Glucose: 126 mg/dL

## 2015-08-26 LAB — GLUCOSE, CAPILLARY
GLUCOSE-CAPILLARY: 126 mg/dL — AB (ref 65–99)
GLUCOSE-CAPILLARY: 98 mg/dL (ref 65–99)
Glucose-Capillary: 160 mg/dL — ABNORMAL HIGH (ref 65–99)
Glucose-Capillary: 130 mg/dL — ABNORMAL HIGH (ref 65–99)

## 2015-08-26 NOTE — Evaluation (Addendum)
Physical Therapy Evaluation Patient Details Name: [name] MRN: [id number] DOB: [birth year] Today's Date: 08/26/2015   History of Present Illness  60 year old woman with diabetic insensate neuropathy and morbid obesity with ulceration osteomyelitis left foot fifth metatarsal head. She underwent L 5th ray amputation 08-25-15.  Clinical Impression  Pt admitted for above. On eval, she was mod I with bed mobility and min assist stand pivot transfers maintaining TDWB LLE. She will have 24-hour assist from family at home. She has all needed DME. Recommend HHPT. Pt to d/c home today. PT signing off.    Follow Up Recommendations Home health PT;Supervision for mobility/OOB    Equipment Recommendations  None recommended by PT    Recommendations for Other Services       Precautions / Restrictions Precautions Precautions: Fall Required Braces or Orthoses: Other Brace/Splint Other Brace/Splint: post-op shoe L Restrictions Weight Bearing Restrictions: Yes LLE Weight Bearing: Touchdown weight bearing      Mobility  Bed Mobility Overal bed mobility: Modified Independent             General bed mobility comments: +rail  Transfers Overall transfer level: Needs assistance Equipment used: None Transfers: Stand Pivot Transfers   Stand pivot transfers: Min assist       General transfer comment: SPT bed <> BSC; Pt able to maintain TDWB LLE.  Ambulation/Gait             General Gait Details: unable   Stairs            Wheelchair Mobility    Modified Rankin (Stroke Patients Only)       Balance                                             Pertinent Vitals/Pain Pain Assessment: 0-10 Pain Score: 6  Pain Location: L foot Pain Descriptors / Indicators: Sore Pain Intervention(s): Monitored during session;Premedicated before session    Home Living Family/patient expects to be discharged to:: Private residence Living Arrangements:  Spouse/significant other;Children Available Help at Discharge: Family;Available 24 hours/day Type of Home: House Home Access: Stairs to enter Entrance Stairs-Rails: None Entrance Stairs-Number of Steps: 1 Home Layout: Multi-level;Able to live on main level with bedroom/bathroom Home Equipment: Gilford Rile - 2 wheels;Wheelchair - manual;Bedside commode      Prior Function Level of Independence: Needs assistance   Gait / Transfers Assistance Needed: Ambulated short distances in home with RW due to w/c not fitting through doorway to bathroom.  ADL's / Homemaking Assistance Needed: Assist with meals and housekeeping. Occasional set up help with ADLs.        Hand Dominance   Dominant Hand: Right    Extremity/Trunk Assessment                         Communication   Communication: Passy-Muir valve (recent total laryngectomy)  Cognition Arousal/Alertness: Awake/alert Behavior During Therapy: WFL for tasks assessed/performed Overall Cognitive Status: Within Functional Limits for tasks assessed                      General Comments      Exercises        Assessment/Plan    PT Assessment All further PT needs can be met in the next venue of care  PT Diagnosis Difficulty walking;Generalized weakness;Acute pain   PT Problem List  Decreased strength;Decreased activity tolerance;Decreased mobility;Pain;Decreased knowledge of precautions  PT Treatment Interventions     PT Goals (Current goals can be found in the Care Plan section) Acute Rehab PT Goals Patient Stated Goal: walk again PT Goal Formulation: All assessment and education complete, DC therapy    Frequency     Barriers to discharge        Co-evaluation               End of Session Equipment Utilized During Treatment: Gait belt Activity Tolerance: Patient tolerated treatment well Patient left: in bed;with family/visitor present;with call bell/phone within reach Nurse Communication: Mobility  status;Weight bearing status    Functional Assessment Tool Used: clinical judgement Functional Limitation: Mobility: Walking and moving around Mobility: Walking and Moving Around Current Status (R4854): At least 20 percent but less than 40 percent impaired, limited or restricted Mobility: Walking and Moving Around Goal Status [phone]): At least 20 percent but less than 40 percent impaired, limited or restricted Mobility: Walking and Moving Around Discharge Status [phone]): At least 20 percent but less than 40 percent impaired, limited or restricted    Time: 0959-1035 PT Time Calculation (min) (ACUTE ONLY): 36 min   Charges:   PT Evaluation $PT Eval Moderate Complexity: 1 Procedure PT Treatments $Therapeutic Activity: 8-22 mins   PT G Codes:   PT G-Codes **NOT FOR INPATIENT CLASS** Functional Assessment Tool Used: clinical judgement Functional Limitation: Mobility: Walking and moving around Mobility: Walking and Moving Around Current Status (G1829): At least 20 percent but less than 40 percent impaired, limited or restricted Mobility: Walking and Moving Around Goal Status [phone]): At least 20 percent but less than 40 percent impaired, limited or restricted Mobility: Walking and Moving Around Discharge Status [phone]): At least 20 percent but less than 40 percent impaired, limited or restricted    Lorriane Shire 08/26/2015, 12:19 PM

## 2015-08-26 NOTE — Care Management Note (Signed)
Case Management Note  Patient Details  Name: [name] MRN: [id number] Date of Birth: April 26, 1955  Subjective/Objective: 60 yo F with ulceration osteomyelitis L foot fifth metatarsal head. She underwent L 5th ray amputation.             Action/Plan: PT is recommending HHPT   Expected Discharge Date:   08/26/15               Expected Discharge Plan:  Home/Self Care  In-House Referral:     Discharge planning Services  CM Consult  Post Acute Care Choice:    Choice offered to:     DME Arranged:    DME Agency:     HH Arranged:    HH Agency:     Status of Service:  Completed, signed off  If discussed at [employer] of Stay Meetings, dates discussed:    Additional Comments: met with pt and son. D/C plan is to return home with the support of her family. She has a W/C, RW, and a BSC. PT is recommending HHPT. Pt has Medicaid. Contacted Tiffany at [facility] to discuss referral, but due to her medical insurance and her dx (L 5th ray amputation) she won't qualify for HHPT. Informed pt that we won't be able to arrange HHPT due to her dx and having Medicaid. Discussed outpt rehab, but she prefers therapy at home and they don't have a car. She reports that she has good family support and she can do her own therapy at home.  Norina Buzzard, RN 08/26/2015, 2:24 PM

## 2015-08-26 NOTE — Progress Notes (Signed)
   Subjective:  Patient is stable. No events.  Objective:   VITALS:   Filed Vitals:   08/25/15 1437 08/25/15 1526 08/25/15 2100 08/26/15 0520  BP: 100/55 116/65 142/60 132/71  Pulse: 72 68 73 80  Temp:  97.9 F (36.6 C) 98.7 F (37.1 C) 99.2 F (37.3 C)  TempSrc:  Oral Oral Oral  Resp: '10 16 16 17  '$ Height:      Weight:      SpO2: 91% 92% 100% 92%    Dressing c/d/i   Lab Results  Component Value Date   WBC 7.3 08/25/2015   HGB 12.0 08/25/2015   HCT 38.3 08/25/2015   MCV 87.0 08/25/2015   PLT 231 08/25/2015     Assessment/Plan:  1 Day Post-Op   - Expected postop acute blood loss anemia - will monitor for symptoms - Up with PT/OT - DVT ppx - SCDs, ambulation, aspirin - TDWB operative extremity - Pain control - Discharge planning - patient may be slow to mobilize with PT.  May dc home today if she is able to clear PT.  Marianna Payment 08/26/2015, 8:16 AM [phone]

## 2015-08-26 NOTE — Progress Notes (Signed)
[name] to be D/C'd Home per MD order.  Discussed with the patient and all questions fully answered.  VSS, Skin clean, dry and intact without evidence of skin break down, no evidence of skin tears noted. IV catheter discontinued intact. Site without signs and symptoms of complications. Dressing and pressure applied.  An After Visit Summary was printed and given to the patient. Patient received prescription.  D/c education completed with patient/family including follow up instructions, medication list, d/c activities limitations if indicated, with other d/c instructions as indicated by MD - patient able to verbalize understanding, all questions fully answered.   Patient instructed to return to ED, call 911, or call MD for any changes in condition.   Patient will be escorted via WC, and D/C home via private auto.  Jerry Caras 08/26/2015 4:03 PM

## 2015-09-08 ENCOUNTER — Emergency Department (HOSPITAL_COMMUNITY)
Admission: EM | Admit: 2015-09-08 | Discharge: 2015-09-08 | Disposition: A | Discharge status: Home / Self Care | Payer: Medicaid Other | Source: Home / Self Care | Attending: Emergency Medicine | Admitting: Emergency Medicine

## 2015-09-08 ENCOUNTER — Encounter (HOSPITAL_COMMUNITY): Payer: Self-pay | Admitting: *Deleted

## 2015-09-08 DIAGNOSIS — Z87891 Personal history of nicotine dependence: Secondary | ICD-10-CM | POA: Insufficient documentation

## 2015-09-08 DIAGNOSIS — E1140 Type 2 diabetes mellitus with diabetic neuropathy, unspecified: Secondary | ICD-10-CM

## 2015-09-08 DIAGNOSIS — M79673 Pain in unspecified foot: Secondary | ICD-10-CM

## 2015-09-08 DIAGNOSIS — M7989 Other specified soft tissue disorders: Secondary | ICD-10-CM | POA: Insufficient documentation

## 2015-09-08 DIAGNOSIS — I10 Essential (primary) hypertension: Secondary | ICD-10-CM

## 2015-09-08 DIAGNOSIS — Z5321 Procedure and treatment not carried out due to patient leaving prior to being seen by health care provider: Secondary | ICD-10-CM

## 2015-09-08 DIAGNOSIS — J449 Chronic obstructive pulmonary disease, unspecified: Secondary | ICD-10-CM

## 2015-09-08 LAB — COMPREHENSIVE METABOLIC PANEL
ALBUMIN: 3.6 g/dL (ref 3.5–5.0)
ALT: 15 U/L (ref 14–54)
AST: 17 U/L (ref 15–41)
Alkaline Phosphatase: 89 U/L (ref 38–126)
Anion gap: 8 (ref 5–15)
BILIRUBIN TOTAL: 0.4 mg/dL (ref 0.3–1.2)
BUN: 20 mg/dL (ref 6–20)
CO2: 28 mmol/L (ref 22–32)
Calcium: 9.3 mg/dL (ref 8.9–10.3)
Chloride: 103 mmol/L (ref 101–111)
Creatinine, Ser: 1.32 mg/dL — ABNORMAL HIGH (ref 0.44–1.00)
GFR calc Af Amer: 51 mL/min — ABNORMAL LOW (ref >60)
GFR calc non Af Amer: 44 mL/min — ABNORMAL LOW (ref >60)
GLUCOSE: 115 mg/dL — AB (ref 65–99)
POTASSIUM: 3.5 mmol/L (ref 3.5–5.1)
SODIUM: 139 mmol/L (ref 135–145)
TOTAL PROTEIN: 6.9 g/dL (ref 6.5–8.1)

## 2015-09-08 LAB — CBC WITH DIFFERENTIAL/PLATELET
BASOS PCT: 1 %
Basophils Absolute: 0.1 10*3/uL (ref 0.0–0.1)
EOS ABS: 0.3 10*3/uL (ref 0.0–0.7)
EOS PCT: 3 %
HCT: 37.7 % (ref 36.0–46.0)
Hemoglobin: 12.1 g/dL (ref 12.0–15.0)
LYMPHS ABS: 2.3 10*3/uL (ref 0.7–4.0)
Lymphocytes Relative: 27 %
MCH: 27.3 pg (ref 26.0–34.0)
MCHC: 32.1 g/dL (ref 30.0–36.0)
MCV: 84.9 fL (ref 78.0–100.0)
MONOS PCT: 4 %
Monocytes Absolute: 0.3 10*3/uL (ref 0.1–1.0)
Neutro Abs: 5.4 10*3/uL (ref 1.7–7.7)
Neutrophils Relative %: 65 %
PLATELETS: 265 10*3/uL (ref 150–400)
RBC: 4.44 MIL/uL (ref 3.87–5.11)
RDW: 13.8 % (ref 11.5–15.5)
WBC: 8.3 10*3/uL (ref 4.0–10.5)

## 2015-09-08 LAB — I-STAT CG4 LACTIC ACID, ED: Lactic Acid, Venous: 1.77 mmol/L (ref 0.5–1.9)

## 2015-09-08 NOTE — ED Notes (Signed)
Pt came to Nurse 1st with Daughter and said she was leaving. Told Pt she was next to go back. But Daughter and Pt said they were not staying. Pt's Daughter took pt and left.

## 2015-09-08 NOTE — ED Notes (Signed)
Pt is here with left toe problems since November, DM and now they feel the infection has spread to the bone and she may need to have her left foot amputated.  Pt has left DPP palpable

## 2015-09-09 ENCOUNTER — Inpatient Hospital Stay (HOSPITAL_COMMUNITY)
Admission: EM | Admit: 2015-09-09 | Discharge: 2015-09-15 | DRG: 475 | Disposition: A | Discharge status: Home / Self Care | Payer: Medicaid Other | Attending: Family Medicine | Admitting: Family Medicine

## 2015-09-09 ENCOUNTER — Emergency Department (HOSPITAL_COMMUNITY): Payer: Medicaid Other

## 2015-09-09 ENCOUNTER — Encounter (HOSPITAL_COMMUNITY): Payer: Self-pay | Admitting: *Deleted

## 2015-09-09 DIAGNOSIS — R269 Unspecified abnormalities of gait and mobility: Secondary | ICD-10-CM | POA: Diagnosis not present

## 2015-09-09 DIAGNOSIS — R030 Elevated blood-pressure reading, without diagnosis of hypertension: Secondary | ICD-10-CM | POA: Diagnosis not present

## 2015-09-09 DIAGNOSIS — Z888 Allergy status to other drugs, medicaments and biological substances status: Secondary | ICD-10-CM | POA: Diagnosis not present

## 2015-09-09 DIAGNOSIS — N183 Chronic kidney disease, stage 3 (moderate): Secondary | ICD-10-CM | POA: Diagnosis present

## 2015-09-09 DIAGNOSIS — E1140 Type 2 diabetes mellitus with diabetic neuropathy, unspecified: Secondary | ICD-10-CM | POA: Diagnosis present

## 2015-09-09 DIAGNOSIS — Z87891 Personal history of nicotine dependence: Secondary | ICD-10-CM

## 2015-09-09 DIAGNOSIS — I4510 Unspecified right bundle-branch block: Secondary | ICD-10-CM | POA: Diagnosis present

## 2015-09-09 DIAGNOSIS — F419 Anxiety disorder, unspecified: Secondary | ICD-10-CM | POA: Diagnosis present

## 2015-09-09 DIAGNOSIS — A419 Sepsis, unspecified organism: Secondary | ICD-10-CM | POA: Diagnosis not present

## 2015-09-09 DIAGNOSIS — E1169 Type 2 diabetes mellitus with other specified complication: Secondary | ICD-10-CM | POA: Diagnosis present

## 2015-09-09 DIAGNOSIS — G4733 Obstructive sleep apnea (adult) (pediatric): Secondary | ICD-10-CM | POA: Diagnosis present

## 2015-09-09 DIAGNOSIS — I129 Hypertensive chronic kidney disease with stage 1 through stage 4 chronic kidney disease, or unspecified chronic kidney disease: Secondary | ICD-10-CM | POA: Diagnosis present

## 2015-09-09 DIAGNOSIS — I10 Essential (primary) hypertension: Secondary | ICD-10-CM | POA: Diagnosis present

## 2015-09-09 DIAGNOSIS — G8918 Other acute postprocedural pain: Secondary | ICD-10-CM | POA: Diagnosis not present

## 2015-09-09 DIAGNOSIS — E11621 Type 2 diabetes mellitus with foot ulcer: Secondary | ICD-10-CM | POA: Diagnosis not present

## 2015-09-09 DIAGNOSIS — B999 Unspecified infectious disease: Secondary | ICD-10-CM

## 2015-09-09 DIAGNOSIS — E1122 Type 2 diabetes mellitus with diabetic chronic kidney disease: Secondary | ICD-10-CM | POA: Diagnosis present

## 2015-09-09 DIAGNOSIS — M797 Fibromyalgia: Secondary | ICD-10-CM | POA: Diagnosis present

## 2015-09-09 DIAGNOSIS — L97504 Non-pressure chronic ulcer of other part of unspecified foot with necrosis of bone: Secondary | ICD-10-CM

## 2015-09-09 DIAGNOSIS — Z6841 Body Mass Index (BMI) 40.0 and over, adult: Secondary | ICD-10-CM | POA: Diagnosis not present

## 2015-09-09 DIAGNOSIS — J449 Chronic obstructive pulmonary disease, unspecified: Secondary | ICD-10-CM | POA: Diagnosis present

## 2015-09-09 DIAGNOSIS — M1711 Unilateral primary osteoarthritis, right knee: Secondary | ICD-10-CM | POA: Diagnosis not present

## 2015-09-09 DIAGNOSIS — T148XXA Other injury of unspecified body region, initial encounter: Secondary | ICD-10-CM

## 2015-09-09 DIAGNOSIS — Z89422 Acquired absence of other left toe(s): Secondary | ICD-10-CM | POA: Diagnosis not present

## 2015-09-09 DIAGNOSIS — M549 Dorsalgia, unspecified: Secondary | ICD-10-CM | POA: Diagnosis present

## 2015-09-09 DIAGNOSIS — T148 Other injury of unspecified body region: Secondary | ICD-10-CM | POA: Diagnosis not present

## 2015-09-09 DIAGNOSIS — E1159 Type 2 diabetes mellitus with other circulatory complications: Secondary | ICD-10-CM | POA: Insufficient documentation

## 2015-09-09 DIAGNOSIS — Z794 Long term (current) use of insulin: Secondary | ICD-10-CM | POA: Diagnosis not present

## 2015-09-09 DIAGNOSIS — M792 Neuralgia and neuritis, unspecified: Secondary | ICD-10-CM | POA: Diagnosis not present

## 2015-09-09 DIAGNOSIS — E1142 Type 2 diabetes mellitus with diabetic polyneuropathy: Secondary | ICD-10-CM | POA: Diagnosis not present

## 2015-09-09 DIAGNOSIS — R6510 Systemic inflammatory response syndrome (SIRS) of non-infectious origin without acute organ dysfunction: Secondary | ICD-10-CM | POA: Diagnosis present

## 2015-09-09 DIAGNOSIS — L089 Local infection of the skin and subcutaneous tissue, unspecified: Secondary | ICD-10-CM

## 2015-09-09 DIAGNOSIS — Z89512 Acquired absence of left leg below knee: Secondary | ICD-10-CM | POA: Diagnosis not present

## 2015-09-09 DIAGNOSIS — K5903 Drug induced constipation: Secondary | ICD-10-CM | POA: Diagnosis not present

## 2015-09-09 DIAGNOSIS — J410 Simple chronic bronchitis: Secondary | ICD-10-CM | POA: Diagnosis not present

## 2015-09-09 DIAGNOSIS — G894 Chronic pain syndrome: Secondary | ICD-10-CM | POA: Diagnosis present

## 2015-09-09 DIAGNOSIS — M62838 Other muscle spasm: Secondary | ICD-10-CM | POA: Diagnosis not present

## 2015-09-09 DIAGNOSIS — T8781 Dehiscence of amputation stump: Principal | ICD-10-CM | POA: Diagnosis present

## 2015-09-09 DIAGNOSIS — S88112S Complete traumatic amputation at level between knee and ankle, left lower leg, sequela: Secondary | ICD-10-CM | POA: Diagnosis not present

## 2015-09-09 DIAGNOSIS — I739 Peripheral vascular disease, unspecified: Secondary | ICD-10-CM | POA: Diagnosis present

## 2015-09-09 DIAGNOSIS — Z89519 Acquired absence of unspecified leg below knee: Secondary | ICD-10-CM | POA: Diagnosis not present

## 2015-09-09 LAB — CBC WITH DIFFERENTIAL/PLATELET
Basophils Absolute: 0 10*3/uL (ref 0.0–0.1)
Basophils Relative: 0 %
EOS ABS: 0.2 10*3/uL (ref 0.0–0.7)
EOS PCT: 2 %
HCT: 38.2 % (ref 36.0–46.0)
Hemoglobin: 12.2 g/dL (ref 12.0–15.0)
LYMPHS PCT: 21 %
Lymphs Abs: 1.9 10*3/uL (ref 0.7–4.0)
MCH: 27.2 pg (ref 26.0–34.0)
MCHC: 31.9 g/dL (ref 30.0–36.0)
MCV: 85.1 fL (ref 78.0–100.0)
MONO ABS: 0.5 10*3/uL (ref 0.1–1.0)
MONOS PCT: 5 %
NEUTROS ABS: 6.7 10*3/uL (ref 1.7–7.7)
NEUTROS PCT: 72 %
PLATELETS: 256 10*3/uL (ref 150–400)
RBC: 4.49 MIL/uL (ref 3.87–5.11)
RDW: 13.7 % (ref 11.5–15.5)
WBC: 9.3 10*3/uL (ref 4.0–10.5)

## 2015-09-09 LAB — URINALYSIS, ROUTINE W REFLEX MICROSCOPIC
Bilirubin Urine: NEGATIVE
GLUCOSE, UA: NEGATIVE mg/dL
HGB URINE DIPSTICK: NEGATIVE
Ketones, ur: NEGATIVE mg/dL
Leukocytes, UA: NEGATIVE
Nitrite: NEGATIVE
PROTEIN: NEGATIVE mg/dL
Specific Gravity, Urine: 1.019 (ref 1.005–1.030)
pH: 6 (ref 5.0–8.0)

## 2015-09-09 LAB — COMPREHENSIVE METABOLIC PANEL
ALBUMIN: 3.6 g/dL (ref 3.5–5.0)
ALT: 14 U/L (ref 14–54)
ANION GAP: 7 (ref 5–15)
AST: 18 U/L (ref 15–41)
Alkaline Phosphatase: 84 U/L (ref 38–126)
BUN: 17 mg/dL (ref 6–20)
CO2: 25 mmol/L (ref 22–32)
Calcium: 8.9 mg/dL (ref 8.9–10.3)
Chloride: 105 mmol/L (ref 101–111)
Creatinine, Ser: 1.15 mg/dL — ABNORMAL HIGH (ref 0.44–1.00)
GFR calc non Af Amer: 52 mL/min — ABNORMAL LOW (ref >60)
GLUCOSE: 154 mg/dL — AB (ref 65–99)
POTASSIUM: 3.4 mmol/L — AB (ref 3.5–5.1)
SODIUM: 137 mmol/L (ref 135–145)
TOTAL PROTEIN: 7.5 g/dL (ref 6.5–8.1)
Total Bilirubin: 0.5 mg/dL (ref 0.3–1.2)

## 2015-09-09 LAB — GLUCOSE, CAPILLARY: Glucose-Capillary: 122 mg/dL — ABNORMAL HIGH (ref 65–99)

## 2015-09-09 LAB — PROTIME-INR
INR: 1.14 (ref 0.00–1.49)
PROTHROMBIN TIME: 14.8 s (ref 11.6–15.2)

## 2015-09-09 LAB — I-STAT CG4 LACTIC ACID, ED
Lactic Acid, Venous: 1.91 mmol/L (ref 0.5–1.9)
Lactic Acid, Venous: 1.8 mmol/L (ref 0.5–1.9)

## 2015-09-09 MED ORDER — INSULIN ASPART 100 UNIT/ML ~~LOC~~ SOLN: 0.0000 [IU] | Freq: Every day | SUBCUTANEOUS | Status: DC

## 2015-09-09 MED ORDER — ENOXAPARIN SODIUM 40 MG/0.4ML ~~LOC~~ SOLN
40.0000 mg | SUBCUTANEOUS | Status: DC
  Administered 2015-09-09 – 2015-09-14 (×5): 40 mg via SUBCUTANEOUS
  Filled 2015-09-09 (×6): qty 0.4

## 2015-09-09 MED ORDER — VANCOMYCIN HCL IN DEXTROSE 1-5 GM/200ML-% IV SOLN
1000.0000 mg | Freq: Once | INTRAVENOUS | Status: AC
  Administered 2015-09-09: 1000 mg via INTRAVENOUS
  Filled 2015-09-09: qty 200

## 2015-09-09 MED ORDER — ALBUTEROL SULFATE (2.5 MG/3ML) 0.083% IN NEBU: 2.5000 mg | INHALATION_SOLUTION | RESPIRATORY_TRACT | Status: DC | PRN

## 2015-09-09 MED ORDER — VANCOMYCIN HCL 10 G IV SOLR
1250.0000 mg | Freq: Two times a day (BID) | INTRAVENOUS | Status: DC
  Administered 2015-09-10 – 2015-09-12 (×6): 1250 mg via INTRAVENOUS
  Filled 2015-09-09 (×7): qty 1250

## 2015-09-09 MED ORDER — PIPERACILLIN-TAZOBACTAM 3.375 G IVPB 30 MIN
3.3750 g | Freq: Once | INTRAVENOUS | Status: AC
  Administered 2015-09-09: 3.375 g via INTRAVENOUS
  Filled 2015-09-09: qty 50

## 2015-09-09 MED ORDER — SODIUM CHLORIDE 0.9 % IV BOLUS (SEPSIS)
1000.0000 mL | Freq: Once | INTRAVENOUS | Status: AC
  Administered 2015-09-09: 1000 mL via INTRAVENOUS

## 2015-09-09 MED ORDER — IBUPROFEN 400 MG PO TABS
400.0000 mg | ORAL_TABLET | Freq: Four times a day (QID) | ORAL | Status: DC | PRN
  Administered 2015-09-10: 400 mg via ORAL
  Administered 2015-09-11: 600 mg via ORAL
  Filled 2015-09-09 (×4): qty 2

## 2015-09-09 MED ORDER — ALBUTEROL SULFATE HFA 108 (90 BASE) MCG/ACT IN AERS: 2.0000 | INHALATION_SPRAY | RESPIRATORY_TRACT | Status: DC | PRN

## 2015-09-09 MED ORDER — HYDROCODONE-ACETAMINOPHEN 5-325 MG PO TABS
1.0000 | ORAL_TABLET | ORAL | Status: DC | PRN
  Filled 2015-09-09: qty 2

## 2015-09-09 MED ORDER — ASPIRIN EC 81 MG PO TBEC
81.0000 mg | DELAYED_RELEASE_TABLET | Freq: Every day | ORAL | Status: DC
  Administered 2015-09-09 – 2015-09-15 (×6): 81 mg via ORAL
  Filled 2015-09-09 (×6): qty 1

## 2015-09-09 MED ORDER — INSULIN GLARGINE 100 UNIT/ML ~~LOC~~ SOLN
17.0000 [IU] | Freq: Every day | SUBCUTANEOUS | Status: DC
  Administered 2015-09-09 – 2015-09-14 (×5): 17 [IU] via SUBCUTANEOUS
  Filled 2015-09-09 (×7): qty 0.17

## 2015-09-09 MED ORDER — PIPERACILLIN-TAZOBACTAM 3.375 G IVPB
3.3750 g | Freq: Three times a day (TID) | INTRAVENOUS | Status: DC
  Administered 2015-09-09 – 2015-09-13 (×12): 3.375 g via INTRAVENOUS
  Filled 2015-09-09 (×15): qty 50

## 2015-09-09 MED ORDER — INSULIN ASPART 100 UNIT/ML ~~LOC~~ SOLN
0.0000 [IU] | Freq: Three times a day (TID) | SUBCUTANEOUS | Status: DC
  Administered 2015-09-12: 2 [IU] via SUBCUTANEOUS
  Administered 2015-09-13 – 2015-09-14 (×2): 3 [IU] via SUBCUTANEOUS
  Administered 2015-09-15 (×2): 2 [IU] via SUBCUTANEOUS

## 2015-09-09 MED ORDER — AMLODIPINE BESYLATE 10 MG PO TABS
10.0000 mg | ORAL_TABLET | Freq: Every day | ORAL | Status: DC
  Administered 2015-09-10 – 2015-09-15 (×4): 10 mg via ORAL
  Filled 2015-09-09 (×3): qty 1
  Filled 2015-09-09 (×3): qty 2

## 2015-09-09 MED ORDER — BUPRENORPHINE HCL 2 MG SL SUBL
8.0000 mg | SUBLINGUAL_TABLET | Freq: Three times a day (TID) | SUBLINGUAL | Status: DC
  Administered 2015-09-09 – 2015-09-15 (×18): 8 mg via SUBLINGUAL
  Filled 2015-09-09 (×18): qty 4

## 2015-09-09 MED ORDER — PREGABALIN 75 MG PO CAPS
75.0000 mg | ORAL_CAPSULE | Freq: Four times a day (QID) | ORAL | Status: DC
  Administered 2015-09-09 – 2015-09-14 (×13): 75 mg via ORAL
  Filled 2015-09-09 (×18): qty 1

## 2015-09-09 NOTE — Progress Notes (Signed)
Pt ask for pain meds, this RN pulled out norco 5-325 2 tablets, when in room scanning meds, pt sates she only take suboxone for her pain and norco would gaive her withdraw symptoms. Will return norco  To [facility] and notify MD for her requested pain meds.

## 2015-09-09 NOTE — H&P (Signed)
[facility] Admission History and Physical Service Pager: [phone]  Patient name: [name] Medical record number: [id number] Date of birth: 1955/04/27 Age: 60 y.o. Gender: female  Primary Care Provider: Kerin Perna, NP Consultants: none Code Status: Full   Chief Complaint: Left foot pain s/p amputation left foot fifth metatarsal   Assessment and Plan: [name] is a 60 y.o. female presenting with pain and drainage from her left foot s/p fifth metatarsal amputation on (6/30). 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  #Left foot pain s/p left foot fifth metatarsal raw amputation on 6/30 Patient with a significant history of insensate diabetic neuropathy who recently underwent left fifth metatarsal amputation 2/2 osteomyelitis from diabetic ulcer. Patient had surgery on 6/30 and was discharged on 7/1. Patient has had dressing changes twice daily done by husband, and home health nurse once a week. Increase drainage, malodorous wound, foot pain, and erythema are suspicious for wound infection vs ostemolyelitis vs sepsis. Patient denies any fever, but endorses some chills. Patient with 2/4 SIRS (tachycardia and fever) on arrival to ED. Lactic acid 1.91, trended down to 1.8. No leukocytosis. Blood and urine culture obtained. Wound on exam seem to be healing appropriately ,stitches are visible, patient demonstrate minimal tenderness with some manipulation of her foot, making osteomyelitis less likely. In addition x ray showed no signs of osteomyletis. There was no pus drainage. Patient did not look toxic, lowering sepsis on our differential list. Doubt DVT with biilateral leg swelling or without calf tenderness. She could have venous insufficiency. Patient is admitted for IV antibiotic recommendation by orthopedics per ED provider. Orthopedics to see the patient in the morning --Admit to FMTS.  Attending Dr. McDcdiarmd --CXR, Left foot xray --Vancomycin and zosyn for broad coverage.  --Follow-up blood and urine cultures (collected in ED) --A.m. CBC, BMP --CRP --Consider left foot MRI  --Follow up with ortho surgery for I&D vs medical management  #DM type II/Diabetic neuropathy: on Lantus 34 units at home, seemed to be well controlled. Last A1c was 6.0 on 6/30. Patient with an significant history of neuropathy --Lantus 17 units while inpatient  --SSI --CBG qACHS --Continue Pregabalin 75 mg  #CKD III, at baseline: Creatinine 1.15 (at baseline). Status post 3 L normal saline -BMP in the morning  #Essential Hypertension: On Amlodipine 3m once daily -Continue home amlodipine  #Asthma: Stable --Continue albuterol q4 prn  #Chronic Pain 2/2 knee and back pain: On Suboxone 8-2 milligrams --Will continue buprenorphine 857mper pharmacy's recommendation  FEN/GI:  -Carb modified -Saline lock  Prophylaxis: Lovenox  Disposition: inpatient observation pending assessment by orthopedics   History of Present Illness:  [name] is a [age].o. 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 Patient was discharged on 7/1, since her discharged patient have been changing dressing on left foot with the aid of her husband twice a day and has also had a home health nurse stop by once a week. Patient started to notice increase drainage for the past two days. Patient also started experiencing left foot pain on Thursday. Patient admit to ambulating and putting weight on her leg though she claimed she was using a cane. Patient states that increase pain and  drainage prompted a visit to her orthopedic office who recommended admission for further assessment of her left foot  wound. Patient was seen in the ED friday evening was given pain medications, but according to patient was told hospital did not have any bed for admission and was discharge home. Patient returned today with increasing pain for admission pending further evaluation by orthopedic surgery. Patient endorsed some chills and diarrhea and for the past two days. patient denies fever, chest pain, nausea, dizziness. Of note patient has a voice modulator s/p total laryngectomy. In the ED, patient recevied 2L IVF bolus, was started on Vancomycin and Zosyn, and had a chest and foot xray which show evidence of recent foot surgery consistent with tissue inflammation.  Review Of Systems: Per HPI with the following otherwise the remainder of the systems were negative.  Patient Active Problem List   Diagnosis Date Noted  . Sepsis ([place]) 09/09/2015  . Wound infection ([place]) 09/09/2015  . 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 Osteomyelitis of foot, acute, left 08/25/2015  . Diabetic foot infection ([place]) 04/19/2015  . Diabetic foot ulcer ([place]) 04/19/2015  . CKD (chronic kidney disease) stage 3, GFR 30-59 ml/min 04/19/2015  . Head and neck cancer ([place]) 04/19/2015  . Right hip pain 04/19/2015  . Infection of supraglottis (throat) 09/16/2014  . Incidental lung nodule, less than or equal to 26m 09/16/2014  . Obstructive uropathy 03/22/2014  . COPD (chronic obstructive pulmonary disease) ([place] 03/20/2014  . Controlled type 2 diabetes mellitus with diabetic nephropathy ([place] 03/20/2014  . CAP (community acquired pneumonia)   . Lice infested hair [id number] . Tobacco abuse 08/05/2012  . TINEA CORPORIS 09/01/2009  . DEPRESSION 08/11/2009  . COPD 08/11/2009  . Chronic pain syndrome 04/15/2007  . HYPERLIPIDEMIA NEC/NOS 11/02/2006  . PERIPHERAL VASCULAR DISEASE 11/02/2006  . MORBID OBESITY 10/28/2006  . Obstructive sleep apnea 10/28/2006  . POLYNEUROPATHY 10/28/2006  . Essential  hypertension 10/28/2006  . OSTEOARTHRITIS, KNEES, BILATERAL 10/28/2006  . PROTEINURIA 11/25/2005    Past Medical History: Past Medical History  Diagnosis Date  . Hypertension   . COPD (chronic obstructive pulmonary disease) ([place]   . Shortness of breath   . Arthritis   . Diabetic neuropathy ([place]   . Rash, skin     face  . Kidney stone   . Sleep apnea     has not used cpap past 10 yrs  . Nocturia   . Urge incontinence   . Morbid obesity with BMI of 40.0-44.9, adult ([place]   . Renal insufficiency   . Diabetes mellitus without complication ([place]     INSULIN DEPENDENT  . Neuropathy ([place]   . Head and neck cancer ([place] 04/19/2015  . Pneumonia   . Depression   . Anxiety   . Fibromyalgia   . Difficult intubation     total laryngectomy    Past Surgical History: Past Surgical History  Procedure Laterality Date  . Cholecystectomy    . Tubal ligation    . Carpel tunnel    . Cystoscopy w/ ureteral stent placement Left 03/22/2014    Procedure: CYSTOSCOPY WITH RETROGRADE PYELOGRAM/URETERAL STENT PLACEMENT;  Surgeon: TAlexis Frock MD;  [facility]  Service: Urology;  Laterality: Left;  . Breast surgery  1982    LUMPECTOMY / benign  . Salivary stone removal    . Cystoscopy with retrograde pyelogram, ureteroscopy and stent placement Left 05/11/2014    Procedure: CYSTOSCOPY WITH RETROGRADE PYELOGRAM, URETEROSCOPY AND STENT  PLACEMENT;  Surgeon: Alexis Frock, MD;  Location: [facility];  Service: Urology;  Laterality: Left;  . Holmium laser application Left 07/05/209    Procedure: HOLMIUM LASER APPLICATION;  Surgeon: Alexis Frock, MD;  Location: [facility];  Service: Urology;  Laterality: Left;  . Brain surgery      vertebral aneurysm  . Tonsillectomy    . Laryngectomy      total ( stoma)  . Amputation Left 08/25/2015    Procedure: 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;  Surgeon: Newt Minion, MD;  Location: [facility];  Service: Orthopedics;  Laterality: Left;    Social History: Social  History  Substance Use Topics  . Smoking status: Former Smoker -- 1.00 packs/day for 40 years    Types: Cigarettes  . Smokeless tobacco: Never Used     Comment: quit march 2017  . Alcohol Use: No    Family History: Family History  Problem Relation Age of Onset  . Adopted: Yes    Allergies and Medications: Allergies  Allergen Reactions  . Ace Inhibitors Anaphylaxis and Swelling    Angioedema  . Zestril [Lisinopril] Anaphylaxis and Swelling    Angioedema   No current facility-administered medications on file prior to encounter.   Current Outpatient Prescriptions on File Prior to Encounter  Medication Sig Dispense Refill  . albuterol (PROVENTIL HFA;VENTOLIN HFA) 108 (90 BASE) MCG/ACT inhaler Inhale 2 puffs into the lungs every 4 (four) hours as needed for wheezing or shortness of breath. 18 g 1  . amLODipine (NORVASC) 10 MG tablet Take 1 tablet (10 mg total) by mouth daily. 30 tablet 0  . blood glucose meter kit and supplies KIT Dispense based on patient and insurance preference. Use up to four times daily as directed. (FOR ICD-9 250.00, 250.01). 1 each 0  . Buprenorphine HCl-Naloxone HCl (SUBOXONE) 8-2 MG FILM Place 1 Film under the tongue 3 (three) times daily.     Marland Kitchen docusate sodium (COLACE) 100 MG capsule Take 100 mg by mouth daily as needed for mild constipation.    Marland Kitchen ibuprofen (ADVIL,MOTRIN) 200 MG tablet Take 400-600 mg by mouth every 6 (six) hours as needed for fever, headache or moderate pain.     Marland Kitchen insulin aspart (NOVOLOG FLEXPEN) 100 UNIT/ML FlexPen Inject 3 Units into the skin 3 (three) times daily with meals.    . Insulin Glargine (LANTUS SOLOSTAR) 100 UNIT/ML Solostar Pen Inject 34 Units into the skin daily. At 10AM (Patient taking differently: Inject 30 Units into the skin every morning. At 10AM) 15 mL 3  . Insulin Pen Needle (PEN NEEDLES 3/16") 31G X 5 MM MISC Use as directed. 100 each 3  . Multiple Vitamin (MULTIVITAMIN) tablet Take 1 tablet by mouth daily with  supper.     . pregabalin (LYRICA) 75 MG capsule Take 75 mg by mouth 4 (four) times daily.      Objective: BP 131/60 mmHg  Pulse 89  Temp(Src) 99.6 F (37.6 C) (Oral)  Resp 16  SpO2 100% Exam: General: obese female sitting on side of bed; pleasant, in NAD Eyes: PERRLA, EOMI, no scleral icterus ENTM: MMM, no oropharyngeal erythema or exudates Neck: normal ROM, no JVD, supple, tracheotomy valve Cardiovascular: RRR, normal S1 and S2, no murmurs, rubs or gallops appreciated Respiratory: CTAB, normal WOB on RA  Abdomen: non distended, non-tender, +BS MSK: Bilateral non pitting edema; left foot swelling s/p left fifth metatarsal amputation, with some drainage and mild erythema    Skin:  Lower extremities bilateral hyperpigmentation 2/2 to  venous congestion  Neuro: A&Ox3, no focal deficits Psych: appropriate mood and affect    Labs and Imaging:  Results for orders placed or performed during the hospital encounter of 09/09/15 (from the past 24 hour(s))  Comprehensive metabolic panel     Status: Abnormal   Collection Time: 09/09/15  1:25 PM  Result Value Ref Range   Sodium 137 135 - 145 mmol/L   Potassium 3.4 (L) 3.5 - 5.1 mmol/L   Chloride 105 101 - 111 mmol/L   CO2 25 22 - 32 mmol/L   Glucose, Bld 154 (H) 65 - 99 mg/dL   BUN 17 6 - 20 mg/dL   Creatinine, Ser 1.15 (H) 0.44 - 1.00 mg/dL   Calcium 8.9 8.9 - 10.3 mg/dL   Total Protein 7.5 6.5 - 8.1 g/dL   Albumin 3.6 3.5 - 5.0 g/dL   AST 18 15 - 41 U/L   ALT 14 14 - 54 U/L   Alkaline Phosphatase 84 38 - 126 U/L   Total Bilirubin 0.5 0.3 - 1.2 mg/dL   GFR calc non Af Amer 52 (L) >60 mL/min   GFR calc Af Amer >60 >60 mL/min   Anion gap 7 5 - 15  CBC WITH DIFFERENTIAL     Status: None   Collection Time: 09/09/15  1:25 PM  Result Value Ref Range   WBC 9.3 4.0 - 10.5 K/uL   RBC 4.49 3.87 - 5.11 MIL/uL   Hemoglobin 12.2 12.0 - 15.0 g/dL   HCT 38.2 36.0 - 46.0 %   MCV 85.1 78.0 - 100.0 fL   MCH 27.2 26.0 - 34.0 pg   MCHC  31.9 30.0 - 36.0 g/dL   RDW 13.7 11.5 - 15.5 %   Platelets 256 150 - 400 K/uL   Neutrophils Relative % 72 %   Neutro Abs 6.7 1.7 - 7.7 K/uL   Lymphocytes Relative 21 %   Lymphs Abs 1.9 0.7 - 4.0 K/uL   Monocytes Relative 5 %   Monocytes Absolute 0.5 0.1 - 1.0 K/uL   Eosinophils Relative 2 %   Eosinophils Absolute 0.2 0.0 - 0.7 K/uL   Basophils Relative 0 %   Basophils Absolute 0.0 0.0 - 0.1 K/uL  I-Stat CG4 Lactic Acid, ED  (not at  [facility])     Status: Abnormal   Collection Time: 09/09/15  1:47 PM  Result Value Ref Range   Lactic Acid, Venous 1.91 (HH) 0.5 - 1.9 mmol/L  Urinalysis, Routine w reflex microscopic (not at [facility])     Status: None   Collection Time: 09/09/15  2:27 PM  Result Value Ref Range   Color, Urine YELLOW YELLOW   APPearance CLEAR CLEAR   Specific Gravity, Urine 1.019 1.005 - 1.030   pH 6.0 5.0 - 8.0   Glucose, UA NEGATIVE NEGATIVE mg/dL   Hgb urine dipstick NEGATIVE NEGATIVE   Bilirubin Urine NEGATIVE NEGATIVE   Ketones, ur NEGATIVE NEGATIVE mg/dL   Protein, ur NEGATIVE NEGATIVE mg/dL   Nitrite NEGATIVE NEGATIVE   Leukocytes, UA NEGATIVE NEGATIVE  I-Stat CG4 Lactic Acid, ED  (not at  [facility])     Status: None   Collection Time: 09/09/15  4:01 PM  Result Value Ref Range   Lactic Acid, Venous 1.80 0.5 - 1.9 mmol/L  Protime-INR     Status: None   Collection Time: 09/09/15  6:30 PM  Result Value Ref Range   Prothrombin Time 14.8 11.6 - 15.2 seconds   INR 1.14 0.00 - 1.49  DG Foot Complete Left  Status post interval surgical amputation of fifth metatarsal and phalanges with overlying soft tissue gas and swelling most consistent with expected postoperative findings, but acute inflammation or infection cannot be excluded. No definite evidence of osteomyelitis is seen at this time.  DG Chest Port 1  The heart size and mediastinal contours are within normal limits. Both lungs are clear. No pneumothorax or pleural effusion is noted. The visualized skeletal  structures are unremarkable.   Marjie Skiff, MD 09/09/2015, 6:26 PM PGY-1, [place] [facility] pager: [phone], text pages welcome

## 2015-09-09 NOTE — ED Provider Notes (Signed)
CSN: [id number]     Arrival date & time 09/09/15  1207 History   First MD Initiated Contact with Patient 09/09/15 1223     Chief Complaint  Patient presents with  . Wound Infection     (Consider location/radiation/quality/duration/timing/severity/associated sxs/prior Treatment) HPI Comments: [name] is a 60 y.o. Female with history of HLD, morbid obesity, HTN, COPD, PAD, T2 DM with diabetic neuropathy, diabetic foot ulcer, CKD stage III presents to ED with complaint of wound infection. 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 Sharol Given. Approximately 2 days ago patient noted pain, swelling, purulent discharge from wound site. Patient called outpatient office and was told to come to the emergency department for IV antibiotics and admission. Patient has associated fever, chills, and nausea. She has chronic shortness of breath. Denies chest pain. Denies abdominal pain, constipation, vomiting, dysuria, hematuria. She had diarrhea approximately 2 days ago that has since resolved.  The history is provided by the patient and a relative.    Past Medical History  Diagnosis Date  . Hypertension   . COPD (chronic obstructive pulmonary disease) ([place])   . Shortness of breath   . Arthritis   . Diabetic neuropathy ([place])   . Rash, skin     face  . Kidney stone   . Sleep apnea     has not used cpap past 10 yrs  . Nocturia   . Urge incontinence   . Morbid obesity with BMI of 40.0-44.9, adult ([place])   . Renal insufficiency   . Diabetes mellitus without complication ([place])     INSULIN DEPENDENT  . Neuropathy ([place])   . Head and neck cancer ([place]) 04/19/2015  . Pneumonia   . Depression   . Anxiety   . Fibromyalgia   . Difficult intubation     total laryngectomy   Past Surgical History  Procedure Laterality Date  . Cholecystectomy    . Tubal ligation    . Carpel tunnel    . Cystoscopy w/ ureteral stent placement Left  03/22/2014    Procedure: CYSTOSCOPY WITH RETROGRADE PYELOGRAM/URETERAL STENT PLACEMENT;  Surgeon: Alexis Frock, MD;  Location: [facility];  Service: Urology;  Laterality: Left;  . Breast surgery  1982    LUMPECTOMY / benign  . Salivary stone removal    . Cystoscopy with retrograde pyelogram, ureteroscopy and stent placement Left 05/11/2014    Procedure: [place], URETEROSCOPY AND STENT PLACEMENT;  Surgeon: Alexis Frock, MD;  Location: [facility];  Service: Urology;  Laterality: Left;  . Holmium laser application Left 3/81/7711    Procedure: HOLMIUM LASER APPLICATION;  Surgeon: Alexis Frock, MD;  Location: [facility];  Service: Urology;  Laterality: Left;  . Brain surgery      vertebral aneurysm  . Tonsillectomy    . Laryngectomy      total ( stoma)  . Amputation Left 08/25/2015    Procedure: 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;  Surgeon: Newt Minion, MD;  Location: [facility];  Service: Orthopedics;  Laterality: Left;   Family History  Problem Relation Age of Onset  . Adopted: Yes   Social History  Substance Use Topics  . Smoking status: Former Smoker -- 1.00 packs/day for 40 years    Types: Cigarettes  . Smokeless tobacco: Never Used     Comment: quit march 2017  . Alcohol Use: No   OB History    No data available     Review of  Systems  Constitutional: Positive for fever ( subjective, hasn't checked temperature and takes ibuprofen) and chills.  HENT: Negative for sore throat.        Stoma placed  Eyes: Negative for visual disturbance.  Respiratory: Positive for shortness of breath ( chronic).   Gastrointestinal: Positive for nausea and diarrhea ( 2 days ago, resolved). Negative for vomiting, abdominal pain and constipation.  Genitourinary: Negative for dysuria and hematuria.  Musculoskeletal: Positive for joint swelling ( left foot) and arthralgias ( chronic generalized, acute left foot).  Skin: Positive for color change and wound.  Allergic/Immunologic: Positive  for immunocompromised state ( diabetes).  Neurological: Positive for numbness ( chronic neuropathy). Negative for dizziness, light-headedness and headaches.      Allergies  Ace inhibitors and Zestril  Home Medications   Prior to Admission medications   Medication Sig Start Date End Date Taking? Authorizing Provider  albuterol (PROVENTIL HFA;VENTOLIN HFA) 108 (90 BASE) MCG/ACT inhaler Inhale 2 puffs into the lungs every 4 (four) hours as needed for wheezing or shortness of breath. 08/10/12   Hosie Poisson, MD  amLODipine (NORVASC) 10 MG tablet Take 1 tablet (10 mg total) by mouth daily. 03/31/15   Everlene Balls, MD  blood glucose meter kit and supplies KIT Dispense based on patient and insurance preference. Use up to four times daily as directed. (FOR ICD-9 250.00, 250.01). 03/24/14   Bonnielee Haff, MD  Buprenorphine HCl-Naloxone HCl (SUBOXONE) 8-2 MG FILM Place 1 Film under the tongue 3 (three) times daily.     Historical Provider, MD  collagenase (SANTYL) ointment Apply topically daily. Apply to left foot ulcers plus dry dressing changes daily. Patient taking differently: Apply 1 application topically daily. Apply to left foot ulcers plus dry dressing changes daily. 04/20/15   Modena Jansky, MD  docusate sodium (COLACE) 100 MG capsule Take 100 mg by mouth daily as needed for mild constipation.    Historical Provider, MD  ibuprofen (ADVIL,MOTRIN) 200 MG tablet Take 400-600 mg by mouth every 6 (six) hours as needed for fever, headache or moderate pain.     Historical Provider, MD  insulin aspart (NOVOLOG FLEXPEN) 100 UNIT/ML FlexPen Inject 3 Units into the skin 3 (three) times daily with meals.    Historical Provider, MD  Insulin Glargine (LANTUS SOLOSTAR) 100 UNIT/ML Solostar Pen Inject 34 Units into the skin daily. At 10AM Patient taking differently: Inject 30 Units into the skin every morning. At 10AM 03/24/14   Bonnielee Haff, MD  Insulin Pen Needle (PEN NEEDLES 3/16") 31G X 5 MM MISC Use as  directed. 03/24/14   Bonnielee Haff, MD  Multiple Vitamin (MULTIVITAMIN) tablet Take 1 tablet by mouth daily.    Historical Provider, MD  oxyCODONE-acetaminophen (ROXICET) 5-325 MG tablet Take 1 tablet by mouth every 4 (four) hours as needed for severe pain. 08/25/15   Newt Minion, MD  pregabalin (LYRICA) 75 MG capsule Take 75 mg by mouth 4 (four) times daily.    Historical Provider, MD   BP 143/79 mmHg  Pulse 105  Temp(Src) 101.1 F (38.4 C) (Oral)  Resp 19  SpO2 98% Physical Exam  Constitutional: She appears well-developed and well-nourished. No distress.  HENT:  Head: Normocephalic and atraumatic.  Mouth/Throat: Oropharynx is clear and moist. No oropharyngeal exudate.  Eyes: Conjunctivae and EOM are normal. Pupils are equal, round, and reactive to light. Right eye exhibits no discharge. Left eye exhibits no discharge. No scleral icterus.  Neck: Normal range of motion. Neck supple.  Cardiovascular: Normal rate,  regular rhythm, normal heart sounds and intact distal pulses.   No murmur heard. Pulmonary/Chest: Effort normal and breath sounds normal. No respiratory distress.  Abdominal: Soft. Bowel sounds are normal. There is no tenderness. There is no rebound and no guarding.  Lymphadenopathy:    She has no cervical adenopathy.  Neurological: She is alert.  Skin: Skin is warm and dry. She is not diaphoretic.  Wound dehiscence on left lateral foot at site of partial amputation. Warmth, erythema, swelling appreciated across dorsal aspect of left foot. Purulent discharge noted at incision site. Malodorous.    Psychiatric: She has a normal mood and affect. Her behavior is normal.    ED Course  Procedures (including critical care time) Labs Review Labs Reviewed  COMPREHENSIVE METABOLIC PANEL - Abnormal; Notable for the following:    Potassium 3.4 (*)    Glucose, Bld 154 (*)    Creatinine, Ser 1.15 (*)    GFR calc non Af Amer 52 (*)    All other components within normal limits   I-STAT CG4 LACTIC ACID, ED - Abnormal; Notable for the following:    Lactic Acid, Venous 1.91 (*)    All other components within normal limits  CULTURE, BLOOD (ROUTINE X 2)  CULTURE, BLOOD (ROUTINE X 2)  URINE CULTURE  CBC WITH DIFFERENTIAL/PLATELET  URINALYSIS, ROUTINE W REFLEX MICROSCOPIC (NOT AT [facility])  I-STAT CG4 LACTIC ACID, ED    Imaging Review Dg Chest Port 1 View  09/09/2015  CLINICAL DATA:  Shortness of breath. EXAM: PORTABLE CHEST 1 VIEW COMPARISON:  Radiographs of March 02, 2015. FINDINGS: The heart size and mediastinal contours are within normal limits. Both lungs are clear. No pneumothorax or pleural effusion is noted. The visualized skeletal structures are unremarkable. IMPRESSION: No acute cardiopulmonary abnormality seen. Electronically Signed   By: Marijo Conception, M.D.   On: 09/09/2015 14:09   Dg Foot Complete Left  09/09/2015  CLINICAL DATA:  Recent surgical amputation of fifth toe. EXAM: LEFT FOOT - COMPLETE 3+ VIEW COMPARISON:  Radiograph August 21, 2015. FINDINGS: Status post interval surgical amputation of the fifth metatarsal and phalanges. Old healed fourth metatarsal fracture is noted. Soft tissue gas and swelling is seen overlying amputation site most consistent with postoperative findings, but inflammation or infection cannot be excluded. No lytic destruction is seen at this time to suggest acute osteomyelitis. IMPRESSION: Status post interval surgical amputation of fifth metatarsal and phalanges with overlying soft tissue gas and swelling most consistent with expected postoperative findings, but acute inflammation or infection cannot be excluded. No definite evidence of osteomyelitis is seen at this time. Electronically Signed   By: Marijo Conception, M.D.   On: 09/09/2015 14:15   I have personally reviewed and evaluated these images and lab results as part of my medical decision-making.   EKG Interpretation None     Filed Vitals:   09/09/15 1408 09/09/15 1435  09/09/15 1445 09/09/15 1515  BP:  136/67 130/80 140/74  Pulse:  93 94 92  Temp: 99.7 F (37.6 C)     TempSrc: Oral     Resp:  _0 SpO2:  99% 98% 100%    MDM   Final diagnoses:  Sepsis, due to unspecified organism [facility])   Patient is febrile and tachycardic, vital signs otherwise stable. Physical exam remarkable for wound dehiscence with purulent discharge, erythema, swelling, warmth, and malodorous. Code sepsis initiated. IV fluids and broad-spectrum antibiotics started. X-ray of left foot to rule out osteo. Will consult  ortho concerning possible need for OR debridement.  Lactic acid mildly elevated at 1.91. Mild increase in creatinine; however, similar to previous labs. CBC unremarkable. U/A unremarkable. CXR shows no acute cardiopulmonary. EKG shows NSR with incomplete RBBB. X-ray of foot questionable for post operative changes vs acute infection.  3:51 PM: Consult to Dr. Lorin Mercy, appreciate his time and input. Recommend admission, IV ABX, will see in am. Consult placed to hospitalist.  4:07 PM: Spoke with hospitalist, appreciated their time and input. Will admit for further evaluation and management of sepsis in setting of possible wound infection.   Roxanna Mew, PA-C 09/09/15 [place], MD 09/09/15 [phone]

## 2015-09-09 NOTE — Progress Notes (Signed)
Pharmacy Antibiotic Note  [name] is a 60 y.o. female admitted on 09/09/2015 with wound infxn.  Pharmacy has been consulted for vancomycin and zosyn dosing. Tmax is 101.1 and WBC is WNL. Scr is 1.15 and lactic acid is 1.8.   Plan: - Vanc '1250mg'$  IV Q12H - Zosyn 3.375gm IV Q8H (4 hr inf) - F/u renal fxn, C&S, clinical status and trough at SS     Temp (24hrs), Avg:100.1 F (37.8 C), Min:99.6 F (37.6 C), Max:101.1 F (38.4 C)   Recent Labs Lab 09/08/15 1704 09/08/15 1759 09/09/15 1325 09/09/15 1347 09/09/15 1601  WBC 8.3  --  9.3  --   --   CREATININE 1.32*  --  1.15*  --   --   LATICACIDVEN  --  1.77  --  1.91* 1.80    CrCl cannot be calculated (Unknown ideal weight.).    Allergies  Allergen Reactions  . Ace Inhibitors Anaphylaxis and Swelling    Angioedema  . Zestril [Lisinopril] Anaphylaxis and Swelling    Angioedema    Antimicrobials this admission: Vanc 7/15>> Zosyn 7/15>>  Dose adjustments this admission: N/A  Microbiology results: Pending  Thank you for allowing pharmacy to be a part of this patient's care.  Trigger Frasier, Rande Lawman 09/09/2015 5:12 PM

## 2015-09-09 NOTE — ED Notes (Signed)
Report attempted 

## 2015-09-09 NOTE — ED Notes (Signed)
Pt reports recent partial amputation to left foot. Pt reports wound is infected and sent here for iv antibiotics.

## 2015-09-10 ENCOUNTER — Inpatient Hospital Stay (HOSPITAL_COMMUNITY): Payer: Medicaid Other

## 2015-09-10 DIAGNOSIS — Z794 Long term (current) use of insulin: Secondary | ICD-10-CM

## 2015-09-10 LAB — C-REACTIVE PROTEIN: CRP: 7.7 mg/dL — ABNORMAL HIGH (ref <1.0)

## 2015-09-10 LAB — CBC
HCT: 35.1 % — ABNORMAL LOW (ref 36.0–46.0)
Hemoglobin: 11 g/dL — ABNORMAL LOW (ref 12.0–15.0)
MCH: 26.8 pg (ref 26.0–34.0)
MCHC: 31.3 g/dL (ref 30.0–36.0)
MCV: 85.4 fL (ref 78.0–100.0)
PLATELETS: 219 10*3/uL (ref 150–400)
RBC: 4.11 MIL/uL (ref 3.87–5.11)
RDW: 14 % (ref 11.5–15.5)
WBC: 6.9 10*3/uL (ref 4.0–10.5)

## 2015-09-10 LAB — GLUCOSE, CAPILLARY
GLUCOSE-CAPILLARY: 113 mg/dL — AB (ref 65–99)
GLUCOSE-CAPILLARY: 129 mg/dL — AB (ref 65–99)
Glucose-Capillary: 120 mg/dL — ABNORMAL HIGH (ref 65–99)
Glucose-Capillary: 98 mg/dL (ref 65–99)

## 2015-09-10 LAB — LIPID PANEL
Cholesterol: 176 mg/dL (ref 0–200)
HDL: 59 mg/dL (ref >40)
LDL CALC: 100 mg/dL — AB (ref 0–99)
TRIGLYCERIDES: 85 mg/dL (ref <150)
Total CHOL/HDL Ratio: 3 RATIO
VLDL: 17 mg/dL (ref 0–40)

## 2015-09-10 LAB — BASIC METABOLIC PANEL
Anion gap: 5 (ref 5–15)
BUN: 15 mg/dL (ref 6–20)
CALCIUM: 8.5 mg/dL — AB (ref 8.9–10.3)
CHLORIDE: 109 mmol/L (ref 101–111)
CO2: 27 mmol/L (ref 22–32)
CREATININE: 1.16 mg/dL — AB (ref 0.44–1.00)
GFR calc Af Amer: 59 mL/min — ABNORMAL LOW (ref >60)
GFR, EST NON AFRICAN AMERICAN: 51 mL/min — AB (ref >60)
Glucose, Bld: 118 mg/dL — ABNORMAL HIGH (ref 65–99)
Potassium: 3.8 mmol/L (ref 3.5–5.1)
SODIUM: 141 mmol/L (ref 135–145)

## 2015-09-10 LAB — URINE CULTURE

## 2015-09-10 NOTE — Progress Notes (Signed)
Family Medicine Teaching Service Daily Progress Note Intern Pager: [phone]  Patient name: [name] Medical record number: [id number] Date of birth: December 20, 1955 Age: 60 y.o. Gender: female  Primary Care Provider: Kerin Perna, NP Consultants: orthopedics Code Status: full  Pt Overview and Major Events to Date:  7/15: admitted for IV antibiotic for diabetic foot ulcer drainage  Assessment and Plan: [name] is a 60 y.o. female presenting with pain and drainage from her left foot s/p fifth metatarsal amputation on (6/30). 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  Left foot pain s/p left foot fifth metatarsal raw amputation on 6/30: met sepsis criteria with 2/4 SIRS in ED. Sepsis resolved. CRP elevated to 7.7. Continues to have no leukocytosis. No complaint this morning. -Continue Vancomycin and zosyn (7/15>) -Follow-up blood and urine cultures (collected in ED) -Follow ortho recs: hasn't patient but didn't drop note  DM type II/Diabetic neuropathy: CBG 120 this morning. on Lantus 34 units at home, seemed to be well controlled. Last A1c was 6.0 on 6/30.  -Lantus 17 units + SSI -CBG qACHS -Continue Pregabalin 75 mg for neuropathy -Lipid panel. At risk for ASCVD.  CKD III, at baseline: Creatinine 1.16 (at baseline). Status post 3 L normal saline in ED -BMP as needed  Essential Hypertension: normotensive. -Continue home amlodipine  Asthma: Stable -Continue albuterol q4 prn  Chronic Pain 2/2 knee and back pain: On Suboxone 8-2 milligrams -Will continue buprenorphine 71m per pharmacy's recommendation  FEN/GI:  -Carb modified -Saline lock  Prophylaxis: Lovenox  Disposition: inpatient observation pending assessment by orthopedics   Subjective:  No complaint this morning. She says Dr. YLorin Mercyfrom ortho saw her yesterday and told her that she may be her for a couple of days for   Objective: Temp:   [97.3 F (36.3 C)-101.1 F (38.4 C)] 97.3 F (36.3 C) (07/16 0606) Pulse Rate:  [62-107] 62 (07/16 0606) Resp:  [13-21] 16 (07/15 1755) BP: (117-149)/(60-99) 122/61 mmHg (07/16 0606) SpO2:  [98 %-100 %] 100 % (07/16 0606) Physical Exam: General: obese female sitting on side of bed; pleasant, in NAD Eyes: PERRLA, EOMI, no scleral icterus ENTM: MMM, no oropharyngeal erythema or exudates Neck: normal ROM, no JVD, supple, tracheotomy valve Cardiovascular: RRR, normal S1 and S2, no murmurs, rubs or gallops appreciated Respiratory: CTAB, normal WOB on RA  Abdomen: non distended, non-tender, +BS MSK: bilateral non pitting edema; left foot swelling s/p left fifth metatarsal amputation, with some drainage and mild erythema Skin: Lower extremities bilateral hyperpigmentation 2/2 to venous congestion  Neuro: A&Ox3, no focal deficits Psych: appropriate mood and affect  Laboratory:  Recent Labs Lab 09/08/15 1704 09/09/15 1325 09/10/15 0332  WBC 8.3 9.3 6.9  HGB 12.1 12.2 11.0*  HCT 37.7 38.2 35.1*  PLT 265 256 219    Recent Labs Lab 09/08/15 1704 09/09/15 1325 09/10/15 0332  NA 139 137 141  K 3.5 3.4* 3.8  CL 103 105 109  CO2 28 25 27   BUN 20 17 15   CREATININE 1.32* 1.15* 1.16*  CALCIUM 9.3 8.9 8.5*  PROT 6.9 7.5  --   BILITOT 0.4 0.5  --   ALKPHOS 89 84  --   ALT 15 14  --   AST 17 18  --   GLUCOSE 115* 154* 118*    Imaging/Diagnostic Tests: Dg Chest Port 1 View  09/09/2015   IMPRESSION: No acute cardiopulmonary abnormality seen. Electronically Signed  By: Marijo Conception, M.D.   On: 09/09/2015 14:09   Dg Foot Complete Left  09/09/2015  IMPRESSION: Status post interval surgical amputation of fifth metatarsal and phalanges with overlying soft tissue gas and swelling most consistent with expected postoperative findings, but acute inflammation or infection cannot be excluded. No definite evidence of osteomyelitis is seen at this time. Electronically Signed   By: Marijo Conception, M.D.   On: 09/09/2015 14:15    Chelsea Riding, MD 09/10/2015, 6:25 AM PGY-2, [place] Intern pager: [phone], text pages welcome

## 2015-09-10 NOTE — Progress Notes (Signed)
Patient ID: [name], female   DOB: 23-Aug-1955, 60 y.o.   MRN: [id number] Foot less painful less swelling. Will order MRI to decide on need for more prox amputation. If MRI shows osteo then will post for surgery Monday. Hopefully can get MRI today.

## 2015-09-10 NOTE — Consult Note (Signed)
Reason for Consult:post op draining left foot with partial wound separation Referring Physician: Mcdiarmid MD  [name] is an 60 y.o. female.  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 Sharol Given on 08/25/15.  She was suppose to be admitted Friday after being seen in our office but instead went out and did errands and did not go to the hospital. Has been walking on foot since she is too weak to NWB.  xrays no osteo. MRI just done shows questional areas and some post op fluid.  Pain better on IV ABX and foot has improved since yesterday.    Past Medical History  Diagnosis Date  . Hypertension   . COPD (chronic obstructive pulmonary disease) ([place])   . Shortness of breath   . Arthritis   . Diabetic neuropathy ([place])   . Rash, skin     face  . Kidney stone   . Sleep apnea     has not used cpap past 10 yrs  . Nocturia   . Urge incontinence   . Morbid obesity with BMI of 40.0-44.9, adult ([place])   . Renal insufficiency   . Diabetes mellitus without complication ([place])     INSULIN DEPENDENT  . Neuropathy ([place])   . Head and neck cancer ([place]) 04/19/2015  . Pneumonia   . Depression   . Anxiety   . Fibromyalgia   . Difficult intubation     total laryngectomy    Past Surgical History  Procedure Laterality Date  . Cholecystectomy    . Tubal ligation    . Carpel tunnel    . Cystoscopy w/ ureteral stent placement Left 03/22/2014    Procedure: CYSTOSCOPY WITH RETROGRADE PYELOGRAM/URETERAL STENT PLACEMENT;  Surgeon: Alexis Frock, MD;  Location: [facility];  Service: Urology;  Laterality: Left;  . Breast surgery  1982    LUMPECTOMY / benign  . Salivary stone removal    . Cystoscopy with retrograde pyelogram, ureteroscopy and stent placement Left 05/11/2014    Procedure: [place], URETEROSCOPY AND STENT PLACEMENT;  Surgeon: Alexis Frock, MD;  Location: [facility];  Service:  Urology;  Laterality: Left;  . Holmium laser application Left 2/69/4854    Procedure: HOLMIUM LASER APPLICATION;  Surgeon: Alexis Frock, MD;  Location: [facility];  Service: Urology;  Laterality: Left;  . Brain surgery      vertebral aneurysm  . Tonsillectomy    . Laryngectomy      total ( stoma)  . Amputation Left 08/25/2015    Procedure: 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;  Surgeon: Newt Minion, MD;  Location: [facility];  Service: Orthopedics;  Laterality: Left;    Family History  Problem Relation Age of Onset  . Adopted: Yes    Social History:  reports that she has quit smoking. Her smoking use included Cigarettes. She has a 40 pack-year smoking history. She has never used smokeless tobacco. She reports that she does not drink alcohol or use illicit drugs.  Allergies:  Allergies  Allergen Reactions  . Ace Inhibitors Anaphylaxis and Swelling    Angioedema  . Zestril [Lisinopril] Anaphylaxis and Swelling    Angioedema    Medications: I have reviewed the patient's current medications.  Results for orders placed or performed during the hospital encounter of 09/09/15 (from the past 48 hour(s))  Comprehensive metabolic panel     Status: Abnormal   Collection Time: 09/09/15  1:25  PM  Result Value Ref Range   Sodium 137 135 - 145 mmol/L   Potassium 3.4 (L) 3.5 - 5.1 mmol/L   Chloride 105 101 - 111 mmol/L   CO2 25 22 - 32 mmol/L   Glucose, Bld 154 (H) 65 - 99 mg/dL   BUN 17 6 - 20 mg/dL   Creatinine, Ser 1.15 (H) 0.44 - 1.00 mg/dL   Calcium 8.9 8.9 - 10.3 mg/dL   Total Protein 7.5 6.5 - 8.1 g/dL   Albumin 3.6 3.5 - 5.0 g/dL   AST 18 15 - 41 U/L   ALT 14 14 - 54 U/L   Alkaline Phosphatase 84 38 - 126 U/L   Total Bilirubin 0.5 0.3 - 1.2 mg/dL   GFR calc non Af Amer 52 (L) >60 mL/min   GFR calc Af Amer >60 >60 mL/min    Comment: (NOTE) The eGFR has been calculated using the CKD EPI equation. This calculation has not been validated in all clinical situations. eGFR's persistently <60  mL/min signify possible Chronic Kidney Disease.    Anion gap 7 5 - 15  CBC WITH DIFFERENTIAL     Status: None   Collection Time: 09/09/15  1:25 PM  Result Value Ref Range   WBC 9.3 4.0 - 10.5 K/uL   RBC 4.49 3.87 - 5.11 MIL/uL   Hemoglobin 12.2 12.0 - 15.0 g/dL   HCT 38.2 36.0 - 46.0 %   MCV 85.1 78.0 - 100.0 fL   MCH 27.2 26.0 - 34.0 pg   MCHC 31.9 30.0 - 36.0 g/dL   RDW 13.7 11.5 - 15.5 %   Platelets 256 150 - 400 K/uL   Neutrophils Relative % 72 %   Neutro Abs 6.7 1.7 - 7.7 K/uL   Lymphocytes Relative 21 %   Lymphs Abs 1.9 0.7 - 4.0 K/uL   Monocytes Relative 5 %   Monocytes Absolute 0.5 0.1 - 1.0 K/uL   Eosinophils Relative 2 %   Eosinophils Absolute 0.2 0.0 - 0.7 K/uL   Basophils Relative 0 %   Basophils Absolute 0.0 0.0 - 0.1 K/uL  Blood Culture (routine x 2)     Status: None (Preliminary result)   Collection Time: 09/09/15  1:25 PM  Result Value Ref Range   Specimen Description BLOOD RIGHT FOREARM    Special Requests BOTTLES DRAWN AEROBIC AND ANAEROBIC 5CC    Culture NO GROWTH 1 DAY    Report Status PENDING   Blood Culture (routine x 2)     Status: None (Preliminary result)   Collection Time: 09/09/15  1:36 PM  Result Value Ref Range   Specimen Description BLOOD LEFT FOREARM    Special Requests IN PEDIATRIC BOTTLE 5CC    Culture NO GROWTH 1 DAY    Report Status PENDING   I-Stat CG4 Lactic Acid, ED  (not at  [facility])     Status: Abnormal   Collection Time: 09/09/15  1:47 PM  Result Value Ref Range   Lactic Acid, Venous 1.91 (HH) 0.5 - 1.9 mmol/L  Urinalysis, Routine w reflex microscopic (not at [facility])     Status: None   Collection Time: 09/09/15  2:27 PM  Result Value Ref Range   Color, Urine YELLOW YELLOW   APPearance CLEAR CLEAR   Specific Gravity, Urine 1.019 1.005 - 1.030   pH 6.0 5.0 - 8.0   Glucose, UA NEGATIVE NEGATIVE mg/dL   Hgb urine dipstick NEGATIVE NEGATIVE   Bilirubin Urine NEGATIVE NEGATIVE   Ketones, ur NEGATIVE   NEGATIVE mg/dL   Protein, ur  NEGATIVE NEGATIVE mg/dL   Nitrite NEGATIVE NEGATIVE   Leukocytes, UA NEGATIVE NEGATIVE    Comment: MICROSCOPIC NOT DONE ON URINES WITH NEGATIVE PROTEIN, BLOOD, LEUKOCYTES, NITRITE, OR GLUCOSE <1000 mg/dL.  Urine culture     Status: Abnormal   Collection Time: 09/09/15  2:27 PM  Result Value Ref Range   Specimen Description URINE, RANDOM    Special Requests NONE    Culture MULTIPLE SPECIES PRESENT, SUGGEST RECOLLECTION (A)    Report Status 09/10/2015 FINAL   I-Stat CG4 Lactic Acid, ED  (not at  [facility])     Status: None   Collection Time: 09/09/15  4:01 PM  Result Value Ref Range   Lactic Acid, Venous 1.80 0.5 - 1.9 mmol/L  Protime-INR     Status: None   Collection Time: 09/09/15  6:30 PM  Result Value Ref Range   Prothrombin Time 14.8 11.6 - 15.2 seconds   INR 1.14 0.00 - 1.49  Glucose, capillary     Status: Abnormal   Collection Time: 09/09/15 10:23 PM  Result Value Ref Range   Glucose-Capillary 122 (H) 65 - 99 mg/dL  C-reactive protein     Status: Abnormal   Collection Time: 09/09/15 11:25 PM  Result Value Ref Range   CRP 7.7 (H) <1.0 mg/dL  Basic metabolic panel     Status: Abnormal   Collection Time: 09/10/15  3:32 AM  Result Value Ref Range   Sodium 141 135 - 145 mmol/L   Potassium 3.8 3.5 - 5.1 mmol/L   Chloride 109 101 - 111 mmol/L   CO2 27 22 - 32 mmol/L   Glucose, Bld 118 (H) 65 - 99 mg/dL   BUN 15 6 - 20 mg/dL   Creatinine, Ser 1.16 (H) 0.44 - 1.00 mg/dL   Calcium 8.5 (L) 8.9 - 10.3 mg/dL   GFR calc non Af Amer 51 (L) >60 mL/min   GFR calc Af Amer 59 (L) >60 mL/min    Comment: (NOTE) The eGFR has been calculated using the CKD EPI equation. This calculation has not been validated in all clinical situations. eGFR's persistently <60 mL/min signify possible Chronic Kidney Disease.    Anion gap 5 5 - 15  CBC     Status: Abnormal   Collection Time: 09/10/15  3:32 AM  Result Value Ref Range   WBC 6.9 4.0 - 10.5 K/uL   RBC 4.11 3.87 - 5.11 MIL/uL   Hemoglobin 11.0  (L) 12.0 - 15.0 g/dL   HCT 35.1 (L) 36.0 - 46.0 %   MCV 85.4 78.0 - 100.0 fL   MCH 26.8 26.0 - 34.0 pg   MCHC 31.3 30.0 - 36.0 g/dL   RDW 14.0 11.5 - 15.5 %   Platelets 219 150 - 400 K/uL  Lipid panel     Status: Abnormal   Collection Time: 09/10/15  3:32 AM  Result Value Ref Range   Cholesterol 176 0 - 200 mg/dL   Triglycerides 85 <150 mg/dL   HDL 59 >40 mg/dL   Total CHOL/HDL Ratio 3.0 RATIO   VLDL 17 0 - 40 mg/dL   LDL Cholesterol 100 (H) 0 - 99 mg/dL    Comment:        Total Cholesterol/HDL:CHD Risk Coronary Heart Disease Risk Table                     Men   Women  1/2 Average Risk   3.4   3.3    Average Risk       5.0   4.4  2 X Average Risk   9.6   7.1  3 X Average Risk  23.4   11.0        Use the calculated Patient Ratio above and the CHD Risk Table to determine the patient's CHD Risk.        ATP III CLASSIFICATION (LDL):  <100     mg/dL   Optimal  100-129  mg/dL   Near or Above                    Optimal  130-159  mg/dL   Borderline  160-189  mg/dL   High  >190     mg/dL   Very High   Glucose, capillary     Status: Abnormal   Collection Time: 09/10/15  6:04 AM  Result Value Ref Range   Glucose-Capillary 120 (H) 65 - 99 mg/dL  Glucose, capillary     Status: None   Collection Time: 09/10/15 11:22 AM  Result Value Ref Range   Glucose-Capillary 98 65 - 99 mg/dL   Comment 1 Notify RN   Glucose, capillary     Status: Abnormal   Collection Time: 09/10/15  4:34 PM  Result Value Ref Range   Glucose-Capillary 113 (H) 65 - 99 mg/dL   Comment 1 Notify RN   Glucose, capillary     Status: Abnormal   Collection Time: 09/10/15  9:17 PM  Result Value Ref Range   Glucose-Capillary 129 (H) 65 - 99 mg/dL    Mr Foot Left Wo Contrast  09/10/2015  CLINICAL DATA:  Amputation 10 days ago.  Nonhealing wound. EXAM: MRI OF THE LEFT FOREFOOT WITHOUT CONTRAST TECHNIQUE: Multiplanar, multisequence MR imaging was performed. No intravenous contrast was administered. COMPARISON:  None.  FINDINGS: Bones/Joint/Cartilage Amputation of the fifth ray. Severe soft tissue swelling at the amputation site with a small amount air adjacent to the distal lateral aspect of the cuboid with cortical irregularity concerning for osteomyelitis of the cuboid. Marrow edema at the base of the fourth metatarsal without definite cortical destruction which may reflect reactive marrow edema versus early mild osteomyelitis. Posttraumatic deformity of the fourth metatarsal. No acute fracture or dislocation.  No joint effusion. Ligaments Collateral ligaments are intact. Tendons Flexor, peroneal and extensor compartment tendons are intact. Muscles Generalized edema within the plantar musculature likely neurogenic given the patient's history diabetes. Soft tissue Soft tissue wound along the lateral aspect of the midfoot with severe underlying soft tissue edema. 1.3 x 1.9 cm T2 hyperintense fluid collection along the lateral aspect of the fourth metatarsal concerning for an abscess. IMPRESSION: 1. Amputation of the fifth ray. Severe soft tissue swelling at the amputation site with a small amount air adjacent to the distal lateral aspect of the cuboid with cortical irregularity concerning for osteomyelitis of the cuboid. 2. Soft tissue wound along the lateral aspect of the midfoot with severe underlying soft tissue edema concerning for extensive cellulitis. 1.3 x 1.9 cm T2 hyperintense fluid collection along the lateral aspect of the fourth metatarsal concerning for an abscess. 3. Marrow edema at the base of the fourth metatarsal without definite cortical destruction which may reflect reactive marrow edema versus early mild osteomyelitis. Electronically Signed   By: Kathreen Devoid   On: 09/10/2015 20:08   Dg Chest Port 1 View  09/09/2015  CLINICAL DATA:  Shortness of breath. EXAM: PORTABLE CHEST 1 VIEW COMPARISON:  Radiographs of March 02, 2015.  FINDINGS: The heart size and mediastinal contours are within normal limits. Both  lungs are clear. No pneumothorax or pleural effusion is noted. The visualized skeletal structures are unremarkable. IMPRESSION: No acute cardiopulmonary abnormality seen. Electronically Signed   By: Marijo Conception, M.D.   On: 09/09/2015 14:09   Dg Foot Complete Left  09/09/2015  CLINICAL DATA:  Recent surgical amputation of fifth toe. EXAM: LEFT FOOT - COMPLETE 3+ VIEW COMPARISON:  Radiograph August 21, 2015. FINDINGS: Status post interval surgical amputation of the fifth metatarsal and phalanges. Old healed fourth metatarsal fracture is noted. Soft tissue gas and swelling is seen overlying amputation site most consistent with postoperative findings, but inflammation or infection cannot be excluded. No lytic destruction is seen at this time to suggest acute osteomyelitis. IMPRESSION: Status post interval surgical amputation of fifth metatarsal and phalanges with overlying soft tissue gas and swelling most consistent with expected postoperative findings, but acute inflammation or infection cannot be excluded. No definite evidence of osteomyelitis is seen at this time. Electronically Signed   By: Marijo Conception, M.D.   On: 09/09/2015 14:15    Review of Systems  Constitutional: Positive for fever and chills.  HENT:       S/p larngectomy  Eyes: Negative for blurred vision.  Cardiovascular: Negative for chest pain.  Gastrointestinal: Negative for nausea and vomiting.  Skin: Negative for rash.  Neurological: Negative for headaches.  Psychiatric/Behavioral: The patient is not nervous/anxious.    Blood pressure 133/67, pulse 75, temperature 99.2 F (37.3 C), temperature source Oral, resp. rate 16, SpO2 96 %. Physical Exam  Neck:  larnengectomy scar  Cardiovascular: Normal rate and regular rhythm.   Respiratory: Effort normal. She has rales.  GI: Soft. Bowel sounds are normal.  Psychiatric: She has a normal mood and affect. Thought content normal.  Did not go to hospital for admission as instructed  on Friday- noncompliant.  States vending machines in ER broken and she felt her sugar was low so she left.    Assessment/Plan: Will continue to follow.  If not continuing to improve will post for Surgery as add on case on Tuesday evening for wash out and VAC placement.  MRI shows no definite osteo.  Needs darco shoe which I will order and needs to keep foot elevated which I have reminded her again tonight. Will follow with you. Dr. Sharol Given out till end of week. My cell [phone]  Marybelle Killings 09/10/2015, 10:45 PM

## 2015-09-11 DIAGNOSIS — L089 Local infection of the skin and subcutaneous tissue, unspecified: Secondary | ICD-10-CM

## 2015-09-11 DIAGNOSIS — A419 Sepsis, unspecified organism: Secondary | ICD-10-CM

## 2015-09-11 DIAGNOSIS — T148 Other injury of unspecified body region: Secondary | ICD-10-CM

## 2015-09-11 DIAGNOSIS — B999 Unspecified infectious disease: Secondary | ICD-10-CM

## 2015-09-11 LAB — CBC
HEMATOCRIT: 39 % (ref 36.0–46.0)
Hemoglobin: 12.3 g/dL (ref 12.0–15.0)
MCH: 27 pg (ref 26.0–34.0)
MCHC: 31.5 g/dL (ref 30.0–36.0)
MCV: 85.5 fL (ref 78.0–100.0)
PLATELETS: 255 10*3/uL (ref 150–400)
RBC: 4.56 MIL/uL (ref 3.87–5.11)
RDW: 14 % (ref 11.5–15.5)
WBC: 7.9 10*3/uL (ref 4.0–10.5)

## 2015-09-11 LAB — GLUCOSE, CAPILLARY
GLUCOSE-CAPILLARY: 101 mg/dL — AB (ref 65–99)
GLUCOSE-CAPILLARY: 102 mg/dL — AB (ref 65–99)
GLUCOSE-CAPILLARY: 125 mg/dL — AB (ref 65–99)
Glucose-Capillary: 89 mg/dL (ref 65–99)

## 2015-09-11 MED ORDER — NYSTATIN 100000 UNIT/ML MT SUSP
5.0000 mL | Freq: Four times a day (QID) | OROMUCOSAL | Status: DC
  Administered 2015-09-11 – 2015-09-15 (×16): 500000 [IU] via ORAL
  Filled 2015-09-11 (×18): qty 5

## 2015-09-11 MED ORDER — IBUPROFEN 200 MG PO TABS
600.0000 mg | ORAL_TABLET | Freq: Four times a day (QID) | ORAL | Status: DC | PRN
  Administered 2015-09-11 – 2015-09-15 (×3): 600 mg via ORAL
  Filled 2015-09-11 (×2): qty 3

## 2015-09-11 NOTE — Progress Notes (Signed)
Subjective: Doing well.  No complaints     Objective: Vital signs in last 24 hours: Temp:  [97.8 F (36.6 C)-99.2 F (37.3 C)] 98.4 F (36.9 C) (07/17 0931) Pulse Rate:  [70-78] 70 (07/17 0931) Resp:  [16-18] 18 (07/17 0931) BP: (117-133)/(65-74) 122/74 mmHg (07/17 0931) SpO2:  [92 %-100 %] 97 % (07/17 0931)  Intake/Output from previous day:   Intake/Output this shift:     Recent Labs  09/08/15 1704 09/09/15 1325 09/10/15 0332 09/11/15 0303  HGB 12.1 12.2 11.0* 12.3    Recent Labs  09/10/15 0332 09/11/15 0303  WBC 6.9 7.9  RBC 4.11 4.56  HCT 35.1* 39.0  PLT 219 255    Recent Labs  09/09/15 1325 09/10/15 0332  NA 137 141  K 3.4* 3.8  CL 105 109  CO2 25 27  BUN 17 15  CREATININE 1.15* 1.16*  GLUCOSE 154* 118*  CALCIUM 8.9 8.5*    Recent Labs  09/09/15 1830  INR 1.14    Exam:  Alert and oriented.  Dressing left foot intact.  Calf nontender.  Assessment/Plan: Will discuss with Dr Lorin Mercy continuing conservative management vs surgery.    [name] M 09/11/2015, 9:35 AM

## 2015-09-11 NOTE — Progress Notes (Signed)
Orthopedic Tech Progress Note Patient Details:  [name] 28-Jan-1956 [id number]  Ortho Devices Type of Ortho Device: Darco shoe Ortho Device/Splint Location: LLE foot Ortho Device/Splint Interventions: Ordered, Application   Braulio Bosch 09/11/2015, 3:36 PM

## 2015-09-11 NOTE — Progress Notes (Signed)
Family Medicine Teaching Service Daily Progress Note Intern Pager: [phone]  Patient name: [name] Medical record number: [id number] Date of birth: 08-Jaziya-1957 Age: 60 y.o. Gender: female  Primary Care Provider: Kerin Perna, NP Consultants: orthopedics Code Status: full  Pt Overview and Major Events to Date:  7/15: admitted for IV antibiotic for diabetic foot ulcer drainage  Assessment and Plan: [name] is a 60 y.o. female presenting with pain and drainage from her left foot s/p fifth metatarsal amputation on (6/30). 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  Left foot pain s/p left foot fifth metatarsal raw amputation on 6/30:  Pain improving during admission, seen by Dr Lorin Mercy from ortho on 7/16. MRI on 7/16 show possible osteo. Will schedule for surgical washout and wound Vac on Tuesday if pain worsens. Blood culture continue to show NGTD (48hrs). -Continue Vancomycin and zosyn (7/15>) -Follow-up blood and urine cultures (collected in ED) --Elevate foot --Will need Darco shoe on discharge --Ortho following-appreciate recs  DM type II/Diabetic neuropathy: CBG 89 this morning. on Lantus 34 units at home, seemed to be well controlled. Last A1c was 6.0 on 6/30.  -Lantus 17 units + SSI -CBG qACHS -Continue Pregabalin 75 mg for neuropathy -Lipid panel. At risk for ASCVD.  CKD III, at baseline: Creatinine 1.16 (at baseline). Status post 3 L normal saline in ED -BMP as needed  Essential Hypertension: normotensive. -Continue home amlodipine  Asthma: Stable -Continue albuterol q4 prn  Chronic Pain 2/2 knee and back pain: On Suboxone 8-2 milligrams -Will continue buprenorphine '8mg'$  per pharmacy's recommendation  FEN/GI:  -Carb modified -Saline lock  Prophylaxis: Lovenox  Disposition: inpatient observation pending assessment by orthopedics   Subjective:  Pain has improved overnight, patient has  been elevating leg with some inprovement in her lower extremity swelling. Patient continue to deny, fever, chills, diarrhea or abdominal pain.  Objective: Temp:  [97.8 F (36.6 C)-99.2 F (37.3 C)] 98.2 F (36.8 C) (07/17 0500) Pulse Rate:  [71-78] 71 (07/17 0500) Resp:  [16] 16 (07/17 0500) BP: (117-133)/(65-68) 117/65 mmHg (07/17 0500) SpO2:  [92 %-100 %] 92 % (07/17 0500) Physical Exam: General: obese female sitting on side of bed; pleasant, in NAD Eyes: PERRLA, EOMI, no scleral icterus ENTM: MMM, no oropharyngeal erythema or exudates Neck: normal ROM, no JVD, supple, tracheotomy valve Cardiovascular: RRR, normal S1 and S2, no murmurs, rubs or gallops appreciated Respiratory: CTAB, normal WOB on RA  Abdomen: non distended, non-tender, +BS MSK: bilateral non pitting edema; left foot swelling s/p left fifth metatarsal amputation, with some drainage and mild erythema Skin: Lower extremities bilateral hyperpigmentation 2/2 to venous congestion  Neuro: A&Ox3, no focal deficits Psych: appropriate mood and affect  Laboratory:  Recent Labs Lab 09/09/15 1325 09/10/15 0332 09/11/15 0303  WBC 9.3 6.9 7.9  HGB 12.2 11.0* 12.3  HCT 38.2 35.1* 39.0  PLT 256 219 255    Recent Labs Lab 09/08/15 1704 09/09/15 1325 09/10/15 0332  NA 139 137 141  K 3.5 3.4* 3.8  CL 103 105 109  CO2 '28 25 27  '$ BUN '20 17 15  '$ CREATININE 1.32* 1.15* 1.16*  CALCIUM 9.3 8.9 8.5*  PROT 6.9 7.5  --   BILITOT 0.4 0.5  --   ALKPHOS 89 84  --   ALT 15 14  --   AST 17 18  --   GLUCOSE 115* 154* 118*   176  Cholesterol             Triglycerides        85         Triglycerides    HDL        59         HDL    LDL Cholesterol        100         LDL Cholesterol    VLDL        17         VLDL    Total CHOL/HDL Ratio                            Imaging/Diagnostic Tests: Dg Chest Port 1 View  09/09/2015   IMPRESSION: No acute cardiopulmonary abnormality seen. Electronically Signed   By:  Marijo Conception, M.D.   On: 09/09/2015 14:09   Dg Foot Complete Left  09/09/2015  IMPRESSION: Status post interval surgical amputation of fifth metatarsal and phalanges with overlying soft tissue gas and swelling most consistent with expected postoperative findings, but acute inflammation or infection cannot be excluded. No definite evidence of osteomyelitis is seen at this time. Electronically Signed   By: Marijo Conception, M.D.   On: 09/09/2015 14:15    Marjie Skiff, MD 09/11/2015, 7:14 AM PGY-1, [facility] Intern pager: [phone], text pages welcome

## 2015-09-11 NOTE — Progress Notes (Signed)
Patient ID: [name], female   DOB: 10/05/1955, 60 y.o.   MRN: [id number] Recheck of plantar portion of wound shows it continues to pull apart. She again was sitting with foot down and with pitting edema and venous stasis problems bilat. , she is not likely to ever heal this even with VAC etc. Will order darco shoe for OOB to BR.  Will post for left BKA on Wednesday at about 5 PM.

## 2015-09-12 DIAGNOSIS — B999 Unspecified infectious disease: Secondary | ICD-10-CM | POA: Insufficient documentation

## 2015-09-12 LAB — GLUCOSE, CAPILLARY
GLUCOSE-CAPILLARY: 134 mg/dL — AB (ref 65–99)
GLUCOSE-CAPILLARY: 130 mg/dL — AB (ref 65–99)
Glucose-Capillary: 75 mg/dL (ref 65–99)
Glucose-Capillary: 68 mg/dL (ref 65–99)

## 2015-09-12 LAB — MRSA PCR SCREENING: MRSA BY PCR: NEGATIVE

## 2015-09-12 LAB — VANCOMYCIN, TROUGH: VANCOMYCIN TR: 27 ug/mL — AB (ref 15–20)

## 2015-09-12 MED ORDER — VANCOMYCIN HCL IN DEXTROSE 750-5 MG/150ML-% IV SOLN
750.0000 mg | Freq: Two times a day (BID) | INTRAVENOUS | Status: DC
Start: 2015-09-13 — End: 2015-09-13
  Administered 2015-09-13: 750 mg via INTRAVENOUS
  Filled 2015-09-12 (×2): qty 150

## 2015-09-12 NOTE — Progress Notes (Signed)
Patient ID: [name], female   DOB: 1955-06-21, 60 y.o.   MRN: [id number]   Patient seen.  Doing well.  No specific complaints. Scheduled for left BKA tomorrow.  This discussed with patient.

## 2015-09-12 NOTE — Progress Notes (Addendum)
Lab Called Vanco trough was 27

## 2015-09-12 NOTE — Progress Notes (Signed)
Pharmacy Antibiotic Note  [name] is a 60 y.o. female admitted on 09/09/2015 with diabetic foot infection.  Pharmacy has been consulted for vancomycin dosing.  Plan -Restart vancomycin [id number] at 1000 on 7/19.  -Monitor renal function and clinical improvement. -BKA planned for 7/19, predict we will have to dose adjust vancomycin further if patient remains on antibiotics post-procedure.    Temp (24hrs), Avg:98.4 F (36.9 C), Min:98 F (36.7 C), Max:98.9 F (37.2 C)   Recent Labs Lab 09/08/15 1704 09/08/15 1759 09/09/15 1325 09/09/15 1347 09/09/15 1601 09/10/15 0332 09/11/15 0303 09/12/15 1305  WBC 8.3  --  9.3  --   --  6.9 7.9  --   CREATININE 1.32*  --  1.15*  --   --  1.16*  --   --   LATICACIDVEN  --  1.77  --  1.91* 1.80  --   --   --   VANCOTROUGH  --   --   --   --   --   --   --  27*    CrCl: 84 mL/min    Allergies  Allergen Reactions  . Ace Inhibitors Anaphylaxis and Swelling    Angioedema  . Zestril [Lisinopril] Anaphylaxis and Swelling    Angioedema    Antimicrobials this admission: Vancomycin 7/15 >>  Zosyn 7/15>>   Dose adjustments this admission: 7/18 Vanc trough 27 mg/L on 1250q12h. Dose changed to 750q12h to be started at 10 am on 7/19. Patient ke 0.0412/hr, Vd 69.9L. Est. time to therapeutic trough from 7/18 1300 dose is 22 hours.      Thank you for allowing pharmacy to be a part of this patient's care.  [name], [name], PharmD Clinical Pharmacy Resident [phone] (Pager) 09/12/2015 2:43 PM

## 2015-09-12 NOTE — Progress Notes (Signed)
Family Medicine Teaching Service Daily Progress Note Intern Pager: [phone]  Patient name: [name] Medical record number: [id number] Date of birth: 06-13-1955 Age: 60 y.o. Gender: female  Primary Care Provider: Kerin Perna, NP Consultants: orthopedics Code Status: full  Pt Overview and Major Events to Date:  7/15: admitted for IV antibiotic for diabetic foot ulcer drainage  Assessment and Plan: [name] is a 60 y.o. female presenting with pain and drainage from her left foot s/p fifth metatarsal amputation on (6/30). 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  Left foot pain s/p left foot fifth metatarsal raw amputation on 6/30:  Patient was seen by Dr. Lorin Mercy from orthopedic surgery. Patient still display significant LE edema secondary to venous statis. Wound dehiscence noted by surgery on exam, washout with wound vac unlikely to improve wound healing with patient extensive history of venous stasis and poor circulation. Patient schedule for BKA on 7/19 in the afternoon by Dr.Yates.  --Continue Vancomycin and zosyn (7/15>) --Follow-up blood and urine cultures (collected in ED) --Elevate foot --Darco shoe ordered by ortho 7/17 --Ortho following-appreciate recs  DM type II/Diabetic neuropathy: CBG 89 this morning. on Lantus 34 units at home, seemed to be well controlled. Last A1c was 6.0 on 6/30.  -Lantus 17 units + SSI -CBG qACHS -Continue Pregabalin 75 mg for neuropathy -Lipid panel. At risk for ASCVD.  CKD III, at baseline: Creatinine 1.16 (at baseline). Status post 3 L normal saline in ED -BMP as needed  Essential Hypertension: normotensive. -Continue home amlodipine  Asthma: Stable -Continue albuterol q4 prn  Chronic Pain 2/2 knee and back pain: On Suboxone 8-2 milligrams -Will continue buprenorphine '8mg'$  per pharmacy's recommendation  FEN/GI:  -Carb modified -Saline lock  Prophylaxis:  Lovenox  Disposition: inpatient observation pending assessment by orthopedics   Subjective:  Patient in good spirit well this morning, with recent news for BKA scheduled tomorrow. Patient leg pain seem to be well controlled. Denies fever, chills, nausea, vomiting, and abdominal pain.  Objective: Temp:  [98 F (36.7 C)-98.4 F (36.9 C)] 98.2 F (36.8 C) (07/18 0500) Pulse Rate:  [61-70] 61 (07/18 0500) Resp:  [16-18] 16 (07/18 0500) BP: (103-122)/(46-74) 105/46 mmHg (07/18 0500) SpO2:  [92 %-97 %] 92 % (07/18 0500) Physical Exam: General: obese female sitting on side of bed; pleasant, in NAD Eyes: PERRLA, EOMI, no scleral icterus ENTM: MMM, no oropharyngeal erythema or exudates Neck: normal ROM, no JVD, supple, tracheotomy valve Cardiovascular: RRR, normal S1 and S2, no murmurs, rubs or gallops appreciated Respiratory: CTAB, normal WOB on RA  Abdomen: non distended, non-tender, +BS MSK: bilateral non pitting edema; left foot swelling s/p left fifth metatarsal amputation, with some drainage and mild erythema Skin: Lower extremities bilateral hyperpigmentation 2/2 to venous congestion  Neuro: A&Ox3, no focal deficits Psych: appropriate mood and affect  Laboratory:  Recent Labs Lab 09/09/15 1325 09/10/15 0332 09/11/15 0303  WBC 9.3 6.9 7.9  HGB 12.2 11.0* 12.3  HCT 38.2 35.1* 39.0  PLT 256 219 255    Recent Labs Lab 09/08/15 1704 09/09/15 1325 09/10/15 0332  NA 139 137 141  K 3.5 3.4* 3.8  CL 103 105 109  CO2 '28 25 27  '$ BUN '20 17 15  '$ CREATININE 1.32* 1.15* 1.16*  CALCIUM 9.3 8.9 8.5*  PROT 6.9 7.5  --   BILITOT 0.4 0.5  --   ALKPHOS 89 84  --   ALT 15 14  --  AST 17 18  --   GLUCOSE 115* 154* 118*   176         Cholesterol             Triglycerides        85         Triglycerides    HDL        59         HDL    LDL Cholesterol        100         LDL Cholesterol    VLDL        17         VLDL    Total CHOL/HDL Ratio                             Imaging/Diagnostic Tests: Dg Chest Port 1 View  09/09/2015   IMPRESSION: No acute cardiopulmonary abnormality seen. Electronically Signed   By: Marijo Conception, M.D.   On: 09/09/2015 14:09   Dg Foot Complete Left  09/09/2015  IMPRESSION: Status post interval surgical amputation of fifth metatarsal and phalanges with overlying soft tissue gas and swelling most consistent with expected postoperative findings, but acute inflammation or infection cannot be excluded. No definite evidence of osteomyelitis is seen at this time. Electronically Signed   By: Marijo Conception, M.D.   On: 09/09/2015 14:15    Marjie Skiff, MD 09/12/2015, 9:11 AM PGY-1, [place] Intern pager: [phone], text pages welcome

## 2015-09-13 ENCOUNTER — Inpatient Hospital Stay (HOSPITAL_COMMUNITY): Payer: Medicaid Other | Admitting: Certified Registered Nurse Anesthetist

## 2015-09-13 ENCOUNTER — Encounter (HOSPITAL_COMMUNITY): Payer: Self-pay | Admitting: Certified Registered"

## 2015-09-13 ENCOUNTER — Encounter (HOSPITAL_COMMUNITY)
Admission: EM | Disposition: A | Discharge status: Home / Self Care | Payer: Self-pay | Source: Home / Self Care | Attending: Family Medicine

## 2015-09-13 DIAGNOSIS — I10 Essential (primary) hypertension: Secondary | ICD-10-CM

## 2015-09-13 DIAGNOSIS — G4733 Obstructive sleep apnea (adult) (pediatric): Secondary | ICD-10-CM

## 2015-09-13 DIAGNOSIS — E1169 Type 2 diabetes mellitus with other specified complication: Secondary | ICD-10-CM

## 2015-09-13 DIAGNOSIS — I739 Peripheral vascular disease, unspecified: Secondary | ICD-10-CM

## 2015-09-13 DIAGNOSIS — M86679 Other chronic osteomyelitis, unspecified ankle and foot: Secondary | ICD-10-CM

## 2015-09-13 DIAGNOSIS — L97509 Non-pressure chronic ulcer of other part of unspecified foot with unspecified severity: Secondary | ICD-10-CM

## 2015-09-13 HISTORY — PX: AMPUTATION: SHX166

## 2015-09-13 HISTORY — PX: LEG AMPUTATION BELOW KNEE: SHX694

## 2015-09-13 LAB — BASIC METABOLIC PANEL
ANION GAP: 10 (ref 5–15)
BUN: 17 mg/dL (ref 6–20)
CALCIUM: 8.9 mg/dL (ref 8.9–10.3)
CO2: 27 mmol/L (ref 22–32)
CREATININE: 1.32 mg/dL — AB (ref 0.44–1.00)
Chloride: 104 mmol/L (ref 101–111)
GFR calc Af Amer: 51 mL/min — ABNORMAL LOW (ref >60)
GFR, EST NON AFRICAN AMERICAN: 44 mL/min — AB (ref >60)
GLUCOSE: 89 mg/dL (ref 65–99)
Potassium: 4.4 mmol/L (ref 3.5–5.1)
Sodium: 141 mmol/L (ref 135–145)

## 2015-09-13 LAB — GLUCOSE, CAPILLARY
GLUCOSE-CAPILLARY: 175 mg/dL — AB (ref 65–99)
GLUCOSE-CAPILLARY: 75 mg/dL (ref 65–99)
GLUCOSE-CAPILLARY: 93 mg/dL (ref 65–99)
GLUCOSE-CAPILLARY: 197 mg/dL — AB (ref 65–99)
GLUCOSE-CAPILLARY: 184 mg/dL — AB (ref 65–99)

## 2015-09-13 SURGERY — AMPUTATION BELOW KNEE
Anesthesia: General | Laterality: Left

## 2015-09-13 MED ORDER — PROPOFOL 500 MG/50ML IV EMUL
INTRAVENOUS | Status: DC | PRN
  Administered 2015-09-13: 75 ug/kg/min via INTRAVENOUS

## 2015-09-13 MED ORDER — ONDANSETRON HCL 4 MG/2ML IJ SOLN
INTRAMUSCULAR | Status: AC
  Filled 2015-09-13: qty 2

## 2015-09-13 MED ORDER — FENTANYL CITRATE (PF) 250 MCG/5ML IJ SOLN
INTRAMUSCULAR | Status: AC
  Filled 2015-09-13: qty 5

## 2015-09-13 MED ORDER — ONDANSETRON HCL 4 MG/2ML IJ SOLN
4.0000 mg | Freq: Four times a day (QID) | INTRAMUSCULAR | Status: DC | PRN
  Administered 2015-09-14 (×2): 4 mg via INTRAVENOUS
  Filled 2015-09-13 (×2): qty 2

## 2015-09-13 MED ORDER — OXYCODONE HCL 5 MG PO TABS
5.0000 mg | ORAL_TABLET | ORAL | Status: DC | PRN
Start: 2015-09-13 — End: 2015-09-15
  Administered 2015-09-13 – 2015-09-15 (×7): 5 mg via ORAL
  Filled 2015-09-13 (×7): qty 1

## 2015-09-13 MED ORDER — LACTATED RINGERS IV SOLN
INTRAVENOUS | Status: DC
  Administered 2015-09-13: 17:00:00 via INTRAVENOUS

## 2015-09-13 MED ORDER — VANCOMYCIN HCL IN DEXTROSE 750-5 MG/150ML-% IV SOLN
750.0000 mg | Freq: Two times a day (BID) | INTRAVENOUS | Status: DC
  Administered 2015-09-13 – 2015-09-15 (×4): 750 mg via INTRAVENOUS
  Filled 2015-09-13 (×5): qty 150

## 2015-09-13 MED ORDER — MIDAZOLAM HCL 2 MG/2ML IJ SOLN
INTRAMUSCULAR | Status: AC
Start: 2015-09-13 — End: 2015-09-13
  Administered 2015-09-13: 2 mg via INTRAVENOUS
  Filled 2015-09-13: qty 2

## 2015-09-13 MED ORDER — PIPERACILLIN-TAZOBACTAM 3.375 G IVPB
3.3750 g | Freq: Three times a day (TID) | INTRAVENOUS | Status: DC
  Administered 2015-09-13 – 2015-09-15 (×6): 3.375 g via INTRAVENOUS
  Filled 2015-09-13 (×7): qty 50

## 2015-09-13 MED ORDER — EPHEDRINE 5 MG/ML INJ
INTRAVENOUS | Status: AC
  Filled 2015-09-13: qty 10

## 2015-09-13 MED ORDER — ACETAMINOPHEN 650 MG RE SUPP: 650.0000 mg | Freq: Four times a day (QID) | RECTAL | Status: DC | PRN

## 2015-09-13 MED ORDER — SUGAMMADEX SODIUM 200 MG/2ML IV SOLN
INTRAVENOUS | Status: AC
  Filled 2015-09-13: qty 2

## 2015-09-13 MED ORDER — PROPOFOL 10 MG/ML IV BOLUS
INTRAVENOUS | Status: DC | PRN
  Administered 2015-09-13: 100 mg via INTRAVENOUS

## 2015-09-13 MED ORDER — BUPIVACAINE-EPINEPHRINE (PF) 0.5% -1:200000 IJ SOLN
INTRAMUSCULAR | Status: DC | PRN
  Administered 2015-09-13: 30 mL via PERINEURAL
  Administered 2015-09-13: 20 mL via PERINEURAL

## 2015-09-13 MED ORDER — DOCUSATE SODIUM 100 MG PO CAPS
100.0000 mg | ORAL_CAPSULE | Freq: Two times a day (BID) | ORAL | Status: DC
  Administered 2015-09-13 – 2015-09-15 (×4): 100 mg via ORAL
  Filled 2015-09-13 (×4): qty 1

## 2015-09-13 MED ORDER — ONDANSETRON HCL 4 MG PO TABS: 4.0000 mg | ORAL_TABLET | Freq: Four times a day (QID) | ORAL | Status: DC | PRN

## 2015-09-13 MED ORDER — 0.9 % SODIUM CHLORIDE (POUR BTL) OPTIME
TOPICAL | Status: DC | PRN
  Administered 2015-09-13: 1000 mL

## 2015-09-13 MED ORDER — METOCLOPRAMIDE HCL 5 MG/ML IJ SOLN: 5.0000 mg | Freq: Three times a day (TID) | INTRAMUSCULAR | Status: DC | PRN

## 2015-09-13 MED ORDER — ONDANSETRON HCL 4 MG/2ML IJ SOLN
INTRAMUSCULAR | Status: AC
  Filled 2015-09-13: qty 4

## 2015-09-13 MED ORDER — METOCLOPRAMIDE HCL 5 MG PO TABS: 5.0000 mg | ORAL_TABLET | Freq: Three times a day (TID) | ORAL | Status: DC | PRN

## 2015-09-13 MED ORDER — ONDANSETRON HCL 4 MG/2ML IJ SOLN
INTRAMUSCULAR | Status: DC | PRN
  Administered 2015-09-13: 4 mg via INTRAVENOUS

## 2015-09-13 MED ORDER — ACETAMINOPHEN 325 MG PO TABS: 650.0000 mg | ORAL_TABLET | Freq: Four times a day (QID) | ORAL | Status: DC | PRN

## 2015-09-13 MED ORDER — ROCURONIUM BROMIDE 50 MG/5ML IV SOLN
INTRAVENOUS | Status: AC
  Filled 2015-09-13: qty 2

## 2015-09-13 MED ORDER — DEXAMETHASONE SODIUM PHOSPHATE 10 MG/ML IJ SOLN
INTRAMUSCULAR | Status: AC
  Filled 2015-09-13: qty 1

## 2015-09-13 MED ORDER — OXYCODONE HCL 5 MG PO TABS: 5.0000 mg | ORAL_TABLET | Freq: Once | ORAL | Status: DC | PRN

## 2015-09-13 MED ORDER — FENTANYL CITRATE (PF) 100 MCG/2ML IJ SOLN
INTRAMUSCULAR | Status: AC
  Administered 2015-09-13: 100 ug via INTRAVENOUS
  Filled 2015-09-13: qty 2

## 2015-09-13 MED ORDER — OXYCODONE HCL 5 MG/5ML PO SOLN: 5.0000 mg | Freq: Once | ORAL | Status: DC | PRN

## 2015-09-13 MED ORDER — FENTANYL CITRATE (PF) 100 MCG/2ML IJ SOLN: 25.0000 ug | INTRAMUSCULAR | Status: DC | PRN

## 2015-09-13 MED ORDER — HYDROMORPHONE HCL 1 MG/ML IJ SOLN
0.5000 mg | INTRAMUSCULAR | Status: DC | PRN
  Administered 2015-09-14 (×2): 0.5 mg via INTRAVENOUS
  Filled 2015-09-13 (×2): qty 1

## 2015-09-13 MED ORDER — LIDOCAINE 2% (20 MG/ML) 5 ML SYRINGE
INTRAMUSCULAR | Status: AC
  Filled 2015-09-13: qty 10

## 2015-09-13 MED ORDER — LIDOCAINE-EPINEPHRINE (PF) 2 %-1:200000 IJ SOLN
INTRAMUSCULAR | Status: DC | PRN
  Administered 2015-09-13 (×2): 10 mL via PERINEURAL

## 2015-09-13 SURGICAL SUPPLY — 56 items
BANDAGE ELASTIC 4 VELCRO ST LF (GAUZE/BANDAGES/DRESSINGS) ×3 IMPLANT
BANDAGE ELASTIC 6 VELCRO ST LF (GAUZE/BANDAGES/DRESSINGS) ×3 IMPLANT
BANDAGE ESMARK 6X9 LF (GAUZE/BANDAGES/DRESSINGS) IMPLANT
BLADE SAW SAG 29X58X.64 (BLADE) ×3 IMPLANT
BNDG CMPR 9X6 STRL LF SNTH (GAUZE/BANDAGES/DRESSINGS)
BNDG COHESIVE 4X5 TAN STRL (GAUZE/BANDAGES/DRESSINGS) ×2 IMPLANT
BNDG COHESIVE 6X5 TAN STRL LF (GAUZE/BANDAGES/DRESSINGS) ×3 IMPLANT
BNDG ESMARK 6X9 LF (GAUZE/BANDAGES/DRESSINGS)
BNDG GAUZE ELAST 4 BULKY (GAUZE/BANDAGES/DRESSINGS) ×4 IMPLANT
CLEANER TIP ELECTROSURG 2X2 (MISCELLANEOUS) ×3 IMPLANT
COVER SURGICAL LIGHT HANDLE (MISCELLANEOUS) ×3 IMPLANT
CUFF TOURNIQUET SINGLE 34IN LL (TOURNIQUET CUFF) ×2 IMPLANT
CUFF TOURNIQUET SINGLE 44IN (TOURNIQUET CUFF) IMPLANT
DRAIN PENROSE 1/2X12 LTX STRL (WOUND CARE) IMPLANT
DRAPE EXTREMITY T 121X128X90 (DRAPE) ×3 IMPLANT
DRAPE INCISE IOBAN 66X45 STRL (DRAPES) ×3 IMPLANT
DRAPE ORTHO SPLIT 77X108 STRL (DRAPES) ×3
DRAPE PROXIMA HALF (DRAPES) ×6 IMPLANT
DRAPE SURG ORHT 6 SPLT 77X108 (DRAPES) ×1 IMPLANT
DRAPE U-SHAPE 47X51 STRL (DRAPES) ×3 IMPLANT
DRSG PAD ABDOMINAL 8X10 ST (GAUZE/BANDAGES/DRESSINGS) ×2 IMPLANT
DURAPREP 26ML APPLICATOR (WOUND CARE) ×3 IMPLANT
EVACUATOR 1/8 PVC DRAIN (DRAIN) IMPLANT
GAUZE SPONGE 4X4 12PLY STRL (GAUZE/BANDAGES/DRESSINGS) ×3 IMPLANT
GAUZE XEROFORM 5X9 LF (GAUZE/BANDAGES/DRESSINGS) ×3 IMPLANT
GLOVE BIOGEL PI IND STRL 8 (GLOVE) ×2 IMPLANT
GLOVE BIOGEL PI INDICATOR 8 (GLOVE) ×4
GLOVE ORTHO TXT STRL SZ7.5 (GLOVE) ×6 IMPLANT
GOWN STRL REUS W/ TWL LRG LVL3 (GOWN DISPOSABLE) ×1 IMPLANT
GOWN STRL REUS W/ TWL XL LVL3 (GOWN DISPOSABLE) ×1 IMPLANT
GOWN STRL REUS W/TWL 2XL LVL3 (GOWN DISPOSABLE) ×3 IMPLANT
GOWN STRL REUS W/TWL LRG LVL3 (GOWN DISPOSABLE) ×3
GOWN STRL REUS W/TWL XL LVL3 (GOWN DISPOSABLE) ×3
KIT BASIN OR (CUSTOM PROCEDURE TRAY) ×3 IMPLANT
KIT ROOM TURNOVER OR (KITS) ×3 IMPLANT
MANIFOLD NEPTUNE II (INSTRUMENTS) ×3 IMPLANT
NDL MAYO TROCAR (NEEDLE) ×1 IMPLANT
NEEDLE MAYO TROCAR (NEEDLE) ×3 IMPLANT
NS IRRIG 1000ML POUR BTL (IV SOLUTION) ×3 IMPLANT
PACK GENERAL/GYN (CUSTOM PROCEDURE TRAY) ×3 IMPLANT
PAD ARMBOARD 7.5X6 YLW CONV (MISCELLANEOUS) ×6 IMPLANT
SPONGE GAUZE 4X4 12PLY STER LF (GAUZE/BANDAGES/DRESSINGS) ×2 IMPLANT
SPONGE LAP 18X18 X RAY DECT (DISPOSABLE) IMPLANT
STAPLER VISISTAT 35W (STAPLE) ×5 IMPLANT
STOCKINETTE IMPERVIOUS LG (DRAPES) IMPLANT
SUT ETHILON 2 0 PSLX (SUTURE) ×2 IMPLANT
SUT SILK 2 0SH CR/8 30 (SUTURE) ×4 IMPLANT
SUT SILK 3 0 SH CR/8 (SUTURE) ×3 IMPLANT
SUT VIC AB 0 CT1 27 (SUTURE) ×3
SUT VIC AB 0 CT1 27XBRD ANBCTR (SUTURE) ×1 IMPLANT
SUT VIC AB 1 CTX 18 (SUTURE) ×3 IMPLANT
SUT VICRYL 0 TIES 12 18 (SUTURE) ×3 IMPLANT
SUT VICRYL AB 2 0 TIES (SUTURE) ×3 IMPLANT
TOWEL OR 17X24 6PK STRL BLUE (TOWEL DISPOSABLE) ×3 IMPLANT
TOWEL OR 17X26 10 PK STRL BLUE (TOWEL DISPOSABLE) ×3 IMPLANT
WATER STERILE IRR 1000ML POUR (IV SOLUTION) ×3 IMPLANT

## 2015-09-13 NOTE — Anesthesia Procedure Notes (Addendum)
Anesthesia Regional Block:  Popliteal block  Pre-Anesthetic Checklist: ,, timeout performed, Correct Patient, Correct Site, Correct Laterality, Correct Procedure, Correct Position, site marked, Risks and benefits discussed, Surgical consent,  Pre-op evaluation,  At surgeon's request  Laterality: Lower and Left  Prep: chloraprep       Needles:  Injection technique: Single-shot  Needle Type: Echogenic Stimulator Needle          Additional Needles:  Procedures: ultrasound guided (picture in chart) and nerve stimulator Popliteal block  Nerve Stimulator or Paresthesia:  Response: plantar, 0.5 mA,   Additional Responses:   Narrative:  Injection made incrementally with aspirations every 5 mL.  Performed by: Personally  Anesthesiologist: MOSER, CHRISTOPHER  Additional Notes: H+P and labs reviewed, risks and benefits discussed with patient, procedure tolerated well without complications   Anesthesia Regional Block:  Femoral nerve block  Pre-Anesthetic Checklist: ,, timeout performed, Correct Patient, Correct Site, Correct Laterality, Correct Procedure, Correct Position, site marked, Risks and benefits discussed, Surgical consent,  Pre-op evaluation,  At surgeon's request  Laterality: Lower and Left  Prep: chloraprep       Needles:  Injection technique: Single-shot  Needle Type: Echogenic Stimulator Needle          Additional Needles:  Procedures: ultrasound guided (picture in chart) and nerve stimulator Femoral nerve block  Nerve Stimulator or Paresthesia:  Response: quad, 0.5 mA,   Additional Responses:   Narrative:  Injection made incrementally with aspirations every 5 mL.  Performed by: Personally  Anesthesiologist: MOSER, CHRISTOPHER  Additional Notes: H+P and labs reviewed, risks and benefits discussed with patient, procedure tolerated well without complications   Procedure Name: MAC Date/Time: 09/13/2015 5:49 PM Performed by: Sampson Si  E Pre-anesthesia Checklist: Patient identified, Emergency Drugs available, Suction available and Timeout performed Patient Re-evaluated:Patient Re-evaluated prior to inductionOxygen Delivery Method: Simple face mask Comments: Face mask placed over tracheostomy    Procedure Name: Intubation Date/Time: 09/13/2015 6:06 PM Performed by: Sampson Si E Pre-anesthesia Checklist: Patient identified, Emergency Drugs available, Suction available, Patient being monitored and Timeout performed Patient Re-evaluated:Patient Re-evaluated prior to inductionOxygen Delivery Method: Circle system utilized Preoxygenation: Pre-oxygenation with 100% oxygen Intubation Type: IV induction and Tracheostomy Tube size: 6.0 mm Number of attempts: 1 Placement Confirmation: positive ETCO2 and breath sounds checked- equal and bilateral Tube secured with: Tape

## 2015-09-13 NOTE — Progress Notes (Signed)
Patient ID: [name], female   DOB: Nov 01, 1955, 59 y.o.   MRN: [id number] BKA later today, all ?'s answered, she agrees to proceed.

## 2015-09-13 NOTE — Discharge Summary (Signed)
[facility] Discharge Summary  Patient name: [name] Medical record number: [id number] Date of birth: December 03, 1955 Age: 60 y.o. Gender: female Date of Admission: 09/09/2015  Date of Discharge: 09/13/2015 Admitting Physician: Blane Ohara McDiarmid, MD  Primary Care Provider: Kerin Perna, NP Consultants: Orthopedic Surgery  Indication for Hospitalization: Left foot pain s/p ray amputation of fifth metatarsal  Discharge Diagnoses/Problem List:  Left foot dehiscence s/p fifth metatarsal ray amputation secondary to diabetic ulcer  Disposition: CIR  Discharge Condition: Stable  Discharge Exam:  General: obese female sitting on side of bed; pleasant, in NAD Eyes: PERRLA, EOMI, no scleral icterus ENTM: MMM, no oropharyngeal erythema or exudates Neck: normal ROM, no JVD, supple, tracheotomy valve Cardiovascular: RRR, normal S1 and S2, no murmurs, rubs or gallops appreciated Respiratory: CTAB, normal WOB on RA  Abdomen: non distended, non-tender, +BS MSK: bilateral non pitting edema; left foot swelling s/p left fifth metatarsal amputation, w/ some drainage and erythema Skin: Lower extremities bilateral hyperpigmentation 2/2 to venous congestion  Neuro: A&Ox3, no focal deficits Psych: appropriate mood and affect   Brief Hospital Course:  [name] is a 60 y.o. female presenting with pain and drainage from her left foot s/p fifth metatarsal amputation on (6/30). 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  #Left foot pain s/p left foot fifth metatarsal raw amputation on 6/30:  Patient admitted to our service for medical management of left foot wound dehiscence s/p ray amputation on 08/23/2015. Patient was stable, with no signs of infection throughout admission, she was consulted on by her orthopedic team while inpatient. Ortho felt that because of patient's complicated medical history and  poor state of health, wound complication was very likely. Patient had left BKA on 7/19. Post surgery, patient was stable had a two day course of vancomycin and zosyn without any fever or other sign of systemic infection. Patient was stable with good pain control, she was evaluated and transferred to inpatient rehabilitation. Patient will follow up with orthopedic surgery clinic upon discharge from inpatient rehab.  #DM type II/Diabetic neuropathy: CBG 106 this morning.  Patient should resume Lantus 34 units at home, DM was well controlled prior to admission. Last A1c was 6.0 on 6/30.  -Continue Pregabalin 75 mg for neuropathy . #CKD III, at baseline stable throughout admission creatinine near baseline with IVF.  #Essential Hypertension: normotensive. -Continue home amlodipine  #Asthma: Stable -Continue albuterol q4 prn  #Chronic Pain 2/2 knee and back pain: On Suboxone 8-2 milligrams -Will continue buprenorphine 31m per pharmacy's recommendation  Issues for Follow Up:  1. Follow up with orthopedic surgery within a week  Significant Procedures: Left BKA s/p ray amputation of Right fifth metatarsal  Significant Labs and Imaging:   Recent Labs Lab 09/09/15 1325 09/10/15 0332 09/11/15 0303  WBC 9.3 6.9 7.9  HGB 12.2 11.0* 12.3  HCT 38.2 35.1* 39.0  PLT 256 219 255    Recent Labs Lab 09/08/15 1704 09/09/15 1325 09/10/15 0332 09/13/15 0406  NA 139 137 141 141  K 3.5 3.4* 3.8 4.4  CL 103 105 109 104  CO2 _0 GLUCOSE 115* 154* 118* 89  BUN _1 CREATININE 1.32* 1.15* 1.16* 1.32*  CALCIUM 9.3 8.9 8.5* 8.9  ALKPHOS 89 84  --   --   AST 17 18  --   --   ALT 15 14  --   --  ALBUMIN 3.6 3.6  --   --     Mr Foot Left Wo Contrast  09/10/2015  CLINICAL DATA:  Amputation 10 days ago.  Nonhealing wound. EXAM: MRI OF THE LEFT FOREFOOT WITHOUT CONTRAST TECHNIQUE: Multiplanar, multisequence MR imaging was performed. No intravenous contrast was administered.  COMPARISON:  None. FINDINGS: Bones/Joint/Cartilage Amputation of the fifth ray. Severe soft tissue swelling at the amputation site with a small amount air adjacent to the distal lateral aspect of the cuboid with cortical irregularity concerning for osteomyelitis of the cuboid. Marrow edema at the base of the fourth metatarsal without definite cortical destruction which may reflect reactive marrow edema versus early mild osteomyelitis. Posttraumatic deformity of the fourth metatarsal. No acute fracture or dislocation.  No joint effusion. Ligaments Collateral ligaments are intact. Tendons Flexor, peroneal and extensor compartment tendons are intact. Muscles Generalized edema within the plantar musculature likely neurogenic given the patient's history diabetes. Soft tissue Soft tissue wound along the lateral aspect of the midfoot with severe underlying soft tissue edema. 1.3 x 1.9 cm T2 hyperintense fluid collection along the lateral aspect of the fourth metatarsal concerning for an abscess. IMPRESSION: 1. Amputation of the fifth ray. Severe soft tissue swelling at the amputation site with a small amount air adjacent to the distal lateral aspect of the cuboid with cortical irregularity concerning for osteomyelitis of the cuboid. 2. Soft tissue wound along the lateral aspect of the midfoot with severe underlying soft tissue edema concerning for extensive cellulitis. 1.3 x 1.9 cm T2 hyperintense fluid collection along the lateral aspect of the fourth metatarsal concerning for an abscess. 3. Marrow edema at the base of the fourth metatarsal without definite cortical destruction which may reflect reactive marrow edema versus early mild osteomyelitis. Electronically Signed   By: Kathreen Devoid   On: 09/10/2015 20:08   Dg Chest Port 1 View  09/09/2015  CLINICAL DATA:  Shortness of breath. EXAM: PORTABLE CHEST 1 VIEW COMPARISON:  Radiographs of March 02, 2015. FINDINGS: The heart size and mediastinal contours are within  normal limits. Both lungs are clear. No pneumothorax or pleural effusion is noted. The visualized skeletal structures are unremarkable. IMPRESSION: No acute cardiopulmonary abnormality seen. Electronically Signed   By: Marijo Conception, M.D.   On: 09/09/2015 14:09   Dg Foot Complete Left  09/09/2015  CLINICAL DATA:  Recent surgical amputation of fifth toe. EXAM: LEFT FOOT - COMPLETE 3+ VIEW COMPARISON:  Radiograph August 21, 2015. FINDINGS: Status post interval surgical amputation of the fifth metatarsal and phalanges. Old healed fourth metatarsal fracture is noted. Soft tissue gas and swelling is seen overlying amputation site most consistent with postoperative findings, but inflammation or infection cannot be excluded. No lytic destruction is seen at this time to suggest acute osteomyelitis. IMPRESSION: Status post interval surgical amputation of fifth metatarsal and phalanges with overlying soft tissue gas and swelling most consistent with expected postoperative findings, but acute inflammation or infection cannot be excluded. No definite evidence of osteomyelitis is seen at this time. Electronically Signed   By: Marijo Conception, M.D.   On: 09/09/2015 14:15     Results/Tests Pending at Time of Discharge: None  Discharge Medications:    Medication List    ASK your doctor about these medications        albuterol 108 (90 Base) MCG/ACT inhaler  Commonly known as:  PROVENTIL HFA;VENTOLIN HFA  Inhale 2 puffs into the lungs every 4 (four) hours as needed for wheezing or shortness of breath.  amLODipine 10 MG tablet  Commonly known as:  NORVASC  Take 1 tablet (10 mg total) by mouth daily.     blood glucose meter kit and supplies Kit  Dispense based on patient and insurance preference. Use up to four times daily as directed. (FOR ICD-9 250.00, 250.01).     cyclobenzaprine 10 MG tablet  Commonly known as:  FLEXERIL  Take 10 mg by mouth at bedtime as needed for muscle spasms.     docusate  sodium 100 MG capsule  Commonly known as:  COLACE  Take 100 mg by mouth daily as needed for mild constipation.     ibuprofen 200 MG tablet  Commonly known as:  ADVIL,MOTRIN  Take 400-600 mg by mouth every 6 (six) hours as needed for fever, headache or moderate pain.     Insulin Glargine 100 UNIT/ML Solostar Pen  Commonly known as:  LANTUS SOLOSTAR  Inject 34 Units into the skin daily. At 10AM     multivitamin tablet  Take 1 tablet by mouth daily with supper.     NOVOLOG FLEXPEN 100 UNIT/ML FlexPen  Generic drug:  insulin aspart  Inject 3 Units into the skin 3 (three) times daily with meals.     Pen Needles 3/16" 31G X 5 MM Misc  Use as directed.     pregabalin 75 MG capsule  Commonly known as:  LYRICA  Take 75 mg by mouth 4 (four) times daily.     SUBOXONE 8-2 MG Film  Generic drug:  Buprenorphine HCl-Naloxone HCl  Place 1 Film under the tongue 3 (three) times daily.        Discharge Instructions: Please refer to Patient Instructions section of EMR for full details.  Patient was counseled important signs and symptoms that should prompt return to medical care, changes in medications, dietary instructions, activity restrictions, and follow up appointments.   Follow-Up Appointments:   Marjie Skiff, MD 09/13/2015, 7:10 AM PGY-1, [place]

## 2015-09-13 NOTE — Interval H&P Note (Signed)
History and Physical Interval Note:  09/13/2015 3:33 PM  [name]  has presented today for surgery, with the diagnosis of wound dehiscence  The various methods of treatment have been discussed with the patient and family. After consideration of risks, benefits and other options for treatment, the patient has consented to  Procedure(s): AMPUTATION BELOW KNEE (Left) as a surgical intervention .  The patient's history has been reviewed, patient examined, no change in status, stable for surgery.  I have reviewed the patient's chart and labs.  Questions were answered to the patient's satisfaction.     Niley Helbig C

## 2015-09-13 NOTE — H&P (View-Only) (Signed)
Reason for Consult:post op draining left foot with partial wound separation Referring Physician: Mcdiarmid MD  [name] is an 60 y.o. female.  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 Sharol Given on 08/25/15.  She was suppose to be admitted Friday after being seen in our office but instead went out and did errands and did not go to the hospital. Has been walking on foot since she is too weak to NWB.  xrays no osteo. MRI just done shows questional areas and some post op fluid.  Pain better on IV ABX and foot has improved since yesterday.    Past Medical History  Diagnosis Date  . Hypertension   . COPD (chronic obstructive pulmonary disease) ([place])   . Shortness of breath   . Arthritis   . Diabetic neuropathy ([place])   . Rash, skin     face  . Kidney stone   . Sleep apnea     has not used cpap past 10 yrs  . Nocturia   . Urge incontinence   . Morbid obesity with BMI of 40.0-44.9, adult ([place])   . Renal insufficiency   . Diabetes mellitus without complication ([place])     INSULIN DEPENDENT  . Neuropathy ([place])   . Head and neck cancer ([place]) 04/19/2015  . Pneumonia   . Depression   . Anxiety   . Fibromyalgia   . Difficult intubation     total laryngectomy    Past Surgical History  Procedure Laterality Date  . Cholecystectomy    . Tubal ligation    . Carpel tunnel    . Cystoscopy w/ ureteral stent placement Left 03/22/2014    Procedure: CYSTOSCOPY WITH RETROGRADE PYELOGRAM/URETERAL STENT PLACEMENT;  Surgeon: Alexis Frock, MD;  Location: [facility];  Service: Urology;  Laterality: Left;  . Breast surgery  1982    LUMPECTOMY / benign  . Salivary stone removal    . Cystoscopy with retrograde pyelogram, ureteroscopy and stent placement Left 05/11/2014    Procedure: [place], URETEROSCOPY AND STENT PLACEMENT;  Surgeon: Alexis Frock, MD;  Location: [facility];  Service:  Urology;  Laterality: Left;  . Holmium laser application Left 2/69/4854    Procedure: HOLMIUM LASER APPLICATION;  Surgeon: Alexis Frock, MD;  Location: [facility];  Service: Urology;  Laterality: Left;  . Brain surgery      vertebral aneurysm  . Tonsillectomy    . Laryngectomy      total ( stoma)  . Amputation Left 08/25/2015    Procedure: 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;  Surgeon: Newt Minion, MD;  Location: [facility];  Service: Orthopedics;  Laterality: Left;    Family History  Problem Relation Age of Onset  . Adopted: Yes    Social History:  reports that she has quit smoking. Her smoking use included Cigarettes. She has a 40 pack-year smoking history. She has never used smokeless tobacco. She reports that she does not drink alcohol or use illicit drugs.  Allergies:  Allergies  Allergen Reactions  . Ace Inhibitors Anaphylaxis and Swelling    Angioedema  . Zestril [Lisinopril] Anaphylaxis and Swelling    Angioedema    Medications: I have reviewed the patient's current medications.  Results for orders placed or performed during the hospital encounter of 09/09/15 (from the past 48 hour(s))  Comprehensive metabolic panel     Status: Abnormal   Collection Time: 09/09/15  1:25  PM  Result Value Ref Range   Sodium 137 135 - 145 mmol/L   Potassium 3.4 (L) 3.5 - 5.1 mmol/L   Chloride 105 101 - 111 mmol/L   CO2 25 22 - 32 mmol/L   Glucose, Bld 154 (H) 65 - 99 mg/dL   BUN 17 6 - 20 mg/dL   Creatinine, Ser 1.15 (H) 0.44 - 1.00 mg/dL   Calcium 8.9 8.9 - 10.3 mg/dL   Total Protein 7.5 6.5 - 8.1 g/dL   Albumin 3.6 3.5 - 5.0 g/dL   AST 18 15 - 41 U/L   ALT 14 14 - 54 U/L   Alkaline Phosphatase 84 38 - 126 U/L   Total Bilirubin 0.5 0.3 - 1.2 mg/dL   GFR calc non Af Amer 52 (L) >60 mL/min   GFR calc Af Amer >60 >60 mL/min    Comment: (NOTE) The eGFR has been calculated using the CKD EPI equation. This calculation has not been validated in all clinical situations. eGFR's persistently <60  mL/min signify possible Chronic Kidney Disease.    Anion gap 7 5 - 15  CBC WITH DIFFERENTIAL     Status: None   Collection Time: 09/09/15  1:25 PM  Result Value Ref Range   WBC 9.3 4.0 - 10.5 K/uL   RBC 4.49 3.87 - 5.11 MIL/uL   Hemoglobin 12.2 12.0 - 15.0 g/dL   HCT 38.2 36.0 - 46.0 %   MCV 85.1 78.0 - 100.0 fL   MCH 27.2 26.0 - 34.0 pg   MCHC 31.9 30.0 - 36.0 g/dL   RDW 13.7 11.5 - 15.5 %   Platelets 256 150 - 400 K/uL   Neutrophils Relative % 72 %   Neutro Abs 6.7 1.7 - 7.7 K/uL   Lymphocytes Relative 21 %   Lymphs Abs 1.9 0.7 - 4.0 K/uL   Monocytes Relative 5 %   Monocytes Absolute 0.5 0.1 - 1.0 K/uL   Eosinophils Relative 2 %   Eosinophils Absolute 0.2 0.0 - 0.7 K/uL   Basophils Relative 0 %   Basophils Absolute 0.0 0.0 - 0.1 K/uL  Blood Culture (routine x 2)     Status: None (Preliminary result)   Collection Time: 09/09/15  1:25 PM  Result Value Ref Range   Specimen Description BLOOD RIGHT FOREARM    Special Requests BOTTLES DRAWN AEROBIC AND ANAEROBIC 5CC    Culture NO GROWTH 1 DAY    Report Status PENDING   Blood Culture (routine x 2)     Status: None (Preliminary result)   Collection Time: 09/09/15  1:36 PM  Result Value Ref Range   Specimen Description BLOOD LEFT FOREARM    Special Requests IN PEDIATRIC BOTTLE 5CC    Culture NO GROWTH 1 DAY    Report Status PENDING   I-Stat CG4 Lactic Acid, ED  (not at  [facility])     Status: Abnormal   Collection Time: 09/09/15  1:47 PM  Result Value Ref Range   Lactic Acid, Venous 1.91 (HH) 0.5 - 1.9 mmol/L  Urinalysis, Routine w reflex microscopic (not at [facility])     Status: None   Collection Time: 09/09/15  2:27 PM  Result Value Ref Range   Color, Urine YELLOW YELLOW   APPearance CLEAR CLEAR   Specific Gravity, Urine 1.019 1.005 - 1.030   pH 6.0 5.0 - 8.0   Glucose, UA NEGATIVE NEGATIVE mg/dL   Hgb urine dipstick NEGATIVE NEGATIVE   Bilirubin Urine NEGATIVE NEGATIVE   Ketones, ur NEGATIVE  NEGATIVE mg/dL   Protein, ur  NEGATIVE NEGATIVE mg/dL   Nitrite NEGATIVE NEGATIVE   Leukocytes, UA NEGATIVE NEGATIVE    Comment: MICROSCOPIC NOT DONE ON URINES WITH NEGATIVE PROTEIN, BLOOD, LEUKOCYTES, NITRITE, OR GLUCOSE <1000 mg/dL.  Urine culture     Status: Abnormal   Collection Time: 09/09/15  2:27 PM  Result Value Ref Range   Specimen Description URINE, RANDOM    Special Requests NONE    Culture MULTIPLE SPECIES PRESENT, SUGGEST RECOLLECTION (A)    Report Status 09/10/2015 FINAL   I-Stat CG4 Lactic Acid, ED  (not at  [facility])     Status: None   Collection Time: 09/09/15  4:01 PM  Result Value Ref Range   Lactic Acid, Venous 1.80 0.5 - 1.9 mmol/L  Protime-INR     Status: None   Collection Time: 09/09/15  6:30 PM  Result Value Ref Range   Prothrombin Time 14.8 11.6 - 15.2 seconds   INR 1.14 0.00 - 1.49  Glucose, capillary     Status: Abnormal   Collection Time: 09/09/15 10:23 PM  Result Value Ref Range   Glucose-Capillary 122 (H) 65 - 99 mg/dL  C-reactive protein     Status: Abnormal   Collection Time: 09/09/15 11:25 PM  Result Value Ref Range   CRP 7.7 (H) <1.0 mg/dL  Basic metabolic panel     Status: Abnormal   Collection Time: 09/10/15  3:32 AM  Result Value Ref Range   Sodium 141 135 - 145 mmol/L   Potassium 3.8 3.5 - 5.1 mmol/L   Chloride 109 101 - 111 mmol/L   CO2 27 22 - 32 mmol/L   Glucose, Bld 118 (H) 65 - 99 mg/dL   BUN 15 6 - 20 mg/dL   Creatinine, Ser 1.16 (H) 0.44 - 1.00 mg/dL   Calcium 8.5 (L) 8.9 - 10.3 mg/dL   GFR calc non Af Amer 51 (L) >60 mL/min   GFR calc Af Amer 59 (L) >60 mL/min    Comment: (NOTE) The eGFR has been calculated using the CKD EPI equation. This calculation has not been validated in all clinical situations. eGFR's persistently <60 mL/min signify possible Chronic Kidney Disease.    Anion gap 5 5 - 15  CBC     Status: Abnormal   Collection Time: 09/10/15  3:32 AM  Result Value Ref Range   WBC 6.9 4.0 - 10.5 K/uL   RBC 4.11 3.87 - 5.11 MIL/uL   Hemoglobin 11.0  (L) 12.0 - 15.0 g/dL   HCT 35.1 (L) 36.0 - 46.0 %   MCV 85.4 78.0 - 100.0 fL   MCH 26.8 26.0 - 34.0 pg   MCHC 31.3 30.0 - 36.0 g/dL   RDW 14.0 11.5 - 15.5 %   Platelets 219 150 - 400 K/uL  Lipid panel     Status: Abnormal   Collection Time: 09/10/15  3:32 AM  Result Value Ref Range   Cholesterol 176 0 - 200 mg/dL   Triglycerides 85 <150 mg/dL   HDL 59 >40 mg/dL   Total CHOL/HDL Ratio 3.0 RATIO   VLDL 17 0 - 40 mg/dL   LDL Cholesterol 100 (H) 0 - 99 mg/dL    Comment:        Total Cholesterol/HDL:CHD Risk Coronary Heart Disease Risk Table                     Men   Women  1/2 Average Risk   3.4   3.3  Average Risk       5.0   4.4  2 X Average Risk   9.6   7.1  3 X Average Risk  23.4   11.0        Use the calculated Patient Ratio above and the CHD Risk Table to determine the patient's CHD Risk.        ATP III CLASSIFICATION (LDL):  <100     mg/dL   Optimal  100-129  mg/dL   Near or Above                    Optimal  130-159  mg/dL   Borderline  160-189  mg/dL   High  >190     mg/dL   Very High   Glucose, capillary     Status: Abnormal   Collection Time: 09/10/15  6:04 AM  Result Value Ref Range   Glucose-Capillary 120 (H) 65 - 99 mg/dL  Glucose, capillary     Status: None   Collection Time: 09/10/15 11:22 AM  Result Value Ref Range   Glucose-Capillary 98 65 - 99 mg/dL   Comment 1 Notify RN   Glucose, capillary     Status: Abnormal   Collection Time: 09/10/15  4:34 PM  Result Value Ref Range   Glucose-Capillary 113 (H) 65 - 99 mg/dL   Comment 1 Notify RN   Glucose, capillary     Status: Abnormal   Collection Time: 09/10/15  9:17 PM  Result Value Ref Range   Glucose-Capillary 129 (H) 65 - 99 mg/dL    Mr Foot Left Wo Contrast  09/10/2015  CLINICAL DATA:  Amputation 10 days ago.  Nonhealing wound. EXAM: MRI OF THE LEFT FOREFOOT WITHOUT CONTRAST TECHNIQUE: Multiplanar, multisequence MR imaging was performed. No intravenous contrast was administered. COMPARISON:  None.  FINDINGS: Bones/Joint/Cartilage Amputation of the fifth ray. Severe soft tissue swelling at the amputation site with a small amount air adjacent to the distal lateral aspect of the cuboid with cortical irregularity concerning for osteomyelitis of the cuboid. Marrow edema at the base of the fourth metatarsal without definite cortical destruction which may reflect reactive marrow edema versus early mild osteomyelitis. Posttraumatic deformity of the fourth metatarsal. No acute fracture or dislocation.  No joint effusion. Ligaments Collateral ligaments are intact. Tendons Flexor, peroneal and extensor compartment tendons are intact. Muscles Generalized edema within the plantar musculature likely neurogenic given the patient's history diabetes. Soft tissue Soft tissue wound along the lateral aspect of the midfoot with severe underlying soft tissue edema. 1.3 x 1.9 cm T2 hyperintense fluid collection along the lateral aspect of the fourth metatarsal concerning for an abscess. IMPRESSION: 1. Amputation of the fifth ray. Severe soft tissue swelling at the amputation site with a small amount air adjacent to the distal lateral aspect of the cuboid with cortical irregularity concerning for osteomyelitis of the cuboid. 2. Soft tissue wound along the lateral aspect of the midfoot with severe underlying soft tissue edema concerning for extensive cellulitis. 1.3 x 1.9 cm T2 hyperintense fluid collection along the lateral aspect of the fourth metatarsal concerning for an abscess. 3. Marrow edema at the base of the fourth metatarsal without definite cortical destruction which may reflect reactive marrow edema versus early mild osteomyelitis. Electronically Signed   By: Kathreen Devoid   On: 09/10/2015 20:08   Dg Chest Port 1 View  09/09/2015  CLINICAL DATA:  Shortness of breath. EXAM: PORTABLE CHEST 1 VIEW COMPARISON:  Radiographs of March 02, 2015.  FINDINGS: The heart size and mediastinal contours are within normal limits. Both  lungs are clear. No pneumothorax or pleural effusion is noted. The visualized skeletal structures are unremarkable. IMPRESSION: No acute cardiopulmonary abnormality seen. Electronically Signed   By: Marijo Conception, M.D.   On: 09/09/2015 14:09   Dg Foot Complete Left  09/09/2015  CLINICAL DATA:  Recent surgical amputation of fifth toe. EXAM: LEFT FOOT - COMPLETE 3+ VIEW COMPARISON:  Radiograph August 21, 2015. FINDINGS: Status post interval surgical amputation of the fifth metatarsal and phalanges. Old healed fourth metatarsal fracture is noted. Soft tissue gas and swelling is seen overlying amputation site most consistent with postoperative findings, but inflammation or infection cannot be excluded. No lytic destruction is seen at this time to suggest acute osteomyelitis. IMPRESSION: Status post interval surgical amputation of fifth metatarsal and phalanges with overlying soft tissue gas and swelling most consistent with expected postoperative findings, but acute inflammation or infection cannot be excluded. No definite evidence of osteomyelitis is seen at this time. Electronically Signed   By: Marijo Conception, M.D.   On: 09/09/2015 14:15    Review of Systems  Constitutional: Positive for fever and chills.  HENT:       S/p larngectomy  Eyes: Negative for blurred vision.  Cardiovascular: Negative for chest pain.  Gastrointestinal: Negative for nausea and vomiting.  Skin: Negative for rash.  Neurological: Negative for headaches.  Psychiatric/Behavioral: The patient is not nervous/anxious.    Blood pressure 133/67, pulse 75, temperature 99.2 F (37.3 C), temperature source Oral, resp. rate 16, SpO2 96 %. Physical Exam  Neck:  larnengectomy scar  Cardiovascular: Normal rate and regular rhythm.   Respiratory: Effort normal. She has rales.  GI: Soft. Bowel sounds are normal.  Psychiatric: She has a normal mood and affect. Thought content normal.  Did not go to hospital for admission as instructed  on Friday- noncompliant.  States vending machines in ER broken and she felt her sugar was low so she left.    Assessment/Plan: Will continue to follow.  If not continuing to improve will post for Surgery as add on case on Tuesday evening for wash out and VAC placement.  MRI shows no definite osteo.  Needs darco shoe which I will order and needs to keep foot elevated which I have reminded her again tonight. Will follow with you. Dr. Sharol Given out till end of week. My cell [phone]  Marybelle Killings 09/10/2015, 10:45 PM

## 2015-09-13 NOTE — Progress Notes (Signed)
Stoma is clean and dry, no redness noted. Patient on RA with no complications, BS are clear and diminished.

## 2015-09-13 NOTE — Progress Notes (Signed)
Spoke with Dr Lorin Mercy he told me to hold Ms Neveah Bang prior to surgery

## 2015-09-13 NOTE — Progress Notes (Signed)
Called short stay to give report

## 2015-09-13 NOTE — Anesthesia Postprocedure Evaluation (Signed)
Anesthesia Post Note  Patient: [name]  Procedure(s) Performed: Procedure(s) (LRB): AMPUTATION BELOW KNEE (Left)  Patient location during evaluation: PACU Anesthesia Type: General Level of consciousness: awake and alert Pain management: pain level controlled Vital Signs Assessment: post-procedure vital signs reviewed and stable Respiratory status: spontaneous breathing, nonlabored ventilation, respiratory function stable and patient connected to nasal cannula oxygen Cardiovascular status: blood pressure returned to baseline and stable Postop Assessment: no signs of nausea or vomiting Anesthetic complications: no    Last Vitals:  Filed Vitals:   09/13/15 1950 09/13/15 2009  BP:  131/69  Pulse:  81  Temp: 36.4 C   Resp:      Last Pain:  Filed Vitals:   09/13/15 2012  PainSc: [place] DAVID

## 2015-09-13 NOTE — Anesthesia Preprocedure Evaluation (Addendum)
Anesthesia Evaluation  Patient identified by MRN, date of birth, ID band Patient awake    Reviewed: Allergy & Precautions, NPO status , Patient's Chart, lab work & pertinent test results  History of Anesthesia Complications (+) DIFFICULT AIRWAY and history of anesthetic complications  Airway Mallampati: Trach       Dental no notable dental hx.    Pulmonary shortness of breath, sleep apnea , COPD,  COPD inhaler, former smoker,    breath sounds clear to auscultation       Cardiovascular hypertension, + Peripheral Vascular Disease   Rhythm:Regular Rate:Normal     Neuro/Psych PSYCHIATRIC DISORDERS Anxiety Depression  Neuromuscular disease    GI/Hepatic negative GI ROS, Neg liver ROS,   Endo/Other  diabetesMorbid obesity  Renal/GU Renal InsufficiencyRenal disease     Musculoskeletal  (+) Arthritis , Fibromyalgia -  Abdominal   Peds  Hematology   Anesthesia Other Findings   Reproductive/Obstetrics                          Anesthesia Physical Anesthesia Plan  ASA: III  Anesthesia Plan: Regional and General   Post-op Pain Management:    Induction: Intravenous  Airway Management Planned:   Additional Equipment: None  Intra-op Plan:   Post-operative Plan:   Informed Consent: I have reviewed the patients History and Physical, chart, labs and discussed the procedure including the risks, benefits and alternatives for the proposed anesthesia with the patient or authorized representative who has indicated his/her understanding and acceptance.   Dental advisory given  Plan Discussed with: CRNA and Surgeon  Anesthesia Plan Comments: (Tracheal ett)       Anesthesia Quick Evaluation

## 2015-09-13 NOTE — Progress Notes (Signed)
Family Medicine Teaching Service Daily Progress Note Intern Pager: [phone]  Patient name: [name] Medical record number: [id number] Date of birth: 1956-01-24 Age: 60 y.o. Gender: female  Primary Care Provider: Kerin Perna, NP Consultants: orthopedics Code Status: full  Pt Overview and Major Events to Date:  7/15: admitted for IV antibiotic for diabetic foot ulcer drainage  Assessment and Plan: [name] is a 60 y.o. female presenting with pain and drainage from her left foot s/p fifth metatarsal amputation on (6/30). 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  #Left foot pain s/p left foot fifth metatarsal raw amputation on 6/30:  Patient was seen by Dr. Lorin Mercy from orthopedic surgery. Scheduled for surgery this afternoon. Wound dehiscence noted by surgery on exam  --Continue Vancomycin and zosyn (7/15>) --Follow-up blood and urine cultures (collected in ED) --Elevate foot --Darco shoe ordered by ortho 7/17 --Ortho following-appreciate recs  DM type II/Diabetic neuropathy: CBG 89 this morning. on Lantus 34 units at home, seemed to be well controlled. Last A1c was 6.0 on 6/30.  -Lantus 17 units + SSI -CBG qACHS -Continue Pregabalin 75 mg for neuropathy -Lipid panel. At risk for ASCVD.  CKD III, at baseline: Creatinine 1.16 (at baseline). Status post 3 L normal saline in ED -BMP as needed  Essential Hypertension: normotensive. -Continue home amlodipine  Asthma: Stable -Continue albuterol q4 prn  Chronic Pain 2/2 knee and back pain: On Suboxone 8-2 milligrams -Will continue buprenorphine '8mg'$  per pharmacy's recommendation  FEN/GI:  -Carb modified -Saline lock  Prophylaxis: Lovenox  Disposition: inpatient observation pending assessment by orthopedics   Subjective:  Patient doing fine this morning given impending BKA this afternoon. Patient does not have any complains at the time. She denies  fever, chills, nausea, vomiting, abdominal and chest pain.  Objective: Temp:  [98.3 F (36.8 C)-98.9 F (37.2 C)] 98.5 F (36.9 C) (07/19 0636) Pulse Rate:  [62-72] 72 (07/19 0636) Resp:  [16-18] 16 (07/19 0636) BP: (113-131)/(59-65) 127/59 mmHg (07/19 0636) SpO2:  [92 %-98 %] 96 % (07/19 0850) Physical Exam: General: obese female sitting on side of bed; pleasant, in NAD Eyes: PERRLA, EOMI, no scleral icterus ENTM: MMM, no oropharyngeal erythema or exudates Neck: normal ROM, no JVD, supple, tracheotomy valve Cardiovascular: RRR, normal S1 and S2, no murmurs, rubs or gallops appreciated Respiratory: CTAB, normal WOB on RA  Abdomen: non distended, non-tender, +BS MSK: bilateral non pitting edema; left foot swelling s/p left fifth metatarsal amputation, with some drainage and mild erythema Skin: Lower extremities bilateral hyperpigmentation 2/2 to venous congestion  Neuro: A&Ox3, no focal deficits Psych: appropriate mood and affect  Laboratory:  Recent Labs Lab 09/09/15 1325 09/10/15 0332 09/11/15 0303  WBC 9.3 6.9 7.9  HGB 12.2 11.0* 12.3  HCT 38.2 35.1* 39.0  PLT 256 219 255    Recent Labs Lab 09/08/15 1704 09/09/15 1325 09/10/15 0332 09/13/15 0406  NA 139 137 141 141  K 3.5 3.4* 3.8 4.4  CL 103 105 109 104  CO2 '28 25 27 27  '$ BUN '20 17 15 17  '$ CREATININE 1.32* 1.15* 1.16* 1.32*  CALCIUM 9.3 8.9 8.5* 8.9  PROT 6.9 7.5  --   --   BILITOT 0.4 0.5  --   --   ALKPHOS 89 84  --   --   ALT 15 14  --   --   AST 17 18  --   --   GLUCOSE  115* 154* 118* 89   176         Cholesterol             Triglycerides        85         Triglycerides    HDL        59         HDL    LDL Cholesterol        100         LDL Cholesterol    VLDL        17         VLDL    Total CHOL/HDL Ratio                            Imaging/Diagnostic Tests: Dg Chest Port 1 View  09/09/2015   IMPRESSION: No acute cardiopulmonary abnormality seen. Electronically Signed   By: Marijo Conception, M.D.   On: 09/09/2015 14:09   Dg Foot Complete Left  09/09/2015  IMPRESSION: Status post interval surgical amputation of fifth metatarsal and phalanges with overlying soft tissue gas and swelling most consistent with expected postoperative findings, but acute inflammation or infection cannot be excluded. No definite evidence of osteomyelitis is seen at this time. Electronically Signed   By: Marijo Conception, M.D.   On: 09/09/2015 14:15    Marjie Skiff, MD 09/13/2015, 9:29 AM PGY-1, [place] Intern pager: [phone], text pages welcome

## 2015-09-13 NOTE — Progress Notes (Signed)
Shift notes: Patient is alert, awake, resting in the bed, no acute distress noted, no complains of pain or discomfort, trach. care completed by respiratory therapist, no respiratory distress noted, able to verbalize her needs, son at bedside, dressing to left foot changed, old dressing with moderate amount serosanguinous drainage, sutures intact to left foot incision, site cleaned with NS and covered with dry sterile 4x4 and kerlix roll, patient tolerated well. Son at bedside.

## 2015-09-13 NOTE — Transfer of Care (Signed)
Immediate Anesthesia Transfer of Care Note  Patient: [name]  Procedure(s) Performed: Procedure(s): AMPUTATION BELOW KNEE (Left)  Patient Location: PACU  Anesthesia Type:GA combined with regional for post-op pain  Level of Consciousness: awake, alert , oriented and patient cooperative  Airway & Oxygen Therapy: Patient Spontanous Breathing and Patient connected to tracheostomy mask oxygen  Post-op Assessment: report given to RN, post op vital signs reviewed and stable  Post vital signs: Reviewed and stable  Last Vitals:  Filed Vitals:   09/13/15 0446 09/13/15 0636  BP:  127/59  Pulse: 67 72  Temp:  36.9 C  Resp: 18 16    Last Pain:  Filed Vitals:   09/13/15 1418  PainSc: 0-No pain      Patients Stated Pain Goal: 1 (04/59/97 7414)  Complications: No apparent anesthesia complications

## 2015-09-14 ENCOUNTER — Encounter (HOSPITAL_COMMUNITY): Payer: Self-pay | Admitting: Orthopaedic Surgery

## 2015-09-14 LAB — BASIC METABOLIC PANEL
Anion gap: 4 — ABNORMAL LOW (ref 5–15)
BUN: 15 mg/dL (ref 6–20)
CALCIUM: 8.4 mg/dL — AB (ref 8.9–10.3)
CO2: 30 mmol/L (ref 22–32)
CREATININE: 1.22 mg/dL — AB (ref 0.44–1.00)
Chloride: 106 mmol/L (ref 101–111)
GFR calc non Af Amer: 48 mL/min — ABNORMAL LOW (ref >60)
GFR, EST AFRICAN AMERICAN: 56 mL/min — AB (ref >60)
Glucose, Bld: 122 mg/dL — ABNORMAL HIGH (ref 65–99)
Potassium: 3.8 mmol/L (ref 3.5–5.1)
SODIUM: 140 mmol/L (ref 135–145)

## 2015-09-14 LAB — GLUCOSE, CAPILLARY
GLUCOSE-CAPILLARY: 106 mg/dL — AB (ref 65–99)
GLUCOSE-CAPILLARY: 121 mg/dL — AB (ref 65–99)
Glucose-Capillary: 156 mg/dL — ABNORMAL HIGH (ref 65–99)
Glucose-Capillary: 169 mg/dL — ABNORMAL HIGH (ref 65–99)

## 2015-09-14 LAB — CULTURE, BLOOD (ROUTINE X 2)
CULTURE: NO GROWTH
CULTURE: NO GROWTH

## 2015-09-14 MED ORDER — BACLOFEN 10 MG PO TABS
5.0000 mg | ORAL_TABLET | Freq: Three times a day (TID) | ORAL | Status: DC
  Administered 2015-09-14 – 2015-09-15 (×4): 5 mg via ORAL
  Filled 2015-09-14 (×4): qty 1

## 2015-09-14 MED ORDER — SENNA 8.6 MG PO TABS
1.0000 | ORAL_TABLET | Freq: Every day | ORAL | Status: DC | PRN
  Administered 2015-09-14 – 2015-09-15 (×2): 8.6 mg via ORAL
  Filled 2015-09-14 (×2): qty 1

## 2015-09-14 MED ORDER — HYDROMORPHONE HCL 1 MG/ML IJ SOLN
1.0000 mg | INTRAMUSCULAR | Status: DC | PRN
  Administered 2015-09-14 – 2015-09-15 (×7): 1 mg via INTRAVENOUS
  Filled 2015-09-14 (×7): qty 1

## 2015-09-14 MED ORDER — OXYCODONE-ACETAMINOPHEN 5-325 MG PO TABS
1.0000 | ORAL_TABLET | Freq: Four times a day (QID) | ORAL | Status: DC
  Administered 2015-09-14 – 2015-09-15 (×6): 1 via ORAL
  Filled 2015-09-14 (×6): qty 1

## 2015-09-14 NOTE — Progress Notes (Signed)
Family Medicine Teaching Service Daily Progress Note Intern Pager: [phone]  Patient name: [name] Medical record number: [id number] Date of birth: 1955-07-07 Age: 60 y.o. Gender: female  Primary Care Provider: Kerin Perna, NP Consultants: orthopedics Code Status: full  Pt Overview and Major Events to Date:  7/15: admitted for IV antibiotic for diabetic foot ulcer drainage  Assessment and Plan: [name] is a 60 y.o. female presenting with pain and drainage from her left foot s/p fifth metatarsal amputation on (6/30). 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 No fever overnight, patient vitals have been within normal limit. Will continue to monitor.  --Started on Vancomycin 750 mg IV and zosyn 3.375g started on 7/19- today (7/20) day 2   --Ortho following-appreciate recs  DM type II/Diabetic neuropathy: CBG 106 this morning. on Lantus 34 units at home, seemed to be well controlled. Last A1c was 6.0 on 6/30.  -Lantus 17 units + SSI -CBG qACHS -Continue Pregabalin 75 mg for neuropathy -Lipid panel. At risk for ASCVD.  CKD III, at baseline: Creatinine 1.16 (at baseline), this morning patient creatinine is 1.22 down from 1.32 yesterday. Patient received good volume with IVF for BKA. --f/u am BMP  Essential Hypertension: normotensive. -Continue home amlodipine  Asthma: Stable -Continue albuterol q4 prn  Chronic Pain 2/2 knee and back pain: On Suboxone 8-2 milligrams -Will continue buprenorphine '8mg'$  per pharmacy's recommendation  FEN/GI:  -Carb modified -Saline lock  Prophylaxis: Lovenox  Disposition: Admit to inpatient service for antibiotics s/p BKA  Subjective:  Patient endorses some pain this morning after left BKA yesterday. Patient complains of  the metal plate around her stump. Patient will talk to Dr. Lorin Mercy from ortho for other possible options. Patient denies abdominal pain, fever, chills, shortness of breath and diarrhea.  Objective: Temp:  [97.5 F (36.4 C)-99.8 F (37.7 C)] 98.2 F (36.8 C) (07/20 0540) Pulse Rate:  [72-92] 74 (07/20 0540) Resp:  [12-16] 16 (07/20 0008) BP: (106-131)/(61-87) 109/64 mmHg (07/20 0540) SpO2:  [92 %-100 %] 96 % (07/20 0540) Physical Exam: General: obese female sitting on side of bed; pleasant, in NAD Eyes: PERRLA, EOMI, no scleral icterus ENTM: MMM, no oropharyngeal erythema or exudates Neck: normal ROM, no JVD, supple, tracheotomy valve Cardiovascular: RRR, normal S1 and S2, no murmurs, rubs or gallops appreciated Respiratory: CTAB, normal WOB on RA  Abdomen: non distended, non-tender, +BS MSK: bilateral non pitting edema; left BKA Skin: Right lower leg hyperpigmentation 2/2 to venous congestion  Neuro: A&Ox3, no focal deficits Psych: appropriate mood and affect  Laboratory:  Recent Labs Lab 09/09/15 1325 09/10/15 0332 09/11/15 0303  WBC 9.3 6.9 7.9  HGB 12.2 11.0* 12.3  HCT 38.2 35.1* 39.0  PLT 256 219 255    Recent Labs Lab 09/08/15 1704 09/09/15 1325 09/10/15 0332 09/13/15 0406 09/14/15 0333  NA 139 137 141 141 140  K 3.5 3.4* 3.8 4.4 3.8  CL 103 105 109 104 106  CO2 '28 25 27 27 30  '$ BUN '20 17 15 17 15  '$ CREATININE 1.32* 1.15* 1.16* 1.32* 1.22*  CALCIUM 9.3 8.9 8.5* 8.9 8.4*  PROT 6.9 7.5  --   --   --   BILITOT 0.4 0.5  --   --   --   ALKPHOS 89 84  --   --   --  ALT 15 14  --   --   --   AST 17 18  --   --   --   GLUCOSE 115* 154* 118* 89 122*   176         Cholesterol             Triglycerides        85         Triglycerides    HDL        59         HDL    LDL Cholesterol        100         LDL Cholesterol    VLDL        17         VLDL    Total CHOL/HDL Ratio                          Imaging/Diagnostic Tests: Dg Chest Port 1 View  09/09/2015    IMPRESSION: No acute cardiopulmonary abnormality seen. Electronically Signed   By: Marijo Conception, M.D.   On: 09/09/2015 14:09   Dg Foot Complete Left  09/09/2015  IMPRESSION: Status post interval surgical amputation of fifth metatarsal and phalanges with overlying soft tissue gas and swelling most consistent with expected postoperative findings, but acute inflammation or infection cannot be excluded. No definite evidence of osteomyelitis is seen at this time. Electronically Signed   By: Marijo Conception, M.D.   On: 09/09/2015 14:15    Marjie Skiff, MD 09/14/2015, 7:43 AM PGY-1, [place] [facility] Intern pager: [phone], text pages welcome

## 2015-09-14 NOTE — Evaluation (Signed)
Physical Therapy Evaluation Patient Details Name: [name] MRN: [id number] DOB: 1955-03-23 Today's Date: 09/14/2015   History of Present Illness  Pt is a 60 year old female admitted with sepsis and non healing wounds on L LE. Pt s/p L BKA 09/13/15. PMH: DM, HTN, COPD, head and neck cancer, total laryngectomy  Clinical Impression  Patient is s/p above surgery resulting in functional limitations due to the deficits listed below (see PT Problem List).  Patient will benefit from skilled PT to increase their independence and safety with mobility to allow discharge to the venue listed below.  Pt reports she was transferring and ambulating short distance/performing a couple steps prior to admission.  Pt very motivated and her goal is to achieve L LE prosthesis.  Pt reports weakness in R LE and that she has been told she needs bil TKA and R hip replacements.       Follow Up Recommendations CIR;Supervision for mobility/OOB    Equipment Recommendations  None recommended by PT    Recommendations for Other Services       Precautions / Restrictions Precautions Precautions: Fall Required Braces or Orthoses: Knee Immobilizer - Left Knee Immobilizer - Left: On at all times Other Brace/Splint: adjusted metal stay to lateral side of knee to decrease pressure and pain on end of L LE Restrictions Weight Bearing Restrictions: No LLE Weight Bearing: Non weight bearing      Mobility  Bed Mobility Overal bed mobility: Needs Assistance Bed Mobility: Supine to Sit     Supine to sit: Supervision;HOB elevated     General bed mobility comments: with rail  Transfers Overall transfer level: Needs assistance Equipment used: Rolling walker (2 wheeled) Transfers: Sit to/from Stand;Lateral/Scoot Transfers Sit to Stand: +2 physical assistance;Mod assist        Lateral/Scoot Transfers: Min guard;From elevated surface General transfer comment: pt held onto RW and unable to achieve full standing  position due to weakness per pt, set up room for lateral/scoot transfer from bed to drop arm commode to drop arm recliner  Ambulation/Gait                Stairs            Wheelchair Mobility    Modified Rankin (Stroke Patients Only)       Balance Overall balance assessment: Needs assistance   Sitting balance-Leahy Scale: Fair       Standing balance-Leahy Scale: Zero                               Pertinent Vitals/Pain Pain Assessment: 0-10 Pain Score: 8  Pain Location: L residual limb Pain Descriptors / Indicators: Aching;Discomfort Pain Intervention(s): Limited activity within patient's tolerance;Monitored during session;Patient requesting pain meds-RN notified;Repositioned    Home Living Family/patient expects to be discharged to:: Private residence Living Arrangements: Spouse/significant other;Children (adult son) Available Help at Discharge: Family;Available 24 hours/day Type of Home: House Home Access: Stairs to enter Entrance Stairs-Rails: None Entrance Stairs-Number of Steps: 1 Home Layout: One level (one step to get down into kitchen) Home Equipment: Walker - 2 wheels;Wheelchair - [employer];Shower seat;Hand held shower head;Adaptive equipment      Prior Function Level of Independence: Needs assistance   Gait / Transfers Assistance Needed: was primarily w/c dependent due to NWB status on L LE and weakness in R LE   ADL's / Homemaking Assistance Needed: set up for sponge bathing and dressing leaning side to  side, assist for IADL        Hand Dominance   Dominant Hand: Right    Extremity/Trunk Assessment   Upper Extremity Assessment: Generalized weakness           Lower Extremity Assessment: RLE deficits/detail;LLE deficits/detail RLE Deficits / Details: unable to achieve full PROM extension - lacking approx 5* LLE Deficits / Details: pt reports spinal not fully worn off yet, poor quad set, able to peform full  PROM extension, adjusted KI for comfort, pt aware to wear at all times     Communication   Communication: Expressive difficulties (electrolarynx)  Cognition Arousal/Alertness: Awake/alert Behavior During Therapy: WFL for tasks assessed/performed Overall Cognitive Status: Within Functional Limits for tasks assessed                      General Comments      Exercises General Exercises - Lower Extremity Quad Sets: AROM;Both;5 reps      Assessment/Plan    PT Assessment Patient needs continued PT services  PT Diagnosis Difficulty walking;Generalized weakness   PT Problem List Decreased strength;Decreased activity tolerance;Decreased mobility;Pain;Decreased knowledge of precautions;Decreased range of motion  PT Treatment Interventions DME instruction;Gait training;Balance training;Functional mobility training;Patient/family education;Therapeutic activities;Wheelchair mobility training;Therapeutic exercise   PT Goals (Current goals can be found in the Care Plan section) Acute Rehab PT Goals Patient Stated Goal: use a prosthesis when L LE heals PT Goal Formulation: With patient Time For Goal Achievement: 09/21/15 Potential to Achieve Goals: Good    Frequency Min 4X/week   Barriers to discharge        Co-evaluation PT/OT/SLP Co-Evaluation/Treatment: Yes Reason for Co-Treatment: For patient/therapist safety PT goals addressed during session: Mobility/safety with mobility;Proper use of DME         End of Session Equipment Utilized During Treatment: Left knee immobilizer Activity Tolerance: Patient tolerated treatment well Patient left: in chair;with call bell/phone within reach;with nursing/sitter in room;with family/visitor present Nurse Communication: Mobility status;Precautions         Time: 0912-1000 PT Time Calculation (min) (ACUTE ONLY): 48 min   Charges:   PT Evaluation $PT Eval Moderate Complexity: 1 Procedure PT Treatments $Therapeutic Activity:  8-22 mins   PT G Codes:        Tanja Gift,KATHrine E 09/14/2015, 12:27 PM Carmelia Bake, PT, DPT 09/14/2015 Pager: [phone]

## 2015-09-14 NOTE — Progress Notes (Signed)
Pt refuses lyrica, states that it makes her stump jittery and is causing muscle spasms. Called for order for a muscle relaxer.

## 2015-09-14 NOTE — Evaluation (Signed)
Occupational Therapy Evaluation Patient Details Name: [name] MRN: [id number] DOB: 25-Sep-1955 Today's Date: 09/14/2015    History of Present Illness Pt admitted with sepsis and non healing wounds on L LE. Now s/p L BKA. PMH: DM, HTN, COPD, head and neck cancer.   Clinical Impression   Pt was able to sponge bathe and dress with set up, she was assisted with IADL prior to admission. She reports weakness in R LE and was unable to maintain NWB consistently so primarily has been using a manual w/c at home. Instructed pt with lateral transfers to [facility]. Needs 2 person assist to attempt standing. Recommending inpatient rehab prior to return home with her supportive family. Will follow.    Follow Up Recommendations  CIR    Equipment Recommendations   (drop arm commode)    Recommendations for Other Services Rehab consult     Precautions / Restrictions Precautions Precautions: Fall Required Braces or Orthoses: Knee Immobilizer - Left Knee Immobilizer - Left: On at all times Other Brace/Splint: adjusted metal stay to lateral side of knee to decrease pressure and pain on end of L LE Restrictions Weight Bearing Restrictions: No LLE Weight Bearing: Non weight bearing      Mobility Bed Mobility Overal bed mobility: Needs Assistance Bed Mobility: Supine to Sit     Supine to sit: Supervision;HOB elevated     General bed mobility comments: with rail  Transfers Overall transfer level: Needs assistance Equipment used: Rolling walker (2 wheeled) Transfers: Sit to/from Stand Sit to Stand: +2 physical assistance;Mod assist         General transfer comment: did not achieve full standing position    Balance Overall balance assessment: Needs assistance   Sitting balance-Leahy Scale: Fair       Standing balance-Leahy Scale: Zero                              ADL Overall ADL's : Needs assistance/impaired Eating/Feeding: Independent;Bed level   Grooming:  Sitting;Supervision/safety   Upper Body Bathing: Sitting;Minimal assitance   Lower Body Bathing: Sitting/lateral leans;Moderate assistance   Upper Body Dressing : Supervision/safety;Sitting   Lower Body Dressing: Moderate assistance;Sit to/from stand   Toilet Transfer: Min guard;Requires drop arm;Cueing for sequencing;Cueing for safety (lateral transfer)   Toileting- Clothing Manipulation and Hygiene: Supervision/safety;Sitting/lateral lean               Vision     Perception     Praxis      Pertinent Vitals/Pain Pain Assessment: 0-10 Pain Score: 8  Pain Location: L LE Pain Descriptors / Indicators: Aching Pain Intervention(s): Monitored during session;Repositioned     Hand Dominance Right   Extremity/Trunk Assessment Upper Extremity Assessment Upper Extremity Assessment: Generalized weakness   Lower Extremity Assessment Lower Extremity Assessment: Defer to PT evaluation       Communication Communication Communication: Expressive difficulties (electrolarynx)   Cognition Arousal/Alertness: Awake/alert Behavior During Therapy: WFL for tasks assessed/performed Overall Cognitive Status: Within Functional Limits for tasks assessed                     General Comments       Exercises       Shoulder Instructions      Home Living Family/patient expects to be discharged to:: Private residence Living Arrangements: Spouse/significant other;Children (adult son) Available Help at Discharge: Family;Available 24 hours/day Type of Home: House Home Access: Stairs to enter [employer] of Steps:  1 Entrance Stairs-Rails: None Home Layout: One level (one step to get down into kitchen)     Bathroom Shower/Tub: Teacher, early years/pre: Standard Bathroom Accessibility: No (w/c won't fit)   Home Equipment: Walker - 2 wheels;Wheelchair - [employer];Shower seat;Hand held shower head;Adaptive equipment Adaptive Equipment:  Reacher        Prior Functioning/Environment Level of Independence: Needs assistance  Gait / Transfers Assistance Needed: was primarily w/c dependent due to NWB status on L LE and weakness in R LE  ADL's / Homemaking Assistance Needed: set up for sponge bathing and dressing leaning side to side, assist for IADL        OT Diagnosis: Generalized weakness;Acute pain   OT Problem List: Decreased strength;Decreased activity tolerance;Impaired balance (sitting and/or standing);Decreased knowledge of use of DME or AE;Obesity;Pain;Impaired UE functional use;Decreased range of motion   OT Treatment/Interventions: Self-care/ADL training;Therapeutic exercise;DME and/or AE instruction;Therapeutic activities;Patient/family education;Balance training    OT Goals(Current goals can be found in the care plan section) Acute Rehab OT Goals Patient Stated Goal: use a prosthesis when L LE heals OT Goal Formulation: With patient Time For Goal Achievement: 09/28/15 Potential to Achieve Goals: Good ADL Goals Pt Will Perform Grooming: with modified independence;sitting (at sink) Pt Will Perform Lower Body Bathing: with modified independence;sitting/lateral leans Pt Will Perform Lower Body Dressing: with modified independence;sitting/lateral leans Pt Will Transfer to Toilet: with modified independence;bedside commode (lateral with drop arm commode) Pt Will Perform Toileting - Clothing Manipulation and hygiene: with modified independence;sitting/lateral leans Pt/caregiver will Perform Home Exercise Program: Increased strength;Both right and left upper extremity;With theraband;Independently  OT Frequency: Min 2X/week   Barriers to D/C:            Co-evaluation PT/OT/SLP Co-Evaluation/Treatment: Yes Reason for Co-Treatment: For patient/therapist safety          End of Session Equipment Utilized During Treatment: Rolling walker Nurse Communication: Mobility status;Patient requests pain  meds  Activity Tolerance: Patient tolerated treatment well Patient left: in chair;with call bell/phone within reach;with family/visitor present   Time: 0913-1002 OT Time Calculation (min): 49 min Charges:  OT General Charges $OT Visit: 1 Procedure OT Evaluation $OT Eval Moderate Complexity: 1 Procedure G-Codes:    Malka So 09/14/2015, 10:30 AM  [phone]

## 2015-09-14 NOTE — Progress Notes (Signed)
Subjective: Doing well.  Pain controlled.    Objective: Vital signs in last 24 hours: Temp:  [97.5 F (36.4 C)-99.8 F (37.7 C)] 98.2 F (36.8 C) (07/20 0540) Pulse Rate:  [72-92] 74 (07/20 0540) Resp:  [12-16] 16 (07/20 0008) BP: (106-131)/(61-87) 109/64 mmHg (07/20 0540) SpO2:  [92 %-100 %] 96 % (07/20 0540)  Intake/Output from previous day: 07/19 0701 - 07/20 0700 In: 650 [I.V.:650] Out: 50 [Blood:50] Intake/Output this shift:    No results for input(s): HGB in the last 72 hours. No results for input(s): WBC, RBC, HCT, PLT in the last 72 hours.  Recent Labs  09/13/15 0406 09/14/15 0333  NA 141 140  K 4.4 3.8  CL 104 106  CO2 27 30  BUN 17 15  CREATININE 1.32* 1.22*  GLUCOSE 89 122*  CALCIUM 8.9 8.4*   No results for input(s): LABPT, INR in the last 72 hours.  Exam:  Alert and oriented. Dressing c/d/i. Knee immobilizer on.   Assessment/Plan: Dr Lorin Mercy recommends iv abx 2 days postop then ok to d/c home from ortho standpoint.  F/u in our office one week postop.    Madyson Lukach M 09/14/2015, 7:08 AM

## 2015-09-14 NOTE — Consult Note (Signed)
Physical Medicine and Rehabilitation Consult  Reason for Consult: L-BKA Referring Physician: Dr. Wendy Poet   HPI: [name] is a 60 y.o. 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  She elected to go  Home and was admitted on 07/15 pm with fever and SIR reaction due to wound infection. She was started on broad spectrum antibiotics and MRI foot showed edema but no evidence of osteo.  Wound continue to decline and she underwent L-BKA by Dr. Sharol Given on 09/13/15. OT/PT evaluations done today and CIR recommended for follow up therapy.    h  Review of Systems  HENT: Negative for hearing loss.   Eyes: Negative for blurred vision and double vision.  Respiratory: Negative for cough and shortness of breath.   Cardiovascular: Negative for chest pain and palpitations.  Gastrointestinal: Negative for heartburn and nausea.  Musculoskeletal: Positive for myalgias and falls.  Neurological: Positive for sensory change, focal weakness and weakness. Negative for dizziness and headaches.  Psychiatric/Behavioral: The patient is nervous/anxious.       Past Medical History  Diagnosis Date  . Hypertension   . COPD (chronic obstructive pulmonary disease) ([place])   . Shortness of breath   . Arthritis   . Diabetic neuropathy ([place])   . Rash, skin     face  . Kidney stone   . Sleep apnea     has not used cpap past 10 yrs  . Nocturia   . Urge incontinence   . Morbid obesity with BMI of 40.0-44.9, adult ([place])   . Renal insufficiency   . Diabetes mellitus without complication ([place])     INSULIN DEPENDENT  . Neuropathy ([place])   . Head and neck cancer ([place]) 04/19/2015  . Pneumonia   . Depression   . Anxiety   . Fibromyalgia   . Difficult intubation     total laryngectomy     Past Surgical History  Procedure Laterality Date  . Cholecystectomy    . Tubal ligation    . Carpel tunnel    . Cystoscopy w/ ureteral stent placement Left 03/22/2014    Procedure: CYSTOSCOPY WITH RETROGRADE PYELOGRAM/URETERAL STENT PLACEMENT;  Surgeon: Alexis Frock, MD;  Location: [facility];  Service: Urology;  Laterality: Left;  . Salivary stone removal    . Cystoscopy with retrograde pyelogram, ureteroscopy and stent placement Left 05/11/2014    Procedure: [place], URETEROSCOPY AND STENT PLACEMENT;  Surgeon: Alexis Frock, MD;  Location: [facility];  Service: Urology;  Laterality: Left;  . Holmium laser application Left 03/29/5425    Procedure: HOLMIUM LASER APPLICATION;  Surgeon: Alexis Frock, MD;  Location: [facility];  Service: Urology;  Laterality: Left;  . Brain surgery      vertebral aneurysm  . Tonsillectomy    . Laryngectomy      total ( stoma)  . Amputation Left 08/25/2015    Procedure: 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;  Surgeon: Newt Minion, MD;  Location: [facility];  Service: Orthopedics;  Laterality: Left;  . Breast lumpectomy  1982    benign  . Leg amputation below knee Left 09/13/2015  . Amputation Left 09/13/2015    Procedure: AMPUTATION BELOW KNEE;  Surgeon: Marybelle Killings, MD;  Location: [facility];  Service: Orthopedics;  Laterality: Left;  Family History  Problem Relation Age of Onset  . 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  She lives with son and was able to ambulate with cane PTA.  She reports that she quit smoking 05/22/15.Marland Kitchen Her smoking use included Cigarettes. She has a 40 pack-year smoking history. She has never used smokeless tobacco. She reports that she does not drink alcohol or use illicit drugs.     Allergies  Allergen Reactions  . Ace Inhibitors Anaphylaxis and Swelling    Angioedema  . Zestril [Lisinopril] Anaphylaxis and Swelling    Angioedema   Medications Prior to Admission    Medication Sig Dispense Refill  . albuterol (PROVENTIL HFA;VENTOLIN HFA) 108 (90 BASE) MCG/ACT inhaler Inhale 2 puffs into the lungs every 4 (four) hours as needed for wheezing or shortness of breath. 18 g 1  . amLODipine (NORVASC) 10 MG tablet Take 1 tablet (10 mg total) by mouth daily. 30 tablet 0  . blood glucose meter kit and supplies KIT Dispense based on patient and insurance preference. Use up to four times daily as directed. (FOR ICD-9 250.00, 250.01). 1 each 0  . Buprenorphine HCl-Naloxone HCl (SUBOXONE) 8-2 MG FILM Place 1 Film under the tongue 3 (three) times daily.     . cyclobenzaprine (FLEXERIL) 10 MG tablet Take 10 mg by mouth at bedtime as needed for muscle spasms.    Marland Kitchen docusate sodium (COLACE) 100 MG capsule Take 100 mg by mouth daily as needed for mild constipation.    Marland Kitchen ibuprofen (ADVIL,MOTRIN) 200 MG tablet Take 400-600 mg by mouth every 6 (six) hours as needed for fever, headache or moderate pain.     Marland Kitchen insulin aspart (NOVOLOG FLEXPEN) 100 UNIT/ML FlexPen Inject 3 Units into the skin 3 (three) times daily with meals.    . Insulin Glargine (LANTUS SOLOSTAR) 100 UNIT/ML Solostar Pen Inject 34 Units into the skin daily. At 10AM (Patient taking differently: Inject 30 Units into the skin every morning. At 10AM) 15 mL 3  . Insulin Pen Needle (PEN NEEDLES 3/16") 31G X 5 MM MISC Use as directed. 100 each 3  . Multiple Vitamin (MULTIVITAMIN) tablet Take 1 tablet by mouth daily with supper.     . pregabalin (LYRICA) 75 MG capsule Take 75 mg by mouth 4 (four) times daily.      Home: Home Living Family/patient expects to be discharged to:: Private residence Living Arrangements: Spouse/significant other, Children (adult son) Available Help at Discharge: Family, Available 24 hours/day Type of Home: House Home Access: Stairs to enter Technical brewer of Steps: 1 Entrance Stairs-Rails: None Home Layout: One level (one step to get down into kitchen) Bathroom Shower/Tub:  Chiropodist: Standard Bathroom Accessibility: No (w/c won't fit) Home Equipment: Environmental consultant - 2 wheels, Wheelchair - manual, Bedside commode, Shower seat, Hand held shower head, Radiation protection practitioner Equipment: Reacher  Functional History: Prior Function Level of Independence: Needs assistance Gait / Transfers Assistance Needed: was primarily w/c dependent due to NWB status on L LE and weakness in R LE  ADL's / Homemaking Assistance Needed: set up for sponge bathing and dressing leaning side to side, assist for IADL Functional Status:  Mobility: Bed Mobility Overal bed mobility: Needs Assistance Bed Mobility: Supine to Sit Supine to sit: Supervision, HOB elevated General bed mobility comments: with rail Transfers Overall transfer level: Needs assistance Equipment used: Rolling walker (2 wheeled) Transfers: Sit to/from Stand Sit to Stand: +2 physical  assistance, Mod assist General transfer comment: did not achieve full standing position      ADL: ADL Overall ADL's : Needs assistance/impaired Eating/Feeding: Independent, Bed level Grooming: Sitting, Supervision/safety Upper Body Bathing: Sitting, Minimal assitance Lower Body Bathing: Sitting/lateral leans, Moderate assistance Upper Body Dressing : Supervision/safety, Sitting Lower Body Dressing: Moderate assistance, Sit to/from stand Toilet Transfer: Min guard, Requires drop arm, Cueing for sequencing, Cueing for safety (lateral transfer) Toileting- Clothing Manipulation and Hygiene: Supervision/safety, Sitting/lateral lean  Cognition: Cognition Overall Cognitive Status: Within Functional Limits for tasks assessed Orientation Level: Oriented X4 Cognition Arousal/Alertness: Awake/alert Behavior During Therapy: WFL for tasks assessed/performed Overall Cognitive Status: Within Functional Limits for tasks assessed   Blood pressure 109/64, pulse 76, temperature 98.2 F (36.8 C), temperature source Oral,  resp. rate 18, SpO2 97 %. Physical Exam  Nursing note and vitals reviewed. Constitutional: She is oriented to person, place, and time. She appears well-developed and well-nourished.  Alert sitting in bed. Morbidly obese.   HENT:  Head: Normocephalic and atraumatic.  Mouth/Throat: Oropharynx is clear and moist.  Eyes: Conjunctivae are normal. Pupils are equal, round, and reactive to light.  Neck: Normal range of motion. Neck supple.  Trach site intact  Cardiovascular: Normal rate and regular rhythm.  Exam reveals no friction rub.   No murmur heard. Respiratory: Effort normal and breath sounds normal. No stridor. No respiratory distress. She has no wheezes.  Trach plugged.   GI: Soft. Bowel sounds are normal. She exhibits no distension. There is no tenderness.  Musculoskeletal: She exhibits edema (1+ edema RLE and moderate edema L-BKA.).  Contractures bilateral knees with arthritic changes noted RLE. L-BKA with compressive dressing and flexed/elevated on pillow. Varus deformities both knees. Able to extend fully with PROM.   Neurological: She is alert and oriented to person, place, and time. No cranial nerve deficit. She exhibits normal muscle tone. Coordination normal.  Able to communicate with mechanical laryngoscopy device. UE 5/5. RLE: 4/5 prox to distal. Unable to lift left leg off bed yet. Decreased LT below right knee.   Skin: Skin is warm and dry.  Psychiatric: Her mood appears anxious. Her speech is rapid and/or pressured. She expresses inappropriate judgment. She is inattentive.    Results for orders placed or performed during the hospital encounter of 09/09/15 (from the past 24 hour(s))  Glucose, capillary     Status: None   Collection Time: 09/13/15 11:53 AM  Result Value Ref Range   Glucose-Capillary 75 65 - 99 mg/dL   Comment 1 Notify RN    Comment 2 Document in Chart   Glucose, capillary     Status: None   Collection Time: 09/13/15  3:10 PM  Result Value Ref Range    Glucose-Capillary 93 65 - 99 mg/dL  Glucose, capillary     Status: Abnormal   Collection Time: 09/13/15  7:06 PM  Result Value Ref Range   Glucose-Capillary 197 (H) 65 - 99 mg/dL  Glucose, capillary     Status: Abnormal   Collection Time: 09/13/15  8:17 PM  Result Value Ref Range   Glucose-Capillary 184 (H) 65 - 99 mg/dL  Basic metabolic panel     Status: Abnormal   Collection Time: 09/14/15  3:33 AM  Result Value Ref Range   Sodium 140 135 - 145 mmol/L   Potassium 3.8 3.5 - 5.1 mmol/L   Chloride 106 101 - 111 mmol/L   CO2 30 22 - 32 mmol/L   Glucose, Bld 122 (H) 65 - 99 mg/dL  BUN 15 6 - 20 mg/dL   Creatinine, Ser 1.22 (H) 0.44 - 1.00 mg/dL   Calcium 8.4 (L) 8.9 - 10.3 mg/dL   GFR calc non Af Amer 48 (L) >60 mL/min   GFR calc Af Amer 56 (L) >60 mL/min   Anion gap 4 (L) 5 - 15  Glucose, capillary     Status: Abnormal   Collection Time: 09/14/15  5:46 AM  Result Value Ref Range   Glucose-Capillary 106 (H) 65 - 99 mg/dL   No results found.  Assessment/Plan: Diagnosis: s/p left BKA, OA right knee, morbidly obese 1. Does the need for close, 24 hr/day medical supervision in concert with the patient's rehab needs make it unreasonable for this patient to be served in a less intensive setting? Yes 2. Co-Morbidities requiring supervision/potential complications: PVD, dm, htn, ckd, copd 3. Due to bladder management, bowel management, safety, skin/wound care, disease management, medication administration, pain management and patient education, does the patient require 24 hr/day rehab nursing? Yes 4. Does the patient require coordinated care of a physician, rehab nurse, PT (1-2 hrs/day, 5 days/week) and OT (1-2 hrs/day, 5 days/week) to address physical and functional deficits in the context of the above medical diagnosis(es)? Yes Addressing deficits in the following areas: balance, endurance, locomotion, strength, transferring, bowel/bladder control, bathing, dressing, feeding, grooming,  toileting and psychosocial support 5. Can the patient actively participate in an intensive therapy program of at least 3 hrs of therapy per day at least 5 days per week? Yes 6. The potential for patient to make measurable gains while on inpatient rehab is excellent 7. Anticipated functional outcomes upon discharge from inpatient rehab are modified independent  with PT, modified independent with OT, n/a with SLP. 8. Estimated rehab length of stay to reach the above functional goals is: 7-10 days 9. Does the patient have adequate social supports and living environment to accommodate these discharge functional goals? Yes 10. Anticipated D/C setting: Home 11. Anticipated post D/C treatments: HH therapy and Outpatient therapy 12. Overall Rehab/Functional Prognosis: excellent  RECOMMENDATIONS: This patient's condition is appropriate for continued rehabilitative care in the following setting: CIR Patient has agreed to participate in recommended program. Yes Note that insurance prior authorization may be required for reimbursement for recommended care.  Comment: Rehab Admissions Coordinator to follow up.  Thanks,  Meredith Staggers, MD, Mellody Drown     09/14/2015

## 2015-09-14 NOTE — Op Note (Signed)
[name], [name]                  ACCOUNT NO.:  [id number]  MEDICAL RECORD NO.:  [id number]  LOCATION:  [facility]                         FACILITY:  [facility]  PHYSICIAN:  Lydiah Pong C. Lorin Mercy, M.D.    DATE OF BIRTH:  05-30-1955  DATE OF PROCEDURE:  09/13/2015 DATE OF DISCHARGE:                              OPERATIVE REPORT   PREOPERATIVE DIAGNOSIS:  Left fifth ray dehiscence with previous diabetic ulcers and osteomyelitis.  POSTOPERATIVE DIAGNOSIS:  Left fifth ray dehiscence with previous diabetic ulcers and osteomyelitis.  PROCEDURE:  Left below-knee amputation.  SURGEON:  Chanel Mckesson C. Lorin Mercy, M.D.  ANESTHESIA:  Attempted block only followed by general mask.  TOURNIQUET TIME:  Less than 20 minutes.  DESCRIPTION OF PROCEDURE:  After informed consent, the patient was taken to the operating room, she had undergone femoral-popliteal block for attempted procedure, she still was able to move her leg, was straightening and bending it, felt a prep and was given IV sedation, some propofol, mask anesthesia, etc.  Sterile incision was made.  Time- out procedure was performed.  The patient has been on IV antibiotics. Transverse incision was made with posterior flap.  The leg had been prepped with DuraPrep and the foot with the draining ulceration had been covered with the Betadine, Ioban, Steri-Drape, sealing it at the ankle. Sharp dissection down onto the anterior tibia, bone was cut 1 cm above the skin, tibia was cut 2 cm proximal to that.  Anterior tibial artery was doubly ligated with #2 silk.  Sural nerve was cut back on stretch, lesser saphenous bovied, saphenous vein was suture ligatured proximally, coagulated distally.  Tibia was divided as well as the fibula and posterior tibial arteries were suture ligated.  Some smaller veins were coagulated with the Bovie.  Amputation knife was used to thin the posterior flap saving the gastrocs, thinning the soleus.  Leg was sent for pathologic exam.  Copious  irrigation.  Meticulous hemostasis. Tourniquet deflated, one additional artery was ligated.  Posterior gastroc tendon was pulled anterior suture to the periosteum over the anterior portion of the tibia.  Fascia on the medial and lateral. Subcutaneous tissue reapproximated with 2-0 and mostly staples with center area closed with nylon sutures.  Postop dressing was applied with Xeroform, 4x4s, ABDs, Kerlix and Coban.  The patient tolerated the procedure well.     Jakaiden Fill C. Lorin Mercy, M.D.     MCY/MEDQ  D:  09/13/2015  T:  09/14/2015  Job:  [id number]

## 2015-09-15 ENCOUNTER — Inpatient Hospital Stay (HOSPITAL_COMMUNITY)
Admission: RE | Admit: 2015-09-15 | Discharge: 2015-09-26 | DRG: 560 | Disposition: A | Discharge status: Home / Self Care | Payer: Medicaid Other | Source: Intra-hospital | Attending: Physical Medicine & Rehabilitation | Admitting: Physical Medicine & Rehabilitation

## 2015-09-15 DIAGNOSIS — G546 Phantom limb syndrome with pain: Secondary | ICD-10-CM | POA: Diagnosis present

## 2015-09-15 DIAGNOSIS — Z794 Long term (current) use of insulin: Secondary | ICD-10-CM | POA: Diagnosis not present

## 2015-09-15 DIAGNOSIS — M1711 Unilateral primary osteoarthritis, right knee: Secondary | ICD-10-CM | POA: Diagnosis not present

## 2015-09-15 DIAGNOSIS — B373 Candidiasis of vulva and vagina: Secondary | ICD-10-CM | POA: Diagnosis present

## 2015-09-15 DIAGNOSIS — G8929 Other chronic pain: Secondary | ICD-10-CM | POA: Diagnosis present

## 2015-09-15 DIAGNOSIS — F419 Anxiety disorder, unspecified: Secondary | ICD-10-CM | POA: Diagnosis present

## 2015-09-15 DIAGNOSIS — M792 Neuralgia and neuritis, unspecified: Secondary | ICD-10-CM | POA: Diagnosis not present

## 2015-09-15 DIAGNOSIS — G8918 Other acute postprocedural pain: Secondary | ICD-10-CM | POA: Diagnosis not present

## 2015-09-15 DIAGNOSIS — S88119A Complete traumatic amputation at level between knee and ankle, unspecified lower leg, initial encounter: Secondary | ICD-10-CM | POA: Diagnosis present

## 2015-09-15 DIAGNOSIS — Z87891 Personal history of nicotine dependence: Secondary | ICD-10-CM

## 2015-09-15 DIAGNOSIS — Z6841 Body Mass Index (BMI) 40.0 and over, adult: Secondary | ICD-10-CM

## 2015-09-15 DIAGNOSIS — K5903 Drug induced constipation: Secondary | ICD-10-CM | POA: Diagnosis present

## 2015-09-15 DIAGNOSIS — Z888 Allergy status to other drugs, medicaments and biological substances status: Secondary | ICD-10-CM

## 2015-09-15 DIAGNOSIS — I10 Essential (primary) hypertension: Secondary | ICD-10-CM

## 2015-09-15 DIAGNOSIS — G4733 Obstructive sleep apnea (adult) (pediatric): Secondary | ICD-10-CM | POA: Diagnosis present

## 2015-09-15 DIAGNOSIS — N189 Chronic kidney disease, unspecified: Secondary | ICD-10-CM | POA: Diagnosis present

## 2015-09-15 DIAGNOSIS — R030 Elevated blood-pressure reading, without diagnosis of hypertension: Secondary | ICD-10-CM | POA: Diagnosis not present

## 2015-09-15 DIAGNOSIS — E1142 Type 2 diabetes mellitus with diabetic polyneuropathy: Secondary | ICD-10-CM | POA: Diagnosis present

## 2015-09-15 DIAGNOSIS — Z79899 Other long term (current) drug therapy: Secondary | ICD-10-CM

## 2015-09-15 DIAGNOSIS — Z9002 Acquired absence of larynx: Secondary | ICD-10-CM

## 2015-09-15 DIAGNOSIS — M170 Bilateral primary osteoarthritis of knee: Secondary | ICD-10-CM | POA: Diagnosis present

## 2015-09-15 DIAGNOSIS — Z4781 Encounter for orthopedic aftercare following surgical amputation: Secondary | ICD-10-CM | POA: Diagnosis present

## 2015-09-15 DIAGNOSIS — Z8521 Personal history of malignant neoplasm of larynx: Secondary | ICD-10-CM

## 2015-09-15 DIAGNOSIS — J410 Simple chronic bronchitis: Secondary | ICD-10-CM

## 2015-09-15 DIAGNOSIS — E1122 Type 2 diabetes mellitus with diabetic chronic kidney disease: Secondary | ICD-10-CM | POA: Diagnosis present

## 2015-09-15 DIAGNOSIS — M62838 Other muscle spasm: Secondary | ICD-10-CM | POA: Diagnosis not present

## 2015-09-15 DIAGNOSIS — Z89529 Acquired absence of unspecified knee: Secondary | ICD-10-CM

## 2015-09-15 DIAGNOSIS — Z89519 Acquired absence of unspecified leg below knee: Secondary | ICD-10-CM | POA: Diagnosis not present

## 2015-09-15 DIAGNOSIS — J209 Acute bronchitis, unspecified: Secondary | ICD-10-CM | POA: Insufficient documentation

## 2015-09-15 DIAGNOSIS — M797 Fibromyalgia: Secondary | ICD-10-CM | POA: Diagnosis present

## 2015-09-15 DIAGNOSIS — R269 Unspecified abnormalities of gait and mobility: Secondary | ICD-10-CM | POA: Diagnosis not present

## 2015-09-15 DIAGNOSIS — Z89512 Acquired absence of left leg below knee: Secondary | ICD-10-CM | POA: Diagnosis not present

## 2015-09-15 DIAGNOSIS — J449 Chronic obstructive pulmonary disease, unspecified: Secondary | ICD-10-CM | POA: Diagnosis present

## 2015-09-15 DIAGNOSIS — I129 Hypertensive chronic kidney disease with stage 1 through stage 4 chronic kidney disease, or unspecified chronic kidney disease: Secondary | ICD-10-CM | POA: Diagnosis present

## 2015-09-15 DIAGNOSIS — M25551 Pain in right hip: Secondary | ICD-10-CM

## 2015-09-15 DIAGNOSIS — S88112S Complete traumatic amputation at level between knee and ankle, left lower leg, sequela: Secondary | ICD-10-CM | POA: Diagnosis not present

## 2015-09-15 LAB — CBC
HCT: 37.1 % (ref 36.0–46.0)
HEMOGLOBIN: 11.5 g/dL — AB (ref 12.0–15.0)
MCH: 26.7 pg (ref 26.0–34.0)
MCHC: 31 g/dL (ref 30.0–36.0)
MCV: 86.1 fL (ref 78.0–100.0)
PLATELETS: 268 10*3/uL (ref 150–400)
RBC: 4.31 MIL/uL (ref 3.87–5.11)
RDW: 13.8 % (ref 11.5–15.5)
WBC: 7.3 10*3/uL (ref 4.0–10.5)

## 2015-09-15 LAB — GLUCOSE, CAPILLARY
Glucose-Capillary: 94 mg/dL (ref 65–99)
Glucose-Capillary: 122 mg/dL — ABNORMAL HIGH (ref 65–99)
Glucose-Capillary: 138 mg/dL — ABNORMAL HIGH (ref 65–99)
Glucose-Capillary: 94 mg/dL (ref 65–99)

## 2015-09-15 LAB — BASIC METABOLIC PANEL
ANION GAP: 7 (ref 5–15)
BUN: 12 mg/dL (ref 6–20)
CHLORIDE: 105 mmol/L (ref 101–111)
CO2: 30 mmol/L (ref 22–32)
Calcium: 8.7 mg/dL — ABNORMAL LOW (ref 8.9–10.3)
Creatinine, Ser: 1.26 mg/dL — ABNORMAL HIGH (ref 0.44–1.00)
GFR calc Af Amer: 54 mL/min — ABNORMAL LOW (ref >60)
GFR, EST NON AFRICAN AMERICAN: 46 mL/min — AB (ref >60)
GLUCOSE: 98 mg/dL (ref 65–99)
POTASSIUM: 3.9 mmol/L (ref 3.5–5.1)
SODIUM: 142 mmol/L (ref 135–145)

## 2015-09-15 MED ORDER — ONDANSETRON HCL 4 MG PO TABS
4.0000 mg | ORAL_TABLET | Freq: Four times a day (QID) | ORAL | Status: DC | PRN
  Administered 2015-09-16 – 2015-09-18 (×3): 4 mg via ORAL
  Filled 2015-09-15 (×3): qty 1

## 2015-09-15 MED ORDER — INSULIN GLARGINE 100 UNIT/ML ~~LOC~~ SOLN
17.0000 [IU] | Freq: Every day | SUBCUTANEOUS | Status: DC
  Administered 2015-09-15 – 2015-09-25 (×10): 17 [IU] via SUBCUTANEOUS
  Filled 2015-09-15 (×12): qty 0.17

## 2015-09-15 MED ORDER — GUAIFENESIN-DM 100-10 MG/5ML PO SYRP: 5.0000 mL | ORAL_SOLUTION | Freq: Four times a day (QID) | ORAL | Status: DC | PRN

## 2015-09-15 MED ORDER — NYSTATIN 100000 UNIT/ML MT SUSP
5.0000 mL | Freq: Four times a day (QID) | OROMUCOSAL | Status: DC
  Administered 2015-09-15 – 2015-09-26 (×43): 500000 [IU] via ORAL
  Filled 2015-09-15 (×42): qty 5

## 2015-09-15 MED ORDER — FLUCONAZOLE 150 MG PO TABS
150.0000 mg | ORAL_TABLET | Freq: Every day | ORAL | Status: DC
  Administered 2015-09-15: 150 mg via ORAL
  Filled 2015-09-15: qty 1

## 2015-09-15 MED ORDER — PROCHLORPERAZINE MALEATE 5 MG PO TABS: 5.0000 mg | ORAL_TABLET | Freq: Four times a day (QID) | ORAL | Status: DC | PRN

## 2015-09-15 MED ORDER — BACLOFEN 5 MG HALF TABLET
5.0000 mg | ORAL_TABLET | Freq: Three times a day (TID) | ORAL | Status: DC
  Administered 2015-09-15 – 2015-09-26 (×33): 5 mg via ORAL
  Filled 2015-09-15 (×33): qty 1

## 2015-09-15 MED ORDER — ONDANSETRON HCL 4 MG/2ML IJ SOLN: 4.0000 mg | Freq: Four times a day (QID) | INTRAMUSCULAR | Status: DC | PRN

## 2015-09-15 MED ORDER — TRAZODONE HCL 50 MG PO TABS
25.0000 mg | ORAL_TABLET | Freq: Every evening | ORAL | Status: DC | PRN
  Filled 2015-09-15: qty 1

## 2015-09-15 MED ORDER — BISACODYL 10 MG RE SUPP
10.0000 mg | Freq: Every day | RECTAL | Status: DC | PRN
  Filled 2015-09-15: qty 1

## 2015-09-15 MED ORDER — ALUM & MAG HYDROXIDE-SIMETH 200-200-20 MG/5ML PO SUSP: 30.0000 mL | ORAL | Status: DC | PRN

## 2015-09-15 MED ORDER — HYDROMORPHONE HCL 2 MG PO TABS
2.0000 mg | ORAL_TABLET | ORAL | Status: DC | PRN
  Administered 2015-09-15 – 2015-09-18 (×12): 4 mg via ORAL
  Filled 2015-09-15 (×12): qty 2

## 2015-09-15 MED ORDER — INSULIN ASPART 100 UNIT/ML ~~LOC~~ SOLN
0.0000 [IU] | Freq: Three times a day (TID) | SUBCUTANEOUS | Status: DC
  Administered 2015-09-17: 3 [IU] via SUBCUTANEOUS
  Administered 2015-09-19 – 2015-09-24 (×3): 2 [IU] via SUBCUTANEOUS
  Administered 2015-09-26: 3 [IU] via SUBCUTANEOUS

## 2015-09-15 MED ORDER — DIPHENHYDRAMINE HCL 12.5 MG/5ML PO ELIX: 12.5000 mg | ORAL_SOLUTION | Freq: Four times a day (QID) | ORAL | Status: DC | PRN

## 2015-09-15 MED ORDER — PIPERACILLIN-TAZOBACTAM 3.375 G IVPB
3.3750 g | Freq: Three times a day (TID) | INTRAVENOUS | Status: AC
  Administered 2015-09-15: 3.375 g via INTRAVENOUS
  Filled 2015-09-15: qty 50

## 2015-09-15 MED ORDER — AMLODIPINE BESYLATE 10 MG PO TABS
10.0000 mg | ORAL_TABLET | Freq: Every day | ORAL | Status: DC
  Administered 2015-09-16 – 2015-09-26 (×11): 10 mg via ORAL
  Filled 2015-09-15 (×11): qty 1

## 2015-09-15 MED ORDER — BUPRENORPHINE HCL 8 MG SL SUBL
8.0000 mg | SUBLINGUAL_TABLET | Freq: Three times a day (TID) | SUBLINGUAL | Status: DC
  Administered 2015-09-15 – 2015-09-26 (×33): 8 mg via SUBLINGUAL
  Filled 2015-09-15 (×33): qty 1

## 2015-09-15 MED ORDER — PROCHLORPERAZINE 25 MG RE SUPP: 12.5000 mg | Freq: Four times a day (QID) | RECTAL | Status: DC | PRN

## 2015-09-15 MED ORDER — DOCUSATE SODIUM 100 MG PO CAPS
100.0000 mg | ORAL_CAPSULE | Freq: Two times a day (BID) | ORAL | Status: DC
  Administered 2015-09-15 – 2015-09-18 (×6): 100 mg via ORAL
  Filled 2015-09-15 (×6): qty 1

## 2015-09-15 MED ORDER — ASPIRIN EC 81 MG PO TBEC
81.0000 mg | DELAYED_RELEASE_TABLET | Freq: Every day | ORAL | Status: DC
  Administered 2015-09-16 – 2015-09-26 (×11): 81 mg via ORAL
  Filled 2015-09-15 (×11): qty 1

## 2015-09-15 MED ORDER — INSULIN ASPART 100 UNIT/ML ~~LOC~~ SOLN: 0.0000 [IU] | Freq: Every day | SUBCUTANEOUS | Status: DC

## 2015-09-15 MED ORDER — IBUPROFEN 600 MG PO TABS
600.0000 mg | ORAL_TABLET | Freq: Four times a day (QID) | ORAL | Status: DC | PRN
  Filled 2015-09-15: qty 1

## 2015-09-15 MED ORDER — PREGABALIN 75 MG PO CAPS
75.0000 mg | ORAL_CAPSULE | Freq: Four times a day (QID) | ORAL | Status: DC
  Administered 2015-09-17 (×2): 75 mg via ORAL
  Filled 2015-09-15 (×6): qty 1

## 2015-09-15 MED ORDER — FLUCONAZOLE 100 MG PO TABS
150.0000 mg | ORAL_TABLET | Freq: Every day | ORAL | Status: AC
  Administered 2015-09-16: 150 mg via ORAL
  Filled 2015-09-15: qty 2

## 2015-09-15 MED ORDER — ALBUTEROL SULFATE (2.5 MG/3ML) 0.083% IN NEBU: 2.5000 mg | INHALATION_SOLUTION | RESPIRATORY_TRACT | Status: DC | PRN

## 2015-09-15 MED ORDER — VANCOMYCIN HCL IN DEXTROSE 750-5 MG/150ML-% IV SOLN
750.0000 mg | Freq: Two times a day (BID) | INTRAVENOUS | Status: AC
  Administered 2015-09-15: 750 mg via INTRAVENOUS
  Filled 2015-09-15: qty 150

## 2015-09-15 MED ORDER — PROCHLORPERAZINE EDISYLATE 5 MG/ML IJ SOLN: 5.0000 mg | Freq: Four times a day (QID) | INTRAMUSCULAR | Status: DC | PRN

## 2015-09-15 MED ORDER — FLEET ENEMA 7-19 GM/118ML RE ENEM: 1.0000 | ENEMA | Freq: Once | RECTAL | Status: DC | PRN

## 2015-09-15 MED ORDER — NALOXONE HCL 0.4 MG/ML IJ SOLN
0.4000 mg | INTRAMUSCULAR | Status: DC | PRN
Start: 2015-09-15 — End: 2015-09-26

## 2015-09-15 NOTE — Progress Notes (Signed)
Pt discharge education and instructions completed. Pt discharge to CIR and report called off to [facility] on 4W. Pt incision dsg remains clean, dry and intact. Pt transported off unit via wheelchair with belongings and family at side and IV intact and transfusing with pt abx. Francis Gaines Tamela Elsayed RN.

## 2015-09-15 NOTE — H&P (Signed)
Physical Medicine and Rehabilitation Admission H&P    Chief Complaint  Patient presents with  . L-BKA    HPI: [name] is a 60 y.o. 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 She elected to go Home and was admitted on 07/15 pm with fever and SIR reaction due to wound infection. She was started on broad spectrum antibiotics and MRI foot showed edema but no evidence of osteo. Wound continue to decline and she underwent L-BKA by Dr. Sharol Given on 09/13/15. Post op with acute on chronic pain with high levels of anxiety. Dilaudid discontinued and percocet scheduled to help with pain management. She completes antibiotic regimen today and     ROS    Past Medical History  Diagnosis Date  . Hypertension   . COPD (chronic obstructive pulmonary disease) ([place])   . Shortness of breath   . Arthritis   . Diabetic neuropathy ([place])   . Rash, skin     face  . Kidney stone   . Sleep apnea     has not used cpap past 10 yrs  . Nocturia   . Urge incontinence   . Morbid obesity with BMI of 40.0-44.9, adult ([place])   . Renal insufficiency   . Diabetes mellitus without complication ([place])     INSULIN DEPENDENT  . Neuropathy ([place])   . Head and neck cancer ([place]) 04/19/2015  . Pneumonia   . Depression   . Anxiety   . Fibromyalgia   . Difficult intubation     total laryngectomy    Past Surgical History  Procedure Laterality Date  . Cholecystectomy    . Tubal ligation    . Carpel tunnel    . Cystoscopy w/ ureteral stent placement Left 03/22/2014    Procedure: CYSTOSCOPY WITH RETROGRADE PYELOGRAM/URETERAL STENT PLACEMENT;  Surgeon: Alexis Frock, MD;  Location: [facility];  Service: Urology;  Laterality: Left;  . Salivary stone removal    . Cystoscopy  with retrograde pyelogram, ureteroscopy and stent placement Left 05/11/2014    Procedure: [place], URETEROSCOPY AND STENT PLACEMENT;  Surgeon: Alexis Frock, MD;  Location: [facility];  Service: Urology;  Laterality: Left;  . Holmium laser application Left 5/85/9292    Procedure: HOLMIUM LASER APPLICATION;  Surgeon: Alexis Frock, MD;  Location: [facility];  Service: Urology;  Laterality: Left;  . Brain surgery      vertebral aneurysm  . Tonsillectomy    . Laryngectomy      total ( stoma)  . Amputation Left 08/25/2015    Procedure: 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;  Surgeon: Newt Minion, MD;  Location: [facility];  Service: Orthopedics;  Laterality: Left;  . Breast lumpectomy  1982    benign  . Leg amputation below knee Left 09/13/2015  . Amputation Left 09/13/2015    Procedure: AMPUTATION BELOW KNEE;  Surgeon: Marybelle Killings, MD;  Location: [facility];  Service: Orthopedics;  Laterality: Left;    Family History  Problem Relation Age of Onset  . 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 She lives with son and was able to ambulate with cane PTA. She reports that she quit smoking 05/22/15.Marland Kitchen Her smoking use included Cigarettes. She has a 40  pack-year smoking history. She has never used smokeless tobacco. She reports that she does not drink alcohol or use illicit drugs.    Allergies  Allergen Reactions  . Ace Inhibitors Anaphylaxis and Swelling    Angioedema  . Zestril [Lisinopril] Anaphylaxis and Swelling    Angioedema    Medications Prior to Admission  Medication Sig Dispense Refill  . albuterol (PROVENTIL HFA;VENTOLIN HFA) 108 (90 BASE) MCG/ACT inhaler Inhale 2 puffs into the lungs every 4 (four) hours as needed for wheezing or shortness of breath. 18 g 1  . amLODipine (NORVASC) 10 MG tablet Take 1 tablet (10 mg total) by mouth daily. 30 tablet 0  . blood glucose meter kit and supplies KIT Dispense based on patient and insurance  preference. Use up to four times daily as directed. (FOR ICD-9 250.00, 250.01). 1 each 0  . Buprenorphine HCl-Naloxone HCl (SUBOXONE) 8-2 MG FILM Place 1 Film under the tongue 3 (three) times daily.     . cyclobenzaprine (FLEXERIL) 10 MG tablet Take 10 mg by mouth at bedtime as needed for muscle spasms.    Marland Kitchen docusate sodium (COLACE) 100 MG capsule Take 100 mg by mouth daily as needed for mild constipation.    Marland Kitchen ibuprofen (ADVIL,MOTRIN) 200 MG tablet Take 400-600 mg by mouth every 6 (six) hours as needed for fever, headache or moderate pain.     Marland Kitchen insulin aspart (NOVOLOG FLEXPEN) 100 UNIT/ML FlexPen Inject 3 Units into the skin 3 (three) times daily with meals.    . Insulin Glargine (LANTUS SOLOSTAR) 100 UNIT/ML Solostar Pen Inject 34 Units into the skin daily. At 10AM (Patient taking differently: Inject 30 Units into the skin every morning. At 10AM) 15 mL 3  . Insulin Pen Needle (PEN NEEDLES 3/16") 31G X 5 MM MISC Use as directed. 100 each 3  . Multiple Vitamin (MULTIVITAMIN) tablet Take 1 tablet by mouth daily with supper.     . pregabalin (LYRICA) 75 MG capsule Take 75 mg by mouth 4 (four) times daily.      Home: Home Living Family/patient expects to be discharged to:: Private residence Living Arrangements: Spouse/significant other, Children (adult son) Available Help at Discharge: Family, Available 24 hours/day Type of Home: House Home Access: Stairs to enter [employer] of Steps: 1 Entrance Stairs-Rails: None Home Layout: One level (one step to get down into kitchen) Bathroom Shower/Tub: Chiropodist: Standard Bathroom Accessibility: No (w/c won't fit) Home Equipment: Environmental consultant - 2 wheels, Wheelchair - manual, Bedside commode, Shower seat, Hand held shower head, Radiation protection practitioner Equipment: Reacher   Functional History: Prior Function Level of Independence: Needs assistance Gait / Transfers Assistance Needed: was primarily w/c dependent due to  NWB status on L LE and weakness in R LE  ADL's / Homemaking Assistance Needed: set up for sponge bathing and dressing leaning side to side, assist for IADL  Functional Status:  Mobility: Bed Mobility Overal bed mobility: Needs Assistance Bed Mobility: Supine to Sit Supine to sit: Supervision, HOB elevated General bed mobility comments: with rail Transfers Overall transfer level: Needs assistance Equipment used: Rolling walker (2 wheeled) Transfers: Sit to/from Stand, Lateral/Scoot Transfers Sit to Stand: +2 physical assistance, Mod assist  Lateral/Scoot Transfers: Min guard, From elevated surface General transfer comment: pt held onto RW and unable to achieve full standing position due to weakness per pt, set up room for lateral/scoot transfer from bed to drop arm commode to drop arm recliner      ADL: ADL Overall ADL's :  Needs assistance/impaired Eating/Feeding: Independent, Bed level Grooming: Sitting, Supervision/safety Upper Body Bathing: Sitting, Minimal assitance Lower Body Bathing: Sitting/lateral leans, Moderate assistance Upper Body Dressing : Supervision/safety, Sitting Lower Body Dressing: Moderate assistance, Sit to/from stand Toilet Transfer: Min guard, Requires drop arm, Cueing for sequencing, Cueing for safety (lateral transfer) Toileting- Clothing Manipulation and Hygiene: Supervision/safety, Sitting/lateral lean  Cognition: Cognition Overall Cognitive Status: Within Functional Limits for tasks assessed Orientation Level: Oriented X4 Cognition Arousal/Alertness: Awake/alert Behavior During Therapy: WFL for tasks assessed/performed Overall Cognitive Status: Within Functional Limits for tasks assessed   Blood pressure 166/85, pulse 87, temperature 98.9 F (37.2 C), temperature source Oral, resp. rate 18, SpO2 93 %. Physical Exam   Constitutional: She is oriented to person, place, and time. She appears well-developed and well-nourished.  Alert sitting in  bed. Morbidly obese.  HENT:  Head: Normocephalic and atraumatic.  Mouth/Throat: Oropharynx is clear and moist.  Eyes: Conjunctivae are normal. Pupils are equal, round, and reactive to light.  Neck: Normal range of motion. Neck supple.  Trach site intact  Cardiovascular: Normal rate and regular rhythm. Exam reveals no friction rub.  No murmur heard. Respiratory: Effort normal and breath sounds normal. No stridor. No respiratory distress. She has no wheezes.  Trach device in place. GI: Soft. Bowel sounds are normal. She exhibits no distension. There is no tenderness.  Musculoskeletal: She exhibits edema (1+ edema RLE and moderate edema L-BKA.).  Contractures bilateral knees with arthritic changes noted RLE. L-BKA with compressive dressing and flexed/elevated on pillow. Varus deformities both knees. Able to extend fully with PROM.  Neurological: She is alert and oriented to person, place, and time. No cranial nerve deficit. She exhibits normal muscle tone. Coordination normal.  Able to communicate with mechanical laryngoscopy device. UE 5/5. RLE: 4/5 prox to distal. Unable to lift left leg off bed yet. Decreased LT below right knee.  Skin: Skin is warm and dry.  GU: vaginal yeast Psychiatric: Her mood appears slightly anxious. She's cooperative  Results for orders placed or performed during the hospital encounter of 09/09/15 (from the past 48 hour(s))  Glucose, capillary     Status: None   Collection Time: 09/13/15  3:10 PM  Result Value Ref Range   Glucose-Capillary 93 65 - 99 mg/dL  Glucose, capillary     Status: Abnormal   Collection Time: 09/13/15  7:06 PM  Result Value Ref Range   Glucose-Capillary 197 (H) 65 - 99 mg/dL  Glucose, capillary     Status: Abnormal   Collection Time: 09/13/15  8:17 PM  Result Value Ref Range   Glucose-Capillary 184 (H) 65 - 99 mg/dL  Basic metabolic panel     Status: Abnormal   Collection Time: 09/14/15  3:33 AM  Result Value Ref Range    Sodium 140 135 - 145 mmol/L   Potassium 3.8 3.5 - 5.1 mmol/L   Chloride 106 101 - 111 mmol/L   CO2 30 22 - 32 mmol/L   Glucose, Bld 122 (H) 65 - 99 mg/dL   BUN 15 6 - 20 mg/dL   Creatinine, Ser 1.22 (H) 0.44 - 1.00 mg/dL   Calcium 8.4 (L) 8.9 - 10.3 mg/dL   GFR calc non Af Amer 48 (L) >60 mL/min   GFR calc Af Amer 56 (L) >60 mL/min    Comment: (NOTE) The eGFR has been calculated using the CKD EPI equation. This calculation has not been validated in all clinical situations. eGFR's persistently <60 mL/min signify possible Chronic Kidney Disease.  Anion gap 4 (L) 5 - 15  Glucose, capillary     Status: Abnormal   Collection Time: 09/14/15  5:46 AM  Result Value Ref Range   Glucose-Capillary 106 (H) 65 - 99 mg/dL  Glucose, capillary     Status: Abnormal   Collection Time: 09/14/15 11:48 AM  Result Value Ref Range   Glucose-Capillary 121 (H) 65 - 99 mg/dL  Glucose, capillary     Status: Abnormal   Collection Time: 09/14/15  4:42 PM  Result Value Ref Range   Glucose-Capillary 156 (H) 65 - 99 mg/dL   Comment 1 Notify RN    Comment 2 Document in Chart   Glucose, capillary     Status: Abnormal   Collection Time: 09/14/15  9:56 PM  Result Value Ref Range   Glucose-Capillary 169 (H) 65 - 99 mg/dL   Comment 1 Notify RN    Comment 2 Document in Chart   Basic metabolic panel     Status: Abnormal   Collection Time: 09/15/15  3:56 AM  Result Value Ref Range   Sodium 142 135 - 145 mmol/L   Potassium 3.9 3.5 - 5.1 mmol/L   Chloride 105 101 - 111 mmol/L   CO2 30 22 - 32 mmol/L   Glucose, Bld 98 65 - 99 mg/dL   BUN 12 6 - 20 mg/dL   Creatinine, Ser 1.26 (H) 0.44 - 1.00 mg/dL   Calcium 8.7 (L) 8.9 - 10.3 mg/dL   GFR calc non Af Amer 46 (L) >60 mL/min   GFR calc Af Amer 54 (L) >60 mL/min    Comment: (NOTE) The eGFR has been calculated using the CKD EPI equation. This calculation has not been validated in all clinical situations. eGFR's persistently <60 mL/min signify possible Chronic  Kidney Disease.    Anion gap 7 5 - 15  Glucose, capillary     Status: None   Collection Time: 09/15/15  6:07 AM  Result Value Ref Range   Glucose-Capillary 94 65 - 99 mg/dL   Comment 1 Notify RN    Comment 2 Document in Chart   Glucose, capillary     Status: Abnormal   Collection Time: 09/15/15 11:43 AM  Result Value Ref Range   Glucose-Capillary 122 (H) 65 - 99 mg/dL   No results found.     Medical Problem List and Plan: 1.  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  DVT Prophylaxis/Anticoagulation: Pharmaceutical: Lovenox 3. Chronic pain/Pain Management: Was on Suboxone tid with lyrica qid (has been refusing).  Post op on percocet, oxycodone and IV dilaudid to help with pain and baclofen tid to help manage spams.  Will change to po dilaudid prn to simplify regimen. Continue suboxone 4. Mood: Team to provide ego support. LCSW to follow for evaluation and support.  5. Neuropsych: This patient is capable of making decisions on her own behalf. 6. Skin/Wound Care: routine pressure relief measures.  Continue compressive dressing L-BKA for 7 days per protocol 7. Fluids/Electrolytes/Nutrition: Monitor I/O. Check lytes in am.  8. DM type 2 with insensate neuropathy: Monitor BS ac/hs. Continue lantus insulin at bedtime with SSI for tighter control.  Titrate medications as indicated.  9. COPD with SOB/OSA: Does not use CPAP. 10 Morbid obesity: Encourage weight loss diet. 11. HTN: Monitor BP bid. Continue Norvasc daily.  12. Candida vaginitis:  Treat with diflucan X 2 doses.       Post Admission Physician Evaluation: 1. Functional deficits secondary  to left BKA. 2.  Patient is admitted to receive collaborative, interdisciplinary care between the physiatrist, rehab nursing staff, and therapy team. 3. Patient's level of medical complexity and substantial therapy needs in context of that medical necessity cannot be provided at a lesser intensity of care such as a  SNF. 4. Patient has experienced substantial functional loss from his/her baseline which was documented above under the "Functional History" and "Functional Status" headings.  Judging by the patient's diagnosis, physical exam, and functional history, the patient has potential for functional progress which will result in measurable gains while on inpatient rehab.  These gains will be of substantial and practical use upon discharge  in facilitating mobility and self-care at the household level. 5. Physiatrist will provide 24 hour management of medical needs as well as oversight of the therapy plan/treatment and provide guidance as appropriate regarding the interaction of the two. 6. 24 hour rehab nursing will assist with bladder management, bowel management, safety, skin/wound care, disease management, medication administration, pain management and patient education  and help integrate therapy concepts, techniques,education, etc. 7. PT will assess and treat for/with: Lower extremity strength, range of motion, stamina, balance, functional mobility, safety, adaptive techniques and equipment, pre-prosthetic education, pain mgt, skin care, ego suppor.   Goals are: mod I. 8. OT will assess and treat for/with: ADL's, functional mobility, safety, upper extremity strength, adaptive techniques and equipment, wound care, ego support.   Goals are: mod I. Therapy may not yet proceed with showering this patient. 9. SLP will assess and treat for/with: n/a.  Goals are: n/a. 10. Case Management and Social Worker will assess and treat for psychological issues and discharge planning. 11. Team conference will be held weekly to assess progress toward goals and to determine barriers to discharge. 12. Patient will receive at least 3 hours of therapy per day at least 5 days per week. 13. ELOS: 8-13 days       14. Prognosis:  excellent     Meredith Staggers, MD, [facility] 09/15/2015   09/15/2015

## 2015-09-15 NOTE — PMR Pre-admission (Signed)
PMR Admission Coordinator Pre-Admission Assessment  Patient: [name] is an 60 y.o., female MRN: [id number] DOB: 15-Jan-1956 Height:   Weight:                Insurance Information  PRIMARY: Medicaid [place] [employer]      Policy#: [id number]      Subscriber: pt  Medicaid Application Date:       Case Manager:  Disability Application Date:       Case Worker:   Emergency Contact Information Contact Information    Name Relation Home Work [place] Spouse   [phone]   Jennfier, Abdulla   [phone]     Current Medical History  Patient Admitting Diagnosis: Left BKA  History of Present Illness: [name] is a 60 y.o. 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 She elected to go Home and was admitted on 07/15 pm with fever and SIR reaction due to wound infection. She was started on broad spectrum antibiotics and MRI foot showed edema but no evidence of osteo. Wound continue to decline and she underwent L-BKA by Dr. Sharol Given on 09/13/15. Post op with acute on chronic pain with high levels of anxiety. Dilaudid discontinued and percocet scheduled to help with pain management.     Past Medical History  Past Medical History  Diagnosis Date  . Hypertension   . COPD (chronic obstructive pulmonary disease) ([place])   . Shortness of breath   . Arthritis   . Diabetic neuropathy ([place])   . Rash, skin     face  . Kidney stone   . Sleep apnea     has not used cpap past 10 yrs  . Nocturia   . Urge incontinence   . Morbid obesity with BMI of 40.0-44.9, adult ([place])   . Renal insufficiency   . Diabetes mellitus without complication ([place])     INSULIN DEPENDENT  . Neuropathy ([place])   . Head and neck cancer ([place]) 04/19/2015  . Pneumonia    . Depression   . Anxiety   . Fibromyalgia   . Difficult intubation     total laryngectomy    Family History  family history is not on file. She was adopted.  Prior Rehab/Hospitalizations:  Has the patient had major surgery during 100 days prior to admission? Yes  Current Medications   Current facility-administered medications:  .  albuterol (PROVENTIL) (2.5 MG/3ML) 0.083% nebulizer solution 2.5 mg, 2.5 mg, Nebulization, Q4H PRN, Blane Ohara McDiarmid, MD .  amLODipine (NORVASC) tablet 10 mg, 10 mg, Oral, Daily, Mercy Riding, MD, 10 mg at 09/15/15 0931 .  aspirin EC tablet 81 mg, 81 mg, Oral, Daily, Mercy Riding, MD, 81 mg at 09/15/15 0930 .  baclofen (LIORESAL) tablet 5 mg, 5 mg, Oral, TID, Eloise Levels, MD, 5 mg at 09/15/15 0931 .  buprenorphine (SUBUTEX) SL tablet 8 mg, 8 mg, Sublingual, TID, Mercy Riding, MD, 8 mg at 09/15/15 1323 .  docusate sodium (COLACE) capsule 100 mg, 100 mg, Oral, BID, Lanae Crumbly, PA-C, 100 mg at 09/15/15 0931 .  enoxaparin (LOVENOX) injection 40 mg, 40 mg, Subcutaneous, Q24H, Mercy Riding, MD, 40 mg at 09/14/15 2044 .  fluconazole (DIFLUCAN) tablet 150 mg, 150 mg, Oral, Daily, Verner Mould, MD,  150 mg at 09/15/15 1323 .  ibuprofen (ADVIL,MOTRIN) tablet 600 mg, 600 mg, Oral, Q6H PRN, Blane Ohara McDiarmid, MD, 600 mg at 09/15/15 1323 .  insulin aspart (novoLOG) injection 0-15 Units, 0-15 Units, Subcutaneous, TID WC, Mercy Riding, MD, 2 Units at 09/15/15 1323 .  insulin aspart (novoLOG) injection 0-5 Units, 0-5 Units, Subcutaneous, QHS, Mercy Riding, MD, 0 Units at 09/09/15 2300 .  insulin glargine (LANTUS) injection 17 Units, 17 Units, Subcutaneous, QHS, Mercy Riding, MD, 17 Units at 09/14/15 2214 .  lactated ringers infusion, , Intravenous, Continuous, Oleta Mouse, MD .  metoCLOPramide (REGLAN) tablet 5-10 mg, 5-10 mg, Oral, Q8H PRN **OR** metoCLOPramide (REGLAN) injection 5-10 mg, 5-10 mg, Intravenous, Q8H PRN, Lanae Crumbly, PA-C .   nystatin (MYCOSTATIN) 100000 UNIT/ML suspension 500,000 Units, 5 mL, Oral, QID, Marybelle Killings, MD, 500,000 Units at 09/15/15 1324 .  ondansetron (ZOFRAN) tablet 4 mg, 4 mg, Oral, Q6H PRN **OR** ondansetron (ZOFRAN) injection 4 mg, 4 mg, Intravenous, Q6H PRN, Lanae Crumbly, PA-C, 4 mg at 09/14/15 1317 .  oxyCODONE (Oxy IR/ROXICODONE) immediate release tablet 5 mg, 5 mg, Oral, Q4H PRN, Lanae Crumbly, PA-C, 5 mg at 09/15/15 1434 .  oxyCODONE-acetaminophen (PERCOCET/ROXICET) 5-325 MG per tablet 1 tablet, 1 tablet, Oral, Q6H, Vivi Barrack, MD, 1 tablet at 09/15/15 1145 .  piperacillin-tazobactam (ZOSYN) IVPB 3.375 g, 3.375 g, Intravenous, Q8H, Todd D McDiarmid, MD, 3.375 g at 09/15/15 1323 .  pregabalin (LYRICA) capsule 75 mg, 75 mg, Oral, QID, Mercy Riding, MD, 75 mg at 09/14/15 2045 .  senna (SENOKOT) tablet 8.6 mg, 1 tablet, Oral, Daily PRN, Eloise Levels, MD, 8.6 mg at 09/15/15 1326 .  vancomycin (VANCOCIN) IVPB 750 mg/150 ml premix, 750 mg, Intravenous, Q12H, Blane Ohara McDiarmid, MD, 750 mg at 09/15/15 0932  Patients Current Diet: Diet Carb Modified Fluid consistency:: Thin; Room service appropriate?: Yes  Precautions / Restrictions Precautions Precautions: Fall Other Brace/Splint: adjusted metal stay to lateral side of knee to decrease pressure and pain on end of L LE Restrictions Weight Bearing Restrictions: Yes LLE Weight Bearing: Non weight bearing   Has the patient had 2 or more falls or a fall with injury in the past year?No  Prior Activity Level Limited Community (1-2x/wk): limited since transmet amputation  Development worker, international aid / Equipment Home Assistive Devices/Equipment: Wheelchair, Radio producer (specify quad or straight), Eyeglasses, Dentures (specify type), Walker (specify type), CBG Meter, Other (Comment), Bedside commode/3-in-1, Shower chair without back, Grab bars in shower, Hand-held shower hose Home Equipment: Environmental consultant - 2 wheels, Wheelchair - manual, Bedside commode, Shower seat,  Hand held shower head, Adaptive equipment  Prior Device Use: Indicate devices/aids used by the patient prior to current illness, exacerbation or injury? Manual wheelchair  Prior Functional Level Prior Function Level of Independence: Needs assistance Gait / Transfers Assistance Needed: was primarily w/c dependent due to NWB status on L LE and weakness in R LE  ADL's / Homemaking Assistance Needed: set up for sponge bathing and dressing leaning side to side, assist for IADL Comments: unable to get w/c into bathroom door  Self Care: Did the patient need help bathing, dressing, using the toilet or eating?  Needed some help  Indoor Mobility: Did the patient need assistance with walking from room to room (with or without device)? Needed some help  Stairs: Did the patient need assistance with internal or external stairs (with or without device)? Needed some help  Functional Cognition: Did the patient need help  planning regular tasks such as shopping or remembering to take medications? Needed some help  Current Functional Level Cognition  Overall Cognitive Status: Within Functional Limits for tasks assessed Orientation Level: Oriented X4    Extremity Assessment (includes Sensation/Coordination)  Upper Extremity Assessment: Generalized weakness  Lower Extremity Assessment: RLE deficits/detail, LLE deficits/detail RLE Deficits / Details: unable to achieve full PROM extension - lacking approx 5* LLE Deficits / Details: pt reports spinal not fully worn off yet, poor quad set, able to peform full PROM extension, adjusted KI for comfort, pt aware to wear at all times    ADLs  Overall ADL's : Needs assistance/impaired Eating/Feeding: Independent, Bed level Grooming: Sitting, Supervision/safety Upper Body Bathing: Sitting, Minimal assitance Lower Body Bathing: Sitting/lateral leans, Moderate assistance Upper Body Dressing : Supervision/safety, Sitting Lower Body Dressing: Moderate assistance,  Sit to/from stand Toilet Transfer: Min guard, Requires drop arm, Cueing for sequencing, Cueing for safety (lateral transfer) Toileting- Clothing Manipulation and Hygiene: Supervision/safety, Sitting/lateral lean    Mobility  Overal bed mobility: Needs Assistance Bed Mobility: Supine to Sit Supine to sit: Supervision, HOB elevated General bed mobility comments: with rail    Transfers  Overall transfer level: Needs assistance Equipment used: Rolling walker (2 wheeled) Transfers: Sit to/from Stand, Lateral/Scoot Transfers Sit to Stand: +2 physical assistance, Mod assist  Lateral/Scoot Transfers: Min guard, From elevated surface General transfer comment: pt held onto RW and unable to achieve full standing position due to weakness per pt, set up room for lateral/scoot transfer from bed to drop arm commode to drop arm recliner    Ambulation / Gait / Stairs / Wheelchair Mobility       Posture / Balance Balance Overall balance assessment: Needs assistance Sitting balance-Leahy Scale: Fair Standing balance-Leahy Scale: Zero    Special needs/care consideration  Skin surgical incision Bowel mgmt: reports no BM since 7/14. Has chronic constipation issues Bladder mgmt: conitnent Diabetic mgmt yes Has electro larynx since 04/2015   Previous Home Environment Living Arrangements: Spouse/significant other, Other (Comment) (son also lives with pt and her spouse)  Lives With: Spouse, Son Available Help at Discharge: Family, Available 24 hours/day Type of Home: House Home Layout: One level Home Access: Stairs to enter Entrance Stairs-Rails: None Entrance Stairs-Number of Steps: 1 Bathroom Shower/Tub: Public librarian, Architectural technologist: Standard Bathroom Accessibility: No (accessible by walker, not wheelchair) [place]: Yes Type of [place]: [place] (if known): Wellness   Discharge Living Setting Plans for Discharge Living Setting: Patient's  home, Lives with (comment) (spouse and son) Type of Home at Discharge: House Discharge Home Layout: One level Discharge Home Access: Stairs to enter Entrance Stairs-Rails: None Entrance Stairs-Number of Steps: 1 step onto porch Discharge Bathroom Shower/Tub: Tub/shower unit, Curtain Discharge Bathroom Toilet: Standard Discharge Bathroom Accessibility: Yes How Accessible: Accessible via walker (not accessible with w/c) Does the patient have any problems obtaining your medications?: No  Social/Family/Support Systems Patient Roles: Spouse, Parent Contact Information: spouse Pilar Plate and son, Denyse Amass Anticipated Caregiver: spouse and son Anticipated Caregiver's Contact Information: see above Ability/Limitations of Caregiver: no limitations. spouse unemployed Building control surveyor Availability: 24/7 Discharge Plan Discussed with Primary Caregiver: Yes Is Caregiver In Agreement with Plan?: Yes Does Caregiver/Family have Issues with Lodging/Transportation while Pt is in Rehab?: No (son stays with pt 24/7 inhospital)   Goals/Additional Needs Patient/Family Goal for Rehab: Mod I with PT and OT Expected length of stay: ELOS 7-10 days Equipment Needs: Pt has electro larynx sonce 04/2015 after surgery for neck  and throat cancer Special Service Needs: needs son's assist at time with communication Pt/Family Agrees to Admission and willing to participate: Yes Program Orientation Provided & Reviewed with Pt/Caregiver Including Roles  & Responsibilities: Yes   Decrease burden of Care through IP rehab admission: n/a  Possible need for SNF placement upon discharge: not anticipated  Patient Condition: This patient's condition remains as documented in the consult dated 09/15/2015, in which the Rehabilitation Physician determined and documented that the patient's condition is appropriate for intensive rehabilitative care in an inpatient rehabilitation facility. Will admit to inpatient rehab today.  Preadmission Screen  Completed By:  Cleatrice Burke, 09/15/2015 3:03 PM ______________________________________________________________________   Discussed status with Dr. Naaman Plummer on 09/15/2015 at  1502 and received telephone approval for admission today.  Admission Coordinator:  Cleatrice Burke, time [id number] Date 09/15/2015

## 2015-09-15 NOTE — Progress Notes (Addendum)
Subjective: Doing ok.  Pain left stump better but states she needs more pain medication.    Objective: Vital signs in last 24 hours: Temp:  [98.8 F (37.1 C)-99.7 F (37.6 C)] 98.9 F (37.2 C) (07/21 0626) Pulse Rate:  [82-100] 87 (07/21 0626) Resp:  [18-20] 18 (07/21 0626) BP: (145-166)/(83-85) 166/85 mmHg (07/21 0626) SpO2:  [93 %-97 %] 93 % (07/21 0626)  Intake/Output from previous day: 07/20 0701 - 07/21 0700 In: 810 [P.O.:810] Out: 600 [Urine:600] Intake/Output this shift:    No results for input(s): HGB in the last 72 hours. No results for input(s): WBC, RBC, HCT, PLT in the last 72 hours.  Recent Labs  09/14/15 0333 09/15/15 0356  NA 140 142  K 3.8 3.9  CL 106 105  CO2 30 30  BUN 15 12  CREATININE 1.22* 1.26*  GLUCOSE 122* 98  CALCIUM 8.4* 8.7*   No results for input(s): LABPT, INR in the last 72 hours.  Exam:  Dressing c/d/i.  Alert and oriented.   Assessment/Plan: Agree with CIR recommendations.  Stable from ortho standpoint to transfer if accepted. D/c dilaudid. Finish 48 hours of iv abx and will discontinue this evening.  Per Dr Lorin Mercy no po abx after.    [name] M 09/15/2015, 9:43 AM

## 2015-09-15 NOTE — H&P (View-Only) (Signed)
Physical Medicine and Rehabilitation Admission H&P    Chief Complaint  Patient presents with  . L-BKA    HPI: [name] is a 59 y.o. 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 She elected to go Home and was admitted on 07/15 pm with fever and SIR reaction due to wound infection. She was started on broad spectrum antibiotics and MRI foot showed edema but no evidence of osteo. Wound continue to decline and she underwent L-BKA by Dr. Sharol Given on 09/13/15. Post op with acute on chronic pain with high levels of anxiety. Dilaudid discontinued and percocet scheduled to help with pain management. She completes antibiotic regimen today and     ROS    Past Medical History  Diagnosis Date  . Hypertension   . COPD (chronic obstructive pulmonary disease) ([place])   . Shortness of breath   . Arthritis   . Diabetic neuropathy ([place])   . Rash, skin     face  . Kidney stone   . Sleep apnea     has not used cpap past 10 yrs  . Nocturia   . Urge incontinence   . Morbid obesity with BMI of 40.0-44.9, adult ([place])   . Renal insufficiency   . Diabetes mellitus without complication ([place])     INSULIN DEPENDENT  . Neuropathy ([place])   . Head and neck cancer ([place]) 04/19/2015  . Pneumonia   . Depression   . Anxiety   . Fibromyalgia   . Difficult intubation     total laryngectomy    Past Surgical History  Procedure Laterality Date  . Cholecystectomy    . Tubal ligation    . Carpel tunnel    . Cystoscopy w/ ureteral stent placement Left 03/22/2014    Procedure: CYSTOSCOPY WITH RETROGRADE PYELOGRAM/URETERAL STENT PLACEMENT;  Surgeon: Alexis Frock, MD;  Location: [facility];  Service: Urology;  Laterality: Left;  . Salivary stone removal    . Cystoscopy  with retrograde pyelogram, ureteroscopy and stent placement Left 05/11/2014    Procedure: [place], URETEROSCOPY AND STENT PLACEMENT;  Surgeon: Alexis Frock, MD;  Location: [facility];  Service: Urology;  Laterality: Left;  . Holmium laser application Left 1/61/0960    Procedure: HOLMIUM LASER APPLICATION;  Surgeon: Alexis Frock, MD;  Location: [facility];  Service: Urology;  Laterality: Left;  . Brain surgery      vertebral aneurysm  . Tonsillectomy    . Laryngectomy      total ( stoma)  . Amputation Left 08/25/2015    Procedure: 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;  Surgeon: Newt Minion, MD;  Location: [facility];  Service: Orthopedics;  Laterality: Left;  . Breast lumpectomy  1982    benign  . Leg amputation below knee Left 09/13/2015  . Amputation Left 09/13/2015    Procedure: AMPUTATION BELOW KNEE;  Surgeon: Marybelle Killings, MD;  Location: [facility];  Service: Orthopedics;  Laterality: Left;    Family History  Problem Relation Age of Onset  . 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 She lives with son and was able to ambulate with cane PTA. She reports that she quit smoking 05/22/15.Marland Kitchen Her smoking use included Cigarettes. She has a 40  pack-year smoking history. She has never used smokeless tobacco. She reports that she does not drink alcohol or use illicit drugs.    Allergies  Allergen Reactions  . Ace Inhibitors Anaphylaxis and Swelling    Angioedema  . Zestril [Lisinopril] Anaphylaxis and Swelling    Angioedema    Medications Prior to Admission  Medication Sig Dispense Refill  . albuterol (PROVENTIL HFA;VENTOLIN HFA) 108 (90 BASE) MCG/ACT inhaler Inhale 2 puffs into the lungs every 4 (four) hours as needed for wheezing or shortness of breath. 18 g 1  . amLODipine (NORVASC) 10 MG tablet Take 1 tablet (10 mg total) by mouth daily. 30 tablet 0  . blood glucose meter kit and supplies KIT Dispense based on patient and insurance  preference. Use up to four times daily as directed. (FOR ICD-9 250.00, 250.01). 1 each 0  . Buprenorphine HCl-Naloxone HCl (SUBOXONE) 8-2 MG FILM Place 1 Film under the tongue 3 (three) times daily.     . cyclobenzaprine (FLEXERIL) 10 MG tablet Take 10 mg by mouth at bedtime as needed for muscle spasms.    Marland Kitchen docusate sodium (COLACE) 100 MG capsule Take 100 mg by mouth daily as needed for mild constipation.    Marland Kitchen ibuprofen (ADVIL,MOTRIN) 200 MG tablet Take 400-600 mg by mouth every 6 (six) hours as needed for fever, headache or moderate pain.     Marland Kitchen insulin aspart (NOVOLOG FLEXPEN) 100 UNIT/ML FlexPen Inject 3 Units into the skin 3 (three) times daily with meals.    . Insulin Glargine (LANTUS SOLOSTAR) 100 UNIT/ML Solostar Pen Inject 34 Units into the skin daily. At 10AM (Patient taking differently: Inject 30 Units into the skin every morning. At 10AM) 15 mL 3  . Insulin Pen Needle (PEN NEEDLES 3/16") 31G X 5 MM MISC Use as directed. 100 each 3  . Multiple Vitamin (MULTIVITAMIN) tablet Take 1 tablet by mouth daily with supper.     . pregabalin (LYRICA) 75 MG capsule Take 75 mg by mouth 4 (four) times daily.      Home: Home Living Family/patient expects to be discharged to:: Private residence Living Arrangements: Spouse/significant other, Children (adult son) Available Help at Discharge: Family, Available 24 hours/day Type of Home: House Home Access: Stairs to enter [employer] of Steps: 1 Entrance Stairs-Rails: None Home Layout: One level (one step to get down into kitchen) Bathroom Shower/Tub: Chiropodist: Standard Bathroom Accessibility: No (w/c won't fit) Home Equipment: Environmental consultant - 2 wheels, Wheelchair - manual, Bedside commode, Shower seat, Hand held shower head, Radiation protection practitioner Equipment: Reacher   Functional History: Prior Function Level of Independence: Needs assistance Gait / Transfers Assistance Needed: was primarily w/c dependent due to  NWB status on L LE and weakness in R LE  ADL's / Homemaking Assistance Needed: set up for sponge bathing and dressing leaning side to side, assist for IADL  Functional Status:  Mobility: Bed Mobility Overal bed mobility: Needs Assistance Bed Mobility: Supine to Sit Supine to sit: Supervision, HOB elevated General bed mobility comments: with rail Transfers Overall transfer level: Needs assistance Equipment used: Rolling walker (2 wheeled) Transfers: Sit to/from Stand, Lateral/Scoot Transfers Sit to Stand: +2 physical assistance, Mod assist  Lateral/Scoot Transfers: Min guard, From elevated surface General transfer comment: pt held onto RW and unable to achieve full standing position due to weakness per pt, set up room for lateral/scoot transfer from bed to drop arm commode to drop arm recliner      ADL: ADL Overall ADL's :  Needs assistance/impaired Eating/Feeding: Independent, Bed level Grooming: Sitting, Supervision/safety Upper Body Bathing: Sitting, Minimal assitance Lower Body Bathing: Sitting/lateral leans, Moderate assistance Upper Body Dressing : Supervision/safety, Sitting Lower Body Dressing: Moderate assistance, Sit to/from stand Toilet Transfer: Min guard, Requires drop arm, Cueing for sequencing, Cueing for safety (lateral transfer) Toileting- Clothing Manipulation and Hygiene: Supervision/safety, Sitting/lateral lean  Cognition: Cognition Overall Cognitive Status: Within Functional Limits for tasks assessed Orientation Level: Oriented X4 Cognition Arousal/Alertness: Awake/alert Behavior During Therapy: WFL for tasks assessed/performed Overall Cognitive Status: Within Functional Limits for tasks assessed   Blood pressure 166/85, pulse 87, temperature 98.9 F (37.2 C), temperature source Oral, resp. rate 18, SpO2 93 %. Physical Exam   Constitutional: She is oriented to person, place, and time. She appears well-developed and well-nourished.  Alert sitting in  bed. Morbidly obese.  HENT:  Head: Normocephalic and atraumatic.  Mouth/Throat: Oropharynx is clear and moist.  Eyes: Conjunctivae are normal. Pupils are equal, round, and reactive to light.  Neck: Normal range of motion. Neck supple.  Trach site intact  Cardiovascular: Normal rate and regular rhythm. Exam reveals no friction rub.  No murmur heard. Respiratory: Effort normal and breath sounds normal. No stridor. No respiratory distress. She has no wheezes.  Trach device in place. GI: Soft. Bowel sounds are normal. She exhibits no distension. There is no tenderness.  Musculoskeletal: She exhibits edema (1+ edema RLE and moderate edema L-BKA.).  Contractures bilateral knees with arthritic changes noted RLE. L-BKA with compressive dressing and flexed/elevated on pillow. Varus deformities both knees. Able to extend fully with PROM.  Neurological: She is alert and oriented to person, place, and time. No cranial nerve deficit. She exhibits normal muscle tone. Coordination normal.  Able to communicate with mechanical laryngoscopy device. UE 5/5. RLE: 4/5 prox to distal. Unable to lift left leg off bed yet. Decreased LT below right knee.  Skin: Skin is warm and dry.  GU: vaginal yeast Psychiatric: Her mood appears slightly anxious. She's cooperative  Results for orders placed or performed during the hospital encounter of 09/09/15 (from the past 48 hour(s))  Glucose, capillary     Status: None   Collection Time: 09/13/15  3:10 PM  Result Value Ref Range   Glucose-Capillary 93 65 - 99 mg/dL  Glucose, capillary     Status: Abnormal   Collection Time: 09/13/15  7:06 PM  Result Value Ref Range   Glucose-Capillary 197 (H) 65 - 99 mg/dL  Glucose, capillary     Status: Abnormal   Collection Time: 09/13/15  8:17 PM  Result Value Ref Range   Glucose-Capillary 184 (H) 65 - 99 mg/dL  Basic metabolic panel     Status: Abnormal   Collection Time: 09/14/15  3:33 AM  Result Value Ref Range    Sodium 140 135 - 145 mmol/L   Potassium 3.8 3.5 - 5.1 mmol/L   Chloride 106 101 - 111 mmol/L   CO2 30 22 - 32 mmol/L   Glucose, Bld 122 (H) 65 - 99 mg/dL   BUN 15 6 - 20 mg/dL   Creatinine, Ser 1.22 (H) 0.44 - 1.00 mg/dL   Calcium 8.4 (L) 8.9 - 10.3 mg/dL   GFR calc non Af Amer 48 (L) >60 mL/min   GFR calc Af Amer 56 (L) >60 mL/min    Comment: (NOTE) The eGFR has been calculated using the CKD EPI equation. This calculation has not been validated in all clinical situations. eGFR's persistently <60 mL/min signify possible Chronic Kidney Disease.  Anion gap 4 (L) 5 - 15  Glucose, capillary     Status: Abnormal   Collection Time: 09/14/15  5:46 AM  Result Value Ref Range   Glucose-Capillary 106 (H) 65 - 99 mg/dL  Glucose, capillary     Status: Abnormal   Collection Time: 09/14/15 11:48 AM  Result Value Ref Range   Glucose-Capillary 121 (H) 65 - 99 mg/dL  Glucose, capillary     Status: Abnormal   Collection Time: 09/14/15  4:42 PM  Result Value Ref Range   Glucose-Capillary 156 (H) 65 - 99 mg/dL   Comment 1 Notify RN    Comment 2 Document in Chart   Glucose, capillary     Status: Abnormal   Collection Time: 09/14/15  9:56 PM  Result Value Ref Range   Glucose-Capillary 169 (H) 65 - 99 mg/dL   Comment 1 Notify RN    Comment 2 Document in Chart   Basic metabolic panel     Status: Abnormal   Collection Time: 09/15/15  3:56 AM  Result Value Ref Range   Sodium 142 135 - 145 mmol/L   Potassium 3.9 3.5 - 5.1 mmol/L   Chloride 105 101 - 111 mmol/L   CO2 30 22 - 32 mmol/L   Glucose, Bld 98 65 - 99 mg/dL   BUN 12 6 - 20 mg/dL   Creatinine, Ser 1.26 (H) 0.44 - 1.00 mg/dL   Calcium 8.7 (L) 8.9 - 10.3 mg/dL   GFR calc non Af Amer 46 (L) >60 mL/min   GFR calc Af Amer 54 (L) >60 mL/min    Comment: (NOTE) The eGFR has been calculated using the CKD EPI equation. This calculation has not been validated in all clinical situations. eGFR's persistently <60 mL/min signify possible Chronic  Kidney Disease.    Anion gap 7 5 - 15  Glucose, capillary     Status: None   Collection Time: 09/15/15  6:07 AM  Result Value Ref Range   Glucose-Capillary 94 65 - 99 mg/dL   Comment 1 Notify RN    Comment 2 Document in Chart   Glucose, capillary     Status: Abnormal   Collection Time: 09/15/15 11:43 AM  Result Value Ref Range   Glucose-Capillary 122 (H) 65 - 99 mg/dL   No results found.     Medical Problem List and Plan: 1.  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  DVT Prophylaxis/Anticoagulation: Pharmaceutical: Lovenox 3. Chronic pain/Pain Management: Was on Suboxone tid with lyrica qid (has been refusing).  Post op on percocet, oxycodone and IV dilaudid to help with pain and baclofen tid to help manage spams.  Will change to po dilaudid prn to simplify regimen. Continue suboxone 4. Mood: Team to provide ego support. LCSW to follow for evaluation and support.  5. Neuropsych: This patient is capable of making decisions on her own behalf. 6. Skin/Wound Care: routine pressure relief measures.  Continue compressive dressing L-BKA for 7 days per protocol 7. Fluids/Electrolytes/Nutrition: Monitor I/O. Check lytes in am.  8. DM type 2 with insensate neuropathy: Monitor BS ac/hs. Continue lantus insulin at bedtime with SSI for tighter control.  Titrate medications as indicated.  9. COPD with SOB/OSA: Does not use CPAP. 10 Morbid obesity: Encourage weight loss diet. 11. HTN: Monitor BP bid. Continue Norvasc daily.  12. Candida vaginitis:  Treat with diflucan X 2 doses.       Post Admission Physician Evaluation: 1. Functional deficits secondary  to left BKA. 2.  Patient is admitted to receive collaborative, interdisciplinary care between the physiatrist, rehab nursing staff, and therapy team. 3. Patient's level of medical complexity and substantial therapy needs in context of that medical necessity cannot be provided at a lesser intensity of care such as a  SNF. 4. Patient has experienced substantial functional loss from his/her baseline which was documented above under the "Functional History" and "Functional Status" headings.  Judging by the patient's diagnosis, physical exam, and functional history, the patient has potential for functional progress which will result in measurable gains while on inpatient rehab.  These gains will be of substantial and practical use upon discharge  in facilitating mobility and self-care at the household level. 5. Physiatrist will provide 24 hour management of medical needs as well as oversight of the therapy plan/treatment and provide guidance as appropriate regarding the interaction of the two. 6. 24 hour rehab nursing will assist with bladder management, bowel management, safety, skin/wound care, disease management, medication administration, pain management and patient education  and help integrate therapy concepts, techniques,education, etc. 7. PT will assess and treat for/with: Lower extremity strength, range of motion, stamina, balance, functional mobility, safety, adaptive techniques and equipment, pre-prosthetic education, pain mgt, skin care, ego suppor.   Goals are: mod I. 8. OT will assess and treat for/with: ADL's, functional mobility, safety, upper extremity strength, adaptive techniques and equipment, wound care, ego support.   Goals are: mod I. Therapy may not yet proceed with showering this patient. 9. SLP will assess and treat for/with: n/a.  Goals are: n/a. 10. Case Management and Social Worker will assess and treat for psychological issues and discharge planning. 11. Team conference will be held weekly to assess progress toward goals and to determine barriers to discharge. 12. Patient will receive at least 3 hours of therapy per day at least 5 days per week. 13. ELOS: 8-13 days       14. Prognosis:  excellent     Meredith Staggers, MD, [facility] 09/15/2015   09/15/2015

## 2015-09-15 NOTE — Progress Notes (Signed)
I met with pt and her son at bedside to discuss a possible inpt rehab admission today. They are both in agreement. I contacted Attending resident and we will admit her today. [phone]

## 2015-09-15 NOTE — Interval H&P Note (Signed)
[name] was admitted today to Inpatient Rehabilitation with the diagnosis of left BKA.  The patient's history has been reviewed, patient examined, and there is no change in status.  Patient continues to be appropriate for intensive inpatient rehabilitation.  I have reviewed the patient's chart and labs.  Questions were answered to the patient's satisfaction. The PAPE has been reviewed and assessment remains appropriate.  [name] T 09/15/2015, 7:50 PM

## 2015-09-15 NOTE — Progress Notes (Signed)
Meredith Staggers, MD Physician Signed Physical Medicine and Rehabilitation Consult Note 09/14/2015 10:42 AM  Related encounter: ED to Hosp-Admission (Discharged) from 09/09/2015 in [place] Collapse All        Physical Medicine and Rehabilitation Consult  Reason for Consult: L-BKA Referring Physician: Dr. Wendy Poet   HPI: [name] is a 60 y.o. 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 She elected to go Home and was admitted on 07/15 pm with fever and SIR reaction due to wound infection. She was started on broad spectrum antibiotics and MRI foot showed edema but no evidence of osteo. Wound continue to decline and she underwent L-BKA by Dr. Sharol Given on 09/13/15. OT/PT evaluations done today and CIR recommended for follow up therapy.   h  Review of Systems  HENT: Negative for hearing loss.  Eyes: Negative for blurred vision and double vision.  Respiratory: Negative for cough and shortness of breath.  Cardiovascular: Negative for chest pain and palpitations.  Gastrointestinal: Negative for heartburn and nausea.  Musculoskeletal: Positive for myalgias and falls.  Neurological: Positive for sensory change, focal weakness and weakness. Negative for dizziness and headaches.  Psychiatric/Behavioral: The patient is nervous/anxious.      Past Medical History  Diagnosis Date  . Hypertension   . COPD (chronic obstructive pulmonary disease) ([place])   . Shortness of breath   . Arthritis   . Diabetic neuropathy ([place])   . Rash, skin     face  . Kidney stone   . Sleep apnea     has not used cpap past 10 yrs  . Nocturia   . Urge incontinence   . Morbid obesity  with BMI of 40.0-44.9, adult ([place])   . Renal insufficiency   . Diabetes mellitus without complication ([place])     INSULIN DEPENDENT  . Neuropathy ([place])   . Head and neck cancer ([place]) 04/19/2015  . Pneumonia   . Depression   . Anxiety   . Fibromyalgia   . Difficult intubation     total laryngectomy    Past Surgical History  Procedure Laterality Date  . Cholecystectomy    . Tubal ligation    . Carpel tunnel    . Cystoscopy w/ ureteral stent placement Left 03/22/2014    Procedure: CYSTOSCOPY WITH RETROGRADE PYELOGRAM/URETERAL STENT PLACEMENT; Surgeon: Alexis Frock, MD; Location: [place]; Service: Urology; Laterality: Left;  . Salivary stone removal    . Cystoscopy with retrograde pyelogram, ureteroscopy and stent placement Left 05/11/2014    Procedure: [place], URETEROSCOPY AND STENT PLACEMENT; Surgeon: Alexis Frock, MD; Location: [facility]; Service: Urology; Laterality: Left;  . Holmium laser application Left 2/35/5732    Procedure: HOLMIUM LASER APPLICATION; Surgeon: Alexis Frock, MD; Location: [facility]; Service: Urology; Laterality: Left;  . Brain surgery      vertebral aneurysm  . Tonsillectomy    . Laryngectomy      total ( stoma)  . Amputation Left 08/25/2015    Procedure: 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; Surgeon: Newt Minion, MD; Location: [place]; Service: Orthopedics; Laterality: Left;  . Breast lumpectomy  1982    benign  . Leg amputation below knee Left 09/13/2015  . Amputation Left 09/13/2015    Procedure: AMPUTATION BELOW KNEE; Surgeon:  Marybelle Killings, MD; Location: [facility]; Service: Orthopedics; Laterality: Left;   Family History  Problem Relation Age of Onset  . Adopted: Yes    Social History: Has been disabled for 15 years--used to be a Marine scientist. She lives with son and was able to ambulate with cane  PTA. She reports that she quit smoking 05/22/15.Marland Kitchen Her smoking use included Cigarettes. She has a 40 pack-year smoking history. She has never used smokeless tobacco. She reports that she does not drink alcohol or use illicit drugs.     Allergies  Allergen Reactions  . Ace Inhibitors Anaphylaxis and Swelling    Angioedema  . Zestril [Lisinopril] Anaphylaxis and Swelling    Angioedema   Medications Prior to Admission  Medication Sig Dispense Refill  . albuterol (PROVENTIL HFA;VENTOLIN HFA) 108 (90 BASE) MCG/ACT inhaler Inhale 2 puffs into the lungs every 4 (four) hours as needed for wheezing or shortness of breath. 18 g 1  . amLODipine (NORVASC) 10 MG tablet Take 1 tablet (10 mg total) by mouth daily. 30 tablet 0  . blood glucose meter kit and supplies KIT Dispense based on patient and insurance preference. Use up to four times daily as directed. (FOR ICD-9 250.00, 250.01). 1 each 0  . Buprenorphine HCl-Naloxone HCl (SUBOXONE) 8-2 MG FILM Place 1 Film under the tongue 3 (three) times daily.     . cyclobenzaprine (FLEXERIL) 10 MG tablet Take 10 mg by mouth at bedtime as needed for muscle spasms.    Marland Kitchen docusate sodium (COLACE) 100 MG capsule Take 100 mg by mouth daily as needed for mild constipation.    Marland Kitchen ibuprofen (ADVIL,MOTRIN) 200 MG tablet Take 400-600 mg by mouth every 6 (six) hours as needed for fever, headache or moderate pain.     Marland Kitchen insulin aspart (NOVOLOG FLEXPEN) 100 UNIT/ML FlexPen Inject 3 Units into the skin 3 (three) times daily with meals.    . Insulin Glargine (LANTUS SOLOSTAR) 100 UNIT/ML Solostar Pen Inject 34 Units into the skin daily. At 10AM (Patient taking differently: Inject 30 Units into the skin every morning. At 10AM) 15 mL 3  . Insulin Pen Needle (PEN NEEDLES 3/16") 31G X 5 MM MISC Use as directed. 100 each 3  . Multiple Vitamin (MULTIVITAMIN) tablet Take 1 tablet by mouth daily with supper.       . pregabalin (LYRICA) 75 MG capsule Take 75 mg by mouth 4 (four) times daily.      Home: Home Living Family/patient expects to be discharged to:: Private residence Living Arrangements: Spouse/significant other, Children (adult son) Available Help at Discharge: Family, Available 24 hours/day Type of Home: House Home Access: Stairs to enter Technical brewer of Steps: 1 Entrance Stairs-Rails: None Home Layout: One level (one step to get down into kitchen) Bathroom Shower/Tub: Chiropodist: Standard Bathroom Accessibility: No (w/c won't fit) Home Equipment: Environmental consultant - 2 wheels, Wheelchair - manual, Bedside commode, Shower seat, Hand held shower head, Radiation protection practitioner Equipment: Reacher  Functional History: Prior Function Level of Independence: Needs assistance Gait / Transfers Assistance Needed: was primarily w/c dependent due to NWB status on L LE and weakness in R LE  ADL's / Homemaking Assistance Needed: set up for sponge bathing and dressing leaning side to side, assist for IADL Functional Status:  Mobility: Bed Mobility Overal bed mobility: Needs Assistance Bed Mobility: Supine to Sit Supine to sit: Supervision, HOB elevated General bed mobility comments: with rail Transfers Overall transfer level: Needs assistance Equipment used: Rolling walker (2  wheeled) Transfers: Sit to/from Stand Sit to Stand: +2 physical assistance, Mod assist General transfer comment: did not achieve full standing position      ADL: ADL Overall ADL's : Needs assistance/impaired Eating/Feeding: Independent, Bed level Grooming: Sitting, Supervision/safety Upper Body Bathing: Sitting, Minimal assitance Lower Body Bathing: Sitting/lateral leans, Moderate assistance Upper Body Dressing : Supervision/safety, Sitting Lower Body Dressing: Moderate assistance, Sit to/from stand Toilet Transfer: Min guard, Requires drop arm, Cueing for sequencing, Cueing for  safety (lateral transfer) Toileting- Clothing Manipulation and Hygiene: Supervision/safety, Sitting/lateral lean  Cognition: Cognition Overall Cognitive Status: Within Functional Limits for tasks assessed Orientation Level: Oriented X4 Cognition Arousal/Alertness: Awake/alert Behavior During Therapy: WFL for tasks assessed/performed Overall Cognitive Status: Within Functional Limits for tasks assessed   Blood pressure 109/64, pulse 76, temperature 98.2 F (36.8 C), temperature source Oral, resp. rate 18, SpO2 97 %. Physical Exam  Nursing note and vitals reviewed. Constitutional: She is oriented to person, place, and time. She appears well-developed and well-nourished.  Alert sitting in bed. Morbidly obese.  HENT:  Head: Normocephalic and atraumatic.  Mouth/Throat: Oropharynx is clear and moist.  Eyes: Conjunctivae are normal. Pupils are equal, round, and reactive to light.  Neck: Normal range of motion. Neck supple.  Trach site intact  Cardiovascular: Normal rate and regular rhythm. Exam reveals no friction rub.  No murmur heard. Respiratory: Effort normal and breath sounds normal. No stridor. No respiratory distress. She has no wheezes.  Trach plugged.  GI: Soft. Bowel sounds are normal. She exhibits no distension. There is no tenderness.  Musculoskeletal: She exhibits edema (1+ edema RLE and moderate edema L-BKA.).  Contractures bilateral knees with arthritic changes noted RLE. L-BKA with compressive dressing and flexed/elevated on pillow. Varus deformities both knees. Able to extend fully with PROM.  Neurological: She is alert and oriented to person, place, and time. No cranial nerve deficit. She exhibits normal muscle tone. Coordination normal.  Able to communicate with mechanical laryngoscopy device. UE 5/5. RLE: 4/5 prox to distal. Unable to lift left leg off bed yet. Decreased LT below right knee.  Skin: Skin is warm and dry.  Psychiatric: Her mood appears anxious.  Her speech is rapid and/or pressured. She expresses inappropriate judgment. She is inattentive.     Lab Results Last 24 Hours    Results for orders placed or performed during the hospital encounter of 09/09/15 (from the past 24 hour(s))  Glucose, capillary Status: None   Collection Time: 09/13/15 11:53 AM  Result Value Ref Range   Glucose-Capillary 75 65 - 99 mg/dL   Comment 1 Notify RN    Comment 2 Document in Chart   Glucose, capillary Status: None   Collection Time: 09/13/15 3:10 PM  Result Value Ref Range   Glucose-Capillary 93 65 - 99 mg/dL  Glucose, capillary Status: Abnormal   Collection Time: 09/13/15 7:06 PM  Result Value Ref Range   Glucose-Capillary 197 (H) 65 - 99 mg/dL  Glucose, capillary Status: Abnormal   Collection Time: 09/13/15 8:17 PM  Result Value Ref Range   Glucose-Capillary 184 (H) 65 - 99 mg/dL  Basic metabolic panel Status: Abnormal   Collection Time: 09/14/15 3:33 AM  Result Value Ref Range   Sodium 140 135 - 145 mmol/L   Potassium 3.8 3.5 - 5.1 mmol/L   Chloride 106 101 - 111 mmol/L   CO2 30 22 - 32 mmol/L   Glucose, Bld 122 (H) 65 - 99 mg/dL   BUN 15 6 - 20 mg/dL   Creatinine,  Ser 1.22 (H) 0.44 - 1.00 mg/dL   Calcium 8.4 (L) 8.9 - 10.3 mg/dL   GFR calc non Af Amer 48 (L) >60 mL/min   GFR calc Af Amer 56 (L) >60 mL/min   Anion gap 4 (L) 5 - 15  Glucose, capillary Status: Abnormal   Collection Time: 09/14/15 5:46 AM  Result Value Ref Range   Glucose-Capillary 106 (H) 65 - 99 mg/dL      Imaging Results (Last 48 hours)    No results found.    Assessment/Plan: Diagnosis: s/p left BKA, OA right knee, morbidly obese 1. Does the need for close, 24 hr/day medical supervision in concert with the patient's rehab needs make it unreasonable for this patient to be served in a less intensive setting?  Yes 2. Co-Morbidities requiring supervision/potential complications: PVD, dm, htn, ckd, copd 3. Due to bladder management, bowel management, safety, skin/wound care, disease management, medication administration, pain management and patient education, does the patient require 24 hr/day rehab nursing? Yes 4. Does the patient require coordinated care of a physician, rehab nurse, PT (1-2 hrs/day, 5 days/week) and OT (1-2 hrs/day, 5 days/week) to address physical and functional deficits in the context of the above medical diagnosis(es)? Yes Addressing deficits in the following areas: balance, endurance, locomotion, strength, transferring, bowel/bladder control, bathing, dressing, feeding, grooming, toileting and psychosocial support 5. Can the patient actively participate in an intensive therapy program of at least 3 hrs of therapy per day at least 5 days per week? Yes 6. The potential for patient to make measurable gains while on inpatient rehab is excellent 7. Anticipated functional outcomes upon discharge from inpatient rehab are modified independent with PT, modified independent with OT, n/a with SLP. 8. Estimated rehab length of stay to reach the above functional goals is: 7-10 days 9. Does the patient have adequate social supports and living environment to accommodate these discharge functional goals? Yes 10. Anticipated D/C setting: Home 11. Anticipated post D/C treatments: HH therapy and Outpatient therapy 12. Overall Rehab/Functional Prognosis: excellent  RECOMMENDATIONS: This patient's condition is appropriate for continued rehabilitative care in the following setting: CIR Patient has agreed to participate in recommended program. Yes Note that insurance prior authorization may be required for reimbursement for recommended care.  Comment: Rehab Admissions Coordinator to follow up.  Thanks,  Meredith Staggers, MD, Mellody Drown     09/14/2015       Revision History     Date/Time User  Provider Type Action   09/15/2015 11:13 AM Meredith Staggers, MD Physician Sign   09/14/2015 11:25 AM Bary Leriche, PA-C Physician Assistant Share   View Details Report       Routing History     Date/Time From To Method   09/15/2015 11:13 AM Meredith Staggers, MD Kerin Perna, NP Fax

## 2015-09-15 NOTE — Progress Notes (Signed)
Pt c/o vaginal/peri area itching, pt states it feels like a yeast infection (has had before she states). Paged the [facility] service number [phone], spoke with a MD with group, will look into it and place an order.

## 2015-09-15 NOTE — Progress Notes (Signed)
Chelsea Gong, RN Rehab Admission Coordinator Signed Physical Medicine and Rehabilitation PMR Pre-admission 09/15/2015 2:58 PM  Related encounter: ED to Hosp-Admission (Discharged) from 09/09/2015 in [facility]   PMR Admission Coordinator Pre-Admission Assessment  Patient: [name] is an 60 y.o., female MRN: [id number] DOB: January 21, 1956 Height:   Weight:    Insurance Information  PRIMARY: Medicaid [place] Access Policy#: [id number] p Subscriber: pt  Medicaid Application Date: Case Manager:  Disability Application Date: Case Worker:   Emergency Contact Information Contact Information    Name Relation Home Work [place] Spouse   [phone]   [name]   [phone]     Current Medical History  Patient Admitting Diagnosis: Left BKA  History of Present Illness: [name] is a 60 y.o. 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 She elected to go Home and was admitted on 07/15 pm with fever and SIR reaction due to wound infection. She was started on broad spectrum antibiotics and MRI foot showed edema but no evidence of osteo. Wound continue to decline and she underwent L-BKA by Dr. Sharol Given on 09/13/15. Post op with acute on chronic pain with high levels of anxiety. Dilaudid discontinued and percocet scheduled to help with pain management.     Past Medical History  Past Medical History  Diagnosis Date  . Hypertension   . COPD (chronic obstructive pulmonary  disease) ([place])   . Shortness of breath   . Arthritis   . Diabetic neuropathy ([place])   . Rash, skin     face  . Kidney stone   . Sleep apnea     has not used cpap past 10 yrs  . Nocturia   . Urge incontinence   . Morbid obesity with BMI of 40.0-44.9, adult ([place])   . Renal insufficiency   . Diabetes mellitus without complication ([place])     INSULIN DEPENDENT  . Neuropathy ([place])   . Head and neck cancer ([place]) 04/19/2015  . Pneumonia   . Depression   . Anxiety   . Fibromyalgia   . Difficult intubation     total laryngectomy    Family History  family history is not on file. She was adopted.  Prior Rehab/Hospitalizations:  Has the patient had major surgery during 100 days prior to admission? Yes  Current Medications   Current facility-administered medications:  . albuterol (PROVENTIL) (2.5 MG/3ML) 0.083% nebulizer solution 2.5 mg, 2.5 mg, Nebulization, Q4H PRN, Blane Ohara McDiarmid, MD . amLODipine (NORVASC) tablet 10 mg, 10 mg, Oral, Daily, Mercy Riding, MD, 10 mg at 09/15/15 0931 . aspirin EC tablet 81 mg, 81 mg, Oral, Daily, Mercy Riding, MD, 81 mg at 09/15/15 0930 . baclofen (LIORESAL) tablet 5 mg, 5 mg, Oral, TID, Eloise Levels, MD, 5 mg at 09/15/15 0931 . buprenorphine (SUBUTEX) SL tablet 8 mg, 8 mg, Sublingual, TID, Mercy Riding, MD, 8 mg at 09/15/15 1323 . docusate sodium (COLACE) capsule 100 mg, 100 mg, Oral, BID, Lanae Crumbly, PA-C, 100 mg at 09/15/15 0931 . enoxaparin (LOVENOX) injection 40 mg, 40 mg, Subcutaneous, Q24H, Mercy Riding, MD, 40 mg at 09/14/15 2044 . fluconazole (DIFLUCAN) tablet 150 mg, 150 mg, Oral, Daily, Abigail Delman Cheadle,  MD, 150 mg at 09/15/15 1323 . ibuprofen (ADVIL,MOTRIN) tablet 600 mg, 600 mg, Oral, Q6H PRN, Blane Ohara McDiarmid, MD, 600 mg at 09/15/15 1323 . insulin aspart (novoLOG) injection 0-15 Units, 0-15 Units, Subcutaneous, TID WC, Mercy Riding, MD, 2 Units at  09/15/15 1323 . insulin aspart (novoLOG) injection 0-5 Units, 0-5 Units, Subcutaneous, QHS, Mercy Riding, MD, 0 Units at 09/09/15 2300 . insulin glargine (LANTUS) injection 17 Units, 17 Units, Subcutaneous, QHS, Mercy Riding, MD, 17 Units at 09/14/15 2214 . lactated ringers infusion, , Intravenous, Continuous, Oleta Mouse, MD . metoCLOPramide (REGLAN) tablet 5-10 mg, 5-10 mg, Oral, Q8H PRN **OR** metoCLOPramide (REGLAN) injection 5-10 mg, 5-10 mg, Intravenous, Q8H PRN, Lanae Crumbly, PA-C . nystatin (MYCOSTATIN) 100000 UNIT/ML suspension 500,000 Units, 5 mL, Oral, QID, Marybelle Killings, MD, 500,000 Units at 09/15/15 1324 . ondansetron (ZOFRAN) tablet 4 mg, 4 mg, Oral, Q6H PRN **OR** ondansetron (ZOFRAN) injection 4 mg, 4 mg, Intravenous, Q6H PRN, Lanae Crumbly, PA-C, 4 mg at 09/14/15 1317 . oxyCODONE (Oxy IR/ROXICODONE) immediate release tablet 5 mg, 5 mg, Oral, Q4H PRN, Lanae Crumbly, PA-C, 5 mg at 09/15/15 1434 . oxyCODONE-acetaminophen (PERCOCET/ROXICET) 5-325 MG per tablet 1 tablet, 1 tablet, Oral, Q6H, Vivi Barrack, MD, 1 tablet at 09/15/15 1145 . piperacillin-tazobactam (ZOSYN) IVPB 3.375 g, 3.375 g, Intravenous, Q8H, Todd D McDiarmid, MD, 3.375 g at 09/15/15 1323 . pregabalin (LYRICA) capsule 75 mg, 75 mg, Oral, QID, Mercy Riding, MD, 75 mg at 09/14/15 2045 . senna (SENOKOT) tablet 8.6 mg, 1 tablet, Oral, Daily PRN, Eloise Levels, MD, 8.6 mg at 09/15/15 1326 . vancomycin (VANCOCIN) IVPB 750 mg/150 ml premix, 750 mg, Intravenous, Q12H, Blane Ohara McDiarmid, MD, 750 mg at 09/15/15 0932  Patients Current Diet: Diet Carb Modified Fluid consistency:: Thin; Room service appropriate?: Yes  Precautions / Restrictions Precautions Precautions: Fall Other Brace/Splint: adjusted metal stay to lateral side of knee to decrease pressure and pain on end of L LE Restrictions Weight Bearing Restrictions: Yes LLE Weight Bearing: Non weight bearing   Has the patient had 2 or more falls or a  fall with injury in the past year?No  Prior Activity Level Limited Community (1-2x/wk): limited since transmet amputation  Development worker, international aid / Equipment Home Assistive Devices/Equipment: Wheelchair, Radio producer (specify quad or straight), Eyeglasses, Dentures (specify type), Walker (specify type), CBG Meter, Other (Comment), Bedside commode/3-in-1, Shower chair without back, Grab bars in shower, Hand-held shower hose Home Equipment: Environmental consultant - 2 wheels, Wheelchair - manual, Bedside commode, Shower seat, Hand held shower head, Adaptive equipment  Prior Device Use: Indicate devices/aids used by the patient prior to current illness, exacerbation or injury? Manual wheelchair  Prior Functional Level Prior Function Level of Independence: Needs assistance Gait / Transfers Assistance Needed: was primarily w/c dependent due to NWB status on L LE and weakness in R LE  ADL's / Homemaking Assistance Needed: set up for sponge bathing and dressing leaning side to side, assist for IADL Comments: unable to get w/c into bathroom door  Self Care: Did the patient need help bathing, dressing, using the toilet or eating? Needed some help  Indoor Mobility: Did the patient need assistance with walking from room to room (with or without device)? Needed some help  Stairs: Did the patient need assistance with internal or external stairs (with or without device)? Needed some help  Functional Cognition: Did the patient need help planning regular tasks such as shopping or remembering to take medications? Needed some help  Current Functional Level Cognition  Overall Cognitive Status: Within Functional Limits for tasks assessed Orientation Level: Oriented X4   Extremity Assessment (includes Sensation/Coordination)  Upper Extremity Assessment: Generalized weakness  Lower Extremity Assessment: RLE deficits/detail, LLE deficits/detail RLE Deficits / Details: unable to achieve full PROM extension - lacking  approx 5* LLE Deficits / Details: pt reports spinal not fully worn off yet, poor quad set, able to peform full PROM extension, adjusted KI for comfort, pt aware to wear at all times    ADLs  Overall ADL's : Needs assistance/impaired Eating/Feeding: Independent, Bed level Grooming: Sitting, Supervision/safety Upper Body Bathing: Sitting, Minimal assitance Lower Body Bathing: Sitting/lateral leans, Moderate assistance Upper Body Dressing : Supervision/safety, Sitting Lower Body Dressing: Moderate assistance, Sit to/from stand Toilet Transfer: Min guard, Requires drop arm, Cueing for sequencing, Cueing for safety (lateral transfer) Toileting- Clothing Manipulation and Hygiene: Supervision/safety, Sitting/lateral lean    Mobility  Overal bed mobility: Needs Assistance Bed Mobility: Supine to Sit Supine to sit: Supervision, HOB elevated General bed mobility comments: with rail    Transfers  Overall transfer level: Needs assistance Equipment used: Rolling walker (2 wheeled) Transfers: Sit to/from Stand, Lateral/Scoot Transfers Sit to Stand: +2 physical assistance, Mod assist Lateral/Scoot Transfers: Min guard, From elevated surface General transfer comment: pt held onto RW and unable to achieve full standing position due to weakness per pt, set up room for lateral/scoot transfer from bed to drop arm commode to drop arm recliner    Ambulation / Gait / Stairs / Wheelchair Mobility       Posture / Balance Balance Overall balance assessment: Needs assistance Sitting balance-Leahy Scale: Fair Standing balance-Leahy Scale: Zero    Special needs/care consideration  Skin surgical incision Bowel mgmt: reports no BM since 7/14. Has chronic constipation issues Bladder mgmt: conitnent Diabetic mgmt yes Has electro larynx since 04/2015   Previous Home Environment Living Arrangements: Spouse/significant other, Other (Comment) (son also lives with pt and her spouse) Lives  With: Spouse, Son Available Help at Discharge: Family, Available 24 hours/day Type of Home: House Home Layout: One level Home Access: Stairs to enter Entrance Stairs-Rails: None Entrance Stairs-Number of Steps: 1 Bathroom Shower/Tub: Public librarian, Architectural technologist: Standard Bathroom Accessibility: No (accessible by walker, not wheelchair) [place]: Yes Type of [place]: [place] (if known): Wellness   Discharge Living Setting Plans for Discharge Living Setting: Patient's home, Lives with (comment) (spouse and son) Type of Home at Discharge: House Discharge Home Layout: One level Discharge Home Access: Stairs to enter Entrance Stairs-Rails: None Entrance Stairs-Number of Steps: 1 step onto porch Discharge Bathroom Shower/Tub: Tub/shower unit, Curtain Discharge Bathroom Toilet: Standard Discharge Bathroom Accessibility: Yes How Accessible: Accessible via walker (not accessible with w/c) Does the patient have any problems obtaining your medications?: No  Social/Family/Support Systems Patient Roles: Spouse, Parent Contact Information: spouse Pilar Plate and son, Denyse Amass Anticipated Caregiver: spouse and son Anticipated Caregiver's Contact Information: see above Ability/Limitations of Caregiver: no limitations. spouse unemployed Building control surveyor Availability: 24/7 Discharge Plan Discussed with Primary Caregiver: Yes Is Caregiver In Agreement with Plan?: Yes Does Caregiver/Family have Issues with Lodging/Transportation while Pt is in Rehab?: No (son stays with pt 24/7 inhospital)   Goals/Additional Needs Patient/Family Goal for Rehab: Mod I with PT and OT Expected length of stay: ELOS 7-10 days Equipment Needs: Pt has electro larynx sonce 04/2015 after surgery for neck and throat cancer Special Service Needs: needs son's assist at time with communication Pt/Family Agrees to Admission and  willing to participate: Yes Program Orientation Provided &  Reviewed with Pt/Caregiver Including Roles & Responsibilities: Yes   Decrease burden of Care through IP rehab admission: n/a  Possible need for SNF placement upon discharge: not anticipated  Patient Condition: This patient's condition remains as documented in the consult dated 09/15/2015, in which the Rehabilitation Physician determined and documented that the patient's condition is appropriate for intensive rehabilitative care in an inpatient rehabilitation facility. Will admit to inpatient rehab today.  Preadmission Screen Completed By: Cleatrice Burke, 09/15/2015 3:03 PM ______________________________________________________________________  Discussed status with Dr. Naaman Plummer on 09/15/2015 at 1502 and received telephone approval for admission today.  Admission Coordinator: Cleatrice Burke, time 7939 Date 09/15/2015          Cosigned by: Meredith Staggers, MD at 09/15/2015 3:26 PM  Revision History     Date/Time User Provider Type Action   09/15/2015 3:26 PM Meredith Staggers, MD Physician Cosign   09/15/2015 3:04 PM Chelsea Gong, RN Rehab Admission Coordinator Sign

## 2015-09-15 NOTE — Progress Notes (Signed)
Pharmacy Antibiotic Note  [name] is a 60 y.o. female admitted on 09/09/2015 with diabetic foot infection s/p BKA on 7/19. Pharmacy has been consulted for vancomycin and Zosyn dosing. Plan to stop abx tonight per Dr. Lorin Mercy.  Plan -Vancomycin 750 mg IV q12h x 1 more dose -Zosyn 3.375 g IV q8h x 2 more doses -Pharmacy will sign off on consult     Temp (24hrs), Avg:99.1 F (37.3 C), Min:98.8 F (37.1 C), Max:99.7 F (37.6 C)   Recent Labs Lab 09/08/15 1704 09/08/15 1759 09/09/15 1325 09/09/15 1347 09/09/15 1601 09/10/15 0332 09/11/15 0303 09/12/15 1305 09/13/15 0406 09/14/15 0333 09/15/15 0356  WBC 8.3  --  9.3  --   --  6.9 7.9  --   --   --   --   CREATININE 1.32*  --  1.15*  --   --  1.16*  --   --  1.32* 1.22* 1.26*  LATICACIDVEN  --  1.77  --  1.91* 1.80  --   --   --   --   --   --   VANCOTROUGH  --   --   --   --   --   --   --  27*  --   --   --     CrCl: ~75-80 mL/min    Allergies  Allergen Reactions  . Ace Inhibitors Anaphylaxis and Swelling    Angioedema  . Zestril [Lisinopril] Anaphylaxis and Swelling    Angioedema    Antimicrobials this admission: Vancomycin 7/15>>  Zosyn 7/15>>   Microbiology 7/15 UCx: mutliple species 7/15 BCx: NGF 7/18 MRSA PCR: neg   Thank you for allowing pharmacy to be a part of this patient's care.  Gwenlyn Perking, PharmD PGY1 Pharmacy Resident Pager: [phone] 09/15/2015 9:46 AM

## 2015-09-16 ENCOUNTER — Inpatient Hospital Stay (HOSPITAL_COMMUNITY): Payer: Medicaid Other | Admitting: Occupational Therapy

## 2015-09-16 ENCOUNTER — Inpatient Hospital Stay (HOSPITAL_COMMUNITY): Payer: Medicaid Other | Admitting: Physical Therapy

## 2015-09-16 DIAGNOSIS — S88112S Complete traumatic amputation at level between knee and ankle, left lower leg, sequela: Secondary | ICD-10-CM

## 2015-09-16 LAB — COMPREHENSIVE METABOLIC PANEL
ALT: 14 U/L (ref 14–54)
AST: 20 U/L (ref 15–41)
Albumin: 3.3 g/dL — ABNORMAL LOW (ref 3.5–5.0)
Alkaline Phosphatase: 84 U/L (ref 38–126)
Anion gap: 7 (ref 5–15)
BILIRUBIN TOTAL: 0.6 mg/dL (ref 0.3–1.2)
BUN: 15 mg/dL (ref 6–20)
CO2: 30 mmol/L (ref 22–32)
Calcium: 9.1 mg/dL (ref 8.9–10.3)
Chloride: 104 mmol/L (ref 101–111)
Creatinine, Ser: 1.15 mg/dL — ABNORMAL HIGH (ref 0.44–1.00)
GFR, EST NON AFRICAN AMERICAN: 52 mL/min — AB (ref >60)
Glucose, Bld: 98 mg/dL (ref 65–99)
POTASSIUM: 4.1 mmol/L (ref 3.5–5.1)
Sodium: 141 mmol/L (ref 135–145)
TOTAL PROTEIN: 7.2 g/dL (ref 6.5–8.1)

## 2015-09-16 LAB — CBC WITH DIFFERENTIAL/PLATELET
BASOS ABS: 0 10*3/uL (ref 0.0–0.1)
Basophils Relative: 0 %
EOS PCT: 7 %
Eosinophils Absolute: 0.5 10*3/uL (ref 0.0–0.7)
HEMATOCRIT: 40.6 % (ref 36.0–46.0)
Hemoglobin: 12.6 g/dL (ref 12.0–15.0)
LYMPHS ABS: 2.2 10*3/uL (ref 0.7–4.0)
LYMPHS PCT: 32 %
MCH: 26.6 pg (ref 26.0–34.0)
MCHC: 31 g/dL (ref 30.0–36.0)
MCV: 85.8 fL (ref 78.0–100.0)
MONO ABS: 0.3 10*3/uL (ref 0.1–1.0)
Monocytes Relative: 5 %
NEUTROS ABS: 3.8 10*3/uL (ref 1.7–7.7)
Neutrophils Relative %: 56 %
PLATELETS: 253 10*3/uL (ref 150–400)
RBC: 4.73 MIL/uL (ref 3.87–5.11)
RDW: 13.8 % (ref 11.5–15.5)
WBC: 6.9 10*3/uL (ref 4.0–10.5)

## 2015-09-16 LAB — GLUCOSE, CAPILLARY
GLUCOSE-CAPILLARY: 86 mg/dL (ref 65–99)
GLUCOSE-CAPILLARY: 97 mg/dL (ref 65–99)
Glucose-Capillary: 91 mg/dL (ref 65–99)
Glucose-Capillary: 96 mg/dL (ref 65–99)

## 2015-09-16 MED ORDER — ENOXAPARIN SODIUM 40 MG/0.4ML ~~LOC~~ SOLN
40.0000 mg | SUBCUTANEOUS | Status: DC
  Administered 2015-09-16 – 2015-09-25 (×10): 40 mg via SUBCUTANEOUS
  Filled 2015-09-16 (×11): qty 0.4

## 2015-09-16 NOTE — Evaluation (Signed)
Occupational Therapy Assessment and Plan  Patient Details  Name: [name] MRN: [id number] Date of Birth: 1955-05-22  OT Diagnosis: abnormal posture, acute pain, muscle weakness (generalized) and pain in joint Rehab Potential: Rehab Potential (ACUTE ONLY): Good ELOS: 10-12 days   Today's Date: 09/16/2015 OT Individual Time: 0800-0900 OT Individual Time Calculation (min): 60 min     Problem List:  Patient Active Problem List   Diagnosis Date Noted  . Unilateral complete BKA ([place]) 09/15/2015  . Status post below knee amputation of left lower extremity ([place])   . Simple chronic bronchitis ([place])   . Primary osteoarthritis of right knee   . Hx of BKA   . Infection   . Sepsis ([place]) 09/09/2015  . Wound infection ([place]) 09/09/2015  . 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 Type 2 diabetes mellitus with circulatory disorder ([place])   . Osteomyelitis of foot, acute, left 08/25/2015  . Diabetic foot infection ([place]) 04/19/2015  . Diabetic foot ulcer ([place]) 04/19/2015  . CKD (chronic kidney disease) stage 3, GFR 30-59 ml/min 04/19/2015  . Head and neck cancer ([place]) 04/19/2015  . Right hip pain 04/19/2015  . Infection of supraglottis (throat) 09/16/2014  . Incidental lung nodule, less than or equal to 21m 09/16/2014  . Obstructive uropathy 03/22/2014  . COPD (chronic obstructive pulmonary disease) ([facility] 03/20/2014  . Controlled type 2 diabetes mellitus with diabetic nephropathy ([facility] 03/20/2014  . CAP (community acquired pneumonia)   . Lice infested hair [id number]/6606 . Tobacco abuse 08/05/2012  . TINEA CORPORIS 09/01/2009  . DEPRESSION 08/11/2009  . COPD 08/11/2009  . Chronic pain syndrome 04/15/2007  . HYPERLIPIDEMIA NEC/NOS 11/02/2006  . PERIPHERAL VASCULAR DISEASE 11/02/2006  . MORBID OBESITY 10/28/2006  . Obstructive sleep apnea 10/28/2006  . POLYNEUROPATHY 10/28/2006  . Essential hypertension 10/28/2006  . OSTEOARTHRITIS, KNEES, BILATERAL 10/28/2006  .  PROTEINURIA 11/25/2005    Past Medical History:  Past Medical History  Diagnosis Date  . Hypertension   . COPD (chronic obstructive pulmonary disease) ([facility]   . Shortness of breath   . Arthritis   . Diabetic neuropathy ([facility]   . Rash, skin     face  . Kidney stone   . Sleep apnea     has not used cpap past 10 yrs  . Nocturia   . Urge incontinence   . Morbid obesity with BMI of 40.0-44.9, adult ([facility]   . Renal insufficiency   . Diabetes mellitus without complication ([facility]     INSULIN DEPENDENT  . Neuropathy ([facility]   . Head and neck cancer ([facility] 04/19/2015  . Pneumonia   . Depression   . Anxiety   . Fibromyalgia   . Difficult intubation     total laryngectomy   Past Surgical History:  Past Surgical History  Procedure Laterality Date  . Cholecystectomy    . Tubal ligation    . Carpel tunnel    . Cystoscopy w/ ureteral stent placement Left 03/22/2014    Procedure: CYSTOSCOPY WITH RETROGRADE PYELOGRAM/URETERAL STENT PLACEMENT;  Surgeon: TAlexis Frock MD;  [facility]  Service: Urology;  Laterality: Left;  . Salivary stone removal    . Cystoscopy with retrograde pyelogram, ureteroscopy and stent placement Left 05/11/2014    Procedure: [facility] URETEROSCOPY AND STENT PLACEMENT;  Surgeon: TAlexis Frock MD;  Location: [facility];  Service: Urology;  Laterality: Left;  . Holmium laser application Left 33/02/6008   Procedure: HOLMIUM LASER APPLICATION;  Surgeon:  Alexis Frock, MD;  Location: [facility];  Service: Urology;  Laterality: Left;  . Brain surgery      vertebral aneurysm  . Tonsillectomy    . Laryngectomy      total ( stoma)  . Amputation Left 08/25/2015    Procedure: 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;  Surgeon: Newt Minion, MD;  Location: [facility];  Service: Orthopedics;  Laterality: Left;  . Breast lumpectomy  1982    benign  . Leg amputation below knee Left 09/13/2015  . Amputation Left 09/13/2015    Procedure: AMPUTATION BELOW KNEE;   Surgeon: Marybelle Killings, MD;  Location: [facility];  Service: Orthopedics;  Laterality: Left;    Assessment & Plan Clinical Impression: Patient is a 60 y.o. year old female with recent admission to the hospital on 07/15 pm with fever and SIR reaction due to wound infection. She was started on broad spectrum antibiotics and MRI foot showed edema but no evidence of osteo. Wound continue to decline and she underwent L-BKA by Dr. Sharol Given on 09/13/15.  Patient transferred to CIR on 09/15/2015 .    Patient currently requires mod with basic self-care skills secondary to muscle weakness and decreased sitting balance, decreased postural control, hemiplegia and decreased balance strategies.  Prior to hospitalization, patient could complete ADLs with modified independent .  Patient will benefit from skilled intervention to decrease level of assist with basic self-care skills, increase independence with basic self-care skills and increase level of independence with iADL prior to discharge home with care partner.  Anticipate patient will require 24 hour supervision and follow up home health.  OT - End of Session Activity Tolerance: Tolerates 30+ min activity with multiple rests Endurance Deficit: Yes OT Assessment Rehab Potential (ACUTE ONLY): Good OT Patient demonstrates impairments in the following area(s): Balance;Motor;Endurance;Pain OT Basic ADL's Functional Problem(s): Bathing;Grooming OT Advanced ADL's Functional Problem(s): Simple Meal Preparation OT Transfers Functional Problem(s): Toilet;Tub/Shower OT Additional Impairment(s): None OT Plan OT Intensity: Minimum of 1-2 x/day, 45 to 90 minutes OT Frequency: 5 out of 7 days OT Duration/Estimated Length of Stay: 10-12 days OT Treatment/Interventions: Medical illustrator training;Community reintegration;Discharge planning;DME/adaptive equipment instruction;Neuromuscular re-education;Self Care/advanced ADL retraining;UE/LE Strength taining/ROM;Patient/family  education;Therapeutic Activities;Functional mobility training;Therapeutic Exercise;Wheelchair propulsion/positioning OT Self Feeding Anticipated Outcome(s): independent OT Basic Self-Care Anticipated Outcome(s): supervision OT Toileting Anticipated Outcome(s): supervision OT Bathroom Transfers Anticipated Outcome(s): supervision OT Recommendation Patient destination: Home Follow Up Recommendations: Home health OT Equipment Recommended: Tub/shower bench   Skilled Therapeutic Intervention Began self care re-training sitting on EOB.  Pt able to complete lateral leans for washing peri area with min assist.  Mod assist needed for assisting with pulling up pants.  Therapist provided total assist for doffing and donning KI over the LLE.  Increased pain noted with the LLE as well rated at a 6/10.  Did not attempt sit to stand as pt also reported painful arthritic knee in the LLE.  She stated that nursing had already given her pain meds earlier.  Pt's son also present for part of session as well and assisted with answering questions regarding PLOF and home setup.  Pt left in bed at end of session with her son present.    OT Evaluation Precautions/Restrictions  Precautions Precautions: Fall Required Braces or Orthoses: Knee Immobilizer - Left Knee Immobilizer - Left: On at all times Other Brace/Splint: adjusted metal stay to lateral side of knee to decrease pressure and pain on end of L LE Restrictions Weight Bearing Restrictions: Yes LLE Weight Bearing: Non weight  bearing  Pain Pain Assessment Pain Assessment: 0-10 Pain Score: 4  Pain Type: Surgical pain Pain Location: Leg Pain Orientation: Left Pain Descriptors / Indicators: Aching Pain Frequency: Intermittent Pain Onset: On-going Pain Intervention(s): Medication (See eMAR) Home Living/Prior Functioning Home Living Available Help at Discharge: Family, Available 24 hours/day Type of Home: House Home Access: Stairs to enter Entrance  Stairs-Rails: None Home Layout: One level Bathroom Shower/Tub: Tub/shower unit, Architectural technologist: Programmer, systems: No (accessible by walker, not wheelchair)  Lives With: Spouse, Son IADL History Occupation: On disability Prior Function Level of Independence: Independent with homemaking with wheelchair  Able to Take Stairs?: No Driving: No Vocation: On disability Comments: unable to get w/c into bathroom door ADL  See Function Section of chart  Vision/Perception  Vision- History Baseline Vision/History: Wears glasses Wears Glasses: Reading only Patient Visual Report: No change from baseline Vision- Assessment Vision Assessment?: No apparent visual deficits  Cognition Overall Cognitive Status: Within Functional Limits for tasks assessed Arousal/Alertness: Awake/alert Orientation Level: Person;Place;Situation Person: Oriented Place: Oriented Situation: Oriented Year: 2017 Month: July Day of Week: Correct Memory: Appears intact Immediate Memory Recall: Sock;Blue;Bed Memory Recall: Sock;Blue;Bed Memory Recall Sock: Without Cue Attention: Sustained;Selective Sustained Attention: Appears intact Selective Attention: Appears intact Awareness: Appears intact Problem Solving: Impaired Problem Solving Impairment: Functional complex;Functional basic Safety/Judgment: Appears intact Sensation Sensation Light Touch: Impaired Detail Light Touch Impaired Details: Impaired RLE;Impaired LLE;Impaired RUE;Impaired LUE Stereognosis: Not tested Hot/Cold: Not tested Additional Comments: Pt reports history of peripheral neuropathy in BUEs. Coordination Gross Motor Movements are Fluid and Coordinated: Yes Fine Motor Movements are Fluid and Coordinated: Yes Motor  Motor Motor: Within Functional Limits Mobility  Bed Mobility Supine to Sit: 4: Min assist Supine to Sit Details: Verbal cues for technique;Verbal cues for precautions/safety  Trunk/Postural  Assessment  Cervical Assessment Cervical Assessment: Within Functional Limits Thoracic Assessment Thoracic Assessment: Exceptions to [facility] Lumbar Assessment Lumbar Assessment: Exceptions to [facility] Postural Control Postural Control: Within Functional Limits  Balance Static Sitting Balance Static Sitting - Level of Assistance: 5: Stand by assistance Dynamic Sitting Balance Dynamic Sitting - Level of Assistance: 5: Stand by assistance Extremity/Trunk Assessment RUE Assessment RUE Assessment: Within Functional Limits LUE Assessment LUE Assessment: Within Functional Limits   See Function Navigator for Current Functional Status.   Refer to Care Plan for Long Term Goals  Recommendations for other services: None  Discharge Criteria: Patient will be discharged from OT if patient refuses treatment 3 consecutive times without medical reason, if treatment goals not met, if there is a change in medical status, if patient makes no progress towards goals or if patient is discharged from hospital.  The above assessment, treatment plan, treatment alternatives and goals were discussed and mutually agreed upon: by patient and by family  Daveon Arpino OTR/L 09/16/2015, 4:59 PM

## 2015-09-16 NOTE — Evaluation (Signed)
Physical Therapy Assessment and Plan  Patient Details  Name: [name] MRN: [id number] Date of Birth: 03-05-1955  PT Diagnosis: Difficulty walking, Impaired sensation, Muscle weakness and Osteoarthritis Rehab Potential: Fair ELOS: 10-14 days.    Today's Date: 09/16/2015 PT Individual Time: 1014-1201 AND 1632-1700 Calculated PT individual treatment time: 76 min and 28 min       Problem List:  Patient Active Problem List   Diagnosis Date Noted  . Unilateral complete BKA ([place]) 09/15/2015  . Status post below knee amputation of left lower extremity ([place])   . Simple chronic bronchitis ([place])   . Primary osteoarthritis of right knee   . Hx of BKA   . Infection   . Sepsis ([place]) 09/09/2015  . Wound infection ([place]) 09/09/2015  . 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 Type 2 diabetes mellitus with circulatory disorder ([place])   . Osteomyelitis of foot, acute, left 08/25/2015  . Diabetic foot infection ([place]) 04/19/2015  . Diabetic foot ulcer ([place]) 04/19/2015  . CKD (chronic kidney disease) stage 3, GFR 30-59 ml/min 04/19/2015  . Head and neck cancer ([place]) 04/19/2015  . Right hip pain 04/19/2015  . Infection of supraglottis (throat) 09/16/2014  . Incidental lung nodule, less than or equal to 65m 09/16/2014  . Obstructive uropathy 03/22/2014  . COPD (chronic obstructive pulmonary disease) ([place] 03/20/2014  . Controlled type 2 diabetes mellitus with diabetic nephropathy ([place] 03/20/2014  . CAP (community acquired pneumonia)   . Lice infested hair [id number]/7681 . Tobacco abuse 08/05/2012  . TINEA CORPORIS 09/01/2009  . DEPRESSION 08/11/2009  . COPD 08/11/2009  . Chronic pain syndrome 04/15/2007  . HYPERLIPIDEMIA NEC/NOS 11/02/2006  . PERIPHERAL VASCULAR DISEASE 11/02/2006  . MORBID OBESITY 10/28/2006  . Obstructive sleep apnea 10/28/2006  . POLYNEUROPATHY 10/28/2006  . Essential hypertension 10/28/2006  . OSTEOARTHRITIS, KNEES, BILATERAL 10/28/2006  .  PROTEINURIA 11/25/2005    Past Medical History:  Past Medical History  Diagnosis Date  . Hypertension   . COPD (chronic obstructive pulmonary disease) ([place]   . Shortness of breath   . Arthritis   . Diabetic neuropathy ([place]   . Rash, skin     face  . Kidney stone   . Sleep apnea     has not used cpap past 10 yrs  . Nocturia   . Urge incontinence   . Morbid obesity with BMI of 40.0-44.9, adult ([place]   . Renal insufficiency   . Diabetes mellitus without complication ([place]     INSULIN DEPENDENT  . Neuropathy ([place]   . Head and neck cancer ([place] 04/19/2015  . Pneumonia   . Depression   . Anxiety   . Fibromyalgia   . Difficult intubation     total laryngectomy   Past Surgical History:  Past Surgical History  Procedure Laterality Date  . Cholecystectomy    . Tubal ligation    . Carpel tunnel    . Cystoscopy w/ ureteral stent placement Left 03/22/2014    Procedure: CYSTOSCOPY WITH RETROGRADE PYELOGRAM/URETERAL STENT PLACEMENT;  Surgeon: TAlexis Frock MD;  [facility]  Service: Urology;  Laterality: Left;  . Salivary stone removal    . Cystoscopy with retrograde pyelogram, ureteroscopy and stent placement Left 05/11/2014    Procedure: [place] URETEROSCOPY AND STENT PLACEMENT;  Surgeon: TAlexis Frock MD;  Location: [facility];  Service: Urology;  Laterality: Left;  . Holmium laser application Left 31/57/2620   Procedure: HOLMIUM LASER APPLICATION;  Surgeon: Alexis Frock, MD;  Location: [facility];  Service: Urology;  Laterality: Left;  . Brain surgery      vertebral aneurysm  . Tonsillectomy    . Laryngectomy      total ( stoma)  . Amputation Left 08/25/2015    Procedure: 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;  Surgeon: Newt Minion, MD;  Location: [facility];  Service: Orthopedics;  Laterality: Left;  . Breast lumpectomy  1982    benign  . Leg amputation below knee Left 09/13/2015  . Amputation Left 09/13/2015    Procedure: AMPUTATION BELOW KNEE;   Surgeon: Marybelle Killings, MD;  Location: [facility];  Service: Orthopedics;  Laterality: Left;    Assessment & Plan Clinical Impression: Patient is a 60 y.o. 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 She elected to go Home and was admitted on 07/15 pm with fever and SIR reaction due to wound infection. She was started on broad spectrum antibiotics and MRI foot showed edema but no evidence of osteo. Wound continue to decline and she underwent L-BKA by Dr. Sharol Given on 09/13/15. Post op with acute on chronic pain with high levels of anxiety.   Patient transferred to CIR on 09/15/2015 .   Patient currently requires mod with mobility secondary to muscle weakness and muscle joint tightness, decreased cardiorespiratoy endurance and decreased sitting balance, decreased standing balance and difficulty maintaining precautions.  Prior to hospitalization, patient was modified independent  with mobility and lived with Spouse, Son in a House home.  Home access is  Stairs to enter.  Patient will benefit from skilled PT intervention to maximize safe functional mobility, minimize fall risk and decrease caregiver burden for planned discharge home with intermittent assist.  Anticipate patient will benefit from follow up [facility] at discharge.  PT - End of Session Activity Tolerance: Tolerates 30+ min activity with multiple rests Endurance Deficit: Yes PT Assessment Rehab Potential (ACUTE/IP ONLY): Fair Barriers to Discharge: Inaccessible home environment PT Patient demonstrates impairments in the following area(s): Endurance;Motor;Pain;Sensory PT Transfers Functional Problem(s): Bed to Chair;Bed Mobility;Car;Furniture;Floor PT Locomotion Functional Problem(s): Ambulation;Wheelchair  Mobility;Stairs PT Plan PT Intensity: Minimum of 1-2 x/day ,45 to 90 minutes PT Frequency: 5 out of 7 days PT Duration Estimated Length of Stay: 10-14 days.  PT Treatment/Interventions: Ambulation/gait training;Balance/vestibular training;Cognitive remediation/compensation;Community reintegration;Discharge planning;Disease management/prevention;DME/adaptive equipment instruction;Functional mobility training;Neuromuscular re-education;Pain management;Patient/family education;Psychosocial support;Skin care/wound management;Splinting/orthotics;Stair training;Therapeutic Activities;Therapeutic Exercise;UE/LE Strength taining/ROM;UE/LE Coordination activities;Visual/perceptual remediation/compensation;Wheelchair propulsion/positioning PT Transfers Anticipated Outcome(s): Mod I with LRAD.  PT Locomotion Anticipated Outcome(s): Mod I at [facility] level.  PT Recommendation Follow Up Recommendations: Home health PT Patient destination: Home Equipment Recommended: To be determined    Skilled Therapeutic Intervention Session 1:  Patient received supine in bed and agreeable to PT. PT performed evaluation and initiated treatment intervention; see below for results.  Patient requested to use restroom for urination. Patient performed SB transfer to bariatric drop arm BSC with mod A from PT and max cues for proper UE placement and proper weight shifting technique. Patient also performed additional SB transfers as listed below with max cues for proper UE placement and improved head/hips relationship.   PT educated patient on car transfer to small SUV height with SB and Mod A, as well as max cues for proper sequencing and technique to prevent WB  through R LE.   Patient left in [facility] with call bell in reach at end of treatment session.   Session 2:  Patient received sitting in bed and agreeable to PT.  Patient performed long sitting to sitting EOB with supervision A from PT. SB transfer with mod A to new WC with  increased width and Elevating Amputee support pad. PT continued to require to provide mod-max cues for improved head/hips relationship.   WC mobility training instructed by PT with supervision A with mod cues for improved posture and use of BUE to reduce turning radius in tight spaces.   Patient returned to bed with SB transfer with mod A and mod cues for UE placement. Sit>supine with min A from PT with min cues to prevent WB through R LE.   Patient left in bed with bed alarm set and call bell in reach.      PT Evaluation Precautions/Restrictions Precautions Precautions: Fall Required Braces or Orthoses: Knee Immobilizer - Left Knee Immobilizer - Left: On at all times Other Brace/Splint: adjusted metal stay to lateral side of knee to decrease pressure and pain on end of L LE Restrictions Weight Bearing Restrictions: Yes LLE Weight Bearing: Non weight bearing General Chart Reviewed: Yes Additional Pertinent History: COPD, DM, OA Family/Caregiver Present: Yes Vital SignsTherapy Vitals Pulse Rate: 83 Resp: 20 BP: (!) 147/80 mmHg Patient Position (if appropriate): Lying Oxygen Therapy SpO2: 95 % O2 Device: Not Delivered Pain Pain Assessment Pain Assessment: 0-10 Pain Score: 6  Pain Type: Surgical pain Pain Location: Leg Pain Orientation: Left;Distal Pain Descriptors / Indicators: Aching Pain Frequency: Intermittent Pain Onset: On-going Patients Stated Pain Goal: 3 Pain Intervention(s): Medication (See eMAR) Home Living/Prior Functioning Home Living Available Help at Discharge: Family;Available 24 hours/day Type of Home: House Home Access: Stairs to enter Home Layout: One level Bathroom Shower/Tub: Tub/shower unit;Curtain Bathroom Accessibility: No (accessible by walker, not wheelchair)  Lives With: Spouse;Son Prior Function Level of Independence: Independent with homemaking with wheelchair  Able to Take Stairs?: No Driving: No Vocation: On disability Comments:  unable to get w/c into bathroom door Vision/Perception     Cognition Orientation Level: Oriented X4 Memory: Appears intact Awareness: Appears intact Problem Solving: Impaired Safety/Judgment: Appears intact Sensation Sensation Light Touch: Impaired Detail Light Touch Impaired Details: Impaired RLE;Impaired LLE Proprioception: Appears Intact Coordination Gross Motor Movements are Fluid and Coordinated: No Coordination and Movement Description: Decreased coordination of movement of R LE due to OA pain and lack of motion.  Motor  Motor Motor: Within Functional Limits Motor - Skilled Clinical Observations: Generalized weaknes..   Mobility Bed Mobility Bed Mobility: Rolling Right;Rolling Left;Supine to Sit;Sit to Supine Rolling Right: 4: Min assist Rolling Right Details: Verbal cues for technique;Verbal cues for precautions/safety Rolling Left: 4: Min assist Rolling Left Details: Verbal cues for technique;Verbal cues for precautions/safety Supine to Sit: 4: Min assist Supine to Sit Details: Verbal cues for technique;Verbal cues for precautions/safety Sit to Supine: 4: Min assist Sit to Supine - Details: Verbal cues for technique;Verbal cues for precautions/safety Transfers Transfers: Yes Sit to Stand: 2: Max assist Sit to Stand Details: Verbal cues for safe use of DME/AE;Manual facilitation for placement Lateral/Scoot Transfers: 3: Mod assist (SB. ) Lateral/Scoot Transfer Details: Verbal cues for safe use of DME/AE;Manual facilitation for weight shifting;Verbal cues for precautions/safety Locomotion  Ambulation Ambulation: No Gait Gait: No Stairs / Additional Locomotion Stairs: No Wheelchair Mobility Wheelchair Mobility: Yes Wheelchair Assistance: 5: Supervision Wheelchair Assistance Details: Verbal cues for technique;Verbal cues for precautions/safety  Wheelchair Propulsion: Both upper extremities Wheelchair Parts Management: Needs assistance Distance: 169f   Trunk/Postural Assessment  Cervical Assessment Cervical Assessment: Within Functional Limits Thoracic Assessment Thoracic Assessment: Exceptions to [facility](increased trunk flexion) Lumbar Assessment Lumbar Assessment: Ex[facility] to [facility](Posterior pelvic tilt) Postural Control Postural Control: Within Functional Limits  Balance Balance Balance Assessed: Yes Static Sitting Balance Static Sitting - Level of Assistance: 6: Modified independent (Device/Increase time) Dynamic Sitting Balance Dynamic Sitting - Level of Assistance: 5: Stand by assistance Static Standing Balance Static Standing - Balance Support: Bilateral upper extremity supported Static Standing - Level of Assistance: 2: Max assist Extremity Assessment      RLE Assessment RLE Assessment: Exceptions to [facility] AROM (degrees) RLE Overall AROM Comments: lacking ~5 degrees full extension. max knee flexion ~95degrees.  RLE Strength RLE Overall Strength Comments: 4+/5 throughout with MMT.  LLE Assessment LLE Assessment: Exceptions to WFL LLE AROM (degrees) LLE Overall AROM Comments: lacking ~5 degrees full extension. max knee flexion~95degrees. LLE Strength LLE Overall Strength Comments: 4+/5 in hip. unable to test knee due to pain in residual limb   See Function Navigator for Current Functional Status.   Refer to Care Plan for Long Term Goals  Recommendations for other services: None  Discharge Criteria: Patient will be discharged from PT if patient refuses treatment 3 consecutive times without medical reason, if treatment goals not met, if there is a change in medical status, if patient makes no progress towards goals or if patient is discharged from hospital.  The above assessment, treatment plan, treatment alternatives and goals were discussed and mutually agreed upon: by patient and by family  ALorie Phenix7/22/2017, 12:23 PM

## 2015-09-16 NOTE — Progress Notes (Signed)
60 y.o. 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 She elected to go Home and was admitted on 07/15 pm with fever and SIR reaction due to wound infection. She was started on broad spectrum antibiotics and MRI foot showed edema but no evidence of osteo. Wound continue to decline and she underwent L-BKA by Dr. Sharol Given on 09/13/15.  Subjective/Complaints:   Objective: Vital Signs: Blood pressure 160/82, pulse 78, temperature 98.1 F (36.7 C), temperature source Oral, resp. rate 18, SpO2 94 %. No results found. Results for orders placed or performed during the hospital encounter of 09/15/15 (from the past 72 hour(s))  CBC     Status: Abnormal   Collection Time: 09/15/15  8:27 PM  Result Value Ref Range   WBC 7.3 4.0 - 10.5 K/uL   RBC 4.31 3.87 - 5.11 MIL/uL   Hemoglobin 11.5 (L) 12.0 - 15.0 g/dL   HCT 37.1 36.0 - 46.0 %   MCV 86.1 78.0 - 100.0 fL   MCH 26.7 26.0 - 34.0 pg   MCHC 31.0 30.0 - 36.0 g/dL   RDW 13.8 11.5 - 15.5 %   Platelets 268 150 - 400 K/uL  Glucose, capillary     Status: None   Collection Time: 09/15/15  9:53 PM  Result Value Ref Range   Glucose-Capillary 94 65 - 99 mg/dL  CBC WITH DIFFERENTIAL     Status: None   Collection Time: 09/16/15  5:46 AM  Result Value Ref Range   WBC 6.9 4.0 - 10.5 K/uL   RBC 4.73 3.87 - 5.11 MIL/uL   Hemoglobin 12.6 12.0 - 15.0 g/dL   HCT 40.6 36.0 - 46.0 %   MCV 85.8 78.0 - 100.0 fL   MCH 26.6 26.0 - 34.0 pg   MCHC 31.0 30.0 - 36.0 g/dL   RDW 13.8 11.5 - 15.5 %   Platelets 253 150 - 400 K/uL   Neutrophils Relative % 56 %   Neutro Abs 3.8 1.7 - 7.7 K/uL   Lymphocytes Relative 32 %   Lymphs Abs 2.2 0.7 - 4.0 K/uL   Monocytes Relative 5 %   Monocytes Absolute 0.3 0.1 - 1.0 K/uL   Eosinophils Relative 7 %   Eosinophils Absolute 0.5 0.0 - 0.7 K/uL   Basophils Relative 0 %   Basophils Absolute 0.0 0.0 - 0.1 K/uL  Comprehensive metabolic panel     Status: Abnormal   Collection Time: 09/16/15  5:46 AM  Result Value Ref Range   Sodium 141 135 - 145 mmol/L   Potassium 4.1 3.5 - 5.1 mmol/L   Chloride 104 101 - 111 mmol/L   CO2 30 22 - 32 mmol/L   Glucose, Bld 98 65 - 99 mg/dL   BUN 15 6 - 20 mg/dL   Creatinine, Ser 1.15 (H) 0.44 - 1.00 mg/dL   Calcium 9.1 8.9 - 10.3 mg/dL   Total Protein 7.2 6.5 - 8.1 g/dL   Albumin 3.3 (L) 3.5 - 5.0 g/dL   AST 20 15 - 41 U/L   ALT 14 14 - 54 U/L   Alkaline Phosphatase 84 38 - 126 U/L   Total Bilirubin 0.6 0.3 - 1.2 mg/dL   GFR calc non Af Amer 52 (L) >60 mL/min   GFR calc Af Amer >60 >60 mL/min  Comment: (NOTE) The eGFR has been calculated using the CKD EPI equation. This calculation has not been validated in all clinical situations. eGFR's persistently <60 mL/min signify possible Chronic Kidney Disease.    Anion gap 7 5 - 15  Glucose, capillary     Status: None   Collection Time: 09/16/15  6:37 AM  Result Value Ref Range   Glucose-Capillary 86 65 - 99 mg/dL     HEENT: Tracheostomy with cap Cardio: RRR and No murmur Resp: CTA B/L and On labored GI: CTA B/L and Unlabored Extremity:  Edema Left stump Skin:   Other [place] wrapping left stump Neuro: Alert/Oriented and Other Uses assistive device for communication, status post laryngectomy Musc/Skel:  Other No pain with upper limb range of motion. No pain with right lower limb range of motion.Has pain with flexion-extension of the left knee. General stress   Assessment/Plan: 1. Functional deficits secondary to Left below-knee amputation which require 3+ hours per day of interdisciplinary therapy in a comprehensive inpatient rehab setting. Physiatrist is providing close team supervision and 24 hour management of active medical problems listed below. Physiatrist and rehab  team continue to assess barriers to discharge/monitor patient progress toward functional and medical goals. FIM:             Medical Problem List and Plan: 1.  Functional and mobility deficits secondary to left BKA, initiate PT, OT today 2.  DVT Prophylaxis/Anticoagulation: Pharmaceutical: Lovenox, 40 mg per day 3. Chronic pain/Pain Management: Was on Suboxone tid with lyrica qid (has been refusing).  Post op on percocet, oxycodone and IV dilaudid to help with pain and baclofen tid to help manage spams.  Will change to po dilaudid prn to simplify regimen. Continue suboxone 4. Mood: Team to provide ego support. LCSW to follow for evaluation and support.   5. Neuropsych: This patient is capable of making decisions on her own behalf. 6. Skin/Wound Care: routine pressure relief measures.  Continue compressive dressing L-BKA for 7 days per protocol 7. Fluids/Electrolytes/Nutrition: Monitor I/O. Check lytes in am.   8. DM type 2 with insensate neuropathy: Monitor BS ac/hs. Continue lantus insulin at bedtime with SSI for tighter control.  Titrate medications as indicated.   9. COPD with SOB/OSA: Does not use CPAP. No current respiratory issues 10 Morbid obesity: Encourage weight loss diet. 11. HTN: Monitor BP bid. Continue Norvasc daily.   12. Candida vaginitis:  Treat with diflucan X 2 doses.                          LOS (Days) 1 A FACE TO FACE EVALUATION WAS PERFORMED  Leelynd Maldonado E 09/16/2015, 9:42 AM

## 2015-09-16 NOTE — Progress Notes (Signed)
Occupational Therapy Session Note  Patient Details  Name: [name] MRN: [id number] Date of Birth: 1955-08-14  Today's Date: 09/16/2015 OT Individual Time: 9449-6759 OT Individual Time Calculation (min): 46 min    Short Term Goals: Week 1:  OT Short Term Goal 1 (Week 1): Pt will complete tolet transfer using sliding board to drop arm commode with supervision. OT Short Term Goal 2 (Week 1): Pt will complete clothing management and hygiene with toileting with supervision. OT Short Term Goal 3 (Week 1): Pt will perform LB bathing with supervision including lateral leans side to side. OT Short Term Goal 4 (Week 1): Pt will complete simulated shower tub transfer with supervision using sliding board and tub bench.    Skilled Therapeutic Interventions/Progress Updates:  Pt worked on toilet transfers with min assist using the sliding board.  Also completed toilet hygiene as well in sitting with lateral leans side to side and min support from therapist.  Educated pt on the need to place the drop arm commode next to the bed at home so that this will allow for greater lean to the side in order to manage clothing.  Pt transferred to EOB with sliding board as well with therapist placing the board in the correct position under pt's hip on both occasions.  Completed squat pivot transfer with mod assist to wheelchair without use of sliding board as well.  Had pt propel wheelchair down to the ADL apartment where therapist allowed her to see a tub bench so she would be more familiar with equipment needed for home.  Pt reports having a smaller shower seat but no bench.  Therapist assisted with transport back to the room via wheelchair.  Pt was left up in wheelchair with family present.    Therapy Documentation Precautions:  Precautions Precautions: Fall Required Braces or Orthoses: Knee Immobilizer - Left Knee Immobilizer - Left: On at all times Other Brace/Splint: adjusted metal stay to lateral side of knee  to decrease pressure and pain on end of L LE Restrictions Weight Bearing Restrictions: Yes LLE Weight Bearing: Non weight bearing  Pain: Pain Assessment Pain Assessment: 0-10 Pain Score: 4  Pain Type: Surgical pain Pain Location: Leg Pain Orientation: Left Pain Descriptors / Indicators: Aching Pain Frequency: Intermittent Pain Onset: On-going Pain Intervention(s): Medication (See eMAR) ADL: See Function Navigator for Current Functional Status.   Therapy/Group: Individual Therapy  Dynesha Woolen OTR/L 09/16/2015, 5:11 PM

## 2015-09-17 ENCOUNTER — Inpatient Hospital Stay (HOSPITAL_COMMUNITY): Payer: Medicaid Other | Admitting: Physical Therapy

## 2015-09-17 LAB — GLUCOSE, CAPILLARY
GLUCOSE-CAPILLARY: 117 mg/dL — AB (ref 65–99)
Glucose-Capillary: 104 mg/dL — ABNORMAL HIGH (ref 65–99)
Glucose-Capillary: 160 mg/dL — ABNORMAL HIGH (ref 65–99)
Glucose-Capillary: 107 mg/dL — ABNORMAL HIGH (ref 65–99)

## 2015-09-17 NOTE — Progress Notes (Addendum)
60 y.o. 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 She elected to go Home and was admitted on 07/15 pm with fever and SIR reaction due to wound infection. She was started on broad spectrum antibiotics and MRI foot showed edema but no evidence of osteo. Wound continue to decline and she underwent L-BKA by Dr. Sharol Given on 09/13/15.  Subjective/Complaints: Patient states that she was up every 4 hours last night for pain medications. She has knee osteoarthritis and some chronic shoulder pain, which she feels may have been aggravated by some of her exercises in therapy. No falls or other new trauma. Continues to have left stump pain as well. Review systems denies chest pain, shortness of breath, nausea, vomiting, diarrhea or constipation.  Objective: Vital Signs: Blood pressure (!) 156/84, pulse 63, temperature 98.4 F (36.9 C), temperature source Oral, resp. rate 18, SpO2 94 %. No results found. Results for orders placed or performed during the hospital encounter of 09/15/15 (from the past 72 hour(s))  CBC     Status: Abnormal   Collection Time: 09/15/15  8:27 PM  Result Value Ref Range   WBC 7.3 4.0 - 10.5 K/uL   RBC 4.31 3.87 - 5.11 MIL/uL   Hemoglobin 11.5 (L) 12.0 - 15.0 g/dL   HCT 37.1 36.0 - 46.0 %   MCV 86.1 78.0 - 100.0 fL   MCH 26.7 26.0 - 34.0 pg   MCHC 31.0 30.0 - 36.0 g/dL   RDW 13.8 11.5 - 15.5 %   Platelets 268 150 - 400 K/uL  Glucose, capillary     Status: None   Collection Time: 09/15/15  9:53 PM  Result Value Ref Range   Glucose-Capillary 94 65 - 99 mg/dL  CBC WITH DIFFERENTIAL     Status: None   Collection Time: 09/16/15  5:46 AM  Result Value Ref Range   WBC 6.9 4.0 - 10.5 K/uL   RBC 4.73 3.87 - 5.11 MIL/uL   Hemoglobin  12.6 12.0 - 15.0 g/dL   HCT 40.6 36.0 - 46.0 %   MCV 85.8 78.0 - 100.0 fL   MCH 26.6 26.0 - 34.0 pg   MCHC 31.0 30.0 - 36.0 g/dL   RDW 13.8 11.5 - 15.5 %   Platelets 253 150 - 400 K/uL   Neutrophils Relative % 56 %   Neutro Abs 3.8 1.7 - 7.7 K/uL   Lymphocytes Relative 32 %   Lymphs Abs 2.2 0.7 - 4.0 K/uL   Monocytes Relative 5 %   Monocytes Absolute 0.3 0.1 - 1.0 K/uL   Eosinophils Relative 7 %   Eosinophils Absolute 0.5 0.0 - 0.7 K/uL   Basophils Relative 0 %   Basophils Absolute 0.0 0.0 - 0.1 K/uL  Comprehensive metabolic panel     Status: Abnormal   Collection Time: 09/16/15  5:46 AM  Result Value Ref Range   Sodium 141 135 - 145 mmol/L   Potassium 4.1 3.5 - 5.1 mmol/L   Chloride 104 101 - 111 mmol/L   CO2 30 22 - 32 mmol/L   Glucose, Bld 98 65 - 99 mg/dL   BUN 15 6 - 20 mg/dL   Creatinine, Ser 1.15 (H) 0.44 - 1.00 mg/dL   Calcium 9.1 8.9 - 10.3 mg/dL   Total Protein 7.2 6.5 - 8.1 g/dL  Albumin 3.3 (L) 3.5 - 5.0 g/dL   AST 20 15 - 41 U/L   ALT 14 14 - 54 U/L   Alkaline Phosphatase 84 38 - 126 U/L   Total Bilirubin 0.6 0.3 - 1.2 mg/dL   GFR calc non Af Amer 52 (L) >60 mL/min   GFR calc Af Amer >60 >60 mL/min    Comment: (NOTE) The eGFR has been calculated using the CKD EPI equation. This calculation has not been validated in all clinical situations. eGFR's persistently <60 mL/min signify possible Chronic Kidney Disease.    Anion gap 7 5 - 15  Glucose, capillary     Status: None   Collection Time: 09/16/15  6:37 AM  Result Value Ref Range   Glucose-Capillary 86 65 - 99 mg/dL  Glucose, capillary     Status: None   Collection Time: 09/16/15 12:06 PM  Result Value Ref Range   Glucose-Capillary 91 65 - 99 mg/dL  Glucose, capillary     Status: None   Collection Time: 09/16/15  4:39 PM  Result Value Ref Range   Glucose-Capillary 96 65 - 99 mg/dL  Glucose, capillary     Status: None   Collection Time: 09/16/15  8:59 PM  Result Value Ref Range   Glucose-Capillary  97 65 - 99 mg/dL  Glucose, capillary     Status: Abnormal   Collection Time: 09/17/15  6:58 AM  Result Value Ref Range   Glucose-Capillary 104 (H) 65 - 99 mg/dL     HEENT: Tracheostomy with cap Cardio: RRR and No murmur Resp: CTA B/L and On labored GI: CTA B/L and Unlabored Extremity:  Edema Left stump Skin:   Other [place] wrapping left stump Neuro: Alert/Oriented and Other Uses assistive device for communication, status post laryngectomy Musc/Skel:  Other No pain with upper limb range of motion. No pain with right lower limb range of motion.Has pain with flexion-extension of the left knee. General stress   Assessment/Plan: 1. Functional deficits secondary to Left below-knee amputation which require 3+ hours per day of interdisciplinary therapy in a comprehensive inpatient rehab setting. Physiatrist is providing close team supervision and 24 hour management of active medical problems listed below. Physiatrist and rehab team continue to assess barriers to discharge/monitor patient progress toward functional and medical goals. FIM: Function - Bathing Position: Sitting EOB Body parts bathed by patient: Right arm, Left arm, Chest, Abdomen, Front perineal area, Buttocks, Right upper leg, Left upper leg, Right lower leg Body parts bathed by helper: Back Bathing not applicable: Left lower leg Assist Level: Touching or steadying assistance(Pt > 75%)  Function- Upper Body Dressing/Undressing What is the patient wearing?: Pull over shirt/dress Pull over shirt/dress - Perfomed by patient: Thread/unthread right sleeve, Thread/unthread left sleeve, Put head through opening, Pull shirt over trunk Assist Level: Set up Function - Lower Body Dressing/Undressing What is the patient wearing?: Non-skid slipper socks, Pants Position: Sitting EOB Pants- Performed by patient: Thread/unthread right pants leg Pants- Performed by helper: Pull pants up/down, Thread/unthread left pants leg Non-skid slipper  socks- Performed by patient: Don/doff right sock  Function - Toileting Toileting steps completed by patient: Adjust clothing prior to toileting, Performs perineal hygiene, Adjust clothing after toileting Assist level: Touching or steadying assistance (Pt.75%)  Function - Toilet Transfers Toilet transfer assistive device: Drop arm commode, Sliding board Assist level to toilet: Moderate assist (Pt 50 - 74%/lift or lower) Assist level from toilet: Moderate assist (Pt 50 - 74%/lift or lower)     Function - Chair/bed  transfer Chair/bed transfer method: Lateral scoot Chair/bed transfer assist level: Moderate assist (Pt 50 - 74%/lift or lower) Chair/bed transfer assistive device: Sliding board, Armrests Chair/bed transfer details: Tactile cues for weight shifting, Tactile cues for posture, Tactile cues for placement, Verbal cues for sequencing, Verbal cues for technique, Verbal cues for precautions/safety, Verbal cues for safe use of DME/AE  Function - Locomotion: Wheelchair Will patient use wheelchair at discharge?: Yes Type: Manual Max wheelchair distance: 169f Assist Level: Supervision or verbal cues Assist Level: Supervision or verbal cues Assist Level: Supervision or verbal cues Function - Locomotion: Ambulation Ambulation activity did not occur: Safety/medical concerns Walk 10 feet activity did not occur: Safety/medical concerns Walk 50 feet with 2 turns activity did not occur: Safety/medical concerns Walk 150 feet activity did not occur: Safety/medical concerns Walk 10 feet on uneven surfaces activity did not occur: Safety/medical concerns  Function - Comprehension Comprehension: Auditory Comprehension assist level: Follows basic conversation/direction with no assist  Function - Expression Expression: Verbal Expression assistive device: Other (Comment) (voice box) Expression assist level: Expresses basic needs/ideas: With no assist  Function - Social Interaction Social  Interaction assist level: Interacts appropriately with others - No medications needed.  Function - Problem Solving Problem solving assist level: Solves basic problems with no assist  Function - Memory Memory assist level: Complete Independence: No helper Patient normally able to recall (first 3 days only): Current season, Location of own room, Staff names and faces, That he or she is in a hospital  Medical Problem List and Plan: 1.  Functional and mobility deficits secondary to left BKA, continue CIR level PT, OT  2.  DVT Prophylaxis/Anticoagulation: Pharmaceutical: Lovenox, 40 mg per day, monitor for signs of DVT 3. Chronic pain/Pain Management: Was on Suboxone tid with lyrica qid (has been refusing).  Post op on percocet, oxycodone and IV dilaudid to help with pain and baclofen tid to help manage spams.  Will change to po dilaudid prn to simplify regimen. Continue suboxone . This may be inhibiting effectiveness of Dilaudid. however 4. Mood: Team to provide ego support. LCSW to follow for evaluation and support.   5. Neuropsych: This patient is capable of making decisions on her own behalf. 6. Skin/Wound Care: routine pressure relief measures.  Continue compressive dressing L-BKA for 7 days per protocol 7. Fluids/Electrolytes/Nutrition: Monitor I/O. Check lytes in am.   8. DM type 2 with insensate neuropathy: Monitor BS ac/hs. Continue lantus insulin at bedtime with SSI for tighter control.  Titrate medications as indicated.   9. COPD with SOB/OSA: Does not use CPAP. No current respiratory issues 10 Morbid obesity: Encourage weight loss diet. 11. HTN: Monitor BP bid. Continue Norvasc daily.   systolic, mildly elevated this morning 12. Candida vaginitis:  Treat with diflucan X 2 doses.   13. History of laryngeal carcinoma status post laryngectomy LOS (Days) 2 A FACE TO FACE EVALUATION WAS PERFORMED  [name] 09/17/2015, 9:46 AM

## 2015-09-17 NOTE — Progress Notes (Signed)
Physical Therapy Session Note  Patient Details  Name: [name] MRN: [id number] Date of Birth: 1955/11/25  Today's Date: 09/17/2015 PT Individual Time: 9826-4158 PT Individual Time Calculation (min): 27 min    Skilled Therapeutic Interventions/Progress Updates:    Pt received in bed & agreeable to PT, noting 6/10 R hip & shoulder arthritic pain & RN made aware. Pt lying with foot of bed raised & multiple pillows under LLE. Educated pt on contractures, contracture prevention, and positioning of pillows under LLE; pt voiced understanding. During session pt performed multiple sliding board transfers (bed>BSC, BSC>bed, bed>w/c) with min/mod A overall. Pt required cuing for anterior weight shift, push up with BUE & to scoot buttocks across board. Pt with good carryover & demonstration with cuing; but reports she forgets technique when she gets in a hurry. At end of session pt left sitting in w/c with LLE on amputee support pad; educated pt to have nursing staff assist with elevating amputee pad ~30 minutes to increase L knee extension & pt agreeable. Pt left with all needs within reach.   Therapy Documentation Precautions:  Precautions Precautions: Fall Required Braces or Orthoses: Knee Immobilizer - Left Knee Immobilizer - Left: On at all times Other Brace/Splint: adjusted metal stay to lateral side of knee to decrease pressure and pain on end of L LE Restrictions Weight Bearing Restrictions: Yes LLE Weight Bearing: Non weight bearing  Pain: Pain Assessment Pain Assessment: 0-10 Pain Score: 6  Pain Type: Chronic pain (arthritic pain) Pain Location:  (R hip & shoulder) Pain Descriptors / Indicators: Aching Pain Intervention(s): RN made aware    See Function Navigator for Current Functional Status.   Therapy/Group: Individual Therapy  Waunita Schooner 09/17/2015, 8:45 AM

## 2015-09-18 ENCOUNTER — Inpatient Hospital Stay (HOSPITAL_COMMUNITY): Payer: Medicaid Other | Admitting: Occupational Therapy

## 2015-09-18 ENCOUNTER — Inpatient Hospital Stay (HOSPITAL_COMMUNITY): Payer: Medicaid Other | Admitting: Physical Therapy

## 2015-09-18 DIAGNOSIS — R6510 Systemic inflammatory response syndrome (SIRS) of non-infectious origin without acute organ dysfunction: Secondary | ICD-10-CM

## 2015-09-18 DIAGNOSIS — R269 Unspecified abnormalities of gait and mobility: Secondary | ICD-10-CM | POA: Insufficient documentation

## 2015-09-18 DIAGNOSIS — Z89519 Acquired absence of unspecified leg below knee: Secondary | ICD-10-CM

## 2015-09-18 DIAGNOSIS — R030 Elevated blood-pressure reading, without diagnosis of hypertension: Secondary | ICD-10-CM

## 2015-09-18 DIAGNOSIS — G894 Chronic pain syndrome: Secondary | ICD-10-CM

## 2015-09-18 DIAGNOSIS — R0989 Other specified symptoms and signs involving the circulatory and respiratory systems: Secondary | ICD-10-CM | POA: Insufficient documentation

## 2015-09-18 DIAGNOSIS — E1142 Type 2 diabetes mellitus with diabetic polyneuropathy: Secondary | ICD-10-CM

## 2015-09-18 LAB — GLUCOSE, CAPILLARY
GLUCOSE-CAPILLARY: 87 mg/dL (ref 65–99)
GLUCOSE-CAPILLARY: 101 mg/dL — AB (ref 65–99)
GLUCOSE-CAPILLARY: 113 mg/dL — AB (ref 65–99)
Glucose-Capillary: 109 mg/dL — ABNORMAL HIGH (ref 65–99)

## 2015-09-18 MED ORDER — SENNOSIDES-DOCUSATE SODIUM 8.6-50 MG PO TABS
2.0000 | ORAL_TABLET | Freq: Two times a day (BID) | ORAL | Status: DC
  Administered 2015-09-18 – 2015-09-26 (×17): 2 via ORAL
  Filled 2015-09-18 (×17): qty 2

## 2015-09-18 MED ORDER — OXYCODONE HCL 5 MG PO TABS
5.0000 mg | ORAL_TABLET | ORAL | Status: DC | PRN
  Administered 2015-09-18 – 2015-09-21 (×14): 10 mg via ORAL
  Filled 2015-09-18 (×14): qty 2

## 2015-09-18 MED ORDER — GABAPENTIN 300 MG PO CAPS
900.0000 mg | ORAL_CAPSULE | Freq: Three times a day (TID) | ORAL | Status: DC
  Administered 2015-09-18 (×2): 900 mg via ORAL
  Filled 2015-09-18 (×2): qty 3

## 2015-09-18 NOTE — IPOC Note (Addendum)
Overall Plan of Care (IPOC) Patient Details Name: [name] MRN: [id number] DOB: 07/02/1955  Admitting Diagnosis: BKA  Hospital Problems: Active Problems:   Unilateral complete BKA ([place])   Status post below knee amputation of left lower extremity (HCC)   Simple chronic bronchitis (HCC)   Primary osteoarthritis of right knee   Labile blood pressure   DM type 2 with diabetic peripheral neuropathy (HCC)   Abnormality of gait     Functional Problem List: Nursing Bowel, Endurance, Medication Management, Pain, Safety, Sensory, Skin Integrity  PT Endurance, Motor, Pain, Sensory  OT Balance, Motor, Endurance, Pain  SLP    TR         Basic ADL's: OT Bathing, Grooming     Advanced  ADL's: OT Simple Meal Preparation     Transfers: PT Bed to Chair, Bed Mobility, Car, Furniture, Floor  OT Toilet, Metallurgist: PT Ambulation, Emergency planning/management officer, Stairs     Additional Impairments: OT None  SLP        TR      Anticipated Outcomes Item Anticipated Outcome  Self Feeding independent  Swallowing      Basic self-care  supervision  Toileting  supervision   Bathroom Transfers supervision  Bowel/Bladder  Remain continent, no retention, no s/s infection.  BM QD, QOD with PRN meds.  Transfers  Mod I with LRAD.   Locomotion  Mod I at [facility] level.   Communication     Cognition     Pain  Managed at goal 3/10. Phantom pain, sensations decreased; educate on techniques, self manage.  Safety/Judgment  Increased safety awareness; no falls, injury this admission.   Therapy Plan: PT Intensity: Minimum of 1-2 x/day ,45 to 90 minutes PT Frequency: 5 out of 7 days PT Duration Estimated Length of Stay: 10-14 days.  OT Intensity: Minimum of 1-2 x/day, 45 to 90 minutes OT Frequency: 5 out of 7 days OT Duration/Estimated Length of Stay: 10-12 days         Team Interventions: Nursing Interventions Patient/Family Education, Medication Management, Psychosocial  Support, Pain Management, Discharge Planning, Disease Management/Prevention, Bowel Management, Skin Care/Wound Management  PT interventions Ambulation/gait training, Balance/vestibular training, Cognitive remediation/compensation, Community reintegration, Discharge planning, Disease management/prevention, DME/adaptive equipment instruction, Functional mobility training, Neuromuscular re-education, Pain management, Patient/family education, Psychosocial support, Skin care/wound management, Splinting/orthotics, Stair training, Therapeutic Activities, Therapeutic Exercise, UE/LE Strength taining/ROM, UE/LE Coordination activities, Visual/perceptual remediation/compensation, Wheelchair propulsion/positioning  OT Interventions Training and development officer, Academic librarian, Discharge planning, Engineer, drilling, Neuromuscular re-education, Self Care/advanced ADL retraining, UE/LE Strength taining/ROM, Patient/family education, Therapeutic Activities, Functional mobility training, Therapeutic Exercise, Wheelchair propulsion/positioning  SLP Interventions    TR Interventions    SW/CM Interventions Discharge Planning, Barrister's clerk, Patient/Family Education    Team Discharge Planning: Destination: PT-Home ,OT- Home , SLP-  Projected Follow-up: PT-Home health PT, OT-  Home health OT, SLP-  Projected Equipment Needs: PT-To be determined, OT- Tub/shower bench, SLP-  Equipment Details: PT- , OT-  Patient/family involved in discharge planning: PT- Patient, Family member/caregiver,  OT-Patient, SLP-   MD ELOS: 10-12 days. Medical Rehab Prognosis:  Good Assessment:  60 y.o. 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 She elected to  gohome and was admitted on 07/15 pm with fever and SIR reaction due to wound  infection. She was started on broad spectrum antibiotics and MRI foot showed edema but no evidence of osteo. Wound continue to decline and she underwent L-BKA by Dr. Sharol Given on 09/13/15. Post op with acute on chronic pain with high levels of anxiety. Pt with resulting functional gait deficits and transfers. Will set goals for supervision with therapies.    See Team Conference Notes for weekly updates to the plan of care

## 2015-09-18 NOTE — Progress Notes (Signed)
Patient information reviewed and entered into [facility] system by Breena Bevacqua, RN, CRRN, PPS Coordinator.  Information including medical coding and functional independence measure will be reviewed and updated through discharge.    

## 2015-09-18 NOTE — Progress Notes (Signed)
Physical Therapy Session Note  Patient Details  Name: [name] MRN: [id number] Date of Birth: [birth year]  Today's Date: 09/18/2015 PT Individual Time: 0932-1020 PT Individual Time Calculation (min): 48 min    Short Term Goals: Week 1:  PT Short Term Goal 1 (Week 1): =LTGs due to ELOS  Skilled Therapeutic Interventions/Progress Updates:  Pt received resting in w/c, reports urgent need to use bathroom, no c/o pain.  Slide board transfer w/c<>BSC with steady assist and PT set up of board.  Pt demos good forward weight shift and able to perform sit<>squat for clothing management and lateral leans for hygiene.  Once returned to w/c PT discussed with pt d/c plan home, home set up, and equipment.  Pt reports her main concerns for d/c home are that her bed is very low to the ground and difficult to stand from; PT educated on use of slide board in circumstances where standing is too challenging and pt verbalized understanding.  Also discussed sending home a measurement sheet to determine exact height of bed, couch, and car seat.  Pt states she knows w/c will not fit into bathroom or kitchen as she was using one PTA.   W/C propulsion x300' to ortho gym with supervision.  Pt performed 15 reps LAQ on LLE for increased ROM and strengthening.  PT instructed pt in car transfer with slide board at simulated small SUV height with steady assist and mod verbal cues for safe sequencing.  Pt returned to room at end of session and left upright in w/c with call bell in reach and needs met.   Therapy Documentation Precautions:  Precautions Precautions: Fall Required Braces or Orthoses: Knee Immobilizer - Left Knee Immobilizer - Left: On at all times Other Brace/Splint: adjusted metal stay to lateral side of knee to decrease pressure and pain on end of L LE Restrictions Weight Bearing Restrictions: Yes LLE Weight Bearing: Non weight bearing   See Function Navigator for Current Functional  Status.   Therapy/Group: Individual Therapy  Mechell Girgis E Penven-Crew 09/18/2015, 11:50 AM

## 2015-09-18 NOTE — Progress Notes (Signed)
Occupational Therapy Session Note  Patient Details  Name: [name] MRN: [id number] Date of Birth: Nov 14, 1955  Today's Date: 09/18/2015 OT Individual Time: 1435-1540 and [phone] OT Individual Time Calculation (min): 65 min and 62 minutes    Short Term Goals: Week 1:  OT Short Term Goal 1 (Week 1): Pt will complete tolet transfer using sliding board to drop arm commode with supervision. OT Short Term Goal 2 (Week 1): Pt will complete clothing management and hygiene with toileting with supervision. OT Short Term Goal 3 (Week 1): Pt will perform LB bathing with supervision including lateral leans side to side. OT Short Term Goal 4 (Week 1): Pt will complete simulated shower tub transfer with supervision using sliding board and tub bench.    Skilled Therapeutic Interventions/Progress Updates:    Pt was lying in bed with son this morning and was agreeable to participate in morning ADLs. Bed modified to simulate home environment (i.e. Regular bed without bed rails). Pt completed UB ADLs with setup, LB bathing with supervision and LB dressing with steadying assistance when rolling to pull up pants in bed. Pt required cuing for proper body placement and safety when completing LB dressing portions in bed. Knee immobilizer doffed to wash upper portion of leg and pt able to remove most of dye. Knee immobilizer donned with Mod A with metal on left outer part of leg per pt comfort. Pt completed functional slideboard transfer from bed to w/c with self placement of board with steadying assistance. Pt engaged in discussion regarding home kitchen setup. Pt reported that kitchen is too small to accommodate w/c with pt having to descend steps to get into the "kitchen wheelchair." Pt and son educated on having snacks in pts room for safe and easy access with verbalized understanding. Pt left in room with son upon skilled OT departure.   2nd Session 1:1 Tx 65 minutes Pt seen in afternoon session for simulated LB  dressing completion in therapy apartment with use of regular bed. Pt completed graded theraband dressing activity at EOB with lateral leans and also bedlevel with mod instruction on safe technique to avoid rolling too close to edge of bed. Pt reported having wall at end of bed to roll towards to safely get up. Pt completed simulated LB dressing with close supervision. Slideboard transfer completed with supervision for providing cues for self setup.  For last portion of session, pt was taken to gift shop for w/c navigation in tight spaces and improving w/c safety. Pt reported often going out with daughter to thrift shop. Pt required mod vcs for locking w/c brakes when stopping for items and when leaning forward. Pt able to navigate through tight spaces with extra time with cues to avoid using nearby furniture/store items. Pt returned to room and left with son. Pt reported enjoying this afternoon's therapy session due to enjoyment when shopping.   Therapy Documentation Precautions:  Precautions Precautions: Fall Required Braces or Orthoses: Knee Immobilizer - Left Knee Immobilizer - Left: On at all times Other Brace/Splint: adjusted metal stay to lateral side of knee to decrease pressure and pain on end of L LE Restrictions Weight Bearing Restrictions: Yes LLE Weight Bearing: Non weight bearing General:   Vital Signs: Therapy Vitals Temp: 99.1 F (37.3 C) Temp Source: Oral Pulse Rate: 99 Resp: 17 BP: (!) 158/81 Patient Position (if appropriate): Sitting Oxygen Therapy SpO2: 97 % O2 Device: Not Delivered Pain: Pain manageable with recovery periods and breaks for massaging/tapping residual limb Pain Assessment Pain  Assessment: 0-10 Pain Score: 4  Pain Type: Acute pain;Surgical pain Pain Location: Leg Pain Orientation: Left Pain Descriptors / Indicators: Aching Pain Frequency: Constant Pain Onset: On-going Pain Intervention(s): Medication (See eMAR)     See Function Navigator for  Current Functional Status.   Therapy/Group: Individual Therapy  Alaena Strader A Dinita Migliaccio 09/18/2015, 4:02 PM

## 2015-09-18 NOTE — Plan of Care (Signed)
Problem: RH Car Transfers Goal: LTG Patient will perform car transfers with assist (PT) LTG: Patient will perform car transfers with assistance (PT).  Upgraded due to pt progress

## 2015-09-18 NOTE — Progress Notes (Signed)
Subjective/Complaints: Pt sitting up in bed playing candy crush.    Review systems: denies chest pain, shortness of breath, nausea, vomiting, diarrhea.  Objective: Vital Signs: Blood pressure 135/81, pulse 68, temperature 98.1 F (36.7 C), temperature source Oral, resp. rate 18, SpO2 95 %. No results found. Results for orders placed or performed during the hospital encounter of 09/15/15 (from the past 72 hour(s))  CBC     Status: Abnormal   Collection Time: 09/15/15  8:27 PM  Result Value Ref Range   WBC 7.3 4.0 - 10.5 K/uL   RBC 4.31 3.87 - 5.11 MIL/uL   Hemoglobin 11.5 (L) 12.0 - 15.0 g/dL   HCT 37.1 36.0 - 46.0 %   MCV 86.1 78.0 - 100.0 fL   MCH 26.7 26.0 - 34.0 pg   MCHC 31.0 30.0 - 36.0 g/dL   RDW 13.8 11.5 - 15.5 %   Platelets 268 150 - 400 K/uL  Glucose, capillary     Status: None   Collection Time: 09/15/15  9:53 PM  Result Value Ref Range   Glucose-Capillary 94 65 - 99 mg/dL  CBC WITH DIFFERENTIAL     Status: None   Collection Time: 09/16/15  5:46 AM  Result Value Ref Range   WBC 6.9 4.0 - 10.5 K/uL   RBC 4.73 3.87 - 5.11 MIL/uL   Hemoglobin 12.6 12.0 - 15.0 g/dL   HCT 40.6 36.0 - 46.0 %   MCV 85.8 78.0 - 100.0 fL   MCH 26.6 26.0 - 34.0 pg   MCHC 31.0 30.0 - 36.0 g/dL   RDW 13.8 11.5 - 15.5 %   Platelets 253 150 - 400 K/uL   Neutrophils Relative % 56 %   Neutro Abs 3.8 1.7 - 7.7 K/uL   Lymphocytes Relative 32 %   Lymphs Abs 2.2 0.7 - 4.0 K/uL   Monocytes Relative 5 %   Monocytes Absolute 0.3 0.1 - 1.0 K/uL   Eosinophils Relative 7 %   Eosinophils Absolute 0.5 0.0 - 0.7 K/uL   Basophils Relative 0 %   Basophils Absolute 0.0 0.0 - 0.1 K/uL  Comprehensive metabolic panel     Status: Abnormal   Collection Time: 09/16/15  5:46 AM  Result Value Ref Range   Sodium 141 135 - 145 mmol/L   Potassium 4.1 3.5 - 5.1 mmol/L   Chloride 104 101 - 111 mmol/L   CO2 30 22 - 32 mmol/L   Glucose, Bld 98 65 - 99 mg/dL   BUN 15 6 - 20 mg/dL   Creatinine, Ser 1.15 (H) 0.44  - 1.00 mg/dL   Calcium 9.1 8.9 - 10.3 mg/dL   Total Protein 7.2 6.5 - 8.1 g/dL   Albumin 3.3 (L) 3.5 - 5.0 g/dL   AST 20 15 - 41 U/L   ALT 14 14 - 54 U/L   Alkaline Phosphatase 84 38 - 126 U/L   Total Bilirubin 0.6 0.3 - 1.2 mg/dL   GFR calc non Af Amer 52 (L) >60 mL/min   GFR calc Af Amer >60 >60 mL/min    Comment: (NOTE) The eGFR has been calculated using the CKD EPI equation. This calculation has not been validated in all clinical situations. eGFR's persistently <60 mL/min signify possible Chronic Kidney Disease.    Anion gap 7 5 - 15  Glucose, capillary     Status: None   Collection Time: 09/16/15  6:37 AM  Result Value Ref Range   Glucose-Capillary 86 65 - 99 mg/dL  Glucose, capillary  Status: None   Collection Time: 09/16/15 12:06 PM  Result Value Ref Range   Glucose-Capillary 91 65 - 99 mg/dL  Glucose, capillary     Status: None   Collection Time: 09/16/15  4:39 PM  Result Value Ref Range   Glucose-Capillary 96 65 - 99 mg/dL  Glucose, capillary     Status: None   Collection Time: 09/16/15  8:59 PM  Result Value Ref Range   Glucose-Capillary 97 65 - 99 mg/dL  Glucose, capillary     Status: Abnormal   Collection Time: 09/17/15  6:58 AM  Result Value Ref Range   Glucose-Capillary 104 (H) 65 - 99 mg/dL  Glucose, capillary     Status: Abnormal   Collection Time: 09/17/15 11:51 AM  Result Value Ref Range   Glucose-Capillary 117 (H) 65 - 99 mg/dL  Glucose, capillary     Status: Abnormal   Collection Time: 09/17/15  4:30 PM  Result Value Ref Range   Glucose-Capillary 160 (H) 65 - 99 mg/dL  Glucose, capillary     Status: Abnormal   Collection Time: 09/17/15  8:39 PM  Result Value Ref Range   Glucose-Capillary 107 (H) 65 - 99 mg/dL  Glucose, capillary     Status: Abnormal   Collection Time: 09/18/15  6:38 AM  Result Value Ref Range   Glucose-Capillary 109 (H) 65 - 99 mg/dL     General: NAD. Vital signs reviewed. HEENT: Normocephalic, atraumatic. +Assistive  device for communication, status post laryngectomy Cardio: RRR and No murmur Resp: CTA B/L and On labored GI: CTA B/L and Unlabored Musc/Skel:  +TTP medial left knee. No edema.   Neuro: Alert and Oriented.  Motor: 5/5 B/l UE and RLE Skin:   Coban wrapping left stump  Assessment/Plan: 1. Functional deficits secondary to Left below-knee amputation which require 3+ hours per day of interdisciplinary therapy in a comprehensive inpatient rehab setting. Physiatrist is providing close team supervision and 24 hour management of active medical problems listed below. Physiatrist and rehab team continue to assess barriers to discharge/monitor patient progress toward functional and medical goals. FIM: Function - Bathing Position: Sitting EOB Body parts bathed by patient: Right arm, Left arm, Chest, Abdomen, Front perineal area, Buttocks, Right upper leg, Left upper leg, Right lower leg Body parts bathed by helper: Back Bathing not applicable: Left lower leg Assist Level: Touching or steadying assistance(Pt > 75%)  Function- Upper Body Dressing/Undressing What is the patient wearing?: Pull over shirt/dress Pull over shirt/dress - Perfomed by patient: Thread/unthread right sleeve, Thread/unthread left sleeve, Put head through opening, Pull shirt over trunk Assist Level: Set up Function - Lower Body Dressing/Undressing What is the patient wearing?: Non-skid slipper socks, Pants Position: Sitting EOB Pants- Performed by patient: Thread/unthread right pants leg Pants- Performed by helper: Pull pants up/down, Thread/unthread left pants leg Non-skid slipper socks- Performed by patient: Don/doff right sock  Function - Toileting Toileting steps completed by patient: Adjust clothing prior to toileting, Performs perineal hygiene, Adjust clothing after toileting Assist level: Touching or steadying assistance (Pt.75%)  Function - Toilet Transfers Toilet transfer assistive device: Drop arm commode, Sliding  board Assist level to toilet: Moderate assist (Pt 50 - 74%/lift or lower) Assist level from toilet: Moderate assist (Pt 50 - 74%/lift or lower)     Function - Chair/bed transfer Chair/bed transfer method: Lateral scoot Chair/bed transfer assist level: Moderate assist (Pt 50 - 74%/lift or lower) Chair/bed transfer assistive device: Sliding board, Bedrails, Armrests Chair/bed transfer details: Tactile cues for posture, Verbal  cues for precautions/safety, Verbal cues for sequencing, Verbal cues for technique, Visual cues/gestures for sequencing, Visual cues/gestures for precautions/safety  Function - Locomotion: Wheelchair Will patient use wheelchair at discharge?: Yes Type: Manual Max wheelchair distance: 159f Assist Level: Supervision or verbal cues Assist Level: Supervision or verbal cues Assist Level: Supervision or verbal cues Function - Locomotion: Ambulation Ambulation activity did not occur: Safety/medical concerns Walk 10 feet activity did not occur: Safety/medical concerns Walk 50 feet with 2 turns activity did not occur: Safety/medical concerns Walk 150 feet activity did not occur: Safety/medical concerns Walk 10 feet on uneven surfaces activity did not occur: Safety/medical concerns  Function - Comprehension Comprehension: Auditory Comprehension assist level: Follows complex conversation/direction with no assist  Function - Expression Expression: Verbal Expression assistive device: Other (Comment) (Voice box) Expression assist level: Expresses basic needs/ideas: With no assist  Function - Social Interaction Social Interaction assist level: Interacts appropriately with others - No medications needed.  Function - Problem Solving Problem solving assist level: Solves basic problems with no assist  Function - Memory Memory assist level: Complete Independence: No helper Patient normally able to recall (first 3 days only): Current season, Location of own room, Staff  names and faces, That he or she is in a hospital  Medical Problem List and Plan: 1.  Functional and mobility deficits secondary to left BKA on 7/19  Cont CIR 2.  DVT Prophylaxis/Anticoagulation: Pharmaceutical: Lovenox, 40 mg per day, monitor for signs of DVT 3. Chronic pain/Pain Management: Was on Suboxone tid with lyrica qid (has been refusing).  Post op on percocet, oxycodone and IV dilaudid to help with pain and baclofen tid to help manage spams.  Continue suboxone  Lyrica d/ced per pt request, changed to Gabapentin 900 TID.   Dilaudid changed to Oxycodone 4. Mood: Team to provide ego support. LCSW to follow for evaluation and support.   5. Neuropsych: This patient is capable of making decisions on her own behalf. 6. Skin/Wound Care: routine pressure relief measures.  Continue compressive dressing L-BKA for 7 days per protocol (7/27) 7. Fluids/Electrolytes/Nutrition: Monitor I/O.    8. DM type 2 with insensate neuropathy: Monitor BS ac/hs. Continue lantus insulin at bedtime with SSI for tighter control.  Titrate medications as indicated.    Relatively well controlled at present 9. COPD with SOB/OSA: Does not use CPAP. No current respiratory issues 10 Morbid obesity: Encourage weight loss diet. 11. HTN: Monitor BP bid. Continue Norvasc daily.     Slightly labile, will cont to monitor 12. Candida vaginitis:  Treated with diflucan X 2 doses.  13. History of laryngeal carcinoma status post laryngectomy 14. CKD:  Cr. 1.15 on 7/22  Cont to monitor 15. Constipation:  Increased bowel reg on 7/24  LOS (Days) 3 A FACE TO FACE EVALUATION WAS PERFORMED  Debbora Ang ALorie Phenix7/24/2017, 9:37 AM

## 2015-09-18 NOTE — Progress Notes (Signed)
Physical Therapy Session Note  Patient Details  Name: [name] MRN: [id number] Date of Birth: 1955-07-09  Today's Date: 09/18/2015 PT Individual Time: 1300-1345 PT Individual Time Calculation (min): 45 min    Short Term Goals: Week 1:  PT Short Term Goal 1 (Week 1): =LTGs due to ELOS  Skilled Therapeutic Interventions/Progress Updates:   Pt received resting in w/c with no c/o pain, requesting to use BSC.  Transfer w/c<>BSC with steady assist and slide board, pt able to manage clothing with supervision using lateral leans on BSC.  Pt requesting PT propel w/c to ADL apartment 2/2 fatigue.  PT instructed pt in slide board transfer w/c<>low couch with steady assist/close supervision and mod verbal cues for pt to set up w/c and slide board without assist from PT.  Slide board transfer from w/c<>bed with pt able to set up w/c, board, and transfer with supervision and min verbal cues.  Pt demos improved carryover for forward weight shift and head/hips relationship.  Pt demos supine<>sit in regular bed mod I.  Pt propelled w/c back to room at end of session and positioned upright in w/c with call bell in reach and needs met.   Therapy Documentation Precautions:  Precautions Precautions: Fall Required Braces or Orthoses: Knee Immobilizer - Left Knee Immobilizer - Left: On at all times Other Brace/Splint: adjusted metal stay to lateral side of knee to decrease pressure and pain on end of L LE Restrictions Weight Bearing Restrictions: Yes LLE Weight Bearing: Non weight bearing   See Function Navigator for Current Functional Status.   Therapy/Group: Individual Therapy  Earnest Conroy Penven-Crew 09/18/2015, 1:52 PM

## 2015-09-19 ENCOUNTER — Inpatient Hospital Stay (HOSPITAL_COMMUNITY): Payer: Medicaid Other | Admitting: Physical Therapy

## 2015-09-19 ENCOUNTER — Inpatient Hospital Stay (HOSPITAL_COMMUNITY): Payer: Medicaid Other | Admitting: Occupational Therapy

## 2015-09-19 DIAGNOSIS — M62838 Other muscle spasm: Secondary | ICD-10-CM

## 2015-09-19 DIAGNOSIS — M792 Neuralgia and neuritis, unspecified: Secondary | ICD-10-CM

## 2015-09-19 LAB — GLUCOSE, CAPILLARY
GLUCOSE-CAPILLARY: 142 mg/dL — AB (ref 65–99)
GLUCOSE-CAPILLARY: 84 mg/dL (ref 65–99)
Glucose-Capillary: 102 mg/dL — ABNORMAL HIGH (ref 65–99)
Glucose-Capillary: 109 mg/dL — ABNORMAL HIGH (ref 65–99)

## 2015-09-19 MED ORDER — GABAPENTIN 600 MG PO TABS
600.0000 mg | ORAL_TABLET | Freq: Three times a day (TID) | ORAL | Status: DC
  Administered 2015-09-19 – 2015-09-20 (×4): 600 mg via ORAL
  Filled 2015-09-19 (×6): qty 1

## 2015-09-19 MED ORDER — METHOCARBAMOL 500 MG PO TABS
500.0000 mg | ORAL_TABLET | Freq: Four times a day (QID) | ORAL | Status: DC | PRN
  Administered 2015-09-20 – 2015-09-25 (×8): 500 mg via ORAL
  Filled 2015-09-19 (×8): qty 1

## 2015-09-19 NOTE — Progress Notes (Signed)
Physical Therapy Session Note  Patient Details  Name: [name] MRN: [id number] Date of Birth: [birth year]  Today's Date: 09/19/2015 PT Individual Time: 0902-1002 PT Individual Time Calculation (min): 60 min    Short Term Goals: Week 1:  PT Short Term Goal 1 (Week 1): =LTGs due to ELOS  Skilled Therapeutic Interventions/Progress Updates:    Pt received resting in bed, no c/o pain, and agreeable to therapy session.  Session focus on transfers, dynamic sitting balance, initiation of HEP, and pt education regarding limb wrapping.    Pt transitioned supine>sit mod I with bed rails.  Dynamic sitting balance sitting EOB for lower and upper body dressing and pt performed slide board transfer to w/c with mod assist for set up but distant supervision for transfer.    Pt propelled w/c to and from therapy gym mod I.    Slide board transfer w/c<>therapy mat with distant supervision, pt able to set up with min verbal cues for brakes.  PT instructed pt in BKA HEP x15-20 reps for glute sets, SAQ, side lying hip extension, and LAQ.  HEP handout provided.   Pt education regarding limb wrapping for shaping and edema reduction in preparation for prosthetic fit, handout provided.  Pt still with coban dressing on residual limb but MD provided verbal orders to remove today.  Left pt with handout to discuss further with OT in next session.    Pt returned to room at end of session and left upright in w/c with call bell in reach and needs met.    Therapy Documentation Precautions:  Precautions Precautions: Fall Required Braces or Orthoses: Knee Immobilizer - Left Knee Immobilizer - Left: On at all times Other Brace/Splint: adjusted metal stay to lateral side of knee to decrease pressure and pain on end of L LE Restrictions Weight Bearing Restrictions: Yes LLE Weight Bearing: Non weight bearing   See Function Navigator for Current Functional Status.   Therapy/Group: Individual Therapy  Earnest Conroy  Penven-Crew 09/19/2015, 12:19 PM

## 2015-09-19 NOTE — Progress Notes (Signed)
Physical Therapy Session Note  Patient Details  Name: [name] MRN: [id number] Date of Birth: 14-Oct-1955  Today's Date: 09/19/2015 PT Individual Time: 1445-1600 PT Individual Time Calculation (min): 75 min    Short Term Goals: Week 1:  PT Short Term Goal 1 (Week 1): =LTGs due to ELOS  Skilled Therapeutic Interventions/Progress Updates:    Pt received resting in bed, no c/o pain, and agreeable to therapy session.  Session focus on flexibility, transfers, pt education, and w/c mobility.    Pt currently able to set up transfers for w/c<>bed and w/c<>therapy mat with distant supervision and perform transfers with distant supervision.  BSC<>w/c transfer with slide board with supervision and PT to assist to stabilize board on slick BSC surface.  Discussed using UEs and RLE to lift bottom up during transfers to reduce shear and improve stability for w/c<>BSC transfer.  Pt able to manage clothing and hygiene on [facility] [place] [facility] [place] with distant supervision.    W/C propulsion x>500' with mod I.  Pt able to negotiate doorways, furniture, up to sink, and up/down incline mod I.  Pt managing w/c arm rests and leg rests with supervision and min verbal cues occasionally.    PT instructed pt in sit<>squat x2 with pt demonstrating minimal forward translation of torso and heavy reliance on UEs on w/c armrests for bottom clearance from seat.  Pt reports 6-7/10 pain in R knee from increased use.  Discussed use of ice/heat for relief following therapy sessions to reduce aggravation of pre-morbid arthritis.  Pt verbalized understanding.    Pt also c/o mild tightness in shoulders and neck.  PT instructed pt in stretches for UEs and neck to relieve tightness which pt returned demonstration and notes improvement in comfort.    Pt returned to room at end of session and performed slide board transfer back to bed with supervision only, no verbal cues required.  Pt left supine in bed with call bell in reach and needs met.   Therapy  Documentation Precautions:  Precautions Precautions: Fall Required Braces or Orthoses: Knee Immobilizer - Left Knee Immobilizer - Left: On at all times Other Brace/Splint: adjusted metal stay to lateral side of knee to decrease pressure and pain on end of L LE Restrictions Weight Bearing Restrictions: Yes LLE Weight Bearing: Non weight bearing   See Function Navigator for Current Functional Status.   Therapy/Group: Individual Therapy  Earnest Conroy Penven-Crew 09/19/2015, 5:01 PM

## 2015-09-19 NOTE — Progress Notes (Signed)
Social Work  Social Work Assessment and Plan  Patient Details  Name: [name] MRN: [id number] Date of Birth: 02/04/1956  Today's Date: 09/18/2015  Problem List:  Patient Active Problem List   Diagnosis Date Noted  . Neuropathic pain   . Muscle spasm   . SIRS (systemic inflammatory response syndrome) ([place]) 09/18/2015  . Labile blood pressure   . DM type 2 with diabetic peripheral neuropathy ([place])   . Abnormality of gait   . Unilateral complete BKA ([place]) 09/15/2015  . Status post below knee amputation of left lower extremity ([place])   . Simple chronic bronchitis ([place])   . Primary osteoarthritis of right knee   . Hx of BKA   . Infection   . Sepsis ([place]) 09/09/2015  . Wound infection ([place]) 09/09/2015  . 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 Type 2 diabetes mellitus with circulatory disorder ([place])   . Osteomyelitis of foot, acute, left 08/25/2015  . Diabetic foot infection ([place]) 04/19/2015  . Diabetic foot ulcer ([place]) 04/19/2015  . CKD (chronic kidney disease) stage 3, GFR 30-59 ml/min 04/19/2015  . Head and neck cancer ([place]) 04/19/2015  . Right hip pain 04/19/2015  . Infection of supraglottis (throat) 09/16/2014  . Incidental lung nodule, less than or equal to 49m 09/16/2014  . Obstructive uropathy 03/22/2014  . COPD (chronic obstructive pulmonary disease) ([place] 03/20/2014  . Controlled type 2 diabetes mellitus with diabetic nephropathy ([place] 03/20/2014  . CAP (community acquired pneumonia)   . Lice infested hair 096/22/2979 . Tobacco abuse 08/05/2012  . TINEA CORPORIS 09/01/2009  . DEPRESSION 08/11/2009  . COPD 08/11/2009  . Chronic pain syndrome 04/15/2007  . HYPERLIPIDEMIA NEC/NOS 11/02/2006  . PERIPHERAL VASCULAR DISEASE 11/02/2006  . MORBID OBESITY 10/28/2006  . Obstructive sleep apnea 10/28/2006  . POLYNEUROPATHY 10/28/2006  . Benign essential HTN 10/28/2006  . OSTEOARTHRITIS, KNEES, BILATERAL 10/28/2006  . PROTEINURIA 11/25/2005    Past Medical History:  Past Medical History:  Diagnosis Date  . Anxiety   . Arthritis   . COPD (chronic obstructive pulmonary disease) ([facility]   . Depression   . Diabetes mellitus without complication ([place]    INSULIN DEPENDENT  . Diabetic neuropathy ([facility]   . Difficult intubation    total laryngectomy  . Fibromyalgia   . Head and neck cancer ([facility] 04/19/2015  . Hypertension   . Kidney stone   . Morbid obesity with BMI of 40.0-44.9, adult ([facility]   . Neuropathy ([facility]   . Nocturia   . Pneumonia   . Rash, skin    face  . Renal insufficiency   . Shortness of breath   . Sleep apnea    has not used cpap past 10 yrs  . Urge incontinence    Past Surgical History:  Past Surgical History:  Procedure Laterality Date  . AMPUTATION Left 08/25/2015   Procedure: 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;  Surgeon: MNewt Minion MD;  [facility]  Service: Orthopedics;  Laterality: Left;  . AMPUTATION Left 09/13/2015   Procedure: AMPUTATION BELOW KNEE;  Surgeon: MMarybelle Killings MD;  [facility]  Service: Orthopedics;  Laterality: Left;  . BRAIN SURGERY     vertebral aneurysm  . BREAST LUMPECTOMY  1982   benign  . CARPEL TUNNEL    . CHOLECYSTECTOMY    . CYSTOSCOPY W/ URETERAL STENT PLACEMENT Left 03/22/2014   Procedure: CYSTOSCOPY WITH RETROGRADE PYELOGRAM/URETERAL STENT PLACEMENT;  Surgeon: TAlexis Frock MD;  Location:  [facility];  Service: Urology;  Laterality: Left;  . CYSTOSCOPY WITH RETROGRADE PYELOGRAM, URETEROSCOPY AND STENT PLACEMENT Left 05/11/2014   Procedure: CYSTOSCOPY WITH RETROGRADE PYELOGRAM, URETEROSCOPY AND STENT PLACEMENT;  Surgeon: Alexis Frock, MD;  Location: [facility];  Service: Urology;  Laterality: Left;  . HOLMIUM LASER APPLICATION Left 05/21/7122   Procedure: HOLMIUM LASER APPLICATION;  Surgeon: Alexis Frock, MD;  Location: [facility];  Service: Urology;  Laterality: Left;  . LARYNGECTOMY     total ( stoma)  . LEG AMPUTATION BELOW KNEE Left 09/13/2015  . SALIVARY STONE  REMOVAL    . TONSILLECTOMY    . TUBAL LIGATION     Social History:  reports that she quit smoking about 3 months ago. Her smoking use included Cigarettes. She has a 40.00 pack-year smoking history. She has never used smokeless tobacco. She reports that she does not drink alcohol or use drugs.  Family / Support Systems Marital Status: Married Patient Roles: Spouse, Parent, Other (Comment) (retired Therapist, sports) Spouse/Significant Other: spouse, [name] @ (C) [phone] Children: son, [name] @ [phone] (living in the home with pt/ spouse) and daughter, [name] (local) Anticipated Caregiver: spouse and son Ability/Limitations of Caregiver: no limitations. spouse unemployed Careers adviser: 24/7 Family Dynamics: Son and spouse are supportive and able to assist.    Social History Preferred language: English Religion: Baptist Cultural Background: NA Education: college grad Read: Yes Write: Yes Employment Status: Disabled Date Retired/Disabled/Unemployed: ~5 yrs Freight forwarder Issues: None Guardian/Conservator: None - per MD, pt is capable of making decisions on her own behalf   Abuse/Neglect Physical Abuse: Denies Verbal Abuse: Denies Sexual Abuse: Denies Exploitation of patient/patient's resources: Denies Self-Neglect: Denies  Emotional Status Pt's affect, behavior adn adjustment status: Pt utilizes voicing asist device to complete assessment interview without any difficulty.  Very talkative and humorous.  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  She talks openly about her mood which she describes as "surprisingly good.Marland KitchenMarland KitchenI should be depressed but I'm doing pretty good...".  She denies any s/s of depression or anxiety.  Will monitor and refer for neurospsych as indicated. Recent Psychosocial Issues: Head and neck CA with total laryngectomy in March 2017.  Begain using assistive speaking device in May.  Financial stressors since multiple medical issues began and with husband's  loss of job ~2 yrs ago. Pyschiatric History: Pt reports that she had been prescribed Prozac "years ago but I don't take it anymore."  No formal psych hx/ treatment. 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 Premorbid pt/family roles/activities: Pt was mostly w/c level mobility PTA with "makeshift" ramp (a board across a ditch) to enter home. Anticipated changes in roles/activities/participation: Little change anticipated as family was providing assistance PTA. Pt/family expectations/goals: "I just hope I can learn as much as I can."  [employer]: Other (Comment) (Medicaid) Premorbid Home Care/DME Agencies: Other (Comment) [facility] HH) Transportation available at discharge: their daughter, [name], is only family member with a car.  She helps with all transportation needs. Resource referrals recommended: Support group (specify)  Discharge Planning Living Arrangements: Spouse/significant other, Children Support Systems: Spouse/significant other, Children, Other relatives Type of Residence: Private residence Insurance Resources: Medicaid (specify county) Museum/gallery curator  Resources: SSI Financial Screen Referred: No Living Expenses: Other (Comment) (Home is owned by pt's brother and he allows them to live there rent-free) Money Management: Patient Does the patient have any problems obtaining your medications?: No Home Management: Pt and family Patient/Family Preliminary Plans: Pt to return home with husband and son providing any needed assistance. Social Work Anticipated Follow Up Needs: HH/OP, Support Group, Other (comment) (May benefit from referral  to housing repair programs - will investigate) Expected length of stay: 10-14 days  Clinical Impression Very pleasant woman here following a BKA.  She is a retired Therapist, nutritional as well.  Good family support and very good understanding of her medical issues.  Using assistive speaking device without difficulty due to h/o laryngectomy (Mar 2017).  Denies any significant emotional distress but family with financial stressors due to her health issues and husband's loss of job.  Will consider community resources that may be of assistance.  [name] 09/18/2015, 9:35 AM

## 2015-09-19 NOTE — Progress Notes (Signed)
Subjective/Complaints: Pt laying in bed.  She notes increase in spasms with Gabapentin and would like to decrease the dose.   Review systems: denies chest pain, shortness of breath, nausea, vomiting, diarrhea.  Objective: Vital Signs: Blood pressure 127/73, pulse 71, temperature 98.3 F (36.8 C), temperature source Oral, resp. rate 16, SpO2 96 %. No results found. Results for orders placed or performed during the hospital encounter of 09/15/15 (from the past 72 hour(s))  Glucose, capillary     Status: None   Collection Time: 09/16/15 12:06 PM  Result Value Ref Range   Glucose-Capillary 91 65 - 99 mg/dL  Glucose, capillary     Status: None   Collection Time: 09/16/15  4:39 PM  Result Value Ref Range   Glucose-Capillary 96 65 - 99 mg/dL  Glucose, capillary     Status: None   Collection Time: 09/16/15  8:59 PM  Result Value Ref Range   Glucose-Capillary 97 65 - 99 mg/dL  Glucose, capillary     Status: Abnormal   Collection Time: 09/17/15  6:58 AM  Result Value Ref Range   Glucose-Capillary 104 (H) 65 - 99 mg/dL  Glucose, capillary     Status: Abnormal   Collection Time: 09/17/15 11:51 AM  Result Value Ref Range   Glucose-Capillary 117 (H) 65 - 99 mg/dL  Glucose, capillary     Status: Abnormal   Collection Time: 09/17/15  4:30 PM  Result Value Ref Range   Glucose-Capillary 160 (H) 65 - 99 mg/dL  Glucose, capillary     Status: Abnormal   Collection Time: 09/17/15  8:39 PM  Result Value Ref Range   Glucose-Capillary 107 (H) 65 - 99 mg/dL  Glucose, capillary     Status: Abnormal   Collection Time: 09/18/15  6:38 AM  Result Value Ref Range   Glucose-Capillary 109 (H) 65 - 99 mg/dL  Glucose, capillary     Status: None   Collection Time: 09/18/15 11:49 AM  Result Value Ref Range   Glucose-Capillary 87 65 - 99 mg/dL  Glucose, capillary     Status: Abnormal   Collection Time: 09/18/15  4:31 PM  Result Value Ref Range   Glucose-Capillary 101 (H) 65 - 99 mg/dL  Glucose, capillary      Status: Abnormal   Collection Time: 09/18/15  8:43 PM  Result Value Ref Range   Glucose-Capillary 113 (H) 65 - 99 mg/dL  Glucose, capillary     Status: Abnormal   Collection Time: 09/19/15  6:34 AM  Result Value Ref Range   Glucose-Capillary 142 (H) 65 - 99 mg/dL     General: NAD. Vital signs reviewed. HEENT: Normocephalic, atraumatic. +Assistive device for communication, status post laryngectomy Cardio: RRR and No murmur Resp: CTA B/L and On labored GI: CTA B/L and Unlabored Musc/Skel:  +TTP medial left knee. No edema.   Neuro: Alert and Oriented.  Motor: 5/5 B/l UE and RLE LLE hip flexion 4+/5 Skin:   Coban wrapping left stump  Assessment/Plan: 1. Functional deficits secondary to Left below-knee amputation which require 3+ hours per day of interdisciplinary therapy in a comprehensive inpatient rehab setting. Physiatrist is providing close team supervision and 24 hour management of active medical problems listed below. Physiatrist and rehab team continue to assess barriers to discharge/monitor patient progress toward functional and medical goals. FIM: Function - Bathing Position: Sitting EOB Body parts bathed by patient: Right arm, Left arm, Chest, Abdomen, Front perineal area, Buttocks, Right upper leg, Left upper leg, Right lower leg Body parts  bathed by helper: Back Bathing not applicable: Left lower leg Assist Level: Supervision or verbal cues  Function- Upper Body Dressing/Undressing What is the patient wearing?: Pull over shirt/dress Pull over shirt/dress - Perfomed by patient: Thread/unthread right sleeve, Thread/unthread left sleeve, Put head through opening, Pull shirt over trunk Assist Level: Set up Function - Lower Body Dressing/Undressing What is the patient wearing?: Non-skid slipper socks, Pants Position:  (Sitting at EOB and rolling in bed without bed rails) Pants- Performed by patient: Thread/unthread right pants leg, Thread/unthread left pants leg, Pull  pants up/down Pants- Performed by helper: Pull pants up/down, Thread/unthread left pants leg Non-skid slipper socks- Performed by patient: Don/doff right sock Non-skid slipper socks- Performed by helper: Don/doff right sock Assist for footwear: Maximal assist Assist for lower body dressing: Touching or steadying assistance (Pt > 75%)  Function - Toileting Toileting steps completed by patient: Adjust clothing prior to toileting, Performs perineal hygiene, Adjust clothing after toileting Toileting steps completed by helper: Adjust clothing after toileting Assist level: Supervision or verbal cues  Function - Toilet Transfers Toilet transfer assistive device: Drop arm commode, Sliding board Assist level to toilet: Supervision or verbal cues Assist level from toilet: Supervision or verbal cues     Function - Chair/bed transfer Chair/bed transfer method: Lateral scoot Chair/bed transfer assist level: Supervision or verbal cues Chair/bed transfer assistive device: Sliding board Chair/bed transfer details: Verbal cues for precautions/safety  Function - Locomotion: Wheelchair Will patient use wheelchair at discharge?: Yes Type: Manual Max wheelchair distance: 200 Assist Level: No help, No cues, assistive device, takes more than reasonable amount of time Assist Level: No help, No cues, assistive device, takes more than reasonable amount of time Assist Level: No help, No cues, assistive device, takes more than reasonable amount of time Turns around,maneuvers to table,bed, and toilet,negotiates 3% grade,maneuvers on rugs and over doorsills: No Function - Locomotion: Ambulation Ambulation activity did not occur: Safety/medical concerns Walk 10 feet activity did not occur: Safety/medical concerns Walk 50 feet with 2 turns activity did not occur: Safety/medical concerns Walk 150 feet activity did not occur: Safety/medical concerns Walk 10 feet on uneven surfaces activity did not occur:  Safety/medical concerns  Function - Comprehension Comprehension: Auditory Comprehension assist level: Follows complex conversation/direction with no assist  Function - Expression Expression: Verbal Expression assistive device: Other (Comment) Expression assist level: Expresses basic needs/ideas: With extra time/assistive device  Function - Social Interaction Social Interaction assist level: Interacts appropriately with others - No medications needed.  Function - Problem Solving Problem solving assist level: Solves basic problems with no assist  Function - Memory Memory assist level: Complete Independence: No helper Patient normally able to recall (first 3 days only): Location of own room, Staff names and faces, That he or she is in a hospital, Current season  Medical Problem List and Plan: 1.  Functional and mobility deficits secondary to left BKA on 7/19  Cont CIR 2.  DVT Prophylaxis/Anticoagulation: Pharmaceutical: Lovenox, 40 mg per day, monitor for signs of DVT 3. Chronic pain/Pain Management: Was on Suboxone tid with lyrica qid (has been refusing).  Post op on percocet, oxycodone and IV dilaudid to help with pain and baclofen tid to help manage spams.  Continue suboxone  Lyrica d/ced per pt request, changed to Gabapentin 900 TID, decreased to 600 TID on 7/25.   Dilaudid changed to Oxycodone 7/24  Robaxin '500mg'$  4/day PRN added on 7/25 4. Mood: Team to provide ego support. LCSW to follow for evaluation and support.  5. Neuropsych: This patient is capable of making decisions on her own behalf. 6. Skin/Wound Care: routine pressure relief measures.  Continue compressive dressing L-BKA for 7 days per protocol (7/27) 7. Fluids/Electrolytes/Nutrition: Monitor I/O.    8. DM type 2 with insensate neuropathy: Monitor BS ac/hs. Continue lantus insulin at bedtime with SSI for tighter control.  Titrate medications as indicated.    Relatively well controlled at present 9. COPD with SOB/OSA:  Does not use CPAP. No current respiratory issues 10 Morbid obesity: Encourage weight loss diet. 11. HTN: Monitor BP bid. Continue Norvasc daily.     Will cont to monitor 12. Candida vaginitis:  Treated with diflucan X 2 doses.  13. History of laryngeal carcinoma status post laryngectomy 14. CKD:  Cr. 1.15 on 7/22  Cont to monitor 15. Constipation:  Increased bowel reg on 7/24  LOS (Days) 4 A FACE TO FACE EVALUATION WAS PERFORMED  Duglas Heier Lorie Phenix 09/19/2015, 8:41 AM

## 2015-09-19 NOTE — Care Management Note (Signed)
[facility] Individual Statement of Services  Patient Name:  [name]  Date:  09/19/2015  Welcome to the [facility].  Our goal is to provide you with an individualized program based on your diagnosis and situation, designed to meet your specific needs.  With this comprehensive rehabilitation program, you will be expected to participate in at least 3 hours of rehabilitation therapies Monday-Friday, with modified therapy programming on the weekends.  Your rehabilitation program will include the following services:  Physical Therapy (PT), Occupational Therapy (OT), 24 hour per day rehabilitation nursing, Therapeutic Recreaction (TR), Case Management (Social Worker), Rehabilitation Medicine, Nutrition Services and Pharmacy Services  Weekly team conferences will be held on Wednesdays to discuss your progress.  Your Social Worker will talk with you frequently to get your input and to update you on team discussions.  Team conferences with you and your family in attendance may also be held.  Expected length of stay: 10-14 days  Overall anticipated outcome: Modified independent @ w/c  Depending on your progress and recovery, your program may change. Your Social Worker will coordinate services and will keep you informed of any changes. Your Social Worker's name and contact numbers are listed  below.  The following services may also be recommended but are not provided by the [facility] will be made to provide these services after discharge if needed.  Arrangements include referral to agencies that provide these services.  Your insurance has been verified to be:  Medicaid Your primary doctor is:  Juluis Mire, NP  Pertinent information will be shared with your doctor and your insurance company.  Social Worker:  [place], [place] or ([phone]   Information discussed with and copy given to patient by: Lennart Pall, 09/19/2015, 9:39 AM

## 2015-09-19 NOTE — Progress Notes (Signed)
Occupational Therapy Session Note  Patient Details  Name: [name] MRN: [id number] Date of Birth: 1955/04/12  Today's Date: 09/19/2015 OT Individual Time: 1000-1055 OT Individual Time Calculation (min): 55 min     Short Term Goals: Week 1:  OT Short Term Goal 1 (Week 1): Pt will complete tolet transfer using sliding board to drop arm commode with supervision. OT Short Term Goal 2 (Week 1): Pt will complete clothing management and hygiene with toileting with supervision. OT Short Term Goal 3 (Week 1): Pt will perform LB bathing with supervision including lateral leans side to side. OT Short Term Goal 4 (Week 1): Pt will complete simulated shower tub transfer with supervision using sliding board and tub bench.    Skilled Therapeutic Interventions/Progress Updates:   1:1 Therapeutic activity. Pt reported already bathing and dressing eariler this am. RN currently in room undressing surgical bandages and re wrapping with bandages and ace wraps. Education provided on care for residual limb and process for care of incision and importance of wrapping. Pt able to self propel with extra time to the gym with supervision. Pt able to transfer w/c to mat with supervision with long slide board and for A to place board. At [facility] worked on ability to lift self to achieve bottom clearance in prep for squat pivot transfers. initially performed with pushup blocks and then without. Able to scoot along mat laterally with bottom clearance. Discussed with PTA right hip and knee pain could use a slide board (as she can now with supervision). Pt able to perform squat pivot transfers to and from the w/c with min A with extra time. Pt verbalizes her goal is to work on sit to stands and standing for Probation officer. Pt left in room with son and call bell.  Therapy Documentation Precautions:  Precautions Precautions: Fall Required Braces or Orthoses: Knee Immobilizer - Left Knee Immobilizer - Left: On at all  times Other Brace/Splint: adjusted metal stay to lateral side of knee to decrease pressure and pain on end of L LE Restrictions Weight Bearing Restrictions: Yes LLE Weight Bearing: Non weight bearing Pain:  soreness in residual limb- RN aware in session.   See Function Navigator for Current Functional Status.   Therapy/Group: Individual Therapy  W[facility]en [facility] 09/19/2015, 12:16 PM

## 2015-09-20 ENCOUNTER — Inpatient Hospital Stay (HOSPITAL_COMMUNITY): Payer: Medicaid Other | Admitting: Physical Therapy

## 2015-09-20 ENCOUNTER — Inpatient Hospital Stay (HOSPITAL_COMMUNITY): Payer: Medicaid Other | Admitting: Occupational Therapy

## 2015-09-20 DIAGNOSIS — K5903 Drug induced constipation: Secondary | ICD-10-CM

## 2015-09-20 LAB — GLUCOSE, CAPILLARY
GLUCOSE-CAPILLARY: 105 mg/dL — AB (ref 65–99)
GLUCOSE-CAPILLARY: 89 mg/dL (ref 65–99)
GLUCOSE-CAPILLARY: 100 mg/dL — AB (ref 65–99)
GLUCOSE-CAPILLARY: 129 mg/dL — AB (ref 65–99)

## 2015-09-20 MED ORDER — BISACODYL 10 MG RE SUPP
10.0000 mg | Freq: Once | RECTAL | Status: AC
  Administered 2015-09-20: 10 mg via RECTAL
  Filled 2015-09-20: qty 1

## 2015-09-20 MED ORDER — MAGNESIUM HYDROXIDE 400 MG/5ML PO SUSP
30.0000 mL | Freq: Once | ORAL | Status: AC
  Administered 2015-09-20: 30 mL via ORAL
  Filled 2015-09-20: qty 30

## 2015-09-20 NOTE — Plan of Care (Signed)
Problem: RH BOWEL ELIMINATION Goal: RH STG MANAGE BOWEL WITH ASSISTANCE STG Manage Bowel with supervision Assistance.  Outcome: Not Progressing No BM since 7/22

## 2015-09-20 NOTE — Progress Notes (Signed)
Physical Therapy Session Note  Patient Details  Name: [name] MRN: [id number] Date of Birth: 11/10/1955  Today's Date: 09/20/2015 PT Individual Time: 1400-1428 PT Individual Time Calculation (min): 28 min    Short Term Goals: Week 1:  PT Short Term Goal 1 (Week 1): =LTGs due to ELOS  Skilled Therapeutic Interventions/Progress Updates:    Pt received resting in w/c, RN present and pt requesting to hold off on pain medication until following therapy as it makes her drowsy.  Pt agreeable to try car transfer at 26" height (simulating her van).  Pt propelled w/c to and from ortho gym mod I.  Pt performs car transfer with 26" seat height with supervision and min verbal cues for set up and safety.  Pt returned to room at end of session and positioned upright in w/c with call bell in reach and needs met.   Therapy Documentation Precautions:  Precautions Precautions: Fall Required Braces or Orthoses: Knee Immobilizer - Left Knee Immobilizer - Left: On at all times Other Brace/Splint: adjusted metal stay to lateral side of knee to decrease pressure and pain on end of L LE Restrictions Weight Bearing Restrictions: Yes LLE Weight Bearing: Non weight bearing   See Function Navigator for Current Functional Status.   Therapy/Group: Individual Therapy  Caitlin E Penven-Crew 09/20/2015, 2:33 PM  

## 2015-09-20 NOTE — Progress Notes (Signed)
Subjective/Complaints: Pt seen sitting up in bed this AM, working with OT.  She notes resolution of spasms.  Her coban was removed yesterday due to pressure along the popliteal fossa.  She complains of constipation.   Review systems: +Constipation. Denies chest pain, shortness of breath, nausea, vomiting, diarrhea.  Objective: Vital Signs: Blood pressure 138/75, pulse 65, temperature 97.9 F (36.6 C), temperature source Oral, resp. rate 19, SpO2 94 %. No results found. Results for orders placed or performed during the hospital encounter of 09/15/15 (from the past 72 hour(s))  Glucose, capillary     Status: Abnormal   Collection Time: 09/17/15 11:51 AM  Result Value Ref Range   Glucose-Capillary 117 (H) 65 - 99 mg/dL  Glucose, capillary     Status: Abnormal   Collection Time: 09/17/15  4:30 PM  Result Value Ref Range   Glucose-Capillary 160 (H) 65 - 99 mg/dL  Glucose, capillary     Status: Abnormal   Collection Time: 09/17/15  8:39 PM  Result Value Ref Range   Glucose-Capillary 107 (H) 65 - 99 mg/dL  Glucose, capillary     Status: Abnormal   Collection Time: 09/18/15  6:38 AM  Result Value Ref Range   Glucose-Capillary 109 (H) 65 - 99 mg/dL  Glucose, capillary     Status: None   Collection Time: 09/18/15 11:49 AM  Result Value Ref Range   Glucose-Capillary 87 65 - 99 mg/dL  Glucose, capillary     Status: Abnormal   Collection Time: 09/18/15  4:31 PM  Result Value Ref Range   Glucose-Capillary 101 (H) 65 - 99 mg/dL  Glucose, capillary     Status: Abnormal   Collection Time: 09/18/15  8:43 PM  Result Value Ref Range   Glucose-Capillary 113 (H) 65 - 99 mg/dL  Glucose, capillary     Status: Abnormal   Collection Time: 09/19/15  6:34 AM  Result Value Ref Range   Glucose-Capillary 142 (H) 65 - 99 mg/dL  Glucose, capillary     Status: None   Collection Time: 09/19/15 11:50 AM  Result Value Ref Range   Glucose-Capillary 84 65 - 99 mg/dL  Glucose, capillary     Status: Abnormal    Collection Time: 09/19/15  4:28 PM  Result Value Ref Range   Glucose-Capillary 102 (H) 65 - 99 mg/dL  Glucose, capillary     Status: Abnormal   Collection Time: 09/19/15  9:42 PM  Result Value Ref Range   Glucose-Capillary 109 (H) 65 - 99 mg/dL  Glucose, capillary     Status: Abnormal   Collection Time: 09/20/15  7:31 AM  Result Value Ref Range   Glucose-Capillary 105 (H) 65 - 99 mg/dL     General: NAD. Vital signs reviewed. HEENT: Normocephalic, atraumatic. +Assistive device for communication, status post laryngectomy Cardio: RRR and No murmur Resp: CTA B/L and On labored GI: CTA B/L and Unlabored Musc/Skel:  +TTP medial left knee. No edema.   Neuro: Alert and Oriented.  Motor: 5/5 B/l UE and RLE LLE hip flexion 4+/5 Skin:   Mil-Mod drainage from lateral stump incision.   Assessment/Plan: 1. Functional deficits secondary to Left below-knee amputation which require 3+ hours per day of interdisciplinary therapy in a comprehensive inpatient rehab setting. Physiatrist is providing close team supervision and 24 hour management of active medical problems listed below. Physiatrist and rehab team continue to assess barriers to discharge/monitor patient progress toward functional and medical goals. FIM: Function - Bathing Position: Sitting EOB Body parts bathed by  patient: Right arm, Left arm, Chest, Abdomen, Front perineal area, Buttocks, Right upper leg, Left upper leg, Right lower leg Body parts bathed by helper: Back Bathing not applicable: Left lower leg Assist Level: Set up, Supervision or verbal cues Set up : To obtain items  Function- Upper Body Dressing/Undressing What is the patient wearing?: Pull over shirt/dress Pull over shirt/dress - Perfomed by patient: Thread/unthread right sleeve, Thread/unthread left sleeve, Put head through opening, Pull shirt over trunk Assist Level: Set up Set up : To obtain clothing/put away Function - Lower Body Dressing/Undressing What is  the patient wearing?: Non-skid slipper socks, Pants, Shoes Position: Sitting EOB Pants- Performed by patient: Thread/unthread right pants leg, Thread/unthread left pants leg, Pull pants up/down Pants- Performed by helper: Pull pants up/down, Thread/unthread left pants leg Non-skid slipper socks- Performed by patient: Don/doff right sock Non-skid slipper socks- Performed by helper: Don/doff right sock Shoes - Performed by patient: Don/doff right shoe, Fasten right Assist for footwear: Setup, Supervision/touching assist Assist for lower body dressing: Touching or steadying assistance (Pt > 75%), Set up Set up : To obtain clothing/put away  Function - Toileting Toileting steps completed by patient: Adjust clothing prior to toileting, Performs perineal hygiene, Adjust clothing after toileting Toileting steps completed by helper: Adjust clothing after toileting Assist level: Set up/obtain supplies  Function - Toilet Transfers Toilet transfer assistive device: Drop arm commode, Sliding board Assist level to toilet: Supervision or verbal cues Assist level from toilet: Supervision or verbal cues     Function - Chair/bed transfer Chair/bed transfer method: Lateral scoot Chair/bed transfer assist level: Supervision or verbal cues Chair/bed transfer assistive device: Armrests, Sliding board Chair/bed transfer details: Verbal cues for precautions/safety  Function - Locomotion: Wheelchair Will patient use wheelchair at discharge?: Yes Type: Manual Max wheelchair distance: 150 Assist Level: No help, No cues, assistive device, takes more than reasonable amount of time Assist Level: No help, No cues, assistive device, takes more than reasonable amount of time Assist Level: No help, No cues, assistive device, takes more than reasonable amount of time Turns around,maneuvers to table,bed, and toilet,negotiates 3% grade,maneuvers on rugs and over doorsills: Yes Function - Locomotion:  Ambulation Ambulation activity did not occur: Safety/medical concerns Walk 10 feet activity did not occur: Safety/medical concerns Walk 50 feet with 2 turns activity did not occur: Safety/medical concerns Walk 150 feet activity did not occur: Safety/medical concerns Walk 10 feet on uneven surfaces activity did not occur: Safety/medical concerns  Function - Comprehension Comprehension: Auditory Comprehension assist level: Follows complex conversation/direction with no assist  Function - Expression Expression: Verbal Expression assistive device: Other (Comment) Expression assist level: Expresses basic needs/ideas: With extra time/assistive device  Function - Social Interaction Social Interaction assist level: Interacts appropriately with others - No medications needed.  Function - Problem Solving Problem solving assist level: Solves basic problems with no assist  Function - Memory Memory assist level: Complete Independence: No helper Patient normally able to recall (first 3 days only): Location of own room, Staff names and faces, That he or she is in a hospital, Current season  Medical Problem List and Plan: 1.  Functional and mobility deficits secondary to left BKA on 7/19  Cont CIR 2.  DVT Prophylaxis/Anticoagulation: Pharmaceutical: Lovenox, 40 mg per day, monitor for signs of DVT 3. Chronic pain/Pain Management: Was on Suboxone tid with lyrica qid (has been refusing).  Post op on percocet, oxycodone and IV dilaudid to help with pain and baclofen tid to help manage spams.  Continue  suboxone  Lyrica d/ced per pt request, changed to Gabapentin 900 TID, decreased to 600 TID on 7/25.   Dilaudid changed to Oxycodone 7/24  Robaxin '500mg'$  4/day PRN added on 7/25 4. Mood: Team to provide ego support. LCSW to follow for evaluation and support.   5. Neuropsych: This patient is capable of making decisions on her own behalf. 6. Skin/Wound Care: routine pressure relief measures.     Compressive dressing L-BKA d/ced on 7/25 due to pressure along popliteal fossa  Incision with mild-mod drainage along lateral side 7. Fluids/Electrolytes/Nutrition: Monitor I/O.    8. DM type 2 with insensate neuropathy: Monitor BS ac/hs. Continue lantus insulin at bedtime with SSI for tighter control.  Titrate medications as indicated.    Relatively well controlled at present 9. COPD with SOB/OSA: Does not use CPAP. No current respiratory issues 10 Morbid obesity: Encourage weight loss diet. 11. HTN: Monitor BP bid. Continue Norvasc daily.     Will cont to monitor 12. Candida vaginitis:  Treated with diflucan X 2 doses.  13. History of laryngeal carcinoma status post laryngectomy 14. CKD:  Cr. 1.15 on 7/22  Cont to monitor 15. Constipation:  Increased bowel reg on 7/24 and again on 7/26  LOS (Days) 5 A FACE TO FACE EVALUATION WAS PERFORMED  Merve Hotard Lorie Phenix 09/20/2015, 10:08 AM

## 2015-09-20 NOTE — Progress Notes (Signed)
Occupational Therapy Session Note  Patient Details  Name: GEORGANA ROMAIN MRN: [id number] Date of Birth: Makaiah 16, 1957  Today's Date: 09/20/2015 OT Individual Time: [phone] and 2683-4196 OT Individual Time Calculation (min): 85 min and 45 min    Short Term Goals: Week 1:  OT Short Term Goal 1 (Week 1): Pt will complete tolet transfer using sliding board to drop arm commode with supervision. OT Short Term Goal 2 (Week 1): Pt will complete clothing management and hygiene with toileting with supervision. OT Short Term Goal 3 (Week 1): Pt will perform LB bathing with supervision including lateral leans side to side. OT Short Term Goal 4 (Week 1): Pt will complete simulated shower tub transfer with supervision using sliding board and tub bench.    Skilled Therapeutic Interventions/Progress Updates:     Session 1: Upon entering the room, pt supine in bed with son present in room. Pt with 7/10 in L residual limb with RN notified and medication brought for pain during the session. Pt performed supine >sit with mod I. Pt performed bathing while seated on EOB with set up A to obtain items and supervision for safety. Pt returning to supine in order to wash buttocks and peri area and then don LB clothing items. OT assisted pt in donning knee immobilizer on L LE before exiting the bed.  Pt performed stand pivot transfer into wheelchair >bed with mod A for lifting assistance. Pt managed to don B leg rest for wheelchair set up and remained seated in wheelchair at end of session. Call bell and all needed items within reach upon exiting the room.   Session 2: Upon entering the room, pt seated in wheelchair with son present in room. OT demonstrating and educating pt on HEP for B UE strengthening with use of level 3 , green theraband. Pt returning demonstration with min verbal cues for proper technique. Pt performed 3 sets of 10 of chest pulls, bicep curls, shoulder diagonals, and shoulder flexion. Pt requiring rest  break secondary to fatigue. Written sheet of exercises given this session. Pt remaining in wheelchair at end of session with call bell and all needed items within reach.   Therapy Documentation Precautions:  Precautions Precautions: Fall Required Braces or Orthoses: Knee Immobilizer - Left Knee Immobilizer - Left: On at all times Other Brace/Splint: adjusted metal stay to lateral side of knee to decrease pressure and pain on end of L LE Restrictions Weight Bearing Restrictions: Yes LLE Weight Bearing: Non weight bearing Vital Signs: Therapy Vitals Temp: 97.9 F (36.6 C) Temp Source: Oral Pulse Rate: 65 Resp: 19 BP: 138/75 Patient Position (if appropriate): Lying Oxygen Therapy SpO2: 94 % O2 Device: Not Delivered Pain: Pain Assessment Pain Assessment: 0-10 Pain Score: 7  Pain Type: Acute pain Pain Location: Leg Pain Orientation: Left Pain Descriptors / Indicators: Aching Pain Onset: With Activity Patients Stated Pain Goal: 3 Pain Intervention(s): Medication (See eMAR)  See Function Navigator for Current Functional Status.   Therapy/Group: Individual Therapy  Phineas Semen 09/20/2015, 8:45 AM

## 2015-09-20 NOTE — Progress Notes (Signed)
Physical Therapy Session Note  Patient Details  Name: [name] MRN: [id number] Date of Birth: [birth year]  Today's Date: 09/20/2015 PT Individual Time: 0900-1000 PT Individual Time Calculation (min): 60 min    Short Term Goals: Week 1:  PT Short Term Goal 1 (Week 1): =LTGs due to ELOS  Skilled Therapeutic Interventions/Progress Updates:    Pt received resting in w/c with no c/o pain and agreeable to therapy session.  Session focus on BKA HEP review, manual therapy for R hip/knee OA pain, RLE strengthening, activity tolerance, and transfers.    Pt propels w/c to and from therapy gym mod I.  Pt attempts sit<>stand x2 from w/c with heavy duty RW and mod assist but unable to come to full standing 2/2 severe pain in RLE and fear of falling when transitioning UE from w/c to walker.  Slide board transfer from w/c<>therapy mat with set up assist.  PT instructed pt in sit<>stand x3 from elevated therapy mat (23.5") with RW and mod assist.  Pt able to maintain standing 2-3 minutes each trial and reports that once she is fully standing she does not have pain in RLE.    PT provided manual distraction to pt's RLE 3 sets of 30-40 seconds each for decreased pain when at rest and during functional transfers.    Pt performed BKA HEP with mod verbal cues from therapist for exercises and technique.  Pt performed 10-15 reps of glute sets, SAQ, side lying hip extension, and LAQ.    Pt returned to room at end of session and positioned upright in w/c with call bell in reach and needs met.   Therapy Documentation Precautions:  Precautions Precautions: Fall Required Braces or Orthoses: Knee Immobilizer - Left Knee Immobilizer - Left: On at all times Other Brace/Splint: adjusted metal stay to lateral side of knee to decrease pressure and pain on end of L LE Restrictions Weight Bearing Restrictions: Yes LLE Weight Bearing: Non weight bearing   See Function Navigator for Current Functional  Status.   Therapy/Group: Individual Therapy  Krisann Mckenna E Penven-Crew 09/20/2015, 10:06 AM

## 2015-09-21 ENCOUNTER — Inpatient Hospital Stay (HOSPITAL_COMMUNITY): Payer: Medicaid Other | Admitting: Physical Therapy

## 2015-09-21 ENCOUNTER — Inpatient Hospital Stay (HOSPITAL_COMMUNITY): Payer: Medicaid Other

## 2015-09-21 ENCOUNTER — Inpatient Hospital Stay (HOSPITAL_COMMUNITY): Payer: Medicaid Other | Admitting: Occupational Therapy

## 2015-09-21 DIAGNOSIS — G8918 Other acute postprocedural pain: Secondary | ICD-10-CM

## 2015-09-21 LAB — GLUCOSE, CAPILLARY
GLUCOSE-CAPILLARY: 100 mg/dL — AB (ref 65–99)
GLUCOSE-CAPILLARY: 116 mg/dL — AB (ref 65–99)
Glucose-Capillary: 92 mg/dL (ref 65–99)
Glucose-Capillary: 86 mg/dL (ref 65–99)

## 2015-09-21 MED ORDER — GABAPENTIN 600 MG PO TABS
300.0000 mg | ORAL_TABLET | Freq: Three times a day (TID) | ORAL | Status: DC
  Filled 2015-09-21 (×2): qty 1

## 2015-09-21 MED ORDER — OXYCODONE HCL 5 MG PO TABS
5.0000 mg | ORAL_TABLET | Freq: Four times a day (QID) | ORAL | Status: DC | PRN
  Administered 2015-09-21 – 2015-09-25 (×13): 10 mg via ORAL
  Administered 2015-09-25: 5 mg via ORAL
  Administered 2015-09-25 – 2015-09-26 (×4): 10 mg via ORAL
  Filled 2015-09-21 (×7): qty 2
  Filled 2015-09-21: qty 1
  Filled 2015-09-21 (×11): qty 2

## 2015-09-21 MED ORDER — TRAMADOL HCL 50 MG PO TABS
100.0000 mg | ORAL_TABLET | Freq: Four times a day (QID) | ORAL | Status: DC | PRN
  Administered 2015-09-21 – 2015-09-25 (×5): 100 mg via ORAL
  Filled 2015-09-21 (×5): qty 2

## 2015-09-21 NOTE — Progress Notes (Signed)
Subjective/Complaints: Pt sitting up in her chair working with OT.  She slept well overnight.  Spoke to pt about weaning pain medications, pt in agreement.  She also notes she had a BM x3 yesterday.   Review systems: Denies chest pain, shortness of breath, nausea, vomiting, diarrhea.  Objective: Vital Signs: Blood pressure 108/67, pulse 70, temperature 98.6 F (37 C), temperature source Oral, resp. rate 20, SpO2 93 %. No results found. Results for orders placed or performed during the hospital encounter of 09/15/15 (from the past 72 hour(s))  Glucose, capillary     Status: None   Collection Time: 09/18/15 11:49 AM  Result Value Ref Range   Glucose-Capillary 87 65 - 99 mg/dL  Glucose, capillary     Status: Abnormal   Collection Time: 09/18/15  4:31 PM  Result Value Ref Range   Glucose-Capillary 101 (H) 65 - 99 mg/dL  Glucose, capillary     Status: Abnormal   Collection Time: 09/18/15  8:43 PM  Result Value Ref Range   Glucose-Capillary 113 (H) 65 - 99 mg/dL  Glucose, capillary     Status: Abnormal   Collection Time: 09/19/15  6:34 AM  Result Value Ref Range   Glucose-Capillary 142 (H) 65 - 99 mg/dL  Glucose, capillary     Status: None   Collection Time: 09/19/15 11:50 AM  Result Value Ref Range   Glucose-Capillary 84 65 - 99 mg/dL  Glucose, capillary     Status: Abnormal   Collection Time: 09/19/15  4:28 PM  Result Value Ref Range   Glucose-Capillary 102 (H) 65 - 99 mg/dL  Glucose, capillary     Status: Abnormal   Collection Time: 09/19/15  9:42 PM  Result Value Ref Range   Glucose-Capillary 109 (H) 65 - 99 mg/dL  Glucose, capillary     Status: Abnormal   Collection Time: 09/20/15  7:31 AM  Result Value Ref Range   Glucose-Capillary 105 (H) 65 - 99 mg/dL  Glucose, capillary     Status: None   Collection Time: 09/20/15 11:40 AM  Result Value Ref Range   Glucose-Capillary 89 65 - 99 mg/dL  Glucose, capillary     Status: Abnormal   Collection Time: 09/20/15  5:15 PM   Result Value Ref Range   Glucose-Capillary 100 (H) 65 - 99 mg/dL  Glucose, capillary     Status: Abnormal   Collection Time: 09/20/15  8:28 PM  Result Value Ref Range   Glucose-Capillary 129 (H) 65 - 99 mg/dL  Glucose, capillary     Status: None   Collection Time: 09/21/15  6:41 AM  Result Value Ref Range   Glucose-Capillary 92 65 - 99 mg/dL     General: NAD. Vital signs reviewed. HEENT: Normocephalic, atraumatic. +Assistive device for communication, status post laryngectomy Cardio: RRR and No murmur Resp: CTA B/L and On labored GI: CTA B/L and Unlabored Musc/Skel:  +TTP medial left knee. No edema.   Neuro: Alert and Oriented.  Motor: 5/5 B/l UE and RLE LLE hip flexion 5/5 Skin:   Mild drainage from stump incision.   Assessment/Plan: 1. Functional deficits secondary to Left below-knee amputation which require 3+ hours per day of interdisciplinary therapy in a comprehensive inpatient rehab setting. Physiatrist is providing close team supervision and 24 hour management of active medical problems listed below. Physiatrist and rehab team continue to assess barriers to discharge/monitor patient progress toward functional and medical goals. FIM: Function - Bathing Position: Sitting EOB Body parts bathed by patient: Right arm, Left  arm, Chest, Abdomen, Front perineal area, Buttocks, Right upper leg, Left upper leg, Right lower leg Body parts bathed by helper: Back Bathing not applicable: Left lower leg Assist Level: Set up, Supervision or verbal cues Set up : To obtain items  Function- Upper Body Dressing/Undressing What is the patient wearing?: Pull over shirt/dress Pull over shirt/dress - Perfomed by patient: Thread/unthread right sleeve, Thread/unthread left sleeve, Put head through opening, Pull shirt over trunk Assist Level: Set up Set up : To obtain clothing/put away Function - Lower Body Dressing/Undressing What is the patient wearing?: Non-skid slipper socks, Pants,  Shoes Position: Sitting EOB Pants- Performed by patient: Thread/unthread right pants leg, Thread/unthread left pants leg, Pull pants up/down Pants- Performed by helper: Pull pants up/down, Thread/unthread left pants leg Non-skid slipper socks- Performed by patient: Don/doff right sock Non-skid slipper socks- Performed by helper: Don/doff right sock Shoes - Performed by patient: Don/doff right shoe, Fasten right Assist for footwear: Setup, Supervision/touching assist Assist for lower body dressing: Supervision or verbal cues, Set up Set up : To obtain clothing/put away  Function - Toileting Toileting steps completed by patient: Performs perineal hygiene Toileting steps completed by helper: Adjust clothing prior to toileting, Adjust clothing after toileting Assist level: Touching or steadying assistance (Pt.75%)  Function - Toilet Transfers Toilet transfer assistive device: Bedside commode, Drop arm commode, Sliding board Assist level to toilet: Supervision or verbal cues Assist level from toilet: Supervision or verbal cues Assist level to bedside commode (at bedside): Touching or steadying assistance (Pt > 75%) Assist level from bedside commode (at bedside): Touching or steadying assistance (Pt > 75%)     Function - Chair/bed transfer Chair/bed transfer method: Lateral scoot Chair/bed transfer assist level: Supervision or verbal cues Chair/bed transfer assistive device: Armrests, Sliding board Chair/bed transfer details: Verbal cues for precautions/safety  Function - Locomotion: Wheelchair Will patient use wheelchair at discharge?: Yes Type: Manual Max wheelchair distance: 300 Assist Level: No help, No cues, assistive device, takes more than reasonable amount of time Assist Level: No help, No cues, assistive device, takes more than reasonable amount of time Assist Level: No help, No cues, assistive device, takes more than reasonable amount of time Turns around,maneuvers to  table,bed, and toilet,negotiates 3% grade,maneuvers on rugs and over doorsills: Yes Function - Locomotion: Ambulation Ambulation activity did not occur: Safety/medical concerns Walk 10 feet activity did not occur: Safety/medical concerns Walk 50 feet with 2 turns activity did not occur: Safety/medical concerns Walk 150 feet activity did not occur: Safety/medical concerns Walk 10 feet on uneven surfaces activity did not occur: Safety/medical concerns  Function - Comprehension Comprehension: Auditory Comprehension assist level: Follows complex conversation/direction with no assist  Function - Expression Expression: Verbal Expression assistive device: Other (Comment) (artificial voice box) Expression assist level: Expresses basic needs/ideas: With extra time/assistive device  Function - Social Interaction Social Interaction assist level: Interacts appropriately with others - No medications needed.  Function - Problem Solving Problem solving assist level: Solves basic problems with no assist  Function - Memory Memory assist level: Complete Independence: No helper Patient normally able to recall (first 3 days only): Location of own room, Staff names and faces, That he or she is in a hospital, Current season  Medical Problem List and Plan: 1.  Functional and mobility deficits secondary to left BKA on 7/19  Cont CIR 2.  DVT Prophylaxis/Anticoagulation: Pharmaceutical: Lovenox, 40 mg per day, monitor for signs of DVT 3. Chronic pain/Pain Management: Was on Suboxone tid with lyrica qid (has  been refusing).  Post op on percocet, oxycodone and IV dilaudid to help with pain and baclofen tid to help manage spams.  Continue suboxone  Lyrica d/ced per pt request, changed to Gabapentin 900 TID, decreased to 600 TID on 7/25, decreased to 300 on 7/27 x3 days, then stop.   Dilaudid changed to Oxycodone 7/24, changed to q6PRN on 7/27  Robaxin '500mg'$  q6 PRN added on 7/25 4. Mood: Team to provide ego  support. LCSW to follow for evaluation and support.   5. Neuropsych: This patient is capable of making decisions on her own behalf. 6. Skin/Wound Care: routine pressure relief measures.    Compressive dressing L-BKA d/ced on 7/25 due to pressure along popliteal fossa  Incision with mild drainage, will plan for dry dressing tomorrow 7. Fluids/Electrolytes/Nutrition: Monitor I/O.    8. DM type 2 with insensate neuropathy: Monitor BS ac/hs. Continue lantus insulin at bedtime with SSI for tighter control.  Titrate medications as indicated.    Relatively well controlled at present 9. COPD with SOB/OSA: Does not use CPAP. No current respiratory issues 10 Morbid obesity: Encourage weight loss diet. 11. HTN: Monitor BP bid. Continue Norvasc daily.     Will cont to monitor 12. Candida vaginitis:  Treated with diflucan X 2 doses.  13. History of laryngeal carcinoma status post laryngectomy 14. CKD:  Cr. 1.15 on 7/22  Cont to monitor 15. Constipation:  Increased bowel reg on 7/24 and again on 7/26  LOS (Days) 6 A FACE TO FACE EVALUATION WAS PERFORMED  [name] Lorie Phenix 09/21/2015, 9:41 AM

## 2015-09-21 NOTE — Progress Notes (Signed)
Physical Therapy Note  Patient Details  Name: [name] MRN: [id number] Date of Birth: [birth year] Today's Date: 09/21/2015  1330-1430, 60 min individual tx Pain: 6/10 bil shoulders, premedicated  W/c propulsion and slide board transfer w/c> mat modified independent.   Attempted sit> stand from 23.5' height, but pt unable, stating bil UEs are sore; meds given during session. Therapeutic exercises performed with LEs and trunk to increase strength for functional mobility: 1 x 10 R long arc quad knee extension with 3# weight; seated 8 x 1 L short arc quad knee ext,R side lying 2 x 8  L hip abduction, supine glut sets, L quad set, and cervical flexion for core activation. Pt left resting in w/c with all needs within reach.    Navy Rothschild 09/21/2015, 7:54 AM

## 2015-09-21 NOTE — Progress Notes (Signed)
Occupational Therapy Note  Patient Details  Name: GILA LAUF MRN: [id number] Date of Birth: 1955/03/23  Today's Date: 09/21/2015 OT Individual Time: 1100-1158 OT Individual Time Calculation (min): 58 min    Pt c/o 5/10 pain in LLE; RN aware Individual Therapy  Pt resting in w/c upon arrival.  Pt propelled w/c to therapy gym and initially engaged in BUE therex on [employer] (10 min X 2 on random with work load of 5 in opposite directions). Pt transitioned to ADL apartment and educated on kitchen safety at w/c level.  Recommended that pt not cook without assistance/supervision.  Pt verbalized understanding and agreed with recommendation.  Pt engaged in w/c mobility in confined space and navigating through doorways.  Pt returned to room and remained in w/c with son present.    Leotis Shames [facility] 09/21/2015, 12:01 PM

## 2015-09-21 NOTE — Progress Notes (Signed)
Occupational Therapy Session Note  Patient Details  Name: [name] MRN: [id number] Date of Birth: November 16, 1955  Today's Date: 09/21/2015 OT Individual Time: 2119-4174 and 1530-1600 OT Individual Time Calculation (min): 57 min and 30 min     Short Term Goals: Week 1:  OT Short Term Goal 1 (Week 1): Pt will complete tolet transfer using sliding board to drop arm commode with supervision. OT Short Term Goal 2 (Week 1): Pt will complete clothing management and hygiene with toileting with supervision. OT Short Term Goal 3 (Week 1): Pt will perform LB bathing with supervision including lateral leans side to side. OT Short Term Goal 4 (Week 1): Pt will complete simulated shower tub transfer with supervision using sliding board and tub bench.    Skilled Therapeutic Interventions/Progress Updates:    Session 1:Upon entering the room, pt supine in bed with no c/o pain. Per Dr. Posey Pronto, knee immobilizer discontinued. Pt performed supine >sit with mod I to EOB. Pt declined bathing this session but requesting to dress from EOB. Pt performing lateral leans in order to donn elastic waist pants over B hips. Pt donning R sock and shoe with set up A. Pt needing set up A to place slide board for transfer from bed >wheelchair. Pt performed grooming tasks from wheelchair at sink this session while OT educated pt on plans for upcoming therapy sessions. MD arrived to assess pt. Call bell and all needed items within reach upon exiting the room.   Session 2: Upon entering the room, pt seated in wheelchair with no c/o pain this session. OT propelled pt via wheelchair into day room for time management. Pt navigated wheelchair on carpeted surface through obstacle course with increased time and rest breaks secondary to fatigue. Pt propelled self back to room at end of session with B UEs and L LE. Pt remained in wheelchair with son present in room and all needs within reach.   Therapy Documentation Precautions:   Precautions Precautions: Fall Required Braces or Orthoses: Knee Immobilizer - Left Knee Immobilizer - Left: On at all times Other Brace/Splint: adjusted metal stay to lateral side of knee to decrease pressure and pain on end of L LE Restrictions Weight Bearing Restrictions: Yes LLE Weight Bearing: Non weight bearing General:   Vital Signs: Therapy Vitals Temp: 98.6 F (37 C) Temp Source: Oral Pulse Rate: 68 Resp: 18 BP: 108/67 Patient Position (if appropriate): Lying Oxygen Therapy SpO2: 92 % O2 Device: Not Delivered Pain: Pain Assessment Pain Score: Asleep  See Function Navigator for Current Functional Status.   Therapy/Group: Individual Therapy  Phineas Semen 09/21/2015, 8:00 AM

## 2015-09-22 ENCOUNTER — Inpatient Hospital Stay (HOSPITAL_COMMUNITY): Payer: Medicaid Other | Admitting: Physical Therapy

## 2015-09-22 ENCOUNTER — Inpatient Hospital Stay (HOSPITAL_COMMUNITY): Payer: Medicaid Other

## 2015-09-22 ENCOUNTER — Inpatient Hospital Stay (HOSPITAL_COMMUNITY): Payer: Medicaid Other | Admitting: Occupational Therapy

## 2015-09-22 LAB — CREATININE, SERUM
CREATININE: 1.23 mg/dL — AB (ref 0.44–1.00)
GFR calc non Af Amer: 48 mL/min — ABNORMAL LOW (ref >60)
GFR, EST AFRICAN AMERICAN: 55 mL/min — AB (ref >60)

## 2015-09-22 LAB — GLUCOSE, CAPILLARY
Glucose-Capillary: 106 mg/dL — ABNORMAL HIGH (ref 65–99)
Glucose-Capillary: 129 mg/dL — ABNORMAL HIGH (ref 65–99)
Glucose-Capillary: 100 mg/dL — ABNORMAL HIGH (ref 65–99)
Glucose-Capillary: 94 mg/dL (ref 65–99)
Glucose-Capillary: 127 mg/dL — ABNORMAL HIGH (ref 65–99)

## 2015-09-22 MED ORDER — PREGABALIN 50 MG PO CAPS
50.0000 mg | ORAL_CAPSULE | Freq: Three times a day (TID) | ORAL | Status: DC
  Administered 2015-09-22 – 2015-09-26 (×14): 50 mg via ORAL
  Filled 2015-09-22 (×14): qty 1

## 2015-09-22 NOTE — Progress Notes (Signed)
Social Work Patient ID: [name], female   DOB: September 11, 1955, 60 y.o.   MRN: [id number]  Met with pt and son following team conference.  Both aware and agreeable with targeted d/c date of 8/1 and supervision w/c level goals.  No concerns.  Dorothyann Mourer, LCSW

## 2015-09-22 NOTE — Progress Notes (Signed)
Occupational Therapy Weekly Progress Note  Patient Details  Name: THANVI BLINCOE MRN: [id number] Date of Birth: Dec 24, 1955  Beginning of progress report period:September 16 2015 End of progress report period: September 22 2015  Today's Date: 09/22/2015 OT Individual Time: 7829-5621 OT Individual Time Calculation (min): 29 min    Patient has met 4 of 4 short term goals.    Patient continues to demonstrate the following deficits: balance and education regarding safe BADL completion in home with limited w/c accessibility, and therefore will continue to benefit from skilled OT intervention to enhance overall performance with BADL and iADL. Skilled OT will continue with providing pt with education regarding safe exercise completion with HEP as well as adaptive techniques/home modifications to increase safety in home while maximizing independence with ADLs/IADLs.    Patient progressing toward long term goals..  Continue plan of care.  OT Short Term Goals Week 1:  OT Short Term Goal 1 (Week 1): Pt will complete tolet transfer using sliding board to drop arm commode with supervision. OT Short Term Goal 2 (Week 1): Pt will complete clothing management and hygiene with toileting with supervision. OT Short Term Goal 3 (Week 1): Pt will perform LB bathing with supervision including lateral leans side to side. OT Short Term Goal 4 (Week 1): Pt will complete simulated shower tub transfer with supervision using sliding board and tub bench.     Skilled Therapeutic Interventions/Progress Updates:    Pt was seen this afternoon for continued transfer training with simulated home setup. Pt and OT discussed options for safe placement of BSC in room at home. BSC placements altered at bottom of bed in hospital room until safest and easiest placement was determined. Pt completed functional slideboard transfer from w/c to [facility] with supervision. Pt education provided on completing clothing mgt post transfer to prevent shearing  and skin tears with verbalized understanding. Pt was taken to tub room to complete tub bench transfer with supervision for meeting all STGs. Due to pt d/c day on Tuesday, new STGs=LTGs. Pt returned to room with son at end of session. No c/o pain. Pt reports wanting to work more on standing for duration of therapy.   Therapy Documentation Precautions: Precautions Precautions: Fall Required Braces or Orthoses: Knee Immobilizer - Left Knee Immobilizer - Left: On at all times Other Brace/Splint: adjusted metal stay to lateral side of knee to decrease pressure and pain on end of L LE Restrictions Weight Bearing Restrictions: Yes LLE Weight Bearing: Non weight bearing General:   Vital Signs: Therapy Vitals Temp: 98 F (36.7 C) Temp Source: Oral Pulse Rate: 68 Resp: 20 BP: 120/83 Patient Position (if appropriate): Lying Oxygen Therapy SpO2: 95 % O2 Device: Not Delivered Pain: No c/o pain       See Function Navigator for Current Functional Status.   Therapy/Group: Individual Therapy  Lada Fulbright A Christle Nolting 09/22/2015, 7:53 AM

## 2015-09-22 NOTE — Progress Notes (Signed)
Physical Therapy Session Note  Patient Details  Name: TIFFENY MINCHEW MRN: [id number] Date of Birth: 12-Sep-1955  Today's Date: 09/22/2015 PT Individual Time: 1533-1600 PT Individual Time Calculation (min): 27 min    Short Term Goals: Week 1:  PT Short Term Goal 1 (Week 1): =LTGs due to ELOS  Skilled Therapeutic Interventions/Progress Updates:    Pt received resting in w/c, no c/o pain but reporting some soreness in UEs and agreeable to therapy session.  Session focus on UE/back/neck stretches to relieve pain and improve overall flexibility for mobility.  PT instructed pt in 30 second reps for posterior deltoid, triceps, pecs, middle trap/rhomboid, upper trap, levator scapulae, trunk rotation and lateral trunk flexion with pt noting feeling "better" at completion.  Provided pt with handout for all stretches and educated on appropriate performance at home.  Pt verbalized understanding.  Returned to room at end of session and left upright in w/c with call bell in reach and needs met.   Therapy Documentation Precautions:  Precautions Precautions: Fall Required Braces or Orthoses: Knee Immobilizer - Left Knee Immobilizer - Left: On at all times Other Brace/Splint: adjusted metal stay to lateral side of knee to decrease pressure and pain on end of L LE Restrictions Weight Bearing Restrictions: Yes LLE Weight Bearing: Non weight bearing   See Function Navigator for Current Functional Status.   Therapy/Group: Individual Therapy  Earnest Conroy Penven-Crew 09/22/2015, 4:17 PM

## 2015-09-22 NOTE — Progress Notes (Signed)
Occupational Therapy Session Note  Patient Details  Name: [name] MRN: [id number] Date of Birth: [birth year]  Today's Date: 09/22/2015 OT Individual Time: 1300-1330 OT Individual Time Calculation (min): 30 min     Short Term Goals: Week 1:  OT Short Term Goal 1 (Week 1): Pt will complete tolet transfer using sliding board to drop arm commode with supervision. OT Short Term Goal 2 (Week 1): Pt will complete clothing management and hygiene with toileting with supervision. OT Short Term Goal 3 (Week 1): Pt will perform LB bathing with supervision including lateral leans side to side. OT Short Term Goal 4 (Week 1): Pt will complete simulated shower tub transfer with supervision using sliding board and tub bench.    Skilled Therapeutic Interventions/Progress Updates:    Treatment session with focus on functional transfers. Pt received in [place] at start of session, agreeable to therapy.   Pt self propelled to therapy gym in [facility]. Pt completed x3 squat pivot transfers WC <> mat (6 total transfers) with Mod A for lifting x1 complete transfer and Min A for weight shifting x2 complete transfers. Provided pt education on importance of flexibility with transfer completion based on environmental constraints. Provided pt education on foot placement for improved safety during transfer via improved body mechanics. Pt verbalized understanding.   Pt propelled back to room in [facility]. Left with son in room and all needs in reach.    Therapy Documentation Precautions:  Precautions Precautions: Fall Required Braces or Orthoses: Knee Immobilizer - Left Knee Immobilizer - Left: On at all times Other Brace/Splint: adjusted metal stay to lateral side of knee to decrease pressure and pain on end of L LE Restrictions Weight Bearing Restrictions: Yes LLE Weight Bearing: Non weight bearing General:   Vital Signs:   Pain: Pt with no c/o pain.  ADL:   Exercises:   Other Treatments:    See Function Navigator  for Current Functional Status.   Therapy/Group: Individual Therapy  Dierdre Searles 09/22/2015, 3:38 PM

## 2015-09-22 NOTE — Progress Notes (Signed)
Occupational Therapy Note  Patient Details  Name: [name] MRN: [id number] Date of Birth: 07-26-1955  Today's Date: 09/22/2015 OT Individual Time: [phone] OT Individual Time Calculation (min): 45 min    Pt c/o 5/10 phantom pain in LLE Individual Therapy  Pt resting in bed upon arrival.  Pt stated she had already washed up and changed clothing.  Pt performed SBT to w/c with close supervision after sliding board placement.  Pt propelled to gym, transferred to mat, and engaged in BUE therex with 3 kg weighted ball, 4# weight bar, and beach ball.  Focus on trunk rotation, BUE strengthening, and endurance to increase independence with BADLs.  Pt transferred back to w/c and propelled to room.  Pt remained in w/c with all needs within reach.   Leotis Shames [facility] 09/22/2015, 9:48 AM

## 2015-09-22 NOTE — Patient Care Conference (Signed)
Inpatient RehabilitationTeam Conference and Plan of Care Update Date: 09/20/2015   Time: 2:35 PM    Patient Name: [name]      Medical Record Number: [id number]  Date of Birth: 1955/08/11 Sex: Female         Room/Bed: 4W08C/4W08C-01 Payor Info: Payor: MEDICAID [place] / Plan: MEDICAID [place] ACCESS / Product Type: *No Product type* /    Admitting Diagnosis: BKA  Admit Date/Time:  09/15/2015  6:38 PM Admission Comments: No comment available   Primary Diagnosis:  <principal problem not specified> Principal Problem: <principal problem not specified>  Patient Active Problem List   Diagnosis Date Noted  . Post-operative pain   . Constipation due to pain medication   . Neuropathic pain   . Muscle spasm   . SIRS (systemic inflammatory response syndrome) ([place]) 09/18/2015  . Labile blood pressure   . DM type 2 with diabetic peripheral neuropathy ([place])   . Abnormality of gait   . Unilateral complete BKA ([place]) 09/15/2015  . Status post below knee amputation of left lower extremity ([place])   . Simple chronic bronchitis ([place])   . Primary osteoarthritis of right knee   . Hx of BKA   . Infection   . Sepsis ([place]) 09/09/2015  . Wound infection ([place]) 09/09/2015  . 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 Type 2 diabetes mellitus with circulatory disorder ([place])   . Osteomyelitis of foot, acute, left 08/25/2015  . Diabetic foot infection ([place]) 04/19/2015  . Diabetic foot ulcer ([place]) 04/19/2015  . CKD (chronic kidney disease) stage 3, GFR 30-59 ml/min 04/19/2015  . Head and neck cancer ([place]) 04/19/2015  . Right hip pain 04/19/2015  . Infection of supraglottis (throat) 09/16/2014  . Incidental lung nodule, less than or equal to 54m 09/16/2014  . Obstructive uropathy 03/22/2014  . COPD (chronic obstructive pulmonary disease) ([place] 03/20/2014  . Controlled type 2 diabetes mellitus with diabetic nephropathy ([place] 03/20/2014  . CAP (community acquired pneumonia)   . Lice  infested hair 040/98/1191 . Tobacco abuse 08/05/2012  . TINEA CORPORIS 09/01/2009  . DEPRESSION 08/11/2009  . COPD 08/11/2009  . Chronic pain syndrome 04/15/2007  . HYPERLIPIDEMIA NEC/NOS 11/02/2006  . PERIPHERAL VASCULAR DISEASE 11/02/2006  . MORBID OBESITY 10/28/2006  . Obstructive sleep apnea 10/28/2006  . POLYNEUROPATHY 10/28/2006  . Benign essential HTN 10/28/2006  . OSTEOARTHRITIS, KNEES, BILATERAL 10/28/2006  . PROTEINURIA 11/25/2005    Expected Discharge Date: Expected Discharge Date: 09/26/15  Team Members Present: Physician leading conference: Dr. ADelice LeschNurse Present: AHeather Roberts RN PT Present: CDwyane Dee PT OT Present: KBenay Pillow OT PPS Coordinator present : MDaiva Nakayama RN, CRRN     Current Status/Progress Goal Weekly Team Focus  Medical   Functional and mobility deficits secondary to left BKA on 7/19  Improve mobility, transfers, chronic pain, spasms  See above   Bowel/Bladder   Continent of bowel and bladder LBM 7-22   Regular bowel pattern remain continent of bowel and bladder  Assess bowel patter q shift and prn and give laxative prn   Swallow/Nutrition/ Hydration             ADL's   set up A for self care on EOB, set up A slide board transfers, mod - max A stand pivot transfer  Mod I for UB self care and simple meal prep and light house keeping from wheelchair level, supervision/set up overall goals  strengthening, family/pt edu, activity tolerance, functional mobility  Mobility   supervision/set up for slide board transfers, mod I w/c mobility   mod I overall  strengthening, HEP, activity tolerance, sit<>stands, family education for w/c bumping up 1 step   Communication             Safety/Cognition/ Behavioral Observations            Pain   Patin managed with prn pain meds  Pain managed at or below 4 with intervention  Monitor pain q shift and prn and treat as needed   Skin   Left BKA staples and sutures minimal bleeding BID  dressing changes  incision healing with no signs of infection or breakdown   Monitor incision q shift and prn     Rehab Goals Patient on target to meet rehab goals: Yes *See Care Plan and progress notes for long and short-term goals.  Barriers to Discharge: Draining BKA, chronic pain, spasms, mobility, transfers    Possible Resolutions to Barriers:  Dressing changes, optimize pain meds, therapies    Discharge Planning/Teaching Needs:  home with family able to provide 24/7 assistance  son here at all times with teaching ongoing.   Team Discussion:  Slight drainage from stump;  Adjusting pain meds.  Doing well with PT;  Slide board tfs with set up/ supervision.  Very motivated.  Supervision goals overall.  Revisions to Treatment Plan:  None   Continued Need for Acute Rehabilitation Level of Care: The patient requires daily medical management by a physician with specialized training in physical medicine and rehabilitation for the following conditions: Daily direction of a multidisciplinary physical rehabilitation program to ensure safe treatment while eliciting the highest outcome that is of practical value to the patient.: Yes Daily medical management of patient stability for increased activity during participation in an intensive rehabilitation regime.: Yes Daily analysis of laboratory values and/or radiology reports with any subsequent need for medication adjustment of medical intervention for : Post surgical problems;Wound care problems;Other  Taylin Mans 09/22/2015, 2:32 PM

## 2015-09-22 NOTE — Progress Notes (Signed)
Subjective/Complaints: Pt laying in bed this AM.  She requests to be switched to Lyrica 50 TID.  She is convinced her "jerk" are due to [place].  Her constipation has resolved.    Review systems: Denies chest pain, shortness of breath, nausea, vomiting, diarrhea.  Objective: Vital Signs: Blood pressure 120/83, pulse 68, temperature 98 F (36.7 C), temperature source Oral, resp. rate 20, SpO2 95 %. No results found. Results for orders placed or performed during the hospital encounter of 09/15/15 (from the past 72 hour(s))  Glucose, capillary     Status: None   Collection Time: 09/19/15 11:50 AM  Result Value Ref Range   Glucose-Capillary 84 65 - 99 mg/dL  Glucose, capillary     Status: Abnormal   Collection Time: 09/19/15  4:28 PM  Result Value Ref Range   Glucose-Capillary 102 (H) 65 - 99 mg/dL  Glucose, capillary     Status: Abnormal   Collection Time: 09/19/15  9:42 PM  Result Value Ref Range   Glucose-Capillary 109 (H) 65 - 99 mg/dL  Glucose, capillary     Status: Abnormal   Collection Time: 09/20/15  7:31 AM  Result Value Ref Range   Glucose-Capillary 105 (H) 65 - 99 mg/dL  Glucose, capillary     Status: None   Collection Time: 09/20/15 11:40 AM  Result Value Ref Range   Glucose-Capillary 89 65 - 99 mg/dL  Glucose, capillary     Status: Abnormal   Collection Time: 09/20/15  5:15 PM  Result Value Ref Range   Glucose-Capillary 100 (H) 65 - 99 mg/dL  Glucose, capillary     Status: Abnormal   Collection Time: 09/20/15  8:28 PM  Result Value Ref Range   Glucose-Capillary 129 (H) 65 - 99 mg/dL  Glucose, capillary     Status: None   Collection Time: 09/21/15  6:41 AM  Result Value Ref Range   Glucose-Capillary 92 65 - 99 mg/dL  Glucose, capillary     Status: None   Collection Time: 09/21/15 11:57 AM  Result Value Ref Range   Glucose-Capillary 86 65 - 99 mg/dL  Glucose, capillary     Status: Abnormal   Collection Time: 09/21/15  4:33 PM  Result Value Ref Range    Glucose-Capillary 100 (H) 65 - 99 mg/dL  Glucose, capillary     Status: Abnormal   Collection Time: 09/21/15  8:35 PM  Result Value Ref Range   Glucose-Capillary 116 (H) 65 - 99 mg/dL   Comment 1 Notify RN   Creatinine, serum     Status: Abnormal   Collection Time: 09/22/15  4:49 AM  Result Value Ref Range   Creatinine, Ser 1.23 (H) 0.44 - 1.00 mg/dL   GFR calc non Af Amer 48 (L) >60 mL/min   GFR calc Af Amer 55 (L) >60 mL/min    Comment: (NOTE) The eGFR has been calculated using the CKD EPI equation. This calculation has not been validated in all clinical situations. eGFR's persistently <60 mL/min signify possible Chronic Kidney Disease.   Glucose, capillary     Status: Abnormal   Collection Time: 09/22/15  6:42 AM  Result Value Ref Range   Glucose-Capillary 106 (H) 65 - 99 mg/dL    General: NAD. Vital signs reviewed. HEENT: Normocephalic, atraumatic. +Assistive device for communication, status post laryngectomy Cardio: RRR and No murmur Resp: CTA B/L and On labored GI: CTA B/L and Unlabored Musc/Skel:  +TTP medial left knee. No edema.   Neuro: Alert and Oriented.  Motor:  5/5 B/l UE and RLE LLE hip flexion 5/5 Skin:   Mild central drainage from stump incision.   Assessment/Plan: 1. Functional deficits secondary to Left below-knee amputation which require 3+ hours per day of interdisciplinary therapy in a comprehensive inpatient rehab setting. Physiatrist is providing close team supervision and 24 hour management of active medical problems listed below. Physiatrist and rehab team continue to assess barriers to discharge/monitor patient progress toward functional and medical goals. FIM: Function - Bathing Position: Sitting EOB Body parts bathed by patient: Right arm, Left arm, Chest, Abdomen, Front perineal area, Buttocks, Right upper leg, Left upper leg, Right lower leg Body parts bathed by helper: Back Bathing not applicable: Left lower leg Assist Level: Set up,  Supervision or verbal cues Set up : To obtain items  Function- Upper Body Dressing/Undressing What is the patient wearing?: Pull over shirt/dress Pull over shirt/dress - Perfomed by patient: Thread/unthread right sleeve, Thread/unthread left sleeve, Put head through opening, Pull shirt over trunk Assist Level: Set up Set up : To obtain clothing/put away Function - Lower Body Dressing/Undressing What is the patient wearing?: Non-skid slipper socks, Pants, Shoes Position: Sitting EOB Pants- Performed by patient: Thread/unthread right pants leg, Thread/unthread left pants leg, Pull pants up/down Pants- Performed by helper: Pull pants up/down, Thread/unthread left pants leg Non-skid slipper socks- Performed by patient: Don/doff right sock Non-skid slipper socks- Performed by helper: Don/doff right sock Shoes - Performed by patient: Don/doff right shoe, Fasten right Assist for footwear: Setup, Supervision/touching assist Assist for lower body dressing: Supervision or verbal cues, Set up Set up : To obtain clothing/put away  Function - Toileting Toileting steps completed by patient: Performs perineal hygiene Toileting steps completed by helper: Adjust clothing prior to toileting, Adjust clothing after toileting Assist level: Touching or steadying assistance (Pt.75%)  Function - Toilet Transfers Toilet transfer assistive device: Bedside commode, Drop arm commode, Sliding board Assist level to toilet: Supervision or verbal cues Assist level from toilet: Supervision or verbal cues Assist level to bedside commode (at bedside): Touching or steadying assistance (Pt > 75%) Assist level from bedside commode (at bedside): Touching or steadying assistance (Pt > 75%)     Function - Chair/bed transfer Chair/bed transfer method: Lateral scoot Chair/bed transfer assist level: No Help, no cues, assistive device, takes more than a reasonable amount of time Chair/bed transfer assistive device: Sliding  board Chair/bed transfer details: Verbal cues for precautions/safety  Function - Locomotion: Wheelchair Will patient use wheelchair at discharge?: Yes Type: Manual Max wheelchair distance: 150 Assist Level: No help, No cues, assistive device, takes more than reasonable amount of time Assist Level: No help, No cues, assistive device, takes more than reasonable amount of time Assist Level: No help, No cues, assistive device, takes more than reasonable amount of time Turns around,maneuvers to table,bed, and toilet,negotiates 3% grade,maneuvers on rugs and over doorsills: Yes Function - Locomotion: Ambulation Ambulation activity did not occur: Safety/medical concerns Walk 10 feet activity did not occur: Safety/medical concerns Walk 50 feet with 2 turns activity did not occur: Safety/medical concerns Walk 150 feet activity did not occur: Safety/medical concerns Walk 10 feet on uneven surfaces activity did not occur: Safety/medical concerns  Function - Comprehension Comprehension: Auditory Comprehension assist level: Follows complex conversation/direction with no assist  Function - Expression Expression: Verbal Expression assistive device: Other (Comment) (voice box) Expression assist level: Expresses basic needs/ideas: With extra time/assistive device  Function - Social Interaction Social Interaction assist level: Interacts appropriately with others - No medications needed.  Function - Problem Solving Problem solving assist level: Solves basic problems with no assist  Function - Memory Memory assist level: Complete Independence: No helper Patient normally able to recall (first 3 days only): Location of own room, Staff names and faces, That he or she is in a hospital, Current season  Medical Problem List and Plan: 1.  Functional and mobility deficits secondary to left BKA on 7/19  Cont CIR 2.  DVT Prophylaxis/Anticoagulation: Pharmaceutical: Lovenox, 40 mg per day, monitor for signs  of DVT 3. Chronic pain/Pain Management: Was on Suboxone tid with lyrica qid (has been refusing).  Post op on percocet, oxycodone and IV dilaudid to help with pain and baclofen tid to help manage spams.  Continue suboxone  Lyrica d/ced per pt request, changed to Gabapentin 900 TID, decreased to 600 TID on 7/25, decreased to 300 on 7/27, changed back to Lyrica 50 TID per pt request on 7/28.   Dilaudid changed to Oxycodone 7/24, changed to q6PRN on 7/27  Robaxin 540m q6 PRN added on 7/25 4. Mood: Team to provide ego support. LCSW to follow for evaluation and support.   5. Neuropsych: This patient is capable of making decisions on her own behalf. 6. Skin/Wound Care: routine pressure relief measures.    Compressive dressing L-BKA d/ced on 7/25 due to pressure along popliteal fossa  Incision with mild drainage 7. Fluids/Electrolytes/Nutrition: Monitor I/O.    8. DM type 2 with insensate neuropathy: Monitor BS ac/hs. Continue lantus insulin at bedtime with SSI for tighter control.  Titrate medications as indicated.    Well controlled at present 9. COPD with SOB/OSA: Does not use CPAP. No current respiratory issues 10 Morbid obesity: Encourage weight loss diet. 11. HTN: Monitor BP bid. Continue Norvasc daily.     Will cont to monitor  Controlled at present 12. Candida vaginitis:  Treated with diflucan X 2 doses.  13. History of laryngeal carcinoma status post laryngectomy 14. CKD:  Cr. 1.23 on 7/28  Cont to monitor 15. Constipation: Resolved  Increased bowel reg on 7/24 and again on 7/26  LOS (Days) 7 A FACE TO FACE EVALUATION WAS PERFORMED  Ankit ALorie Phenix7/28/2017, 9:20 AM

## 2015-09-22 NOTE — Progress Notes (Signed)
Physical Therapy Session Note  Patient Details  Name: LAVERGNE HILTUNEN MRN: [id number] Date of Birth: 1955/12/15  Today's Date: 09/22/2015 PT Individual Time: 1010-1105 PT Individual Time Calculation (min): 55 min    Short Term Goals:Week 1:  PT Short Term Goal 1 (Week 1): =LTGs due to ELOS  Skilled Therapeutic Interventions/Progress Updates:    Pt received resting in w/c with no c/o pain and agreeable to therapy session.  PT instructed pt's son, Evelena Peat, in slide board transfer from w/c<>bed and he returned demonstration providing appropriate supervision.  Pt propelled w/c to and from therapy gym mod I and performed slide board transfer to/from therapy mat mod I.  PT instructed pt in BKA HEP x15 reps for LAQ, glute sets, SAQ, and side lying hip extension.  Pt reporting increased soreness in abdominals, back, and groin feeling like "pulled muscles from working out."   PT instructed pt in sit<>stand from elevated surface (>25") for pain control and mod assist.  Pt performed 10 reps standing hip abduction on LLE with verbal cues for neutral LE rotation.  Slide board transfer back to w/c with mod I and pt returned to room at end of session.   Therapy Documentation Precautions:  Precautions Precautions: Fall Required Braces or Orthoses: Knee Immobilizer - Left Knee Immobilizer - Left: On at all times Other Brace/Splint: adjusted metal stay to lateral side of knee to decrease pressure and pain on end of L LE Restrictions Weight Bearing Restrictions: Yes LLE Weight Bearing: Non weight bearing   See Function Navigator for Current Functional Status.   Therapy/Group: Individual Therapy  Earnest Conroy Penven-Crew 09/22/2015, 10:43 AM

## 2015-09-23 ENCOUNTER — Inpatient Hospital Stay (HOSPITAL_COMMUNITY): Payer: Medicaid Other | Admitting: Occupational Therapy

## 2015-09-23 LAB — GLUCOSE, CAPILLARY
GLUCOSE-CAPILLARY: 114 mg/dL — AB (ref 65–99)
Glucose-Capillary: 88 mg/dL (ref 65–99)
Glucose-Capillary: 123 mg/dL — ABNORMAL HIGH (ref 65–99)
Glucose-Capillary: 99 mg/dL (ref 65–99)

## 2015-09-23 NOTE — Progress Notes (Addendum)
Occupational Therapy Session Note  Patient Details  Name: RAYSHAWN VISCONTI MRN: [id number] Date of Birth: 26-Dora-1957  Today's Date: 09/23/2015 OT Individual Time: 6433-2951 OT Individual Time Calculation (min): 61 min     Short Term Goals: Week 2:  OT Short Term Goal 1 (Week 2): STGs=LTGs due to ELOS  Skilled Therapeutic Interventions/Progress Updates:   Pt participated in morning session of ADL retraining at EOB with setup. Pt required Min A for back with plans made to provide LH sponge when in stock. Pt reported being frustrated with not having access to kitchen/bathroom sink for washing hair. Plans made to come up with alternative/adaptive method for pt to complete this meaningful portion of bathing at time of discharge. LH mirror provided with training on inspection of residual limb and unaffected LE each day for health maintenance. Pt verbalized appreciation "If I had this before this whole thing wouldn't have happened." Pt provided reacher with training on safely retrieving items w/c level in home. "Denyse Amass will like this. He's always picking things up for me at home!" Pt demonstrated appropriate use with retrieving clothing items from floor and gathering clothes for tomorrow after ADLs. Slideboard transfer completed with supervision to [facility] in simulated home setup. Toileting completed with supervision using lateral leans for hygiene and clothing mgt. Pt left in w/c at end of session with all needs within reach. No c/o pain.   Therapy Documentation Precautions:  Precautions Precautions: Fall Required Braces or Orthoses: Knee Immobilizer - Left Knee Immobilizer - Left: On at all times Other Brace/Splint: adjusted metal stay to lateral side of knee to decrease pressure and pain on end of L LE Restrictions Weight Bearing Restrictions: Yes LLE Weight Bearing: Non weight bearing General:   Vital Signs: Therapy Vitals BP: 122/67 Patient Position (if appropriate): Sitting Oxygen Therapy O2  Device: Not Delivered Pain: Pain Assessment Pain Assessment: 0-10 Pain Score: 6  Pain Type: Acute pain;Surgical pain Pain Location: Leg Pain Orientation: Left Pain Descriptors / Indicators: Aching Pain Frequency: Intermittent Pain Onset: On-going Pain Intervention(s): Medication (See eMAR)     See Function Navigator for Current Functional Status.   Therapy/Group: Individual Therapy  Kaycee Mcgaugh A Jeslie Lowe 09/23/2015, 12:53 PM

## 2015-09-23 NOTE — Progress Notes (Signed)
Subjective/Complaints: Having some spasms in leg. Some phantom pain too. meds are helping. Doesn't want to change anything at this point.  Review systems: Denies chest pain, shortness of breath, nausea, vomiting, diarrhea.  Objective: Vital Signs: Blood pressure (!) 141/88, pulse 66, temperature 98.4 F (36.9 C), temperature source Oral, resp. rate 17, SpO2 94 %. No results found. Results for orders placed or performed during the hospital encounter of 09/15/15 (from the past 72 hour(s))  Glucose, capillary     Status: None   Collection Time: 09/20/15 11:40 AM  Result Value Ref Range   Glucose-Capillary 89 65 - 99 mg/dL  Glucose, capillary     Status: Abnormal   Collection Time: 09/20/15  5:15 PM  Result Value Ref Range   Glucose-Capillary 100 (H) 65 - 99 mg/dL  Glucose, capillary     Status: Abnormal   Collection Time: 09/20/15  8:28 PM  Result Value Ref Range   Glucose-Capillary 129 (H) 65 - 99 mg/dL  Glucose, capillary     Status: None   Collection Time: 09/21/15  6:41 AM  Result Value Ref Range   Glucose-Capillary 92 65 - 99 mg/dL  Glucose, capillary     Status: None   Collection Time: 09/21/15 11:57 AM  Result Value Ref Range   Glucose-Capillary 86 65 - 99 mg/dL  Glucose, capillary     Status: Abnormal   Collection Time: 09/21/15  4:33 PM  Result Value Ref Range   Glucose-Capillary 100 (H) 65 - 99 mg/dL  Glucose, capillary     Status: Abnormal   Collection Time: 09/21/15  8:35 PM  Result Value Ref Range   Glucose-Capillary 116 (H) 65 - 99 mg/dL   Comment 1 Notify RN   Creatinine, serum     Status: Abnormal   Collection Time: 09/22/15  4:49 AM  Result Value Ref Range   Creatinine, Ser 1.23 (H) 0.44 - 1.00 mg/dL   GFR calc non Af Amer 48 (L) >60 mL/min   GFR calc Af Amer 55 (L) >60 mL/min    Comment: (NOTE) The eGFR has been calculated using the CKD EPI equation. This calculation has not been validated in all clinical situations. eGFR's persistently <60 mL/min  signify possible Chronic Kidney Disease.   Glucose, capillary     Status: Abnormal   Collection Time: 09/22/15  6:42 AM  Result Value Ref Range   Glucose-Capillary 106 (H) 65 - 99 mg/dL  Glucose, capillary     Status: Abnormal   Collection Time: 09/22/15 10:03 AM  Result Value Ref Range   Glucose-Capillary 129 (H) 65 - 99 mg/dL  Glucose, capillary     Status: Abnormal   Collection Time: 09/22/15 11:48 AM  Result Value Ref Range   Glucose-Capillary 100 (H) 65 - 99 mg/dL  Glucose, capillary     Status: None   Collection Time: 09/22/15  4:49 PM  Result Value Ref Range   Glucose-Capillary 94 65 - 99 mg/dL  Glucose, capillary     Status: Abnormal   Collection Time: 09/22/15  8:50 PM  Result Value Ref Range   Glucose-Capillary 127 (H) 65 - 99 mg/dL  Glucose, capillary     Status: None   Collection Time: 09/23/15  6:26 AM  Result Value Ref Range   Glucose-Capillary 88 65 - 99 mg/dL    General: NAD. Vital signs reviewed. HEENT: Normocephalic, atraumatic. +Assistive device for communication, status post laryngectomy Cardio: RRR and No murmur Resp: CTA B/L and On labored GI: CTA B/L and  Unlabored Musc/Skel:  +TTP medial left knee. No edema.   Neuro: Alert and Oriented.  Motor: 5/5 B/l UE and RLE LLE hip flexion 5/5 Skin:   Mild central drainage from stump incision.   Assessment/Plan: 1. Functional deficits secondary to Left below-knee amputation which require 3+ hours per day of interdisciplinary therapy in a comprehensive inpatient rehab setting. Physiatrist is providing close team supervision and 24 hour management of active medical problems listed below. Physiatrist and rehab team continue to assess barriers to discharge/monitor patient progress toward functional and medical goals. FIM: Function - Bathing Position: Sitting EOB Body parts bathed by patient: Right arm, Left arm, Chest, Abdomen, Front perineal area, Buttocks, Right upper leg, Left upper leg, Right lower leg Body  parts bathed by helper: Back Bathing not applicable: Left lower leg Assist Level: Set up, Supervision or verbal cues Set up : To obtain items  Function- Upper Body Dressing/Undressing What is the patient wearing?: Pull over shirt/dress Pull over shirt/dress - Perfomed by patient: Thread/unthread right sleeve, Thread/unthread left sleeve, Put head through opening, Pull shirt over trunk Assist Level: Set up Set up : To obtain clothing/put away Function - Lower Body Dressing/Undressing What is the patient wearing?: Non-skid slipper socks, Pants, Shoes Position: Sitting EOB Pants- Performed by patient: Thread/unthread right pants leg, Thread/unthread left pants leg, Pull pants up/down Pants- Performed by helper: Pull pants up/down, Thread/unthread left pants leg Non-skid slipper socks- Performed by patient: Don/doff right sock Non-skid slipper socks- Performed by helper: Don/doff right sock Shoes - Performed by patient: Don/doff right shoe, Fasten right Assist for footwear: Setup, Supervision/touching assist Assist for lower body dressing: Supervision or verbal cues, Set up Set up : To obtain clothing/put away  Function - Toileting Toileting steps completed by patient: Performs perineal hygiene Toileting steps completed by helper: Adjust clothing prior to toileting, Performs perineal hygiene, Adjust clothing after toileting Assist level: Touching or steadying assistance (Pt.75%)  Function - Toilet Transfers Toilet transfer assistive device: Bedside commode, Drop arm commode Assist level to toilet: Supervision or verbal cues Assist level from toilet: Supervision or verbal cues Assist level to bedside commode (at bedside): Supervision or verbal cues Assist level from bedside commode (at bedside): Supervision or verbal cues     Function - Chair/bed transfer Chair/bed transfer method: Lateral scoot Chair/bed transfer assist level: No Help, no cues, assistive device, takes more than a  reasonable amount of time Chair/bed transfer assistive device: Sliding board Chair/bed transfer details: Verbal cues for precautions/safety  Function - Locomotion: Wheelchair Will patient use wheelchair at discharge?: Yes Type: Manual Max wheelchair distance: 150 Assist Level: No help, No cues, assistive device, takes more than reasonable amount of time Assist Level: No help, No cues, assistive device, takes more than reasonable amount of time Assist Level: No help, No cues, assistive device, takes more than reasonable amount of time Turns around,maneuvers to table,bed, and toilet,negotiates 3% grade,maneuvers on rugs and over doorsills: Yes Function - Locomotion: Ambulation Ambulation activity did not occur: Safety/medical concerns Walk 10 feet activity did not occur: Safety/medical concerns Walk 50 feet with 2 turns activity did not occur: Safety/medical concerns Walk 150 feet activity did not occur: Safety/medical concerns Walk 10 feet on uneven surfaces activity did not occur: Safety/medical concerns  Function - Comprehension Comprehension: Auditory Comprehension assist level: Follows complex conversation/direction with no assist  Function - Expression Expression: Verbal Expression assistive device: Other (Comment) (Magnavox device ) Expression assist level: Expresses basic needs/ideas: With extra time/assistive device  Function - Social Interaction  Social Interaction assist level: Interacts appropriately with others - No medications needed.  Function - Problem Solving Problem solving assist level: Solves basic problems with no assist  Function - Memory Memory assist level: Complete Independence: No helper Patient normally able to recall (first 3 days only): Location of own room, Staff names and faces, That he or she is in a hospital, Current season  Medical Problem List and Plan: 1.  Functional and mobility deficits secondary to left BKA on 7/19  Cont CIR 2.  DVT  Prophylaxis/Anticoagulation: Pharmaceutical: Lovenox, 40 mg per day, monitor for signs of DVT 3. Chronic pain/Pain Management: Was on Suboxone tid with lyrica qid (has been refusing).  Post op on percocet, oxycodone and IV dilaudid to help with pain and baclofen tid to help manage spams.  Continue suboxone  Lyrica d/ced per pt request, changed to Gabapentin 900 TID, decreased to 600 TID on 7/25, decreased to 300 on 7/27, changed back to Lyrica 50 TID per pt request on 7/28.   Dilaudid changed to Oxycodone 7/24, changed to q6PRN on 7/27  Robaxin 527m q6 PRN added on 7/25 which is effective 4. Mood: Team to provide ego support. LCSW to follow for evaluation and support.   5. Neuropsych: This patient is capable of making decisions on her own behalf. 6. Skin/Wound Care: routine pressure relief measures.    -ACE/dry dressing  Incision with mild drainage 7. Fluids/Electrolytes/Nutrition: Monitor I/O.    8. DM type 2 with insensate neuropathy: Monitor BS ac/hs. Continue lantus insulin at bedtime with SSI for tighter control.  Titrate medications as indicated.    Tight control at present 9. COPD with SOB/OSA: Does not use CPAP. No current respiratory issues 10 Morbid obesity: Encourage weight loss diet. 11. HTN: Monitor BP bid. Continue Norvasc daily.     Will cont to monitor  Controlled at present 12. Candida vaginitis:  Treated with diflucan X 2 doses.  13. History of laryngeal carcinoma status post laryngectomy 14. CKD:  Cr. 1.23 on 7/28  Cont to monitor 15. Constipation: Resolved  Increased bowel reg on 7/24 and again on 7/26  LOS (Days) 8 A FACE TO FACE EVALUATION WAS PERFORMED  Nykeem Citro T 09/23/2015, 8:02 AM

## 2015-09-24 ENCOUNTER — Inpatient Hospital Stay (HOSPITAL_COMMUNITY): Payer: Medicaid Other

## 2015-09-24 ENCOUNTER — Inpatient Hospital Stay (HOSPITAL_COMMUNITY): Payer: Medicaid Other | Admitting: Physical Therapy

## 2015-09-24 ENCOUNTER — Inpatient Hospital Stay (HOSPITAL_COMMUNITY): Payer: Medicaid Other | Admitting: Occupational Therapy

## 2015-09-24 LAB — GLUCOSE, CAPILLARY
GLUCOSE-CAPILLARY: 101 mg/dL — AB (ref 65–99)
Glucose-Capillary: 85 mg/dL (ref 65–99)
Glucose-Capillary: 83 mg/dL (ref 65–99)
Glucose-Capillary: 124 mg/dL — ABNORMAL HIGH (ref 65–99)

## 2015-09-24 NOTE — Progress Notes (Signed)
PRN ultram given at 2222 and PRN Oxy IR given at 2334, for complaint of pain to left BKA. Patrici Ranks A

## 2015-09-24 NOTE — Progress Notes (Signed)
Subjective/Complaints: No new complaints. Pain seems controlled. Having occasional spasm/phantom pain but tolerable   Review systems: Denies chest pain, shortness of breath, nausea, vomiting, diarrhea.  Objective: Vital Signs: Blood pressure 109/79, pulse 63, temperature 98.2 F (36.8 C), temperature source Oral, resp. rate 17, SpO2 94 %. No results found. Results for orders placed or performed during the hospital encounter of 09/15/15 (from the past 72 hour(s))  Glucose, capillary     Status: None   Collection Time: 09/21/15 11:57 AM  Result Value Ref Range   Glucose-Capillary 86 65 - 99 mg/dL  Glucose, capillary     Status: Abnormal   Collection Time: 09/21/15  4:33 PM  Result Value Ref Range   Glucose-Capillary 100 (H) 65 - 99 mg/dL  Glucose, capillary     Status: Abnormal   Collection Time: 09/21/15  8:35 PM  Result Value Ref Range   Glucose-Capillary 116 (H) 65 - 99 mg/dL   Comment 1 Notify RN   Creatinine, serum     Status: Abnormal   Collection Time: 09/22/15  4:49 AM  Result Value Ref Range   Creatinine, Ser 1.23 (H) 0.44 - 1.00 mg/dL   GFR calc non Af Amer 48 (L) >60 mL/min   GFR calc Af Amer 55 (L) >60 mL/min    Comment: (NOTE) The eGFR has been calculated using the CKD EPI equation. This calculation has not been validated in all clinical situations. eGFR's persistently <60 mL/min signify possible Chronic Kidney Disease.   Glucose, capillary     Status: Abnormal   Collection Time: 09/22/15  6:42 AM  Result Value Ref Range   Glucose-Capillary 106 (H) 65 - 99 mg/dL  Glucose, capillary     Status: Abnormal   Collection Time: 09/22/15 10:03 AM  Result Value Ref Range   Glucose-Capillary 129 (H) 65 - 99 mg/dL  Glucose, capillary     Status: Abnormal   Collection Time: 09/22/15 11:48 AM  Result Value Ref Range   Glucose-Capillary 100 (H) 65 - 99 mg/dL  Glucose, capillary     Status: None   Collection Time: 09/22/15  4:49 PM  Result Value Ref Range   Glucose-Capillary 94 65 - 99 mg/dL  Glucose, capillary     Status: Abnormal   Collection Time: 09/22/15  8:50 PM  Result Value Ref Range   Glucose-Capillary 127 (H) 65 - 99 mg/dL  Glucose, capillary     Status: None   Collection Time: 09/23/15  6:26 AM  Result Value Ref Range   Glucose-Capillary 88 65 - 99 mg/dL  Glucose, capillary     Status: Abnormal   Collection Time: 09/23/15 11:23 AM  Result Value Ref Range   Glucose-Capillary 114 (H) 65 - 99 mg/dL  Glucose, capillary     Status: Abnormal   Collection Time: 09/23/15  4:57 PM  Result Value Ref Range   Glucose-Capillary 123 (H) 65 - 99 mg/dL  Glucose, capillary     Status: None   Collection Time: 09/23/15  8:49 PM  Result Value Ref Range   Glucose-Capillary 99 65 - 99 mg/dL  Glucose, capillary     Status: None   Collection Time: 09/24/15  6:46 AM  Result Value Ref Range   Glucose-Capillary 85 65 - 99 mg/dL    General: NAD. Vital signs reviewed. HEENT: Normocephalic, atraumatic. +Assistive device for communication, status post laryngectomy Cardio: RRR and No murmur Resp: CTA B/L and On labored GI: CTA B/L and Unlabored Musc/Skel:  +TTP medial left knee. No edema.  Neuro: Alert and Oriented.  Motor: 5/5 B/l UE and RLE LLE hip flexion 5/5 Skin:   Mild central drainage from stump incision.   Assessment/Plan: 1. Functional deficits secondary to Left below-knee amputation which require 3+ hours per day of interdisciplinary therapy in a comprehensive inpatient rehab setting. Physiatrist is providing close team supervision and 24 hour management of active medical problems listed below. Physiatrist and rehab team continue to assess barriers to discharge/monitor patient progress toward functional and medical goals. FIM: Function - Bathing Position: Sitting EOB Body parts bathed by patient: Right arm, Left arm, Chest, Abdomen, Front perineal area, Buttocks, Right upper leg, Left upper leg, Right lower leg Body parts bathed by  helper: Back Bathing not applicable: Left lower leg Assist Level: Supervision or verbal cues, Set up Set up : To obtain items  Function- Upper Body Dressing/Undressing What is the patient wearing?: Pull over shirt/dress Pull over shirt/dress - Perfomed by patient: Thread/unthread right sleeve, Thread/unthread left sleeve, Put head through opening, Pull shirt over trunk Assist Level: Set up Set up : To obtain clothing/put away Function - Lower Body Dressing/Undressing What is the patient wearing?: Non-skid slipper socks, Shoes, Pants Position: Sitting EOB Pants- Performed by patient: Thread/unthread right pants leg, Thread/unthread left pants leg, Pull pants up/down Pants- Performed by helper: Pull pants up/down, Thread/unthread left pants leg Non-skid slipper socks- Performed by patient: Don/doff right sock Non-skid slipper socks- Performed by helper: Don/doff right sock Shoes - Performed by patient: Don/doff right shoe Assist for footwear: Setup, Supervision/touching assist Assist for lower body dressing: Supervision or verbal cues Set up : To obtain clothing/put away  Function - Toileting Toileting steps completed by patient: Performs perineal hygiene Toileting steps completed by helper: Adjust clothing prior to toileting, Performs perineal hygiene, Adjust clothing after toileting Toileting Assistive Devices: Grab bar or rail Assist level: Supervision or verbal cues  Function - Toilet Transfers Toilet transfer assistive device: Bedside commode, Drop arm commode Assist level to toilet: Supervision or verbal cues Assist level from toilet: Supervision or verbal cues Assist level to bedside commode (at bedside): Supervision or verbal cues Assist level from bedside commode (at bedside): Supervision or verbal cues     Function - Chair/bed transfer Chair/bed transfer method: Lateral scoot Chair/bed transfer assist level: Supervision or verbal cues Chair/bed transfer assistive  device: Sliding board Chair/bed transfer details: Verbal cues for safe use of DME/AE  Function - Locomotion: Wheelchair Will patient use wheelchair at discharge?: Yes Type: Manual Max wheelchair distance: 150 Assist Level: No help, No cues, assistive device, takes more than reasonable amount of time Assist Level: No help, No cues, assistive device, takes more than reasonable amount of time Assist Level: No help, No cues, assistive device, takes more than reasonable amount of time Turns around,maneuvers to table,bed, and toilet,negotiates 3% grade,maneuvers on rugs and over doorsills: Yes Function - Locomotion: Ambulation Ambulation activity did not occur: Safety/medical concerns Walk 10 feet activity did not occur: Safety/medical concerns Walk 50 feet with 2 turns activity did not occur: Safety/medical concerns Walk 150 feet activity did not occur: Safety/medical concerns Walk 10 feet on uneven surfaces activity did not occur: Safety/medical concerns  Function - Comprehension Comprehension: Auditory Comprehension assist level: Follows complex conversation/direction with no assist  Function - Expression Expression: Verbal Expression assistive device: Other (Comment) (electro larynx) Expression assist level: Expresses basic needs/ideas: With extra time/assistive device  Function - Social Interaction Social Interaction assist level: Interacts appropriately with others - No medications needed.  Function - Problem Solving  Problem solving assist level: Solves basic problems with no assist  Function - Memory Memory assist level: Complete Independence: No helper Patient normally able to recall (first 3 days only): Location of own room, Staff names and faces, That he or she is in a hospital, Current season  Medical Problem List and Plan: 1.  Functional and mobility deficits secondary to left BKA on 7/19  Cont CIR 2.  DVT Prophylaxis/Anticoagulation: Pharmaceutical: Lovenox, 40 mg per  day, monitor for signs of DVT 3. Chronic pain/Pain Management: Was on Suboxone tid with lyrica qid (has been refusing).  Post op on percocet, oxycodone and IV dilaudid to help with pain and baclofen tid to help manage spams.  Continue suboxone  Lyrica d/ced per pt request, changed to Gabapentin 900 TID, decreased to 600 TID on 7/25, decreased to 300 on 7/27, changed back to Lyrica 50 TID per pt request on 7/28.   Dilaudid changed to Oxycodone 7/24, changed to q6PRN on 7/27  Robaxin 557m q6 PRN added on 7/25 which is effective 4. Mood: Team to provide ego support. LCSW to follow for evaluation and support.   5. Neuropsych: This patient is capable of making decisions on her own behalf. 6. Skin/Wound Care: routine pressure relief measures.    -ACE/dry dressing  Incision with decreasing drainage 7. Fluids/Electrolytes/Nutrition: Monitor I/O.    8. DM type 2 with insensate neuropathy: Monitor BS ac/hs. Continue lantus insulin at bedtime with SSI for tighter control.  Titrate medications as indicated.    Tight control at present 9. COPD with SOB/OSA: Does not use CPAP. No current respiratory issues 10 Morbid obesity: Encourage weight loss diet. 11. HTN: Monitor BP bid. Continue Norvasc daily.     Will cont to monitor  Controlled at present 12. Candida vaginitis:  Treated with diflucan X 2 doses.  13. History of laryngeal carcinoma status post laryngectomy 14. CKD:  Cr. 1.23 on 7/28  Cont to monitor 15. Constipation: Resolved  Increased bowel reg on 7/24 and again on 7/26  LOS (Days) 9 A FACE TO FACE EVALUATION WAS PERFORMED  Willey Due T 09/24/2015, 7:50 AM

## 2015-09-24 NOTE — Progress Notes (Signed)
Occupational Therapy Note  Patient Details  Name: [name] MRN: [id number] Date of Birth: 1955-11-17  Today's Date: 09/24/2015 OT Individual Time: 1510-1540 OT Individual Time Calculation (min): 30 min    Pt c/o 5/10 pain in LLE and right shoulder; premedicated Individual Therapy  Pt resting in w/c upon arrival.  Pt propelled w/c to therapy gym and initially engaged in w/c mobility activities before BUE therex on [employer] (10 mins X 2 in opposite directions - workload 3). Pt returned to room and remained in w/c with son present and all needs within reach.   Leotis Shames [facility] 09/24/2015, 3:52 PM

## 2015-09-24 NOTE — Progress Notes (Signed)
Physical Therapy Session Note  Patient Details  Name: [name] MRN: [id number] Date of Birth: 31-Dec-1955  Today's Date: 09/24/2015 PT Individual Time: 1103-1200 PT Individual Time Calculation (min): 57 min    Short Term Goals: Week 1:  PT Short Term Goal 1 (Week 1): =LTGs due to ELOS  Skilled Therapeutic Interventions/Progress Updates:    Pt received in w/c & agreeable to PT, noting 4/10 pain in LLE & R shoulder (chronic pain) but pt premedicated. Session focused on w/c mobility with pt propelling off of unit & outside. Pt able to negotiate over uneven concrete, ramps, and elevators with supervision/mod I. Therapist provided cuing for pt to back onto elevator & pt able to return demonstrate. Pt required rest breaks 2/2 BUE & overall fatigue. Back on unit pt performed BUE therapeutic exercises (chest pull, shoulder flexion, bicep curls) all with green theraband with therapist providing cues for hand placement on theraband to adjust resistance of exercise. Pt also performed cervical side bend and upper trap stretch. Educated pt on importance of stretching to prevent muscle tightness in upper body.  Pt propelled w/c back to room & at end of session pt left sitting in w/c in room with all needs within reach & son present.  Therapy Documentation Precautions:  Precautions Precautions: Fall Required Braces or Orthoses: Knee Immobilizer - Left Knee Immobilizer - Left: On at all times Other Brace/Splint: adjusted metal stay to lateral side of knee to decrease pressure and pain on end of L LE Restrictions Weight Bearing Restrictions: Yes LLE Weight Bearing: Non weight bearing  Pain: Pain Assessment Pain Assessment: 0-10 Pain Score: 4  Pain Location: Leg (& R shoulder) Pain Orientation: Left Pain Intervention(s):  (premedicated)   See Function Navigator for Current Functional Status.   Therapy/Group: Individual Therapy  Waunita Schooner 09/24/2015, 12:04 PM

## 2015-09-24 NOTE — Progress Notes (Signed)
Occupational Therapy Session Note  Patient Details  Name: [name] MRN: [id number] Date of Birth: 11-Nov-1955  Today's Date: 09/24/2015 OT Individual Time: 1256-1402 OT Individual Time Calculation (min): 66 min    Skilled Therapeutic Interventions/Progress Updates:   Pt seen this afternoon for caregiver training in regards to washing hair bedlevel and wrapping residual limb. Pt was provided education on adaptive technique for washing hair with use of home items while lying sideways in bed. Pts son was provided education on proper technique for carryover at home post discharge with active involvement in hair washing with skilled OT. Pt and son reported that they can wash her hair at home with this method. Afterwards, pt was inquired regarding d/c. Pt reported not knowing how to wrap residual limb. Pt and son were provided education on proper technique for limb wrapping with hands on completion. Both son and pt completed limb wrapping in figure 8 technique with supervision.  Further education provided regarding LH cleaning supplies for sweeping floors and cleaning home with verbalized understanding. Plans made with pt and son to have snacks available in bedroom during day due to lack of kitchen access. Solutions completed in regards to pet care for pt to continue meaningful life role after discharge. Social work contacted regarding DME needs and concerns. Pt left with son in room and all needs within reach.   Therapy Documentation Precautions:  Precautions Precautions: Fall Required Braces or Orthoses: Knee Immobilizer - Left Knee Immobilizer - Left: On at all times Other Brace/Splint: adjusted metal stay to lateral side of knee to decrease pressure and pain on end of L LE Restrictions Weight Bearing Restrictions: Yes LLE Weight Bearing: Non weight bearing  Therapy Vitals Temp: 98.2 F (36.8 C) Temp Source: Oral Pulse Rate: 73 Resp: 18 BP: 129/68 Patient Position (if appropriate):  Sitting Oxygen Therapy SpO2: 98 % O2 Device: Not Delivered Pain: No c/o pain  Pain Assessment Pain Assessment: 0-10 Pain Score: 4  Pain Type: Acute pain;Neuropathic pain Pain Location: Leg Pain Orientation: Left Pain Descriptors / Indicators: Aching Pain Frequency: Constant Pain Onset: On-going Pain Intervention(s): Medication (See eMAR) ADL:   Exercises:   Other Treatments:    See Function Navigator for Current Functional Status.   Therapy/Group: Individual Therapy  Teyanna Thielman A Nalaya Wojdyla 09/24/2015, 4:13 PM

## 2015-09-25 ENCOUNTER — Inpatient Hospital Stay (HOSPITAL_COMMUNITY): Payer: Medicaid Other | Admitting: Physical Therapy

## 2015-09-25 ENCOUNTER — Inpatient Hospital Stay (HOSPITAL_COMMUNITY): Payer: Medicaid Other | Admitting: Occupational Therapy

## 2015-09-25 DIAGNOSIS — N183 Chronic kidney disease, stage 3 (moderate): Secondary | ICD-10-CM

## 2015-09-25 LAB — GLUCOSE, CAPILLARY
GLUCOSE-CAPILLARY: 92 mg/dL (ref 65–99)
GLUCOSE-CAPILLARY: 88 mg/dL (ref 65–99)
Glucose-Capillary: 84 mg/dL (ref 65–99)
Glucose-Capillary: 164 mg/dL — ABNORMAL HIGH (ref 65–99)

## 2015-09-25 LAB — CBC WITH DIFFERENTIAL/PLATELET
Basophils Absolute: 0.1 10*3/uL (ref 0.0–0.1)
Basophils Relative: 1 %
Eosinophils Absolute: 0.3 10*3/uL (ref 0.0–0.7)
Eosinophils Relative: 4 %
HEMATOCRIT: 40.3 % (ref 36.0–46.0)
HEMOGLOBIN: 12.5 g/dL (ref 12.0–15.0)
LYMPHS ABS: 2.1 10*3/uL (ref 0.7–4.0)
LYMPHS PCT: 33 %
MCH: 26.7 pg (ref 26.0–34.0)
MCHC: 31 g/dL (ref 30.0–36.0)
MCV: 86.1 fL (ref 78.0–100.0)
MONOS PCT: 2 %
Monocytes Absolute: 0.1 10*3/uL (ref 0.1–1.0)
NEUTROS ABS: 3.7 10*3/uL (ref 1.7–7.7)
NEUTROS PCT: 60 %
Platelets: 284 10*3/uL (ref 150–400)
RBC: 4.68 MIL/uL (ref 3.87–5.11)
RDW: 14.2 % (ref 11.5–15.5)
WBC: 6.2 10*3/uL (ref 4.0–10.5)

## 2015-09-25 LAB — BASIC METABOLIC PANEL
Anion gap: 6 (ref 5–15)
BUN: 23 mg/dL — ABNORMAL HIGH (ref 6–20)
CHLORIDE: 106 mmol/L (ref 101–111)
CO2: 27 mmol/L (ref 22–32)
CREATININE: 1.17 mg/dL — AB (ref 0.44–1.00)
Calcium: 9.2 mg/dL (ref 8.9–10.3)
GFR calc non Af Amer: 51 mL/min — ABNORMAL LOW (ref >60)
GFR, EST AFRICAN AMERICAN: 59 mL/min — AB (ref >60)
Glucose, Bld: 124 mg/dL — ABNORMAL HIGH (ref 65–99)
Potassium: 4.1 mmol/L (ref 3.5–5.1)
Sodium: 139 mmol/L (ref 135–145)

## 2015-09-25 NOTE — Progress Notes (Signed)
Subjective/Complaints: Pt sitting up in bed this AM.  She had a very goot weekend and is looking forward to discharge soon. She notes improvement in spasms.   Review systems: Denies chest pain, shortness of breath, nausea, vomiting, diarrhea.  Objective: Vital Signs: Blood pressure 129/75, pulse 65, temperature 98.2 F (36.8 C), temperature source Oral, resp. rate 16, SpO2 98 %. No results found. Results for orders placed or performed during the hospital encounter of 09/15/15 (from the past 72 hour(s))  Glucose, capillary     Status: Abnormal   Collection Time: 09/22/15 10:03 AM  Result Value Ref Range   Glucose-Capillary 129 (H) 65 - 99 mg/dL  Glucose, capillary     Status: Abnormal   Collection Time: 09/22/15 11:48 AM  Result Value Ref Range   Glucose-Capillary 100 (H) 65 - 99 mg/dL  Glucose, capillary     Status: None   Collection Time: 09/22/15  4:49 PM  Result Value Ref Range   Glucose-Capillary 94 65 - 99 mg/dL  Glucose, capillary     Status: Abnormal   Collection Time: 09/22/15  8:50 PM  Result Value Ref Range   Glucose-Capillary 127 (H) 65 - 99 mg/dL  Glucose, capillary     Status: None   Collection Time: 09/23/15  6:26 AM  Result Value Ref Range   Glucose-Capillary 88 65 - 99 mg/dL  Glucose, capillary     Status: Abnormal   Collection Time: 09/23/15 11:23 AM  Result Value Ref Range   Glucose-Capillary 114 (H) 65 - 99 mg/dL  Glucose, capillary     Status: Abnormal   Collection Time: 09/23/15  4:57 PM  Result Value Ref Range   Glucose-Capillary 123 (H) 65 - 99 mg/dL  Glucose, capillary     Status: None   Collection Time: 09/23/15  8:49 PM  Result Value Ref Range   Glucose-Capillary 99 65 - 99 mg/dL  Glucose, capillary     Status: None   Collection Time: 09/24/15  6:46 AM  Result Value Ref Range   Glucose-Capillary 85 65 - 99 mg/dL  Glucose, capillary     Status: None   Collection Time: 09/24/15 12:06 PM  Result Value Ref Range   Glucose-Capillary 83 65 - 99  mg/dL  Glucose, capillary     Status: Abnormal   Collection Time: 09/24/15  4:44 PM  Result Value Ref Range   Glucose-Capillary 124 (H) 65 - 99 mg/dL  Glucose, capillary     Status: Abnormal   Collection Time: 09/24/15  8:14 PM  Result Value Ref Range   Glucose-Capillary 101 (H) 65 - 99 mg/dL  Glucose, capillary     Status: None   Collection Time: 09/25/15  6:48 AM  Result Value Ref Range   Glucose-Capillary 84 65 - 99 mg/dL    General: NAD. Vital signs reviewed. HEENT: Normocephalic, atraumatic. +Assistive device for communication, status post laryngectomy Cardio: RRR and No murmur Resp: CTA B/L and On labored GI: CTA B/L and Unlabored Musc/Skel:  +TTP medial left knee (improving). No edema.   Neuro: Alert and Oriented.  Motor: 5/5 B/l UE and RLE LLE hip flexion 5/5 Skin:   Very minimal drainage from stump incision.   Assessment/Plan: 1. Functional deficits secondary to Left below-knee amputation which require 3+ hours per day of interdisciplinary therapy in a comprehensive inpatient rehab setting. Physiatrist is providing close team supervision and 24 hour management of active medical problems listed below. Physiatrist and rehab team continue to assess barriers to discharge/monitor patient progress  toward functional and medical goals. FIM: Function - Bathing Position: Sitting EOB Body parts bathed by patient: Right arm, Left arm, Chest, Abdomen, Front perineal area, Buttocks, Right upper leg, Left upper leg, Right lower leg Body parts bathed by helper: Back Bathing not applicable: Left lower leg Assist Level: Supervision or verbal cues, Set up Set up : To obtain items  Function- Upper Body Dressing/Undressing What is the patient wearing?: Pull over shirt/dress Pull over shirt/dress - Perfomed by patient: Thread/unthread right sleeve, Thread/unthread left sleeve, Put head through opening, Pull shirt over trunk Assist Level: Set up Set up : To obtain clothing/put  away Function - Lower Body Dressing/Undressing What is the patient wearing?: Non-skid slipper socks, Shoes, Pants Position: Sitting EOB Pants- Performed by patient: Thread/unthread right pants leg, Thread/unthread left pants leg, Pull pants up/down Pants- Performed by helper: Pull pants up/down, Thread/unthread left pants leg Non-skid slipper socks- Performed by patient: Don/doff right sock Non-skid slipper socks- Performed by helper: Don/doff right sock Shoes - Performed by patient: Don/doff right shoe Assist for footwear: Setup, Supervision/touching assist Assist for lower body dressing: Supervision or verbal cues Set up : To obtain clothing/put away  Function - Toileting Toileting steps completed by patient: Adjust clothing prior to toileting, Performs perineal hygiene, Adjust clothing after toileting Toileting steps completed by helper: Adjust clothing prior to toileting, Adjust clothing after toileting Toileting Assistive Devices: Grab bar or rail Assist level: Supervision or verbal cues  Function - Toilet Transfers Toilet transfer assistive device: Bedside commode, Drop arm commode Assist level to toilet: Supervision or verbal cues Assist level from toilet: Supervision or verbal cues Assist level to bedside commode (at bedside): Supervision or verbal cues Assist level from bedside commode (at bedside): Supervision or verbal cues     Function - Chair/bed transfer Chair/bed transfer method: Lateral scoot Chair/bed transfer assist level: Supervision or verbal cues Chair/bed transfer assistive device: Sliding board Chair/bed transfer details: Verbal cues for safe use of DME/AE  Function - Locomotion: Wheelchair Will patient use wheelchair at discharge?: Yes Type: Manual Max wheelchair distance: >300 ft Assist Level: Supervision or verbal cues Assist Level: Supervision or verbal cues Assist Level: Supervision or verbal cues Turns around,maneuvers to table,bed, and  toilet,negotiates 3% grade,maneuvers on rugs and over doorsills: Yes Function - Locomotion: Ambulation Ambulation activity did not occur: Safety/medical concerns Walk 10 feet activity did not occur: Safety/medical concerns Walk 50 feet with 2 turns activity did not occur: Safety/medical concerns Walk 150 feet activity did not occur: Safety/medical concerns Walk 10 feet on uneven surfaces activity did not occur: Safety/medical concerns  Function - Comprehension Comprehension: Auditory Comprehension assist level: Follows complex conversation/direction with no assist  Function - Expression Expression: Verbal Expression assistive device: Other (Comment) (voice box) Expression assist level: Expresses basic needs/ideas: With extra time/assistive device  Function - Social Interaction Social Interaction assist level: Interacts appropriately with others - No medications needed.  Function - Problem Solving Problem solving assist level: Solves basic problems with no assist  Function - Memory Memory assist level: Complete Independence: No helper Patient normally able to recall (first 3 days only): Location of own room, Staff names and faces, That he or she is in a hospital, Current season  Medical Problem List and Plan: 1.  Functional and mobility deficits secondary to left BKA on 7/19  Cont CIR 2.  DVT Prophylaxis/Anticoagulation: Pharmaceutical: Lovenox, 40 mg per day, monitor for signs of DVT 3. Chronic pain/Pain Management: Was on Suboxone tid with lyrica qid (has been refusing).  Post op on percocet, oxycodone and IV dilaudid to help with pain and baclofen tid to help manage spams.  Continue suboxone  Lyrica d/ced per pt request, changed to Gabapentin 900 TID, decreased to 600 TID on 7/25, decreased to 300 on 7/27, changed back to Lyrica 50 TID per pt request on 7/28.   Dilaudid changed to Oxycodone 7/24, changed to q6PRN on 7/27  Robaxin '500mg'$  q6 PRN added on 7/25 which is effective 4.  Mood: Team to provide ego support. LCSW to follow for evaluation and support.   5. Neuropsych: This patient is capable of making decisions on her own behalf. 6. Skin/Wound Care: routine pressure relief measures.    ACE/dry dressing 7. Fluids/Electrolytes/Nutrition: Monitor I/O.    8. DM type 2 with insensate neuropathy: Monitor BS ac/hs. Continue lantus insulin at bedtime with SSI for tighter control.  Titrate medications as indicated.    Tight control at present 9. COPD with SOB/OSA: Does not use CPAP. No current respiratory issues 10 Morbid obesity: Encourage weight loss diet. 11. HTN: Monitor BP bid. Continue Norvasc daily.     Will cont to monitor  Controlled at present 12. Candida vaginitis:  Treated with diflucan X 2 doses.  13. History of laryngeal carcinoma status post laryngectomy 14. CKD:  Cr. 1.23 on 7/28  Cont to monitor  Labs pending for today 15. Constipation: Resolved  Increased bowel reg on 7/24 and again on 7/26  LOS (Days) 10 A FACE TO FACE EVALUATION WAS PERFORMED  Ankit Lorie Phenix 09/25/2015, 9:58 AM

## 2015-09-25 NOTE — Progress Notes (Signed)
Occupational Therapy Discharge Summary  Patient Details  Name: [name] MRN: [id number] Date of Birth: [birth year]  Today's Date: 09/25/2015 OT Individual Time:  - 0900-0958 (33 minutes) and  1416-1446 (30 minutes)       Patient has met 9 out of 9  long term goals due to improved activity tolerance, improved balance, postural control and improved awareness.  Patient to discharge at overall Modified Independent level.  Patient's care partner is independent to provide the necessary physical assistance at discharge.    Reasons goals not met:   Recommendation:  Patient will benefit from ongoing skilled OT services in home health setting to continue to advance functional skills in the area of iADL.  Equipment: Dance movement psychotherapist   Reasons for discharge: treatment goals met  Patient/family agrees with progress made and goals achieved: Yes   Skilled OT Intervention:  Pt was sitting at EOB completing trach care during skilled OT arrival. Pt completed at Mod I level and able to say steps aloud to therapist. Pt was provided education on grad day, and was agreeable to complete BADLs. Pt completed slideboard transfer with Mod I and gathered ADL items w/c level. Pt returned to EOB and completed bathing with supervision for setup and UB/LB dressing with Mod I. Pt demonstrated appropriate use of AE including LH mirror for LE skin inspection. Pt demonstrated increased safety awareness with proper positioning of w/c, slideboard, and utilizing adaptive ADL techniques at EOB. Social work notified in regards to pt request for transportation resources in [place]. Pt wrapped residual limb with supervision and min vcs. Pt left up in w/c with son at time of departure. No c/o pain today.   2nd Session 1:1 Tx (30 minutes) Pt seen in afternoon session for IADL retraining. Pt self propelled to laundry room and simulated laundry completion with use of reacher with instruction on adaptive  technique. Pt then completed simple hot meal prep in kitchen with use of microwave. Pt was educated on always using oven mits for retrieving hot items from microwave due to neuropathy. Pt verbalized understanding and that son and husband would set up area in her room for simple meal prep. Back in room, Pt completed wrapping of residual limb with min cuing. DME needs discussed with pt verbalizing that she had no further questions. Pt educated on goal achievement with verbalized feelings of accomplishment.  At end of session pt left in w/c with son present.   OT Discharge Precautions/Restrictions Precautions Precautions: Fall Required Braces or Orthoses:  (None) Restrictions Weight Bearing Restrictions: Yes LLE Weight Bearing: Non weight bearing General   Vital Signs Therapy Vitals Pulse Rate: 65 Resp: 16 Oxygen Therapy SpO2: 98 % O2 Device: Not Delivered Pain No c/o pain  Pain Assessment Pain Assessment: No/denies pain Pain Score: 0-No pain ADL ADL Equipment Provided: Reacher, Sock aid, Long-handled sponge Eating: Modified independent Where Assessed-Eating: Chair Grooming: Modified independent Where Assessed-Grooming: Sitting at sink Upper Body Bathing: Supervision/safety Where Assessed-Upper Body Bathing: Edge of bed Lower Body Bathing: Supervision/safety Where Assessed-Lower Body Bathing: Edge of bed Upper Body Dressing: Modified independent (Device) Where Assessed-Upper Body Dressing: Edge of bed Lower Body Dressing: Modified independent Where Assessed-Lower Body Dressing: Edge of bed Toileting: Supervision/safety Where Assessed-Toileting: Bedside Commode Toilet Transfer: Distant supervision Tub/Shower Transfer: Close supervison Tub/Shower Transfer Method: Administrator, arts: Transfer tub bench Vision/Perception  Vision- History Baseline Vision/History: Wears glasses Wears Glasses: Reading only Patient Visual Report: No change from baseline Vision-  Assessment Vision Assessment?: No  apparent visual deficits  Cognition Overall Cognitive Status: Within Functional Limits for tasks assessed Arousal/Alertness: Awake/alert Orientation Level: Oriented X4 Attention: Sustained;Focused Focused Attention: Appears intact Sustained Attention: Appears intact Selective Attention: Appears intact Memory: Appears intact Awareness: Appears intact Problem Solving: Appears intact Safety/Judgment: Appears intact Sensation Sensation Light Touch: Impaired Detail Light Touch Impaired Details: Impaired RLE;Impaired LLE;Impaired LUE Stereognosis: Not tested Hot/Cold: Not tested Proprioception: Appears Intact Coordination Gross Motor Movements are Fluid and Coordinated: Yes Fine Motor Movements are Fluid and Coordinated: Yes Motor  Motor Motor: Within Functional Limits Motor - Skilled Clinical Observations: Endurance and UB strength WFL for completion of BADLs at Mod I-supervision level  Mobility     Trunk/Postural Assessment  Cervical Assessment Cervical Assessment: Within Functional Limits Thoracic Assessment Thoracic Assessment: Within Functional Limits Lumbar Assessment Lumbar Assessment: Within Functional Limits Postural Control Postural Control: Within Functional Limits  Balance Balance Balance Assessed: Yes Static Sitting Balance Static Sitting - Level of Assistance: 6: Modified independent (Device/Increase time) Dynamic Sitting Balance Dynamic Sitting - Level of Assistance: 6: Modified independent (Device/Increase time) Extremity/Trunk Assessment RUE Assessment RUE Assessment: Within Functional Limits LUE Assessment LUE Assessment: Within Functional Limits   See Function Navigator for Current Functional Status.  Acire Tang A Shaqueena Mauceri 09/25/2015, 9:37 AM

## 2015-09-25 NOTE — Progress Notes (Signed)
Physical Therapy Discharge Summary  Patient Details  Name: [name] MRN: [id number] Date of Birth: October 13, 1955  Today's Date: 09/25/2015 PT Individual Time: 1004-1100 and 1445-1530 PT Individual Time Calculation (min): 56 min and 45 min     Patient has met 10 of 10 long term goals due to improved activity tolerance, improved balance, improved postural control, increased strength and decreased pain.  Patient to discharge at a wheelchair level Modified Independent.   Patient's care partner is independent to provide the necessary assist for w/c bumping for home entry at d/c.    Recommendation:  Patient will benefit from ongoing skilled PT services in home health setting to continue to advance safe functional mobility, address ongoing impairments in strength, pain, flexibility, and balance, and minimize fall risk.  Equipment: L amputee pad  Reasons for discharge: treatment goals met  Patient/family agrees with progress made and goals achieved: Yes   Skilled PT Intervention: Session 1: Pt received resting in w/c with c/o 4/10 pain and agreeable to therapy session.  Session focus on review of all transfers with slide board, w/c set up, discussion about home set up, and pt education.  Pt performs w/c mobility mod I and is able to manage w/c parts mod I.  Pt propels w/c throughout unit, max distance ~300' mod I.  Pt performs car transfer at 26" seat height with slide board and supervision with min cues for foot placement.   Pt performs slide board transfer w/c<>low soft couch with supervision, and w/c<>bed with mod I.  Supine<>sit mod I.  Pt returned to room at end of session and left upright in w/c with call bell in reach and needs met.   Session 2: Pt received resting in w/c with no c/o pain and agreeable to therapy session.  Session focus on review of BKA HEP and patient education.  Pt performs w/c mobility and transfers with slide board with mod I.  Pt performed HEP with min verbal cues from  therapist.  PT provided education regarding positioning and performing exercises with proper technique for maximum benefit.  Pt verbalized understanding.  Pt returned to room in w/c at end of session and positioned with call bell in reach and needs met.   PT Discharge Precautions/RestrictionsPrecautions Precautions: Fall Required Braces or Orthoses:  (None) Knee Immobilizer - Left: Other (comment) Other Brace/Splint: discontinued  Restrictions Weight Bearing Restrictions: Yes LLE Weight Bearing: Weight bearing as tolerated Pain Pain Assessment Pain Assessment: 0-10 Pain Score: 4  Pain Intervention(s): Emotional support  Cognition Overall Cognitive Status: Within Functional Limits for tasks assessed Arousal/Alertness: Awake/alert Orientation Level: Oriented X4 (stated date was August 2) Attention: Sustained;Focused Focused Attention: Appears intact Sustained Attention: Appears intact Selective Attention: Appears intact Memory: Appears intact Awareness: Appears intact Problem Solving: Appears intact Safety/Judgment: Appears intact Sensation Sensation Light Touch: Impaired Detail Light Touch Impaired Details: Impaired LLE Stereognosis: Not tested Hot/Cold: Not tested Proprioception: Appears Intact Additional Comments: decreased sensation to LT on LLE L3-4 dermatome Coordination Gross Motor Movements are Fluid and Coordinated: Yes Fine Motor Movements are Fluid and Coordinated: Yes Motor  Motor Motor: Within Functional Limits Motor - Skilled Clinical Observations: Endurance and UB strength WFL for completion of BADLs at Mod I-supervision level   Mobility Bed Mobility Bed Mobility: Rolling Right;Rolling Left;Supine to Sit;Sit to Supine Rolling Right: 6: Modified independent (Device/Increase time) Rolling Left: 6: Modified independent (Device/Increase time) Supine to Sit: 6: Modified independent (Device/Increase time) Sit to Supine: 6: Modified independent (Device/Increase  time) Transfers Transfers: Yes  Lateral/Scoot Transfers: 6: Modified independent (Device/Increase time);With slide board Locomotion  Ambulation Ambulation: No Gait Gait: No Stairs / Additional Locomotion Stairs: No Wheelchair Mobility Wheelchair Mobility: Yes Wheelchair Assistance: 6: Modified independent (Device/Increase time) Environmental health practitioner: Both upper extremities Wheelchair Parts Management: Independent Distance: 300  Trunk/Postural Assessment  Cervical Assessment Cervical Assessment: Within Functional Limits Thoracic Assessment Thoracic Assessment: Within Functional Limits Lumbar Assessment Lumbar Assessment: Within Functional Limits Postural Control Postural Control: Within Functional Limits  Balance Balance Balance Assessed: Yes Static Sitting Balance Static Sitting - Balance Support: No upper extremity supported Static Sitting - Level of Assistance: 6: Modified independent (Device/Increase time) Dynamic Sitting Balance Dynamic Sitting - Balance Support: No upper extremity supported Dynamic Sitting - Level of Assistance: 6: Modified independent (Device/Increase time) Static Standing Balance Static Standing - Balance Support: Left upper extremity supported;Right upper extremity supported Static Standing - Level of Assistance: 5: Stand by assistance Extremity Assessment  RUE Assessment RUE Assessment: Within Functional Limits LUE Assessment LUE Assessment: Within Functional Limits RLE Assessment RLE Assessment: Within Functional Limits RLE AROM (degrees) RLE Overall AROM Comments: lacking ~5 degrees full extension. max knee flexion ~95degrees.  RLE Strength RLE Overall Strength Comments: 4+/5 throughout with MMT.  LLE AROM (degrees) LLE Overall AROM Comments: WFL, able to achieve full knee extension and at least 90 degrees of knee flexion LLE Strength Left Hip Flexion: 4+/5 Left Knee Flexion: 4/5 Left Knee Extension: 4/5   See Function Navigator for  Current Functional Status.  Jersie Beel E Penven-Crew 09/25/2015, 10:47 AM

## 2015-09-26 LAB — GLUCOSE, CAPILLARY
GLUCOSE-CAPILLARY: 88 mg/dL (ref 65–99)
GLUCOSE-CAPILLARY: 154 mg/dL — AB (ref 65–99)

## 2015-09-26 MED ORDER — METHOCARBAMOL 500 MG PO TABS: 500.0000 mg | ORAL_TABLET | Freq: Four times a day (QID) | ORAL | 0 refills | Status: DC | PRN

## 2015-09-26 MED ORDER — INSULIN GLARGINE 100 UNIT/ML SOLOSTAR PEN
17.0000 [IU] | PEN_INJECTOR | Freq: Every day | SUBCUTANEOUS | 3 refills | Status: DC
Start: 2015-09-26 — End: 2020-07-12

## 2015-09-26 MED ORDER — ASPIRIN 81 MG PO TBEC: 81.0000 mg | DELAYED_RELEASE_TABLET | Freq: Every day | ORAL | 0 refills | Status: DC

## 2015-09-26 MED ORDER — BACLOFEN 10 MG PO TABS: 5.0000 mg | ORAL_TABLET | Freq: Three times a day (TID) | ORAL | 0 refills | Status: DC

## 2015-09-26 MED ORDER — OXYCODONE HCL 5 MG PO TABS: 5.0000 mg | ORAL_TABLET | Freq: Three times a day (TID) | ORAL | 0 refills | Status: DC | PRN

## 2015-09-26 MED ORDER — PREGABALIN 50 MG PO CAPS: 50.0000 mg | ORAL_CAPSULE | Freq: Three times a day (TID) | ORAL | 0 refills | Status: DC

## 2015-09-26 MED ORDER — AMLODIPINE BESYLATE 10 MG PO TABS: 10.0000 mg | ORAL_TABLET | Freq: Every day | ORAL | 0 refills | Status: DC

## 2015-09-26 MED ORDER — CYCLOBENZAPRINE HCL 10 MG PO TABS: 5.0000 mg | ORAL_TABLET | Freq: Every evening | ORAL | 0 refills | Status: DC | PRN

## 2015-09-26 NOTE — Progress Notes (Addendum)
Social Work  Discharge Note  The overall goal for the admission was met for:   Discharge location: Yes - home with spouse and son to provide any needed assistance  Length of Stay: Yes - 11 days  Discharge activity level: Yes - mod independent @ w/c  Home/community participation: Yes  Services provided included: MD, RD, PT, OT, RN, TR, Pharmacy and SW  Financial Services: Medicaid  Follow-up services arranged: Home Health: RN via [facility], DME: hospital bed, left amputee pad for w/c via [facility] and Patient/Family has no preference for HH/DME agencies  Comments (or additional information): Pt does not have a qualifying diagnosis for therapies under Medicaid.  Patient/Family verbalized understanding of follow-up arrangements: Yes  Individual responsible for coordination of the follow-up plan: pt  Confirmed correct DME delivered: Gianpaolo Mindel 09/26/2015    Lewis Grivas

## 2015-09-26 NOTE — Progress Notes (Signed)
Subjective/Complaints: Pt seen laying in bed this AM.  She has questions about discharge medications, but is otherwise ready for discharge.   Review systems: Denies chest pain, shortness of breath, nausea, vomiting, diarrhea.  Objective: Vital Signs: Blood pressure 138/78, pulse 67, temperature 97.8 F (36.6 C), temperature source Oral, resp. rate 18, SpO2 96 %. No results found. Results for orders placed or performed during the hospital encounter of 09/15/15 (from the past 72 hour(s))  Glucose, capillary     Status: Abnormal   Collection Time: 09/23/15 11:23 AM  Result Value Ref Range   Glucose-Capillary 114 (H) 65 - 99 mg/dL  Glucose, capillary     Status: Abnormal   Collection Time: 09/23/15  4:57 PM  Result Value Ref Range   Glucose-Capillary 123 (H) 65 - 99 mg/dL  Glucose, capillary     Status: None   Collection Time: 09/23/15  8:49 PM  Result Value Ref Range   Glucose-Capillary 99 65 - 99 mg/dL  Glucose, capillary     Status: None   Collection Time: 09/24/15  6:46 AM  Result Value Ref Range   Glucose-Capillary 85 65 - 99 mg/dL  Glucose, capillary     Status: None   Collection Time: 09/24/15 12:06 PM  Result Value Ref Range   Glucose-Capillary 83 65 - 99 mg/dL  Glucose, capillary     Status: Abnormal   Collection Time: 09/24/15  4:44 PM  Result Value Ref Range   Glucose-Capillary 124 (H) 65 - 99 mg/dL  Glucose, capillary     Status: Abnormal   Collection Time: 09/24/15  8:14 PM  Result Value Ref Range   Glucose-Capillary 101 (H) 65 - 99 mg/dL  Glucose, capillary     Status: None   Collection Time: 09/25/15  6:48 AM  Result Value Ref Range   Glucose-Capillary 84 65 - 99 mg/dL  CBC with Differential/Platelet     Status: None   Collection Time: 09/25/15  9:57 AM  Result Value Ref Range   WBC 6.2 4.0 - 10.5 K/uL   RBC 4.68 3.87 - 5.11 MIL/uL   Hemoglobin 12.5 12.0 - 15.0 g/dL   HCT 40.3 36.0 - 46.0 %   MCV 86.1 78.0 - 100.0 fL   MCH 26.7 26.0 - 34.0 pg   MCHC 31.0  30.0 - 36.0 g/dL   RDW 14.2 11.5 - 15.5 %   Platelets 284 150 - 400 K/uL   Neutrophils Relative % 60 %   Neutro Abs 3.7 1.7 - 7.7 K/uL   Lymphocytes Relative 33 %   Lymphs Abs 2.1 0.7 - 4.0 K/uL   Monocytes Relative 2 %   Monocytes Absolute 0.1 0.1 - 1.0 K/uL   Eosinophils Relative 4 %   Eosinophils Absolute 0.3 0.0 - 0.7 K/uL   Basophils Relative 1 %   Basophils Absolute 0.1 0.0 - 0.1 K/uL  Basic metabolic panel     Status: Abnormal   Collection Time: 09/25/15  9:57 AM  Result Value Ref Range   Sodium 139 135 - 145 mmol/L   Potassium 4.1 3.5 - 5.1 mmol/L   Chloride 106 101 - 111 mmol/L   CO2 27 22 - 32 mmol/L   Glucose, Bld 124 (H) 65 - 99 mg/dL   BUN 23 (H) 6 - 20 mg/dL   Creatinine, Ser 1.17 (H) 0.44 - 1.00 mg/dL   Calcium 9.2 8.9 - 10.3 mg/dL   GFR calc non Af Amer 51 (L) >60 mL/min   GFR calc Af Wyvonnia Lora  59 (L) >60 mL/min    Comment: (NOTE) The eGFR has been calculated using the CKD EPI equation. This calculation has not been validated in all clinical situations. eGFR's persistently <60 mL/min signify possible Chronic Kidney Disease.    Anion gap 6 5 - 15  Glucose, capillary     Status: None   Collection Time: 09/25/15 11:27 AM  Result Value Ref Range   Glucose-Capillary 92 65 - 99 mg/dL  Glucose, capillary     Status: None   Collection Time: 09/25/15  4:41 PM  Result Value Ref Range   Glucose-Capillary 88 65 - 99 mg/dL  Glucose, capillary     Status: Abnormal   Collection Time: 09/25/15  9:41 PM  Result Value Ref Range   Glucose-Capillary 164 (H) 65 - 99 mg/dL  Glucose, capillary     Status: None   Collection Time: 09/26/15  6:37 AM  Result Value Ref Range   Glucose-Capillary 88 65 - 99 mg/dL    General: NAD. Vital signs reviewed. HEENT: Normocephalic, atraumatic. +Assistive device for communication, status post laryngectomy Cardio: RRR and No murmur Resp: CTA B/L and On labored GI: CTA B/L and Unlabored Musc/Skel:  No edema, minimal tenderness.   Neuro:  Alert and Oriented.  Motor: 5/5 B/l UE and RLE LLE hip flexion 5/5 Skin:   Very minimal drainage from stump incision (improving).   Assessment/Plan: 1. Functional deficits secondary to Left below-knee amputation which require 3+ hours per day of interdisciplinary therapy in a comprehensive inpatient rehab setting. Physiatrist is providing close team supervision and 24 hour management of active medical problems listed below. Physiatrist and rehab team continue to assess barriers to discharge/monitor patient progress toward functional and medical goals. FIM: Function - Bathing Position: Sitting EOB Body parts bathed by patient: Right arm, Left arm, Chest, Abdomen, Front perineal area, Buttocks, Right upper leg, Left upper leg, Right lower leg, Left lower leg, Back Body parts bathed by helper: Back Bathing not applicable: Left lower leg Assist Level: Set up, More than reasonable time Set up : To obtain items  Function- Upper Body Dressing/Undressing What is the patient wearing?: Pull over shirt/dress Pull over shirt/dress - Perfomed by patient: Thread/unthread right sleeve, Thread/unthread left sleeve, Put head through opening, Pull shirt over trunk Assist Level: More than reasonable time Set up : To obtain clothing/put away Function - Lower Body Dressing/Undressing What is the patient wearing?: Underwear, Pants, Socks, Shoes Position: Sitting EOB Underwear - Performed by patient: Thread/unthread right underwear leg, Thread/unthread left underwear leg, Pull underwear up/down Pants- Performed by patient: Thread/unthread right pants leg, Thread/unthread left pants leg, Pull pants up/down Pants- Performed by helper: Pull pants up/down, Thread/unthread left pants leg Non-skid slipper socks- Performed by patient: Don/doff right sock Non-skid slipper socks- Performed by helper: Don/doff right sock Shoes - Performed by patient: Don/doff right shoe Assist for footwear: Independent Assist for  lower body dressing: More than reasonable time Set up : To obtain clothing/put away  Function - Toileting Toileting steps completed by patient: Adjust clothing prior to toileting, Performs perineal hygiene, Adjust clothing after toileting Toileting steps completed by helper: Adjust clothing prior to toileting, Adjust clothing after toileting Toileting Assistive Devices: Grab bar or rail Assist level: Supervision or verbal cues  Function - Toilet Transfers Toilet transfer assistive device: Bedside commode, Drop arm commode Assist level to toilet: Supervision or verbal cues Assist level from toilet: Supervision or verbal cues Assist level to bedside commode (at bedside): Supervision or verbal cues Assist level from  bedside commode (at bedside): Supervision or verbal cues     Function - Chair/bed transfer Chair/bed transfer method: Lateral scoot Chair/bed transfer assist level: No Help, no cues, assistive device, takes more than a reasonable amount of time Chair/bed transfer assistive device: Armrests, Sliding board Chair/bed transfer details: Verbal cues for safe use of DME/AE  Function - Locomotion: Wheelchair Will patient use wheelchair at discharge?: Yes Type: Manual Max wheelchair distance: 150 Assist Level: No help, No cues, assistive device, takes more than reasonable amount of time Assist Level: No help, No cues, assistive device, takes more than reasonable amount of time Assist Level: No help, No cues, assistive device, takes more than reasonable amount of time Turns around,maneuvers to table,bed, and toilet,negotiates 3% grade,maneuvers on rugs and over doorsills: Yes Function - Locomotion: Ambulation Ambulation activity did not occur: Safety/medical concerns Walk 10 feet activity did not occur: Safety/medical concerns Walk 50 feet with 2 turns activity did not occur: Safety/medical concerns Walk 150 feet activity did not occur: Safety/medical concerns Walk 10 feet on  uneven surfaces activity did not occur: Safety/medical concerns  Function - Comprehension Comprehension: Auditory Comprehension assist level: Follows complex conversation/direction with no assist  Function - Expression Expression: Verbal Expression assistive device: Other (Comment) (voice box) Expression assist level: Expresses complex ideas: With extra time/assistive device  Function - Social Interaction Social Interaction assist level: Interacts appropriately with others - No medications needed.  Function - Problem Solving Problem solving assist level: Solves complex 90% of the time/cues < 10% of the time  Function - Memory Memory assist level: Complete Independence: No helper Patient normally able to recall (first 3 days only): Location of own room, Staff names and faces, That he or she is in a hospital, Current season  Medical Problem List and Plan: 1.  Functional and mobility deficits secondary to left BKA on 7/19  Cont CIR 2.  DVT Prophylaxis/Anticoagulation: Pharmaceutical: Lovenox, 40 mg per day, monitor for signs of DVT 3. Chronic pain/Pain Management: Was on Suboxone tid with lyrica qid (has been refusing).  Post op on percocet, oxycodone and IV dilaudid to help with pain and baclofen tid to help manage spams.  Continue suboxone  Lyrica d/ced per pt request, changed to Gabapentin 900 TID, decreased to 600 TID on 7/25, decreased to 300 on 7/27, changed back to Lyrica 50 TID per pt request on 7/28.   Dilaudid changed to Oxycodone 7/24, changed to q6PRN on 7/27  Robaxin 535m q6 PRN added on 7/25 which is effective 4. Mood: Team to provide ego support. LCSW to follow for evaluation and support.   5. Neuropsych: This patient is capable of making decisions on her own behalf. 6. Skin/Wound Care: routine pressure relief measures.    ACE/dry dressing 7. Fluids/Electrolytes/Nutrition: Monitor I/O.    8. DM type 2 with insensate neuropathy: Monitor BS ac/hs. Continue lantus insulin  at bedtime with SSI for tighter control.  Titrate medications as indicated.    One spike yesterday, otherwise tight control  9. COPD with SOB/OSA: Does not use CPAP. No current respiratory issues 10 Morbid obesity: Encourage weight loss diet. 11. HTN: Monitor BP bid. Continue Norvasc daily.     Will cont to monitor  Controlled at present 12. Candida vaginitis:  Treated with diflucan X 2 doses.  13. History of laryngeal carcinoma status post laryngectomy 14. CKD:  Cr. 1.17 on 7/31  Cont to monitor 15. Constipation: Resolved  Increased bowel reg on 7/24 and again on 7/26  LOS (Days) 11 A  FACE TO FACE EVALUATION WAS PERFORMED  [name] Lorie Phenix 09/26/2015, 9:14 AM

## 2015-09-26 NOTE — Progress Notes (Signed)
Patient discharged to home, accompanied by her son.

## 2015-09-26 NOTE — Discharge Summary (Signed)
Physician Discharge Summary  Patient ID: [name] MRN: [id number] DOB/AGE: [birth year] 60 y.o.  Admit date: 09/15/2015 Discharge date: 09/26/2015  Discharge Diagnoses:  Principal Problem:   Unilateral complete BKA ([place]) Active Problems:   Simple chronic bronchitis (HCC)   Primary osteoarthritis of right knee   DM type 2 with diabetic peripheral neuropathy (HCC)   Neuropathic pain   Muscle spasm   Constipation due to pain medication   Discharged Condition: stable.    Labs:  Basic Metabolic Panel:  Recent Labs Lab 09/22/15 0449 09/25/15 0957  NA  --  139  K  --  4.1  CL  --  106  CO2  --  27  GLUCOSE  --  124*  BUN  --  23*  CREATININE 1.23* 1.17*  CALCIUM  --  9.2    CBC:  Recent Labs Lab 09/25/15 0957  WBC 6.2  NEUTROABS 3.7  HGB 12.5  HCT 40.3  MCV 86.1  PLT 284    CBG:  Recent Labs Lab 09/25/15 1127 09/25/15 1641 09/25/15 2141 09/26/15 0637 09/26/15 1134  GLUCAP 92 88 164* 26 154*    Brief HPI:   [name] is a 60 y.o. 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 She elected to go Home and was admitted on 07/15 pm with fever and SIRS reaction due to wound infection. She was started on broad spectrum antibiotics but wound continued to decline. She underwent L-BKA by Dr. Sharol Given on 09/13/15 and completed antibiotic regimen 7/21. Post op she has had acute on chronic pain with high levels of anxiety.  Therapy ongoing with improvement in participation and CIR recommended for follow up therapy.   Hospital Course: Marchell F Robidoux was admitted to rehab 09/15/2015 for inpatient therapies to consist of PT, ST and OT at least three hours five days a week. Past admission physiatrist, therapy team and rehab RN have  worked together to provide customized collaborative inpatient rehab. Blood pressures were monitored on bid basis and have been controlled on Norvasc. She was treated with diflucan X 2 doses for candida vaginitis. serial BMET shows that CKD is improving and CBC shows that ABLA has resolved.  Po intake has been good and protein supplements were added to help promote wound healing. Blood sugars have been monitored at ac/hs basis and have been reasonably controlled. She did have minimal drainage from central part of L-BKA incision but no odor, redness or other signs of infection noted.    She has been educated on desensitization techniques and Lyrica was transitioned to Neurontin due to reports of sedation.  As this was ineffective in managing her symptoms, patient requesting that lyrica be resumed at lower dose without sedative side effects.  Baclofen was added to help control spasms and she requires additional dose of robaxin at times for break thorough symptoms.  She currently continues on oxycodone prn for acute on chronic pain and was instructed on weaning latter over next few weeks. She has made steady progress during her rehab stay and is modified independent at wheelchair level.  HHPT recommended for follow up but patient does not qualify due to Medicaid constraints. She has been educated on HEP and Amaya to provide [facility] for monitoring of wound. She was discharged to home on  09/26/15.    Rehab course: During patient's stay in rehab weekly team conferences were held to monitor patient's progress, set goals and discuss barriers to discharge. At admission, patient required mod assist with mobility and basic self care tasks.  She has had improvement in activity tolerance, balance, postural control, as well as ability to compensate for deficits. She requires set up supervision for bathing and is able to complete dressing tasks independently. She is able to complete simple meal prep tasks with  supervision.  She is able to perform SB transfers and propel her wheelchair for 300' at modified independent level. She has been educated on HEP and is able to complete these with min cues. Family education was completed with son who will provide supervision after discharge.     Disposition: 01-Home or Self Care  Diet: Diabetic.  Wound Care:  Wash with soap and water. Pat dry and apply compressive dressing. Contact MD if you develop any problems with your incision/wound--redness, swelling, increase in pain, drainage or if you develop fever or chills.   Special Instructions: 1. Check blood sugars 2-3 times  a day and record.   Discharge Instructions    Ambulatory referral to Physical Medicine Rehab    Complete by:  As directed   One week follow up post L-BKA    2. Drink plenty of fluids.      Medication List    TAKE these medications   albuterol 108 (90 Base) MCG/ACT inhaler Commonly known as:  PROVENTIL HFA;VENTOLIN HFA Inhale 2 puffs into the lungs every 4 (four) hours as needed for wheezing or shortness of breath.   amLODipine 10 MG tablet Commonly known as:  NORVASC Take 1 tablet (10 mg total) by mouth daily.   aspirin 81 MG EC tablet Take 1 tablet (81 mg total) by mouth daily.   baclofen 10 MG tablet Commonly known as:  LIORESAL Take 0.5 tablets (5 mg total) by mouth 3 (three) times daily.   blood glucose meter kit and supplies Kit Dispense based on patient and insurance preference. Use up to four times daily as directed. (FOR ICD-9 250.00, 250.01).   cyclobenzaprine 10 MG tablet Commonly known as:  FLEXERIL Take 0.5 tablets (5 mg total) by mouth at bedtime as needed for muscle spasms. What changed:  how much to take   docusate sodium 100 MG capsule Commonly known as:  COLACE Take 100 mg by mouth daily as needed for mild constipation.   ibuprofen 200 MG tablet Commonly known as:  ADVIL,MOTRIN Take 400-600 mg by mouth every 6 (six) hours as needed for fever,  headache or moderate pain.   Insulin Glargine 100 UNIT/ML Solostar Pen Commonly known as:  LANTUS SOLOSTAR Inject 17 Units into the skin daily. At 10AM What changed:  how much to take   multivitamin tablet Take 1 tablet by mouth daily with supper.   NOVOLOG FLEXPEN 100 UNIT/ML FlexPen Generic drug:  insulin aspart Inject 3 Units into the skin 3 (three) times daily with meals.   oxyCODONE 5 MG immediate release tablet--Rx # 40 pills  Commonly known as:  Oxy IR/ROXICODONE Take 1-2 tablets (5-10 mg total) by mouth every 8 (eight) hours as needed for severe pain or breakthrough pain.   Pen Needles 3/16" 31G X 5 MM Misc Use as directed.   pregabalin 50 MG capsule--Rx # 90 pills  Commonly known as:  LYRICA Take 1 capsule (50 mg total) by mouth 3 (three) times daily. What changed:  medication strength  how much to take  when to take this   SUBOXONE 8-2 MG Film Generic drug:  Buprenorphine HCl-Naloxone HCl Place 1 Film under the tongue 3 (three) times daily.      Follow-up Information    EDWARDS, MICHELLE P, NP Follow up on 10/23/2015.   Specialty:  Internal Medicine Why:  @ 2:15 pm (hospital follow up appointment) Contact information: [place] [address] [phone]        Ankit Lorie Phenix, MD .   Specialty:  Physical Medicine and Rehabilitation Why:  office will call you with follow up appointment Contact information: [address] [place] [address] [phone]        Newt Minion, MD. Call today.   Specialty:  Orthopedic Surgery Why:  for follow up appointment in 5-7 days Contact information: [place] [address] [phone]           Signed: Bary Leriche 09/26/2015, 5:16 PM

## 2015-09-26 NOTE — Discharge Instructions (Signed)
Inpatient Rehab Discharge Instructions  [name] Discharge date and time: 09/26/15   Activities/Precautions/ Functional Status: Activity: no lifting, driving, or strenuous exercise till clearedb by MD.  Diet: diabetic diet. Drink at least 5 glasses of water daily.  Wound Care:  Wash with soap and water. Pat dry and apply compressive dressing. Contact MD if you develop any problems with your incision/wound--redness, swelling, increase in pain, drainage or if you develop fever or chills.    Functional status:  ___ No restrictions     ___ Walk up steps independently ___ 24/7 supervision/assistance   ___ Walk up steps with assistance _X__ Intermittent supervision/assistance  ___ Bathe/dress independently ___ Walk with walker     _X__ Bathe/dress with assistance ___ Walk Independently    ___ Shower independently ___ Walk with assistance    ___ Shower with assistance _X__ No alcohol     ___ Return to work/school ________     COMMUNITY REFERRALS UPON DISCHARGE:    Home Health:   RN                       Agency: [facility] Phone: [phone]   Medical Equipment/Items Ordered:  Hospital bed and amputee support pad (for wheelchair)                                                      Agency/Supplier:  [employer] @ [phone]   GENERAL COMMUNITY RESOURCES FOR PATIENT/FAMILY:  Support Groups:  Amputee Support Group     Special Instructions: 1. Check blood sugars twice a day and record.    My questions have been answered and I understand these instructions. I will adhere to these goals and the provided educational materials after my discharge from the hospital.  Patient/Caregiver Signature _______________________________ Date __________  Clinician Signature _______________________________________ Date __________  Please bring this form and your medication list with you to all your follow-up doctor's appointments.

## 2015-09-27 ENCOUNTER — Telehealth: Payer: Self-pay

## 2015-09-27 NOTE — Telephone Encounter (Signed)
Spoke with husband, he advised to call back later this afternoon to get in touch with pt.

## 2015-09-27 NOTE — Telephone Encounter (Signed)
  1. Are you/is patient experiencing any problems since coming home? Are there any questions regarding any aspect of care? No issues.  2. Are there any questions regarding medications administration/dosing? Are meds being taken as prescribed? Patient should review meds with caller to confirm. Meds have been confirmed.  3. Have there been any falls? No falls 4. Has Home Health been to the house and/or have they contacted you? If not, have you tried to contact them? Can we help you contact them? [facility] RN has seen pt.  5. Are bowels and bladder emptying properly? Are there any unexpected incontinence issues? If applicable, is patient following bowel/bladder programs? No issues.  6. Any fevers, problems with breathing, unexpected pain? No issues. 7. Are there any skin problems or new areas of breakdown? No issues. 8. Has the patient/family member arranged specialty MD follow up (ie cardiology/neurology/renal/surgical/etc)?  Can we help arrange? Pt has made follow up appointments.  9. Does the patient need any other services or support that we can help arrange? Denies services.  10. Are caregivers following through as expected in assisting the patient? Pt's husband and son are assisting pt.   11. Has the patient quit smoking, drinking alcohol, or using drugs as recommended? Pt is not smoking, drinking alcohol or using drugs.   Spoke with pt's son. He is aware of pt's appointment on 10/05/15 @ 9:20. New pt packet sent.

## 2015-10-04 ENCOUNTER — Encounter: Payer: Self-pay | Admitting: Physical Medicine & Rehabilitation

## 2015-10-04 ENCOUNTER — Encounter: Payer: Medicaid Other | Attending: Physical Medicine & Rehabilitation | Admitting: Physical Medicine & Rehabilitation

## 2015-10-04 VITALS — BP 144/86 | HR 61

## 2015-10-04 DIAGNOSIS — Z6841 Body Mass Index (BMI) 40.0 and over, adult: Secondary | ICD-10-CM | POA: Insufficient documentation

## 2015-10-04 DIAGNOSIS — G894 Chronic pain syndrome: Secondary | ICD-10-CM | POA: Diagnosis not present

## 2015-10-04 DIAGNOSIS — E1140 Type 2 diabetes mellitus with diabetic neuropathy, unspecified: Secondary | ICD-10-CM | POA: Insufficient documentation

## 2015-10-04 DIAGNOSIS — M170 Bilateral primary osteoarthritis of knee: Secondary | ICD-10-CM | POA: Diagnosis not present

## 2015-10-04 DIAGNOSIS — K5903 Drug induced constipation: Secondary | ICD-10-CM

## 2015-10-04 DIAGNOSIS — Z87442 Personal history of urinary calculi: Secondary | ICD-10-CM | POA: Insufficient documentation

## 2015-10-04 DIAGNOSIS — M797 Fibromyalgia: Secondary | ICD-10-CM | POA: Diagnosis not present

## 2015-10-04 DIAGNOSIS — Z87891 Personal history of nicotine dependence: Secondary | ICD-10-CM | POA: Insufficient documentation

## 2015-10-04 DIAGNOSIS — I129 Hypertensive chronic kidney disease with stage 1 through stage 4 chronic kidney disease, or unspecified chronic kidney disease: Secondary | ICD-10-CM | POA: Insufficient documentation

## 2015-10-04 DIAGNOSIS — C760 Malignant neoplasm of head, face and neck: Secondary | ICD-10-CM | POA: Insufficient documentation

## 2015-10-04 DIAGNOSIS — Z794 Long term (current) use of insulin: Secondary | ICD-10-CM | POA: Insufficient documentation

## 2015-10-04 DIAGNOSIS — J449 Chronic obstructive pulmonary disease, unspecified: Secondary | ICD-10-CM | POA: Diagnosis not present

## 2015-10-04 DIAGNOSIS — B370 Candidal stomatitis: Secondary | ICD-10-CM | POA: Insufficient documentation

## 2015-10-04 DIAGNOSIS — K5900 Constipation, unspecified: Secondary | ICD-10-CM | POA: Diagnosis not present

## 2015-10-04 DIAGNOSIS — N189 Chronic kidney disease, unspecified: Secondary | ICD-10-CM | POA: Insufficient documentation

## 2015-10-04 DIAGNOSIS — F329 Major depressive disorder, single episode, unspecified: Secondary | ICD-10-CM | POA: Diagnosis not present

## 2015-10-04 DIAGNOSIS — E1122 Type 2 diabetes mellitus with diabetic chronic kidney disease: Secondary | ICD-10-CM | POA: Diagnosis not present

## 2015-10-04 DIAGNOSIS — Z89512 Acquired absence of left leg below knee: Secondary | ICD-10-CM | POA: Diagnosis not present

## 2015-10-04 DIAGNOSIS — G8929 Other chronic pain: Secondary | ICD-10-CM | POA: Insufficient documentation

## 2015-10-04 DIAGNOSIS — E1159 Type 2 diabetes mellitus with other circulatory complications: Secondary | ICD-10-CM | POA: Diagnosis not present

## 2015-10-04 DIAGNOSIS — G546 Phantom limb syndrome with pain: Secondary | ICD-10-CM

## 2015-10-04 DIAGNOSIS — F419 Anxiety disorder, unspecified: Secondary | ICD-10-CM | POA: Diagnosis not present

## 2015-10-04 DIAGNOSIS — Z5189 Encounter for other specified aftercare: Secondary | ICD-10-CM | POA: Diagnosis present

## 2015-10-04 MED ORDER — POLYETHYLENE GLYCOL 3350 17 G PO PACK: 17.0000 g | PACK | Freq: Every day | ORAL | 0 refills | Status: DC

## 2015-10-04 MED ORDER — MAGIC MOUTHWASH: 5.0000 mL | Freq: Three times a day (TID) | ORAL | 0 refills | Status: AC

## 2015-10-04 NOTE — Progress Notes (Addendum)
Subjective:    Patient ID: [name], female    DOB: 05-16-1955, 60 y.o.   MRN: [id number]  HPI  60 y.o. 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 Admit date: 09/15/2015 Discharge date: 09/26/2015 At discharge, she was instructed to clean with soap and water, which has been.  She has not noted any change in drainage, fevers, chills. She has been checking her CBGs, which have been in the 120s. She has an appointment with her PCP in 2 weeks .  She sees Dr. Sharol Given later today.  She goes to the pain clinic next week. She is taking her baclofen and robaxin, which have been controlling the spasms. She has phantom limb pain, but it is not that bad.    DME: Hospital bed (pt previously with bathroom equipment and bedside commode) Therapies: Came 1 time Mobility: Wheelchair mobility  Pain Inventory Average Pain 6 Pain Right Now 6 My pain is intermittent, burning, stabbing and tingling  In the last 24 hours, has pain interfered with the following? General activity 4 Relation with others 2 Enjoyment of life 0 What TIME of day is your pain at its worst? evening and night Sleep (in general) Good  Pain is worse with: bending and some activites Pain improves with: rest and medication Relief from Meds: 3  Mobility ability to climb steps?  no do you drive?  yes use a wheelchair transfers alone Do you have any goals in this area?  yes  Function retired I need assistance with the following:  dressing, bathing, meal prep and shopping  Neuro/Psych weakness numbness tremor tingling trouble walking spasms loss of taste or smell  Prior Studies Any changes since last visit?  no  Physicians involved in your care Any changes since last visit?   yes   Family History  Problem Relation Age of Onset  . Adopted: Yes   Social History   Social History  . Marital status: Married    Spouse name: N/A  . Number of children: N/A  . Years of education: N/A   Social History Main Topics  . Smoking status: Former Smoker    Packs/day: 1.00    Years: 40.00    Types: Cigarettes    Quit date: 05/22/2015  . Smokeless tobacco: Never Used  . Alcohol use No  . Drug use: No  . Sexual activity: Not Asked   Other Topics Concern  . None   Social History Narrative  . None   Past Surgical History:  Procedure Laterality Date  . AMPUTATION Left 08/25/2015   Procedure: 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;  Surgeon: Newt Minion, MD;  Location: [place];  Service: Orthopedics;  Laterality: Left;  . AMPUTATION Left 09/13/2015   Procedure: AMPUTATION BELOW KNEE;  Surgeon: Marybelle Killings, MD;  Location: [place];  Service: Orthopedics;  Laterality: Left;  . BRAIN SURGERY     vertebral aneurysm  . BREAST LUMPECTOMY  1982   benign  . CARPEL TUNNEL    . CHOLECYSTECTOMY    . CYSTOSCOPY W/ URETERAL STENT PLACEMENT Left 03/22/2014   Procedure: CYSTOSCOPY WITH RETROGRADE PYELOGRAM/URETERAL STENT PLACEMENT;  Surgeon: Alexis Frock, MD;  Location: [facility];  Service: Urology;  Laterality: Left;  . CYSTOSCOPY WITH RETROGRADE PYELOGRAM, URETEROSCOPY AND STENT PLACEMENT Left 05/11/2014  Procedure: CYSTOSCOPY WITH RETROGRADE PYELOGRAM, URETEROSCOPY AND STENT PLACEMENT;  Surgeon: Alexis Frock, MD;  [facility];  Service: Urology;  Laterality: Left;  . HOLMIUM LASER APPLICATION Left 05/10/4006   Procedure: HOLMIUM LASER APPLICATION;  Surgeon: Alexis Frock, MD;  [facility];  Service: Urology;  Laterality: Left;  . LARYNGECTOMY     total ( stoma)  . LEG AMPUTATION BELOW KNEE Left 09/13/2015  . SALIVARY STONE REMOVAL    . TONSILLECTOMY    . TUBAL LIGATION     Past Medical History:  Diagnosis Date  . Anxiety   . Arthritis   . COPD (chronic obstructive  pulmonary disease) ([place])   . Depression   . Diabetes mellitus without complication ([place])    INSULIN DEPENDENT  . Diabetic neuropathy ([place])   . Difficult intubation    total laryngectomy  . Fibromyalgia   . Head and neck cancer ([place]) 04/19/2015  . Hypertension   . Kidney stone   . Morbid obesity with BMI of 40.0-44.9, adult ([place])   . Neuropathy ([place])   . Nocturia   . Pneumonia   . Rash, skin    face  . Renal insufficiency   . Shortness of breath   . Sleep apnea    has not used cpap past 10 yrs  . Urge incontinence    BP (!) 144/86   Pulse 61   SpO2 94%   Opioid Risk Score:   Fall Risk Score:  `1  Depression screen PHQ 2/9  Depression screen PHQ 2/9 10/04/2015  Decreased Interest 0  Down, Depressed, Hopeless 1  PHQ - 2 Score 1  Altered sleeping 0  Tired, decreased energy 1  Change in appetite 0  Feeling bad or failure about yourself  0  Trouble concentrating 0  Moving slowly or fidgety/restless 0  Suicidal thoughts 0  PHQ-9 Score 2    Review of Systems  Constitutional: Negative.   HENT: Negative.   Eyes: Negative.   Respiratory: Negative.   Cardiovascular: Negative.   Gastrointestinal: Positive for constipation.  Endocrine: Negative.   Musculoskeletal: Positive for back pain and neck pain.  Skin: Positive for rash.  Allergic/Immunologic: Negative.   Neurological: Positive for tremors and numbness.  Hematological: Negative.   Psychiatric/Behavioral: Negative.        Objective:   Physical Exam General: NAD. Vital signs reviewed. HEENT: Normocephalic, atraumatic. +Assistive device for communication, status post laryngectomy. Oral thrush. Cardio: RRR and No murmur Resp: CTA B/L and On labored GI: CTA B/L and Unlabored Musc/Skel:  No edema, minimal tenderness.   Neuro: Alert and Oriented.  Motor: 5/5 B/l UE and RLE LLE hip flexion 5/5 Skin:  No minimal drainage from stump incision, staples intact.     Assessment & Plan:  60 y.o. 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  Medical Problem List and Plan: 1.  Functional and mobility deficits secondary to left BKA on 7/19  Cont wheelchair for safety  Cont follow with Dr. Sharol Given  ?Therapies to resume after prosthesis  2. Chronic pain/Pain Management with phantom limb pain:   Cont meds per chronic pain clinic.   Cont baclofen and robaxin, pt may call office if pain clinic would prefer [place] to prescribe  3. Surgical wound  Staples in place.  Likely d/ced today with  Dr. Sharol Given  Consider stump shrinker  4. DM type 2 with insensate neuropathy: Monitor BS ac/hs.   Continue lantus insulin at bedtime with SSI for tighter control.    Appears well controlled  Follow up with PCP  5. Oral Thrush  Will order nystatin  6. Constipation  Will add miralax  Referrals reviewed Meds reviewed All questions answered

## 2015-10-05 ENCOUNTER — Encounter: Payer: Medicaid Other | Admitting: Physical Medicine & Rehabilitation

## 2015-10-26 ENCOUNTER — Other Ambulatory Visit: Payer: Self-pay | Admitting: Physical Medicine and Rehabilitation

## 2015-11-03 ENCOUNTER — Telehealth: Payer: Self-pay | Admitting: *Deleted

## 2015-11-03 NOTE — Telephone Encounter (Signed)
Patient called asking for a refill on Magic Mouthwash. Conferred with Dr. Posey Pronto and he stated that patient needs to get Rx from PCP.  Patient was called, LVM, passed on Dr. Ena Dawley advice

## 2015-12-11 ENCOUNTER — Other Ambulatory Visit: Payer: Self-pay | Admitting: *Deleted

## 2015-12-11 MED ORDER — BACLOFEN 10 MG PO TABS: 5.0000 mg | ORAL_TABLET | Freq: Three times a day (TID) | ORAL | 1 refills | Status: DC

## 2015-12-14 ENCOUNTER — Ambulatory Visit (INDEPENDENT_AMBULATORY_CARE_PROVIDER_SITE_OTHER): Payer: Self-pay | Admitting: Orthopedic Surgery

## 2016-01-01 ENCOUNTER — Ambulatory Visit (INDEPENDENT_AMBULATORY_CARE_PROVIDER_SITE_OTHER): Payer: Medicaid Other | Admitting: Orthopedic Surgery

## 2016-01-01 ENCOUNTER — Encounter (INDEPENDENT_AMBULATORY_CARE_PROVIDER_SITE_OTHER): Payer: Self-pay | Admitting: Orthopedic Surgery

## 2016-01-01 VITALS — Ht 66.0 in | Wt 220.0 lb

## 2016-01-01 DIAGNOSIS — Z89512 Acquired absence of left leg below knee: Secondary | ICD-10-CM

## 2016-01-01 MED ORDER — METHOCARBAMOL 500 MG PO TABS: 500.0000 mg | ORAL_TABLET | Freq: Four times a day (QID) | ORAL | 0 refills | Status: DC | PRN

## 2016-01-01 MED ORDER — BACLOFEN 10 MG PO TABS: 5.0000 mg | ORAL_TABLET | Freq: Three times a day (TID) | ORAL | 1 refills | Status: DC

## 2016-01-01 NOTE — Addendum Note (Signed)
Addended by: Meridee Score on: 01/01/2016 10:18 AM   Modules accepted: Orders

## 2016-01-01 NOTE — Progress Notes (Signed)
Wound Care Note   Patient: [name]           Date of Birth: 01/11/1956           MRN: [id number]             PCP: Kerin Perna, NP Visit Date: 01/01/2016   Assessment & Plan: Visit Diagnoses:  1. Hx of amputation of leg through tibia and fibula, left (HCC)    Swelling left transtibial amputation. Plan: Prescription provided for biotech for right knee smaller prosthetic shrinker on the left. Refills provided for baclofen and Robaxin. Follow-up in the office in 2 months.   Follow-Up Instructions: Return in about 2 months (around 03/02/2016).  Orders:  No orders of the defined types were placed in this encounter.  Meds ordered this encounter  Medications  . methocarbamol (ROBAXIN) 500 MG tablet    Sig: Take 1 tablet (500 mg total) by mouth every 6 (six) hours as needed for muscle spasms.    Dispense:  60 tablet    Refill:  0  . baclofen (LIORESAL) 10 MG tablet    Sig: Take 0.5 tablets (5 mg total) by mouth 3 (three) times daily.    Dispense:  45 tablet    Refill:  1      Procedures: No notes on file   Clinical Data: No additional findings.   No images are attached to the encounter.   Subjective: Chief Complaint  Patient presents with  . Left Knee - Follow-up, Routine Post Op  . Routine Post Op    Left transtibial amputation 09/13/15    Patient presents today for 4 week follow up left below knee amputation on 09/13/15. She is approximately 4 months post op. She is still not able to get fitted for prosthetic due to ulcerations anterior residual limb. There is minimal drainage with fibrinous exudate. There is no odor, no redness, swelling. She is currently no doing any type of wound care, she is just wearing shrinker. She is requesting refill on baclofen and robaxin for muscle spasms in her leg.    Review of Systems  Miscellaneous:  -Home Health Care: N/A  -Physical Therapy: N/A  -Out of Work?: N/A  -Worker's Compensation Case?: N/A  -Additional  Information: N/A   Objective: Vital Signs: Ht '5\' 6"'$  (1.676 m)   Wt 220 lb (99.8 kg)   BMI 35.51 kg/m   Physical Exam: Patient is alert oriented no adenopathy well-dressed normal affect normal respiratory effort.  On examination patient ambulates in a wheelchair. She does have swelling and brawny skin color changes for her left residual limb with venous insufficiency. There are 2 small wounds with a very small amount of clear drainage secondary to the venous stasis swelling there is no redness no cellulitis no odor no signs of infection. Patient has a loose fitting prosthetic shrinker.  Specialty Comments: No specialty comments available.   PMFS History: Patient Active Problem List   Diagnosis Date Noted  . Post-operative pain   . Constipation due to pain medication   . Neuropathic pain   . Muscle spasm   . SIRS (systemic inflammatory response syndrome) ([place]) 09/18/2015  . Labile blood pressure   . DM type 2 with diabetic peripheral neuropathy ([place])   . Abnormality of gait   . Unilateral complete BKA ([place]) 09/15/2015  . Status post below knee amputation of left lower extremity ([place])   . Simple chronic bronchitis ([place])   . Primary osteoarthritis of right knee   .  Hx of amputation of leg through tibia and fibula, left ([place])   . Infection   . Sepsis ([place]) 09/09/2015  . Wound infection 09/09/2015  . 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 Type 2 diabetes mellitus with circulatory disorder ([place])   . Osteomyelitis of foot, acute, left 08/25/2015  . Diabetic foot infection ([place]) 04/19/2015  . Diabetic foot ulcer ([place]) 04/19/2015  . CKD (chronic kidney disease) 04/19/2015  . Head and neck cancer ([place]) 04/19/2015  . Right hip pain 04/19/2015  . Infection of supraglottis (throat) 09/16/2014  . Incidental lung nodule, less than or equal to 29m 09/16/2014  . Obstructive uropathy 03/22/2014  . COPD (chronic obstructive pulmonary disease) ([place] 03/20/2014  .  Controlled type 2 diabetes mellitus with diabetic nephropathy ([place] 03/20/2014  . CAP (community acquired pneumonia)   . Lice infested hair 075/64/3329 . Tobacco abuse 08/05/2012  . TINEA CORPORIS 09/01/2009  . DEPRESSION 08/11/2009  . COPD 08/11/2009  . Chronic pain syndrome 04/15/2007  . HYPERLIPIDEMIA NEC/NOS 11/02/2006  . PERIPHERAL VASCULAR DISEASE 11/02/2006  . MORBID OBESITY 10/28/2006  . Obstructive sleep apnea 10/28/2006  . POLYNEUROPATHY 10/28/2006  . Benign essential HTN 10/28/2006  . OSTEOARTHRITIS, KNEES, BILATERAL 10/28/2006  . PROTEINURIA 11/25/2005   Past Medical History:  Diagnosis Date  . Anxiety   . Arthritis   . COPD (chronic obstructive pulmonary disease) ([place]   . Depression   . Diabetes mellitus without complication ([place]    INSULIN DEPENDENT  . Diabetic neuropathy ([place]   . Difficult intubation    total laryngectomy  . Fibromyalgia   . Head and neck cancer ([place] 04/19/2015  . Hypertension   . Kidney stone   . Morbid obesity with BMI of 40.0-44.9, adult ([place]   . Neuropathy ([place]   . Nocturia   . Pneumonia   . Rash, skin    face  . Renal insufficiency   . Shortness of breath   . Sleep apnea    has not used cpap past 10 yrs  . Urge incontinence     Family History  Problem Relation Age of Onset  . Adopted: Yes   Past Surgical History:  Procedure Laterality Date  . AMPUTATION Left 08/25/2015   Procedure: 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;  Surgeon: MNewt Minion MD;  [place]  Service: Orthopedics;  Laterality: Left;  . AMPUTATION Left 09/13/2015   Procedure: AMPUTATION BELOW KNEE;  Surgeon: MMarybelle Killings MD;  [place] [facility]  Service: Orthopedics;  Laterality: Left;  . BRAIN SURGERY     vertebral aneurysm  . BREAST LUMPECTOMY  1982   benign  . CARPEL TUNNEL    . CHOLECYSTECTOMY    . CYSTOSCOPY W/ URETERAL STENT PLACEMENT Left 03/22/2014   Procedure: CYSTOSCOPY WITH RETROGRADE PYELOGRAM/URETERAL STENT PLACEMENT;  Surgeon: TAlexis Frock MD;  [facility]  Service: Urology;  Laterality: Left;  . CYSTOSCOPY WITH RETROGRADE PYELOGRAM, URETEROSCOPY AND STENT PLACEMENT Left 05/11/2014   Procedure: CYSTOSCOPY WITH RETROGRADE PYELOGRAM, URETEROSCOPY AND STENT PLACEMENT;  Surgeon: TAlexis Frock MD;  [facility];  Service: Urology;  Laterality: Left;  . HOLMIUM LASER APPLICATION Left 35/18/8416  Procedure: HOLMIUM LASER APPLICATION;  Surgeon: TAlexis Frock MD;  [facility];  Service: Urology;  Laterality: Left;  . LARYNGECTOMY     total ( stoma)  . LEG AMPUTATION BELOW KNEE Left 09/13/2015  . SALIVARY STONE REMOVAL    . TONSILLECTOMY    . TUBAL LIGATION  Social History   Occupational History  . Not on file.   Social History Main Topics  . Smoking status: Former Smoker    Packs/day: 1.00    Years: 40.00    Types: Cigarettes    Quit date: 05/22/2015  . Smokeless tobacco: Never Used  . Alcohol use No  . Drug use: No  . Sexual activity: Not on file

## 2016-01-04 ENCOUNTER — Encounter: Payer: Medicaid Other | Attending: Physical Medicine & Rehabilitation | Admitting: Physical Medicine & Rehabilitation

## 2016-01-04 DIAGNOSIS — Z794 Long term (current) use of insulin: Secondary | ICD-10-CM | POA: Insufficient documentation

## 2016-01-04 DIAGNOSIS — B370 Candidal stomatitis: Secondary | ICD-10-CM | POA: Insufficient documentation

## 2016-01-04 DIAGNOSIS — K5900 Constipation, unspecified: Secondary | ICD-10-CM | POA: Insufficient documentation

## 2016-01-04 DIAGNOSIS — Z6841 Body Mass Index (BMI) 40.0 and over, adult: Secondary | ICD-10-CM | POA: Insufficient documentation

## 2016-01-04 DIAGNOSIS — M797 Fibromyalgia: Secondary | ICD-10-CM | POA: Insufficient documentation

## 2016-01-04 DIAGNOSIS — N189 Chronic kidney disease, unspecified: Secondary | ICD-10-CM | POA: Insufficient documentation

## 2016-01-04 DIAGNOSIS — Z89512 Acquired absence of left leg below knee: Secondary | ICD-10-CM | POA: Insufficient documentation

## 2016-01-04 DIAGNOSIS — G8929 Other chronic pain: Secondary | ICD-10-CM | POA: Insufficient documentation

## 2016-01-04 DIAGNOSIS — J449 Chronic obstructive pulmonary disease, unspecified: Secondary | ICD-10-CM | POA: Insufficient documentation

## 2016-01-04 DIAGNOSIS — Z87891 Personal history of nicotine dependence: Secondary | ICD-10-CM | POA: Insufficient documentation

## 2016-01-04 DIAGNOSIS — M170 Bilateral primary osteoarthritis of knee: Secondary | ICD-10-CM | POA: Insufficient documentation

## 2016-01-04 DIAGNOSIS — E1122 Type 2 diabetes mellitus with diabetic chronic kidney disease: Secondary | ICD-10-CM | POA: Insufficient documentation

## 2016-01-04 DIAGNOSIS — E1140 Type 2 diabetes mellitus with diabetic neuropathy, unspecified: Secondary | ICD-10-CM | POA: Insufficient documentation

## 2016-01-04 DIAGNOSIS — F419 Anxiety disorder, unspecified: Secondary | ICD-10-CM | POA: Insufficient documentation

## 2016-01-04 DIAGNOSIS — F329 Major depressive disorder, single episode, unspecified: Secondary | ICD-10-CM | POA: Insufficient documentation

## 2016-01-04 DIAGNOSIS — Z87442 Personal history of urinary calculi: Secondary | ICD-10-CM | POA: Insufficient documentation

## 2016-01-04 DIAGNOSIS — I129 Hypertensive chronic kidney disease with stage 1 through stage 4 chronic kidney disease, or unspecified chronic kidney disease: Secondary | ICD-10-CM | POA: Insufficient documentation

## 2016-01-04 DIAGNOSIS — C760 Malignant neoplasm of head, face and neck: Secondary | ICD-10-CM | POA: Insufficient documentation

## 2016-01-04 DIAGNOSIS — Z5189 Encounter for other specified aftercare: Secondary | ICD-10-CM | POA: Insufficient documentation

## 2016-02-01 ENCOUNTER — Telehealth (INDEPENDENT_AMBULATORY_CARE_PROVIDER_SITE_OTHER): Payer: Self-pay

## 2016-02-01 NOTE — Telephone Encounter (Signed)
Refill request faxed from pharm. Requesting refill on baclofen '10mg'$  1 po tid. Please advise.

## 2016-02-02 NOTE — Telephone Encounter (Signed)
Faxed denial to pharm to advise.

## 2016-02-02 NOTE — Telephone Encounter (Signed)
U call this is not a prescription that we routinely prescribed and follow-up with. She will need to have her primary care physician ordered this if she still needs it.

## 2016-03-04 ENCOUNTER — Ambulatory Visit (INDEPENDENT_AMBULATORY_CARE_PROVIDER_SITE_OTHER): Payer: Medicaid Other | Admitting: Orthopedic Surgery

## 2016-03-28 ENCOUNTER — Ambulatory Visit (INDEPENDENT_AMBULATORY_CARE_PROVIDER_SITE_OTHER): Payer: Medicaid Other | Admitting: Family

## 2016-03-28 ENCOUNTER — Encounter (INDEPENDENT_AMBULATORY_CARE_PROVIDER_SITE_OTHER): Payer: Self-pay | Admitting: Orthopedic Surgery

## 2016-03-28 VITALS — Ht 66.0 in | Wt 220.0 lb

## 2016-03-28 DIAGNOSIS — Z89512 Acquired absence of left leg below knee: Secondary | ICD-10-CM

## 2016-03-28 DIAGNOSIS — I739 Peripheral vascular disease, unspecified: Secondary | ICD-10-CM | POA: Diagnosis not present

## 2016-03-28 DIAGNOSIS — L97511 Non-pressure chronic ulcer of other part of right foot limited to breakdown of skin: Secondary | ICD-10-CM | POA: Diagnosis not present

## 2016-03-28 MED ORDER — METHOCARBAMOL 500 MG PO TABS: 500.0000 mg | ORAL_TABLET | Freq: Four times a day (QID) | ORAL | 0 refills | Status: DC | PRN

## 2016-03-28 NOTE — Progress Notes (Signed)
Office Visit Note   Patient: [name]           Date of Birth: [birth year]           MRN: [id number] Visit Date: 03/28/2016              Requested by: Kerin Perna, NP [place] [address] [place], [place] [address] PCP: Kerin Perna, NP  Chief Complaint  Patient presents with  . Right Foot - Wound Check    Right great toe blood blister    HPI: Patient presents today with right great toe blood blister. She has no known injury. She states it has been present for 2 weeks now. There is no active drainage but states this has doubled in size. She denies fever or chills but reports she does have fatigue. She also complains of cramping left transtibial amputation. She has tried to do without but states the cramping has been persistent. She request refill on muscle relaxer today. Maxcine Ham, RT  The patient is a 61 year old woman who presents today for evaluation of ulceration to the medial aspect of her right great toe. She has no known injury. Does have diabetes and a history of a low the knee amputation on the left. is concerned about the ulcer just wants to get ahead of things on this.    Assessment & Plan: Visit Diagnoses:  1. Status post below knee amputation of left lower extremity ([place])   2. Right foot ulcer, limited to breakdown of skin ([place])   3. Peripheral vascular disease ([place])     Plan: will cleanse the ulcer daily. Apply Band-Aid with antibacterial ointment. We'll get her set up for ABIs and consider vascular surgery consult pending the results of ABIs. Follow-up in the office in 4 weeks.  Follow-Up Instructions: Return in about 4 weeks (around 04/25/2016).   Ortho Exam Physical Exam  Constitutional: Appears well-developed. Ambulatory in wheelchair today. Him Head: Normocephalic.  Eyes: EOM are normal.  Neck: Normal range of motion.  Cardiovascular: Normal rate.   Pulmonary/Chest: Effort normal.  Neurological: Is alert.  Skin: Skin is warm.    Psychiatric: Has a normal mood and affect. left residual limb is well healed well consolidated wearing a shrinker. Has not yet been casted for her prosthetic. right foot: There is a 3 mm in diameter ulceration to the medial aspect of the great toe. This was debrided back to viable tissue. There is granulation tissue in the wound bed. No surrounding erythema no drainage no odor no sign of infection.  Imaging: No results found.  Orders:  Orders Placed This Encounter  Procedures  . [place] ABI W/WO TBI   Meds ordered this encounter  Medications  . methocarbamol (ROBAXIN) 500 MG tablet    Sig: Take 1 tablet (500 mg total) by mouth every 6 (six) hours as needed for muscle spasms.    Dispense:  120 tablet    Refill:  0     Procedures: No procedures performed  Clinical Data: No additional findings.  Subjective: Review of Systems  Constitutional: Negative for chills and fever.  Skin: Positive for color change and wound.    Objective: Vital Signs: Ht '5\' 6"'$  (1.676 m)   Wt 220 lb (99.8 kg)   BMI 35.51 kg/m   Specialty Comments:  No specialty comments available.  PMFS History: Patient Active Problem List   Diagnosis Date Noted  . Post-operative pain   . Constipation due to pain medication   .  Neuropathic pain   . Muscle spasm   . SIRS (systemic inflammatory response syndrome) ([place]) 09/18/2015  . Labile blood pressure   . DM type 2 with diabetic peripheral neuropathy ([place])   . Abnormality of gait   . Unilateral complete BKA ([place]) 09/15/2015  . Status post below knee amputation of left lower extremity ([place])   . Simple chronic bronchitis ([place])   . Primary osteoarthritis of right knee   . Infection   . Sepsis ([place]) 09/09/2015  . Wound infection 09/09/2015  . 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 Type 2 diabetes mellitus with circulatory disorder ([place])   . Osteomyelitis of foot, acute, left 08/25/2015  . Diabetic foot infection ([place]) 04/19/2015  .  Diabetic foot ulcer ([place]) 04/19/2015  . CKD (chronic kidney disease) 04/19/2015  . Head and neck cancer ([place]) 04/19/2015  . Right hip pain 04/19/2015  . Infection of supraglottis (throat) 09/16/2014  . Incidental lung nodule, less than or equal to 24m 09/16/2014  . Obstructive uropathy 03/22/2014  . COPD (chronic obstructive pulmonary disease) ([place] 03/20/2014  . Controlled type 2 diabetes mellitus with diabetic nephropathy ([facility] 03/20/2014  . CAP (community acquired pneumonia)   . Lice infested hair 014/97/0263 . Tobacco abuse 08/05/2012  . TINEA CORPORIS 09/01/2009  . DEPRESSION 08/11/2009  . COPD 08/11/2009  . Chronic pain syndrome 04/15/2007  . HYPERLIPIDEMIA NEC/NOS 11/02/2006  . Peripheral vascular disease ([place] 11/02/2006  . MORBID OBESITY 10/28/2006  . Obstructive sleep apnea 10/28/2006  . POLYNEUROPATHY 10/28/2006  . Benign essential HTN 10/28/2006  . OSTEOARTHRITIS, KNEES, BILATERAL 10/28/2006  . PROTEINURIA 11/25/2005   Past Medical History:  Diagnosis Date  . Anxiety   . Arthritis   . COPD (chronic obstructive pulmonary disease) ([place]   . Depression   . Diabetes mellitus without complication ([place]    INSULIN DEPENDENT  . Diabetic neuropathy ([facility]   . Difficult intubation    total laryngectomy  . Fibromyalgia   . Head and neck cancer ([facility] 04/19/2015  . Hypertension   . Kidney stone   . Morbid obesity with BMI of 40.0-44.9, adult ([place]   . Neuropathy ([facility]   . Nocturia   . Pneumonia   . Rash, skin    face  . Renal insufficiency   . Shortness of breath   . Sleep apnea    has not used cpap past 10 yrs  . Urge incontinence     Family History  Problem Relation Age of Onset  . Adopted: Yes    Past Surgical History:  Procedure Laterality Date  . AMPUTATION Left 08/25/2015   Procedure: 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;  Surgeon: MNewt Minion MD;  [place]  Service: Orthopedics;  Laterality: Left;  . AMPUTATION Left 09/13/2015   Procedure: AMPUTATION  BELOW KNEE;  Surgeon: MMarybelle Killings MD;  [place]  Service: Orthopedics;  Laterality: Left;  . BRAIN SURGERY     vertebral aneurysm  . BREAST LUMPECTOMY  1982   benign  . CARPEL TUNNEL    . CHOLECYSTECTOMY    . CYSTOSCOPY W/ URETERAL STENT PLACEMENT Left 03/22/2014   Procedure: CYSTOSCOPY WITH RETROGRADE PYELOGRAM/URETERAL STENT PLACEMENT;  Surgeon: TAlexis Frock MD;  [place]  Service: Urology;  Laterality: Left;  . CYSTOSCOPY WITH RETROGRADE PYELOGRAM, URETEROSCOPY AND STENT PLACEMENT Left 05/11/2014   Procedure: CYSTOSCOPY WITH RETROGRADE PYELOGRAM, URETEROSCOPY AND STENT PLACEMENT;  Surgeon: TAlexis Frock MD;  [facility];  Service: Urology;  Laterality: Left;  . HOLMIUM LASER APPLICATION Left 9/64/3838   Procedure: HOLMIUM LASER APPLICATION;  Surgeon: Alexis Frock, MD;  [facility];  Service: Urology;  Laterality: Left;  . LARYNGECTOMY     total ( stoma)  . LEG AMPUTATION BELOW KNEE Left 09/13/2015  . SALIVARY STONE REMOVAL    . TONSILLECTOMY    . TUBAL LIGATION     Social History   Occupational History  . Not on file.   Social History Main Topics  . Smoking status: Former Smoker    Packs/day: 1.00    Years: 40.00    Types: Cigarettes    Quit date: 05/22/2015  . Smokeless tobacco: Never Used  . Alcohol use No  . Drug use: No  . Sexual activity: Not on file

## 2016-03-29 ENCOUNTER — Inpatient Hospital Stay (HOSPITAL_COMMUNITY): Admission: RE | Admit: 2016-03-29 | Payer: Medicaid Other | Source: Ambulatory Visit

## 2016-04-02 ENCOUNTER — Telehealth (INDEPENDENT_AMBULATORY_CARE_PROVIDER_SITE_OTHER): Payer: Self-pay | Admitting: *Deleted

## 2016-04-02 NOTE — Telephone Encounter (Signed)
Authorization [id number] valid 04/02/16-05/02/16 [address] [facility]. I contacted Roxanna Mew at [facility] and is aware of the authorization. She will call pt and get her back on the schedule.

## 2016-04-02 NOTE — Telephone Encounter (Signed)
-----   Message from Suzan Slick, NP sent at 04/02/2016  9:34 AM EST ----- Regarding: medicaid auth Good morning Lashante Fryberger-  This patient needs authorization for an ABI of the Right lower extremity  Once done [facility] reaves with the vascular lab can schedule, her direct line is [phone]   ----- Message ----- From: Rufina Falco Sent: 04/02/2016   8:43 AM To: Suzan Slick, NP  I am cancelling the ABI until we can get authorization for the test.      ----- Message ----- From: Suzan Slick, NP Sent: 03/28/2016   5:10 PM To: Gardiner Fanti Reaves  Do you mean a referral?  ----- Message ----- From: Rufina Falco Sent: 03/28/2016   1:35 PM To: Suzan Slick, NP  I've scheduled her for tomorrow at 1030am.  She has MCAID so we need an authorization.   Patient's care taker is aware.  Rip Harbour ----- Message ----- From: Suzan Slick, NP Sent: 03/28/2016   1:15 PM To: Gardiner Fanti Reaves  Have ordered ABIs for right lower extremity if you could call and schedule her  thanks

## 2016-04-04 ENCOUNTER — Ambulatory Visit (HOSPITAL_COMMUNITY)
Admission: RE | Admit: 2016-04-04 | Discharge: 2016-04-04 | Disposition: A | Discharge status: Home / Self Care | Payer: Medicaid Other | Source: Ambulatory Visit | Attending: Vascular Surgery | Admitting: Vascular Surgery

## 2016-04-04 ENCOUNTER — Encounter (HOSPITAL_COMMUNITY): Payer: Medicaid Other

## 2016-04-04 DIAGNOSIS — I10 Essential (primary) hypertension: Secondary | ICD-10-CM | POA: Diagnosis not present

## 2016-04-04 DIAGNOSIS — E1151 Type 2 diabetes mellitus with diabetic peripheral angiopathy without gangrene: Secondary | ICD-10-CM | POA: Insufficient documentation

## 2016-04-04 DIAGNOSIS — I739 Peripheral vascular disease, unspecified: Secondary | ICD-10-CM | POA: Diagnosis not present

## 2016-04-04 DIAGNOSIS — Z87891 Personal history of nicotine dependence: Secondary | ICD-10-CM | POA: Insufficient documentation

## 2016-04-04 DIAGNOSIS — L97511 Non-pressure chronic ulcer of other part of right foot limited to breakdown of skin: Secondary | ICD-10-CM | POA: Diagnosis not present

## 2016-04-25 ENCOUNTER — Ambulatory Visit (INDEPENDENT_AMBULATORY_CARE_PROVIDER_SITE_OTHER): Payer: Medicaid Other | Admitting: Orthopedic Surgery

## 2016-04-30 ENCOUNTER — Other Ambulatory Visit (INDEPENDENT_AMBULATORY_CARE_PROVIDER_SITE_OTHER): Payer: Self-pay | Admitting: Orthopedic Surgery

## 2016-05-13 ENCOUNTER — Other Ambulatory Visit (INDEPENDENT_AMBULATORY_CARE_PROVIDER_SITE_OTHER): Payer: Self-pay | Admitting: Orthopedic Surgery

## 2016-05-28 ENCOUNTER — Other Ambulatory Visit (INDEPENDENT_AMBULATORY_CARE_PROVIDER_SITE_OTHER): Payer: Self-pay | Admitting: Orthopedic Surgery

## 2016-07-04 ENCOUNTER — Other Ambulatory Visit (INDEPENDENT_AMBULATORY_CARE_PROVIDER_SITE_OTHER): Payer: Self-pay | Admitting: Orthopedic Surgery

## 2017-01-27 ENCOUNTER — Ambulatory Visit (INDEPENDENT_AMBULATORY_CARE_PROVIDER_SITE_OTHER): Payer: Medicaid Other | Admitting: Family

## 2017-01-27 ENCOUNTER — Encounter (INDEPENDENT_AMBULATORY_CARE_PROVIDER_SITE_OTHER): Payer: Self-pay | Admitting: Family

## 2017-01-27 DIAGNOSIS — Z794 Long term (current) use of insulin: Secondary | ICD-10-CM | POA: Diagnosis not present

## 2017-01-27 DIAGNOSIS — L97411 Non-pressure chronic ulcer of right heel and midfoot limited to breakdown of skin: Secondary | ICD-10-CM

## 2017-01-27 DIAGNOSIS — E11621 Type 2 diabetes mellitus with foot ulcer: Secondary | ICD-10-CM | POA: Diagnosis not present

## 2017-01-27 DIAGNOSIS — E1159 Type 2 diabetes mellitus with other circulatory complications: Secondary | ICD-10-CM

## 2017-01-27 MED ORDER — CEPHALEXIN 500 MG PO CAPS
500.0000 mg | ORAL_CAPSULE | Freq: Three times a day (TID) | ORAL | 0 refills | Status: DC
Start: 2017-01-27 — End: 2017-01-27

## 2017-01-27 MED ORDER — CEPHALEXIN 500 MG PO CAPS: 500.0000 mg | ORAL_CAPSULE | Freq: Three times a day (TID) | ORAL | 0 refills | Status: DC

## 2017-01-27 NOTE — Progress Notes (Signed)
Office Visit Note   Patient: [name]           Date of Birth: 04-Oct-1955           MRN: [id number] Visit Date: 01/27/2017              Requested by: Kerin Perna, NP [place], [place] [address] PCP: Kerin Perna, NP  No chief complaint on file.     HPI: The patient is a 61 year old woman seen today as a work in for evaluation of right foot dorsal wound. Thinks she may have had her foot up under her radiator too long. Has had a wound to the first webspace for last 2 weeks. Referred by her PCP. Doing Bactroban dressing changes.  Does have Santyl at home wonders if should use it.   Assessment & Plan: Visit Diagnoses:  1. Type 2 diabetes mellitus with other circulatory complication, with long-term current use of insulin ([place])   2. Diabetic ulcer of right midfoot associated with type 2 diabetes mellitus, limited to breakdown of skin ([place])     Plan: continue daily wound cleansing. May use the Mupirocin or Santyl. Discussed proper use of Santyl. Will call or return with any worsening or concerns.   Follow-Up Instructions: Return in about 2 weeks (around 02/10/2017).   Ortho Exam  Patient is alert, oriented, no adenopathy, well-dressed, normal affect, normal respiratory effort. On examination of right foot has ulceration just proximal to first webspace dorsally, is 15 mm x 6 mm filled in with 50% exudate and 50% eschar. Some surrounding erythema. No ascending cellulitis. No odor or drainage. No sign of infection.   Imaging: No results found. No images are attached to the encounter.  Labs: Lab Results  Component Value Date   HGBA1C 6.0 (H) 08/25/2015   HGBA1C 5.7 (H) 04/20/2015   HGBA1C 11.1 (H) 03/22/2014   ESRSEDRATE 41 (H) 04/19/2015   CRP 7.7 (H) 09/09/2015   CRP 8.0 (H) 04/19/2015   REPTSTATUS 09/10/2015 FINAL 09/09/2015   GRAMSTAIN  03/22/2014    DIRECT SMEAR ABUNDANT WBC PRESENT,BOTH PMN AND MONONUCLEAR RARE GRAM NEGATIVE  RODS    CULT MULTIPLE SPECIES PRESENT, SUGGEST RECOLLECTION (A) 09/09/2015   LABORGA CITROBACTER KOSERI 03/22/2014    @LABSALLVALUES (HGBA1)@  There is no height or weight on file to calculate BMI.  Orders:  No orders of the defined types were placed in this encounter.  No orders of the defined types were placed in this encounter.    Procedures: No procedures performed  Clinical Data: No additional findings.  ROS:  All other systems negative, except as noted in the HPI. Review of Systems  Constitutional: Negative for chills and fever.  Cardiovascular: Positive for leg swelling.  Skin: Positive for color change and wound.    Objective: Vital Signs: There were no vitals taken for this visit.  Specialty Comments:  No specialty comments available.  PMFS History: Patient Active Problem List   Diagnosis Date Noted  . Post-operative pain   . Constipation due to pain medication   . Neuropathic pain   . Muscle spasm   . SIRS (systemic inflammatory response syndrome) ([place]) 09/18/2015  . Labile blood pressure   . DM type 2 with diabetic peripheral neuropathy ([place])   . Abnormality of gait   . Unilateral complete BKA ([place]) 09/15/2015  . Status post below knee amputation of left lower extremity ([place])   . Simple chronic bronchitis ([place])   . Primary  osteoarthritis of right knee   . Infection   . Sepsis ([place]) 09/09/2015  . Wound infection 09/09/2015  . 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 Type 2 diabetes mellitus with circulatory disorder ([place])   . Osteomyelitis of foot, acute, left 08/25/2015  . Diabetic foot infection ([place]) 04/19/2015  . Diabetic foot ulcer ([place]) 04/19/2015  . CKD (chronic kidney disease) 04/19/2015  . Head and neck cancer ([place]) 04/19/2015  . Right hip pain 04/19/2015  . Infection of supraglottis (throat) 09/16/2014  . Incidental lung nodule, less than or equal to 91mm 09/16/2014  . Obstructive uropathy 03/22/2014  . COPD  (chronic obstructive pulmonary disease) ([place]) 03/20/2014  . Controlled type 2 diabetes mellitus with diabetic nephropathy ([place]) 03/20/2014  . CAP (community acquired pneumonia)   . Lice infested hair 43/32/9518  . Tobacco abuse 08/05/2012  . TINEA CORPORIS 09/01/2009  . DEPRESSION 08/11/2009  . COPD 08/11/2009  . Chronic pain syndrome 04/15/2007  . HYPERLIPIDEMIA NEC/NOS 11/02/2006  . Peripheral vascular disease ([place]) 11/02/2006  . MORBID OBESITY 10/28/2006  . Obstructive sleep apnea 10/28/2006  . POLYNEUROPATHY 10/28/2006  . Benign essential HTN 10/28/2006  . OSTEOARTHRITIS, KNEES, BILATERAL 10/28/2006  . PROTEINURIA 11/25/2005   Past Medical History:  Diagnosis Date  . Anxiety   . Arthritis   . COPD (chronic obstructive pulmonary disease) ([place])   . Depression   . Diabetes mellitus without complication ([place])    INSULIN DEPENDENT  . Diabetic neuropathy ([place])   . Difficult intubation    total laryngectomy  . Fibromyalgia   . Head and neck cancer ([place]) 04/19/2015  . Hypertension   . Kidney stone   . Morbid obesity with BMI of 40.0-44.9, adult ([place])   . Neuropathy   . Nocturia   . Pneumonia   . Rash, skin    face  . Renal insufficiency   . Shortness of breath   . Sleep apnea    has not used cpap past 10 yrs  . Urge incontinence     Family History  Adopted: Yes    Past Surgical History:  Procedure Laterality Date  . AMPUTATION Left 08/25/2015   Procedure: 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;  Surgeon: Newt Minion, MD;  Location: [facility];  Service: Orthopedics;  Laterality: Left;  . AMPUTATION Left 09/13/2015   Procedure: AMPUTATION BELOW KNEE;  Surgeon: Marybelle Killings, MD;  Location: [facility];  Service: Orthopedics;  Laterality: Left;  . BRAIN SURGERY     vertebral aneurysm  . BREAST LUMPECTOMY  1982   benign  . CARPEL TUNNEL    . CHOLECYSTECTOMY    . CYSTOSCOPY W/ URETERAL STENT PLACEMENT Left 03/22/2014   Procedure: CYSTOSCOPY WITH RETROGRADE PYELOGRAM/URETERAL STENT  PLACEMENT;  Surgeon: Alexis Frock, MD;  Location: [facility];  Service: Urology;  Laterality: Left;  . CYSTOSCOPY WITH RETROGRADE PYELOGRAM, URETEROSCOPY AND STENT PLACEMENT Left 05/11/2014   Procedure: CYSTOSCOPY WITH RETROGRADE PYELOGRAM, URETEROSCOPY AND STENT PLACEMENT;  Surgeon: Alexis Frock, MD;  [facility];  Service: Urology;  Laterality: Left;  . HOLMIUM LASER APPLICATION Left 8/41/6606   Procedure: HOLMIUM LASER APPLICATION;  Surgeon: Alexis Frock, MD;  [facility];  Service: Urology;  Laterality: Left;  . LARYNGECTOMY     total ( stoma)  . LEG AMPUTATION BELOW KNEE Left 09/13/2015  . SALIVARY STONE REMOVAL    . TONSILLECTOMY    . TUBAL LIGATION     Social History   Occupational History  . Not  on file  Tobacco Use  . Smoking status: Former Smoker    Packs/day: 1.00    Years: 40.00    Pack years: 40.00    Types: Cigarettes    Last attempt to quit: 05/22/2015    Years since quitting: 1.6  . Smokeless tobacco: Never Used  Substance and Sexual Activity  . Alcohol use: No  . Drug use: No  . Sexual activity: Not on file

## 2017-01-27 NOTE — Addendum Note (Signed)
Addended by: Dondra Prader R on: 01/27/2017 03:26 PM   Modules accepted: Orders

## 2017-02-10 ENCOUNTER — Ambulatory Visit (INDEPENDENT_AMBULATORY_CARE_PROVIDER_SITE_OTHER): Payer: Medicaid Other | Admitting: Orthopedic Surgery

## 2017-02-13 ENCOUNTER — Encounter (INDEPENDENT_AMBULATORY_CARE_PROVIDER_SITE_OTHER): Payer: Self-pay | Admitting: Orthopedic Surgery

## 2017-02-13 ENCOUNTER — Ambulatory Visit (INDEPENDENT_AMBULATORY_CARE_PROVIDER_SITE_OTHER): Payer: Medicaid Other | Admitting: Orthopedic Surgery

## 2017-02-13 DIAGNOSIS — L97411 Non-pressure chronic ulcer of right heel and midfoot limited to breakdown of skin: Secondary | ICD-10-CM

## 2017-02-13 DIAGNOSIS — E11621 Type 2 diabetes mellitus with foot ulcer: Secondary | ICD-10-CM | POA: Diagnosis not present

## 2017-02-13 NOTE — Progress Notes (Signed)
Office Visit Note   Patient: [name]           Date of Birth: [birth year]           MRN: [id number] Visit Date: 02/13/2017              Requested by: Kerin Perna, NP [place], [place] [id number] PCP: Kerin Perna, NP  Chief Complaint  Patient presents with  . Right Foot - Follow-up      HPI: The patient is a 61 year old woman seen today in follow up for right foot dorsal wound to the first webspace. Has been doing Santyl dressing changes.   Assessment & Plan: Visit Diagnoses:  1. Unilateral primary osteoarthritis, left hip     Plan: continue daily wound cleansing. May use the Mupirocin, discontinue Santyl. Will call or return with any worsening or concerns.   Follow-Up Instructions: No Follow-up on file.   Ortho Exam  Patient is alert, oriented, no adenopathy, well-dressed, normal affect, normal respiratory effort. On examination of right foot has ulceration just proximal to first webspace dorsally, is much improved. Is 15 mm x 4 mm filled in with 80% granulation. Some surrounding erythema. No ascending cellulitis. No odor or drainage. No sign of infection.   Imaging: No results found. No images are attached to the encounter.  Labs: Lab Results  Component Value Date   HGBA1C 6.0 (H) 08/25/2015   HGBA1C 5.7 (H) 04/20/2015   HGBA1C 11.1 (H) 03/22/2014   ESRSEDRATE 41 (H) 04/19/2015   CRP 7.7 (H) 09/09/2015   CRP 8.0 (H) 04/19/2015   REPTSTATUS 09/10/2015 FINAL 09/09/2015   GRAMSTAIN  03/22/2014    DIRECT SMEAR ABUNDANT WBC PRESENT,BOTH PMN AND MONONUCLEAR RARE GRAM NEGATIVE RODS    CULT MULTIPLE SPECIES PRESENT, SUGGEST RECOLLECTION (A) 09/09/2015   LABORGA CITROBACTER KOSERI 03/22/2014    @LABSALLVALUES (HGBA1)@  There is no height or weight on file to calculate BMI.  Orders:  No orders of the defined types were placed in this encounter.  No orders of the defined types were placed in this encounter.    Procedures: No procedures performed  Clinical Data: No additional findings.  ROS:  All other systems negative, except as noted in the HPI. Review of Systems  Constitutional: Negative for chills and fever.  Cardiovascular: Positive for leg swelling.  Skin: Positive for color change and wound.    Objective: Vital Signs: There were no vitals taken for this visit.  Specialty Comments:  No specialty comments available.  PMFS History: Patient Active Problem List   Diagnosis Date Noted  . Post-operative pain   . Constipation due to pain medication   . Neuropathic pain   . Muscle spasm   . SIRS (systemic inflammatory response syndrome) ([place]) 09/18/2015  . Labile blood pressure   . DM type 2 with diabetic peripheral neuropathy ([place])   . Abnormality of gait   . Unilateral complete BKA ([place]) 09/15/2015  . Status post below knee amputation of left lower extremity ([place])   . Simple chronic bronchitis ([place])   . Primary osteoarthritis of right knee   . Infection   . Sepsis ([place]) 09/09/2015  . Wound infection 09/09/2015  . 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 Type 2 diabetes mellitus with circulatory disorder ([place])   . Osteomyelitis of foot, acute, left 08/25/2015  . Diabetic foot infection ([place]) 04/19/2015  . Diabetic foot ulcer ([place]) 04/19/2015  . CKD (chronic  kidney disease) 04/19/2015  . Head and neck cancer ([place]) 04/19/2015  . Right hip pain 04/19/2015  . Infection of supraglottis (throat) 09/16/2014  . Incidental lung nodule, less than or equal to 103mm 09/16/2014  . Obstructive uropathy 03/22/2014  . COPD (chronic obstructive pulmonary disease) ([place]) 03/20/2014  . Controlled type 2 diabetes mellitus with diabetic nephropathy ([place]) 03/20/2014  . CAP (community acquired pneumonia)   . Lice infested hair 92/42/6834  . Tobacco abuse 08/05/2012  . TINEA CORPORIS 09/01/2009  . DEPRESSION 08/11/2009  . COPD 08/11/2009  . Chronic pain syndrome  04/15/2007  . HYPERLIPIDEMIA NEC/NOS 11/02/2006  . Peripheral vascular disease ([place]) 11/02/2006  . MORBID OBESITY 10/28/2006  . Obstructive sleep apnea 10/28/2006  . POLYNEUROPATHY 10/28/2006  . Benign essential HTN 10/28/2006  . OSTEOARTHRITIS, KNEES, BILATERAL 10/28/2006  . PROTEINURIA 11/25/2005   Past Medical History:  Diagnosis Date  . Anxiety   . Arthritis   . COPD (chronic obstructive pulmonary disease) ([place])   . Depression   . Diabetes mellitus without complication ([place])    INSULIN DEPENDENT  . Diabetic neuropathy ([place])   . Difficult intubation    total laryngectomy  . Fibromyalgia   . Head and neck cancer ([place]) 04/19/2015  . Hypertension   . Kidney stone   . Morbid obesity with BMI of 40.0-44.9, adult ([place])   . Neuropathy   . Nocturia   . Pneumonia   . Rash, skin    face  . Renal insufficiency   . Shortness of breath   . Sleep apnea    has not used cpap past 10 yrs  . Urge incontinence     Family History  Adopted: Yes    Past Surgical History:  Procedure Laterality Date  . AMPUTATION Left 08/25/2015   Procedure: 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;  Surgeon: Newt Minion, MD;  Location: [facility];  Service: Orthopedics;  Laterality: Left;  . AMPUTATION Left 09/13/2015   Procedure: AMPUTATION BELOW KNEE;  Surgeon: Marybelle Killings, MD;  Location: [facility];  Service: Orthopedics;  Laterality: Left;  . BRAIN SURGERY     vertebral aneurysm  . BREAST LUMPECTOMY  1982   benign  . CARPEL TUNNEL    . CHOLECYSTECTOMY    . CYSTOSCOPY W/ URETERAL STENT PLACEMENT Left 03/22/2014   Procedure: CYSTOSCOPY WITH RETROGRADE PYELOGRAM/URETERAL STENT PLACEMENT;  Surgeon: Alexis Frock, MD;  Location: [facility];  Service: Urology;  Laterality: Left;  . CYSTOSCOPY WITH RETROGRADE PYELOGRAM, URETEROSCOPY AND STENT PLACEMENT Left 05/11/2014   Procedure: CYSTOSCOPY WITH RETROGRADE PYELOGRAM, URETEROSCOPY AND STENT PLACEMENT;  Surgeon: Alexis Frock, MD;  [facility];  Service: Urology;   Laterality: Left;  . HOLMIUM LASER APPLICATION Left 1/96/2229   Procedure: HOLMIUM LASER APPLICATION;  Surgeon: Alexis Frock, MD;  [facility];  Service: Urology;  Laterality: Left;  . LARYNGECTOMY     total ( stoma)  . LEG AMPUTATION BELOW KNEE Left 09/13/2015  . SALIVARY STONE REMOVAL    . TONSILLECTOMY    . TUBAL LIGATION     Social History   Occupational History  . Not on file  Tobacco Use  . Smoking status: Former Smoker    Packs/day: 1.00    Years: 40.00    Pack years: 40.00    Types: Cigarettes    Last attempt to quit: 05/22/2015    Years since quitting: 1.7  . Smokeless tobacco: Never Used  Substance and Sexual Activity  . Alcohol use: No  . Drug use: No  .  Sexual activity: Not on file

## 2017-03-11 ENCOUNTER — Ambulatory Visit (INDEPENDENT_AMBULATORY_CARE_PROVIDER_SITE_OTHER): Payer: Medicaid Other | Admitting: Orthopedic Surgery

## 2017-03-13 ENCOUNTER — Ambulatory Visit (INDEPENDENT_AMBULATORY_CARE_PROVIDER_SITE_OTHER): Payer: Medicaid Other | Admitting: Orthopedic Surgery

## 2017-10-09 ENCOUNTER — Institutional Professional Consult (permissible substitution): Payer: Medicaid Other | Admitting: Emergency Medicine

## 2017-10-17 ENCOUNTER — Encounter: Payer: Self-pay | Admitting: Emergency Medicine

## 2017-10-17 ENCOUNTER — Ambulatory Visit: Payer: Medicaid Other | Admitting: Emergency Medicine

## 2017-10-17 DIAGNOSIS — C3490 Malignant neoplasm of unspecified part of unspecified bronchus or lung: Secondary | ICD-10-CM | POA: Diagnosis not present

## 2017-10-17 DIAGNOSIS — C3492 Malignant neoplasm of unspecified part of left bronchus or lung: Secondary | ICD-10-CM | POA: Diagnosis not present

## 2017-10-17 DIAGNOSIS — J449 Chronic obstructive pulmonary disease, unspecified: Secondary | ICD-10-CM | POA: Diagnosis not present

## 2017-10-17 DIAGNOSIS — Z23 Encounter for immunization: Secondary | ICD-10-CM | POA: Diagnosis not present

## 2017-10-17 DIAGNOSIS — C329 Malignant neoplasm of larynx, unspecified: Secondary | ICD-10-CM | POA: Insufficient documentation

## 2017-10-17 MED ORDER — TIOTROPIUM BROMIDE-OLODATEROL 2.5-2.5 MCG/ACT IN AERS: 2.0000 | INHALATION_SPRAY | Freq: Every day | RESPIRATORY_TRACT | 0 refills | Status: DC

## 2017-10-17 NOTE — Addendum Note (Signed)
Addended by: Desmond Dike C on: 10/17/2017 09:53 AM   Modules accepted: Orders

## 2017-10-17 NOTE — Assessment & Plan Note (Signed)
Presumed diagnosis made without biopsy and treated with stereotactic radiation therapy in 2018.  She has not had a repeat CT scan since 09/05/2016 in the [facility] system.  Is been difficult for her to get back to [facility] to follow-up.  There was some question about some radiation pneumonitis and evolving atelectasis on the most recent scan.  I think she needs repeat imaging now we will arrange follow-up with radiation oncology if indicated.

## 2017-10-17 NOTE — Progress Notes (Signed)
Subjective:    Patient ID: [name], female    DOB: [birth year], 62 y.o.   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  Subsequently diagnosed with stage IIa non-small cell lung cancer in the setting of 1.4 cm left upper lobe nodule.  She underwent stereotactic radiation with curative intent 07/09/2016, no biopsy.  Last seen by radiation oncology at [facility] 10/17/2016.  Most recent imaging CT scan of the chest from 09/05/16, report available, showed some atelectatic changes in the left upper lobe with some groundglass consistent with possible radiation pneumonitis.  Also noted was a 9 mm irregular groundglass opacity in the left lung apex that was to be followed.   She is referred today for dyspnea and COPD. Over the last month she has noticed darker mucous, very small bits of blood. Has been improving now, mucous has cleared now. No fever.  Occasional wheeze at night. She uses albuterol, does help the wheeze. She is in a wheelchair, does have some SOB. Her last exacerbation was 2016. She has had pneumovax, never prevnar.    Review of Systems  Constitutional: Negative for fever and unexpected weight change.  HENT: Negative for congestion, dental problem, ear pain, nosebleeds, postnasal drip, rhinorrhea, sinus pressure, sneezing, sore throat and trouble swallowing.   Eyes: Negative for redness and itching.  Respiratory: Positive for cough, chest tightness, shortness of breath and wheezing.   Cardiovascular: Negative for palpitations and leg swelling.  Gastrointestinal: Negative for nausea and vomiting.  Genitourinary: Negative for dysuria.  Musculoskeletal: Negative for joint swelling.  Skin: Negative for rash.  Neurological: Negative for headaches.  Hematological: Does not  bruise/bleed easily.  Psychiatric/Behavioral: Negative for dysphoric mood. The patient is not nervous/anxious.     Past Medical History:  Diagnosis Date  . Anxiety   . Arthritis   . COPD (chronic obstructive pulmonary disease) ([place])   . Depression   . Diabetes mellitus without complication ([place])    INSULIN DEPENDENT  . Diabetic neuropathy ([place])   . Difficult intubation    total laryngectomy  . Fibromyalgia   . Head and neck cancer ([place]) 04/19/2015  . Hypertension   . Kidney stone   . Morbid obesity with BMI of 40.0-44.9, adult ([place])   . Neuropathy   . Nocturia   . Pneumonia   . Rash, skin    face  . Renal insufficiency   . Shortness of breath   . Sleep apnea    has not used cpap past 10 yrs  . Urge incontinence      Family History  Adopted: Yes     Social History   Socioeconomic History  . Marital status: Married    Spouse name: Not on file  . Number of children: Not on file  . Years of education: Not on file  . Highest education level: Not on file  Occupational History  . Not on file  Social Needs  . Financial resource strain: Not on file  . Food insecurity:    Worry: Not on file    Inability: Not on file  . Transportation needs:    Medical: Not on file    Non-medical: Not on file  Tobacco Use  . Smoking status: Former Smoker    Packs/day: 1.00    Years: 40.00    Pack  years: 40.00    Types: Cigarettes    Last attempt to quit: 05/22/2015    Years since quitting: 2.4  . Smokeless tobacco: Never Used  Substance and Sexual Activity  . Alcohol use: No  . Drug use: No  . Sexual activity: Not on file  Lifestyle  . Physical activity:    Days per week: Not on file    Minutes per session: Not on file  . Stress: Not on file  Relationships  . Social connections:    Talks on phone: Not on file    Gets together: Not on file    Attends religious service: Not on file    Active member of club or organization: Not on file    Attends meetings of clubs or  organizations: Not on file    Relationship status: Not on file  . Intimate partner violence:    Fear of current or ex partner: Not on file    Emotionally abused: Not on file    Physically abused: Not on file    Forced sexual activity: Not on file  Other Topics Concern  . Not on file  Social History Narrative  . Not on file     Allergies  Allergen Reactions  . Ace Inhibitors Anaphylaxis and Swelling    Angioedema  . Zestril [Lisinopril] Anaphylaxis and Swelling    Angioedema     Outpatient Medications Prior to Visit  Medication Sig Dispense Refill  . albuterol (PROVENTIL HFA;VENTOLIN HFA) 108 (90 BASE) MCG/ACT inhaler Inhale 2 puffs into the lungs every 4 (four) hours as needed for wheezing or shortness of breath. 18 g 1  . ALPRAZolam (XANAX) 0.5 MG tablet Take 0.5 mg by mouth 3 (three) times daily as needed for anxiety.    Marland Kitchen amLODipine (NORVASC) 10 MG tablet take 1 tablet (10 mg) by oral route once daily 30 tablet 0  . blood glucose meter kit and supplies KIT Dispense based on patient and insurance preference. Use up to four times daily as directed. (FOR ICD-9 250.00, 250.01). 1 each 0  . Buprenorphine HCl-Naloxone HCl (SUBOXONE) 8-2 MG FILM Place 1 Film under the tongue 3 (three) times daily.     . hydrochlorothiazide (HYDRODIURIL) 25 MG tablet Take 25 mg by mouth daily.    Marland Kitchen ibuprofen (ADVIL,MOTRIN) 200 MG tablet Take 400-600 mg by mouth every 6 (six) hours as needed for fever, headache or moderate pain.     Marland Kitchen insulin aspart (NOVOLOG FLEXPEN) 100 UNIT/ML FlexPen Inject 3 Units into the skin 3 (three) times daily with meals.    . Insulin Glargine (LANTUS SOLOSTAR) 100 UNIT/ML Solostar Pen Inject 17 Units into the skin daily. At 10AM 15 mL 3  . Insulin Pen Needle (PEN NEEDLES 3/16") 31G X 5 MM MISC Use as directed. 100 each 3  . Multiple Vitamin (MULTIVITAMIN) tablet Take 1 tablet by mouth daily with supper.     . polyethylene glycol (MIRALAX) packet Take 17 g by mouth daily. 14  each 0  . pregabalin (LYRICA) 50 MG capsule Take 1 capsule (50 mg total) by mouth 3 (three) times daily. 90 capsule 0  . aspirin EC 81 MG EC tablet Take 1 tablet (81 mg total) by mouth daily. 100 tablet 0  . baclofen (LIORESAL) 10 MG tablet Take 0.5 tablets (5 mg total) by mouth 3 (three) times daily. 45 tablet 1  . cephALEXin (KEFLEX) 500 MG capsule Take 1 capsule (500 mg total) by mouth 3 (three) times daily. 30 capsule  0  . docusate sodium (COLACE) 100 MG capsule Take 100 mg by mouth daily as needed for mild constipation.    . methocarbamol (ROBAXIN) 500 MG tablet Take 1 tablet (500 mg total) by mouth every 6 (six) hours as needed for muscle spasms. 60 tablet 0  . oxyCODONE (OXY IR/ROXICODONE) 5 MG immediate release tablet Take 1-2 tablets (5-10 mg total) by mouth every 8 (eight) hours as needed for severe pain or breakthrough pain. 40 tablet 0   No facility-administered medications prior to visit.         Objective:   Physical Exam Vitals:   10/17/17 0907  BP: 140/90  Pulse: 93  SpO2: 97%  Weight: 220 lb (99.8 kg)  Height: 5' 6"  (1.676 m)   Gen: Pleasant, well-nourished, in no distress,  normal affect, uses a wheelchair, can communicate with electronic larynx device  ENT: No lesions,  mouth clear,  oropharynx clear, no postnasal drip  Neck: Stoma is well-appearing without any lesions.  No secretions noted  Lungs: No use of accessory muscles, some soft coarse breath sounds on the left, no wheezing, right is clear  Cardiovascular: RRR, heart sounds normal, no murmur or gallops, no peripheral edema  Musculoskeletal: status post left BKA  Neuro: alert, non focal  Skin: Warm, no lesions or rash      Assessment & Plan:  COPD (chronic obstructive pulmonary disease) ([place]) Severity unclear although she has a significant tobacco history.  It would be quite difficult to perform pulmonary function testing since she had a laryngectomy.  I think would be most reasonable to do a  therapeutic trial of maintenance bronchodilator, see if she benefits.  Currently she is only using albuterol as needed.  We will try Stiolto, see if she benefits.  If so then we will continue.   We will try a new medication called Stiolto.  Take 2 puffs once daily every day as maintenance therapy.  It will probably be easiest for you to do this when your stoma is uncapped. Keep your albuterol available to use 2 puffs up to every 4 hours if needed for shortness of breath, wheezing, chest tightness. If you benefit from the [facility] then we will continue it going forward. Your Pneumovax is up-to-date. You need to get the Prevnar-13 pneumonia shot. You will need the flu shot this fall. Follow with Dr Lamonte Sakai in 1 month or next available   Non-small cell cancer of left lung [facility]) Presumed diagnosis made without biopsy and treated with stereotactic radiation therapy in 2018.  She has not had a repeat CT scan since 09/05/2016 in the [facility] system.  Is been difficult for her to get back to [facility] to follow-up.  There was some question about some radiation pneumonitis and evolving atelectasis on the most recent scan.  I think she needs repeat imaging now we will arrange follow-up with radiation oncology if indicated.  Laryngeal cancer [facility]) Post laryngectomy.  Her stoma looks healthy.  She needs regular follow-up with ENT and we will help her arrange this next time.  Baltazar Apo, MD, PhD 10/17/2017, 9:39 AM [facility] [phone] or if no answer [phone]

## 2017-10-17 NOTE — Assessment & Plan Note (Signed)
Severity unclear although she has a significant tobacco history.  It would be quite difficult to perform pulmonary function testing since she had a laryngectomy.  I think would be most reasonable to do a therapeutic trial of maintenance bronchodilator, see if she benefits.  Currently she is only using albuterol as needed.  We will try Stiolto, see if she benefits.  If so then we will continue.   We will try a new medication called Stiolto.  Take 2 puffs once daily every day as maintenance therapy.  It will probably be easiest for you to do this when your stoma is uncapped. Keep your albuterol available to use 2 puffs up to every 4 hours if needed for shortness of breath, wheezing, chest tightness. If you benefit from the [facility] then we will continue it going forward. Your Pneumovax is up-to-date. You need to get the Prevnar-13 pneumonia shot. You will need the flu shot this fall. Follow with Dr Lamonte Sakai in 1 month or next available

## 2017-10-17 NOTE — Patient Instructions (Signed)
We will try a new medication called Stiolto.  Take 2 puffs once daily every day as maintenance therapy.  It will probably be easiest for you to do this when your stoma is uncapped. Keep your albuterol available to use 2 puffs up to every 4 hours if needed for shortness of breath, wheezing, chest tightness. If you benefit from the [facility] then we will continue it going forward. Your Pneumovax is up-to-date. You need to get the Prevnar-13 pneumonia shot. You will need the flu shot this fall. We will perform a CT scan of the chest without contrast to follow your left upper lobe Follow with Dr Lamonte Sakai in 1 month or next available

## 2017-10-17 NOTE — Assessment & Plan Note (Signed)
Post laryngectomy.  Her stoma looks healthy.  She needs regular follow-up with ENT and we will help her arrange this next time.

## 2017-10-21 ENCOUNTER — Telehealth: Payer: Self-pay | Admitting: Emergency Medicine

## 2017-10-21 NOTE — Telephone Encounter (Signed)
Called patient unable to reach left message to give us a call back.

## 2017-10-23 NOTE — Telephone Encounter (Signed)
Attempted to call pt's husband, Pilar Plate. I did not receive an answer. I have left a message for Pilar Plate to return our call.

## 2017-10-28 ENCOUNTER — Inpatient Hospital Stay: Admission: RE | Admit: 2017-10-28 | Payer: Medicaid Other | Source: Ambulatory Visit

## 2017-10-28 NOTE — Telephone Encounter (Signed)
Attempted to call pt's husband, Pilar Plate. I did not receive an answer. I have left a message for Pilar Plate to return our call.

## 2017-10-29 NOTE — Telephone Encounter (Signed)
We have attempted to contact pt's husband several times with no success or call back from [facility]. Per triage protocol, message will be closed.

## 2017-11-10 ENCOUNTER — Inpatient Hospital Stay: Admission: RE | Admit: 2017-11-10 | Payer: Medicaid Other | Source: Ambulatory Visit

## 2017-11-14 ENCOUNTER — Ambulatory Visit (INDEPENDENT_AMBULATORY_CARE_PROVIDER_SITE_OTHER)
Admission: RE | Admit: 2017-11-14 | Discharge: 2017-11-14 | Disposition: A | Discharge status: Home / Self Care | Payer: Medicaid Other | Source: Ambulatory Visit | Attending: Emergency Medicine | Admitting: Emergency Medicine

## 2017-11-14 DIAGNOSIS — C3490 Malignant neoplasm of unspecified part of unspecified bronchus or lung: Secondary | ICD-10-CM | POA: Diagnosis not present

## 2017-11-18 ENCOUNTER — Ambulatory Visit: Payer: Medicaid Other | Admitting: Emergency Medicine

## 2017-11-28 ENCOUNTER — Ambulatory Visit (INDEPENDENT_AMBULATORY_CARE_PROVIDER_SITE_OTHER): Payer: Medicaid Other | Admitting: Emergency Medicine

## 2017-11-28 ENCOUNTER — Encounter: Payer: Self-pay | Admitting: Emergency Medicine

## 2017-11-28 DIAGNOSIS — J209 Acute bronchitis, unspecified: Secondary | ICD-10-CM

## 2017-11-28 DIAGNOSIS — C3492 Malignant neoplasm of unspecified part of left bronchus or lung: Secondary | ICD-10-CM | POA: Diagnosis not present

## 2017-11-28 DIAGNOSIS — R9389 Abnormal findings on diagnostic imaging of other specified body structures: Secondary | ICD-10-CM | POA: Insufficient documentation

## 2017-11-28 DIAGNOSIS — R918 Other nonspecific abnormal finding of lung field: Secondary | ICD-10-CM

## 2017-11-28 DIAGNOSIS — Z23 Encounter for immunization: Secondary | ICD-10-CM | POA: Diagnosis not present

## 2017-11-28 MED ORDER — DOXYCYCLINE HYCLATE 100 MG PO TABS: 100.0000 mg | ORAL_TABLET | Freq: Two times a day (BID) | ORAL | 0 refills | Status: DC

## 2017-11-28 MED ORDER — TIOTROPIUM BROMIDE-OLODATEROL 2.5-2.5 MCG/ACT IN AERS: 2.0000 | INHALATION_SPRAY | Freq: Every day | RESPIRATORY_TRACT | 5 refills | Status: DC

## 2017-11-28 NOTE — Assessment & Plan Note (Signed)
Postradiation changes seen on her CT scan from 11/14/2017.

## 2017-11-28 NOTE — Progress Notes (Signed)
 Subjective:    Patient ID: [name], female    DOB: 10/27/1955, 62 y.o.   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  Subsequently diagnosed with stage IIa non-small cell lung cancer in the setting of 1.4 cm left upper lobe nodule.  She underwent stereotactic radiation with curative intent 07/09/2016, no biopsy.  Last seen by radiation oncology at [facility] 10/17/2016.  Most recent imaging CT scan of the chest from 09/05/16, report available, showed some atelectatic changes in the left upper lobe with some groundglass consistent with possible radiation pneumonitis.  Also noted was a 9 mm irregular groundglass opacity in the left lung apex that was to be followed.   She is referred today for dyspnea and COPD. Over the last month she has noticed darker mucous, very small bits of blood. Has been improving now, mucous has cleared now. No fever.  Occasional wheeze at night. She uses albuterol, does help the wheeze. She is in a wheelchair, does have some SOB. Her last exacerbation was 2016. She has had pneumovax, never prevnar.   ROV 11/28/17 --[name] follows up today for her history of tobacco use, squamous cell laryngeal cancer (total laryngectomy 04/2015), stage IIa non-small cell lung cancer left upper lobe status post SBRT.  There was some question of possible radiation pneumonitis associated.  Also a 9 mm irregular groundglass left apical opacity that is being followed.  At her last visit we started her on Stiolto empirically to see if she would benefit.  She reports back today that since last time she has URI - led to a an AE-COPD, lots of mucous and some blood-tinged. She did not seek care, feels better but still with purulent mucous, some blood. She feels that Stiolto has been  beneficial. She used albuterol 2-3x a day while she was sick, less now. CT chest from 11/14/17 reviewed by me, shows some left midlung scar and probable radiation change, some peri-fissural nodularity.  There is a new 7 mm right upper lobe pulmonary nodule that will need follow-up.  The left upper lobe groundglass opacity appears to have resolved.. PNA shots up to date. Needs flu shot.    Review of Systems  Constitutional: Negative for fever and unexpected weight change.  HENT: Negative for congestion, dental problem, ear pain, nosebleeds, postnasal drip, rhinorrhea, sinus pressure, sneezing, sore throat and trouble swallowing.   Eyes: Negative for redness and itching.  Respiratory: Positive for cough, chest tightness, shortness of breath and wheezing.   Cardiovascular: Negative for palpitations and leg swelling.  Gastrointestinal: Negative for nausea and vomiting.  Genitourinary: Negative for dysuria.  Musculoskeletal: Negative for joint swelling.  Skin: Negative for rash.  Neurological: Negative for headaches.  Hematological: Does not bruise/bleed easily.  Psychiatric/Behavioral: Negative for dysphoric mood. The patient is not nervous/anxious.     Past Medical History:  Diagnosis Date  . Anxiety   . Arthritis   . COPD (chronic obstructive pulmonary disease) (HCC)   . Depression   . 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  . Diabetic neuropathy (HCC)   . Difficult intubation    total laryngectomy  . Fibromyalgia   . Head and neck cancer (HCC) 04/19/2015  . Hypertension   . Kidney stone   . Morbid obesity with BMI of 40.0-44.9, adult (  HCC)   . Neuropathy   . Nocturia   . Pneumonia   . Rash, skin    face  . Renal insufficiency   . Shortness of breath   . Sleep apnea    has not used cpap past 10 yrs  . Urge incontinence      Family History  Adopted: Yes     Social History   Socioeconomic History  . Marital status: Married    Spouse name: Not  on file  . Number of children: Not on file  . Years of education: Not on file  . Highest education level: Not on file  Occupational History  . Not on file  Social Needs  . Financial resource strain: Not on file  . Food insecurity:    Worry: Not on file    Inability: Not on file  . Transportation needs:    Medical: Not on file    Non-medical: Not on file  Tobacco Use  . Smoking status: Former Smoker    Packs/day: 1.00    Years: 40.00    Pack years: 40.00    Types: Cigarettes    Last attempt to quit: 05/22/2015    Years since quitting: 2.5  . Smokeless tobacco: Never Used  Substance and Sexual Activity  . Alcohol use: No  . Drug use: No  . Sexual activity: Not on file  Lifestyle  . Physical activity:    Days per week: Not on file    Minutes per session: Not on file  . Stress: Not on file  Relationships  . Social connections:    Talks on phone: Not on file    Gets together: Not on file    Attends religious service: Not on file    Active member of club or organization: Not on file    Attends meetings of clubs or organizations: Not on file    Relationship status: Not on file  . Intimate partner violence:    Fear of current or ex partner: Not on file    Emotionally abused: Not on file    Physically abused: Not on file    Forced sexual activity: Not on file  Other Topics Concern  . Not on file  Social History Narrative  . Not on file     Allergies  Allergen Reactions  . Ace Inhibitors Anaphylaxis and Swelling    Angioedema  . Zestril [Lisinopril] Anaphylaxis and Swelling    Angioedema     Outpatient Medications Prior to Visit  Medication Sig Dispense Refill  . albuterol (PROVENTIL HFA;VENTOLIN HFA) 108 (90 BASE) MCG/ACT inhaler Inhale 2 puffs into the lungs every 4 (four) hours as needed for wheezing or shortness of breath. 18 g 1  . amLODipine (NORVASC) 10 MG tablet take 1 tablet (10 mg) by oral route once daily 30 tablet 0  . blood glucose meter kit and  supplies KIT Dispense based on patient and insurance preference. Use up to four times daily as directed. (FOR ICD-9 250.00, 250.01). 1 each 0  . Buprenorphine HCl-Naloxone HCl (SUBOXONE) 8-2 MG FILM Place 1 Film under the tongue 3 (three) times daily.     . hydrochlorothiazide (HYDRODIURIL) 25 MG tablet Take 25 mg by mouth daily.    Marland Kitchen ibuprofen (ADVIL,MOTRIN) 200 MG tablet Take 400-600 mg by mouth every 6 (six) hours as needed for fever, headache or moderate pain.     Marland Kitchen insulin aspart (NOVOLOG FLEXPEN) 100 UNIT/ML FlexPen Inject 3 Units into the skin 3 (three)  times daily with meals.    . Insulin Glargine (LANTUS SOLOSTAR) 100 UNIT/ML Solostar Pen Inject 17 Units into the skin daily. At 10AM 15 mL 3  . Insulin Pen Needle (PEN NEEDLES 3/16") 31G X 5 MM MISC Use as directed. 100 each 3  . Multiple Vitamin (MULTIVITAMIN) tablet Take 1 tablet by mouth daily with supper.     . polyethylene glycol (MIRALAX) packet Take 17 g by mouth daily. 14 each 0  . pregabalin (LYRICA) 50 MG capsule Take 1 capsule (50 mg total) by mouth 3 (three) times daily. 90 capsule 0  . Tiotropium Bromide-Olodaterol (STIOLTO RESPIMAT) 2.5-2.5 MCG/ACT AERS Inhale 2 puffs into the lungs daily. 2 Inhaler 0  . ALPRAZolam (XANAX) 0.5 MG tablet Take 0.5 mg by mouth 3 (three) times daily as needed for anxiety.     No facility-administered medications prior to visit.         Objective:   Physical Exam Vitals:   11/28/17 1004  BP: (!) 158/98  Pulse: 86  SpO2: 94%  Weight: 220 lb (99.8 kg)  Height: 5' 6" (1.676 m)   Gen: Pleasant, well-nourished, in no distress,  normal affect, uses a wheelchair, can communicate with electronic larynx device  ENT: No lesions,  mouth clear,  oropharynx clear, no postnasal drip  Neck: Stoma is well-appearing without any lesions.  No secretions noted  Lungs: No use of accessory muscles, some soft coarse breath sounds on the left, no wheezing, right is clear  Cardiovascular: RRR, heart sounds  normal, no murmur or gallops, no peripheral edema  Musculoskeletal: status post left BKA  Neuro: alert, non focal  Skin: Warm, no lesions or rash      Assessment & Plan:  COPD (chronic obstructive pulmonary disease) ([place]) She benefited from the [employer].  Course since last visit was complicated by a URI and probable exacerbation.  She has improved without steroids.  She probably has an acute bronchitis that will need treatment.  We will continue the Stiolto, use albuterol as needed.  Acute bronchitis In the aftermath of a recent upper respiratory infection.  She still having purulent secretions, some blood-tinged secretions.  I think she needs to be treated with a course of antibiotics to resolve this.  We will give doxycycline for 7 days.  Non-small cell cancer of left lung (HCC) Postradiation changes seen on her CT scan from 11/14/2017.  Abnormal CT of the chest There is some left mid lung scarring from her radiation treatment.  The left apical groundglass opacity has resolved but now there is a new small right upper lobe nodule that will need to be followed.  Next CT scan will be in March 2020.  Baltazar Apo, MD, PhD 11/28/2017, 10:28 AM [facility] [phone] or if no answer [phone]

## 2017-11-28 NOTE — Patient Instructions (Signed)
We will continue Stiolto 2 puffs once daily.  Will call in a prescription to your pharmacy. Keep your albuterol available to use 2 puffs up to every 4 hours if needed for shortness of breath, chest tightness, wheezing. Please take doxycycline 100 mg twice a day for the next 7 days.  Call our office if your cough, mucus production and blood-tinged mucus are not improving. We will plan to repeat your CT scan of the chest in March 2020 to follow your pulmonary nodule. Flu shot today. Your pneumonia shots are up-to-date. Follow with Dr Lamonte Sakai in 6 months or sooner if you have any problems

## 2017-11-28 NOTE — Assessment & Plan Note (Signed)
She benefited from the [employer].  Course since last visit was complicated by a URI and probable exacerbation.  She has improved without steroids.  She probably has an acute bronchitis that will need treatment.  We will continue the Stiolto, use albuterol as needed.

## 2017-11-28 NOTE — Assessment & Plan Note (Signed)
In the aftermath of a recent upper respiratory infection.  She still having purulent secretions, some blood-tinged secretions.  I think she needs to be treated with a course of antibiotics to resolve this.  We will give doxycycline for 7 days.

## 2017-11-28 NOTE — Assessment & Plan Note (Signed)
There is some left mid lung scarring from her radiation treatment.  The left apical groundglass opacity has resolved but now there is a new small right upper lobe nodule that will need to be followed.  Next CT scan will be in March 2020.

## 2018-05-18 ENCOUNTER — Inpatient Hospital Stay: Admission: RE | Admit: 2018-05-18 | Payer: Medicaid Other | Source: Ambulatory Visit

## 2018-05-27 ENCOUNTER — Ambulatory Visit: Payer: Medicaid Other | Admitting: Emergency Medicine

## 2018-06-15 ENCOUNTER — Encounter (HOSPITAL_COMMUNITY): Payer: Self-pay | Admitting: *Deleted

## 2018-06-15 ENCOUNTER — Emergency Department (HOSPITAL_COMMUNITY): Payer: Medicaid Other

## 2018-06-15 ENCOUNTER — Other Ambulatory Visit: Payer: Self-pay

## 2018-06-15 ENCOUNTER — Inpatient Hospital Stay (HOSPITAL_COMMUNITY)
Admission: EM | Admit: 2018-06-15 | Discharge: 2018-06-18 | DRG: 872 | Disposition: A | Discharge status: Home / Self Care | Payer: Medicaid Other | Attending: Internal Medicine | Admitting: Internal Medicine

## 2018-06-15 DIAGNOSIS — Z8521 Personal history of malignant neoplasm of larynx: Secondary | ICD-10-CM | POA: Diagnosis not present

## 2018-06-15 DIAGNOSIS — T8744 Infection of amputation stump, left lower extremity: Secondary | ICD-10-CM | POA: Diagnosis present

## 2018-06-15 DIAGNOSIS — E1140 Type 2 diabetes mellitus with diabetic neuropathy, unspecified: Secondary | ICD-10-CM | POA: Diagnosis present

## 2018-06-15 DIAGNOSIS — Z87891 Personal history of nicotine dependence: Secondary | ICD-10-CM

## 2018-06-15 DIAGNOSIS — R0602 Shortness of breath: Secondary | ICD-10-CM | POA: Diagnosis not present

## 2018-06-15 DIAGNOSIS — M797 Fibromyalgia: Secondary | ICD-10-CM | POA: Diagnosis present

## 2018-06-15 DIAGNOSIS — G894 Chronic pain syndrome: Secondary | ICD-10-CM | POA: Diagnosis present

## 2018-06-15 DIAGNOSIS — B9620 Unspecified Escherichia coli [E. coli] as the cause of diseases classified elsewhere: Secondary | ICD-10-CM | POA: Diagnosis present

## 2018-06-15 DIAGNOSIS — Z9851 Tubal ligation status: Secondary | ICD-10-CM

## 2018-06-15 DIAGNOSIS — J449 Chronic obstructive pulmonary disease, unspecified: Secondary | ICD-10-CM | POA: Diagnosis present

## 2018-06-15 DIAGNOSIS — Z89512 Acquired absence of left leg below knee: Secondary | ICD-10-CM

## 2018-06-15 DIAGNOSIS — I10 Essential (primary) hypertension: Secondary | ICD-10-CM | POA: Diagnosis present

## 2018-06-15 DIAGNOSIS — Z20828 Contact with and (suspected) exposure to other viral communicable diseases: Secondary | ICD-10-CM | POA: Diagnosis present

## 2018-06-15 DIAGNOSIS — Z79899 Other long term (current) drug therapy: Secondary | ICD-10-CM | POA: Diagnosis not present

## 2018-06-15 DIAGNOSIS — Z794 Long term (current) use of insulin: Secondary | ICD-10-CM

## 2018-06-15 DIAGNOSIS — Z888 Allergy status to other drugs, medicaments and biological substances status: Secondary | ICD-10-CM

## 2018-06-15 DIAGNOSIS — M1990 Unspecified osteoarthritis, unspecified site: Secondary | ICD-10-CM | POA: Diagnosis present

## 2018-06-15 DIAGNOSIS — Z87892 Personal history of anaphylaxis: Secondary | ICD-10-CM

## 2018-06-15 DIAGNOSIS — A419 Sepsis, unspecified organism: Secondary | ICD-10-CM | POA: Diagnosis present

## 2018-06-15 DIAGNOSIS — R509 Fever, unspecified: Secondary | ICD-10-CM

## 2018-06-15 DIAGNOSIS — L03116 Cellulitis of left lower limb: Secondary | ICD-10-CM | POA: Diagnosis present

## 2018-06-15 DIAGNOSIS — Z89432 Acquired absence of left foot: Secondary | ICD-10-CM

## 2018-06-15 DIAGNOSIS — Z9221 Personal history of antineoplastic chemotherapy: Secondary | ICD-10-CM | POA: Diagnosis not present

## 2018-06-15 DIAGNOSIS — E1151 Type 2 diabetes mellitus with diabetic peripheral angiopathy without gangrene: Secondary | ICD-10-CM | POA: Diagnosis present

## 2018-06-15 DIAGNOSIS — N390 Urinary tract infection, site not specified: Secondary | ICD-10-CM | POA: Diagnosis present

## 2018-06-15 DIAGNOSIS — R197 Diarrhea, unspecified: Secondary | ICD-10-CM | POA: Diagnosis present

## 2018-06-15 DIAGNOSIS — N179 Acute kidney failure, unspecified: Secondary | ICD-10-CM | POA: Diagnosis present

## 2018-06-15 DIAGNOSIS — R0902 Hypoxemia: Secondary | ICD-10-CM

## 2018-06-15 DIAGNOSIS — Z923 Personal history of irradiation: Secondary | ICD-10-CM

## 2018-06-15 DIAGNOSIS — Z8701 Personal history of pneumonia (recurrent): Secondary | ICD-10-CM

## 2018-06-15 DIAGNOSIS — T8789 Other complications of amputation stump: Secondary | ICD-10-CM | POA: Diagnosis not present

## 2018-06-15 DIAGNOSIS — Z85118 Personal history of other malignant neoplasm of bronchus and lung: Secondary | ICD-10-CM

## 2018-06-15 DIAGNOSIS — M549 Dorsalgia, unspecified: Secondary | ICD-10-CM | POA: Diagnosis present

## 2018-06-15 DIAGNOSIS — I4510 Unspecified right bundle-branch block: Secondary | ICD-10-CM | POA: Diagnosis not present

## 2018-06-15 DIAGNOSIS — Z87442 Personal history of urinary calculi: Secondary | ICD-10-CM

## 2018-06-15 DIAGNOSIS — E876 Hypokalemia: Secondary | ICD-10-CM | POA: Diagnosis present

## 2018-06-15 DIAGNOSIS — G4733 Obstructive sleep apnea (adult) (pediatric): Secondary | ICD-10-CM | POA: Diagnosis present

## 2018-06-15 DIAGNOSIS — Z9049 Acquired absence of other specified parts of digestive tract: Secondary | ICD-10-CM

## 2018-06-15 DIAGNOSIS — I440 Atrioventricular block, first degree: Secondary | ICD-10-CM | POA: Diagnosis not present

## 2018-06-15 DIAGNOSIS — Z9002 Acquired absence of larynx: Secondary | ICD-10-CM | POA: Diagnosis not present

## 2018-06-15 DIAGNOSIS — R000 Tachycardia, unspecified: Secondary | ICD-10-CM | POA: Diagnosis not present

## 2018-06-15 DIAGNOSIS — Z87898 Personal history of other specified conditions: Secondary | ICD-10-CM

## 2018-06-15 LAB — CBC WITH DIFFERENTIAL/PLATELET
Abs Immature Granulocytes: 0.15 10*3/uL — ABNORMAL HIGH (ref 0.00–0.07)
Basophils Absolute: 0.1 10*3/uL (ref 0.0–0.1)
Basophils Relative: 1 %
Eosinophils Absolute: 0.1 10*3/uL (ref 0.0–0.5)
Eosinophils Relative: 1 %
HCT: 48.4 % — ABNORMAL HIGH (ref 36.0–46.0)
Hemoglobin: 16.1 g/dL — ABNORMAL HIGH (ref 12.0–15.0)
Immature Granulocytes: 1 %
Lymphocytes Relative: 5 %
Lymphs Abs: 0.6 10*3/uL — ABNORMAL LOW (ref 0.7–4.0)
MCH: 29.5 pg (ref 26.0–34.0)
MCHC: 33.3 g/dL (ref 30.0–36.0)
MCV: 88.8 fL (ref 80.0–100.0)
Monocytes Absolute: 0.3 10*3/uL (ref 0.1–1.0)
Monocytes Relative: 2 %
Neutro Abs: 12.1 10*3/uL — ABNORMAL HIGH (ref 1.7–7.7)
Neutrophils Relative %: 90 %
Platelets: 178 10*3/uL (ref 150–400)
RBC: 5.45 MIL/uL — ABNORMAL HIGH (ref 3.87–5.11)
RDW: 12.9 % (ref 11.5–15.5)
WBC: 13.2 10*3/uL — ABNORMAL HIGH (ref 4.0–10.5)
nRBC: 0 % (ref 0.0–0.2)

## 2018-06-15 LAB — BASIC METABOLIC PANEL
Anion gap: 13 (ref 5–15)
BUN: 21 mg/dL — ABNORMAL HIGH (ref 6–20)
CO2: 25 mmol/L (ref 22–32)
Calcium: 8.4 mg/dL — ABNORMAL LOW (ref 8.9–10.3)
Chloride: 101 mmol/L (ref 98–111)
Creatinine, Ser: 1.26 mg/dL — ABNORMAL HIGH (ref 0.44–1.00)
GFR calc Af Amer: 54 mL/min — ABNORMAL LOW (ref >60)
GFR calc non Af Amer: 46 mL/min — ABNORMAL LOW (ref >60)
Glucose, Bld: 149 mg/dL — ABNORMAL HIGH (ref 70–99)
Potassium: 3 mmol/L — ABNORMAL LOW (ref 3.5–5.1)
Sodium: 139 mmol/L (ref 135–145)

## 2018-06-15 LAB — URINALYSIS, ROUTINE W REFLEX MICROSCOPIC
Bilirubin Urine: NEGATIVE
Glucose, UA: NEGATIVE mg/dL
Ketones, ur: NEGATIVE mg/dL
Nitrite: POSITIVE — AB
Protein, ur: NEGATIVE mg/dL
Specific Gravity, Urine: 1.018 (ref 1.005–1.030)
pH: 5 (ref 5.0–8.0)

## 2018-06-15 LAB — SARS CORONAVIRUS 2 BY RT PCR (HOSPITAL ORDER, PERFORMED IN ~~LOC~~ HOSPITAL LAB): SARS Coronavirus 2: NEGATIVE

## 2018-06-15 LAB — COMPREHENSIVE METABOLIC PANEL
ALT: 15 U/L (ref 0–44)
AST: 22 U/L (ref 15–41)
Albumin: 3.3 g/dL — ABNORMAL LOW (ref 3.5–5.0)
Alkaline Phosphatase: 75 U/L (ref 38–126)
Anion gap: 16 — ABNORMAL HIGH (ref 5–15)
BUN: 21 mg/dL — ABNORMAL HIGH (ref 6–20)
CO2: 22 mmol/L (ref 22–32)
Calcium: 8.8 mg/dL — ABNORMAL LOW (ref 8.9–10.3)
Chloride: 99 mmol/L (ref 98–111)
Creatinine, Ser: 1.5 mg/dL — ABNORMAL HIGH (ref 0.44–1.00)
GFR calc Af Amer: 43 mL/min — ABNORMAL LOW (ref >60)
GFR calc non Af Amer: 37 mL/min — ABNORMAL LOW (ref >60)
Glucose, Bld: 186 mg/dL — ABNORMAL HIGH (ref 70–99)
Potassium: 2.6 mmol/L — CL (ref 3.5–5.1)
Sodium: 137 mmol/L (ref 135–145)
Total Bilirubin: 0.7 mg/dL (ref 0.3–1.2)
Total Protein: 6.5 g/dL (ref 6.5–8.1)

## 2018-06-15 LAB — TROPONIN I
Troponin I: 0.07 ng/mL (ref <0.03)
Troponin I: 0.11 ng/mL (ref <0.03)
Troponin I: 0.03 ng/mL (ref <0.03)

## 2018-06-15 LAB — PROCALCITONIN: Procalcitonin: 7.47 ng/mL

## 2018-06-15 LAB — GLUCOSE, CAPILLARY
Glucose-Capillary: 147 mg/dL — ABNORMAL HIGH (ref 70–99)
Glucose-Capillary: 148 mg/dL — ABNORMAL HIGH (ref 70–99)
Glucose-Capillary: 89 mg/dL (ref 70–99)

## 2018-06-15 LAB — FERRITIN: Ferritin: 170 ng/mL (ref 11–307)

## 2018-06-15 LAB — LACTIC ACID, PLASMA: Lactic Acid, Venous: 2.2 mmol/L (ref 0.5–1.9)

## 2018-06-15 LAB — TRIGLYCERIDES: Triglycerides: 85 mg/dL (ref <150)

## 2018-06-15 LAB — C-REACTIVE PROTEIN: CRP: 19.7 mg/dL — ABNORMAL HIGH (ref <1.0)

## 2018-06-15 LAB — MAGNESIUM: Magnesium: 1.7 mg/dL (ref 1.7–2.4)

## 2018-06-15 LAB — BRAIN NATRIURETIC PEPTIDE: B Natriuretic Peptide: 300 pg/mL — ABNORMAL HIGH (ref 0.0–100.0)

## 2018-06-15 LAB — LACTATE DEHYDROGENASE: LDH: 184 U/L (ref 98–192)

## 2018-06-15 LAB — FIBRINOGEN: Fibrinogen: 705 mg/dL — ABNORMAL HIGH (ref 210–475)

## 2018-06-15 MED ORDER — POTASSIUM CHLORIDE 20 MEQ PO PACK
40.0000 meq | PACK | ORAL | Status: AC
  Administered 2018-06-15: 23:00:00 40 meq via ORAL
  Filled 2018-06-15 (×2): qty 2

## 2018-06-15 MED ORDER — SODIUM CHLORIDE 0.9 % IV BOLUS
500.0000 mL | Freq: Once | INTRAVENOUS | Status: AC
  Administered 2018-06-15: 12:00:00 500 mL via INTRAVENOUS

## 2018-06-15 MED ORDER — ALBUTEROL SULFATE HFA 108 (90 BASE) MCG/ACT IN AERS
2.0000 | INHALATION_SPRAY | RESPIRATORY_TRACT | Status: DC | PRN
  Filled 2018-06-15: qty 6.7

## 2018-06-15 MED ORDER — BUPRENORPHINE HCL-NALOXONE HCL 8-2 MG SL SUBL
1.0000 | SUBLINGUAL_TABLET | Freq: Three times a day (TID) | SUBLINGUAL | Status: DC
  Administered 2018-06-15 – 2018-06-16 (×5): 1 via SUBLINGUAL
  Filled 2018-06-15 (×5): qty 1

## 2018-06-15 MED ORDER — METRONIDAZOLE IN NACL 5-0.79 MG/ML-% IV SOLN
500.0000 mg | Freq: Three times a day (TID) | INTRAVENOUS | Status: DC
  Administered 2018-06-15 – 2018-06-17 (×5): 500 mg via INTRAVENOUS
  Filled 2018-06-15 (×5): qty 100

## 2018-06-15 MED ORDER — SODIUM CHLORIDE 0.9 % IV BOLUS
500.0000 mL | Freq: Once | INTRAVENOUS | Status: AC
  Administered 2018-06-15: 500 mL via INTRAVENOUS

## 2018-06-15 MED ORDER — FENTANYL CITRATE (PF) 100 MCG/2ML IJ SOLN
50.0000 ug | Freq: Once | INTRAMUSCULAR | Status: AC
  Administered 2018-06-15: 10:00:00 50 ug via INTRAVENOUS
  Filled 2018-06-15: qty 2

## 2018-06-15 MED ORDER — VANCOMYCIN HCL IN DEXTROSE 1-5 GM/200ML-% IV SOLN
1000.0000 mg | Freq: Once | INTRAVENOUS | Status: DC
  Filled 2018-06-15: qty 200

## 2018-06-15 MED ORDER — SODIUM CHLORIDE 0.9 % IV SOLN
2.0000 g | Freq: Two times a day (BID) | INTRAVENOUS | Status: DC
  Administered 2018-06-15 – 2018-06-17 (×4): 2 g via INTRAVENOUS
  Filled 2018-06-15 (×6): qty 2

## 2018-06-15 MED ORDER — INSULIN GLARGINE 100 UNIT/ML ~~LOC~~ SOLN
10.0000 [IU] | Freq: Every day | SUBCUTANEOUS | Status: DC
  Administered 2018-06-15 – 2018-06-17 (×3): 10 [IU] via SUBCUTANEOUS
  Filled 2018-06-15 (×6): qty 0.1

## 2018-06-15 MED ORDER — POTASSIUM CHLORIDE 2 MEQ/ML IV SOLN
INTRAVENOUS | Status: AC
  Administered 2018-06-15: 17:00:00 via INTRAVENOUS
  Filled 2018-06-15: qty 1000

## 2018-06-15 MED ORDER — VANCOMYCIN HCL 10 G IV SOLR
1500.0000 mg | Freq: Once | INTRAVENOUS | Status: AC
  Administered 2018-06-15: 1500 mg via INTRAVENOUS
  Filled 2018-06-15: qty 1500

## 2018-06-15 MED ORDER — BUPRENORPHINE HCL-NALOXONE HCL 8-2 MG SL FILM: 1.0000 | ORAL_FILM | Freq: Three times a day (TID) | SUBLINGUAL | Status: DC

## 2018-06-15 MED ORDER — UMECLIDINIUM BROMIDE 62.5 MCG/INH IN AEPB
1.0000 | INHALATION_SPRAY | Freq: Every day | RESPIRATORY_TRACT | Status: DC
  Filled 2018-06-15: qty 7

## 2018-06-15 MED ORDER — POTASSIUM CHLORIDE 20 MEQ PO PACK
40.0000 meq | PACK | Freq: Two times a day (BID) | ORAL | Status: DC
  Administered 2018-06-15: 17:00:00 40 meq via ORAL
  Filled 2018-06-15 (×3): qty 2

## 2018-06-15 MED ORDER — SENNOSIDES-DOCUSATE SODIUM 8.6-50 MG PO TABS: 1.0000 | ORAL_TABLET | Freq: Every evening | ORAL | Status: DC | PRN

## 2018-06-15 MED ORDER — PREGABALIN 25 MG PO CAPS
50.0000 mg | ORAL_CAPSULE | Freq: Three times a day (TID) | ORAL | Status: DC
  Administered 2018-06-15 – 2018-06-17 (×4): 50 mg via ORAL
  Filled 2018-06-15 (×9): qty 2

## 2018-06-15 MED ORDER — HEPARIN SODIUM (PORCINE) 5000 UNIT/ML IJ SOLN
5000.0000 [IU] | Freq: Three times a day (TID) | INTRAMUSCULAR | Status: DC
  Administered 2018-06-15 – 2018-06-18 (×8): 5000 [IU] via SUBCUTANEOUS
  Filled 2018-06-15 (×8): qty 1

## 2018-06-15 MED ORDER — SODIUM CHLORIDE 0.9 % IV SOLN
2.0000 g | Freq: Once | INTRAVENOUS | Status: AC
  Administered 2018-06-15: 13:00:00 2 g via INTRAVENOUS
  Filled 2018-06-15: qty 2

## 2018-06-15 MED ORDER — ACETAMINOPHEN 325 MG PO TABS
650.0000 mg | ORAL_TABLET | Freq: Once | ORAL | Status: AC
  Administered 2018-06-15: 650 mg via ORAL
  Filled 2018-06-15: qty 2

## 2018-06-15 MED ORDER — INSULIN ASPART 100 UNIT/ML ~~LOC~~ SOLN
0.0000 [IU] | Freq: Three times a day (TID) | SUBCUTANEOUS | Status: DC
  Administered 2018-06-15 – 2018-06-18 (×6): 3 [IU] via SUBCUTANEOUS

## 2018-06-15 MED ORDER — ACETAMINOPHEN 650 MG RE SUPP: 650.0000 mg | Freq: Four times a day (QID) | RECTAL | Status: DC | PRN

## 2018-06-15 MED ORDER — METRONIDAZOLE IN NACL 5-0.79 MG/ML-% IV SOLN
500.0000 mg | Freq: Once | INTRAVENOUS | Status: AC
  Administered 2018-06-15: 500 mg via INTRAVENOUS
  Filled 2018-06-15: qty 100

## 2018-06-15 MED ORDER — ACETAMINOPHEN 325 MG PO TABS
650.0000 mg | ORAL_TABLET | Freq: Four times a day (QID) | ORAL | Status: DC | PRN
  Administered 2018-06-16: 05:00:00 650 mg via ORAL
  Filled 2018-06-15: qty 2

## 2018-06-15 MED ORDER — ARFORMOTEROL TARTRATE 15 MCG/2ML IN NEBU
15.0000 ug | INHALATION_SOLUTION | Freq: Two times a day (BID) | RESPIRATORY_TRACT | Status: DC
  Administered 2018-06-16 – 2018-06-18 (×5): 15 ug via RESPIRATORY_TRACT
  Filled 2018-06-15 (×7): qty 2

## 2018-06-15 MED ORDER — POTASSIUM CHLORIDE 10 MEQ/100ML IV SOLN
10.0000 meq | INTRAVENOUS | Status: AC
  Administered 2018-06-15: 12:00:00 10 meq via INTRAVENOUS
  Filled 2018-06-15: qty 100

## 2018-06-15 MED ORDER — VANCOMYCIN HCL 10 G IV SOLR
1250.0000 mg | INTRAVENOUS | Status: DC
  Administered 2018-06-16: 1250 mg via INTRAVENOUS
  Filled 2018-06-15 (×2): qty 1250

## 2018-06-15 NOTE — Discharge Summary (Signed)
Name: [name] MRN: [id number] DOB: [birth year] 63 y.o. PCP: Kerin Perna, NP  Date of Admission: 06/15/2018  8:15 AM Date of Discharge: 06/18/2018 Attending Physician: Bartholomew Crews, MD  Discharge Diagnosis: 1.  Urosepsis 2.  Nonpurulent left lower extremity cellulitis 3.  Hypertension 4.  Insulin-dependent type 2 diabetes mellitus 5.  COPD 6.  Acute kidney injury 7.  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  History of stage IIa non-small cell lung cancer 9.  Chronic pain syndrome 10.  Fibromyalgia  Discharge Medications: Allergies as of 06/18/2018      Reactions   Ace Inhibitors Anaphylaxis, Swelling   Angioedema   Zestril [lisinopril] Anaphylaxis, Swelling   Angioedema      Medication List    STOP taking these medications   doxycycline 100 MG tablet Commonly known as:  VIBRA-TABS     TAKE these medications   albuterol 108 (90 Base) MCG/ACT inhaler Commonly known as:  VENTOLIN HFA Inhale 2 puffs into the lungs every 4 (four) hours as needed for wheezing or shortness of breath.   amLODipine 10 MG tablet Commonly known as:  NORVASC take 1 tablet (10 mg) by oral route once daily What changed:  See the new instructions.   blood glucose meter kit and supplies Kit Dispense based on patient and insurance preference. Use up to four times daily as directed. (FOR ICD-9 250.00, 250.01).   cephALEXin 500 MG capsule Commonly known as:  KEFLEX Take 1 capsule (500 mg total) by mouth every 8 (eight) hours.   hydrochlorothiazide 25 MG tablet Commonly known as:  HYDRODIURIL Take 25 mg by mouth daily.   ibuprofen 200 MG tablet Commonly known as:  ADVIL Take 400-600 mg by mouth every 6 (six) hours as needed for fever, headache or moderate pain.   Insulin Glargine 100 UNIT/ML Solostar Pen Commonly known as:  Lantus SoloStar Inject 17 Units into the skin daily. At 10AM   multivitamin tablet Take 1 tablet by mouth daily  with supper.   NovoLOG FlexPen 100 UNIT/ML FlexPen Generic drug:  insulin aspart Inject 3 Units into the skin 3 (three) times daily with meals.   Pen Needles 3/16" 31G X 5 MM Misc Use as directed.   polyethylene glycol 17 g packet Commonly known as:  MiraLax Take 17 g by mouth daily.   pregabalin 50 MG capsule Commonly known as:  LYRICA Take 1 capsule (50 mg total) by mouth 3 (three) times daily. What changed:    how much to take  when to take this   Suboxone 8-2 MG Film Generic drug:  Buprenorphine HCl-Naloxone HCl Place 1 Film under the tongue 3 (three) times daily.   Tiotropium Bromide-Olodaterol 2.5-2.5 MCG/ACT Aers Commonly known as:  Stiolto Respimat Inhale 2 puffs into the lungs daily.       Disposition and follow-up:   [name] was discharged from [facility] in [place] condition.  At the hospital follow up visit please address:  1.  Urosepsis: Please ensure completion of Keflex 500 mg TID (Stop date 06/24/2018)      Nonpurulent left lower extremity cellulitis: Please ensure completion of Keflex 500 mg TID (Stop date 06/24/2018)  2.  Labs / imaging needed at time of follow-up: None  3.  Pending labs/ test needing follow-up: None   Follow-up Appointments:   Hospital Course by problem list:  1.  Urosepsis: Ms. 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  Per arrival to the emergency department she was febrile to 102, tachycardic, tachypneic and initially hypoxic to 87%.  Her labs  reviewed leukocytosis of 13.2 with neutrophil predominance, elevated procalcitonin of 7.4, acidemia with lactic acid of 2.2, COVID-19 PCR was unremarkable, fibrinogen was elevated, CRP was also elevated as well most likely secondary to acute phase reactant from her presenting systemic infection.  Urinalysis revealed pyuria with positive nitrite, leukocyte and small hemoglobin however she denies urinary symptoms.  Urine culture subsequently grew pansensitive E. coli.  Blood culture remained unremarkable.  She was initially treated with spectrum antibiotics with IV vancomycin, cefepime and Flagyl which was subsequently narrowed to IV ceftriaxone and eventually Keflex at discharge.  She was given a 7-day course of Keflex 500 mg 3 times daily for 7 days.  2. Dyspnea; Mild troponinemia: Given her initially objective findings of hypoxia with oxygen saturation of 87% on room air and initially elevated BNP of 300 and troponin of 0.07, we were concerned about ACS however this was unlikely and troponin elevation was due to type II demand ischemia.  In addition, her chest x-ray did not show evidence of pulmonary vascular congestion and also did not appear volume overloaded thus making heart failure less likely.  Her dyspnea was most likely due to COPD.  3. Left lower extremity cellulitis:  She has a history of insulin-dependent mellitus, peripheral arterial disease status post left BKA.  On initial evaluation, her left lower extremity was noted to be erythematous, mildly warm to touch however nontender to palpation.  Given that she received broad-spectrum antibiotics with IV vancomycin, cefepime and Flagyl she had a good response to her cellulitis.  She was discharged on a 7-day course of Keflex 500 mg 3 times daily.  4. Acute kidney injury:  Arrival her BMP revealed serum creatinine of 1.5 from baseline of 1.1 which was secondary to dehydration.  This improved with IV fluid resuscitation and at discharge serum  creatinine was 0.9  5.  Hypokalemia:  Received oral repletion  6.  Diarrhea:  Prior to arrival she reported of a 2-day history of loose stools however during she did not have any subsequent episodes of diarrhea.  She also denied any sick contacts making viral or bacterial gastroenteritis less likely.  7. Hypertension: Pressure remained stable and we held home amlodipine and resumed at discharge  8. nsulin-dependent type 2 diabetes mellitus:She was managed with  Lantus 10 units qhs, sliding scale insulin  9. COPD: She currently carries a diagnosis of COPD however no official PFTs has been performed.  We continued inhalers during her admission.  10. History of stage II squamous cell carcinoma of supraglottic larynx status post total laryngectomy  11. History of stage IIa non-small cell lung cancer status post stereotactic radiation: her last follow-up with radiation oncology was at [facility] in 2018.  12.Chronic pain syndrome 13.Fibromyalgia We continued her home Lyrica and Suboxone  Discharge Vitals:   BP 128/73 (BP Location: Left Arm)   Pulse 82   Temp 98.7 F (37.1 C) (Oral)   Resp 18   Ht 5' 5"  (1.651 m)  Wt 104.3 kg   SpO2 92%   BMI 38.26 kg/m   Pertinent Labs, Studies, and Procedures:  Chest x-ray IMPRESSION: Slight left perihilar atelectasis. No edema or consolidation. Stable cardiac prominence. Apparent tracheostomy positioned at the C7-T1 level. No evident pneumothorax.  Chest Xray FINDINGS: Stable cardiomediastinal silhouette. No pneumothorax or pleural effusion is noted. Tracheostomy is not definitively visualized currently. Both lungs are clear. The visualized skeletal structures are unremarkable.  IMPRESSION: No active disease.  Discharge Instructions: Discharge Instructions    Call MD for:  difficulty breathing, headache or visual disturbances   Complete by:  As directed    Call MD for:  redness, tenderness, or signs of infection (pain,  swelling, redness, odor or green/yellow discharge around incision site)   Complete by:  As directed    Diet - low sodium heart healthy   Complete by:  As directed    Discharge instructions   Complete by:  As directed    Ms. Hymas,   It was a pleasure taking care of you here at the hospital during your course.  You were admitted because of of a urinary tract infection and an infection in your left leg which had overwhelmed your body..  We treated you with antibiotics and fluids which she responded very well to it.  I am discharging you with an antibiotic called Keflex.  You will take 1 pill twice a day for the next 7 days.  I would like for you to follow-up with your primary care doctor as well.  Take care.   Increase activity slowly   Complete by:  As directed       Signed: Jean Rosenthal, MD 06/18/2018, 3:06 PM   Pager: [phone] IMT[facility] PGY-1

## 2018-06-15 NOTE — ED Notes (Signed)
ED TO INPATIENT HANDOFF REPORT  ED Nurse Name and Phone #: Judson Roch [phone]  S Name/Age/Gender [name] 62 y.o. female Room/Bed: 022C/022C  Code Status   Code Status: Partial Code  Home/SNF/Other Home Patient oriented to: self, place, time and situation Is this baseline? Yes   Triage Complete: Triage complete  Chief Complaint WEAKNESS  Triage Note PT from home  With oral temp by EMS 100.9. Pt A/O on arrival to room . Pt has larngeale  Stoma because of throat Caner. Pt reports green sputum from stoma at home. Pt does not use O2 at home and O2 sats 94 on room air.   Allergies Allergies  Allergen Reactions  . Ace Inhibitors Anaphylaxis and Swelling    Angioedema  . Zestril [Lisinopril] Anaphylaxis and Swelling    Angioedema    Level of Care/Admitting Diagnosis ED Disposition    ED Disposition Condition [facility] Area: [place] [100100]  Level of Care: Telemetry Medical [104]  Covid Evaluation: N/A  Diagnosis: Sepsis [facility]) [[id number]]  Admitting Physician: Aline Brochure  Attending Physician: Bartholomew Crews [phone]  Estimated length of stay: past midnight tomorrow  Certification:: I certify this patient will need inpatient services for at least 2 midnights  PT Class (Do Not Modify): Inpatient [101]  PT Acc Code (Do Not Modify): Private [1]       B Medical/Surgery History Past Medical History:  Diagnosis Date  . Anxiety   . Arthritis   . COPD (chronic obstructive pulmonary disease) ([place])   . Depression   . 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 Diabetic neuropathy ([place])   . Difficult intubation    total laryngectomy  . Fibromyalgia   . Head and neck cancer ([place]) 04/19/2015  . Hypertension   . Kidney stone   . Morbid obesity with BMI of 40.0-44.9, adult ([place])   . Neuropathy   . Nocturia   . Pneumonia   . Rash, skin    face  . Renal insufficiency   . Shortness of  breath   . Sleep apnea    has not used cpap past 10 yrs  . Urge incontinence    Past Surgical History:  Procedure Laterality Date  . AMPUTATION Left 08/25/2015   Procedure: 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;  Surgeon: Newt Minion, MD;  Location: [facility];  Service: Orthopedics;  Laterality: Left;  . AMPUTATION Left 09/13/2015   Procedure: AMPUTATION BELOW KNEE;  Surgeon: Marybelle Killings, MD;  Location: [facility];  Service: Orthopedics;  Laterality: Left;  . BRAIN SURGERY     vertebral aneurysm  . BREAST LUMPECTOMY  1982   benign  . CARPEL TUNNEL    . CHOLECYSTECTOMY    . CYSTOSCOPY W/ URETERAL STENT PLACEMENT Left 03/22/2014   Procedure: CYSTOSCOPY WITH RETROGRADE PYELOGRAM/URETERAL STENT PLACEMENT;  Surgeon: Alexis Frock, MD;  Location: [facility];  Service: Urology;  Laterality: Left;  . CYSTOSCOPY WITH RETROGRADE PYELOGRAM, URETEROSCOPY AND STENT PLACEMENT Left 05/11/2014   Procedure: CYSTOSCOPY WITH RETROGRADE PYELOGRAM, URETEROSCOPY AND STENT PLACEMENT;  Surgeon: Alexis Frock, MD;  Location: [facility];  Service: Urology;  Laterality: Left;  . HOLMIUM LASER APPLICATION Left 08/16/2977   Procedure: HOLMIUM LASER APPLICATION;  Surgeon: Alexis Frock, MD;  Location[facility];  Service: Urology;  Laterality: Left;  . LARYNGECTOMY     total ( stoma)  . LEG AMPUTATION BELOW KNEE Left 09/13/2015  . SALIVARY STONE REMOVAL    .  TONSILLECTOMY    . TUBAL LIGATION       A IV Location/Drains/Wounds Patient Lines/Drains/Airways Status   Active Line/Drains/Airways    Name:   Placement date:   Placement time:   Site:   Days:   Peripheral IV 06/15/18 Left Forearm   06/15/18    0817    Forearm   less than 1   Peripheral IV 06/15/18 Left;Posterior Hand   06/15/18    1000    Hand   less than 1   Peripheral IV 06/15/18 Right Hand   06/15/18    1000    Hand   less than 1   Incision (Closed) 09/13/15 Leg Left   09/13/15    1724     1006   Tracheostomy   -    -    -      Wound / Incision (Open or Dehisced) 04/19/15  Diabetic ulcer Foot Left;Upper deep, unstagable ulcer to foot, purulent malordous drainage noted   04/19/15    1802    Foot   1153   Wound / Incision (Open or Dehisced) 04/19/15 Diabetic ulcer Foot Left left lateral foot has small open ulcer to foot, unstagable, black eschar present   04/19/15    1804    Foot   1153          Intake/Output Last 24 hours  Intake/Output Summary (Last 24 hours) at 06/15/2018 1338 Last data filed at 06/15/2018 1312 Gross per 24 hour  Intake 104.61 ml  Output -  Net 104.61 ml    Labs/Imaging Results for orders placed or performed during the hospital encounter of 06/15/18 (from the past 48 hour(s))  SARS Coronavirus 2 [facility] order, Performed in [place] hospital lab)     Status: None   Collection Time: 06/15/18  9:01 AM  Result Value Ref Range   SARS Coronavirus 2 NEGATIVE NEGATIVE    Comment: (NOTE) If result is NEGATIVE SARS-CoV-2 target nucleic acids are NOT DETECTED. The SARS-CoV-2 RNA is generally detectable in upper and lower  respiratory specimens during the acute phase of infection. The lowest  concentration of SARS-CoV-2 viral copies this assay can detect is 250  copies / mL. A negative result does not preclude SARS-CoV-2 infection  and should not be used as the sole basis for treatment or other  patient management decisions.  A negative result may occur with  improper specimen collection / handling, submission of specimen other  than nasopharyngeal swab, presence of viral mutation(s) within the  areas targeted by this assay, and inadequate number of viral copies  (<250 copies / mL). A negative result must be combined with clinical  observations, patient history, and epidemiological information. If result is POSITIVE SARS-CoV-2 target nucleic acids are DETECTED. The SARS-CoV-2 RNA is generally detectable in upper and lower  respiratory specimens dur ing the acute phase of infection.  Positive  results are indicative of active infection  with SARS-CoV-2.  Clinical  correlation with patient history and other diagnostic information is  necessary to determine patient infection status.  Positive results do  not rule out bacterial infection or co-infection with other viruses. If result is PRESUMPTIVE POSTIVE SARS-CoV-2 nucleic acids MAY BE PRESENT.   A presumptive positive result was obtained on the submitted specimen  and confirmed on repeat testing.  While 2019 novel coronavirus  (SARS-CoV-2) nucleic acids may be present in the submitted sample  additional confirmatory testing may be necessary for epidemiological  and / or clinical management purposes  to differentiate between  SARS-CoV-2 and other Sarbecovirus currently known to infect humans.  If clinically indicated additional testing with an alternate test  methodology [phone]) is advised. The SARS-CoV-2 RNA is generally  detectable in upper and lower respiratory sp ecimens during the acute  phase of infection. The expected result is Negative. Fact Sheet for Patients:  StrictlyIdeas.no Fact Sheet for Healthcare Providers: BankingDealers.co.za This test is not yet approved or cleared by the [place] FDA and has been authorized for detection and/or diagnosis of SARS-CoV-2 by FDA under an Emergency Use Authorization (EUA).  This EUA will remain in effect (meaning this test can be used) for the duration of the COVID-19 declaration under Section 564(b)(1) of the Act, 21 U.S.C. section 360bbb-3(b)(1), unless the authorization is terminated or revoked sooner. Performed at [facility], [place] [address], [place], [place] [address]   Lactic acid, plasma     Status: Abnormal   Collection Time: 06/15/18  9:13 AM  Result Value Ref Range   Lactic Acid, Venous 2.2 (HH) 0.5 - 1.9 mmol/L    Comment: CRITICAL RESULT CALLED TO, READ BACK BY AND VERIFIED WITH: AUDRY MCKEOWN,RN AT 1032 06/15/2018 BY ZBEECH. Performed at [facility], [place] [address], [place], [place] [address]   CBC WITH DIFFERENTIAL     Status: Abnormal   Collection Time: 06/15/18  9:13 AM  Result Value Ref Range   WBC 13.2 (H) 4.0 - 10.5 K/uL   RBC 5.45 (H) 3.87 - 5.11 MIL/uL   Hemoglobin 16.1 (H) 12.0 - 15.0 g/dL   HCT 48.4 (H) 36.0 - 46.0 %   MCV 88.8 80.0 - 100.0 fL   MCH 29.5 26.0 - 34.0 pg   MCHC 33.3 30.0 - 36.0 g/dL   RDW 12.9 11.5 - 15.5 %   Platelets 178 150 - 400 K/uL   nRBC 0.0 0.0 - 0.2 %   Neutrophils Relative % 90 %   Neutro Abs 12.1 (H) 1.7 - 7.7 K/uL   Lymphocytes Relative 5 %   Lymphs Abs 0.6 (L) 0.7 - 4.0 K/uL   Monocytes Relative 2 %   Monocytes Absolute 0.3 0.1 - 1.0 K/uL   Eosinophils Relative 1 %   Eosinophils Absolute 0.1 0.0 - 0.5 K/uL   Basophils Relative 1 %   Basophils Absolute 0.1 0.0 - 0.1 K/uL   Immature Granulocytes 1 %   Abs Immature Granulocytes 0.15 (H) 0.00 - 0.07 K/uL    Comment: Performed at [facility], [address], [place], [place] [address]  Comprehensive metabolic panel     Status: Abnormal   Collection Time: 06/15/18  9:13 AM  Result Value Ref Range   Sodium 137 135 - 145 mmol/L   Potassium 2.6 (LL) 3.5 - 5.1 mmol/L    Comment: CRITICAL RESULT CALLED TO, READ BACK BY AND VERIFIED WITH: AUDREY MCKEOWN,RN AT 7858 06/15/2018 BY ZBEECH.    Chloride 99 98 - 111 mmol/L   CO2 22 22 - 32 mmol/L   Glucose, Bld 186 (H) 70 - 99 mg/dL   BUN 21 (H) 6 - 20 mg/dL   Creatinine, Ser 1.50 (H) 0.44 - 1.00 mg/dL   Calcium 8.8 (L) 8.9 - 10.3 mg/dL   Total Protein 6.5 6.5 - 8.1 g/dL   Albumin 3.3 (L) 3.5 - 5.0 g/dL   AST 22 15 - 41 U/L   ALT 15 0 - 44 U/L   Alkaline Phosphatase 75 38 - 126 U/L   Total  Bilirubin 0.7 0.3 - 1.2 mg/dL   GFR calc non Af Amer 37 (L) >60 mL/min   GFR calc Af Amer 43 (L) >60 mL/min   Anion gap 16 (H) 5 - 15    Comment: Performed at [facility] [address], [place], [place] [address]  Procalcitonin     Status: None   Collection Time: 06/15/18   9:13 AM  Result Value Ref Range   Procalcitonin 7.47 ng/mL    Comment:        Interpretation: PCT > 2 ng/mL: Systemic infection (sepsis) is likely, unless other causes are known. (NOTE)       Sepsis PCT Algorithm           Lower Respiratory Tract                                      Infection PCT Algorithm    ----------------------------     ----------------------------         PCT < 0.25 ng/mL                PCT < 0.10 ng/mL         Strongly encourage             Strongly discourage   discontinuation of antibiotics    initiation of antibiotics    ----------------------------     -----------------------------       PCT 0.25 - 0.50 ng/mL            PCT 0.10 - 0.25 ng/mL               OR       >80% decrease in PCT            Discourage initiation of                                            antibiotics      Encourage discontinuation           of antibiotics    ----------------------------     -----------------------------         PCT >= 0.50 ng/mL              PCT 0.26 - 0.50 ng/mL               AND       <80% decrease in PCT              Encourage initiation of                                             antibiotics       Encourage continuation           of antibiotics    ----------------------------     -----------------------------        PCT >= 0.50 ng/mL                  PCT > 0.50 ng/mL               AND         increase in PCT  Strongly encourage                                      initiation of antibiotics    Strongly encourage escalation           of antibiotics                                     -----------------------------                                           PCT <= 0.25 ng/mL                                                 OR                                        > 80% decrease in PCT                                     Discontinue / Do not initiate                                             antibiotics Performed at [facility], [place] [address], [place], [place] [address]   Lactate dehydrogenase     Status: None   Collection Time: 06/15/18  9:13 AM  Result Value Ref Range   LDH 184 98 - 192 U/L    Comment: Performed at [facility], [place] [address], [place], [place] [address]  Ferritin     Status: None   Collection Time: 06/15/18  9:13 AM  Result Value Ref Range   Ferritin 170 11 - 307 ng/mL    Comment: Performed at [facility], [place] [address], [place], [place] [address]  Triglycerides     Status: None   Collection Time: 06/15/18  9:13 AM  Result Value Ref Range   Triglycerides 85 <150 mg/dL    Comment: Performed at [facility] [address], [place], [place] [address]  Fibrinogen     Status: Abnormal   Collection Time: 06/15/18  9:13 AM  Result Value Ref Range   Fibrinogen 705 (H) 210 - 475 mg/dL    Comment: Performed at [facility] [address] [place], [place] [address]  C-reactive protein     Status: Abnormal   Collection Time: 06/15/18  9:13 AM  Result Value Ref Range   CRP 19.7 (H) <1.0 mg/dL    Comment: Performed at [facility] [address] [place], [place] [address]  Brain natriuretic peptide     Status: Abnormal   Collection Time: 06/15/18  9:13 AM  Result Value Ref Range   B Natriuretic Peptide 300.0 (H) 0.0 - 100.0  pg/mL    Comment: Performed at [facility], [place] [address], [place], [place] [address]  Troponin I - Add-On to previous collection     Status: Abnormal   Collection Time: 06/15/18  9:13 AM  Result Value Ref Range   Troponin I 0.07 (HH) <0.03 ng/mL    Comment: CRITICAL RESULT CALLED TO, READ BACK BY AND VERIFIED WITH: SARA Brynda Heick,RN AT 1204 06/15/2018 BY ZBEECH. Performed at [facility], [place] [address], [place], [place] [address]   Urinalysis, Routine w reflex microscopic     Status: Abnormal   Collection Time: 06/15/18 11:55 AM  Result Value Ref Range   Color, Urine YELLOW YELLOW   APPearance HAZY (A) CLEAR    Specific Gravity, Urine 1.018 1.005 - 1.030   pH 5.0 5.0 - 8.0   Glucose, UA NEGATIVE NEGATIVE mg/dL   Hgb urine dipstick SMALL (A) NEGATIVE   Bilirubin Urine NEGATIVE NEGATIVE   Ketones, ur NEGATIVE NEGATIVE mg/dL   Protein, ur NEGATIVE NEGATIVE mg/dL   Nitrite POSITIVE (A) NEGATIVE   Leukocytes,Ua SMALL (A) NEGATIVE   RBC / HPF 0-5 0 - 5 RBC/hpf   WBC, UA 21-50 0 - 5 WBC/hpf   Bacteria, UA FEW (A) NONE SEEN   Squamous Epithelial / LPF 0-5 0 - 5   Hyaline Casts, UA PRESENT     Comment: Performed at [facility], [address] [place],  [address]   Dg Chest Port 1 View  Result Date: 06/15/2018 CLINICAL DATA:  Cough and fever with shortness of breath EXAM: PORTABLE CHEST 1 VIEW COMPARISON:  Chest radiograph September 09, 2015 and chest CT November 14, 2017 FINDINGS: There is atelectatic change in the left perihilar region. There is no edema or consolidation. Heart is slightly enlarged, stable, with pulmonary vascularity normal. No adenopathy. Tracheostomy present. No pneumothorax. No bone lesions. IMPRESSION: Slight left perihilar atelectasis. No edema or consolidation. Stable cardiac prominence. Apparent tracheostomy positioned at the C7-T1 level. No evident pneumothorax. Electronically Signed   By: Lowella Grip III M.D.   On: 06/15/2018 09:23    Pending Labs Unresulted Labs (From admission, onward)    Start     Ordered   06/16/18 0500  Comprehensive metabolic panel  Tomorrow morning,   R     06/15/18 1314   06/16/18 0500  CBC  Tomorrow morning,   R     06/15/18 1314   06/15/18 1333  Magnesium  Add-on,   R     06/15/18 1332   06/15/18 1313  Troponin I - Now Then Q6H  Now then every 6 hours,   R     06/15/18 1314   06/15/18 1308  HIV antibody (Routine Testing)  Once,   R     06/15/18 1314   06/15/18 7412  Basic metabolic panel  ONCE - STAT,   R     06/15/18 1222   06/15/18 0858  Lactic acid, plasma  Now then every 2 hours,   STAT     06/15/18 0901   06/15/18 0858   Blood Culture (routine x 2)  BLOOD CULTURE X 2,   STAT    Question:  Patient immune status  Answer:  Immunocompromised   06/15/18 0901          Vitals/Pain Today's Vitals   06/15/18 1215 06/15/18 1245 06/15/18 1300 06/15/18 1315  BP: (!) 112/59 101/74 119/67 134/69  Pulse: 93 97 91 93  Resp: 19 16 17  18  Temp:      TempSrc:      SpO2: 96% 99% 100% 96%  Weight:      Height:      PainSc:        Isolation Precautions No active isolations  Medications Medications  potassium chloride 10 mEq in 100 mL IVPB (10 mEq Intravenous Refused 06/15/18 1249)  metroNIDAZOLE (FLAGYL) IVPB 500 mg (500 mg Intravenous New Bag/Given 06/15/18 1244)  vancomycin (VANCOCIN) 1,500 mg in sodium chloride 0.9 % 500 mL IVPB (1,500 mg Intravenous New Bag/Given 06/15/18 1316)  ceFEPIme (MAXIPIME) 2 g in sodium chloride 0.9 % 100 mL IVPB (has no administration in time range)  vancomycin (VANCOCIN) 1,250 mg in sodium chloride 0.9 % 250 mL IVPB (has no administration in time range)  potassium chloride (KLOR-CON) packet 40 mEq (has no administration in time range)  Buprenorphine HCl-Naloxone HCl 8-2 MG FILM 1 Film (has no administration in time range)  pregabalin (LYRICA) capsule 50 mg (has no administration in time range)  albuterol (VENTOLIN HFA) 108 (90 Base) MCG/ACT inhaler 2 puff (has no administration in time range)  arformoterol (BROVANA) nebulizer solution 15 mcg (has no administration in time range)  umeclidinium bromide (INCRUSE ELLIPTA) 62.5 MCG/INH 1 puff (has no administration in time range)  insulin glargine (LANTUS) injection 10 Units (has no administration in time range)  insulin aspart (novoLOG) injection 0-20 Units (has no administration in time range)  heparin injection 5,000 Units (has no administration in time range)  acetaminophen (TYLENOL) tablet 650 mg (has no administration in time range)    Or  acetaminophen (TYLENOL) suppository 650 mg (has no administration in time range)   senna-docusate (Senokot-S) tablet 1 tablet (has no administration in time range)  sodium chloride 0.9 % bolus 500 mL (has no administration in time range)  fentaNYL (SUBLIMAZE) injection 50 mcg (50 mcg Intravenous Given 06/15/18 1023)  acetaminophen (TYLENOL) tablet 650 mg (650 mg Oral Given 06/15/18 1023)  sodium chloride 0.9 % bolus 500 mL (500 mLs Intravenous New Bag/Given 06/15/18 1133)  ceFEPIme (MAXIPIME) 2 g in sodium chloride 0.9 % 100 mL IVPB (0 g Intravenous Stopped 06/15/18 1312)    Mobility manual wheelchair Low fall risk   Focused Assessments Pulmonary Assessment Handoff:  Lung sounds:   O2 Device: Tracheostomy Collar O2 Flow Rate (L/min): 3 L/min      R Recommendations: See Admitting Provider Note  Report given to:   Additional Notes: L BKA, no O2 at home, 10L trach collar here.

## 2018-06-15 NOTE — Progress Notes (Signed)
RT NOTES: Received patient from ED on 40% venturi mask to trach. Placed patient on 40% aerosol trach collar for humidity. Will continue to monitor.

## 2018-06-15 NOTE — ED Notes (Signed)
Pt refusing IV potassium. Pt sts it still burns after multiple modifications by this RN to slow rate, dilute with fluid, reposition to another IV site. Cristie Hem, PA aware.

## 2018-06-15 NOTE — ED Triage Notes (Signed)
PT from home  With oral temp by EMS 100.9. Pt A/O on arrival to room . Pt has larngeale  Stoma because of throat [place]er. Pt reports green sputum from stoma at home. Pt does not use O2 at home and O2 sats 94 on room air.

## 2018-06-15 NOTE — H&P (Signed)
Date: 06/15/2018               Patient Name:  [name] MRN: [id number]  DOB: 10/18/1955 Age / Sex: 63 y.o., female   PCP: Kerin Perna, NP         Medical Service: Internal Medicine Teaching Service         Attending Physician: Dr. Lynnae January     First Contact: Dr. Eileen Stanford  Pager: [phone]  Second Contact: Dr. 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  She denied chest pain, palpitation, dysrhythmia, nausea, vomiting, diaphoresis, dizziness, lightheadedness, sick contacts, recent travel.   Couple days prior to presenting to the ED, she began experiencing diarrhea stating that she had an average of 3 loose stools per day and was present on route to the hospital.  She denies abdominal pain, recent antibiotic use, recent changes in diet, consumption of hamburger, lettuce or poultry.  She is had decrease oral intake as well as decreased urine output.  She denies dysuria, urinary frequency, urinary urgency.  Per chart review, She was recently evaluated by pulmonology in October 2019 and was treated for acute bronchitis following an upper respiratory infection and complaining of purulent secretion, bloodstained secretions.  This  was managed with a 7-day course of doxycycline  ED course: Febrile to 102F, Tachycardic 102, BP 17-22, 500cc NS bolus, SPO2 initially in the ED of 87% on room air however this improved to 100% [with and without trach collar], RR with range 14-22, CBC with leukocytosis of 13.2 with neutrophil predominance, procalcitonin 7.4, lactic acid 2.2, COVID 19 negative, urinalysis with positive nitrite, leukocyte and small hemoglobin, BMP with hypokalemia of 2.6, elevated fibrinogen and CRP, lactic acid of 2.2, BNP 300, troponin 0.07, checks x-ray with no evidence of focal infiltrate, EKG shows sinus tachycardia.  Received cefepime, Flagyl, vancomycin, normal saline bolus 1 L   Meds:  Current Meds  Medication Sig   albuterol (PROVENTIL HFA;VENTOLIN HFA) 108 (90 BASE) MCG/ACT inhaler Inhale 2 puffs into the lungs every 4 (four) hours as needed for wheezing or shortness of breath.   amLODipine (NORVASC) 10 MG tablet take 1 tablet (10 mg) by oral route once daily (Patient taking differently: Take 10 mg by mouth daily. )   Buprenorphine HCl-Naloxone HCl (SUBOXONE) 8-2 MG FILM Place 1 Film under the tongue 3 (three) times daily.    ibuprofen (ADVIL,MOTRIN) 200 MG tablet Take 400-600 mg by mouth every 6 (six) hours as needed for fever, headache or moderate pain.    insulin aspart (NOVOLOG FLEXPEN) 100 UNIT/ML FlexPen Inject 3 Units into the skin 3 (three) times daily with meals.   Insulin Glargine (LANTUS SOLOSTAR) 100 UNIT/ML Solostar Pen Inject 17 Units into the skin daily. At 10AM   Multiple Vitamin (  MULTIVITAMIN) tablet Take 1 tablet by mouth daily with supper.    pregabalin (LYRICA) 50 MG capsule Take 1 capsule (50 mg total) by mouth 3 (three) times daily. (Patient taking differently: Take 75 mg by mouth 2 (two) times daily. )     Allergies: Allergies as of 06/15/2018 - Review Complete 06/15/2018  Allergen Reaction Noted   Ace inhibitors Anaphylaxis and Swelling    Zestril [lisinopril]  Anaphylaxis and Swelling 03/02/2015   Past Medical History:  Diagnosis Date   Anxiety    Arthritis    COPD (chronic obstructive pulmonary disease) ([place])    Depression    Diabetes mellitus without complication ([place])    INSULIN DEPENDENT   Diabetic neuropathy ([place])    Difficult intubation    total laryngectomy   Fibromyalgia    Head and neck cancer ([place]) 04/19/2015   Hypertension    Kidney stone    Morbid obesity with BMI of 40.0-44.9, adult (HCC)    Neuropathy    Nocturia    Pneumonia    Rash, skin    face   Renal insufficiency    Shortness of breath    Sleep apnea    has not used cpap past 10 yrs   Urge incontinence     Social History: Former heavy smoker, quit smoking in 2017.  Denies EtOH or illicit drug use.  Was born in [place] and lives in [place] with her son and daughter.  She receives great support from family.  Review of Systems: A complete ROS was negative except as per HPI.   Review of Systems  Constitutional: Positive for chills and fever.  HENT: Negative for congestion and sore throat.   Eyes: Negative.   Respiratory: Positive for cough, sputum production, shortness of breath and wheezing.   Cardiovascular: Negative for chest pain, palpitations and leg swelling.  Gastrointestinal: Positive for diarrhea. Negative for abdominal pain, heartburn and nausea.  Genitourinary: Negative for dysuria, frequency and urgency.  Musculoskeletal: Negative for myalgias.  Neurological: Negative for tingling and headaches.    Physical Exam: Blood pressure 119/77, pulse 96, temperature 98.4 F (36.9 C), temperature source Oral, resp. rate 19, height 5\' 5"  (1.651 m), weight 99.8 kg, SpO2 100 %. Physical Exam Vitals signs and nursing note reviewed.  Constitutional:      General: She is not in acute distress.    Appearance: She is obese. She is not toxic-appearing.  HENT:     Head: Normocephalic and atraumatic.  Neck:     Musculoskeletal: Neck  supple. No neck rigidity.     Comments: Anterior stoma with clear margins, old scar and no signs of skin breakdown or infection. Cardiovascular:     Rate and Rhythm: Tachycardia present.     Heart sounds: Normal heart sounds. No murmur. No gallop.   Pulmonary:     Effort: Pulmonary effort is normal. No respiratory distress.     Breath sounds: No stridor. Wheezing present. No rhonchi or rales.  Chest:     Chest wall: No tenderness.  Abdominal:     General: Abdomen is flat. There is no distension.     Tenderness: There is no abdominal tenderness.  Musculoskeletal:        General: No tenderness, deformity or signs of injury.     Right lower leg: No edema.     Left lower leg: No edema.     Comments: Left lower extremity BKA  Skin:    General: Skin is warm.     Comments:  Left lower extremity BKA.  Skin is erythematous, nontender to touch, no signs of skin breakdown, mildly warm to touch  Neurological:     Mental Status: She is alert.       EKG: personally reviewed my interpretation is sinus tachycardia with incomplete RBBB which is unchanged from previous EKG in 2017.  CXR: personally reviewed my interpretation is no evidence of consolidation, edema or infiltration.  Assessment & Plan by Problem: Active Problems:   Sepsis ([place])  Ms. 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  #Sepsis of unclear etiology: Presented with a one-week history of intermittent fever, chills, dyspnea and cough productive of green sputum.  On arrival to the ED she was febrile, tachycardic, tachypneic and initially hypoxic to 87% which had recovered to 100%.  CBC  reviewed leukocytosis of 13.2 with neutrophil predominance, elevated procalcitonin of 7.4 and acidemia with lactic acid of 2.2.  She was noted to have pyuria as urinalysis showed positive nitrite, leukocyte and small hemoglobin although she denies urinary symptoms.  Possible etiologies include pulmonary (aspiration pneumonia versus atypical pneumonia versus community-acquired pneumonia versus viral pneumonia), urinary, cellulitis of left lower extremity.  Elevated fibrinogen and CRP could be secondary to acute phase reactant from ongoing systemic infection.  COVID-19 less likely and has been ruled out.  She received 1 L normal saline bolus, vancomycin, cefepime and Flagyl.  -Follow-up blood culture -Follow-up urine culture -Continue broad-spectrum antibiotics with vancomycin, cefepime and Flagyl -Follow-up CBC in the a.m.   #Dyspnea #Mild troponinemia Initially noted to be hypoxic to 87% on room air and was briefly placed on trach collar however oxygen saturation improved to 100% on room air.  Noted to have elevated BNP of 300 and troponin of 0.07 with repeat of 0.11.  She appears euvolemic and currently without any signs of acute coronary syndrome and chest x-ray without evidence of pulmonary vascular congestion this could be secondary to type II demand ischemia.  Given her longstanding smoking history, COPD is very possible though there is no formal definitive diagnosis. -Continue to trend troponin -Obtain EKG in the a.m.  #Left lower extremity cellulitis?:  She has a history of insulin-dependent mellitus, PAD status post left BKA.  On physical exams, left lower extremity appears erythematous, mildly warm to touch, nontender to palpation.  On careful inspection, there is no evidence of skin breakdown or infection of the stump. -Continue broad-spectrum antibiotics as stated above and monitor clinical progression.  #Acute kidney injury: Most likely prerenal as she reports decreased p.o. intake and  urine output in addition to recent onset diarrhea. sCr of 1.5 (Baseline ~1.1-1.5) -Status post normal saline bolus -Continue to monitor  #Hypokalemia: From GI loss as she reports of recent onset diarrhea - Continue repletion - Follow-up BMP  #Possible viral gastroenteritis: Reports of 2-day history of loose stools (3x/day).  Denies recent antibiotic use, hematochezia or GI bleed.  Last episode was this a.m. prior to arrival -Continue to monitor.  #Hypertension: BP stable -Hold amlodipine  #Insulin-dependent type 2 diabetes mellitus: -Continue Lantus 10 units qhs, sliding scale insulin  #COPD: No official PFTs on file -Continue Incruse Ellipta, Brovana, albuterol  #History of stage II squamous cell carcinoma of supraglottic larynx status post  total laryngectomy -Continue stoma care -Uses electro larynx for phonation  #History of stage IIa non-small cell lung cancer status post stereotactic radiation: -Last follow-up with radiation oncology was at [facility] in 2018.  #Chronic pain syndrome #Fibromyalgia -Continue Lyrica -Continue Suboxone  FEN: Replace electrolytes as needed, carb modified diet VTE ppx: Subcutaneous heparin CODE STATUS: Partial code  Dispo: Admit patient to Inpatient with expected length of stay greater than 2 midnights.  Signed: Jean Rosenthal, MD 06/15/2018, 2:20 PM  Pager: [phone] IMTS PGY-1

## 2018-06-15 NOTE — ED Notes (Signed)
Trialed pt off O2-pt dropped to 89% on room air. Resumed pt on 10L humidified O2 via trach collar.

## 2018-06-15 NOTE — Progress Notes (Signed)
Pharmacy Antibiotic Note  [name] is a 63 y.o. female admitted on 06/15/2018 with sepsis.  Pharmacy has been consulted for Vancomycin / Cefepime  dosing.  Plan: Vancomycin 1500 mg iv x 1 then 1250 mg iv Q 36 hours AUC of 496 Scr used 1.53  Cefepime 2 grams iv Q 12 hours Follow up Scr, cultures, progress  Height: 5\' 5"  (165.1 cm) Weight: 220 lb (99.8 kg) IBW/kg (Calculated) : 57  Temp (24hrs), Avg:100.2 F (37.9 C), Min:98.4 F (36.9 C), Max:102 F (38.9 C)  Recent Labs  Lab 06/15/18 0913  WBC 13.2*  CREATININE 1.50*  LATICACIDVEN 2.2*    Estimated Creatinine Clearance: 46.7 mL/min (A) (by C-G formula based on SCr of 1.5 mg/dL (H)).    Allergies  Allergen Reactions  . Ace Inhibitors Anaphylaxis and Swelling    Angioedema  . Zestril [Lisinopril] Anaphylaxis and Swelling    Angioedema    Thank you for allowing pharmacy to be a part of this patient's care. Anette Guarneri, PharmD [phone] 06/15/2018 12:44 PM

## 2018-06-15 NOTE — ED Provider Notes (Signed)
[facility] EMERGENCY DEPARTMENT Provider Note   CSN: [id number] Arrival date & time: 06/15/18  0815    History   Chief Complaint Chief Complaint  Patient presents with  . Fever    HPI [name] is a 63 y.o. female with history of lung cancer, laryngeal cancer with stoma, COPD, insulin-dependent diabetes, hypertension, obesity who presents with a 2-day history of fever, productive cough, shortness of breath, reported confusion.  Patient reports that her family has told her that she has been confused.  She reports she has had green sputum out of her stoma.  She denies any chest pain, abdominal pain, nausea, vomiting.  She did have some diarrhea in the past couple days.  She has chronic back pain and states she did not take her Suboxone this morning.  This is unchanged.  She is urinating normally.  She is no longer undergoing chemo or radiation.     HPI  Past Medical History:  Diagnosis Date  . Anxiety   . Arthritis   . COPD (chronic obstructive pulmonary disease) ([place])   . Depression   . Diabetes mellitus without complication ([place])    INSULIN DEPENDENT  . Diabetic neuropathy ([place])   . Difficult intubation    total laryngectomy  . Fibromyalgia   . Head and neck cancer ([place]) 04/19/2015  . Hypertension   . Kidney stone   . Morbid obesity with BMI of 40.0-44.9, adult ([place])   . Neuropathy   . Nocturia   . Pneumonia   . Rash, skin    face  . Renal insufficiency   . Shortness of breath   . Sleep apnea    has not used cpap past 10 yrs  . Urge incontinence     Patient Active Problem List   Diagnosis Date Noted  . Abnormal CT of the chest 11/28/2017  . Non-small cell cancer of left lung ([place]) 10/17/2017  . Laryngeal cancer ([place]) 10/17/2017  . Post-operative pain   . Constipation due to pain medication   . Neuropathic pain   . Muscle spasm   . SIRS (systemic inflammatory response syndrome) ([place]) 09/18/2015  . Labile blood pressure   . DM type 2  with diabetic peripheral neuropathy ([place])   . Abnormality of gait   . Unilateral complete BKA ([place]) 09/15/2015  . Status post below knee amputation of left lower extremity   . Acute bronchitis   . Primary osteoarthritis of right knee   . Infection   . Sepsis ([place]) 09/09/2015  . Wound infection 09/09/2015  . Diabetic ulcer of foot with necrosis of bone ([place]) 09/09/2015  . Type 2 diabetes mellitus with circulatory disorder ([place])   . Osteomyelitis of foot, acute, left 08/25/2015  . Diabetic foot infection ([place]) 04/19/2015  . Diabetic foot ulcer ([place]) 04/19/2015  . CKD (chronic kidney disease) 04/19/2015  . Head and neck cancer ([place]) 04/19/2015  . Right hip pain 04/19/2015  . Infection of supraglottis (throat) 09/16/2014  . Incidental lung nodule, less than or equal to 53m 09/16/2014  . Obstructive uropathy 03/22/2014  . COPD (chronic obstructive pulmonary disease) ([place] 03/20/2014  . Controlled type 2 diabetes mellitus with diabetic nephropathy ([place] 03/20/2014  . Lice infested hair 062/13/0865 . Tobacco abuse 08/05/2012  . TINEA CORPORIS 09/01/2009  . DEPRESSION 08/11/2009  . Chronic pain syndrome 04/15/2007  . HYPERLIPIDEMIA NEC/NOS 11/02/2006  . Peripheral vascular disease ([place] 11/02/2006  . MORBID OBESITY 10/28/2006  . Obstructive sleep apnea 10/28/2006  .  POLYNEUROPATHY 10/28/2006  . Benign essential HTN 10/28/2006  . OSTEOARTHRITIS, KNEES, BILATERAL 10/28/2006  . PROTEINURIA 11/25/2005    Past Surgical History:  Procedure Laterality Date  . AMPUTATION Left 08/25/2015   Procedure: 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;  Surgeon: Newt Minion, MD;  Location: [facility];  Service: Orthopedics;  Laterality: Left;  . AMPUTATION Left 09/13/2015   Procedure: AMPUTATION BELOW KNEE;  Surgeon: Marybelle Killings, MD;  Location: [facility];  Service: Orthopedics;  Laterality: Left;  . BRAIN SURGERY     vertebral aneurysm  . BREAST LUMPECTOMY  1982   benign  . CARPEL TUNNEL    . CHOLECYSTECTOMY    .  CYSTOSCOPY W/ URETERAL STENT PLACEMENT Left 03/22/2014   Procedure: CYSTOSCOPY WITH RETROGRADE PYELOGRAM/URETERAL STENT PLACEMENT;  Surgeon: Alexis Frock, MD;  Location: [facility];  Service: Urology;  Laterality: Left;  . CYSTOSCOPY WITH RETROGRADE PYELOGRAM, URETEROSCOPY AND STENT PLACEMENT Left 05/11/2014   Procedure: CYSTOSCOPY WITH RETROGRADE PYELOGRAM, URETEROSCOPY AND STENT PLACEMENT;  Surgeon: Alexis Frock, MD;  Location: [facility];  Service: Urology;  Laterality: Left;  . HOLMIUM LASER APPLICATION Left 6/78/9381   Procedure: HOLMIUM LASER APPLICATION;  Surgeon: Alexis Frock, MD;  Location: [facility];  Service: Urology;  Laterality: Left;  . LARYNGECTOMY     total ( stoma)  . LEG AMPUTATION BELOW KNEE Left 09/13/2015  . SALIVARY STONE REMOVAL    . TONSILLECTOMY    . TUBAL LIGATION       OB History   No obstetric history on file.      Home Medications    Prior to Admission medications   Medication Sig Start Date End Date Taking? Authorizing Provider  albuterol (PROVENTIL HFA;VENTOLIN HFA) 108 (90 BASE) MCG/ACT inhaler Inhale 2 puffs into the lungs every 4 (four) hours as needed for wheezing or shortness of breath. 08/10/12   Hosie Poisson, MD  amLODipine (NORVASC) 10 MG tablet take 1 tablet (10 mg) by oral route once daily 04/30/16   Newt Minion, MD  blood glucose meter kit and supplies KIT Dispense based on patient and insurance preference. Use up to four times daily as directed. (FOR ICD-9 250.00, 250.01). 03/24/14   Bonnielee Haff, MD  Buprenorphine HCl-Naloxone HCl (SUBOXONE) 8-2 MG FILM Place 1 Film under the tongue 3 (three) times daily.     [provider]  doxycycline (VIBRA-TABS) 100 MG tablet Take 1 tablet (100 mg total) by mouth 2 (two) times daily. 11/28/17   Collene Gobble, MD  hydrochlorothiazide (HYDRODIURIL) 25 MG tablet Take 25 mg by mouth daily.    [provider]  ibuprofen (ADVIL,MOTRIN) 200 MG tablet Take 400-600 mg by mouth every 6 (six) hours as  needed for fever, headache or moderate pain.     [provider]  insulin aspart (NOVOLOG FLEXPEN) 100 UNIT/ML FlexPen Inject 3 Units into the skin 3 (three) times daily with meals.    [provider]  Insulin Glargine (LANTUS SOLOSTAR) 100 UNIT/ML Solostar Pen Inject 17 Units into the skin daily. At 10AM 09/26/15   LoveIvan Anchors, PA-C  Insulin Pen Needle (PEN NEEDLES 3/16") 31G X 5 MM MISC Use as directed. 03/24/14   Bonnielee Haff, MD  Multiple Vitamin (MULTIVITAMIN) tablet Take 1 tablet by mouth daily with supper.     [provider]  polyethylene glycol (MIRALAX) packet Take 17 g by mouth daily. 10/04/15   Jamse Arn, MD  pregabalin (LYRICA) 50 MG capsule Take 1 capsule (50 mg total) by  mouth 3 (three) times daily. 09/26/15   Love, Ivan Anchors, PA-C  Tiotropium Bromide-Olodaterol (STIOLTO RESPIMAT) 2.5-2.5 MCG/ACT AERS Inhale 2 puffs into the lungs daily. 11/28/17   Collene Gobble, MD    Family History Family History  Adopted: Yes    Social History Social History   Tobacco Use  . Smoking status: Former Smoker    Packs/day: 1.00    Years: 40.00    Pack years: 40.00    Types: Cigarettes    Last attempt to quit: 05/22/2015    Years since quitting: 3.0  . Smokeless tobacco: Never Used  Substance Use Topics  . Alcohol use: No  . Drug use: No     Allergies   Ace inhibitors and Zestril [lisinopril]   Review of Systems Review of Systems  Constitutional: Positive for fever. Negative for chills.  HENT: Negative for facial swelling and sore throat.   Respiratory: Positive for cough, shortness of breath and wheezing.   Cardiovascular: Negative for chest pain.  Gastrointestinal: Positive for diarrhea. Negative for abdominal pain, blood in stool, nausea and vomiting.  Genitourinary: Negative for dysuria.  Musculoskeletal: Positive for back pain (chronic ).  Skin: Negative for rash and wound.  Neurological: Negative for headaches.  Psychiatric/Behavioral:  Positive for confusion. The patient is not nervous/anxious.      Physical Exam Updated Vital Signs BP 101/74   Pulse 97   Temp 98.4 F (36.9 C) (Oral)   Resp 16   Ht _0  (1.651 m)   Wt 99.8 kg   SpO2 99%   BMI 36.61 kg/m   Physical Exam Vitals signs and nursing note reviewed.  Constitutional:      General: She is not in acute distress.    Appearance: She is well-developed. She is not diaphoretic.     Comments: Patient speaking with device for stoma  HENT:     Head: Normocephalic and atraumatic.     Mouth/Throat:     Pharynx: No oropharyngeal exudate.  Eyes:     General: No scleral icterus.       Right eye: No discharge.        Left eye: No discharge.     Conjunctiva/sclera: Conjunctivae normal.     Pupils: Pupils are equal, round, and reactive to light.  Neck:     Musculoskeletal: Normal range of motion and neck supple.     Thyroid: No thyromegaly.     Comments: Stoma Cardiovascular:     Rate and Rhythm: Normal rate and regular rhythm.     Heart sounds: Normal heart sounds. No murmur. No friction rub. No gallop.   Pulmonary:     Effort: Pulmonary effort is normal. No respiratory distress.     Breath sounds: No stridor. Wheezing (few expiratory mid lung fields bilaterally) present. No rales.  Abdominal:     General: Bowel sounds are normal. There is no distension.     Palpations: Abdomen is soft.     Tenderness: There is no abdominal tenderness. There is no guarding or rebound.  Musculoskeletal:     Comments: S/p left BKA  Lymphadenopathy:     Cervical: No cervical adenopathy.  Skin:    General: Skin is warm and dry.     Coloration: Skin is not pale.     Findings: No rash.  Neurological:     Mental Status: She is alert.     Coordination: Coordination normal.      ED Treatments / Results  Labs (all labs ordered are  listed, but only abnormal results are displayed) Labs Reviewed  LACTIC ACID, PLASMA - Abnormal; Notable for the following components:       Result Value   Lactic Acid, Venous 2.2 (*)    All other components within normal limits  CBC WITH DIFFERENTIAL/PLATELET - Abnormal; Notable for the following components:   WBC 13.2 (*)    RBC 5.45 (*)    Hemoglobin 16.1 (*)    HCT 48.4 (*)    Neutro Abs 12.1 (*)    Lymphs Abs 0.6 (*)    Abs Immature Granulocytes 0.15 (*)    All other components within normal limits  COMPREHENSIVE METABOLIC PANEL - Abnormal; Notable for the following components:   Potassium 2.6 (*)    Glucose, Bld 186 (*)    BUN 21 (*)    Creatinine, Ser 1.50 (*)    Calcium 8.8 (*)    Albumin 3.3 (*)    GFR calc non Af Amer 37 (*)    GFR calc Af Amer 43 (*)    Anion gap 16 (*)    All other components within normal limits  FIBRINOGEN - Abnormal; Notable for the following components:   Fibrinogen 705 (*)    All other components within normal limits  C-REACTIVE PROTEIN - Abnormal; Notable for the following components:   CRP 19.7 (*)    All other components within normal limits  BRAIN NATRIURETIC PEPTIDE - Abnormal; Notable for the following components:   B Natriuretic Peptide 300.0 (*)    All other components within normal limits  URINALYSIS, ROUTINE W REFLEX MICROSCOPIC - Abnormal; Notable for the following components:   APPearance HAZY (*)    Hgb urine dipstick SMALL (*)    Nitrite POSITIVE (*)    Leukocytes,Ua SMALL (*)    Bacteria, UA FEW (*)    All other components within normal limits  TROPONIN I - Abnormal; Notable for the following components:   Troponin I 0.07 (*)    All other components within normal limits  SARS CORONAVIRUS 2 (HOSPITAL ORDER, [facility])  CULTURE, BLOOD (ROUTINE X 2)  CULTURE, BLOOD (ROUTINE X 2)  PROCALCITONIN  LACTATE DEHYDROGENASE  FERRITIN  TRIGLYCERIDES  LACTIC ACID, PLASMA  BASIC METABOLIC PANEL    EKG None  Radiology Dg Chest Port 1 View  Result Date: 06/15/2018 CLINICAL DATA:  Cough and fever with shortness of breath EXAM: PORTABLE  CHEST 1 VIEW COMPARISON:  Chest radiograph September 09, 2015 and chest CT November 14, 2017 FINDINGS: There is atelectatic change in the left perihilar region. There is no edema or consolidation. Heart is slightly enlarged, stable, with pulmonary vascularity normal. No adenopathy. Tracheostomy present. No pneumothorax. No bone lesions. IMPRESSION: Slight left perihilar atelectasis. No edema or consolidation. Stable cardiac prominence. Apparent tracheostomy positioned at the C7-T1 level. No evident pneumothorax. Electronically Signed   By: Lowella Grip III M.D.   On: 06/15/2018 09:23    Procedures .Critical Care Performed by: Frederica Kuster, PA-C Authorized by: Frederica Kuster, PA-C   Critical care provider statement:    Critical care time (minutes):  45   Critical care was necessary to treat or prevent imminent or life-threatening deterioration of the following conditions:  Respiratory failure, sepsis, metabolic crisis, dehydration and renal failure   Critical care was time spent personally by me on the following activities:  Discussions with consultants, evaluation of patient's response to treatment, examination of patient, ordering and performing treatments and interventions, ordering and review of  laboratory studies, ordering and review of radiographic studies, pulse oximetry, re-evaluation of patient's condition, obtaining history from patient or surrogate and review of old charts   I assumed direction of critical care for this patient from another provider in my specialty: no     (including critical care time)  Medications Ordered in ED Medications  potassium chloride 10 mEq in 100 mL IVPB (10 mEq Intravenous Refused 06/15/18 1249)  ceFEPIme (MAXIPIME) 2 g in sodium chloride 0.9 % 100 mL IVPB (2 g Intravenous New Bag/Given 06/15/18 1242)  metroNIDAZOLE (FLAGYL) IVPB 500 mg (500 mg Intravenous New Bag/Given 06/15/18 1244)  vancomycin (VANCOCIN) 1,500 mg in sodium chloride 0.9 % 500 mL IVPB  (has no administration in time range)  ceFEPIme (MAXIPIME) 2 g in sodium chloride 0.9 % 100 mL IVPB (has no administration in time range)  vancomycin (VANCOCIN) 1,250 mg in sodium chloride 0.9 % 250 mL IVPB (has no administration in time range)  potassium chloride (KLOR-CON) packet 40 mEq (has no administration in time range)  fentaNYL (SUBLIMAZE) injection 50 mcg (50 mcg Intravenous Given 06/15/18 1023)  acetaminophen (TYLENOL) tablet 650 mg (650 mg Oral Given 06/15/18 1023)  sodium chloride 0.9 % bolus 500 mL (500 mLs Intravenous New Bag/Given 06/15/18 1133)     Initial Impression / Assessment and Plan / ED Course  I have reviewed the triage vital signs and the nursing notes.  Pertinent labs & imaging results that were available during my care of the patient were reviewed by me and considered in my medical decision making (see chart for details).        Patient presenting with fever, cough, shortness of breath with hypoxia to 87% on RA.  Initial concern for COVID-19, however this returned negative.  Although I would still consider this considering rates of false negatives.  Chest x-ray shows a slight left perihilar atelectasis without edema or consolidation.  Patient does have a urinary tract infection.  Blood cultures sent and pending.  Gentle fluids initiated.  Patient found to be hypokalemic and with mild AKI.  Leukocytosis at 13.2.  Potassium IV initiated, however patient could not tolerate the burning of the IV.  Stopped for now.  Broad-spectrum antibiotics initiated.  I discussed patient case with the internal medicine teaching service who accepts patient for admission. I discussed patient case with Dr. Venora Maples, my attending, who guided the patient's management and agrees with plan.   Final Clinical Impressions(s) / ED Diagnoses   Final diagnoses:  Hypoxia  Fever and chills  Shortness of breath    ED Discharge Orders    None       Frederica Kuster, Hershal Coria 06/15/18 [place], Kevin, MD 06/15/18 1321

## 2018-06-16 ENCOUNTER — Inpatient Hospital Stay (HOSPITAL_COMMUNITY): Payer: Medicaid Other

## 2018-06-16 DIAGNOSIS — Z930 Tracheostomy status: Secondary | ICD-10-CM

## 2018-06-16 DIAGNOSIS — Z87891 Personal history of nicotine dependence: Secondary | ICD-10-CM

## 2018-06-16 DIAGNOSIS — M797 Fibromyalgia: Secondary | ICD-10-CM

## 2018-06-16 DIAGNOSIS — Z89512 Acquired absence of left leg below knee: Secondary | ICD-10-CM

## 2018-06-16 DIAGNOSIS — R197 Diarrhea, unspecified: Secondary | ICD-10-CM

## 2018-06-16 DIAGNOSIS — I4510 Unspecified right bundle-branch block: Secondary | ICD-10-CM

## 2018-06-16 DIAGNOSIS — R0902 Hypoxemia: Secondary | ICD-10-CM

## 2018-06-16 DIAGNOSIS — Z85118 Personal history of other malignant neoplasm of bronchus and lung: Secondary | ICD-10-CM

## 2018-06-16 DIAGNOSIS — A419 Sepsis, unspecified organism: Principal | ICD-10-CM

## 2018-06-16 DIAGNOSIS — E1151 Type 2 diabetes mellitus with diabetic peripheral angiopathy without gangrene: Secondary | ICD-10-CM

## 2018-06-16 DIAGNOSIS — Z923 Personal history of irradiation: Secondary | ICD-10-CM

## 2018-06-16 DIAGNOSIS — Z7951 Long term (current) use of inhaled steroids: Secondary | ICD-10-CM

## 2018-06-16 DIAGNOSIS — I440 Atrioventricular block, first degree: Secondary | ICD-10-CM

## 2018-06-16 DIAGNOSIS — T8789 Other complications of amputation stump: Secondary | ICD-10-CM

## 2018-06-16 DIAGNOSIS — R509 Fever, unspecified: Secondary | ICD-10-CM

## 2018-06-16 DIAGNOSIS — G894 Chronic pain syndrome: Secondary | ICD-10-CM

## 2018-06-16 DIAGNOSIS — Z9002 Acquired absence of larynx: Secondary | ICD-10-CM

## 2018-06-16 DIAGNOSIS — Z79899 Other long term (current) drug therapy: Secondary | ICD-10-CM

## 2018-06-16 DIAGNOSIS — J449 Chronic obstructive pulmonary disease, unspecified: Secondary | ICD-10-CM

## 2018-06-16 DIAGNOSIS — Z8521 Personal history of malignant neoplasm of larynx: Secondary | ICD-10-CM

## 2018-06-16 DIAGNOSIS — Z794 Long term (current) use of insulin: Secondary | ICD-10-CM

## 2018-06-16 DIAGNOSIS — I10 Essential (primary) hypertension: Secondary | ICD-10-CM

## 2018-06-16 LAB — CBC
HCT: 41.5 % (ref 36.0–46.0)
Hemoglobin: 13.4 g/dL (ref 12.0–15.0)
MCH: 28.1 pg (ref 26.0–34.0)
MCHC: 32.3 g/dL (ref 30.0–36.0)
MCV: 87 fL (ref 80.0–100.0)
Platelets: 156 10*3/uL (ref 150–400)
RBC: 4.77 MIL/uL (ref 3.87–5.11)
RDW: 12.9 % (ref 11.5–15.5)
WBC: 11 10*3/uL — ABNORMAL HIGH (ref 4.0–10.5)
nRBC: 0 % (ref 0.0–0.2)

## 2018-06-16 LAB — GLUCOSE, CAPILLARY
Glucose-Capillary: 132 mg/dL — ABNORMAL HIGH (ref 70–99)
Glucose-Capillary: 121 mg/dL — ABNORMAL HIGH (ref 70–99)
Glucose-Capillary: 134 mg/dL — ABNORMAL HIGH (ref 70–99)
Glucose-Capillary: 103 mg/dL — ABNORMAL HIGH (ref 70–99)

## 2018-06-16 LAB — COMPREHENSIVE METABOLIC PANEL
ALT: 19 U/L (ref 0–44)
AST: 26 U/L (ref 15–41)
Albumin: 2.7 g/dL — ABNORMAL LOW (ref 3.5–5.0)
Alkaline Phosphatase: 64 U/L (ref 38–126)
Anion gap: 11 (ref 5–15)
BUN: 20 mg/dL (ref 6–20)
CO2: 26 mmol/L (ref 22–32)
Calcium: 8.3 mg/dL — ABNORMAL LOW (ref 8.9–10.3)
Chloride: 102 mmol/L (ref 98–111)
Creatinine, Ser: 1.18 mg/dL — ABNORMAL HIGH (ref 0.44–1.00)
GFR calc Af Amer: 58 mL/min — ABNORMAL LOW (ref >60)
GFR calc non Af Amer: 50 mL/min — ABNORMAL LOW (ref >60)
Glucose, Bld: 138 mg/dL — ABNORMAL HIGH (ref 70–99)
Potassium: 3.5 mmol/L (ref 3.5–5.1)
Sodium: 139 mmol/L (ref 135–145)
Total Bilirubin: 0.4 mg/dL (ref 0.3–1.2)
Total Protein: 5.6 g/dL — ABNORMAL LOW (ref 6.5–8.1)

## 2018-06-16 LAB — TROPONIN I: Troponin I: 0.03 ng/mL (ref <0.03)

## 2018-06-16 LAB — HIV ANTIBODY (ROUTINE TESTING W REFLEX): HIV Screen 4th Generation wRfx: NONREACTIVE

## 2018-06-16 MED ORDER — ALBUTEROL SULFATE (2.5 MG/3ML) 0.083% IN NEBU: 2.5000 mg | INHALATION_SOLUTION | RESPIRATORY_TRACT | Status: DC | PRN

## 2018-06-16 MED ORDER — ONDANSETRON HCL 4 MG/2ML IJ SOLN
4.0000 mg | Freq: Once | INTRAMUSCULAR | Status: AC
  Administered 2018-06-16: 4 mg via INTRAVENOUS
  Filled 2018-06-16: qty 2

## 2018-06-16 MED ORDER — ORAL CARE MOUTH RINSE
15.0000 mL | Freq: Two times a day (BID) | OROMUCOSAL | Status: DC
  Administered 2018-06-16 – 2018-06-18 (×5): 15 mL via OROMUCOSAL

## 2018-06-16 MED ORDER — DM-GUAIFENESIN ER 30-600 MG PO TB12
1.0000 | ORAL_TABLET | Freq: Two times a day (BID) | ORAL | Status: AC
  Administered 2018-06-16 – 2018-06-17 (×4): 1 via ORAL
  Filled 2018-06-16 (×4): qty 1

## 2018-06-16 MED ORDER — CHLORHEXIDINE GLUCONATE 0.12 % MT SOLN
15.0000 mL | Freq: Two times a day (BID) | OROMUCOSAL | Status: DC
  Administered 2018-06-16 – 2018-06-17 (×3): 15 mL via OROMUCOSAL
  Filled 2018-06-16 (×4): qty 15

## 2018-06-16 NOTE — Progress Notes (Signed)
Subjective: HD#1   Overnight: Temp 100.2  Today she is sitting up in bed and appears well. The battery for her electrolarynx is dead so we have to read her lips to communicate. She states that her trach feels "stopped up" and she usually suctions it at home. She doesn't like the hospital food and her appetite feels poor because food makes her stomach upset. She is able to eat chicken, cereal, sandwiches at home but has to drink a lot. She has had 3 additional BMs since admission. They are watery and small amount that comes out when she coughs. Denies other family members or contacts with diarrhea. Her left stump is still red and "hurts a little bit." She feels that it is worse compared to yesterday.  Discussed plan for continued antibiotics while we await urine and blood culture results  Objective:  Vital signs in last 24 hours: Vitals:   06/15/18 2135 06/16/18 0300 06/16/18 0504 06/16/18 0527  BP: 140/71   133/87  Pulse: 86   (!) 110  Resp:      Temp: 99.4 F (37.4 C)   100.2 F (37.9 C)  TempSrc: Oral   Oral  SpO2: 94% 95%  96%  Weight:   102.6 kg   Height:       Const: Sitting on bed comfortably, In NAD HEENT: Stoma site looks clean Resp: Very minimal expiratory wheezes R>L, no crackles or rhonchi  CV: RRR, no MGR Ext: LLE is more erythematous, slightly warm to touch   Assessment/Plan:  Active Problems:   Sepsis ([place])   [name] is a 63 year old woman with insulin-dependent diabetes mellitus, peripheral arterial disease status post left BKA, hypertension, 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  #Sepsis of unknown etiology: She was noted to have mild febrile episode with temperature of 100.2 at around 5 AM this morning.  Also continues to endorse chills  and mucus congestion in the chest but is otherwise doing well and denies shortness of breath.  Objectively, leukocytosis has trended down.  Lung auscultation does not reveal any rales, rhonchi but there is minimal expiratory wheezes worse on the right.  Repeat chest x-ray continues to remain unremarkable without focal infiltrate, consolidation or vascular congestion. -Follow-up blood culture -Follow-up urine culture -Continue broad-spectrum antibiotics with vancomycin, cefepime and Flagyl -Follow-up CBC in the a.m.  -Mucinex  #Nonpurulent left lower extremity cellulitis:   Left lower extremity stump still erythematous, mildly warm to touch and nontender to palpation.  Still there is no visible skin breakdown or drainage. -Continue vancomycin, cefepime and Flagyl  #Diarrhea: Since admission, she has had ~3 bowel movements though they are not loose.  She denies any other family members who has experienced diarrhea or sick contacts. -Continue to monitor.  #Hypertension: BP stable -Hold amlodipine  #Insulin-dependent type 2 diabetes mellitus: -Continue Lantus 10 units qhs, sliding scale insulin  #COPD:  -Continue Incruse Ellipta, Brovana, albuterol  #Dyspnea - resolved #Mild troponinemia - resolved   #Acute kidney injury: Resolved. sCr this am 1.1 -Continue to monitor  #Hypokalemia: Resolved - Follow-up BMP  #History of stage II squamous cell carcinoma of supraglottic larynx status post total laryngectomy -Continue stoma care -Uses electro larynx for phonation  #History of stage IIa non-small cell lung cancer status post stereotactic radiation: -Last follow-up with radiation  oncology was at [facility] in 2018.  #Chronic pain syndrome #Fibromyalgia -Continue Lyrica -Continue Suboxone  FEN: Replace electrolytes as needed, carb modified diet VTE ppx: Subcutaneous heparin CODE STATUS: Partial code  Dispo: Anticipated discharge in approximately 1-2 day(s).   Chelsea Rosenthal, MD 06/16/2018, 6:15 AM Pager: [phone] IMT[facility] PGY-1

## 2018-06-16 NOTE — Progress Notes (Signed)
Date: 06/16/2018  Patient name: [name]  Medical record number: [id number]  Date of birth: 1955-03-29   I have seen and evaluated [name] and discussed their care with the Residency Team.  [name] is a 63 year old woman status post left BKA for a nonhealing fifth ray amputation with concern for osteomyelitis in 2017.  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 She also was diagnosed with stage IIa non-small cell lung cancer in 2018 and had stereotactic radiation.  She presented to the ED with 1 week of fever to 102, chills, dyspnea, purulent sputum, and transient confusion.  She was diagnosed with sepsis with possible sources including left stump cellulitis, a pulmonary source, a UTI, all with or without bacteremia.  This morning, she continues to have fever and chills, loose stools (3x overnight), mucus in her chest without ability to clear her coughing, anorexia, and redness of her stump.  PMHx, Fam Hx, and/or Soc Hx : No other family members or contacts has had GI symptoms.  She quit tobacco in 2017.  Vitals:   06/16/18 0527 06/16/18 0709  BP: 133/87   Pulse: (!) 110 93  Resp:  18  Temp: 100.2 F (37.9 C)   SpO2: 96% 97%  General no acute distress sitting up in bed. Heart sinus tach, no murmurs Lungs good airflow throughout with some end expiratory wheezing Right lower extremity with increased pigmentation indicative of longstanding edema Left stump with erythema proximal to knee but no drainage or open wounds Stoma site looks clean without any drainage The skin warm but clammy  Creatinine 1.5 - 1.3 - 1.2 Pro calcitonin 7.5 WBC 13.2 - 11.0 COVID negative HIV negative UA 0-5 squamous epithelial cells, small hemoglobin, 0-5 RBCs, positive nitrite, 21-50 white blood cells  I personally viewed the CXR images and confirmed my reading with the official read.  One-view  portable on admission, rotated, poor inspiratory effort, no overt infiltrate, tracheostomy present.  Repeat chest x-ray today no change.  I personally viewed the EKG and confirmed my reading with the official read.  Sinus tach, incomplete RBBB, first-degree AV block  Assessment and Plan: I have seen and evaluated the patient as outlined above. I agree with the formulated Assessment and Plan as detailed in the residents' note, with the following changes:  [name] is a 63 year old woman status post left BKA, status post total laryngectomy due to stage II SCC, s/p XRT chest for stage IIa non-small cell lung cancer.  She was admitted with sepsis with several etiologies including urine, pulmonary, and cellulitis of left stump.  She was started on broad-spectrum antibiotics including vancomycin, cefepime, and metronidazole.  Culture results are still pending.  Hemodynamically she is stable.  Labs are stable or improving, the erythema of her left stump has expanded since yesterday despite antibiotics.  We will continue broad-spectrum antibiotics until we can localize the source of her sepsis, she is afebrile, and overall clinically improved.  1.  Sepsis -clinically she is stable although she remains febrile.  We are continuing broad-spectrum antibiotics as a try to localize the source.  Possible sources include urine, left stump cellulitis, or pulmonary infection.  2.  Diarrhea -no other family members or contacts are ill.  C. difficile is unlikely as she has had no recent antibiotic exposure.  This could be a response to her sepsis or a viral gastroenteritis.  We will continue  to follow clinically.  Other medical issues per Dr. Nelia Shi H&P and daily progress note.  We informed her she would likely be in the hospital a few more days for stabilization.  Bartholomew Crews, MD 4/21/20209:57 AM

## 2018-06-17 DIAGNOSIS — B9620 Unspecified Escherichia coli [E. coli] as the cause of diseases classified elsewhere: Secondary | ICD-10-CM

## 2018-06-17 DIAGNOSIS — R000 Tachycardia, unspecified: Secondary | ICD-10-CM

## 2018-06-17 DIAGNOSIS — N390 Urinary tract infection, site not specified: Secondary | ICD-10-CM

## 2018-06-17 LAB — CBC
HCT: 40.1 % (ref 36.0–46.0)
Hemoglobin: 13.1 g/dL (ref 12.0–15.0)
MCH: 28.7 pg (ref 26.0–34.0)
MCHC: 32.7 g/dL (ref 30.0–36.0)
MCV: 87.9 fL (ref 80.0–100.0)
Platelets: 145 10*3/uL — ABNORMAL LOW (ref 150–400)
RBC: 4.56 MIL/uL (ref 3.87–5.11)
RDW: 12.9 % (ref 11.5–15.5)
WBC: 7.9 10*3/uL (ref 4.0–10.5)
nRBC: 0 % (ref 0.0–0.2)

## 2018-06-17 LAB — BASIC METABOLIC PANEL
Anion gap: 10 (ref 5–15)
BUN: 15 mg/dL (ref 6–20)
CO2: 26 mmol/L (ref 22–32)
Calcium: 8.6 mg/dL — ABNORMAL LOW (ref 8.9–10.3)
Chloride: 103 mmol/L (ref 98–111)
Creatinine, Ser: 0.91 mg/dL (ref 0.44–1.00)
GFR calc Af Amer: >60 mL/min (ref >60)
GFR calc non Af Amer: >60 mL/min (ref >60)
Glucose, Bld: 116 mg/dL — ABNORMAL HIGH (ref 70–99)
Potassium: 3.4 mmol/L — ABNORMAL LOW (ref 3.5–5.1)
Sodium: 139 mmol/L (ref 135–145)

## 2018-06-17 LAB — GLUCOSE, CAPILLARY
Glucose-Capillary: 95 mg/dL (ref 70–99)
Glucose-Capillary: 143 mg/dL — ABNORMAL HIGH (ref 70–99)
Glucose-Capillary: 109 mg/dL — ABNORMAL HIGH (ref 70–99)
Glucose-Capillary: 143 mg/dL — ABNORMAL HIGH (ref 70–99)

## 2018-06-17 MED ORDER — SODIUM CHLORIDE 0.9 % IV SOLN
1.0000 g | INTRAVENOUS | Status: DC
  Administered 2018-06-17: 1 g via INTRAVENOUS
  Filled 2018-06-17: qty 10

## 2018-06-17 MED ORDER — METRONIDAZOLE 500 MG PO TABS
500.0000 mg | ORAL_TABLET | Freq: Three times a day (TID) | ORAL | Status: DC
  Administered 2018-06-17 – 2018-06-18 (×3): 500 mg via ORAL
  Filled 2018-06-17 (×3): qty 1

## 2018-06-17 MED ORDER — BUPRENORPHINE HCL-NALOXONE HCL 8-2 MG SL SUBL
1.0000 | SUBLINGUAL_TABLET | Freq: Three times a day (TID) | SUBLINGUAL | Status: DC
  Administered 2018-06-17 – 2018-06-18 (×5): 1 via SUBLINGUAL
  Filled 2018-06-17 (×5): qty 1

## 2018-06-17 MED ORDER — POTASSIUM CHLORIDE CRYS ER 20 MEQ PO TBCR
20.0000 meq | EXTENDED_RELEASE_TABLET | Freq: Two times a day (BID) | ORAL | Status: AC
  Administered 2018-06-17 (×2): 20 meq via ORAL
  Filled 2018-06-17 (×2): qty 1

## 2018-06-17 NOTE — Progress Notes (Signed)
   Subjective: HD#2   Overnight: Brief tachycardia to 150s around midnight  Today she feels a lot better but is tired of laying down because it makes her back hurt. She is trying to move around a lot because it makes her feel better. She would like her suboxone schedule adjusted so she gets her morning dose earlier. The redness and pain in her left stump is getting worse even though objectively her labs and vitals are improving. She hasn't had anymore diarrhea but states she didn't eat much of anything yesterday. She is starving this morning.    Objective:  Vital signs in last 24 hours: Vitals:   06/16/18 2151 06/17/18 0025 06/17/18 0339 06/17/18 0443  BP: 116/69   119/72  Pulse: 83 78 82 82  Resp: 16 16 16    Temp: 99.4 F (37.4 C)   99.1 F (37.3 C)  TempSrc: Oral   Oral  SpO2: 96% 95% 94% 95%  Weight:      Height:       Const: In NAD, sitting on bed comfortably Resp: CTABL, no wheezes, crackles, rhonchi  CV: RRR, no MGR Ext: LLE stump still erythematous, warm to tough, NTTP  Assessment/Plan:  Active Problems:   Sepsis (HCC)   Fever and chills   Hypoxia  Ms. 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  #Sepsis 2/2 LLE cellulitis vs urinary: Objectively, there is much improvement as she remain afebrile, leukocytosis resolved. Clinically, she appears stable though on physical exam, the LLE stump erythema seems worse and is still warm to touch. Bacteremia less likely as blood cultures have been negative so far.  -Blood Cx NGTD -Follow-up urine culture -Continue broad-spectrum antibiotics with vancomycin, cefepime and Flagyl  -Follow-up CBC in the a.m.  -Mucinex  #Nonpurulent left lower extremity cellulitis:LLE stump still erythematous, warm to touch but non-tender to palpation. The area of redness seems worse than previously noted. Leukocytosis has however resolved and she has remained afebrile.  -Continue vancomycin, cefepime and Flagyl  #Brief tachycardic episode: She was noted to have a brief episode of tachycardia to the 150s on the tele monitor. On review of Tele, it did not appear to be SVT or Atrial fibrillation. This resolved spontaneously without intervention  -Continue to monitor   #Diarrhea: Resolved -Continue to monitor.  #Hypertension: BP stable -Hold amlodipine  #Insulin-dependent type 2 diabetes mellitus: -Continue Lantus 10 unitsqhs, sliding scale insulin  #COPD:  -Continue Incruse Ellipta, Brovana, albuterol  #Dyspnea - resolved #Mild troponinemia - resolved   #Acute kidney injury: Resolved. -Continue to monitor  #Hypokalemia:K+ 3.4 -Replete  -Follow-up BMP  #History of stage II squamous cell carcinoma of supraglottic larynx status post total laryngectomy -Continue stoma care -Uses electro larynx for phonation  #History of stage IIa non-small cell lung cancer status post stereotactic radiation: -Last follow-up with radiation oncology was at [facility] in 2018.  #Chronic pain syndrome #Fibromyalgia -Continue Lyrica -Continue Suboxone  VXB:LTJQZES electrolytes as needed, carb modified diet VTE ppx:Subcutaneous heparin CODE STATUS:Partial code   Dispo: Anticipated discharge in approximately 1-2 day(s).   Jean Rosenthal, MD 06/17/2018, 6:14 AM Pager: [phone] IMTS PGY-1

## 2018-06-18 ENCOUNTER — Other Ambulatory Visit: Payer: Self-pay | Admitting: Emergency Medicine

## 2018-06-18 DIAGNOSIS — Z888 Allergy status to other drugs, medicaments and biological substances status: Secondary | ICD-10-CM

## 2018-06-18 DIAGNOSIS — E876 Hypokalemia: Secondary | ICD-10-CM

## 2018-06-18 DIAGNOSIS — N179 Acute kidney failure, unspecified: Secondary | ICD-10-CM

## 2018-06-18 LAB — GLUCOSE, CAPILLARY
Glucose-Capillary: 88 mg/dL (ref 70–99)
Glucose-Capillary: 150 mg/dL — ABNORMAL HIGH (ref 70–99)

## 2018-06-18 LAB — BASIC METABOLIC PANEL
Anion gap: 8 (ref 5–15)
BUN: 18 mg/dL (ref 6–20)
CO2: 24 mmol/L (ref 22–32)
Calcium: 8.4 mg/dL — ABNORMAL LOW (ref 8.9–10.3)
Chloride: 108 mmol/L (ref 98–111)
Creatinine, Ser: 0.95 mg/dL (ref 0.44–1.00)
GFR calc Af Amer: >60 mL/min (ref >60)
GFR calc non Af Amer: >60 mL/min (ref >60)
Glucose, Bld: 141 mg/dL — ABNORMAL HIGH (ref 70–99)
Potassium: 4.3 mmol/L (ref 3.5–5.1)
Sodium: 140 mmol/L (ref 135–145)

## 2018-06-18 LAB — CBC
HCT: 39.3 % (ref 36.0–46.0)
Hemoglobin: 13.1 g/dL (ref 12.0–15.0)
MCH: 29.3 pg (ref 26.0–34.0)
MCHC: 33.3 g/dL (ref 30.0–36.0)
MCV: 87.9 fL (ref 80.0–100.0)
Platelets: 176 10*3/uL (ref 150–400)
RBC: 4.47 MIL/uL (ref 3.87–5.11)
RDW: 13.2 % (ref 11.5–15.5)
WBC: 6.3 10*3/uL (ref 4.0–10.5)
nRBC: 0 % (ref 0.0–0.2)

## 2018-06-18 LAB — URINE CULTURE: Culture: >=100000 — AB

## 2018-06-18 MED ORDER — CEPHALEXIN 500 MG PO CAPS: 500.0000 mg | ORAL_CAPSULE | Freq: Two times a day (BID) | ORAL | Status: DC

## 2018-06-18 MED ORDER — CEPHALEXIN 500 MG PO CAPS
500.0000 mg | ORAL_CAPSULE | Freq: Three times a day (TID) | ORAL | Status: DC
  Administered 2018-06-18: 500 mg via ORAL
  Filled 2018-06-18: qty 1

## 2018-06-18 MED ORDER — CEPHALEXIN 500 MG PO CAPS: 500.0000 mg | ORAL_CAPSULE | Freq: Three times a day (TID) | ORAL | 0 refills | Status: DC

## 2018-06-18 NOTE — Progress Notes (Addendum)
   Subjective: HD#3   Overnight: Remained Afebrile.  Today, she reports a cough productive of yellow/green sputum but no sob. She had to take her tube out overnight because it was stopped up. Appetite is very good. No more diarrhea since admission. She is on supplemental oxygen which is new for her. We will try to wean this today. She thinks her chest congestion will clear up quicker as she moves around more. Her leg looks much better today. Discussed possibility for discharge today.   Objective:  Vital signs in last 24 hours: Vitals:   06/17/18 2008 06/17/18 2208 06/17/18 2300 06/18/18 0331  BP:  114/62    Pulse: 76 74    Resp: 20     Temp:  98.5 F (36.9 C)    TempSrc:  Oral    SpO2: 95% 92% 93% 92%  Weight:      Height:       Const: In no acute distress, lying in bed comfortably HEENT: Trach collar in place Resp: Moderate wheezing appreciated on auscultation otherwise unremarkable CV: RRR, no murmurs, gallops, rubs Abd: Bowel sounds present Ext: Improved left lower extremity erythema nontender to palpation.   Assessment/Plan:  Active Problems:   Sepsis ([place])   Fever and chills   Hypoxia  Ms. 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  #Urosepsis:Her presenting picture of sepsis was most likely due to Urosepsis as urine culture is now growing pansensitive E. coli.  Objectively, she has remained afebrile and leukocytosis has resolved. -Transition to Keflex 500mg  TID  x 7days -Blood Cx NGTD  #Nonpurulent left lower extremity cellulitis:Improved erythema with no evidence of purulent drainage.  Continues to remain afebrile  with resolving leukocytosis. -Start Keflex 500 mg BID x 7 days  #Diarrhea:Resolved -Continue to monitor.  #Hypertension: BP stable -Hold amlodipine  #Insulin-dependent type 2 diabetes mellitus: -Continue Lantus 10 unitsqhs, sliding scale insulin  #COPD:  -Continue Incruse Ellipta, Brovana, albuterol  #Dyspnea- resolved #Mild troponinemia- resolved  #Acute kidney injury:Resolved. -Continue to monitor  #Hypokalemia: Resolved. -Follow-up BMP  #History of stage II squamous cell carcinoma of supraglottic larynx status post total laryngectomy -Continue stoma care -Uses electro larynx for phonation   #History of stage IIa non-small cell lung cancer status post stereotactic radiation: -Last follow-up with radiation oncology was at [facility] in 2018.  #Chronic pain syndrome #Fibromyalgia -Continue Lyrica -Continue Suboxone  SVX:BLTJQZE electrolytes as needed, carb modified diet VTE ppx:Subcutaneous heparin CODE STATUS:Partial code   Dispo: Anticipated discharge in approximately1-2day(s).   Jean Rosenthal, MD 06/18/2018, 6:04 AM Pager: [phone] IMTS PGY-1

## 2018-06-18 NOTE — TOC Progression Note (Signed)
Transition of Care [facility]) - Progression Note    Patient Details  Name: [name] MRN: [id number] Date of Birth: Oct 05, 1955  [facility]) CM/SW Contact  Sharin Mons, RN Phone Number: 06/18/2018, 3:57 PM  Clinical Narrative:     Transition to home with family (husband/ son). Pt states son and husband to provide transportation to home. Pt  Is W/C bound.  Expected Discharge Plan: Home/Self Care    Expected Discharge Plan and Services Expected Discharge Plan: Home/Self Care In-house Referral: NA   Post Acute Care Choice: NA Living arrangements for the past 2 months: Single Family Home Expected Discharge Date: 06/18/18               DME Arranged: N/A DME Agency: NA                   Social Determinants of Health (SDOH) Interventions    Readmission Risk Interventions No flowsheet data found.

## 2018-06-18 NOTE — Progress Notes (Signed)
Stevey F Sproles to be D/C'd Home per MD order.  Discussed with the patient and all questions fully answered.  VSS, Skin clean, dry and intact without evidence of skin break down, no evidence of skin tears noted. IV catheter discontinued intact. Site without signs and symptoms of complications. Dressing and pressure applied.  An After Visit Summary was printed and given to the patient. Patient received prescription.  D/c education completed with patient/family including follow up instructions, medication list, d/c activities limitations if indicated, with other d/c instructions as indicated by MD - patient able to verbalize understanding, all questions fully answered.   Patient instructed to return to ED, call 911, or call MD for any changes in condition.   Patient escorted via [place], and D/C home via private auto.  Luci Bank 06/18/2018 5:32 PM

## 2018-06-19 ENCOUNTER — Telehealth: Payer: Self-pay | Admitting: Emergency Medicine

## 2018-06-19 NOTE — Telephone Encounter (Signed)
Returned call to [employer] about [facility] refill.  Informed him Stiolto refill has been sent to requested pharmacy, [facility]. Understanding stated.  Nothing further at this time.

## 2018-06-20 LAB — CULTURE, BLOOD (ROUTINE X 2)
Culture: NO GROWTH
Culture: NO GROWTH
Special Requests: ADEQUATE
Special Requests: ADEQUATE

## 2018-07-08 ENCOUNTER — Telehealth: Payer: Self-pay | Admitting: *Deleted

## 2018-07-08 NOTE — Telephone Encounter (Signed)
Called pt to reschedule CT appt that was CX due to COVID restrictions LMTCB at [phone].

## 2018-12-01 ENCOUNTER — Inpatient Hospital Stay: Admission: RE | Admit: 2018-12-01 | Payer: Medicaid Other | Source: Ambulatory Visit

## 2018-12-08 ENCOUNTER — Other Ambulatory Visit: Payer: Self-pay | Admitting: Emergency Medicine

## 2018-12-09 ENCOUNTER — Telehealth: Payer: Self-pay | Admitting: Emergency Medicine

## 2018-12-09 NOTE — Telephone Encounter (Signed)
ATC patient unable to reach LM to call back office (x1)  Left detailed message patient has not been seen in a year. Needs OV , can be mychart or in office.  Will leave in triage until patient calls back or schedules appt.

## 2018-12-10 ENCOUNTER — Encounter: Payer: Self-pay | Admitting: Pulmonary Disease

## 2018-12-10 ENCOUNTER — Ambulatory Visit (INDEPENDENT_AMBULATORY_CARE_PROVIDER_SITE_OTHER): Payer: Medicaid Other | Admitting: Pulmonary Disease

## 2018-12-10 DIAGNOSIS — Z87891 Personal history of nicotine dependence: Secondary | ICD-10-CM

## 2018-12-10 DIAGNOSIS — R9389 Abnormal findings on diagnostic imaging of other specified body structures: Secondary | ICD-10-CM

## 2018-12-10 DIAGNOSIS — J432 Centrilobular emphysema: Secondary | ICD-10-CM

## 2018-12-10 DIAGNOSIS — C760 Malignant neoplasm of head, face and neck: Secondary | ICD-10-CM

## 2018-12-10 MED ORDER — STIOLTO RESPIMAT 2.5-2.5 MCG/ACT IN AERS: 2.0000 | INHALATION_SPRAY | Freq: Every day | RESPIRATORY_TRACT | 5 refills | Status: DC

## 2018-12-10 MED ORDER — ALBUTEROL SULFATE HFA 108 (90 BASE) MCG/ACT IN AERS: 2.0000 | INHALATION_SPRAY | RESPIRATORY_TRACT | 3 refills | Status: DC | PRN

## 2018-12-10 NOTE — Telephone Encounter (Signed)
Since patient is scheduled for televisit will close encounter.

## 2018-12-10 NOTE — Assessment & Plan Note (Signed)
Plan: Continue to not smoke 

## 2018-12-10 NOTE — Assessment & Plan Note (Signed)
Patient reports that she is doing well clinically She enjoys using Stiolto, she is needing a refill of this  Plan: Refill Stiolto Respimat Refill rescue inhaler Follow-up with Dr. Lamonte Sakai in December/2020

## 2018-12-10 NOTE — Assessment & Plan Note (Signed)
Patient did not complete March/2020 chest CT as ordered by Dr. Lamonte Sakai.  Patient does have recent CT in care everywhere from [facility] on 11/26/2018.  Still showing centrilobular emphysema as well as resolution to the previously seen pulmonary nodules.  Left upper lobe architectural distortion and radiation therapy changes are slightly improved from prior CT in September/2019  Plan: We will monitor this clinically Follow-up with Dr. Lamonte Sakai in December/2020

## 2018-12-10 NOTE — Assessment & Plan Note (Signed)
Plan: Continue follow-up with [facility] ENT

## 2018-12-10 NOTE — Progress Notes (Signed)
Virtual Visit via Telephone Note  I connected with [name] on 12/10/18 at 11:00 AM EDT by telephone and verified that I am speaking with the correct person using two identifiers.  Location: Patient: Home Provider: Office [facility] - [address], [place], [place], [place] [address]   I discussed the limitations, risks, security and privacy concerns of performing an evaluation and management service by telephone and the availability of in person appointments. I also discussed with the patient that there may be a patient responsible charge related to this service. The patient expressed understanding and agreed to proceed.  Patient consented to consult via telephone: Yes People present and their role in pt care: Pt    History of Present Illness:  63 year old female former smoker followed in our office for COPD, non-small cell cancer of the left lung as well as an abnormal CT chest  Past medical history: Hyperlipidemia, morbid obesity, depression, chronic pain syndrome (managed on Suboxone and Lyrica), diabetes, diabetic neuropathy, SIRS Smoking history: Former smoker.  Quit 2017.  40-pack-year smoking history Maintenance: Stiolto Respimat Patient of Dr. Lamonte Sakai  Chief complaint:    63 year old female former smoker followed in our office for COPD, non-small cell cancer of the left lung as well as an abnormal CT chest.  She was last seen by Dr. Lamonte Sakai October/2019.  Patient reports that she has been doing well since last being seen by our office.  She has reestablished care with [facility].  See the chart review information listed below.  Patient did not complete ordered CT by Dr. Lamonte Sakai March/2020.  She does have a recent CT though in care everywhere on 11/26/2018 that shows improved imaging as well as resolution of pulmonary nodules.  Patient has no acute concerns today.  She would just like refills of her inhalers with Stiolto as well as her rescue inhaler.  She reports she  has to use her rescue inhaler couple of times a week.  Chart review: 12/08/2018-reestablish care with [facility]NT -Dr. Donald Pore Impression and plan from that office note: Patient with [id number] laryngeal cancer status post laryngectomy and bilateral neck dissection on 05/22/2015.  She did not receive any adjuvant radiotherapy.  Lost to follow-up for some time.  She reestablish care in September/2020 with stomal stenosis, mild intermittent hemoptysis and new midline neck nodule. Due to former smoker Quit 2017 40-pack-year smoking history moderate to severe stenosis and trouble replacing her own pharyngeal tube she likely would benefit from stoma plasty in near future  Observations/Objective:  09/25/2015-CBC with differential-eosinophils relative 4, eosinophils absolute 0.3 06/15/2018-CBC with differential-eosinophils relative 1, eosinophils absolute 0.1  11/18/2017-CT chest without contrast-linear thickening along intralung fissure with associated mild nodularity in the left upper lobe with no comparison available, favor post radiation change however perifissural nodularity warrants follow-up, a 7 mm pulmonary nodule in the right upper lobe is not mentioned on prior CT report, recommend attention to follow-up  11/26/2018-CT chest with contrast-[facility]-centrilobular emphysema, architectural distortion and radiation therapy changes to the left upper lobe are slightly improved from prior CT, interval resolution of the majority of the previously noted groundglass nodules including a 6 mm groundglass opacity in the left apex, a few scattered bilateral micronodules measuring up to 3 mm are unchanged from prior, triangular radiodensity noted along the minor fissure consistent with lymph node  Assessment and Plan:  Former smoker Plan: Continue to not smoke  Abnormal CT of the chest Patient did not complete March/2020  chest CT as ordered by Dr. Lamonte Sakai.  Patient does have recent CT  in care everywhere from [facility] on 11/26/2018.  Still showing centrilobular emphysema as well as resolution to the previously seen pulmonary nodules.  Left upper lobe architectural distortion and radiation therapy changes are slightly improved from prior CT in September/2019  Plan: We will monitor this clinically Follow-up with Dr. Lamonte Sakai in December/2020  Head and neck cancer [facility]) Plan: Continue follow-up with [facility] ENT  COPD (chronic obstructive pulmonary disease) [facility]) Patient reports that she is doing well clinically She enjoys using Stiolto, she is needing a refill of this  Plan: Refill Stiolto Respimat Refill rescue inhaler Follow-up with Dr. Lamonte Sakai in December/2020   Follow Up Instructions:  Return in about 2 months (around 02/09/2019), or if symptoms worsen or fail to improve, for Follow up with Dr. Lamonte Sakai.   I discussed the assessment and treatment plan with the patient. The patient was provided an opportunity to ask questions and all were answered. The patient agreed with the plan and demonstrated an understanding of the instructions.   The patient was advised to call back or seek an in-person evaluation if the symptoms worsen or if the condition fails to improve as anticipated.  I provided 28 minutes of non-face-to-face time during this encounter.   Lauraine Rinne, NP

## 2018-12-10 NOTE — Patient Instructions (Signed)
You were seen today by Lauraine Rinne, NP  for:   1. Centrilobular emphysema (HCC)  - albuterol (VENTOLIN HFA) 108 (90 Base) MCG/ACT inhaler; Inhale 2 puffs into the lungs every 4 (four) hours as needed for wheezing or shortness of breath.  Dispense: 18 g; Refill: 3 - Tiotropium Bromide-Olodaterol (STIOLTO RESPIMAT) 2.5-2.5 MCG/ACT AERS; Inhale 2 puffs into the lungs daily.  Dispense: 4 g; Refill: 5  Note your daily symptoms > remember "red flags" for COPD:   >>>Increase in cough >>>increase in sputum production >>>increase in shortness of breath or activity  intolerance.   If you notice these symptoms, please call the office to be seen.    2. Abnormal CT of the chest  Improved CT imaging on 11/26/2018.  We will continue to monitor this clinically.  3. Head and neck cancer ([place])  Continue to follow up with [facility] ENT  4. Former smoker  Continue to not smoke   We recommend today:  No orders of the defined types were placed in this encounter.  No orders of the defined types were placed in this encounter.  Meds ordered this encounter  Medications  . albuterol (VENTOLIN HFA) 108 (90 Base) MCG/ACT inhaler    Sig: Inhale 2 puffs into the lungs every 4 (four) hours as needed for wheezing or shortness of breath.    Dispense:  18 g    Refill:  3  . Tiotropium Bromide-Olodaterol (STIOLTO RESPIMAT) 2.5-2.5 MCG/ACT AERS    Sig: Inhale 2 puffs into the lungs daily.    Dispense:  4 g    Refill:  5    Follow Up:    Return in about 2 months (around 02/09/2019), or if symptoms worsen or fail to improve, for Follow up with Dr. Lamonte Sakai.   Please do your part to reduce the spread of COVID-19:      Reduce your risk of any infection  and COVID19 by using the similar precautions used for avoiding the common cold or flu:  Marland Kitchen Wash your hands often with soap and warm water for at least 20 seconds.  If soap and water are not readily available, use an alcohol-based hand sanitizer with at  least 60% alcohol.  . If coughing or sneezing, cover your mouth and nose by coughing or sneezing into the elbow areas of your shirt or coat, into a tissue or into your sleeve (not your hands). Langley Gauss A MASK when in public  . Avoid shaking hands with others and consider head nods or verbal greetings only. . Avoid touching your eyes, nose, or mouth with unwashed hands.  . Avoid close contact with people who are sick. . Avoid places or events with large numbers of people in one location, like concerts or sporting events. . If you have some symptoms but not all symptoms, continue to monitor at home and seek medical attention if your symptoms worsen. . If you are having a medical emergency, call 911.   [place] / e-Visit: [email or web address]         [facility]: [facility]: [phone]                   [facility]: [phone]     It is flu season:   >>> Best ways to protect herself from the flu: Receive the yearly flu vaccine, practice good hand hygiene washing with soap and also using hand  sanitizer when available, eat a nutritious meals, get adequate rest, hydrate appropriately   Please contact the office if your symptoms worsen or you have concerns that you are not improving.   Thank you for choosing [facility] for your healthcare, and for allowing [place] to partner with you on your healthcare journey. I am thankful to be able to provide care to you today.   Wyn Quaker FNP-C

## 2018-12-10 NOTE — Telephone Encounter (Signed)
Pt has televisit for today with Wyn Quaker.

## 2019-02-05 ENCOUNTER — Other Ambulatory Visit: Payer: Self-pay

## 2019-02-05 ENCOUNTER — Ambulatory Visit: Payer: Medicaid Other | Admitting: Emergency Medicine

## 2019-02-05 ENCOUNTER — Encounter: Payer: Self-pay | Admitting: Emergency Medicine

## 2019-02-05 VITALS — BP 134/82 | HR 80 | Ht 66.0 in | Wt 220.0 lb

## 2019-02-05 DIAGNOSIS — J432 Centrilobular emphysema: Secondary | ICD-10-CM

## 2019-02-05 DIAGNOSIS — C3492 Malignant neoplasm of unspecified part of left bronchus or lung: Secondary | ICD-10-CM | POA: Diagnosis not present

## 2019-02-05 DIAGNOSIS — Z85118 Personal history of other malignant neoplasm of bronchus and lung: Secondary | ICD-10-CM

## 2019-02-05 DIAGNOSIS — C329 Malignant neoplasm of larynx, unspecified: Secondary | ICD-10-CM

## 2019-02-05 DIAGNOSIS — Z23 Encounter for immunization: Secondary | ICD-10-CM

## 2019-02-05 MED ORDER — STIOLTO RESPIMAT 2.5-2.5 MCG/ACT IN AERS: 2.0000 | INHALATION_SPRAY | Freq: Every day | RESPIRATORY_TRACT | 12 refills | Status: DC

## 2019-02-05 MED ORDER — ALBUTEROL SULFATE HFA 108 (90 BASE) MCG/ACT IN AERS: 2.0000 | INHALATION_SPRAY | RESPIRATORY_TRACT | 5 refills | Status: DC | PRN

## 2019-02-05 NOTE — Assessment & Plan Note (Signed)
Continue Stiolto as ordered.  No exacerbations.  No significant secretions.  Albuterol as needed

## 2019-02-05 NOTE — Assessment & Plan Note (Signed)
She is following again at ENT at [facility].  She had some apparent tracheal stenosis and this is going to be addressed surgically later this month

## 2019-02-05 NOTE — Patient Instructions (Addendum)
Good luck with your airway surgery coming up later this month. Please continue Stiolto 2 puffs once daily. Keep your albuterol available to use 2 puffs if needed for shortness of breath, chest tightness, wheezing. We need to repeat your CT scan of the chest without contrast in April for surveillance following your radiation treatment Flu shot today Pneumonia shot is up-to-date Please follow-up here in April after your CT scan so we can review the results together.

## 2019-02-05 NOTE — Progress Notes (Signed)
Subjective:    Patient ID: [name], female    DOB: 1955-04-30, 63 y.o.   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  Her last CT chest available in our system was done 11/14/2017, but a CT chest from 11/26/2018 done in the [facility] system showed no evidence of recurrent disease, persistent left upper lobe scar from radiation, resolution of a left apical 9 mm groundglass nodule.  She is being followed by ENT Dr. Donald Pore at [facility], had some evidence of stomal stenosis and a new midline neck nodule. Planning for surgical repair this month.  Current regimen includes Stiolto and albuterol which she uses approximately 1-2x a day. No flares, pred, abx. Minimal sputum, white to beige. Needs her flu shot. PNA shot up to date.     Review of Systems  Constitutional: Negative for fever and unexpected weight change.  HENT: Negative for congestion, dental problem, ear pain, nosebleeds, postnasal drip, rhinorrhea, sinus pressure, sneezing, sore throat and trouble swallowing.   Eyes: Negative for redness and itching.  Respiratory: Positive for cough, chest tightness, shortness of breath and wheezing.   Cardiovascular: Negative for palpitations and leg swelling.  Gastrointestinal: Negative for nausea and vomiting.  Genitourinary: Negative for dysuria.  Musculoskeletal: Negative for joint swelling.  Skin: Negative for rash.  Neurological: Negative for headaches.  Hematological: Does not bruise/bleed easily.  Psychiatric/Behavioral: Negative for dysphoric mood. The patient is not nervous/anxious.     Past Medical History:  Diagnosis Date  . Anxiety   . Arthritis   . COPD (chronic obstructive pulmonary disease) ([place])   . Depression   . Diabetes mellitus without complication ([place])    INSULIN DEPENDENT  . Diabetic neuropathy ([place])    . Difficult intubation    total laryngectomy  . Fibromyalgia   . Head and neck cancer ([place]) 04/19/2015  . Hypertension   . Kidney stone   . Morbid obesity with BMI of 40.0-44.9, adult ([place])   . Neuropathy   . Nocturia   . Pneumonia   . Rash, skin    face  . Renal insufficiency   . Shortness of breath   . Sleep apnea    has not used cpap past 10 yrs  . Urge incontinence      Family History  Adopted: Yes     Social History   Socioeconomic History  . Marital status: Married    Spouse name: Not on file  . Number of children: Not on file  . Years of education: Not on file  . Highest education level: Not on file  Occupational History  . Not on file  Tobacco Use  . Smoking status: Former Smoker    Packs/day: 1.00    Years: 40.00    Pack years: 40.00    Types: Cigarettes    Quit date: 05/22/2015    Years since quitting: 3.7  . Smokeless tobacco: Never Used  Substance and Sexual Activity  . Alcohol use: No  . Drug use: No  . Sexual activity: Not on file  Other Topics Concern  . Not on file  Social History Narrative  . Not on file   Social Determinants of Health   Financial Resource Strain:   . Difficulty of Paying Living Expenses: Not on file  Food Insecurity:   . Worried About Charity fundraiser in  the Last Year: Not on file  . Ran Out of Food in the Last Year: Not on file  Transportation Needs:   . Lack of Transportation (Medical): Not on file  . Lack of Transportation (Non-Medical): Not on file  Physical Activity:   . Days of Exercise per Week: Not on file  . Minutes of Exercise per Session: Not on file  Stress:   . Feeling of Stress : Not on file  Social Connections:   . Frequency of Communication with Friends and Family: Not on file  . Frequency of Social Gatherings with Friends and Family: Not on file  . Attends Religious Services: Not on file  . Active Member of Clubs or Organizations: Not on file  . Attends Archivist Meetings: Not on  file  . Marital Status: Not on file  Intimate Partner Violence:   . Fear of Current or Ex-Partner: Not on file  . Emotionally Abused: Not on file  . Physically Abused: Not on file  . Sexually Abused: Not on file     Allergies  Allergen Reactions  . Ace Inhibitors Anaphylaxis and Swelling    Angioedema  . Zestril [Lisinopril] Anaphylaxis and Swelling    Angioedema     Outpatient Medications Prior to Visit  Medication Sig Dispense Refill  . amLODipine (NORVASC) 10 MG tablet take 1 tablet (10 mg) by oral route once daily (Patient taking differently: Take 10 mg by mouth daily. ) 30 tablet 0  . blood glucose meter kit and supplies KIT Dispense based on patient and insurance preference. Use up to four times daily as directed. (FOR ICD-9 250.00, 250.01). 1 each 0  . Buprenorphine HCl-Naloxone HCl (SUBOXONE) 8-2 MG FILM Place 1 Film under the tongue 3 (three) times daily.     . cephALEXin (KEFLEX) 500 MG capsule Take 1 capsule (500 mg total) by mouth every 8 (eight) hours. 21 capsule 0  . hydrochlorothiazide (HYDRODIURIL) 25 MG tablet Take 25 mg by mouth daily.    Marland Kitchen ibuprofen (ADVIL,MOTRIN) 200 MG tablet Take 400-600 mg by mouth every 6 (six) hours as needed for fever, headache or moderate pain.     Marland Kitchen insulin aspart (NOVOLOG FLEXPEN) 100 UNIT/ML FlexPen Inject 3 Units into the skin 3 (three) times daily with meals.    . Insulin Glargine (LANTUS SOLOSTAR) 100 UNIT/ML Solostar Pen Inject 17 Units into the skin daily. At 10AM 15 mL 3  . Insulin Pen Needle (PEN NEEDLES 3/16") 31G X 5 MM MISC Use as directed. 100 each 3  . Multiple Vitamin (MULTIVITAMIN) tablet Take 1 tablet by mouth daily with supper.     . pregabalin (LYRICA) 50 MG capsule Take 1 capsule (50 mg total) by mouth 3 (three) times daily. (Patient taking differently: Take 75 mg by mouth 2 (two) times daily. ) 90 capsule 0  . albuterol (VENTOLIN HFA) 108 (90 Base) MCG/ACT inhaler Inhale 2 puffs into the lungs every 4 (four) hours as  needed for wheezing or shortness of breath. 18 g 3  . polyethylene glycol (MIRALAX) packet Take 17 g by mouth daily. (Patient not taking: Reported on 06/15/2018) 14 each 0  . Tiotropium Bromide-Olodaterol (STIOLTO RESPIMAT) 2.5-2.5 MCG/ACT AERS Inhale 2 puffs into the lungs daily. 4 g 5   No facility-administered medications prior to visit.        Objective:   Physical Exam Vitals:   02/05/19 1442  BP: 134/82  Pulse: 80  SpO2: 95%  Weight: 220 lb (99.8 kg)  Height:  5' 6"  (1.676 m)   Gen: Pleasant, well-nourished, in no distress,  normal affect, uses a wheelchair, can communicate with electronic larynx device  ENT: No lesions,  mouth clear,  oropharynx clear, no postnasal drip  Neck: Stoma is well-appearing without any lesions.  No secretions noted  Lungs: No use of accessory muscles, coarse B, no wheezes  Cardiovascular: RRR, heart sounds normal, no murmur or gallops, no peripheral edema  Musculoskeletal: status post left BKA  Neuro: alert, non focal  Skin: Warm, no lesions or rash      Assessment & Plan:  Laryngeal cancer [facility]) She is following again at ENT at [facility].  She had some apparent tracheal stenosis and this is going to be addressed surgically later this month  Non-small cell cancer of left lung [facility]) She wants to have her surveillance CTs done here in [place].  Next needs to be done in April 2021.  Follow-up with me afterward  COPD (chronic obstructive pulmonary disease) ([place]) Continue Stiolto as ordered.  No exacerbations.  No significant secretions.  Albuterol as needed  Baltazar Apo, MD, PhD 02/05/2019, 2:56 PM [facility] [phone] or if no answer [phone]

## 2019-02-05 NOTE — Assessment & Plan Note (Signed)
She wants to have her surveillance CTs done here in [place].  Next needs to be done in April 2021.  Follow-up with me afterward

## 2019-06-01 ENCOUNTER — Telehealth: Payer: Self-pay | Admitting: Emergency Medicine

## 2019-06-01 DIAGNOSIS — J432 Centrilobular emphysema: Secondary | ICD-10-CM

## 2019-06-01 MED ORDER — ALBUTEROL SULFATE HFA 108 (90 BASE) MCG/ACT IN AERS: 2.0000 | INHALATION_SPRAY | RESPIRATORY_TRACT | 5 refills | Status: DC | PRN

## 2019-06-01 MED ORDER — STIOLTO RESPIMAT 2.5-2.5 MCG/ACT IN AERS: 2.0000 | INHALATION_SPRAY | Freq: Every day | RESPIRATORY_TRACT | 5 refills | Status: DC

## 2019-06-01 NOTE — Telephone Encounter (Signed)
ATC the pt, LMTCB x 1

## 2019-06-01 NOTE — Telephone Encounter (Signed)
Spoke with the pt's spouse to verify the msg  She is needing refill on stiolto and albuterol  Rxs were sent  Nothing further needed

## 2019-06-08 ENCOUNTER — Inpatient Hospital Stay: Admission: RE | Admit: 2019-06-08 | Payer: Medicaid Other | Source: Ambulatory Visit

## 2019-06-23 ENCOUNTER — Encounter: Payer: Self-pay | Admitting: Emergency Medicine

## 2019-06-23 ENCOUNTER — Other Ambulatory Visit: Payer: Self-pay

## 2019-06-23 ENCOUNTER — Ambulatory Visit: Payer: Medicaid Other | Admitting: Emergency Medicine

## 2019-06-23 DIAGNOSIS — J432 Centrilobular emphysema: Secondary | ICD-10-CM | POA: Diagnosis not present

## 2019-06-23 DIAGNOSIS — C3492 Malignant neoplasm of unspecified part of left bronchus or lung: Secondary | ICD-10-CM | POA: Diagnosis not present

## 2019-06-23 DIAGNOSIS — C329 Malignant neoplasm of larynx, unspecified: Secondary | ICD-10-CM | POA: Diagnosis not present

## 2019-06-23 MED ORDER — AZITHROMYCIN 250 MG PO TABS: 250.0000 mg | ORAL_TABLET | Freq: Every day | ORAL | 0 refills | Status: DC

## 2019-06-23 NOTE — Patient Instructions (Signed)
We will repeat your CT scan of the chest, surveillance for your history of lung cancer Please continue Stiolto 2 puffs once daily. Keep your albuterol available to use every 4 hours if needed for shortness of breath, chest tightness, wheezing. Please take azithromycin, Z-Pak as directed until completely gone. Recommend that you proceed with getting your COVID-19 vaccinations.  We will try to help you set this up. Follow with Dr Lamonte Sakai next available after your CT scan of the chest to review the results together.

## 2019-06-23 NOTE — Assessment & Plan Note (Signed)
Status post SBRT.  She wants to get her surveillance CT scans here in [place] and I will order for now.  Follow next available to review.

## 2019-06-23 NOTE — Progress Notes (Signed)
Subjective:    Patient ID: [name], female    DOB: Oct 30, 1955, 64 y.o.   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  Her last CT chest available in our system was done 11/14/2017, but a CT chest from 11/26/2018 done in the [facility] system showed no evidence of recurrent disease, persistent left upper lobe scar from radiation, resolution of a left apical 9 mm groundglass nodule.  She is being followed by ENT Dr. Donald Pore at [facility], had some evidence of stomal stenosis and a new midline neck nodule. Planning for surgical repair this month.  Current regimen includes Stiolto and albuterol which she uses approximately 1-2x a day. No flares, pred, abx. Minimal sputum, white to beige. Needs her flu shot. PNA shot up to date.   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  Last time she was planning to undergo ENT surgery for stomal stenosis, but this never had to be done. Follows w ENT at [facility]. Most recent CT imaging 11/26/2018 at [facility], report reviewed by me, showed radiation changes in the left upper lobe interval resolution of groundglass nodule. She wants to get her surveillance Ct's here in [place].  She has been managed on Stiolto, albuterol as needed, uses approximately 4x a day currently She is having increased cough last 2 months, some increased SOB and weakness, fatigue. No abx or prednisone since last time.  She hasn't had COVID vaccines yet.  She is not smoking - has a lot of 2nd hand exposure.   ENT notes reviewed from [place]     Review of Systems  Constitutional: Negative for fever and unexpected weight change.  HENT: Negative for congestion, dental problem, ear pain,  nosebleeds, postnasal drip, rhinorrhea, sinus pressure, sneezing, sore throat and trouble swallowing.   Eyes: Negative for redness and itching.  Respiratory: Positive for cough, chest tightness, shortness of breath and wheezing.   Cardiovascular: Negative for palpitations and leg swelling.  Gastrointestinal: Negative for nausea and vomiting.  Genitourinary: Negative for dysuria.  Musculoskeletal: Negative for joint swelling.  Skin: Negative for rash.  Neurological: Negative for headaches.  Hematological: Does not bruise/bleed easily.  Psychiatric/Behavioral: Negative for dysphoric mood. The patient is not nervous/anxious.     Past Medical History:  Diagnosis Date  . Anxiety   . Arthritis   . COPD (chronic obstructive pulmonary disease) ([place])   . Depression   . Diabetes mellitus without complication ([place])    INSULIN DEPENDENT  . Diabetic neuropathy ([place])   . Difficult intubation    total laryngectomy  . Fibromyalgia   . Head and neck cancer ([place]) 04/19/2015  . Hypertension   . Kidney stone   . Morbid obesity with BMI of 40.0-44.9, adult ([place])   . Neuropathy   . Nocturia   . Pneumonia   . Rash, skin    face  . Renal insufficiency   . Shortness of breath   . Sleep apnea    has not used cpap past 10 yrs  . Urge incontinence      Family History  Adopted: Yes     Social History   Socioeconomic History  . Marital status: Married    Spouse name: Not on file  .  Number of children: Not on file  . Years of education: Not on file  . Highest education level: Not on file  Occupational History  . Not on file  Tobacco Use  . Smoking status: Former Smoker    Packs/day: 1.00    Years: 40.00    Pack years: 40.00    Types: Cigarettes    Quit date: 05/22/2015    Years since quitting: 4.0  . Smokeless tobacco: Never Used  Substance and Sexual Activity  . Alcohol use: No  . Drug use: No  . Sexual activity: Not on file  Other Topics Concern  . Not on file  Social History  Narrative  . Not on file   Social Determinants of Health   Financial Resource Strain:   . Difficulty of Paying Living Expenses:   Food Insecurity:   . Worried About Charity fundraiser in the Last Year:   . Arboriculturist in the Last Year:   Transportation Needs:   . Film/video editor (Medical):   Marland Kitchen Lack of Transportation (Non-Medical):   Physical Activity:   . Days of Exercise per Week:   . Minutes of Exercise per Session:   Stress:   . Feeling of Stress :   Social Connections:   . Frequency of Communication with Friends and Family:   . Frequency of Social Gatherings with Friends and Family:   . Attends Religious Services:   . Active Member of Clubs or Organizations:   . Attends Archivist Meetings:   Marland Kitchen Marital Status:   Intimate Partner Violence:   . Fear of Current or Ex-Partner:   . Emotionally Abused:   Marland Kitchen Physically Abused:   . Sexually Abused:      Allergies  Allergen Reactions  . Ace Inhibitors Anaphylaxis and Swelling    Angioedema  . Zestril [Lisinopril] Anaphylaxis and Swelling    Angioedema     Outpatient Medications Prior to Visit  Medication Sig Dispense Refill  . albuterol (VENTOLIN HFA) 108 (90 Base) MCG/ACT inhaler Inhale 2 puffs into the lungs every 4 (four) hours as needed for wheezing or shortness of breath. 18 g 5  . amLODipine (NORVASC) 10 MG tablet take 1 tablet (10 mg) by oral route once daily (Patient taking differently: Take 10 mg by mouth daily. ) 30 tablet 0  . blood glucose meter kit and supplies KIT Dispense based on patient and insurance preference. Use up to four times daily as directed. (FOR ICD-9 250.00, 250.01). 1 each 0  . Buprenorphine HCl-Naloxone HCl (SUBOXONE) 8-2 MG FILM Place 1 Film under the tongue 3 (three) times daily.     . cephALEXin (KEFLEX) 500 MG capsule Take 1 capsule (500 mg total) by mouth every 8 (eight) hours. 21 capsule 0  . hydrochlorothiazide (HYDRODIURIL) 25 MG tablet Take 25 mg by mouth daily.      Marland Kitchen ibuprofen (ADVIL,MOTRIN) 200 MG tablet Take 400-600 mg by mouth every 6 (six) hours as needed for fever, headache or moderate pain.     Marland Kitchen insulin aspart (NOVOLOG FLEXPEN) 100 UNIT/ML FlexPen Inject 3 Units into the skin 3 (three) times daily with meals.    . Insulin Glargine (LANTUS SOLOSTAR) 100 UNIT/ML Solostar Pen Inject 17 Units into the skin daily. At 10AM 15 mL 3  . Insulin Pen Needle (PEN NEEDLES 3/16") 31G X 5 MM MISC Use as directed. 100 each 3  . Multiple Vitamin (MULTIVITAMIN) tablet Take 1 tablet by mouth daily with supper.     Marland Kitchen  pregabalin (LYRICA) 50 MG capsule Take 1 capsule (50 mg total) by mouth 3 (three) times daily. (Patient taking differently: Take 75 mg by mouth 2 (two) times daily. ) 90 capsule 0  . Tiotropium Bromide-Olodaterol (STIOLTO RESPIMAT) 2.5-2.5 MCG/ACT AERS Inhale 2 puffs into the lungs daily. 4 g 5   No facility-administered medications prior to visit.        Objective:   Physical Exam Vitals:   06/23/19 0917  BP: (!) 162/90  Pulse: 96  Temp: 98.1 F (36.7 C)  TempSrc: Temporal  SpO2: 94%  Weight: 225 lb (102.1 kg)  Height: 5' 6"  (1.676 m)   Gen: Pleasant, well-nourished, in no distress,  normal affect, communicating with electronic larynx device  ENT: No lesions,  mouth clear,  oropharynx clear, no postnasal drip  Neck: Stoma is well-appearing without any lesions.  No secretions noted  Lungs: No use of accessory muscles, coarse B, no wheezes  Cardiovascular: RRR, heart sounds normal, no murmur or gallops, no peripheral edema  Musculoskeletal: status post left BKA  Neuro: alert, non focal  Skin: Warm, no lesions or rash      Assessment & Plan:  COPD (chronic obstructive pulmonary disease) (H[facility]) With flaring chronic bronchitic symptoms.  No bronchospasm on exam.  Question whether she may benefit going forward from cyclical antibiotics.  I will treat her with azithromycin now and follow.  Defer prednisone in absence of any  wheezing Continue Stiolto, albuterol.  Hopefully her albuterol use will go down when her mucus burden improves.  Non-small cell cancer of left lung (HCC) Status post SBRT.  She wants to get her surveillance CT scans here in [place] and I will order for now.  Follow next available to review.  Laryngeal cancer [facility]) Post laryngectomy.  She never had to get her stoma revised, follows reliably with ENT.  Baltazar Apo, MD, PhD 06/23/2019, 9:39 AM [place] [phone] or if no answer [phone]

## 2019-06-23 NOTE — Assessment & Plan Note (Addendum)
With flaring chronic bronchitic symptoms.  No bronchospasm on exam.  Question whether she may benefit going forward from cyclical antibiotics.  I will treat her with azithromycin now and follow.  Defer prednisone in absence of any wheezing Continue Stiolto, albuterol.  Hopefully her albuterol use will go down when her mucus burden improves.

## 2019-06-23 NOTE — Addendum Note (Signed)
Addended by: Tery Sanfilippo R on: 06/23/2019 09:47 AM   Modules accepted: Orders

## 2019-06-23 NOTE — Assessment & Plan Note (Signed)
Post laryngectomy.  She never had to get her stoma revised, follows reliably with ENT.

## 2019-07-02 ENCOUNTER — Ambulatory Visit (HOSPITAL_COMMUNITY): Admission: RE | Admit: 2019-07-02 | Payer: Medicaid Other | Source: Ambulatory Visit

## 2019-07-08 ENCOUNTER — Other Ambulatory Visit: Payer: Medicaid Other

## 2019-07-15 ENCOUNTER — Ambulatory Visit: Payer: Medicaid Other | Admitting: Emergency Medicine

## 2019-07-16 ENCOUNTER — Telehealth: Payer: Self-pay | Admitting: Emergency Medicine

## 2019-07-16 ENCOUNTER — Ambulatory Visit (HOSPITAL_COMMUNITY): Payer: Medicaid Other

## 2019-07-16 NOTE — Telephone Encounter (Signed)
Left a voicemail for patient regarding prior message regarding CT scan that she had cancelled. Asked patient to contact our office back to let us know if she was going to reschedule.

## 2019-07-19 NOTE — Telephone Encounter (Signed)
ATC patient X2 about CT LMTCB

## 2019-07-20 NOTE — Telephone Encounter (Signed)
Pt's CT was cancelled 5/21.  Called and spoke with pt's husband to see if pt was ready to reschedule the CT. He stated that they are having car trouble and stated once they had a drivable vehicle they would call to reschedule CT. Nothing further needed at this time.

## 2019-08-03 ENCOUNTER — Telehealth: Payer: Self-pay | Admitting: Emergency Medicine

## 2019-08-03 NOTE — Telephone Encounter (Signed)
PA request was received from (pharmacy): [facility] TIWPY:[phone]  Medication name and strength: Albuterol Sulfate HFA 108 (90 base) mcg/act Ordering Provider: Dr. Lamonte Sakai  Was PA started with [facility]?: yes If yes, please enter KEY: [id number] Medication tried and failed: None Covered Alternatives: Albuterol 0.63 mg/70ml, Albuterol 1.25mg /46ml, Albuterol sulfate 2.5mg /0.38ml, Albuterol Sulfate 5mg /ml, proair HFA inhaler  PA sent to plan, time frame for approval / denial: 5 business days Routing to [place] for follow-up

## 2019-08-17 MED ORDER — ALBUTEROL SULFATE HFA 108 (90 BASE) MCG/ACT IN AERS: 2.0000 | INHALATION_SPRAY | Freq: Four times a day (QID) | RESPIRATORY_TRACT | 5 refills | Status: DC | PRN

## 2019-08-17 NOTE — Telephone Encounter (Signed)
Yes please

## 2019-08-17 NOTE — Addendum Note (Signed)
Addended by: Robbie Louis on: 08/17/2019 03:39 PM   Modules accepted: Orders

## 2019-08-17 NOTE — Telephone Encounter (Signed)
Patient called, Pro Air ordered, sent to preferred pharmacy. Patient verbalized understanding.

## 2019-08-17 NOTE — Telephone Encounter (Signed)
Called [employer] regarding PA for Albuterol Sulfate HFA.  PA was denied, patient must try and fail Proair HFA prior to being approved for Albuterol HFA.  Reference # [id number].  Dr. Lamonte Sakai, Do you want to order Proair for this patient since the PA was denied?  Please advise.  Thank you.

## 2019-10-28 ENCOUNTER — Emergency Department (HOSPITAL_COMMUNITY): Payer: Medicaid Other

## 2019-10-28 ENCOUNTER — Other Ambulatory Visit: Payer: Self-pay

## 2019-10-28 ENCOUNTER — Emergency Department (HOSPITAL_COMMUNITY)
Admission: EM | Admit: 2019-10-28 | Discharge: 2019-10-29 | Disposition: A | Discharge status: Home / Self Care | Payer: Medicaid Other | Attending: Emergency Medicine | Admitting: Emergency Medicine

## 2019-10-28 DIAGNOSIS — Y939 Activity, unspecified: Secondary | ICD-10-CM | POA: Diagnosis not present

## 2019-10-28 DIAGNOSIS — M79601 Pain in right arm: Secondary | ICD-10-CM | POA: Insufficient documentation

## 2019-10-28 DIAGNOSIS — M25561 Pain in right knee: Secondary | ICD-10-CM | POA: Insufficient documentation

## 2019-10-28 DIAGNOSIS — S0181XA Laceration without foreign body of other part of head, initial encounter: Secondary | ICD-10-CM | POA: Diagnosis present

## 2019-10-28 DIAGNOSIS — W06XXXA Fall from bed, initial encounter: Secondary | ICD-10-CM | POA: Diagnosis not present

## 2019-10-28 DIAGNOSIS — Z5321 Procedure and treatment not carried out due to patient leaving prior to being seen by health care provider: Secondary | ICD-10-CM | POA: Diagnosis not present

## 2019-10-28 DIAGNOSIS — Y929 Unspecified place or not applicable: Secondary | ICD-10-CM | POA: Diagnosis not present

## 2019-10-28 DIAGNOSIS — M25551 Pain in right hip: Secondary | ICD-10-CM | POA: Insufficient documentation

## 2019-10-28 DIAGNOSIS — Y999 Unspecified external cause status: Secondary | ICD-10-CM | POA: Diagnosis not present

## 2019-10-28 NOTE — ED Triage Notes (Signed)
Patient stated she fell asleep seating up and fell off the bed this afternoon. Sustained a laceration on top of head; c/o right arm, hip and knee pain as well.

## 2019-10-29 NOTE — ED Notes (Signed)
Pt called by radiology staff x 2. Unable to locate

## 2019-10-29 NOTE — ED Notes (Signed)
Pt called x 3 with no answer

## 2020-02-10 ENCOUNTER — Other Ambulatory Visit: Payer: Self-pay | Admitting: Emergency Medicine

## 2020-02-10 DIAGNOSIS — J432 Centrilobular emphysema: Secondary | ICD-10-CM

## 2020-03-09 ENCOUNTER — Other Ambulatory Visit: Payer: Self-pay | Admitting: *Deleted

## 2020-03-09 ENCOUNTER — Other Ambulatory Visit: Payer: Self-pay | Admitting: Emergency Medicine

## 2020-03-09 DIAGNOSIS — J432 Centrilobular emphysema: Secondary | ICD-10-CM

## 2020-03-09 MED ORDER — STIOLTO RESPIMAT 2.5-2.5 MCG/ACT IN AERS: INHALATION_SPRAY | RESPIRATORY_TRACT | 2 refills | Status: DC

## 2020-04-05 ENCOUNTER — Other Ambulatory Visit: Payer: Self-pay | Admitting: Emergency Medicine

## 2020-04-19 ENCOUNTER — Other Ambulatory Visit: Payer: Self-pay | Admitting: Nurse Practitioner

## 2020-04-19 DIAGNOSIS — Z1231 Encounter for screening mammogram for malignant neoplasm of breast: Secondary | ICD-10-CM

## 2020-04-24 ENCOUNTER — Ambulatory Visit: Payer: Medicaid Other | Admitting: Podiatry

## 2020-05-11 ENCOUNTER — Other Ambulatory Visit: Payer: Self-pay | Admitting: Emergency Medicine

## 2020-05-11 DIAGNOSIS — J432 Centrilobular emphysema: Secondary | ICD-10-CM

## 2020-05-17 ENCOUNTER — Encounter (HOSPITAL_BASED_OUTPATIENT_CLINIC_OR_DEPARTMENT_OTHER): Payer: Medicaid Other | Admitting: Physician Assistant

## 2020-07-02 ENCOUNTER — Other Ambulatory Visit: Payer: Self-pay | Admitting: Emergency Medicine

## 2020-07-07 ENCOUNTER — Other Ambulatory Visit: Payer: Self-pay

## 2020-07-07 ENCOUNTER — Inpatient Hospital Stay (HOSPITAL_COMMUNITY)
Admission: EM | Admit: 2020-07-07 | Discharge: 2020-07-12 | DRG: 190 | Disposition: A | Discharge status: Home / Self Care | Payer: Medicaid Other | Attending: Internal Medicine | Admitting: Internal Medicine

## 2020-07-07 ENCOUNTER — Encounter (HOSPITAL_COMMUNITY): Payer: Self-pay

## 2020-07-07 ENCOUNTER — Emergency Department (HOSPITAL_COMMUNITY): Payer: Medicaid Other

## 2020-07-07 DIAGNOSIS — I160 Hypertensive urgency: Secondary | ICD-10-CM

## 2020-07-07 DIAGNOSIS — Z85118 Personal history of other malignant neoplasm of bronchus and lung: Secondary | ICD-10-CM

## 2020-07-07 DIAGNOSIS — I10 Essential (primary) hypertension: Secondary | ICD-10-CM | POA: Diagnosis present

## 2020-07-07 DIAGNOSIS — L89892 Pressure ulcer of other site, stage 2: Secondary | ICD-10-CM | POA: Diagnosis present

## 2020-07-07 DIAGNOSIS — Z20822 Contact with and (suspected) exposure to covid-19: Secondary | ICD-10-CM | POA: Diagnosis present

## 2020-07-07 DIAGNOSIS — I480 Paroxysmal atrial fibrillation: Secondary | ICD-10-CM | POA: Diagnosis present

## 2020-07-07 DIAGNOSIS — E1140 Type 2 diabetes mellitus with diabetic neuropathy, unspecified: Secondary | ICD-10-CM | POA: Diagnosis present

## 2020-07-07 DIAGNOSIS — Z8521 Personal history of malignant neoplasm of larynx: Secondary | ICD-10-CM | POA: Diagnosis not present

## 2020-07-07 DIAGNOSIS — E1165 Type 2 diabetes mellitus with hyperglycemia: Secondary | ICD-10-CM | POA: Diagnosis present

## 2020-07-07 DIAGNOSIS — G894 Chronic pain syndrome: Secondary | ICD-10-CM | POA: Diagnosis present

## 2020-07-07 DIAGNOSIS — L8990 Pressure ulcer of unspecified site, unspecified stage: Secondary | ICD-10-CM | POA: Insufficient documentation

## 2020-07-07 DIAGNOSIS — Z794 Long term (current) use of insulin: Secondary | ICD-10-CM | POA: Diagnosis not present

## 2020-07-07 DIAGNOSIS — I4891 Unspecified atrial fibrillation: Secondary | ICD-10-CM | POA: Diagnosis not present

## 2020-07-07 DIAGNOSIS — Z930 Tracheostomy status: Secondary | ICD-10-CM | POA: Diagnosis not present

## 2020-07-07 DIAGNOSIS — E119 Type 2 diabetes mellitus without complications: Secondary | ICD-10-CM

## 2020-07-07 DIAGNOSIS — J441 Chronic obstructive pulmonary disease with (acute) exacerbation: Principal | ICD-10-CM | POA: Diagnosis present

## 2020-07-07 DIAGNOSIS — Z7951 Long term (current) use of inhaled steroids: Secondary | ICD-10-CM | POA: Diagnosis not present

## 2020-07-07 DIAGNOSIS — Z89512 Acquired absence of left leg below knee: Secondary | ICD-10-CM

## 2020-07-07 DIAGNOSIS — E785 Hyperlipidemia, unspecified: Secondary | ICD-10-CM | POA: Diagnosis present

## 2020-07-07 DIAGNOSIS — M797 Fibromyalgia: Secondary | ICD-10-CM | POA: Diagnosis present

## 2020-07-07 DIAGNOSIS — Z87891 Personal history of nicotine dependence: Secondary | ICD-10-CM | POA: Diagnosis not present

## 2020-07-07 DIAGNOSIS — J449 Chronic obstructive pulmonary disease, unspecified: Secondary | ICD-10-CM | POA: Diagnosis present

## 2020-07-07 DIAGNOSIS — L97519 Non-pressure chronic ulcer of other part of right foot with unspecified severity: Secondary | ICD-10-CM | POA: Diagnosis present

## 2020-07-07 DIAGNOSIS — J9601 Acute respiratory failure with hypoxia: Secondary | ICD-10-CM

## 2020-07-07 DIAGNOSIS — E11621 Type 2 diabetes mellitus with foot ulcer: Secondary | ICD-10-CM | POA: Diagnosis present

## 2020-07-07 DIAGNOSIS — Z85819 Personal history of malignant neoplasm of unspecified site of lip, oral cavity, and pharynx: Secondary | ICD-10-CM | POA: Diagnosis not present

## 2020-07-07 DIAGNOSIS — Z9981 Dependence on supplemental oxygen: Secondary | ICD-10-CM

## 2020-07-07 LAB — BASIC METABOLIC PANEL
Anion gap: 7 (ref 5–15)
BUN: 16 mg/dL (ref 8–23)
CO2: 30 mmol/L (ref 22–32)
Calcium: 8.8 mg/dL — ABNORMAL LOW (ref 8.9–10.3)
Chloride: 104 mmol/L (ref 98–111)
Creatinine, Ser: 1.11 mg/dL — ABNORMAL HIGH (ref 0.44–1.00)
GFR, Estimated: 56 mL/min — ABNORMAL LOW (ref >60)
Glucose, Bld: 206 mg/dL — ABNORMAL HIGH (ref 70–99)
Potassium: 3.9 mmol/L (ref 3.5–5.1)
Sodium: 141 mmol/L (ref 135–145)

## 2020-07-07 LAB — CBC WITH DIFFERENTIAL/PLATELET
Abs Immature Granulocytes: 0.04 10*3/uL (ref 0.00–0.07)
Basophils Absolute: 0.1 10*3/uL (ref 0.0–0.1)
Basophils Relative: 1 %
Eosinophils Absolute: 0.2 10*3/uL (ref 0.0–0.5)
Eosinophils Relative: 3 %
HCT: 44.6 % (ref 36.0–46.0)
Hemoglobin: 14.6 g/dL (ref 12.0–15.0)
Immature Granulocytes: 1 %
Lymphocytes Relative: 21 %
Lymphs Abs: 1.5 10*3/uL (ref 0.7–4.0)
MCH: 28.7 pg (ref 26.0–34.0)
MCHC: 32.7 g/dL (ref 30.0–36.0)
MCV: 87.6 fL (ref 80.0–100.0)
Monocytes Absolute: 0.3 10*3/uL (ref 0.1–1.0)
Monocytes Relative: 4 %
Neutro Abs: 5.1 10*3/uL (ref 1.7–7.7)
Neutrophils Relative %: 70 %
Platelets: 215 10*3/uL (ref 150–400)
RBC: 5.09 MIL/uL (ref 3.87–5.11)
RDW: 12.5 % (ref 11.5–15.5)
WBC: 7.1 10*3/uL (ref 4.0–10.5)
nRBC: 0 % (ref 0.0–0.2)

## 2020-07-07 LAB — RESP PANEL BY RT-PCR (FLU A&B, COVID) ARPGX2
Influenza A by PCR: NEGATIVE
Influenza B by PCR: NEGATIVE
SARS Coronavirus 2 by RT PCR: NEGATIVE

## 2020-07-07 LAB — BRAIN NATRIURETIC PEPTIDE: B Natriuretic Peptide: 79.3 pg/mL (ref 0.0–100.0)

## 2020-07-07 MED ORDER — METHYLPREDNISOLONE SODIUM SUCC 125 MG IJ SOLR
80.0000 mg | Freq: Once | INTRAMUSCULAR | Status: AC
  Administered 2020-07-07: 80 mg via INTRAVENOUS
  Filled 2020-07-07: qty 2

## 2020-07-07 MED ORDER — IPRATROPIUM-ALBUTEROL 0.5-2.5 (3) MG/3ML IN SOLN
3.0000 mL | Freq: Once | RESPIRATORY_TRACT | Status: AC
  Administered 2020-07-07: 3 mL via RESPIRATORY_TRACT
  Filled 2020-07-07: qty 3

## 2020-07-07 NOTE — ED Triage Notes (Signed)
Patient was at her doctors office for a diabetic ulcer on her lip. Patient became short of breath after walking to the restroom. The doctors office staff became concerned took her blood pressure and noted hypertension and then called 911.

## 2020-07-07 NOTE — ED Provider Notes (Signed)
[facility] Provider Note   CSN: [id number] Arrival date & time: 07/07/20  1612     History Chief Complaint  Patient presents with  . Shortness of Breath    Kaedance F Ground is a 65 y.o. female past medical history of throat cancer trach dependent, insulin-dependent type 2 diabetes, diabetic neuropathy, COPD, osteomyelitis status post left BKA presenting to the ED for evaluation of shortness of breath.  States she had a cold a few months back.  Since then she has been having gradually worsening shortness of breath from baseline.  She states she feels wheezy and is using her inhalers for her symptoms.  She feels more short of breath than usual.  Feels short of breath with laying flat which is also new for her.  States her sputum is thicker than usual from her tracheostomy.  Denies any new swelling in her extremities.    Was being seen today for a place on her left chin that is concerning for skin cancer, as well as an ulcer on her right foot.  States for she has been treating the ulcer on her right foot with alginate and bandages and it has been improving.  She states she does not think it looks very bad, it has been improving with her wound care.  Denies any new redness or swelling particularly to the right leg.  No fevers.  She is not ambulatory at baseline, uses a wheelchair for ambulation.  The history is provided by the patient.       Past Medical History:  Diagnosis Date  . Anxiety   . Arthritis   . COPD (chronic obstructive pulmonary disease) ([place])   . Depression   . Diabetes mellitus without complication ([place])    INSULIN DEPENDENT  . Diabetic neuropathy ([place])   . Difficult intubation    total laryngectomy  . Fibromyalgia   . Head and neck cancer ([place]) 04/19/2015  . Hypertension   . Kidney stone   . Morbid obesity with BMI of 40.0-44.9, adult ([place])   . Neuropathy   . Nocturia   . Pneumonia   . Rash, skin    face  . Renal  insufficiency   . Shortness of breath   . Sleep apnea    has not used cpap past 10 yrs  . Urge incontinence     Patient Active Problem List   Diagnosis Date Noted  . COPD exacerbation ([place]) 07/07/2020  . Fever and chills   . Hypoxia   . Abnormal CT of the chest 11/28/2017  . Non-small cell cancer of left lung ([place]) 10/17/2017  . 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 Post-operative pain   . Constipation due to pain medication   . Neuropathic pain   . Muscle spasm   . SIRS (systemic inflammatory response syndrome) ([place]) 09/18/2015  . Labile blood pressure   . DM type 2 with diabetic peripheral neuropathy ([place])   . Abnormality of gait   . Unilateral complete BKA ([place]) 09/15/2015  . Status post below knee amputation of left lower extremity   . Acute bronchitis   . Primary osteoarthritis of right knee   . Infection   . Sepsis ([place]) 09/09/2015  . Wound infection 09/09/2015  . 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 Type 2 diabetes mellitus with circulatory disorder ([place])   . Osteomyelitis of foot, acute, left 08/25/2015  . Diabetic foot infection ([place]) 04/19/2015  . Diabetic foot ulcer (  [place]) 04/19/2015  . CKD (chronic kidney disease) 04/19/2015  . Head and neck cancer ([place]) 04/19/2015  . Right hip pain 04/19/2015  . Infection of supraglottis (throat) 09/16/2014  . Incidental lung nodule, less than or equal to 47m 09/16/2014  . Obstructive uropathy 03/22/2014  . COPD (chronic obstructive pulmonary disease) ([place] 03/20/2014  . Controlled type 2 diabetes mellitus with diabetic nephropathy ([place] 03/20/2014  . Lice infested hair 032/54/9826 . Former smoker 08/05/2012  . TINEA CORPORIS 09/01/2009  . DEPRESSION 08/11/2009  . Chronic pain syndrome 04/15/2007  . HYPERLIPIDEMIA NEC/NOS 11/02/2006  . Peripheral vascular disease ([place] 11/02/2006  . MORBID OBESITY 10/28/2006  . Obstructive sleep apnea 10/28/2006  . POLYNEUROPATHY 10/28/2006  . Benign  essential HTN 10/28/2006  . OSTEOARTHRITIS, KNEES, BILATERAL 10/28/2006  . PROTEINURIA 11/25/2005    Past Surgical History:  Procedure Laterality Date  . AMPUTATION Left 08/25/2015   Procedure: 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;  Surgeon: MNewt Minion MD;  [facility]  Service: Orthopedics;  Laterality: Left;  . AMPUTATION Left 09/13/2015   Procedure: AMPUTATION BELOW KNEE;  Surgeon: MMarybelle Killings MD;  [facility]  Service: Orthopedics;  Laterality: Left;  . BRAIN SURGERY     vertebral aneurysm  . BREAST LUMPECTOMY  1982   benign  . CARPEL TUNNEL    . CHOLECYSTECTOMY    . CYSTOSCOPY W/ URETERAL STENT PLACEMENT Left 03/22/2014   Procedure: CYSTOSCOPY WITH RETROGRADE PYELOGRAM/URETERAL STENT PLACEMENT;  Surgeon: TAlexis Frock MD;  [facility]  Service: Urology;  Laterality: Left;  . CYSTOSCOPY WITH RETROGRADE PYELOGRAM, URETEROSCOPY AND STENT PLACEMENT Left 05/11/2014   Procedure: CYSTOSCOPY WITH RETROGRADE PYELOGRAM, URETEROSCOPY AND STENT PLACEMENT;  Surgeon: TAlexis Frock MD;  Location[facility];  Service: Urology;  Laterality: Left;  . HOLMIUM LASER APPLICATION Left 34/15/8309  Procedure: HOLMIUM LASER APPLICATION;  Surgeon: TAlexis Frock MD;  [facility];  Service: Urology;  Laterality: Left;  . LARYNGECTOMY     total ( stoma)  . LEG AMPUTATION BELOW KNEE Left 09/13/2015  . SALIVARY STONE REMOVAL    . TONSILLECTOMY    . TUBAL LIGATION       OB History   No obstetric history on file.     Family History  Adopted: Yes    Social History   Tobacco Use  . Smoking status: Former Smoker    Packs/day: 1.00    Years: 40.00    Pack years: 40.00    Types: Cigarettes    Quit date: 05/22/2015    Years since quitting: 5.1  . Smokeless tobacco: Never Used  Substance Use Topics  . Alcohol use: No  . Drug use: No    Home Medications Prior to Admission medications   Medication Sig Start Date End Date Taking? Authorizing Provider  albuterol (VENTOLIN HFA) 108  (90 Base) MCG/ACT inhaler Inhale 2 puffs into the lungs every 4 (four) hours as needed for wheezing or shortness of breath. 06/01/19   BCollene Gobble MD  amLODipine (NORVASC) 10 MG tablet take 1 tablet (10 mg) by oral route once daily Patient taking differently: Take 10 mg by mouth daily.  04/30/16   DNewt Minion MD  azithromycin (ZITHROMAX) 250 MG tablet Take 1 tablet (250 mg total) by mouth daily. Two pill on day one, then one pill a day until gone 06/23/19   Byrum, RRose Fillers MD  blood glucose meter kit and supplies KIT Dispense based on patient and insurance preference. Use up to  four times daily as directed. (FOR ICD-9 250.00, 250.01). 03/24/14   Bonnielee Haff, MD  Buprenorphine HCl-Naloxone HCl (SUBOXONE) 8-2 MG FILM Place 1 Film under the tongue 3 (three) times daily.     [provider]  cephALEXin (KEFLEX) 500 MG capsule Take 1 capsule (500 mg total) by mouth every 8 (eight) hours. 06/18/18   Jean Rosenthal, MD  hydrochlorothiazide (HYDRODIURIL) 25 MG tablet Take 25 mg by mouth daily.    [provider]  ibuprofen (ADVIL,MOTRIN) 200 MG tablet Take 400-600 mg by mouth every 6 (six) hours as needed for fever, headache or moderate pain.     [provider]  insulin aspart (NOVOLOG FLEXPEN) 100 UNIT/ML FlexPen Inject 3 Units into the skin 3 (three) times daily with meals.    [provider]  Insulin Glargine (LANTUS SOLOSTAR) 100 UNIT/ML Solostar Pen Inject 17 Units into the skin daily. At 10AM 09/26/15   LoveIvan Anchors, PA-C  Insulin Pen Needle (PEN NEEDLES 3/16") 31G X 5 MM MISC Use as directed. 03/24/14   Bonnielee Haff, MD  Multiple Vitamin (MULTIVITAMIN) tablet Take 1 tablet by mouth daily with supper.     [provider]  pregabalin (LYRICA) 50 MG capsule Take 1 capsule (50 mg total) by mouth 3 (three) times daily. Patient taking differently: Take 75 mg by mouth 2 (two) times daily.  09/26/15   Love, Ivan Anchors, PA-C  PROAIR HFA 108 (90 Base) MCG/ACT  inhaler TAKE 2 PUFFS BY MOUTH EVERY 6 HOURS AS NEEDED FOR WHEEZE OR SHORTNESS OF BREATH 07/03/20   Collene Gobble, MD  STIOLTO RESPIMAT 2.5-2.5 MCG/ACT AERS INHALE 2 PUFFS BY MOUTH INTO THE LUNGS DAILY 05/11/20   Collene Gobble, MD    Allergies    Ace inhibitors and Zestril [lisinopril]  Review of Systems   Review of Systems  Respiratory: Positive for shortness of breath.   Skin: Positive for wound.  All other systems reviewed and are negative.   Physical Exam Updated Vital Signs BP (!) 116/52   Pulse 90   Temp 98.9 F (37.2 C) (Oral)   Resp (!) 21   Ht 5' 6"  (1.676 m)   Wt 102 kg   SpO2 97%   BMI 36.29 kg/m   Physical Exam Vitals and nursing note reviewed.  Constitutional:      General: She is not in acute distress.    Appearance: She is well-developed.  HENT:     Head: Normocephalic and atraumatic.  Eyes:     Conjunctiva/sclera: Conjunctivae normal.  Cardiovascular:     Rate and Rhythm: Normal rate and regular rhythm.  Pulmonary:     Comments: Tracheostomy dependent.  Winded with speaking.  D satting into the high 80s with movement about the bed. Inspiratory and expiratory wheezes throughout Abdominal:     Palpations: Abdomen is soft.  Skin:    General: Skin is warm.     Comments: 2.5 cm round ulceration to the right medial foot.  Some slough pink tissue at the base of the wound.  No significant surrounding signs of cellulitis.  No drainage. Stasis skin changes to the right lower leg.  (Patient states this is unchanged from chronic appearance)  Neurological:     Mental Status: She is alert.  Psychiatric:        Behavior: Behavior normal.     ED Results / Procedures / Treatments   Labs (all labs ordered are listed, but only abnormal results are displayed) Labs Reviewed  BASIC METABOLIC PANEL - Abnormal; Notable for the following components:      Result Value   Glucose, Bld 206 (*)    Creatinine, Ser 1.11 (*)    Calcium 8.8 (*)    GFR, Estimated 56 (*)     All other components within normal limits  RESP PANEL BY RT-PCR (FLU A&B, COVID) ARPGX2  BRAIN NATRIURETIC PEPTIDE  CBC WITH DIFFERENTIAL/PLATELET    EKG EKG Interpretation  Date/Time:  Friday Jul 07 2020 16:56:37 EDT Ventricular Rate:  86 PR Interval:  203 QRS Duration: 115 QT Interval:  382 QTC Calculation: 457 R Axis:   -47 Text Interpretation: Sinus rhythm Incomplete RBBB and LAFB Anterior infarct, old Baseline wander in lead(s) II III aVL aVF V1 V2 V3 Confirmed by Thamas Jaegers ([id number]) on 07/07/2020 9:12:45 PM   Radiology DG Chest 2 View  Result Date: 07/07/2020 CLINICAL DATA:  Shortness of breath. EXAM: CHEST - 2 VIEW COMPARISON:  Radiograph 06/16/2018 FINDINGS: Borderline cardiomegaly. Streaky left perihilar opacity favors atelectasis. No confluent consolidation. No pulmonary edema, pleural effusion, or pneumothorax. Bones are under mineralized. IMPRESSION: Borderline cardiomegaly. Streaky left perihilar opacity favors atelectasis. Electronically Signed   By: Keith Rake M.D.   On: 07/07/2020 18:36    Procedures Procedures   Medications Ordered in ED Medications  ipratropium-albuterol (DUONEB) 0.5-2.5 (3) MG/3ML nebulizer solution 3 mL (3 mLs Nebulization Given 07/07/20 1949)  methylPREDNISolone sodium succinate (SOLU-MEDROL) 125 mg/2 mL injection 80 mg (80 mg Intravenous Given 07/07/20 1946)    ED Course  I have reviewed the triage vital signs and the nursing notes.  Pertinent labs & imaging results that were available during my care of the patient were reviewed by me and considered in my medical decision making (see chart for details).    MDM Rules/Calculators/A&P                          Patient is 65 year old female with history of throat cancer, tracheostomy dependent, also with COPD, presenting for worsening shortness of breath.  States she had a cold a few months ago and has been feeling short of breath since.  Short of breath is progressively worsening.   She has not quite dyspneic on any exertion, with some orthopnea as well.  No fevers, endorses thick secretions via her tracheostomy.  Per chart review she is followed by pulmonology, Dr. Lamonte Sakai.  On exam she is winded with speaking, intermittently hypoxic with movement about the bed to 88% on room air.  She is improved on 6 L nasal cannula though still intermittently hypoxic with movement.  She wheezes bilaterally.  No neb ordered for treatment, Solu-Medrol.  No leukocytosis or fever.  No infiltrate on chest x-ray.  However given new oxygen requirement and significant symptoms at home, patient requires admission for further management of what seems to be COPD exacerbation.  Final Clinical Impression(s) / ED Diagnoses Final diagnoses:  COPD exacerbation [facility])    Rx / DC Orders ED Discharge Orders    None       [employer] [employer], [place] N, PA-C 07/07/20 2142    Luna Fuse, MD 07/07/20 2146

## 2020-07-07 NOTE — ED Notes (Signed)
Attempted report x1. 

## 2020-07-07 NOTE — ED Notes (Signed)
Called respiratory for trach collar O2 for pt. Pt finished neb tx.

## 2020-07-08 DIAGNOSIS — J441 Chronic obstructive pulmonary disease with (acute) exacerbation: Principal | ICD-10-CM

## 2020-07-08 DIAGNOSIS — I160 Hypertensive urgency: Secondary | ICD-10-CM

## 2020-07-08 DIAGNOSIS — J9601 Acute respiratory failure with hypoxia: Secondary | ICD-10-CM

## 2020-07-08 DIAGNOSIS — E119 Type 2 diabetes mellitus without complications: Secondary | ICD-10-CM

## 2020-07-08 DIAGNOSIS — L8990 Pressure ulcer of unspecified site, unspecified stage: Secondary | ICD-10-CM | POA: Insufficient documentation

## 2020-07-08 DIAGNOSIS — E785 Hyperlipidemia, unspecified: Secondary | ICD-10-CM

## 2020-07-08 LAB — GLUCOSE, CAPILLARY
Glucose-Capillary: 324 mg/dL — ABNORMAL HIGH (ref 70–99)
Glucose-Capillary: 313 mg/dL — ABNORMAL HIGH (ref 70–99)
Glucose-Capillary: 317 mg/dL — ABNORMAL HIGH (ref 70–99)
Glucose-Capillary: 398 mg/dL — ABNORMAL HIGH (ref 70–99)
Glucose-Capillary: 278 mg/dL — ABNORMAL HIGH (ref 70–99)

## 2020-07-08 LAB — HEMOGLOBIN A1C
Hgb A1c MFr Bld: 8.3 % — ABNORMAL HIGH (ref 4.8–5.6)
Mean Plasma Glucose: 191.51 mg/dL

## 2020-07-08 LAB — HIV ANTIBODY (ROUTINE TESTING W REFLEX): HIV Screen 4th Generation wRfx: NONREACTIVE

## 2020-07-08 MED ORDER — METHYLPREDNISOLONE SODIUM SUCC 125 MG IJ SOLR
80.0000 mg | Freq: Three times a day (TID) | INTRAMUSCULAR | Status: DC
  Administered 2020-07-08 (×2): 80 mg via INTRAVENOUS
  Filled 2020-07-08 (×2): qty 2

## 2020-07-08 MED ORDER — INSULIN ASPART 100 UNIT/ML IJ SOLN
0.0000 [IU] | Freq: Every day | INTRAMUSCULAR | Status: DC
  Administered 2020-07-08: 4 [IU] via SUBCUTANEOUS

## 2020-07-08 MED ORDER — INSULIN ASPART 100 UNIT/ML IJ SOLN
0.0000 [IU] | Freq: Three times a day (TID) | INTRAMUSCULAR | Status: DC
  Administered 2020-07-08 (×2): 11 [IU] via SUBCUTANEOUS

## 2020-07-08 MED ORDER — IPRATROPIUM-ALBUTEROL 0.5-2.5 (3) MG/3ML IN SOLN
3.0000 mL | Freq: Four times a day (QID) | RESPIRATORY_TRACT | Status: DC
  Administered 2020-07-08 (×4): 3 mL via RESPIRATORY_TRACT
  Filled 2020-07-08 (×4): qty 3

## 2020-07-08 MED ORDER — ALBUTEROL SULFATE (2.5 MG/3ML) 0.083% IN NEBU
2.5000 mg | INHALATION_SOLUTION | RESPIRATORY_TRACT | Status: DC | PRN
Start: 2020-07-08 — End: 2020-07-12

## 2020-07-08 MED ORDER — METHYLPREDNISOLONE SODIUM SUCC 125 MG IJ SOLR
60.0000 mg | Freq: Two times a day (BID) | INTRAMUSCULAR | Status: DC
  Administered 2020-07-09: 60 mg via INTRAVENOUS
  Filled 2020-07-08: qty 2

## 2020-07-08 MED ORDER — PREGABALIN 75 MG PO CAPS
75.0000 mg | ORAL_CAPSULE | Freq: Two times a day (BID) | ORAL | Status: DC
  Administered 2020-07-08 – 2020-07-11 (×6): 75 mg via ORAL
  Filled 2020-07-08 (×10): qty 1

## 2020-07-08 MED ORDER — ENOXAPARIN SODIUM 60 MG/0.6ML IJ SOSY
60.0000 mg | PREFILLED_SYRINGE | INTRAMUSCULAR | Status: DC
  Administered 2020-07-08 – 2020-07-09 (×2): 60 mg via SUBCUTANEOUS
  Filled 2020-07-08 (×2): qty 0.6

## 2020-07-08 MED ORDER — BUPRENORPHINE HCL-NALOXONE HCL 8-2 MG SL SUBL
1.0000 | SUBLINGUAL_TABLET | Freq: Three times a day (TID) | SUBLINGUAL | Status: DC
Start: 2020-07-08 — End: 2020-07-12
  Administered 2020-07-08 – 2020-07-12 (×14): 1 via SUBLINGUAL
  Filled 2020-07-08 (×14): qty 1

## 2020-07-08 MED ORDER — INSULIN ASPART 100 UNIT/ML IJ SOLN
0.0000 [IU] | Freq: Three times a day (TID) | INTRAMUSCULAR | Status: DC
  Administered 2020-07-08 – 2020-07-09 (×2): 20 [IU] via SUBCUTANEOUS
  Administered 2020-07-09: 4 [IU] via SUBCUTANEOUS
  Administered 2020-07-09: 15 [IU] via SUBCUTANEOUS
  Administered 2020-07-10 (×2): 7 [IU] via SUBCUTANEOUS
  Administered 2020-07-10: 4 [IU] via SUBCUTANEOUS
  Administered 2020-07-11 (×3): 15 [IU] via SUBCUTANEOUS
  Administered 2020-07-12: 11 [IU] via SUBCUTANEOUS
  Administered 2020-07-12: 7 [IU] via SUBCUTANEOUS
  Administered 2020-07-12: 11 [IU] via SUBCUTANEOUS

## 2020-07-08 MED ORDER — ATORVASTATIN CALCIUM 10 MG PO TABS
20.0000 mg | ORAL_TABLET | Freq: Every day | ORAL | Status: DC
  Administered 2020-07-08 – 2020-07-12 (×5): 20 mg via ORAL
  Filled 2020-07-08 (×5): qty 2

## 2020-07-08 MED ORDER — AMLODIPINE BESYLATE 10 MG PO TABS
10.0000 mg | ORAL_TABLET | Freq: Every day | ORAL | Status: DC
  Administered 2020-07-08 – 2020-07-10 (×3): 10 mg via ORAL
  Filled 2020-07-08 (×4): qty 1

## 2020-07-08 MED ORDER — ENSURE MAX PROTEIN PO LIQD
11.0000 [oz_av] | Freq: Two times a day (BID) | ORAL | Status: DC
  Administered 2020-07-08 – 2020-07-12 (×9): 11 [oz_av] via ORAL
  Filled 2020-07-08 (×10): qty 330

## 2020-07-08 MED ORDER — BUDESONIDE 0.25 MG/2ML IN SUSP
0.2500 mg | Freq: Two times a day (BID) | RESPIRATORY_TRACT | Status: DC
  Administered 2020-07-08 – 2020-07-12 (×9): 0.25 mg via RESPIRATORY_TRACT
  Filled 2020-07-08 (×9): qty 2

## 2020-07-08 MED ORDER — HYDROCHLOROTHIAZIDE 25 MG PO TABS
25.0000 mg | ORAL_TABLET | Freq: Every day | ORAL | Status: DC
  Administered 2020-07-08 – 2020-07-12 (×5): 25 mg via ORAL
  Filled 2020-07-08 (×6): qty 1

## 2020-07-08 MED ORDER — INSULIN GLARGINE 100 UNIT/ML ~~LOC~~ SOLN
30.0000 [IU] | Freq: Every day | SUBCUTANEOUS | Status: DC
  Administered 2020-07-09: 30 [IU] via SUBCUTANEOUS
  Filled 2020-07-08: qty 0.3

## 2020-07-08 MED ORDER — INSULIN ASPART 100 UNIT/ML IJ SOLN
0.0000 [IU] | Freq: Every day | INTRAMUSCULAR | Status: DC
  Administered 2020-07-08 – 2020-07-09 (×2): 3 [IU] via SUBCUTANEOUS
  Administered 2020-07-10: 4 [IU] via SUBCUTANEOUS
  Administered 2020-07-11: 5 [IU] via SUBCUTANEOUS

## 2020-07-08 MED ORDER — CYCLOBENZAPRINE HCL 10 MG PO TABS
10.0000 mg | ORAL_TABLET | Freq: Two times a day (BID) | ORAL | Status: DC
  Administered 2020-07-08 – 2020-07-11 (×5): 10 mg via ORAL
  Filled 2020-07-08 (×9): qty 1

## 2020-07-08 MED ORDER — SODIUM CHLORIDE 0.9 % IV SOLN
1.0000 g | Freq: Once | INTRAVENOUS | Status: AC
  Administered 2020-07-08: 1 g via INTRAVENOUS
  Filled 2020-07-08: qty 1

## 2020-07-08 MED ORDER — INSULIN ASPART 100 UNIT/ML IJ SOLN
3.0000 [IU] | Freq: Three times a day (TID) | INTRAMUSCULAR | Status: DC
  Administered 2020-07-08 – 2020-07-09 (×2): 3 [IU] via SUBCUTANEOUS

## 2020-07-08 MED ORDER — FUROSEMIDE 10 MG/ML IJ SOLN
20.0000 mg | Freq: Once | INTRAMUSCULAR | Status: AC
  Administered 2020-07-08: 20 mg via INTRAVENOUS
  Filled 2020-07-08: qty 2

## 2020-07-08 MED ORDER — HYDRALAZINE HCL 20 MG/ML IJ SOLN: 5.0000 mg | INTRAMUSCULAR | Status: DC | PRN

## 2020-07-08 MED ORDER — SODIUM CHLORIDE 0.9 % IV SOLN
1.0000 g | INTRAVENOUS | Status: AC
  Administered 2020-07-09 – 2020-07-11 (×4): 1 g via INTRAVENOUS
  Filled 2020-07-08 (×4): qty 1

## 2020-07-08 MED ORDER — INSULIN GLARGINE 100 UNIT/ML ~~LOC~~ SOLN
20.0000 [IU] | Freq: Every day | SUBCUTANEOUS | Status: DC
  Administered 2020-07-08: 20 [IU] via SUBCUTANEOUS
  Filled 2020-07-08: qty 0.2

## 2020-07-08 MED ORDER — IPRATROPIUM-ALBUTEROL 0.5-2.5 (3) MG/3ML IN SOLN
3.0000 mL | Freq: Two times a day (BID) | RESPIRATORY_TRACT | Status: DC
  Administered 2020-07-09 – 2020-07-12 (×7): 3 mL via RESPIRATORY_TRACT
  Filled 2020-07-08 (×7): qty 3

## 2020-07-08 NOTE — Progress Notes (Addendum)
Physical Therapy Evaluation Patient Details Name: [name] MRN: [id number] DOB: 1955/03/30 Today's Date: 07/08/2020   History of Present Illness  [name] is a 65 y.o. female with medical history significant of laryngeal cancer status post laryngectomy, trach dependent, non-small cell lung cancer status post SBRT, COPD, insulin-dependent type 2 diabetes, diabetic neuropathy, left BKA, fibromyalgia/chronic pain, hypertension, hyperlipidemia presented to the ED for evaluation of shortness of breath. Admitted for Acute hypoxemic respiratory failure secondary to acute COPD exacerbation.  Clinical Impression  Pt admitted with above diagnosis. Pt reports she feels back to baseline with her ability to mobilize in bed and transfer. She is non-ambulatory and typically only needs assist to get into the tub at home, requesting a tub bench for greater ease with ADLs. She needs new wheelchair breaks - currently requires w/c block to allow for safe transfer from bed to w/c. Able to mobilize in w/c independently. Pt currently with functional limitations due to the deficits listed below (see PT Problem List). Pt will benefit from skilled PT to increase their independence and safety with mobility to allow discharge to the venue listed below.       Follow Up Recommendations No PT follow up    Equipment Recommendations  Other (comment) (Tub Bench and Wheelchair 20" width)    Recommendations for Other Services       Precautions / Restrictions Precautions Precautions: Fall Precaution Comments: hx Lt BKA Restrictions Weight Bearing Restrictions: No      Mobility  Bed Mobility Overal bed mobility: Modified Independent             General bed mobility comments: extra time    Transfers Overall transfer level: Needs assistance Equipment used: None Transfers: Lateral/Scoot Transfers;Squat Pivot Transfers     Squat pivot transfers: Min guard    Lateral/Scoot Transfers: Min guard General  transfer comment: Pt performed scoot/squat transfer towards Rt side. Required w/c block due to loose breaks. States she will have DME provider fix. Min guard for safety. Pt states baseline with transfer.  Ambulation/Gait             General Gait Details: Non ambulatory  Hotel manager mobility: Yes Wheelchair propulsion: Both upper extremities;Right lower extremity Wheelchair parts: Independent Distance: Managed in room, limited by equipment and space. Wheelchair Assistance Details (indicate cue type and reason): Ind turning, forward and backwards.  Modified Rankin (Stroke Patients Only)       Balance Overall balance assessment: Mild deficits observed, not formally tested (Sits EOB independently, pt states she does not stand.)                                           Pertinent Vitals/Pain Pain Assessment: 0-10 Pain Score: 10-Worst pain ever Pain Location: all over Pain Descriptors / Indicators: Aching Pain Intervention(s): Monitored during session;Patient requesting pain meds-RN notified;Other (comment) (RN notified)    Home Living Family/patient expects to be discharged to:: Private residence Living Arrangements: Spouse/significant other;Children Available Help at Discharge: Family;Available 24 hours/day Type of Home: House Home Access: Ramped entrance     Home Layout: One level Home Equipment: Wheelchair - manual;Bedside commode (sliding board) Additional Comments: She does not stand, uses sliding board.    Prior Function Level of Independence: Needs assistance   Gait / Transfers Assistance Needed: non  ambulatory  ADL's / Homemaking Assistance Needed: Family assists getting into tub, states she can get on/off toielt and dress herself  Comments: Expresses need for tub bench     Hand Dominance   Dominant Hand: Right    Extremity/Trunk Assessment   Upper Extremity  Assessment Upper Extremity Assessment: Defer to OT evaluation    Lower Extremity Assessment Lower Extremity Assessment: LLE deficits/detail;Generalized weakness LLE Deficits / Details: BKA       Communication   Communication: Tracheostomy (Voice box)  Cognition Arousal/Alertness: Awake/alert Behavior During Therapy: WFL for tasks assessed/performed Overall Cognitive Status: Within Functional Limits for tasks assessed                                        General Comments General comments (skin integrity, edema, etc.): Dressing on Rt medial foot. SpO2 94% on 5L supplemental O2 over trach.    Exercises     Assessment/Plan    PT Assessment Patient needs continued PT services  PT Problem List Decreased strength;Decreased activity tolerance;Decreased balance;Decreased mobility;Obesity;Pain       PT Treatment Interventions DME instruction;Functional mobility training;Therapeutic activities;Therapeutic exercise    PT Goals (Current goals can be found in the Care Plan section)  Acute Rehab PT Goals Patient Stated Goal: Go home PT Goal Formulation: With patient Time For Goal Achievement: 07/22/20 Potential to Achieve Goals: Good    Frequency Min 3X/week   Barriers to discharge        Co-evaluation               AM-PAC PT "6 Clicks" Mobility  Outcome Measure Help needed turning from your back to your side while in a flat bed without using bedrails?: None Help needed moving from lying on your back to sitting on the side of a flat bed without using bedrails?: None Help needed moving to and from a bed to a chair (including a wheelchair)?: A Little Help needed standing up from a chair using your arms (e.g., wheelchair or bedside chair)?: Total Help needed to walk in hospital room?: Total Help needed climbing 3-5 steps with a railing? : Total 6 Click Score: 14    End of Session Equipment Utilized During Treatment: Gait belt;Oxygen Activity  Tolerance: Patient tolerated treatment well Patient left: in chair;with call bell/phone within reach;with nursing/sitter in room (In her wheelchair speaking with RN.) Nurse Communication: Mobility status PT Visit Diagnosis: Muscle weakness (generalized) (M62.81);Difficulty in walking, not elsewhere classified (R26.2);Pain Pain - Right/Left: Right Pain - part of body: Leg (and all over)    Time: 4163-8453 PT Time Calculation (min) (ACUTE ONLY): 31 min   Charges:   PT Evaluation $PT Eval Low Complexity: 1 Low PT Treatments $Therapeutic Activity: 8-22 mins        Elayne Snare, PT, DPT  Ellouise Newer 07/08/2020, 9:44 AM

## 2020-07-08 NOTE — Progress Notes (Signed)
Initial Nutrition Assessment  DOCUMENTATION CODES:   Morbid obesity  INTERVENTION:  Provide Ensure Max po BID, each supplement provides 150 kcal and 30 grams of protein.   Encourage adequate PO intake.   NUTRITION DIAGNOSIS:   Increased nutrient needs related to chronic illness (COPD) as evidenced by estimated needs.  GOAL:   Patient will meet greater than or equal to 90% of their needs  MONITOR:   PO intake,Supplement acceptance,Skin,Weight trends,Labs,I & O's  REASON FOR ASSESSMENT:   Consult Assessment of nutrition requirement/status  ASSESSMENT:   65 y.o. female with medical history significant of laryngeal cancer status post laryngectomy, trach dependent, non-small cell lung cancer status post SBRT, COPD, DM, diabetic neuropathy, left BKA, fibromyalgia/chronic pain, hypertension, hyperlipidemia presented with shortness of breath. Pt with acute hypoxemic respiratory failure secondary to acute COPD exacerbation.   Pt continues on trach collar. RD unable to obtain pt nutrition history at this time. No percent meal completion recorded. RD to order nutritional supplements to aid in caloric and protein needs. Unable to complete Nutrition-Focused physical exam at this time.   Labs and medications reviewed. CBG 313-324 mg/dL.   Diet Order:   Diet Order            Diet heart healthy/carb modified Room service appropriate? Yes; Fluid consistency: Thin  Diet effective now                 EDUCATION NEEDS:   Not appropriate for education at this time  Skin:  Skin Assessment: Skin Integrity Issues: Skin Integrity Issues:: Stage II Stage II: R foot  Last BM:  5/13  Height:   Ht Readings from Last 1 Encounters:  07/07/20 5\' 6"  (1.676 m)    Weight:   Wt Readings from Last 1 Encounters:  07/08/20 125.5 kg    BMI:  Body mass index is 44.66 kg/m.  Estimated Nutritional Needs:   Kcal:  2000-2200  Protein:  110-125 grams  Fluid:  >/= 2 L/day  Chelsea Parker, MS, RD, LDN RD pager number/after hours weekend pager number on Am[facility].

## 2020-07-08 NOTE — Care Management (Signed)
    Durable Medical Equipment  (From admission, onward)         Start     Ordered   07/08/20 1447  For home use only DME standard manual wheelchair with seat cushion  Once       Comments: Patient suffers from amputation which impairs their ability to perform daily activities like walkingin the home.  A walker will not resolve issue with performing activities of daily living. A wheelchair will allow patient to safely perform daily activities. Patient can safely propel the wheelchair in the home or has a caregiver who can provide assistance. Length of need lifetime Accessories: elevating leg rests (ELRs), wheel locks, extensions and anti-tippers.   07/08/20 1447

## 2020-07-08 NOTE — Evaluation (Signed)
Occupational Therapy Evaluation Patient Details Name: [name] MRN: [id number] DOB: 1955/08/27 Today's Date: 07/08/2020    History of Present Illness [name] is a 65 y.o. female with medical history significant of laryngeal cancer status post laryngectomy, trach dependent, non-small cell lung cancer status post SBRT, COPD, insulin-dependent type 2 diabetes, diabetic neuropathy, left BKA, fibromyalgia/chronic pain, hypertension, hyperlipidemia presented to the ED for evaluation of shortness of breath. Admitted for Acute hypoxemic respiratory failure secondary to acute COPD exacerbation.   Clinical Impression   Patient admitted for the diagnosis above.  Patient stating she is at or near basline for mobility and ADL completion.  Spouse validates this.  She is hoping to return home today.  No acute or post acute OT needs.  Patient is in need of a new wheelchair, and it has been 5+ years with her current chair.  OT to recommend referral for new wheelchair.      Follow Up Recommendations  No OT follow up    Equipment Recommendations  Tub/shower bench;Wheelchair (20' wide).  Patient just purchased a new cushion.     Recommendations for Other Services       Precautions / Restrictions Precautions Precautions: Fall      Mobility Bed Mobility               General bed mobility comments: up in wheelchair    Transfers                      Balance  baseline                                         ADL either performed or assessed with clinical judgement   ADL Overall ADL's : At baseline                                             Vision Patient Visual Report: No change from baseline       Perception     Praxis      Pertinent Vitals/Pain Pain Assessment: No/denies pain Pain Intervention(s): Monitored during session     Hand Dominance Right   Extremity/Trunk Assessment Upper Extremity Assessment Upper Extremity  Assessment: Overall WFL for tasks assessed   Lower Extremity Assessment Lower Extremity Assessment: Defer to PT evaluation LLE Deficits / Details: BKA   Cervical / Trunk Assessment Cervical / Trunk Assessment: Kyphotic   Communication Communication Communication: Tracheostomy   Cognition Arousal/Alertness: Awake/alert Behavior During Therapy: WFL for tasks assessed/performed Overall Cognitive Status: Within Functional Limits for tasks assessed                                       Home Living Family/patient expects to be discharged to:: Private residence Living Arrangements: Spouse/significant other;Children Available Help at Discharge: Family;Available 24 hours/day Type of Home: House Home Access: Ramped entrance     Home Layout: One level     Bathroom Shower/Tub: Teacher, early years/pre: Standard Bathroom Accessibility: Yes   Home Equipment: Wheelchair - manual;Bedside commode   Additional Comments: She does not stand, uses sliding board.      Prior Functioning/Environment Level of Independence: Needs assistance  Gait / Transfers Assistance Needed: per chart: non ambulatory ADL's / Homemaking Assistance Needed: per patient and chart: Family assists getting into tub, states she can get on/off toilet and dress herself Communication / Swallowing Assistance Needed: Use of voice box          OT Problem List: Decreased strength      OT Treatment/Interventions:      OT Goals(Current goals can be found in the care plan section) Acute Rehab OT Goals Patient Stated Goal: hoping to go home today OT Goal Formulation: With patient Time For Goal Achievement: 07/08/20 Potential to Achieve Goals: Good  OT Frequency:     Barriers to D/C:            Co-evaluation              AM-PAC OT "6 Clicks" Daily Activity     Outcome Measure Help from another person eating meals?: None Help from another person taking care of personal  grooming?: None Help from another person toileting, which includes using toliet, bedpan, or urinal?: A Little Help from another person bathing (including washing, rinsing, drying)?: A Little Help from another person to put on and taking off regular upper body clothing?: None Help from another person to put on and taking off regular lower body clothing?: A Little 6 Click Score: 21   End of Session    Activity Tolerance: Patient tolerated treatment well Patient left: in chair;with call bell/phone within reach;with family/visitor present  OT Visit Diagnosis: Muscle weakness (generalized) (M62.81)                Time: 1610-9604 OT Time Calculation (min): 17 min Charges:  OT General Charges $OT Visit: 1 Visit OT Evaluation $OT Eval Moderate Complexity: 1 Mod  07/08/2020  Rich, OTR/L  Acute Rehabilitation Services  Office:  [phone]   Metta Clines 07/08/2020, 1:50 PM

## 2020-07-08 NOTE — TOC Initial Note (Signed)
Transition of Care [facility]) - Initial/Assessment Note    Patient Details  Name: [name] MRN: [id number] Date of Birth: [birth year]  [facility] of [facility]) CM/SW Contact:    Carles Collet, RN Phone Number: 07/08/2020, 2:53 PM  Clinical Narrative:           Damaris Schooner w patient at bedside, using voice assist device.  Patient admitted from home, lives with spouse and adult son. Trach dependent prior to admission, s/p laryngeal cancer. She states that she gets all her supplies through the mail. She was not oxygen dependent prior to admission.  L BKA, wheelchair bound. Per PT OT evals, WC is not functioning well, over 58 years old, and needs replaced. Patient agreeable to new WC. Orders and note placed, referral made to [employer] for delivery to room over the weekend. She states that her spouse will be able to provide transportation via private car upon DC.  TOC will continue to follow          Expected Discharge Plan: Home/Self Care Barriers to Discharge: Continued Medical Work up   Patient Goals and CMS Choice Patient states their goals for this hospitalization and ongoing recovery are:: to return home      Expected Discharge Plan and Services Expected Discharge Plan: Home/Self Care   Discharge Planning Services: CM Consult Post Acute Care Choice: Durable Medical Equipment Living arrangements for the past 2 months: Single Family Home                 DME Arranged: Wheelchair manual DME Agency: [employer] Date DME Agency Contacted: 07/08/20 Time DME Agency Contacted: [phone] Representative spoke with at DME Agency: Chrys Racer            Prior Living Arrangements/Services Living arrangements for the past 2 months: [place] Lives with:: Adult Children,Siblings              Current home services: DME    Activities of Daily Living      Permission Sought/Granted                  Emotional Assessment              Admission diagnosis:  COPD exacerbation  ([place]) [J44.1] Patient Active Problem List   Diagnosis Date Noted  . Acute hypoxemic respiratory failure ([place]) 07/08/2020  . Type 2 diabetes mellitus ([place]) 07/08/2020  . Hypertensive urgency 07/08/2020  . Hyperlipidemia 07/08/2020  . COPD exacerbation ([place]) 07/07/2020  . Fever and chills   . Hypoxia   . Abnormal CT of the chest 11/28/2017  . Non-small cell cancer of left lung ([place]) 10/17/2017  . Laryngeal cancer ([place]) 10/17/2017  . Post-operative pain   . Constipation due to pain medication   . Neuropathic pain   . Muscle spasm   . SIRS (systemic inflammatory response syndrome) ([place]) 09/18/2015  . Labile blood pressure   . DM type 2 with diabetic peripheral neuropathy ([place])   . Abnormality of gait   . Unilateral complete BKA ([place]) 09/15/2015  . Status post below knee amputation of left lower extremity   . Acute bronchitis   . Primary osteoarthritis of right knee   . Infection   . Sepsis ([place]) 09/09/2015  . Wound infection 09/09/2015  . 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 Type 2 diabetes mellitus with circulatory disorder ([place])   . Osteomyelitis of foot, acute, left 08/25/2015  . Diabetic foot infection ([place]) 04/19/2015  . Diabetic  foot ulcer ([place]) 04/19/2015  . CKD (chronic kidney disease) 04/19/2015  . Head and neck cancer ([place]) 04/19/2015  . Right hip pain 04/19/2015  . Infection of supraglottis (throat) 09/16/2014  . Incidental lung nodule, less than or equal to 73mm 09/16/2014  . Obstructive uropathy 03/22/2014  . COPD (chronic obstructive pulmonary disease) ([place]) 03/20/2014  . Controlled type 2 diabetes mellitus with diabetic nephropathy ([place]) 03/20/2014  . Lice infested hair 97/47/1855  . Former smoker 08/05/2012  . TINEA CORPORIS 09/01/2009  . DEPRESSION 08/11/2009  . Chronic pain syndrome 04/15/2007  . HYPERLIPIDEMIA NEC/NOS 11/02/2006  . Peripheral vascular disease ([place]) 11/02/2006  . MORBID OBESITY 10/28/2006  . Obstructive sleep  apnea 10/28/2006  . POLYNEUROPATHY 10/28/2006  . Benign essential HTN 10/28/2006  . OSTEOARTHRITIS, KNEES, BILATERAL 10/28/2006  . PROTEINURIA 11/25/2005   PCP:  Kerin Perna, NP Pharmacy:   [id number] - [place], [address] [place] [address] Phone: [phone] Fax: [phone]  [place], [place] - [address] [place] [address] Phone: [phone] Fax: [phone]     Social Determinants of Health ([place]) Interventions    Readmission Risk Interventions No flowsheet data found.

## 2020-07-08 NOTE — Progress Notes (Signed)
Patient admitted to the hospital earlier this morning by Dr. Marlowe Sax  Patient seen and examined.  She still short of breath.  Continues to have wheezing and cough.  Has bilateral wheezes on exam.  She has a left BKA, right lower extremity swelling with venous stasis  Assessment/plan:  Acute respiratory failure with hypoxia -Secondary to COPD exacerbation -Normally is already on oxygen -Has required up to 6 L of supplemental oxygen -Oxygen saturations are noted in the upper 80s on room air on arrival -Continue to wean off oxygen as tolerated  COPD exacerbation -Continues to have wheezing -Continue steroids, bronchodilators and antibiotics  Hypertension -Continued on amlodipine hydrochlorothiazide -Blood pressure currently stable  History of laryngeal cancer status post laryngectomy, trach dependent -Continue trach management per RT  Type 2 diabetes, insulin-dependent, uncontrolled with hyperglycemia -A1c of 8.3 -Blood sugars elevated in the setting of steroids -Change sliding scale to resistant -Continue Lantus  Diabetic neuropathy -Continue Lyrica  Chronic pain syndrome -Continue on Suboxone  Hyperlipidemia -Continue on Lipitor  [employer]

## 2020-07-08 NOTE — H&P (Signed)
History and Physical    [name]:[id number] DOB: [birth year] DOA: 07/07/2020  PCP: Kerin Perna, NP Patient coming from: Home  Chief Complaint: Shortness of breath  HPI: [name] is a 65 y.o. female with medical history significant of laryngeal cancer status post laryngectomy, trach dependent, non-small cell lung cancer status post SBRT, COPD, insulin-dependent type 2 diabetes, diabetic neuropathy, left BKA, fibromyalgia/chronic pain, hypertension, hyperlipidemia presented to the ED for evaluation of shortness of breath.  In the ED, she appeared dyspneic when speaking, intermittently hypoxic to the upper 80s on room air with movement in the bed, improved with 6 L supplemental oxygen.  She was wheezing and was given Solu-Medrol 125 mg and DuoNeb treatment.  Labs showing WBC 7.1, hemoglobin 14.6, platelet count 215K.  Sodium 141, potassium 3.9, chloride 104, bicarb 30, BUN 16, creatinine 1.1, glucose 206.  BNP within normal range.  COVID and influenza PCR negative.  Chest x-ray showing borderline cardiomegaly, streaky left perihilar opacity favoring atelectasis.  Patient reports 2-week history of progressively worsening shortness of breath.  She is also wheezing and coughing.  Reports hearing a rattling sound from her upper chest.  States sputum from her tracheostomy is thicker than usual.  States she has been using her COPD inhalers at home more than usual but they are not helping.  Also reports having a chronic ulcer on her right foot which she has been treating with alginate and bandages and she thinks it is improving.  Denies any fevers.  Review of Systems:  All systems reviewed and apart from history of presenting illness, are negative.  Past Medical History:  Diagnosis Date  . Anxiety   . Arthritis   . COPD (chronic obstructive pulmonary disease) ([place])   . Depression   . Diabetes mellitus without complication ([place])    INSULIN DEPENDENT  . Diabetic neuropathy ([place])   .  Difficult intubation    total laryngectomy  . Fibromyalgia   . Head and neck cancer ([place]) 04/19/2015  . Hypertension   . Kidney stone   . Morbid obesity with BMI of 40.0-44.9, adult ([place])   . Neuropathy   . Nocturia   . Pneumonia   . Rash, skin    face  . Renal insufficiency   . Shortness of breath   . Sleep apnea    has not used cpap past 10 yrs  . Urge incontinence     Past Surgical History:  Procedure Laterality Date  . AMPUTATION Left 08/25/2015   Procedure: 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;  Surgeon: Newt Minion, MD;  Location: [facility];  Service: Orthopedics;  Laterality: Left;  . AMPUTATION Left 09/13/2015   Procedure: AMPUTATION BELOW KNEE;  Surgeon: Marybelle Killings, MD;  Location: [facility];  Service: Orthopedics;  Laterality: Left;  . BRAIN SURGERY     vertebral aneurysm  . BREAST LUMPECTOMY  1982   benign  . CARPEL TUNNEL    . CHOLECYSTECTOMY    . CYSTOSCOPY W/ URETERAL STENT PLACEMENT Left 03/22/2014   Procedure: CYSTOSCOPY WITH RETROGRADE PYELOGRAM/URETERAL STENT PLACEMENT;  Surgeon: Alexis Frock, MD;  Location: [facility];  Service: Urology;  Laterality: Left;  . CYSTOSCOPY WITH RETROGRADE PYELOGRAM, URETEROSCOPY AND STENT PLACEMENT Left 05/11/2014   Procedure: CYSTOSCOPY WITH RETROGRADE PYELOGRAM, URETEROSCOPY AND STENT PLACEMENT;  Surgeon: Alexis Frock, MD;  Location: [facility];  Service: Urology;  Laterality: Left;  . HOLMIUM LASER APPLICATION Left 7/98/9211   Procedure: HOLMIUM LASER APPLICATION;  Surgeon: Alexis Frock, MD;  Location: [facility];  Service: Urology;  Laterality: Left;  . LARYNGECTOMY     total ( stoma)  . LEG AMPUTATION BELOW KNEE Left 09/13/2015  . SALIVARY STONE REMOVAL    . TONSILLECTOMY    . TUBAL LIGATION       reports that she quit smoking about 5 years ago. Her smoking use included cigarettes. She has a 40.00 pack-year smoking history. She has never used smokeless tobacco. She reports that she does not drink alcohol and does not use drugs.  Allergies   Allergen Reactions  . Ace Inhibitors Anaphylaxis and Swelling    Angioedema  . Zestril [Lisinopril] Anaphylaxis and Swelling    Angioedema    Family History  Adopted: Yes    Prior to Admission medications   Medication Sig Start Date End Date Taking? Authorizing Provider  albuterol (VENTOLIN HFA) 108 (90 Base) MCG/ACT inhaler Inhale 2 puffs into the lungs every 4 (four) hours as needed for wheezing or shortness of breath. 06/01/19  Yes Collene Gobble, MD  amLODipine (NORVASC) 10 MG tablet take 1 tablet (10 mg) by oral route once daily Patient taking differently: Take 10 mg by mouth daily. 04/30/16  Yes Newt Minion, MD  atorvastatin (LIPITOR) 20 MG tablet Take 20 mg by mouth daily. 04/13/20  Yes [provider]  Buprenorphine HCl-Naloxone HCl 8-2 MG FILM Place 1 Film under the tongue 3 (three) times daily.    Yes [provider]  cyclobenzaprine (FLEXERIL) 10 MG tablet Take 1 tablet by mouth every 12 (twelve) hours. 05/09/20  Yes [provider]  hydrochlorothiazide (HYDRODIURIL) 25 MG tablet Take 25 mg by mouth daily.   Yes [provider]  ibuprofen (ADVIL) 800 MG tablet Take 800 mg by mouth every 8 (eight) hours. 06/16/20  Yes [provider]  ibuprofen (ADVIL,MOTRIN) 200 MG tablet Take 400-600 mg by mouth every 6 (six) hours as needed for fever, headache or moderate pain.    Yes [provider]  insulin aspart (NOVOLOG) 100 UNIT/ML FlexPen Inject 3 Units into the skin 3 (three) times daily with meals.   Yes [provider]  Insulin Glargine (LANTUS SOLOSTAR) 100 UNIT/ML Solostar Pen Inject 17 Units into the skin daily. At 10AM Patient taking differently: Inject 20 Units into the skin daily. At 10AM 09/26/15  Yes Love, Ivan Anchors, PA-C  pregabalin (LYRICA) 75 MG capsule Take 75 mg by mouth 2 (two) times daily.   Yes [provider]  STIOLTO RESPIMAT 2.5-2.5 MCG/ACT AERS INHALE 2 PUFFS BY MOUTH INTO THE LUNGS DAILY Patient  taking differently: Inhale 2 puffs into the lungs daily. 05/11/20  Yes Collene Gobble, MD  azithromycin (ZITHROMAX) 250 MG tablet Take 1 tablet (250 mg total) by mouth daily. Two pill on day one, then one pill a day until gone Patient not taking: No sig reported 06/23/19   Collene Gobble, MD  blood glucose meter kit and supplies KIT Dispense based on patient and insurance preference. Use up to four times daily as directed. (FOR ICD-9 250.00, 250.01). 03/24/14   Bonnielee Haff, MD  Insulin Pen Needle (PEN NEEDLES 3/16") 31G X 5 MM MISC Use as directed. 03/24/14   Bonnielee Haff, MD  pregabalin (LYRICA) 50 MG capsule Take 1 capsule (50 mg total) by mouth 3 (three) times daily. Patient not taking: Reported on 07/07/2020 09/26/15   Bary Leriche, PA-C    Physical Exam: Vitals:   07/07/20 2200 07/08/20 0003 07/08/20 0055 07/08/20 0134  BP:  121/63   (!) 149/69  Pulse: (!) 59 87 89   Resp: 20 18 15    Temp:  99.2 F (37.3 C)    TempSrc:  Oral    SpO2: 96% 98% 96%   Weight:  125.5 kg    Height:        Physical Exam Constitutional:      General: She is not in acute distress. HENT:     Head: Normocephalic and atraumatic.  Eyes:     Extraocular Movements: Extraocular movements intact.     Conjunctiva/sclera: Conjunctivae normal.  Cardiovascular:     Rate and Rhythm: Normal rate and regular rhythm.     Pulses: Normal pulses.  Pulmonary:     Effort: Pulmonary effort is normal.     Breath sounds: Wheezing present. No rales.  Abdominal:     General: Bowel sounds are normal. There is no distension.     Palpations: Abdomen is soft.     Tenderness: There is no abdominal tenderness.  Musculoskeletal:     Cervical back: Normal range of motion and neck supple.     Comments: A shallow ulcer on the medial aspect of the right foot without erythema, drainage, or obvious signs of infection.  Skin:    General: Skin is warm and dry.  Neurological:     General: No focal deficit present.     Mental  Status: She is alert and oriented to person, place, and time.     Labs on Admission: I have personally reviewed following labs and imaging studies  CBC: Recent Labs  Lab 07/07/20 1650  WBC 7.1  NEUTROABS 5.1  HGB 14.6  HCT 44.6  MCV 87.6  PLT 132   Basic Metabolic Panel: Recent Labs  Lab 07/07/20 1650  NA 141  K 3.9  CL 104  CO2 30  GLUCOSE 206*  BUN 16  CREATININE 1.11*  CALCIUM 8.8*   GFR: Estimated Creatinine Clearance: 71.2 mL/min (A) (by C-G formula based on SCr of 1.11 mg/dL (H)). Liver Function Tests: No results for input(s): AST, ALT, ALKPHOS, BILITOT, PROT, ALBUMIN in the last 168 hours. No results for input(s): LIPASE, AMYLASE in the last 168 hours. No results for input(s): AMMONIA in the last 168 hours. Coagulation Profile: No results for input(s): INR, PROTIME in the last 168 hours. Cardiac Enzymes: No results for input(s): CKTOTAL, CKMB, CKMBINDEX, TROPONINI in the last 168 hours. BNP (last 3 results) No results for input(s): PROBNP in the last 8760 hours. HbA1C: Recent Labs    07/08/20 0200  HGBA1C 8.3*   CBG: No results for input(s): GLUCAP in the last 168 hours. Lipid Profile: No results for input(s): CHOL, HDL, LDLCALC, TRIG, CHOLHDL, LDLDIRECT in the last 72 hours. Thyroid Function Tests: No results for input(s): TSH, T4TOTAL, FREET4, T3FREE, THYROIDAB in the last 72 hours. Anemia Panel: No results for input(s): VITAMINB12, FOLATE, FERRITIN, TIBC, IRON, RETICCTPCT in the last 72 hours. Urine analysis:    Component Value Date/Time   COLORURINE YELLOW 06/15/2018 1155   APPEARANCEUR HAZY (A) 06/15/2018 1155   LABSPEC 1.018 06/15/2018 1155   PHURINE 5.0 06/15/2018 1155   GLUCOSEU NEGATIVE 06/15/2018 1155   HGBUR SMALL (A) 06/15/2018 1155   BILIRUBINUR NEGATIVE 06/15/2018 1155   KETONESUR NEGATIVE 06/15/2018 1155   PROTEINUR NEGATIVE 06/15/2018 1155   UROBILINOGEN 0.2 03/20/2014 0237   NITRITE POSITIVE (A) 06/15/2018 1155    LEUKOCYTESUR SMALL (A) 06/15/2018 1155    Radiological Exams on Admission: DG Chest 2 View  Result  Date: 07/07/2020 CLINICAL DATA:  Shortness of breath. EXAM: CHEST - 2 VIEW COMPARISON:  Radiograph 06/16/2018 FINDINGS: Borderline cardiomegaly. Streaky left perihilar opacity favors atelectasis. No confluent consolidation. No pulmonary edema, pleural effusion, or pneumothorax. Bones are under mineralized. IMPRESSION: Borderline cardiomegaly. Streaky left perihilar opacity favors atelectasis. Electronically Signed   By: Keith Rake M.D.   On: 07/07/2020 18:36    EKG: Independently reviewed.  Sinus rhythm, incomplete RBBB, LAFB, borderline PR prolongation.  Baseline wander in several leads.  No significant change since prior tracing.  Assessment/Plan Principal Problem:   COPD exacerbation (HCC) Active Problems:   Acute hypoxemic respiratory failure (HCC)   Type 2 diabetes mellitus (HCC)   Hypertensive urgency   Hyperlipidemia   Acute hypoxemic respiratory failure secondary to acute COPD exacerbation In the ED, she appeared dyspneic when speaking, intermittently hypoxic to the upper 80s on room air with movement in the bed, improved with 6 L supplemental oxygen.  She was wheezing and was given Solu-Medrol 125 mg and DuoNeb treatment. Chest x-ray showing borderline cardiomegaly, streaky left perihilar opacity favoring atelectasis.  Work of breathing has now improved but she continues to have wheezing. -Solu-Medrol 80 mg every 8 hours, DuoNeb every 6 hours, albuterol nebulizer as needed, Pulmicort neb twice daily, and ceftriaxone.  Continuous pulse ox.  Continue supplemental oxygen, wean as tolerated.  Hypertensive urgency Patient missed doses of her home medications yesterday and blood pressure currently elevated with systolic up to 208. -Resume home amlodipine and hydrochlorothiazide.  IV hydralazine PRN.   History of laryngeal cancer status post laryngectomy, trach dependent -RT  consulted for trach care  Insulin-dependent type 2 diabetes -Check A1c.  Sliding scale insulin moderate.  Resume home basal insulin.  Diabetic neuropathy -Continue Lyrica  Hyperlipidemia -Continue Lipitor    Chronic right foot ulcer Does not appear infected. -Wound care  DVT prophylaxis: Lovenox Code Status: Full code-discussed with the patient Family Communication: No family available at this time. Disposition Plan: Status is: Inpatient  Remains inpatient appropriate because:Inpatient level of care appropriate due to severity of illness   Dispo: The patient is from: Home              Anticipated d/c is to: Home              Patient currently is not medically stable to d/c.   Difficult to place patient No  Level of care: Level of care: Progressive   The medical decision making on this patient was of high complexity and the patient is at high risk for clinical deterioration, therefore this is a level 3 visit.  Shela Leff MD [facility]  If 7PM-7AM, please contact night-coverage [email or web address]  07/08/2020, 2:35 AM

## 2020-07-08 NOTE — Progress Notes (Signed)
2335 Pt arrived to floor via gurney, transferred into bed independantly.  Aox4 and verbally responsive, Sob on minimal exertion, trach collar in place @ 6L humidified oxygen, HOB elevated. Noted LBKA with sleeve, 2x1cm open area to R foot, no c/o pain at this time. Oriented to ward and room, call bell and personal belongings within reach.

## 2020-07-09 ENCOUNTER — Inpatient Hospital Stay (HOSPITAL_COMMUNITY): Payer: Medicaid Other

## 2020-07-09 DIAGNOSIS — E785 Hyperlipidemia, unspecified: Secondary | ICD-10-CM

## 2020-07-09 LAB — CBC
HCT: 45.9 % (ref 36.0–46.0)
Hemoglobin: 15.3 g/dL — ABNORMAL HIGH (ref 12.0–15.0)
MCH: 28.9 pg (ref 26.0–34.0)
MCHC: 33.3 g/dL (ref 30.0–36.0)
MCV: 86.6 fL (ref 80.0–100.0)
Platelets: 272 10*3/uL (ref 150–400)
RBC: 5.3 MIL/uL — ABNORMAL HIGH (ref 3.87–5.11)
RDW: 12.3 % (ref 11.5–15.5)
WBC: 11 10*3/uL — ABNORMAL HIGH (ref 4.0–10.5)
nRBC: 0 % (ref 0.0–0.2)

## 2020-07-09 LAB — BASIC METABOLIC PANEL
Anion gap: 10 (ref 5–15)
BUN: 35 mg/dL — ABNORMAL HIGH (ref 8–23)
CO2: 28 mmol/L (ref 22–32)
Calcium: 9.4 mg/dL (ref 8.9–10.3)
Chloride: 97 mmol/L — ABNORMAL LOW (ref 98–111)
Creatinine, Ser: 1.34 mg/dL — ABNORMAL HIGH (ref 0.44–1.00)
GFR, Estimated: 45 mL/min — ABNORMAL LOW (ref >60)
Glucose, Bld: 376 mg/dL — ABNORMAL HIGH (ref 70–99)
Potassium: 3.9 mmol/L (ref 3.5–5.1)
Sodium: 135 mmol/L (ref 135–145)

## 2020-07-09 LAB — GLUCOSE, CAPILLARY
Glucose-Capillary: 354 mg/dL — ABNORMAL HIGH (ref 70–99)
Glucose-Capillary: 303 mg/dL — ABNORMAL HIGH (ref 70–99)
Glucose-Capillary: 199 mg/dL — ABNORMAL HIGH (ref 70–99)
Glucose-Capillary: 282 mg/dL — ABNORMAL HIGH (ref 70–99)

## 2020-07-09 LAB — D-DIMER, QUANTITATIVE: D-Dimer, Quant: 0.88 ug/mL-FEU — ABNORMAL HIGH (ref 0.00–0.50)

## 2020-07-09 LAB — TSH: TSH: 1.265 u[IU]/mL (ref 0.350–4.500)

## 2020-07-09 MED ORDER — INSULIN GLARGINE 100 UNIT/ML ~~LOC~~ SOLN
10.0000 [IU] | Freq: Once | SUBCUTANEOUS | Status: AC
  Administered 2020-07-09: 10 [IU] via SUBCUTANEOUS
  Filled 2020-07-09: qty 0.1

## 2020-07-09 MED ORDER — DILTIAZEM HCL 25 MG/5ML IV SOLN
10.0000 mg | Freq: Once | INTRAVENOUS | Status: AC
  Administered 2020-07-09: 10 mg via INTRAVENOUS
  Filled 2020-07-09: qty 5

## 2020-07-09 MED ORDER — METOPROLOL TARTRATE 25 MG PO TABS
25.0000 mg | ORAL_TABLET | Freq: Two times a day (BID) | ORAL | Status: DC
  Administered 2020-07-09 – 2020-07-11 (×5): 25 mg via ORAL
  Filled 2020-07-09 (×5): qty 1

## 2020-07-09 MED ORDER — INSULIN GLARGINE 100 UNIT/ML ~~LOC~~ SOLN
40.0000 [IU] | Freq: Every day | SUBCUTANEOUS | Status: DC
  Administered 2020-07-10 – 2020-07-12 (×3): 40 [IU] via SUBCUTANEOUS
  Filled 2020-07-09 (×3): qty 0.4

## 2020-07-09 MED ORDER — APIXABAN 5 MG PO TABS
5.0000 mg | ORAL_TABLET | Freq: Two times a day (BID) | ORAL | Status: DC
  Administered 2020-07-09 – 2020-07-12 (×7): 5 mg via ORAL
  Filled 2020-07-09 (×7): qty 1

## 2020-07-09 MED ORDER — METHYLPREDNISOLONE SODIUM SUCC 125 MG IJ SOLR
60.0000 mg | Freq: Every day | INTRAMUSCULAR | Status: DC
  Administered 2020-07-09 – 2020-07-10 (×2): 60 mg via INTRAVENOUS
  Filled 2020-07-09 (×2): qty 2

## 2020-07-09 MED ORDER — TECHNETIUM TO 99M ALBUMIN AGGREGATED
4.4000 | Freq: Once | INTRAVENOUS | Status: AC | PRN
  Administered 2020-07-09: 4.4 via INTRAVENOUS

## 2020-07-09 MED ORDER — INSULIN ASPART 100 UNIT/ML IJ SOLN
6.0000 [IU] | Freq: Three times a day (TID) | INTRAMUSCULAR | Status: DC
  Administered 2020-07-09 – 2020-07-12 (×11): 6 [IU] via SUBCUTANEOUS

## 2020-07-09 NOTE — Consult Note (Signed)
[facility] Nurse Consult Note: Reason for Consult: small wound on anterior/medial right foot.  Patient has been dressing with an alginate and has noted improvement. Wound type: trauma, vs neuropathic Pressure Injury POA: N/A Measurement: To be obtained by bedside RN today prior to placement of first dressing Wound QJJ:HERD, moist according to ED Provider note.  Shallow, nondraining Drainage (amount, consistency, odor) None Periwound:No erythema according to provider note Dressing procedure/placement/frequency: I will provide Nursing with guidance via the Orders for the care of this wound using a soap and water cleanse, rinse and pat dry followed by placement of a size appropriate piece of silver hydrofiber (Aquacel Ag Advantage). This will be secured with a silicone foam dressing for atraumatic changes.  Heels are to be floated.  [place] nursing team will not follow, but will remain available to this patient, the nursing and medical teams.  Please re-consult if needed. Thanks, Maudie Flakes, MSN, RN, [place], Arther Abbott  Pager# [phone]

## 2020-07-09 NOTE — Progress Notes (Signed)
ANTICOAGULATION CONSULT NOTE - Initial Consult  Pharmacy Consult for apixaban Indication: atrial fibrillation  Allergies  Allergen Reactions  . Ace Inhibitors Anaphylaxis and Swelling    Angioedema  . Zestril [Lisinopril] Anaphylaxis and Swelling    Angioedema    Patient Measurements: Height: 5\' 6"  (167.6 cm) Weight: 125.5 kg (276 lb 10.8 oz) IBW/kg (Calculated) : 59.3   Vital Signs: Temp: 98.5 F (36.9 C) (05/15 0725) Temp Source: Oral (05/15 0725) BP: 132/75 (05/15 0757) Pulse Rate: 127 (05/15 0857)  Labs: Recent Labs    07/07/20 1650  HGB 14.6  HCT 44.6  PLT 215  CREATININE 1.11*    Estimated Creatinine Clearance: 71.2 mL/min (A) (by C-G formula based on SCr of 1.11 mg/dL (H)).  Assessment: 65 yo F with PMH of obesity, HTN, and DM admitted for COPD exacerbation and now has developed afib. Upon calculation CHADS2VASC=3 indicating mod-high risk and indication for anticoagulation and pharmacy has been consulted to dose apixaban for this indication.  65 yo, 125kg (BMI 44), Scr 1.11 - no dose reduction necessary  CBC: Hgb 14.6, Plt 215  Goal of Therapy:  Monitor platelets by anticoagulation protocol: Yes   Plan:  Initiate apixaban 5mg  BID  F/u s/sx bleeding, CBC as needed   Wilson Singer, PharmD PGY1 Pharmacy Resident 07/09/2020 9:38 AM

## 2020-07-09 NOTE — Progress Notes (Addendum)
PROGRESS NOTE    [name]  WRU:[id number] DOB: September 24, 1955 DOA: 07/07/2020 PCP: Kerin Perna, NP    Brief Narrative:  65 y/o female with history of laryngeal cancer s/p laryngectomy, trach dependent, copd, DM, presents to ED with complaints of shortness of breath. Found to have COPD exacerbation and admitted for further treatments. Hospital course complicated by development of A fib. She has been started on rate control and anticoagulation   Assessment & Plan:   Principal Problem:   COPD exacerbation (HCC) Active Problems:   Acute hypoxemic respiratory failure (HCC)   Type 2 diabetes mellitus ([place])   Hypertensive urgency   Hyperlipidemia   Pressure injury of skin   Acute respiratory failure with hypoxia -Secondary to COPD exacerbation -Normally is not on any oxygen at home -Oxygen saturations are noted in the upper 80s on room air on arrival -currently requiring up to 8-9L -will check D dimer -Continue to wean off oxygen as tolerated  COPD exacerbation -wheezing sounds better -will start to wean steroids, continue bronchodilators and antibiotics  New onset A fib -suspect that she may have a history of paroxysmal A fib, since she has described intermittent episodes of lightheaded, similar to what she is experiencing now -will start on metoprolol for rate control -check TSH, Echo -CHADSVASC at least 3 -will start apixiban, discussed with patient who is agreeable  Hypertension -Continued on amlodipine hydrochlorothiazide -Blood pressure currently stable  History of laryngeal cancer status post laryngectomy, trach dependent -Continue trach management per RT  Type 2 diabetes, insulin-dependent, uncontrolled with hyperglycemia -A1c of 8.3 -Blood sugars elevated in the setting of steroids -Change sliding scale to resistant -Continue Lantus  Diabetic neuropathy -Continue Lyrica  Chronic pain syndrome -Continue on  Suboxone  Hyperlipidemia -Continue on Lipitor  Pressure injury Pressure Injury 07/08/20 Foot Anterior;Right Stage 2 -  Partial thickness loss of dermis presenting as a shallow open injury with a red, pink wound bed without slough. (Active)  07/08/20 0000  Location: Foot  Location Orientation: Anterior;Right  Staging: Stage 2 -  Partial thickness loss of dermis presenting as a shallow open injury with a red, pink wound bed without slough.  Wound Description (Comments):   Present on Admission: Yes  continue supportive care per nursing   DVT prophylaxis: apixaban  Code Status: full code Family Communication: discussed with patient Disposition Plan: Status is: Inpatient  Remains inpatient appropriate because:Ongoing diagnostic testing needed not appropriate for outpatient work up, IV treatments appropriate due to intensity of illness or inability to take PO and Inpatient level of care appropriate due to severity of illness   Dispo: The patient is from: Home              Anticipated d/c is to: Home              Patient currently is not medically stable to d/c.   Difficult to place patient No         Consultants:     Procedures:     Antimicrobials:   Ceftriaxone 5/15>   Subjective: Feels that her breathing is doing better today. She is not coughing as much and feels that she is wheezing less. She does report dizziness and lightheadedness. Staff reports that it was noted that she went into rapid a fib overnight around 3am. Patient reports that she has intermittently had these episodes at home where they last a few mins and then self resolve. She does not have any chest pain at present. Staff reports  that her oxygen requirement increased overnight to 8-9L  Objective: Vitals:   07/09/20 0725 07/09/20 0726 07/09/20 0757 07/09/20 0857  BP: (!) 146/91  132/75   Pulse: (!) 110   (!) 127  Resp: 17     Temp: 98.5 F (36.9 C)     TempSrc: Oral     SpO2: 92% 94%     Weight:      Height:        Intake/Output Summary (Last 24 hours) at 07/09/2020 0932 Last data filed at 07/09/2020 0455 Gross per 24 hour  Intake 430 ml  Output 3000 ml  Net -2570 ml   Filed Weights   07/07/20 1621 07/08/20 0003  Weight: 102 kg 125.5 kg    Examination:  General exam: Appears calm and comfortable  Respiratory system: Clear to auscultation. Respiratory effort normal. Tracheostomy in place Cardiovascular system: S1 & S2 heard, irregular. No JVD, murmurs, rubs, gallops or clicks.  Gastrointestinal system: Abdomen is nondistended, soft and nontender. No organomegaly or masses felt. Normal bowel sounds heard. Central nervous system: Alert and oriented. No focal neurological deficits. Extremities: left bka, right lower extremity with swelling and venous stasis changes Skin: No rashes, lesions or ulcers Psychiatry: Judgement and insight appear normal. Mood & affect appropriate.     Data Reviewed: I have personally reviewed following labs and imaging studies  CBC: Recent Labs  Lab 07/07/20 1650  WBC 7.1  NEUTROABS 5.1  HGB 14.6  HCT 44.6  MCV 87.6  PLT 458   Basic Metabolic Panel: Recent Labs  Lab 07/07/20 1650  NA 141  K 3.9  CL 104  CO2 30  GLUCOSE 206*  BUN 16  CREATININE 1.11*  CALCIUM 8.8*   GFR: Estimated Creatinine Clearance: 71.2 mL/min (A) (by C-G formula based on SCr of 1.11 mg/dL (H)). Liver Function Tests: No results for input(s): AST, ALT, ALKPHOS, BILITOT, PROT, ALBUMIN in the last 168 hours. No results for input(s): LIPASE, AMYLASE in the last 168 hours. No results for input(s): AMMONIA in the last 168 hours. Coagulation Profile: No results for input(s): INR, PROTIME in the last 168 hours. Cardiac Enzymes: No results for input(s): CKTOTAL, CKMB, CKMBINDEX, TROPONINI in the last 168 hours. BNP (last 3 results) No results for input(s): PROBNP in the last 8760 hours. HbA1C: Recent Labs    07/08/20 0200  HGBA1C 8.3*    CBG: Recent Labs  Lab 07/08/20 0741 07/08/20 1147 07/08/20 1630 07/08/20 2133 07/09/20 0726  GLUCAP 313* 317* 398* 278* 354*   Lipid Profile: No results for input(s): CHOL, HDL, LDLCALC, TRIG, CHOLHDL, LDLDIRECT in the last 72 hours. Thyroid Function Tests: No results for input(s): TSH, T4TOTAL, FREET4, T3FREE, THYROIDAB in the last 72 hours. Anemia Panel: No results for input(s): VITAMINB12, FOLATE, FERRITIN, TIBC, IRON, RETICCTPCT in the last 72 hours. Sepsis Labs: No results for input(s): PROCALCITON, LATICACIDVEN in the last 168 hours.  Recent Results (from the past 240 hour(s))  Resp Panel by RT-PCR (Flu A&B, Covid) Nasopharyngeal Swab     Status: None   Collection Time: 07/07/20  7:28 PM   Specimen: Nasopharyngeal Swab; Nasopharyngeal(NP) swabs in vial transport medium  Result Value Ref Range Status   SARS Coronavirus 2 by RT PCR NEGATIVE NEGATIVE Final    Comment: (NOTE) SARS-CoV-2 target nucleic acids are NOT DETECTED.  The SARS-CoV-2 RNA is generally detectable in upper respiratory specimens during the acute phase of infection. The lowest concentration of SARS-CoV-2 viral copies this assay can detect is 138 copies/mL. A  negative result does not preclude SARS-Cov-2 infection and should not be used as the sole basis for treatment or other patient management decisions. A negative result may occur with  improper specimen collection/handling, submission of specimen other than nasopharyngeal swab, presence of viral mutation(s) within the areas targeted by this assay, and inadequate number of viral copies(<138 copies/mL). A negative result must be combined with clinical observations, patient history, and epidemiological information. The expected result is Negative.  Fact Sheet for Patients:  EntrepreneurPulse.com.au  Fact Sheet for Healthcare Providers:  IncredibleEmployment.be  This test is no t yet approved or cleared by the  [place] FDA and  has been authorized for detection and/or diagnosis of SARS-CoV-2 by FDA under an Emergency Use Authorization (EUA). This EUA will remain  in effect (meaning this test can be used) for the duration of the COVID-19 declaration under Section 564(b)(1) of the Act, 21 U.S.C.section 360bbb-3(b)(1), unless the authorization is terminated  or revoked sooner.       Influenza A by PCR NEGATIVE NEGATIVE Final   Influenza B by PCR NEGATIVE NEGATIVE Final    Comment: (NOTE) The Xpert Xpress SARS-CoV-2/FLU/RSV plus assay is intended as an aid in the diagnosis of influenza from Nasopharyngeal swab specimens and should not be used as a sole basis for treatment. Nasal washings and aspirates are unacceptable for Xpert Xpress SARS-CoV-2/FLU/RSV testing.  Fact Sheet for Patients: EntrepreneurPulse.com.au  Fact Sheet for Healthcare Providers: IncredibleEmployment.be  This test is not yet approved or cleared by the [place] FDA and has been authorized for detection and/or diagnosis of SARS-CoV-2 by FDA under an Emergency Use Authorization (EUA). This EUA will remain in effect (meaning this test can be used) for the duration of the COVID-19 declaration under Section 564(b)(1) of the Act, 21 U.S.C. section 360bbb-3(b)(1), unless the authorization is terminated or revoked.  Performed at [facility], [place] [address] [place], [place] [address]          Radiology Studies: DG Chest 2 View  Result Date: 07/07/2020 CLINICAL DATA:  Shortness of breath. EXAM: CHEST - 2 VIEW COMPARISON:  Radiograph 06/16/2018 FINDINGS: Borderline cardiomegaly. Streaky left perihilar opacity favors atelectasis. No confluent consolidation. No pulmonary edema, pleural effusion, or pneumothorax. Bones are under mineralized. IMPRESSION: Borderline cardiomegaly. Streaky left perihilar opacity favors atelectasis. Electronically Signed   By: Keith Rake  M.D.   On: 07/07/2020 18:36        Scheduled Meds: . amLODipine  10 mg Oral Daily  . atorvastatin  20 mg Oral Daily  . budesonide (PULMICORT) nebulizer solution  0.25 mg Nebulization BID  . buprenorphine-naloxone  1 tablet Sublingual TID  . cyclobenzaprine  10 mg Oral BID  . hydrochlorothiazide  25 mg Oral Daily  . insulin aspart  0-20 Units Subcutaneous TID WC  . insulin aspart  0-5 Units Subcutaneous QHS  . insulin aspart  6 Units Subcutaneous TID WC  . [START ON 07/10/2020] insulin glargine  40 Units Subcutaneous Daily  . ipratropium-albuterol  3 mL Nebulization BID  . methylPREDNISolone (SOLU-MEDROL) injection  60 mg Intravenous Daily  . metoprolol tartrate  25 mg Oral BID  . pregabalin  75 mg Oral BID  . Ensure Max Protein  11 oz Oral BID   Continuous Infusions: . cefTRIAXone (ROCEPHIN)  IV Stopped (07/09/20 0844)     LOS: 2 days    Time spent: 35 mins    Kathie Dike, MD [facility]   If 7PM-7AM, please contact night-coverage [email or web address]  07/09/2020, 9:32  AM

## 2020-07-09 NOTE — Discharge Instructions (Signed)

## 2020-07-10 ENCOUNTER — Inpatient Hospital Stay (HOSPITAL_COMMUNITY): Payer: Medicaid Other

## 2020-07-10 DIAGNOSIS — I4891 Unspecified atrial fibrillation: Secondary | ICD-10-CM | POA: Diagnosis not present

## 2020-07-10 LAB — ECHOCARDIOGRAM COMPLETE
AR max vel: 3.19 cm2
AV Area VTI: 3.72 cm2
AV Area mean vel: 3.36 cm2
AV Mean grad: 3.3 mmHg
AV Peak grad: 5.7 mmHg
Ao pk vel: 1.19 m/s
Area-P 1/2: 3.6 cm2
Height: 66 in
S' Lateral: 2.6 cm
Weight: 4426.84 oz

## 2020-07-10 LAB — GLUCOSE, CAPILLARY
Glucose-Capillary: 222 mg/dL — ABNORMAL HIGH (ref 70–99)
Glucose-Capillary: 171 mg/dL — ABNORMAL HIGH (ref 70–99)
Glucose-Capillary: 308 mg/dL — ABNORMAL HIGH (ref 70–99)

## 2020-07-10 MED ORDER — METHYLPREDNISOLONE SODIUM SUCC 125 MG IJ SOLR
60.0000 mg | Freq: Two times a day (BID) | INTRAMUSCULAR | Status: DC
  Administered 2020-07-10 – 2020-07-11 (×2): 60 mg via INTRAVENOUS
  Filled 2020-07-10 (×2): qty 2

## 2020-07-10 MED ORDER — DILTIAZEM HCL-DEXTROSE 125-5 MG/125ML-% IV SOLN (PREMIX)
5.0000 mg/h | INTRAVENOUS | Status: DC
  Administered 2020-07-10: 5 mg/h via INTRAVENOUS
  Administered 2020-07-11: 10 mg/h via INTRAVENOUS
  Filled 2020-07-10 (×3): qty 125

## 2020-07-10 NOTE — Progress Notes (Signed)
PROGRESS NOTE    [name]  TOI:[id number] DOB: 1955/03/14 DOA: 07/07/2020 PCP: Kerin Perna, NP    Brief Narrative:  65 y/o female with history of laryngeal cancer s/p laryngectomy, trach dependent, copd, DM, presents to ED with complaints of shortness of breath. Found to have COPD exacerbation and admitted for further treatments. Hospital course complicated by development of A fib. She has been started on rate control and anticoagulation   Assessment & Plan:   Principal Problem:   COPD exacerbation (HCC) Active Problems:   Acute hypoxemic respiratory failure (HCC)   Type 2 diabetes mellitus ([place])   Hypertensive urgency   Hyperlipidemia   Pressure injury of skin   Acute respiratory failure with hypoxia -Secondary to COPD exacerbation -Normally is not on any oxygen at home -Oxygen saturations are noted in the upper 80s on room air on arrival -currently requiring up to 8-9L -D-dimer mildly elevated, VQ scan negative for PE -Continue to wean off oxygen as tolerated  COPD exacerbation -Continues to have wheezing -Continue steroids, continue bronchodilators and antibiotics  New onset A fib -suspect that she may have a history of paroxysmal A fib, since she has described intermittent episodes of lightheaded, similar to what she is experiencing now -Currently on metoprolol for rate control, although rate is in the 130s -Will start on Cardizem infusion -TSH normal -Echo shows normal ejection fraction, although does comment on dilated left atrium -CHADSVASC at least 3 -Anticoagulated with apixaban  Hypertension -Continued on amlodipine hydrochlorothiazide -Blood pressure currently stable  History of laryngeal cancer status post laryngectomy, trach dependent -Continue trach management per RT  Type 2 diabetes, insulin-dependent, uncontrolled with hyperglycemia -A1c of 8.3 -Blood sugars elevated in the setting of steroids -Change sliding scale to  resistant -Continue Lantus  Diabetic neuropathy -Continue Lyrica  Chronic pain syndrome -Continue on Suboxone  Hyperlipidemia -Continue on Lipitor  Pressure injury Pressure Injury 07/08/20 Foot Anterior;Right Stage 2 -  Partial thickness loss of dermis presenting as a shallow open injury with a red, pink wound bed without slough. (Active)  07/08/20 0000  Location: Foot  Location Orientation: Anterior;Right  Staging: Stage 2 -  Partial thickness loss of dermis presenting as a shallow open injury with a red, pink wound bed without slough.  Wound Description (Comments):   Present on Admission: Yes  continue supportive care per nursing   DVT prophylaxis: apixaban apixaban (ELIQUIS) tablet 5 mg  Code Status: full code Family Communication: discussed with patient Disposition Plan: Status is: Inpatient  Remains inpatient appropriate because:Ongoing diagnostic testing needed not appropriate for outpatient work up, IV treatments appropriate due to intensity of illness or inability to take PO and Inpatient level of care appropriate due to severity of illness   Dispo: The patient is from: Home              Anticipated d/c is to: Home              Patient currently is not medically stable to d/c.   Difficult to place patient No         Consultants:     Procedures:   Echocardiogram  Antimicrobials:   Ceftriaxone 5/15>   Subjective: Feels that her breathing is little better today.  Still has periods of dizziness.  Heart rate noted to be elevated.  Objective: Vitals:   07/10/20 1107 07/10/20 1417 07/10/20 1527 07/10/20 1602  BP:  108/67  124/88  Pulse: 100 (!) 113 87 (!) 105  Resp: 18 20 18  20  Temp:  97.8 F (36.6 C)  97.8 F (36.6 C)  TempSrc:  Oral  Oral  SpO2: 95% 92% 92% 94%  Weight:      Height:        Intake/Output Summary (Last 24 hours) at 07/10/2020 1853 Last data filed at 07/10/2020 1800 Gross per 24 hour  Intake 1060 ml  Output 2850 ml   Net -1790 ml   Filed Weights   07/07/20 1621 07/08/20 0003  Weight: 102 kg 125.5 kg    Examination:  General exam: Alert, awake, oriented x 3 Respiratory system: bilateral wheezes. Respiratory effort normal. Tracheostomy in place Cardiovascular system:RRR. No murmurs, rubs, gallops. Gastrointestinal system: Abdomen is nondistended, soft and nontender. No organomegaly or masses felt. Normal bowel sounds heard. Central nervous system: Alert and oriented. No focal neurological deficits. Extremities: left bka, RLE edema with venous stasis Skin: No rashes, lesions or ulcers Psychiatry: Judgement and insight appear normal. Mood & affect appropriate.      Data Reviewed: I have personally reviewed following labs and imaging studies  CBC: Recent Labs  Lab 07/07/20 1650 07/09/20 0950  WBC 7.1 11.0*  NEUTROABS 5.1  --   HGB 14.6 15.3*  HCT 44.6 45.9  MCV 87.6 86.6  PLT 215 016   Basic Metabolic Panel: Recent Labs  Lab 07/07/20 1650 07/09/20 0950  NA 141 135  K 3.9 3.9  CL 104 97*  CO2 30 28  GLUCOSE 206* 376*  BUN 16 35*  CREATININE 1.11* 1.34*  CALCIUM 8.8* 9.4   GFR: Estimated Creatinine Clearance: 59 mL/min (A) (by C-G formula based on SCr of 1.34 mg/dL (H)). Liver Function Tests: No results for input(s): AST, ALT, ALKPHOS, BILITOT, PROT, ALBUMIN in the last 168 hours. No results for input(s): LIPASE, AMYLASE in the last 168 hours. No results for input(s): AMMONIA in the last 168 hours. Coagulation Profile: No results for input(s): INR, PROTIME in the last 168 hours. Cardiac Enzymes: No results for input(s): CKTOTAL, CKMB, CKMBINDEX, TROPONINI in the last 168 hours. BNP (last 3 results) No results for input(s): PROBNP in the last 8760 hours. HbA1C: Recent Labs    07/08/20 0200  HGBA1C 8.3*   CBG: Recent Labs  Lab 07/09/20 1123 07/09/20 1722 07/09/20 2137 07/10/20 0742 07/10/20 1202  GLUCAP 303* 199* 282* 222* 171*   Lipid Profile: No results for  input(s): CHOL, HDL, LDLCALC, TRIG, CHOLHDL, LDLDIRECT in the last 72 hours. Thyroid Function Tests: Recent Labs    07/09/20 0950  TSH 1.265   Anemia Panel: No results for input(s): VITAMINB12, FOLATE, FERRITIN, TIBC, IRON, RETICCTPCT in the last 72 hours. Sepsis Labs: No results for input(s): PROCALCITON, LATICACIDVEN in the last 168 hours.  Recent Results (from the past 240 hour(s))  Resp Panel by RT-PCR (Flu A&B, Covid) Nasopharyngeal Swab     Status: None   Collection Time: 07/07/20  7:28 PM   Specimen: Nasopharyngeal Swab; Nasopharyngeal(NP) swabs in vial transport medium  Result Value Ref Range Status   SARS Coronavirus 2 by RT PCR NEGATIVE NEGATIVE Final    Comment: (NOTE) SARS-CoV-2 target nucleic acids are NOT DETECTED.  The SARS-CoV-2 RNA is generally detectable in upper respiratory specimens during the acute phase of infection. The lowest concentration of SARS-CoV-2 viral copies this assay can detect is 138 copies/mL. A negative result does not preclude SARS-Cov-2 infection and should not be used as the sole basis for treatment or other patient management decisions. A negative result may occur with  improper specimen collection/handling,  submission of specimen other than nasopharyngeal swab, presence of viral mutation(s) within the areas targeted by this assay, and inadequate number of viral copies(<138 copies/mL). A negative result must be combined with clinical observations, patient history, and epidemiological information. The expected result is Negative.  Fact Sheet for Patients:  EntrepreneurPulse.com.au  Fact Sheet for Healthcare Providers:  IncredibleEmployment.be  This test is no t yet approved or cleared by the [place] FDA and  has been authorized for detection and/or diagnosis of SARS-CoV-2 by FDA under an Emergency Use Authorization (EUA). This EUA will remain  in effect (meaning this test can be used) for the  duration of the COVID-19 declaration under Section 564(b)(1) of the Act, 21 U.S.C.section 360bbb-3(b)(1), unless the authorization is terminated  or revoked sooner.       Influenza A by PCR NEGATIVE NEGATIVE Final   Influenza B by PCR NEGATIVE NEGATIVE Final    Comment: (NOTE) The Xpert Xpress SARS-CoV-2/FLU/RSV plus assay is intended as an aid in the diagnosis of influenza from Nasopharyngeal swab specimens and should not be used as a sole basis for treatment. Nasal washings and aspirates are unacceptable for Xpert Xpress SARS-CoV-2/FLU/RSV testing.  Fact Sheet for Patients: EntrepreneurPulse.com.au  Fact Sheet for Healthcare Providers: IncredibleEmployment.be  This test is not yet approved or cleared by the [place] FDA and has been authorized for detection and/or diagnosis of SARS-CoV-2 by FDA under an Emergency Use Authorization (EUA). This EUA will remain in effect (meaning this test can be used) for the duration of the COVID-19 declaration under Section 564(b)(1) of the Act, 21 U.S.C. section 360bbb-3(b)(1), unless the authorization is terminated or revoked.  Performed at [facility], [place] [address] [place], [place] [address]          Radiology Studies: NM Pulmonary Perfusion  Result Date: 07/10/2020 CLINICAL DATA:  Elevated D-dimer. Concern for pulmonary embolism. Wheezing. EXAM: NUCLEAR MEDICINE PERFUSION LUNG SCAN TECHNIQUE: Perfusion images were obtained in multiple projections after intravenous injection of radiopharmaceutical. RADIOPHARMACEUTICALS:  4.4 mCi Tc-54m MAA COMPARISON:  Chest radiograph 07/09/2020, CT 11/14/2017. FINDINGS: No wedge-shaped peripheral perfusion defects to localize acute pulmonary embolism. Band of lack of perfusion in the LEFT upper lobe has a linear pattern and corresponds to a band of linear pleuroparenchymal thickening on comparison radiograph and CT. IMPRESSION: No evidence acute  pulmonary embolism Electronically Signed   By: Suzy Bouchard M.D.   On: 07/10/2020 08:21   DG CHEST PORT 1 VIEW  Result Date: 07/10/2020 CLINICAL DATA:  65 year old female with shortness of breath. EXAM: PORTABLE CHEST - 1 VIEW COMPARISON:  07/07/2020, 11/14/2017 FINDINGS: The mediastinal contours are within normal limits. Unchanged mild cardiomegaly. Tracheostomy cannula remains in place. Similar appearing left upper lobe cicatricial atelectasis. The lungs are otherwise clear bilaterally without evidence of focal consolidation, pleural effusion, or pneumothorax. No acute osseous abnormality. IMPRESSION: 1. No acute cardiopulmonary process. 2. Tracheostomy cannula remains in place. 3. Similar-appearing left upper lobe cicatricial atelectasis. Electronically Signed   By: Ruthann Cancer MD   On: 07/10/2020 08:20   ECHOCARDIOGRAM COMPLETE  Result Date: 07/10/2020    ECHOCARDIOGRAM REPORT   Patient Name:   [name] [facility] Date of Exam: 07/10/2020 Medical Rec #:  [id number]    Height:       66.0 in Accession #:    [id number]   Weight:       276.7 lb Date of Birth:  1955-09-08    BSA:          2.296  m Patient Age:    61 years     BP:           104/76 mmHg Patient Gender: F            HR:           78 bpm. Exam Location:  Inpatient Procedure: 2D Echo, Cardiac Doppler and Color Doppler Indications:    Atrial Fibrillation  History:        Patient has no prior history of Echocardiogram examinations.                 COPD; Risk Factors:Hypertension and Diabetes.  Sonographer:    Cammy Brochure Referring Phys: [phone] [facility] Judye Lorino  Sonographer Comments: Image acquisition challenging due to patient body habitus and Image acquisition challenging due to COPD. IMPRESSIONS  1. Left ventricular ejection fraction, by estimation, is 65 to 70%. The left ventricle has normal function. The left ventricle has no regional wall motion abnormalities. There is mild left ventricular hypertrophy. Left ventricular diastolic parameters are  indeterminate.  2. Right ventricular systolic function is normal. The right ventricular size is normal.  3. Left atrial size was severely dilated.  4. No evidence of mitral valve regurgitation.  5. The aortic valve is tricuspid. Aortic valve regurgitation is not visualized. Mild aortic valve sclerosis is present, with no evidence of aortic valve stenosis.  6. The inferior vena cava is normal in size with greater than 50% respiratory variability, suggesting right atrial pressure of 3 mmHg. FINDINGS  Left Ventricle: Left ventricular ejection fraction, by estimation, is 65 to 70%. The left ventricle has normal function. The left ventricle has no regional wall motion abnormalities. The left ventricular internal cavity size was normal in size. There is  mild left ventricular hypertrophy. Left ventricular diastolic parameters are indeterminate. Right Ventricle: The right ventricular size is normal. No increase in right ventricular wall thickness. Right ventricular systolic function is normal. Left Atrium: Left atrial size was severely dilated. Right Atrium: Right atrial size was normal in size. Pericardium: There is no evidence of pericardial effusion. Mitral Valve: Mild mitral annular calcification. No evidence of mitral valve regurgitation. Tricuspid Valve: The tricuspid valve is normal in structure. Tricuspid valve regurgitation is trivial. Aortic Valve: The aortic valve is tricuspid. Aortic valve regurgitation is not visualized. Mild aortic valve sclerosis is present, with no evidence of aortic valve stenosis. Aortic valve mean gradient measures 3.2 mmHg. Aortic valve peak gradient measures 5.7 mmHg. Aortic valve area, by VTI measures 3.72 cm. Pulmonic Valve: The pulmonic valve was not well visualized. Pulmonic valve regurgitation is not visualized. Aorta: The aortic root is normal in size and structure. Venous: The inferior vena cava is normal in size with greater than 50% respiratory variability, suggesting right  atrial pressure of 3 mmHg. IAS/Shunts: No atrial level shunt detected by color flow Doppler.  LEFT VENTRICLE PLAX 2D LVIDd:         4.00 cm LVIDs:         2.60 cm LV PW:         1.40 cm LV IVS:        1.40 cm LVOT diam:     2.20 cm LV SV:         71 LV SV Index:   31 LVOT Area:     3.80 cm  RIGHT VENTRICLE             IVC RV S prime:     11.90 cm/s  IVC diam:  1.20 cm TAPSE (M-mode): 2.4 cm LEFT ATRIUM              Index       RIGHT ATRIUM           Index LA diam:        4.10 cm  1.79 cm/m  RA Area:     19.00 cm LA Vol (A2C):   132.0 ml 57.50 ml/m RA Volume:   51.70 ml  22.52 ml/m LA Vol (A4C):   163.0 ml 71.01 ml/m LA Biplane Vol: 150.0 ml 65.34 ml/m  AORTIC VALVE AV Area (Vmax):    3.19 cm AV Area (Vmean):   3.36 cm AV Area (VTI):     3.72 cm AV Vmax:           118.90 cm/s AV Vmean:          81.450 cm/s AV VTI:            0.190 m AV Peak Grad:      5.7 mmHg AV Mean Grad:      3.2 mmHg LVOT Vmax:         99.80 cm/s LVOT Vmean:        72.033 cm/s LVOT VTI:          0.186 m LVOT/AV VTI ratio: 0.98  AORTA Ao Root diam: 3.20 cm Ao Asc diam:  3.90 cm MITRAL VALVE MV Area (PHT): 3.60 cm     SHUNTS MV Decel Time: 211 msec     Systemic VTI:  0.19 m MV E velocity: 148.00 cm/s  Systemic Diam: 2.20 cm Dorris Carnes MD Electronically signed by Dorris Carnes MD Signature Date/Time: 07/10/2020/2:34:43 PM    Final         Scheduled Meds: . apixaban  5 mg Oral BID  . atorvastatin  20 mg Oral Daily  . budesonide (PULMICORT) nebulizer solution  0.25 mg Nebulization BID  . buprenorphine-naloxone  1 tablet Sublingual TID  . cyclobenzaprine  10 mg Oral BID  . hydrochlorothiazide  25 mg Oral Daily  . insulin aspart  0-20 Units Subcutaneous TID WC  . insulin aspart  0-5 Units Subcutaneous QHS  . insulin aspart  6 Units Subcutaneous TID WC  . insulin glargine  40 Units Subcutaneous Daily  . ipratropium-albuterol  3 mL Nebulization BID  . methylPREDNISolone (SOLU-MEDROL) injection  60 mg Intravenous Daily  .  metoprolol tartrate  25 mg Oral BID  . pregabalin  75 mg Oral BID  . Ensure Max Protein  11 oz Oral BID   Continuous Infusions: . cefTRIAXone (ROCEPHIN)  IV 1 g (07/09/20 2239)  . diltiazem (CARDIZEM) infusion       LOS: 3 days    Time spent: 35 mins    Kathie Dike, MD [facility]   If 7PM-7AM, please contact night-coverage [email or web address]  07/10/2020, 6:53 PM

## 2020-07-10 NOTE — Plan of Care (Signed)

## 2020-07-10 NOTE — Progress Notes (Signed)
Physical Therapy Treatment and Discharge Patient Details Name: [name] MRN: [id number] DOB: 12/28/1955 Today's Date: 07/10/2020    History of Present Illness [name] is a 65 y.o. female  presented to the ED for evaluation of shortness of breath. Admitted for Acute hypoxemic respiratory failure secondary to acute COPD exacerbation. PMH significant of laryngeal cancer status post laryngectomy, trach dependent, non-small cell lung cancer status post SBRT, COPD, insulin-dependent type 2 diabetes, diabetic neuropathy, left BKA, fibromyalgia/chronic pain, hypertension, hyperlipidemia.    PT Comments    Pt progressing towards goals. Overall at a supervision level with transfers this session. No physical assist required. Pt reports being at her baseline with transfers. Plan is to get new WC at dc as her brakes are not working well on her current WC. Educated about performing transfers to [facility] with nursing staff to maintain strength and independence.  No further acute PT needs at this time. Will sign off. If needs change, please re-consult.   Follow Up Recommendations  No PT follow up     Equipment Recommendations  Wheelchair (measurements PT);Wheelchair cushion (measurements PT);Other (comment) (tub bench)    Recommendations for Other Services       Precautions / Restrictions Precautions Precautions: Fall Precaution Comments: hx Lt BKA Restrictions Weight Bearing Restrictions: No    Mobility  Bed Mobility               General bed mobility comments: sitting EOB    Transfers Overall transfer level: Needs assistance Equipment used: None Transfers: Lateral/Scoot Transfers          Lateral/Scoot Transfers: Supervision General transfer comment: Supervision for safety and line management. Scooted towards her right. Required assist to block WC as brakes were loose. Plans to get another at d/c. Reports she is baseline.  Ambulation/Gait                 Nutritional therapist mobility: Yes Wheelchair propulsion: Both upper extremities;Right lower extremity Wheelchair parts: Independent Wheelchair Assistance Details (indicate cue type and reason): Able to negotiate [facility] in room without assist. Declined hallway WC mobility.  Modified Rankin (Stroke Patients Only)       Balance Overall balance assessment: Mild deficits observed, not formally tested                                          Cognition Arousal/Alertness: Awake/alert Behavior During Therapy: WFL for tasks assessed/performed Overall Cognitive Status: Within Functional Limits for tasks assessed                                        Exercises      General Comments        Pertinent Vitals/Pain Pain Assessment: No/denies pain    Home Living                      Prior Function            PT Goals (current goals can now be found in the care plan section) Acute Rehab PT Goals Patient Stated Goal: to go home PT Goal Formulation: With patient Time For Goal Achievement: 07/10/20 Potential to Achieve Goals: Good Progress towards PT  goals: Goals met/education completed, patient discharged from PT    Frequency    Min 3X/week      PT Plan Equipment recommendations need to be updated    Co-evaluation              AM-PAC PT "6 Clicks" Mobility   Outcome Measure  Help needed turning from your back to your side while in a flat bed without using bedrails?: None Help needed moving from lying on your back to sitting on the side of a flat bed without using bedrails?: None Help needed moving to and from a bed to a chair (including a wheelchair)?: None Help needed standing up from a chair using your arms (e.g., wheelchair or bedside chair)?: Total Help needed to walk in hospital room?: Total Help needed climbing 3-5 steps with a railing? : Total 6 Click Score: 15    End of  Session Equipment Utilized During Treatment: Oxygen Activity Tolerance: Patient tolerated treatment well Patient left: in chair;with call bell/phone within reach (in [facility]) Nurse Communication: Mobility status (pt in WC) PT Visit Diagnosis: Muscle weakness (generalized) (M62.81);Difficulty in walking, not elsewhere classified (R26.2);Pain Pain - Right/Left: Right Pain - part of body: Leg     Time: 7482-7078 PT Time Calculation (min) (ACUTE ONLY): 14 min  Charges:  $Therapeutic Activity: 8-22 mins                     Lou Miner, DPT  Acute Rehabilitation Services  Pager: [phone] Office: [phone]    Rudean Hitt 07/10/2020, 12:55 PM

## 2020-07-10 NOTE — Progress Notes (Incomplete)
  Echocardiogram 2D Echocardiogram has been performed.  Cammy Brochure 07/10/2020, 8:37 AM

## 2020-07-11 DIAGNOSIS — I4891 Unspecified atrial fibrillation: Secondary | ICD-10-CM | POA: Diagnosis not present

## 2020-07-11 DIAGNOSIS — I480 Paroxysmal atrial fibrillation: Secondary | ICD-10-CM | POA: Diagnosis not present

## 2020-07-11 DIAGNOSIS — Z930 Tracheostomy status: Secondary | ICD-10-CM

## 2020-07-11 LAB — RENAL FUNCTION PANEL
Albumin: 3.5 g/dL (ref 3.5–5.0)
Anion gap: 11 (ref 5–15)
BUN: 51 mg/dL — ABNORMAL HIGH (ref 8–23)
CO2: 29 mmol/L (ref 22–32)
Calcium: 9.3 mg/dL (ref 8.9–10.3)
Chloride: 96 mmol/L — ABNORMAL LOW (ref 98–111)
Creatinine, Ser: 1.43 mg/dL — ABNORMAL HIGH (ref 0.44–1.00)
GFR, Estimated: 41 mL/min — ABNORMAL LOW (ref >60)
Glucose, Bld: 337 mg/dL — ABNORMAL HIGH (ref 70–99)
Phosphorus: 5.6 mg/dL — ABNORMAL HIGH (ref 2.5–4.6)
Potassium: 4.1 mmol/L (ref 3.5–5.1)
Sodium: 136 mmol/L (ref 135–145)

## 2020-07-11 LAB — CBC
HCT: 44.7 % (ref 36.0–46.0)
Hemoglobin: 15.2 g/dL — ABNORMAL HIGH (ref 12.0–15.0)
MCH: 29.3 pg (ref 26.0–34.0)
MCHC: 34 g/dL (ref 30.0–36.0)
MCV: 86.3 fL (ref 80.0–100.0)
Platelets: 244 10*3/uL (ref 150–400)
RBC: 5.18 MIL/uL — ABNORMAL HIGH (ref 3.87–5.11)
RDW: 12.4 % (ref 11.5–15.5)
WBC: 7.9 10*3/uL (ref 4.0–10.5)
nRBC: 0 % (ref 0.0–0.2)

## 2020-07-11 LAB — GLUCOSE, CAPILLARY
Glucose-Capillary: 233 mg/dL — ABNORMAL HIGH (ref 70–99)
Glucose-Capillary: 312 mg/dL — ABNORMAL HIGH (ref 70–99)
Glucose-Capillary: 328 mg/dL — ABNORMAL HIGH (ref 70–99)
Glucose-Capillary: 324 mg/dL — ABNORMAL HIGH (ref 70–99)
Glucose-Capillary: 377 mg/dL — ABNORMAL HIGH (ref 70–99)

## 2020-07-11 MED ORDER — DILTIAZEM HCL 60 MG PO TABS
60.0000 mg | ORAL_TABLET | Freq: Four times a day (QID) | ORAL | Status: DC
  Administered 2020-07-11 – 2020-07-12 (×5): 60 mg via ORAL
  Filled 2020-07-11 (×5): qty 1

## 2020-07-11 MED ORDER — PREDNISONE 20 MG PO TABS
40.0000 mg | ORAL_TABLET | Freq: Every day | ORAL | Status: DC
  Administered 2020-07-12: 40 mg via ORAL
  Filled 2020-07-11: qty 2

## 2020-07-11 NOTE — Progress Notes (Signed)
PROGRESS NOTE    [name]  ATF:[id number] DOB: Sep 19, 1955 DOA: 07/07/2020 PCP: Kerin Perna, NP    Brief Narrative:  65 y/o female with history of laryngeal cancer s/p laryngectomy, trach dependent, copd, DM, presents to ED with complaints of shortness of breath. Found to have COPD exacerbation and admitted for further treatments. Hospital course complicated by development of A fib. She has been started on rate control and anticoagulation   Assessment & Plan:   Principal Problem:   COPD exacerbation ([place]) Active Problems:   Acute hypoxemic respiratory failure (HCC)   Type 2 diabetes mellitus ([place])   Hypertensive urgency   Hyperlipidemia   Pressure injury of skin   Acute respiratory failure with hypoxia -Secondary to COPD exacerbation -Normally is not on any oxygen at home -Oxygen saturations are noted in the upper 80s on room air on arrival -currently requiring 28% trach collar -D-dimer mildly elevated, VQ scan negative for PE -Hopeful to take her off the oxygen by tomorrow  COPD exacerbation -wheezing improving -change solumedrol to prednisone, continue bronchodilators and antibiotics  New onset A fib with RVR -suspect that she may have a history of paroxysmal A fib, since she has described intermittent episodes of lightheaded, similar to what she is experiencing now -Initially started on metoprolol for rate control, although rates remained in 130s -She was started on cardizem infusion at 10mg /hr with improved rates -since she still has some wheezing, will discontinue metoprolol -start on oral cardizem with hopes of discontinuing the infusion -TSH normal -Echo shows normal ejection fraction, although does comment on dilated left atrium -CHADSVASC at least 3 -Anticoagulated with apixaban  Hypertension -chronically on amlodipine hydrochlorothiazide -Blood pressure currently stable  History of laryngeal cancer status post laryngectomy, trach  dependent -Continue trach management per RT  Type 2 diabetes, insulin-dependent, uncontrolled with hyperglycemia -A1c of 8.3 -Blood sugars elevated in the setting of steroids -Change sliding scale to resistant -Continue Lantus -anticipate blood sugars should improve as steroids are tapered  Diabetic neuropathy -Continue Lyrica  Chronic pain syndrome -Continue on Suboxone  Hyperlipidemia -Continue on Lipitor  Pressure injury Pressure Injury 07/08/20 Foot Anterior;Right Stage 2 -  Partial thickness loss of dermis presenting as a shallow open injury with a red, pink wound bed without slough. (Active)  07/08/20 0000  Location: Foot  Location Orientation: Anterior;Right  Staging: Stage 2 -  Partial thickness loss of dermis presenting as a shallow open injury with a red, pink wound bed without slough.  Wound Description (Comments):   Present on Admission: Yes  continue supportive care per nursing   DVT prophylaxis: apixaban apixaban (ELIQUIS) tablet 5 mg  Code Status: full code Family Communication: discussed with patient Disposition Plan: Status is: Inpatient  Remains inpatient appropriate because:Ongoing diagnostic testing needed not appropriate for outpatient work up, IV treatments appropriate due to intensity of illness or inability to take PO and Inpatient level of care appropriate due to severity of illness   Dispo: The patient is from: Home              Anticipated d/c is to: Home              Patient currently is not medically stable to d/c.   Difficult to place patient No   Consultants:     Procedures:   Echocardiogram  Antimicrobials:   Ceftriaxone 5/15>   Subjective: She is feeling better. Wheezing improving. She does not have palpitations today. She is currently on cardizem infusion.   Objective:  Vitals:   07/11/20 1135 07/11/20 1157 07/11/20 1512 07/11/20 1524  BP: 120/74   (!) 113/55  Pulse:  87 94 77  Resp: 20 18 18 16   Temp: 98.7 F  (37.1 C)   98.7 F (37.1 C)  TempSrc: Oral   Oral  SpO2: 96% 97% 95% 100%  Weight:      Height:        Intake/Output Summary (Last 24 hours) at 07/11/2020 1753 Last data filed at 07/11/2020 0531 Gross per 24 hour  Intake 899.05 ml  Output 1475 ml  Net -575.95 ml   Filed Weights   07/07/20 1621 07/08/20 0003  Weight: 102 kg 125.5 kg    Examination:  General exam: Alert, awake, oriented x 3 Respiratory system: bilateral wheezes. Respiratory effort normal. Tracheostomy in place Cardiovascular system:RRR. No murmurs, rubs, gallops. Gastrointestinal system: Abdomen is nondistended, soft and nontender. No organomegaly or masses felt. Normal bowel sounds heard. Central nervous system: Alert and oriented. No focal neurological deficits. Extremities: left bka, RLE edema with venous stasis Skin: No rashes, lesions or ulcers Psychiatry: Judgement and insight appear normal. Mood & affect appropriate.      Data Reviewed: I have personally reviewed following labs and imaging studies  CBC: Recent Labs  Lab 07/07/20 1650 07/09/20 0950 07/11/20 0346  WBC 7.1 11.0* 7.9  NEUTROABS 5.1  --   --   HGB 14.6 15.3* 15.2*  HCT 44.6 45.9 44.7  MCV 87.6 86.6 86.3  PLT 215 272 465   Basic Metabolic Panel: Recent Labs  Lab 07/07/20 1650 07/09/20 0950 07/11/20 0346  NA 141 135 136  K 3.9 3.9 4.1  CL 104 97* 96*  CO2 30 28 29   GLUCOSE 206* 376* 337*  BUN 16 35* 51*  CREATININE 1.11* 1.34* 1.43*  CALCIUM 8.8* 9.4 9.3  PHOS  --   --  5.6*   GFR: Estimated Creatinine Clearance: 55.3 mL/min (A) (by C-G formula based on SCr of 1.43 mg/dL (H)). Liver Function Tests: Recent Labs  Lab 07/11/20 0346  ALBUMIN 3.5   No results for input(s): LIPASE, AMYLASE in the last 168 hours. No results for input(s): AMMONIA in the last 168 hours. Coagulation Profile: No results for input(s): INR, PROTIME in the last 168 hours. Cardiac Enzymes: No results for input(s): CKTOTAL, CKMB, CKMBINDEX,  TROPONINI in the last 168 hours. BNP (last 3 results) No results for input(s): PROBNP in the last 8760 hours. HbA1C: No results for input(s): HGBA1C in the last 72 hours. CBG: Recent Labs  Lab 07/10/20 1600 07/10/20 2119 07/11/20 0720 07/11/20 1129 07/11/20 1521  GLUCAP 233* 308* 312* 328* 324*   Lipid Profile: No results for input(s): CHOL, HDL, LDLCALC, TRIG, CHOLHDL, LDLDIRECT in the last 72 hours. Thyroid Function Tests: Recent Labs    07/09/20 0950  TSH 1.265   Anemia Panel: No results for input(s): VITAMINB12, FOLATE, FERRITIN, TIBC, IRON, RETICCTPCT in the last 72 hours. Sepsis Labs: No results for input(s): PROCALCITON, LATICACIDVEN in the last 168 hours.  Recent Results (from the past 240 hour(s))  Resp Panel by RT-PCR (Flu A&B, Covid) Nasopharyngeal Swab     Status: None   Collection Time: 07/07/20  7:28 PM   Specimen: Nasopharyngeal Swab; Nasopharyngeal(NP) swabs in vial transport medium  Result Value Ref Range Status   SARS Coronavirus 2 by RT PCR NEGATIVE NEGATIVE Final    Comment: (NOTE) SARS-CoV-2 target nucleic acids are NOT DETECTED.  The SARS-CoV-2 RNA is generally detectable in upper respiratory specimens during the  acute phase of infection. The lowest concentration of SARS-CoV-2 viral copies this assay can detect is 138 copies/mL. A negative result does not preclude SARS-Cov-2 infection and should not be used as the sole basis for treatment or other patient management decisions. A negative result may occur with  improper specimen collection/handling, submission of specimen other than nasopharyngeal swab, presence of viral mutation(s) within the areas targeted by this assay, and inadequate number of viral copies(<138 copies/mL). A negative result must be combined with clinical observations, patient history, and epidemiological information. The expected result is Negative.  Fact Sheet for Patients:  EntrepreneurPulse.com.au  Fact  Sheet for Healthcare Providers:  IncredibleEmployment.be  This test is no t yet approved or cleared by the [place] FDA and  has been authorized for detection and/or diagnosis of SARS-CoV-2 by FDA under an Emergency Use Authorization (EUA). This EUA will remain  in effect (meaning this test can be used) for the duration of the COVID-19 declaration under Section 564(b)(1) of the Act, 21 U.S.C.section 360bbb-3(b)(1), unless the authorization is terminated  or revoked sooner.       Influenza A by PCR NEGATIVE NEGATIVE Final   Influenza B by PCR NEGATIVE NEGATIVE Final    Comment: (NOTE) The Xpert Xpress SARS-CoV-2/FLU/RSV plus assay is intended as an aid in the diagnosis of influenza from Nasopharyngeal swab specimens and should not be used as a sole basis for treatment. Nasal washings and aspirates are unacceptable for Xpert Xpress SARS-CoV-2/FLU/RSV testing.  Fact Sheet for Patients: EntrepreneurPulse.com.au  Fact Sheet for Healthcare Providers: IncredibleEmployment.be  This test is not yet approved or cleared by the [place] FDA and has been authorized for detection and/or diagnosis of SARS-CoV-2 by FDA under an Emergency Use Authorization (EUA). This EUA will remain in effect (meaning this test can be used) for the duration of the COVID-19 declaration under Section 564(b)(1) of the Act, 21 U.S.C. section 360bbb-3(b)(1), unless the authorization is terminated or revoked.  Performed at [facility], [place] [address], [place], [place] [address]          Radiology Studies: ECHOCARDIOGRAM COMPLETE  Result Date: 07/10/2020    ECHOCARDIOGRAM REPORT   Patient Name:   [name] [facility] Date of Exam: 07/10/2020 Medical Rec #:  [id number]    Height:       66.0 in Accession #:    [id number]   Weight:       276.7 lb Date of Birth:  09-23-1955    BSA:          2.296 m Patient Age:    40 years     BP:           104/76 mmHg  Patient Gender: F            HR:           78 bpm. Exam Location:  Inpatient Procedure: 2D Echo, Cardiac Doppler and Color Doppler Indications:    Atrial Fibrillation  History:        Patient has no prior history of Echocardiogram examinations.                 COPD; Risk Factors:Hypertension and Diabetes.  Sonographer:    Cammy Brochure Referring Phys: [phone] [facility] Ryleigh Buenger  Sonographer Comments: Image acquisition challenging due to patient body habitus and Image acquisition challenging due to COPD. IMPRESSIONS  1. Left ventricular ejection fraction, by estimation, is 65 to 70%. The left ventricle has normal function. The left ventricle has no  regional wall motion abnormalities. There is mild left ventricular hypertrophy. Left ventricular diastolic parameters are indeterminate.  2. Right ventricular systolic function is normal. The right ventricular size is normal.  3. Left atrial size was severely dilated.  4. No evidence of mitral valve regurgitation.  5. The aortic valve is tricuspid. Aortic valve regurgitation is not visualized. Mild aortic valve sclerosis is present, with no evidence of aortic valve stenosis.  6. The inferior vena cava is normal in size with greater than 50% respiratory variability, suggesting right atrial pressure of 3 mmHg. FINDINGS  Left Ventricle: Left ventricular ejection fraction, by estimation, is 65 to 70%. The left ventricle has normal function. The left ventricle has no regional wall motion abnormalities. The left ventricular internal cavity size was normal in size. There is  mild left ventricular hypertrophy. Left ventricular diastolic parameters are indeterminate. Right Ventricle: The right ventricular size is normal. No increase in right ventricular wall thickness. Right ventricular systolic function is normal. Left Atrium: Left atrial size was severely dilated. Right Atrium: Right atrial size was normal in size. Pericardium: There is no evidence of pericardial effusion. Mitral  Valve: Mild mitral annular calcification. No evidence of mitral valve regurgitation. Tricuspid Valve: The tricuspid valve is normal in structure. Tricuspid valve regurgitation is trivial. Aortic Valve: The aortic valve is tricuspid. Aortic valve regurgitation is not visualized. Mild aortic valve sclerosis is present, with no evidence of aortic valve stenosis. Aortic valve mean gradient measures 3.2 mmHg. Aortic valve peak gradient measures 5.7 mmHg. Aortic valve area, by VTI measures 3.72 cm. Pulmonic Valve: The pulmonic valve was not well visualized. Pulmonic valve regurgitation is not visualized. Aorta: The aortic root is normal in size and structure. Venous: The inferior vena cava is normal in size with greater than 50% respiratory variability, suggesting right atrial pressure of 3 mmHg. IAS/Shunts: No atrial level shunt detected by color flow Doppler.  LEFT VENTRICLE PLAX 2D LVIDd:         4.00 cm LVIDs:         2.60 cm LV PW:         1.40 cm LV IVS:        1.40 cm LVOT diam:     2.20 cm LV SV:         71 LV SV Index:   31 LVOT Area:     3.80 cm  RIGHT VENTRICLE             IVC RV S prime:     11.90 cm/s  IVC diam: 1.20 cm TAPSE (M-mode): 2.4 cm LEFT ATRIUM              Index       RIGHT ATRIUM           Index LA diam:        4.10 cm  1.79 cm/m  RA Area:     19.00 cm LA Vol (A2C):   132.0 ml 57.50 ml/m RA Volume:   51.70 ml  22.52 ml/m LA Vol (A4C):   163.0 ml 71.01 ml/m LA Biplane Vol: 150.0 ml 65.34 ml/m  AORTIC VALVE AV Area (Vmax):    3.19 cm AV Area (Vmean):   3.36 cm AV Area (VTI):     3.72 cm AV Vmax:           118.90 cm/s AV Vmean:          81.450 cm/s AV VTI:  0.190 m AV Peak Grad:      5.7 mmHg AV Mean Grad:      3.2 mmHg LVOT Vmax:         99.80 cm/s LVOT Vmean:        72.033 cm/s LVOT VTI:          0.186 m LVOT/AV VTI ratio: 0.98  AORTA Ao Root diam: 3.20 cm Ao Asc diam:  3.90 cm MITRAL VALVE MV Area (PHT): 3.60 cm     SHUNTS MV Decel Time: 211 msec     Systemic VTI:  0.19 m MV  E velocity: 148.00 cm/s  Systemic Diam: 2.20 cm Dorris Carnes MD Electronically signed by Dorris Carnes MD Signature Date/Time: 07/10/2020/2:34:43 PM    Final         Scheduled Meds: . apixaban  5 mg Oral BID  . atorvastatin  20 mg Oral Daily  . budesonide (PULMICORT) nebulizer solution  0.25 mg Nebulization BID  . buprenorphine-naloxone  1 tablet Sublingual TID  . cyclobenzaprine  10 mg Oral BID  . hydrochlorothiazide  25 mg Oral Daily  . insulin aspart  0-20 Units Subcutaneous TID WC  . insulin aspart  0-5 Units Subcutaneous QHS  . insulin aspart  6 Units Subcutaneous TID WC  . insulin glargine  40 Units Subcutaneous Daily  . ipratropium-albuterol  3 mL Nebulization BID  . methylPREDNISolone (SOLU-MEDROL) injection  60 mg Intravenous BID  . metoprolol tartrate  25 mg Oral BID  . pregabalin  75 mg Oral BID  . Ensure Max Protein  11 oz Oral BID   Continuous Infusions: . cefTRIAXone (ROCEPHIN)  IV 1 g (07/10/20 2252)  . diltiazem (CARDIZEM) infusion 10 mg/hr (07/11/20 0954)     LOS: 4 days    Time spent: 41 mins    Kathie Dike, MD [facility]   If 7PM-7AM, please contact night-coverage [email or web address]  07/11/2020, 5:53 PM

## 2020-07-11 NOTE — Progress Notes (Signed)
Per RN and patient, requesting patient to come off trach collar at this time. Patient currently on rm air, VSS. RT will continue to monitor. Trach collar still in rm if patient needs.

## 2020-07-11 NOTE — Plan of Care (Signed)

## 2020-07-11 NOTE — Progress Notes (Signed)
Inpatient Diabetes Program Recommendations  AACE/ADA: New Consensus Statement on Inpatient Glycemic Control (2015)  Target Ranges:  Prepandial:   less than 140 mg/dL      Peak postprandial:   less than 180 mg/dL (1-2 hours)      Critically ill patients:  140 - 180 mg/dL   Lab Results  Component Value Date   GLUCAP 312 (H) 07/11/2020   HGBA1C 8.3 (H) 07/08/2020    Review of Glycemic Control Results for [name], [name] (MRN [id number]) as of 07/11/2020 10:50  Ref. Range 07/10/2020 07:42 07/10/2020 12:02 07/10/2020 16:00 07/10/2020 21:19 07/11/2020 07:20  Glucose-Capillary Latest Ref Range: 70 - 99 mg/dL 222 (H)  Novolog 13 units  Lantus 40 units 171 (H)  Novolog 10 units 233 (H)  Novolog 13 units 308 (H)  Novolog 4 units 312 (H)  Novolog 21 units  Lantus 40 units   Diabetes history: DM 2 Outpatient Diabetes medications:  Current orders for Inpatient glycemic control:  Lantus 40 Daily Novolog 0-20 units tid + hs Novolog 6 units tid meal coverage  Solumedrol 60 mg bid  Inpatient Diabetes Program Recommendations:    If steroids remain at current dose  -  Increase Lantus to 50 units -  Increase Novolog meal coverage to 10 units tid  Thanks,  Tama Headings RN, MSN, BC-ADM Inpatient Diabetes Coordinator Team Pager [phone] (8a-5p)

## 2020-07-12 ENCOUNTER — Other Ambulatory Visit (HOSPITAL_COMMUNITY): Payer: Self-pay

## 2020-07-12 DIAGNOSIS — I4891 Unspecified atrial fibrillation: Secondary | ICD-10-CM

## 2020-07-12 LAB — BASIC METABOLIC PANEL
Anion gap: 8 (ref 5–15)
BUN: 49 mg/dL — ABNORMAL HIGH (ref 8–23)
CO2: 33 mmol/L — ABNORMAL HIGH (ref 22–32)
Calcium: 9.5 mg/dL (ref 8.9–10.3)
Chloride: 95 mmol/L — ABNORMAL LOW (ref 98–111)
Creatinine, Ser: 1.41 mg/dL — ABNORMAL HIGH (ref 0.44–1.00)
GFR, Estimated: 42 mL/min — ABNORMAL LOW (ref >60)
Glucose, Bld: 285 mg/dL — ABNORMAL HIGH (ref 70–99)
Potassium: 4 mmol/L (ref 3.5–5.1)
Sodium: 136 mmol/L (ref 135–145)

## 2020-07-12 LAB — CBC
HCT: 47.3 % — ABNORMAL HIGH (ref 36.0–46.0)
Hemoglobin: 15.7 g/dL — ABNORMAL HIGH (ref 12.0–15.0)
MCH: 28.8 pg (ref 26.0–34.0)
MCHC: 33.2 g/dL (ref 30.0–36.0)
MCV: 86.6 fL (ref 80.0–100.0)
Platelets: 264 10*3/uL (ref 150–400)
RBC: 5.46 MIL/uL — ABNORMAL HIGH (ref 3.87–5.11)
RDW: 12.3 % (ref 11.5–15.5)
WBC: 9.1 10*3/uL (ref 4.0–10.5)
nRBC: 0 % (ref 0.0–0.2)

## 2020-07-12 LAB — GLUCOSE, CAPILLARY
Glucose-Capillary: 291 mg/dL — ABNORMAL HIGH (ref 70–99)
Glucose-Capillary: 281 mg/dL — ABNORMAL HIGH (ref 70–99)
Glucose-Capillary: 225 mg/dL — ABNORMAL HIGH (ref 70–99)

## 2020-07-12 MED ORDER — APIXABAN 5 MG PO TABS
5.0000 mg | ORAL_TABLET | Freq: Two times a day (BID) | ORAL | 0 refills | Status: DC
  Filled 2020-07-12: qty 60, 30d supply, fill #0

## 2020-07-12 MED ORDER — DILTIAZEM HCL ER COATED BEADS 240 MG PO CP24
240.0000 mg | ORAL_CAPSULE | Freq: Every day | ORAL | 0 refills | Status: DC
  Filled 2020-07-12: qty 30, 30d supply, fill #0

## 2020-07-12 MED ORDER — PREDNISONE 10 MG PO TABS
ORAL_TABLET | ORAL | 0 refills | Status: AC
  Filled 2020-07-12: qty 22, 11d supply, fill #0

## 2020-07-12 MED ORDER — INSULIN ASPART 100 UNIT/ML FLEXPEN: 10.0000 [IU] | PEN_INJECTOR | Freq: Three times a day (TID) | SUBCUTANEOUS | 0 refills | Status: AC

## 2020-07-12 MED ORDER — LANTUS SOLOSTAR 100 UNIT/ML ~~LOC~~ SOPN
45.0000 [IU] | PEN_INJECTOR | Freq: Every day | SUBCUTANEOUS | 0 refills | Status: DC
Start: 2020-07-12 — End: 2023-08-04

## 2020-07-12 MED ORDER — DILTIAZEM HCL 60 MG PO TABS
60.0000 mg | ORAL_TABLET | Freq: Four times a day (QID) | ORAL | 0 refills | Status: DC
Start: 2020-07-12 — End: 2021-07-06
  Filled 2020-07-12: qty 2, 1d supply, fill #0

## 2020-07-12 MED ORDER — ALUM & MAG HYDROXIDE-SIMETH 200-200-20 MG/5ML PO SUSP
30.0000 mL | ORAL | Status: DC | PRN
  Administered 2020-07-12: 30 mL via ORAL
  Filled 2020-07-12: qty 30

## 2020-07-12 NOTE — Progress Notes (Signed)
Inpatient Diabetes Program Recommendations  AACE/ADA: New Consensus Statement on Inpatient Glycemic Control (2015)  Target Ranges:  Prepandial:   less than 140 mg/dL      Peak postprandial:   less than 180 mg/dL (1-2 hours)      Critically ill patients:  140 - 180 mg/dL   Lab Results  Component Value Date   GLUCAP 291 (H) 07/12/2020   HGBA1C 8.3 (H) 07/08/2020    Review of Glycemic Control Results for [name], [name] (MRN [id number]) as of 07/11/2020 10:50  Ref. Range 07/10/2020 07:42 07/10/2020 12:02 07/10/2020 16:00 07/10/2020 21:19 07/11/2020 07:20  Glucose-Capillary Latest Ref Range: 70 - 99 mg/dL 222 (H)  Novolog 13 units  Lantus 40 units 171 (H)  Novolog 10 units 233 (H)  Novolog 13 units 308 (H)  Novolog 4 units 312 (H)  Novolog 21 units  Lantus 40 units  Results for [name], [name] (MRN [id number]) as of 07/12/2020 11:03  Ref. Range 07/11/2020 07:20 07/11/2020 11:29 07/11/2020 15:21 07/11/2020 19:51 07/12/2020 07:46  Glucose-Capillary Latest Ref Range: 70 - 99 mg/dL 312 (H) 328 (H) 324 (H) 377 (H) 291 (H)   Diabetes history: DM 2 Outpatient Diabetes medications:  Current orders for Inpatient glycemic control:  Lantus 40 Daily Novolog 0-20 units tid + hs Novolog 6 units tid meal coverage  Solumedrol 60 mg bid  Inpatient Diabetes Program Recommendations:    If steroids remain at current dose  -  Increase Lantus to 50 units -  Increase Novolog meal coverage to 10 units tid  Thanks,  Tama Headings RN, MSN, BC-ADM Inpatient Diabetes Coordinator Team Pager [phone] (8a-5p)

## 2020-07-12 NOTE — Progress Notes (Signed)
Inpatient Diabetes Program Recommendations  AACE/ADA: New Consensus Statement on Inpatient Glycemic Control (2015)  Target Ranges:  Prepandial:   less than 140 mg/dL      Peak postprandial:   less than 180 mg/dL (1-2 hours)      Critically ill patients:  140 - 180 mg/dL   Lab Results  Component Value Date   GLUCAP 291 (H) 07/12/2020   HGBA1C 8.3 (H) 07/08/2020    Review of Glycemic Control Results for [name], [name] (MRN [id number]) as of 07/11/2020 10:50  Ref. Range 07/10/2020 07:42 07/10/2020 12:02 07/10/2020 16:00 07/10/2020 21:19 07/11/2020 07:20  Glucose-Capillary Latest Ref Range: 70 - 99 mg/dL 222 (H)  Novolog 13 units  Lantus 40 units 171 (H)  Novolog 10 units 233 (H)  Novolog 13 units 308 (H)  Novolog 4 units 312 (H)  Novolog 21 units  Lantus 40 units  Results for [name], [name] (MRN [id number]) as of 07/12/2020 11:03  Ref. Range 07/11/2020 07:20 07/11/2020 11:29 07/11/2020 15:21 07/11/2020 19:51 07/12/2020 07:46  Glucose-Capillary Latest Ref Range: 70 - 99 mg/dL 312 (H) 328 (H) 324 (H) 377 (H) 291 (H)   Diabetes history: DM 2 Outpatient Diabetes medications:  Current orders for Inpatient glycemic control:  Lantus 40 Daily Novolog 0-20 units tid + hs Novolog 6 units tid meal coverage  Solumedrol 60 mg bid, now PO Prednisone 40 mg Daily  Inpatient Diabetes Program Recommendations:    If steroids remain at current dose  -  Increase Lantus to 45-50 units -  Increase Novolog meal coverage to 10 units tid  Thanks,  Tama Headings RN, MSN, BC-ADM Inpatient Diabetes Coordinator Team Pager [phone] (8a-5p)

## 2020-07-12 NOTE — Plan of Care (Signed)

## 2020-07-12 NOTE — Discharge Summary (Signed)
Physician Discharge Summary  [name] IPJ:[id number] DOB: Aug 14, 1955 DOA: 07/07/2020  PCP: Kerin Perna, NP  Admit date: 07/07/2020 Discharge date: 07/12/2020  Admitted From: Home Disposition:  Home  Recommendations for Outpatient Follow-up:  1. Follow up with PCP in 1-2 weeks   Discharge Condition:Improved CODE STATUS:Full Diet recommendation: Diabetic, heart healthy   Brief/Interim Summary: 65 y/o female with history of laryngeal cancer s/p laryngectomy, trach dependent, copd, DM, presents to ED with complaints of shortness of breath. Found to have COPD exacerbation and admitted for further treatments. Hospital course complicated by development of A fib. She has been started on rate control and anticoagulation  Discharge Diagnoses:  Principal Problem:   COPD exacerbation ([place]) Active Problems:   Acute hypoxemic respiratory failure (HCC)   Type 2 diabetes mellitus (HCC)   Hypertensive urgency   Hyperlipidemia   Pressure injury of skin   Atrial fibrillation with RVR (HCC)   Tracheostomy dependence (HCC)  Acute respiratory failure with hypoxia -Secondary to COPD exacerbation -Normally is not on any oxygen at home -Oxygen saturations are noted in the upper 80s on room air on arrival -D-dimer mildly elevated, VQ scan negative for PE -successfully weaned to room air by time of d/c  COPD exacerbation -wheezing improving -had been on steroids, continue bronchodilators and antibiotics  New onset A fib with RVR -suspect that she may have a history of paroxysmal A fib, since she has described intermittent episodes of lightheaded, similar to what she is experiencing now -Initially started on metoprolol for rate control, although rates remained in 130s -She was started on cardizem infusion at 35m/hr with improved rates -given copd exacerbation, metoprolol was d/c'dl -Have since transitioned to oral cardizem at time of d/c -TSH normal -Echo shows normal ejection  fraction, although does comment on dilated left atrium -CHADSVASC at least 3 -Anticoagulated with apixaban  Hypertension -chronically on amlodipine hydrochlorothiazide -Blood pressure currently stable  History of laryngeal cancer status post laryngectomy, trach dependent -Continue trach management per RT  Type 2 diabetes, insulin-dependent, uncontrolled with hyperglycemia -A1c of 8.3 -Blood sugars elevated in the setting of steroids -Change sliding scale to resistant -Continue Lantus -anticipate blood sugars should improve as steroids are tapered  Diabetic neuropathy -Continue Lyrica  Chronic pain syndrome -Continue on Suboxone  Hyperlipidemia -Continue on Lipitor  Pressure injury Pressure Injury 07/08/20 Foot Anterior;Right Stage 2 -  Partial thickness loss of dermis presenting as a shallow open injury with a red, pink wound bed without slough. (Active)  07/08/20 0000  Location: Foot  Location Orientation: Anterior;Right  Staging: Stage 2 -  Partial thickness loss of dermis presenting as a shallow open injury with a red, pink wound bed without slough.  Wound Description (Comments):   Present on Admission: Yes  continue supportive care per nursing    Discharge Instructions   Allergies as of 07/12/2020      Reactions   Ace Inhibitors Anaphylaxis, Swelling   Angioedema   Zestril [lisinopril] Anaphylaxis, Swelling   Angioedema      Medication List    STOP taking these medications   amLODipine 10 MG tablet Commonly known as: NORVASC   azithromycin 250 MG tablet Commonly known as: ZITHROMAX   ibuprofen 200 MG tablet Commonly known as: ADVIL   ibuprofen 800 MG tablet Commonly known as: ADVIL     TAKE these medications   albuterol 108 (90 Base) MCG/ACT inhaler Commonly known as: VENTOLIN HFA Inhale 2 puffs into the lungs every 4 (four) hours as needed for  wheezing or shortness of breath.   apixaban 5 MG Tabs tablet Commonly known as:  ELIQUIS Take 1 tablet (5 mg total) by mouth 2 (two) times daily.   atorvastatin 20 MG tablet Commonly known as: LIPITOR Take 20 mg by mouth daily.   blood glucose meter kit and supplies Kit Dispense based on patient and insurance preference. Use up to four times daily as directed. (FOR ICD-9 250.00, 250.01).   Buprenorphine HCl-Naloxone HCl 8-2 MG Film Place 1 Film under the tongue 3 (three) times daily.   cyclobenzaprine 10 MG tablet Commonly known as: FLEXERIL Take 1 tablet by mouth every 12 (twelve) hours.   diltiazem 240 MG 24 hr tablet Commonly known as: Cardizem LA Take 1 tablet (240 mg total) by mouth daily. Start taking on: Jul 13, 2020   diltiazem 60 MG tablet Commonly known as: CARDIZEM Take 1 tablet (60 mg total) by mouth every 6 (six) hours for 2 doses.   hydrochlorothiazide 25 MG tablet Commonly known as: HYDRODIURIL Take 25 mg by mouth daily.   insulin aspart 100 UNIT/ML FlexPen Commonly known as: NOVOLOG Inject 3 Units into the skin 3 (three) times daily with meals.   insulin glargine 100 UNIT/ML Solostar Pen Commonly known as: Lantus SoloStar Inject 17 Units into the skin daily. At 10AM What changed: how much to take   Pen Needles 3/16" 31G X 5 MM Misc Use as directed.   predniSONE 10 MG tablet Commonly known as: DELTASONE Take 4 tablets (40 mg total) by mouth daily for 3 days, THEN 2 tablets (20 mg total) daily for 3 days, THEN 1 tablet (10 mg total) daily for 3 days, THEN 0.5 tablets (5 mg total) daily for 2 days. Start taking on: Jul 12, 2020   pregabalin 75 MG capsule Commonly known as: LYRICA Take 75 mg by mouth 2 (two) times daily. What changed: Another medication with the same name was removed. Continue taking this medication, and follow the directions you see here.   Stiolto Respimat 2.5-2.5 MCG/ACT Aers Generic drug: Tiotropium Bromide-Olodaterol INHALE 2 PUFFS BY MOUTH INTO THE LUNGS DAILY What changed: See the new instructions.             Durable Medical Equipment  (From admission, onward)         Start     Ordered   07/08/20 1447  For home use only DME standard manual wheelchair with seat cushion  Once       Comments: Patient suffers from amputation which impairs their ability to perform daily activities like walkingin the home.  A walker will not resolve issue with performing activities of daily living. A wheelchair will allow patient to safely perform daily activities. Patient can safely propel the wheelchair in the home or has a caregiver who can provide assistance. Length of need lifetime Accessories: elevating leg rests (ELRs), wheel locks, extensions and anti-tippers.   07/08/20 1447          Follow-up Information    Kerin Perna, NP. Schedule an appointment as soon as possible for a visit in 2 week(s).   Specialty: Internal Medicine Contact information: [place] [id number] [phone]              Allergies  Allergen Reactions  . Ace Inhibitors Anaphylaxis and Swelling    Angioedema  . Zestril [Lisinopril] Anaphylaxis and Swelling    Angioedema   Procedures/Studies: DG Chest 2 View  Result Date: 07/07/2020 CLINICAL DATA:  Shortness of  breath. EXAM: CHEST - 2 VIEW COMPARISON:  Radiograph 06/16/2018 FINDINGS: Borderline cardiomegaly. Streaky left perihilar opacity favors atelectasis. No confluent consolidation. No pulmonary edema, pleural effusion, or pneumothorax. Bones are under mineralized. IMPRESSION: Borderline cardiomegaly. Streaky left perihilar opacity favors atelectasis. Electronically Signed   By: Keith Rake M.D.   On: 07/07/2020 18:36   NM Pulmonary Perfusion  Result Date: 07/10/2020 CLINICAL DATA:  Elevated D-dimer. Concern for pulmonary embolism. Wheezing. EXAM: NUCLEAR MEDICINE PERFUSION LUNG SCAN TECHNIQUE: Perfusion images were obtained in multiple projections after intravenous injection of radiopharmaceutical. RADIOPHARMACEUTICALS:  4.4  mCi Tc-31mMAA COMPARISON:  Chest radiograph 07/09/2020, CT 11/14/2017. FINDINGS: No wedge-shaped peripheral perfusion defects to localize acute pulmonary embolism. Band of lack of perfusion in the LEFT upper lobe has a linear pattern and corresponds to a band of linear pleuroparenchymal thickening on comparison radiograph and CT. IMPRESSION: No evidence acute pulmonary embolism Electronically Signed   By: SSuzy BouchardM.D.   On: 07/10/2020 08:21   DG CHEST PORT 1 VIEW  Result Date: 07/10/2020 CLINICAL DATA:  65year old female with shortness of breath. EXAM: PORTABLE CHEST - 1 VIEW COMPARISON:  07/07/2020, 11/14/2017 FINDINGS: The mediastinal contours are within normal limits. Unchanged mild cardiomegaly. Tracheostomy cannula remains in place. Similar appearing left upper lobe cicatricial atelectasis. The lungs are otherwise clear bilaterally without evidence of focal consolidation, pleural effusion, or pneumothorax. No acute osseous abnormality. IMPRESSION: 1. No acute cardiopulmonary process. 2. Tracheostomy cannula remains in place. 3. Similar-appearing left upper lobe cicatricial atelectasis. Electronically Signed   By: DRuthann CancerMD   On: 07/10/2020 08:20   ECHOCARDIOGRAM COMPLETE  Result Date: 07/10/2020    ECHOCARDIOGRAM REPORT   Patient Name:   [facility]MK[facility] Center[facility] of [facility]am: [facility]10/2020 Medical Rec #:  [id number]   Height:       66.0 in Accession #:    [id number]  Weight:       276.7 lb Date of Birth:  [birth year]   BSA:          2.296 m Patient Age:    89years     BP:           104/76 mmHg Patient Gender: F            HR:           78 bpm. Exam Location:  Inpatient Procedure: 2D Echo, Cardiac Doppler and Color Doppler Indications:    Atrial Fibrillation  History:        Patient has no prior history of Echocardiogram examinations.                 COPD; Risk Factors:Hypertension and Diabetes.  Sonographer:    JCammy BrochureReferring Phys: [phone] Eye SurgicenterMEMON  Sonographer Comments: Image  acquisition challenging due to patient body habitus and Image acquisition challenging due to COPD. IMPRESSIONS  1. Left ventricular ejection fraction, by estimation, is 65 to 70%. The left ventricle has normal function. The left ventricle has no regional wall motion abnormalities. There is mild left ventricular hypertrophy. Left ventricular diastolic parameters are indeterminate.  2. Right ventricular systolic function is normal. The right ventricular size is normal.  3. Left atrial size was severely dilated.  4. No evidence of mitral valve regurgitation.  5. The aortic valve is tricuspid. Aortic valve regurgitation is not visualized. Mild aortic valve sclerosis is present, with no evidence of aortic valve stenosis.  6. The inferior vena cava is normal in size with greater than  50% respiratory variability, suggesting right atrial pressure of 3 mmHg. FINDINGS  Left Ventricle: Left ventricular ejection fraction, by estimation, is 65 to 70%. The left ventricle has normal function. The left ventricle has no regional wall motion abnormalities. The left ventricular internal cavity size was normal in size. There is  mild left ventricular hypertrophy. Left ventricular diastolic parameters are indeterminate. Right Ventricle: The right ventricular size is normal. No increase in right ventricular wall thickness. Right ventricular systolic function is normal. Left Atrium: Left atrial size was severely dilated. Right Atrium: Right atrial size was normal in size. Pericardium: There is no evidence of pericardial effusion. Mitral Valve: Mild mitral annular calcification. No evidence of mitral valve regurgitation. Tricuspid Valve: The tricuspid valve is normal in structure. Tricuspid valve regurgitation is trivial. Aortic Valve: The aortic valve is tricuspid. Aortic valve regurgitation is not visualized. Mild aortic valve sclerosis is present, with no evidence of aortic valve stenosis. Aortic valve mean gradient measures 3.2 mmHg.  Aortic valve peak gradient measures 5.7 mmHg. Aortic valve area, by VTI measures 3.72 cm. Pulmonic Valve: The pulmonic valve was not well visualized. Pulmonic valve regurgitation is not visualized. Aorta: The aortic root is normal in size and structure. Venous: The inferior vena cava is normal in size with greater than 50% respiratory variability, suggesting right atrial pressure of 3 mmHg. IAS/Shunts: No atrial level shunt detected by color flow Doppler.  LEFT VENTRICLE PLAX 2D LVIDd:         4.00 cm LVIDs:         2.60 cm LV PW:         1.40 cm LV IVS:        1.40 cm LVOT diam:     2.20 cm LV SV:         71 LV SV Index:   31 LVOT Area:     3.80 cm  RIGHT VENTRICLE             IVC RV S prime:     11.90 cm/s  IVC diam: 1.20 cm TAPSE (M-mode): 2.4 cm LEFT ATRIUM              Index       RIGHT ATRIUM           Index LA diam:        4.10 cm  1.79 cm/m  RA Area:     19.00 cm LA Vol (A2C):   132.0 ml 57.50 ml/m RA Volume:   51.70 ml  22.52 ml/m LA Vol (A4C):   163.0 ml 71.01 ml/m LA Biplane Vol: 150.0 ml 65.34 ml/m  AORTIC VALVE AV Area (Vmax):    3.19 cm AV Area (Vmean):   3.36 cm AV Area (VTI):     3.72 cm AV Vmax:           118.90 cm/s AV Vmean:          81.450 cm/s AV VTI:            0.190 m AV Peak Grad:      5.7 mmHg AV Mean Grad:      3.2 mmHg LVOT Vmax:         99.80 cm/s LVOT Vmean:        72.033 cm/s LVOT VTI:          0.186 m LVOT/AV VTI ratio: 0.98  AORTA Ao Root diam: 3.20 cm Ao Asc diam:  3.90 cm MITRAL VALVE MV Area (PHT): 3.60 cm  SHUNTS MV Decel Time: 211 msec     Systemic VTI:  0.19 m MV E velocity: 148.00 cm/s  Systemic Diam: 2.20 cm Dorris Carnes MD Electronically signed by Dorris Carnes MD Signature Date/Time: 07/10/2020/2:34:43 PM    Final      Subjective: Eager to go home  Discharge Exam: Vitals:   07/12/20 1140 07/12/20 1454  BP:    Pulse:    Resp: 16   Temp:    SpO2: 96% 95%   Vitals:   07/12/20 1117 07/12/20 1120 07/12/20 1140 07/12/20 1454  BP:      Pulse: (!) 117      Resp: (!) _0 Temp:      TempSrc:      SpO2: 97%  96% 95%  Weight:      Height:        General: Pt is alert, awake, not in acute distress Cardiovascular: RRR, S1/S2 +, no rubs, no gallops Respiratory: CTA bilaterally, no wheezing, no rhonchi Abdominal: Soft, NT, ND, bowel sounds + Extremities: no edema, no cyanosis   The results of significant diagnostics from this hospitalization (including imaging, microbiology, ancillary and laboratory) are listed below for reference.     Microbiology: Recent Results (from the past 240 hour(s))  Resp Panel by RT-PCR (Flu A&B, Covid) Nasopharyngeal Swab     Status: None   Collection Time: 07/07/20  7:28 PM   Specimen: Nasopharyngeal Swab; Nasopharyngeal(NP) swabs in vial transport medium  Result Value Ref Range Status   SARS Coronavirus 2 by RT PCR NEGATIVE NEGATIVE Final    Comment: (NOTE) SARS-CoV-2 target nucleic acids are NOT DETECTED.  The SARS-CoV-2 RNA is generally detectable in upper respiratory specimens during the acute phase of infection. The lowest concentration of SARS-CoV-2 viral copies this assay can detect is 138 copies/mL. A negative result does not preclude SARS-Cov-2 infection and should not be used as the sole basis for treatment or other patient management decisions. A negative result may occur with  improper specimen collection/handling, submission of specimen other than nasopharyngeal swab, presence of viral mutation(s) within the areas targeted by this assay, and inadequate number of viral copies(<138 copies/mL). A negative result must be combined with clinical observations, patient history, and epidemiological information. The expected result is Negative.  Fact Sheet for Patients:  EntrepreneurPulse.com.au  Fact Sheet for Healthcare Providers:  IncredibleEmployment.be  This test is no t yet approved or cleared by the [place] FDA and  has been authorized for  detection and/or diagnosis of SARS-CoV-2 by FDA under an Emergency Use Authorization (EUA). This EUA will remain  in effect (meaning this test can be used) for the duration of the COVID-19 declaration under Section 564(b)(1) of the Act, 21 U.S.C.section 360bbb-3(b)(1), unless the authorization is terminated  or revoked sooner.       Influenza A by PCR NEGATIVE NEGATIVE Final   Influenza B by PCR NEGATIVE NEGATIVE Final    Comment: (NOTE) The Xpert Xpress SARS-CoV-2/FLU/RSV plus assay is intended as an aid in the diagnosis of influenza from Nasopharyngeal swab specimens and should not be used as a sole basis for treatment. Nasal washings and aspirates are unacceptable for Xpert Xpress SARS-CoV-2/FLU/RSV testing.  Fact Sheet for Patients: EntrepreneurPulse.com.au  Fact Sheet for Healthcare Providers: IncredibleEmployment.be  This test is not yet approved or cleared by the [place] FDA and has been authorized for detection and/or diagnosis of SARS-CoV-2 by FDA under an Emergency Use Authorization (EUA). This EUA will remain in effect (meaning  this test can be used) for the duration of the COVID-19 declaration under Section 564(b)(1) of the Act, 21 U.S.C. section 360bbb-3(b)(1), unless the authorization is terminated or revoked.  Performed at [facility], [place] [address], [place], [place] [address]      Labs: BNP (last 3 results) Recent Labs    07/07/20 1659  BNP 63.8   Basic Metabolic Panel: Recent Labs  Lab 07/07/20 1650 07/09/20 0950 07/11/20 0346 07/12/20 0216  NA 141 135 136 136  K 3.9 3.9 4.1 4.0  CL 104 97* 96* 95*  CO2 _0 33*  GLUCOSE 206* 376* 337* 285*  BUN 16 35* 51* 49*  CREATININE 1.11* 1.34* 1.43* 1.41*  CALCIUM 8.8* 9.4 9.3 9.5  PHOS  --   --  5.6*  --    Liver Function Tests: Recent Labs  Lab 07/11/20 0346  ALBUMIN 3.5   No results for input(s): LIPASE, AMYLASE in the last 168 hours. No  results for input(s): AMMONIA in the last 168 hours. CBC: Recent Labs  Lab 07/07/20 1650 07/09/20 0950 07/11/20 0346 07/12/20 0216  WBC 7.1 11.0* 7.9 9.1  NEUTROABS 5.1  --   --   --   HGB 14.6 15.3* 15.2* 15.7*  HCT 44.6 45.9 44.7 47.3*  MCV 87.6 86.6 86.3 86.6  PLT 215 272 244 264   Cardiac Enzymes: No results for input(s): CKTOTAL, CKMB, CKMBINDEX, TROPONINI in the last 168 hours. BNP: Invalid input(s): POCBNP CBG: Recent Labs  Lab 07/11/20 1129 07/11/20 1521 07/11/20 1951 07/12/20 0746 07/12/20 1223  GLUCAP 328* 324* 377* 291* 281*   D-Dimer No results for input(s): DDIMER in the last 72 hours. Hgb A1c No results for input(s): HGBA1C in the last 72 hours. Lipid Profile No results for input(s): CHOL, HDL, LDLCALC, TRIG, CHOLHDL, LDLDIRECT in the last 72 hours. Thyroid function studies No results for input(s): TSH, T4TOTAL, T3FREE, THYROIDAB in the last 72 hours.  Invalid input(s): FREET3 Anemia work up No results for input(s): VITAMINB12, FOLATE, FERRITIN, TIBC, IRON, RETICCTPCT in the last 72 hours. Urinalysis    Component Value Date/Time   COLORURINE YELLOW 06/15/2018 1155   APPEARANCEUR HAZY (A) 06/15/2018 1155   LABSPEC 1.018 06/15/2018 1155   PHURINE 5.0 06/15/2018 1155   GLUCOSEU NEGATIVE 06/15/2018 1155   HGBUR SMALL (A) 06/15/2018 1155   BILIRUBINUR NEGATIVE 06/15/2018 1155   KETONESUR NEGATIVE 06/15/2018 1155   PROTEINUR NEGATIVE 06/15/2018 1155   UROBILINOGEN 0.2 03/20/2014 0237   NITRITE POSITIVE (A) 06/15/2018 1155   LEUKOCYTESUR SMALL (A) 06/15/2018 1155   Sepsis Labs Invalid input(s): PROCALCITONIN,  WBC,  LACTICIDVEN Microbiology Recent Results (from the past 240 hour(s))  Resp Panel by RT-PCR (Flu A&B, Covid) Nasopharyngeal Swab     Status: None   Collection Time: 07/07/20  7:28 PM   Specimen: Nasopharyngeal Swab; Nasopharyngeal(NP) swabs in vial transport medium  Result Value Ref Range Status   SARS Coronavirus 2 by RT PCR NEGATIVE  NEGATIVE Final    Comment: (NOTE) SARS-CoV-2 target nucleic acids are NOT DETECTED.  The SARS-CoV-2 RNA is generally detectable in upper respiratory specimens during the acute phase of infection. The lowest concentration of SARS-CoV-2 viral copies this assay can detect is 138 copies/mL. A negative result does not preclude SARS-Cov-2 infection and should not be used as the sole basis for treatment or other patient management decisions. A negative result may occur with  improper specimen collection/handling, submission of specimen other than nasopharyngeal swab, presence of viral mutation(s) within  the areas targeted by this assay, and inadequate number of viral copies(<138 copies/mL). A negative result must be combined with clinical observations, patient history, and epidemiological information. The expected result is Negative.  Fact Sheet for Patients:  EntrepreneurPulse.com.au  Fact Sheet for Healthcare Providers:  IncredibleEmployment.be  This test is no t yet approved or cleared by the [place] FDA and  has been authorized for detection and/or diagnosis of SARS-CoV-2 by FDA under an Emergency Use Authorization (EUA). This EUA will remain  in effect (meaning this test can be used) for the duration of the COVID-19 declaration under Section 564(b)(1) of the Act, 21 U.S.C.section 360bbb-3(b)(1), unless the authorization is terminated  or revoked sooner.       Influenza A by PCR NEGATIVE NEGATIVE Final   Influenza B by PCR NEGATIVE NEGATIVE Final    Comment: (NOTE) The Xpert Xpress SARS-CoV-2/FLU/RSV plus assay is intended as an aid in the diagnosis of influenza from Nasopharyngeal swab specimens and should not be used as a sole basis for treatment. Nasal washings and aspirates are unacceptable for Xpert Xpress SARS-CoV-2/FLU/RSV testing.  Fact Sheet for Patients: EntrepreneurPulse.com.au  Fact Sheet for Healthcare  Providers: IncredibleEmployment.be  This test is not yet approved or cleared by the [place] FDA and has been authorized for detection and/or diagnosis of SARS-CoV-2 by FDA under an Emergency Use Authorization (EUA). This EUA will remain in effect (meaning this test can be used) for the duration of the COVID-19 declaration under Section 564(b)(1) of the Act, 21 U.S.C. section 360bbb-3(b)(1), unless the authorization is terminated or revoked.  Performed at [facility], [place] [address] [place], [place] [address]    Time spent: 24mn  SIGNED:   SMarylu Lund MD  [facility] 07/12/2020, 3:34 PM  If 7PM-7AM, please contact night-coverage

## 2020-07-13 ENCOUNTER — Telehealth: Payer: Self-pay

## 2020-07-13 NOTE — Telephone Encounter (Signed)
Transition Care Management Follow-up Telephone Call  Date of discharge and from where: 07/12/2020, [facility]   Call completed with patient's son, Denyse Amass.  He explained that she is doing fine, breathing a lot better. Her husband is with her all day to provide assistance as needed and he transports her to medical appointments.  She is independent with wheelchair mobility, personal care, medication management and trach care. Transfers with a sliding board  She does not have suction or O2 at home. Has a glucometer and has all of her medications  No questions/concerns at this time.   Denyse Amass stated that his mother follows up with a provider at [facility], not [facility].  He said that the provider at [facility] [place] has been prescribing her medications.  Provided him with the number for [facility] if her situation changes and she would like to establish care with another provider. The phone number for [facility] is on her AVS.

## 2020-07-14 ENCOUNTER — Telehealth (HOSPITAL_COMMUNITY): Payer: Self-pay

## 2020-07-14 ENCOUNTER — Other Ambulatory Visit (HOSPITAL_COMMUNITY): Payer: Self-pay

## 2020-07-14 NOTE — Telephone Encounter (Signed)
Pharmacy Transitions of Care Follow-up Telephone Call  Date of discharge: 07/12/20 Discharge Diagnosis: COPD Exacerbation  How have you been since you were released from the hospital? Patient has been doing well. Spoke to husband since she couldn't talk due to lesion on jaw. Currently waiting on dermatologist referral from PCP.  Medication changes made at discharge: START taking:  Cartia XT   diltiazem (CARDIZEM)   Eliquis   predniSONE (DELTASONE)  Start taking on: Jul 12, 2020  CHANGE how you take:  insulin aspart (NOVOLOG)   Lantus SoloStar   pregabalin (LYRICA)   STOP taking:  amLODipine 10 MG tablet (NORVASC)   azithromycin 250 MG tablet (ZITHROMAX)   ibuprofen 200 MG tablet (ADVIL)   ibuprofen 800 MG tablet (ADVIL)   Medication changes verified by the patient? No    Medication Accessibility:  Home Pharmacy: Not discussed  Was the patient provided with refills on discharged medications? No  Have all prescriptions been transferred from [facility] to home pharmacy? No  Is the patient able to afford medications? yes    Medication Review:   APIXABAN (ELIQUIS) for Afib Apixaban 5 mg BID initiated on 07/12/20 - Discussed importance of taking medication around the same time everyday  - Reviewed potential DDIs with patient  - Advised patient of medications to avoid (NSAIDs, ASA)  - Educated that Tylenol (acetaminophen) will be the preferred analgesic to prevent risk of bleeding  - Emphasized importance of monitoring for signs and symptoms of bleeding (abnormal bruising, prolonged bleeding, nose bleeds, bleeding from gums, discolored urine, black tarry stools)  - Advised patient to alert all providers of anticoagulation therapy prior to starting a new medication or having a procedure   Follow-up Appointments:  PCP Hospital f/u appt confirmed? Husband says they do have follow up with primary provider coming up soon. Currently most concerned about dermatologist referral from  PCP  If their condition worsens, is the pt aware to call PCP or go to the Emergency Dept.? yes  Final Patient Assessment: Patient doing well. Has follow up scheduled but needs to get refills from next follow up.

## 2020-08-03 ENCOUNTER — Other Ambulatory Visit: Payer: Self-pay | Admitting: Emergency Medicine

## 2020-08-03 DIAGNOSIS — J432 Centrilobular emphysema: Secondary | ICD-10-CM

## 2020-08-21 ENCOUNTER — Other Ambulatory Visit: Payer: Self-pay | Admitting: Emergency Medicine

## 2020-08-21 DIAGNOSIS — J432 Centrilobular emphysema: Secondary | ICD-10-CM

## 2020-08-22 ENCOUNTER — Other Ambulatory Visit: Payer: Self-pay | Admitting: Emergency Medicine

## 2020-08-22 DIAGNOSIS — J432 Centrilobular emphysema: Secondary | ICD-10-CM

## 2020-08-25 ENCOUNTER — Other Ambulatory Visit: Payer: Self-pay | Admitting: Emergency Medicine

## 2020-08-25 DIAGNOSIS — J432 Centrilobular emphysema: Secondary | ICD-10-CM

## 2020-08-25 NOTE — Telephone Encounter (Signed)
Spoke to patient about scheduling an appointment before being able to receive anymore refills. Will send in one rx to get pt through to next appointment. Called and left pt detailed message about RX.

## 2020-10-04 ENCOUNTER — Encounter: Payer: Self-pay | Admitting: Emergency Medicine

## 2020-10-04 ENCOUNTER — Other Ambulatory Visit: Payer: Self-pay

## 2020-10-04 ENCOUNTER — Ambulatory Visit (INDEPENDENT_AMBULATORY_CARE_PROVIDER_SITE_OTHER): Payer: Medicaid Other | Admitting: Emergency Medicine

## 2020-10-04 DIAGNOSIS — J432 Centrilobular emphysema: Secondary | ICD-10-CM

## 2020-10-04 DIAGNOSIS — C329 Malignant neoplasm of larynx, unspecified: Secondary | ICD-10-CM

## 2020-10-04 DIAGNOSIS — C3492 Malignant neoplasm of unspecified part of left bronchus or lung: Secondary | ICD-10-CM

## 2020-10-04 MED ORDER — AZITHROMYCIN 250 MG PO TABS
250.0000 mg | ORAL_TABLET | Freq: Every day | ORAL | 3 refills | Status: DC
Start: 2020-10-04 — End: 2021-07-04

## 2020-10-04 MED ORDER — GUAIFENESIN ER 600 MG PO TB12
600.0000 mg | ORAL_TABLET | Freq: Two times a day (BID) | ORAL | 1 refills | Status: DC | PRN
Start: 2020-10-04 — End: 2021-07-04

## 2020-10-04 MED ORDER — DOXYCYCLINE HYCLATE 100 MG PO TABS: 100.0000 mg | ORAL_TABLET | Freq: Two times a day (BID) | ORAL | 3 refills | Status: DC

## 2020-10-04 MED ORDER — CEFUROXIME AXETIL 250 MG PO TABS: 250.0000 mg | ORAL_TABLET | Freq: Two times a day (BID) | ORAL | 3 refills | Status: DC

## 2020-10-04 NOTE — Addendum Note (Signed)
Addended by: Gavin Potters R on: 10/04/2020 09:50 AM   Modules accepted: Orders

## 2020-10-04 NOTE — Assessment & Plan Note (Signed)
With laryngectomy.  Question some tracheal stenosis on her previous exams.  She has not been following up with ENT and needs to do so.  I have encouraged her to set that appointment up.  She is usually followed at [facility]

## 2020-10-04 NOTE — Patient Instructions (Signed)
Please continue Stiolto 2 puffs once a day. Keep your albuterol available to use 2 puffs when needed for shortness of breath, chest tightness, wheezing.  This can also help with mucus clearance. Consider trying guaifenesin (Mucinex) 600 mg twice a day to help loosen mucus and make it easier to clear. We will start rotating antibiotics for you to take during the first week of alternating months: -Doxycycline 100 mg twice a day for 7 days -Azithromycin 250 mg once a day for 5 days -Cefuroxime 250 mg twice a day for 7 days Congratulations on stopping smoking You need to follow-up with ENT.  Please arrange to follow-up with them. We will repeat your CT scan of the chest without contrast. Follow with Dr. Lamonte Sakai in 2 months or sooner if you have any problems.

## 2020-10-04 NOTE — Assessment & Plan Note (Signed)
2 exacerbations this year, 1 required hospitalization.  She also has the overlay of upper airway issues given her laryngectomy, stoma, question stenosis.  Has not been follow with ENT but needs to do so.  I encouraged her to set this up.  We will continue her Stiolto, albuterol as needed, consider adding snacks.  I will also add rotating antibiotics: Doxycycline, azithromycin, cefuroxime.

## 2020-10-04 NOTE — Assessment & Plan Note (Signed)
Now approximately 5 years post SBRT.  She needs another surveillance CT.  Once we can establish 5 years of stability posttreatment then we can probably back off on frequency of imaging and that she has symptoms.

## 2020-10-04 NOTE — Progress Notes (Signed)
Subjective:    Patient ID: [name], female    DOB: Jul 21, 1955, 65 y.o.   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  Her last CT chest available in our system was done 11/14/2017, but a CT chest from 11/26/2018 done in the [facility] system showed no evidence of recurrent disease, persistent left upper lobe scar from radiation, resolution of a left apical 9 mm groundglass nodule.  She is being followed by ENT Dr. Donald Pore at [facility], had some evidence of stomal stenosis and a new midline neck nodule. Planning for surgical repair this month.  Current regimen includes Stiolto and albuterol which she uses approximately 1-2x a day. No flares, pred, abx. Minimal sputum, white to beige. Needs her flu shot. PNA shot up to date.   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  Last time she was planning to undergo ENT surgery for stomal stenosis, but this never had to be done. Follows w ENT at [facility]. Most recent CT imaging 11/26/2018 at [facility], report reviewed by me, showed radiation changes in the left upper lobe interval resolution of groundglass nodule. She wants to get her surveillance Ct's here in [place].  She has been managed on Stiolto, albuterol as needed, uses approximately 4x a day currently She is having increased cough last 2 months, some increased SOB and weakness, fatigue. No abx or prednisone since last time.  She hasn't had COVID vaccines yet.  She is not smoking - has a lot of 2nd hand exposure.   ROV 10/04/20 --65 year old woman who follows up today for history of COPD, stage IIa non-small cell lung cancer of the left upper lobe that was treated with SBRT.  Also with a history of  squamous cell laryngeal cancer post laryngectomy 04/2015. She was hospitalized with a COPD exacerbation 10/04/1761, course complicated by atrial fibrillation> a lot of sputum, cough and wheeze at that  Chronically managed on Stiolto.  Has albuterol which she uses approximately 4x a day. She is producing white green mucous [facility]2018 with radiation change, interval resolution of a left upper lobe nodule.  No surveillance CTs done since. She believes that she is improved, but has not really returned to her baseline prior to earlier this year.   Lots of 2nd hand smoke exposure.  COVID vaccinated, no booster Has not followed w ENT in the last year.      Review of Systems  Past Medical History:  Diagnosis Date   Anxiety    Arthritis    COPD (chronic obstructive pulmonary disease) ([place])    Depression    Diabetes mellitus without complication ([place])    INSULIN DEPENDENT   Diabetic neuropathy ([place])    Difficult intubation    total laryngectomy   Fibromyalgia    Head and neck cancer ([place]) 04/19/2015   Hypertension    Kidney stone    Morbid obesity with BMI of 40.0-44.9, adult (HCC)    Neuropathy    Nocturia    Pneumonia    Rash, skin    face   Renal insufficiency    Shortness of breath    Sleep apnea    has not used  cpap past 10 yrs   Urge incontinence      Family History  Adopted: Yes     Social History   Socioeconomic History   Marital status: Married    Spouse name: Not on file   Number of children: Not on file   Years of education: Not on file   Highest education level: Not on file  Occupational History   Not on file  Tobacco Use   Smoking status: Former    Packs/day: 1.00    Years: 40.00    Pack years: 40.00    Types: Cigarettes    Quit date: 05/22/2015    Years since quitting: 5.3   Smokeless tobacco: Never  Substance and Sexual Activity   Alcohol use: No   Drug use: No   Sexual activity: Not on file  Other  Topics Concern   Not on file  Social History Narrative   Not on file   Social Determinants of Health   Financial Resource Strain: Not on file  Food Insecurity: Not on file  Transportation Needs: Not on file  Physical Activity: Not on file  Stress: Not on file  Social Connections: Not on file  Intimate Partner Violence: Not on file     Allergies  Allergen Reactions   Ace Inhibitors Anaphylaxis and Swelling    Angioedema   Zestril [Lisinopril] Anaphylaxis and Swelling    Angioedema     Outpatient Medications Prior to Visit  Medication Sig Dispense Refill   albuterol (PROAIR HFA) 108 (90 Base) MCG/ACT inhaler TAKE 2 PUFFS BY MOUTH EVERY 6 HOURS AS NEEDED FOR WHEEZE OR SHORTNESS OF BREATH 18 g 0   apixaban (ELIQUIS) 5 MG TABS tablet Take 1 tablet (5 mg total) by mouth 2 (two) times daily. 60 tablet 0   atorvastatin (LIPITOR) 20 MG tablet Take 20 mg by mouth daily.     blood glucose meter kit and supplies KIT Dispense based on patient and insurance preference. Use up to four times daily as directed. (FOR ICD-9 250.00, 250.01). 1 each 0   Buprenorphine HCl-Naloxone HCl 8-2 MG FILM Place 1 Film under the tongue 3 (three) times daily.      cyclobenzaprine (FLEXERIL) 10 MG tablet Take 1 tablet by mouth every 12 (twelve) hours.     hydrochlorothiazide (HYDRODIURIL) 25 MG tablet Take 25 mg by mouth daily.     insulin aspart (NOVOLOG) 100 UNIT/ML FlexPen Inject 10 Units into the skin 3 (three) times daily with meals. 15 mL 0   insulin glargine (LANTUS SOLOSTAR) 100 UNIT/ML Solostar Pen Inject 45 Units into the skin daily. At 10AM 15 mL 0   Insulin Pen Needle (PEN NEEDLES 3/16") 31G X 5 MM MISC Use as directed. 100 each 3   pregabalin (LYRICA) 75 MG capsule Take 75 mg by mouth 2 (two) times daily.     STIOLTO RESPIMAT 2.5-2.5 MCG/ACT AERS INHALE 2 PUFFS BY MOUTH INTO THE LUNGS DAILY (Patient taking differently: Inhale 2 puffs into the lungs daily.) 4 g 1   diltiazem (CARDIZEM CD) 240 MG 24  hr capsule Take 1 capsule (240 mg total) by mouth daily. 30 capsule 0   diltiazem (CARDIZEM) 60 MG tablet Take 1 tablet (60 mg total) by mouth every 6 (six) hours for 2 doses. 2 tablet 0   No facility-administered medications prior to visit.        Objective:   Physical Exam Vitals:   10/04/20 0853  BP: (!) 144/90  Pulse: 91  Temp:  98.6 F (37 C)  TempSrc: Oral  SpO2: 94%   Gen: Pleasant, well-nourished, in no distress,  normal affect, communicating with electronic larynx device  ENT: No lesions,  mouth clear,  oropharynx clear, no postnasal drip  Neck: Stoma is well-appearing without any lesions.  No secretions noted  Lungs: No use of accessory muscles, coarse B, no wheezes  Cardiovascular: RRR, heart sounds normal, no murmur or gallops, no peripheral edema  Musculoskeletal: status post left BKA  Neuro: alert, non focal  Skin: Warm, no lesions or rash      Assessment & Plan:  COPD (chronic obstructive pulmonary disease) (HCC) 2 exacerbations this year, 1 required hospitalization.  She also has the overlay of upper airway issues given her laryngectomy, stoma, question stenosis.  Has not been follow with ENT but needs to do so.  I encouraged her to set this up.  We will continue her Stiolto, albuterol as needed, consider adding snacks.  I will also add rotating antibiotics: Doxycycline, azithromycin, cefuroxime.  Non-small cell cancer of left lung ([place]) Now approximately 5 years post SBRT.  She needs another surveillance CT.  Once we can establish 5 years of stability posttreatment then we can probably back off on frequency of imaging and that she has symptoms.  Laryngeal cancer [facility]) With laryngectomy.  Question some tracheal stenosis on her previous exams.  She has not been following up with ENT and needs to do so.  I have encouraged her to set that appointment up.  She is usually followed at [facility] [place], MD, PhD 10/04/2020, 9:22 AM [facility] [phone] or if no answer [phone]

## 2020-10-11 ENCOUNTER — Ambulatory Visit (HOSPITAL_COMMUNITY): Payer: Medicaid Other

## 2020-12-05 ENCOUNTER — Ambulatory Visit: Payer: Medicaid Other | Admitting: Emergency Medicine

## 2021-06-06 ENCOUNTER — Other Ambulatory Visit: Payer: Self-pay | Admitting: Emergency Medicine

## 2021-06-06 DIAGNOSIS — J432 Centrilobular emphysema: Secondary | ICD-10-CM

## 2021-07-03 ENCOUNTER — Inpatient Hospital Stay (HOSPITAL_COMMUNITY)
Admission: EM | Admit: 2021-07-03 | Discharge: 2021-07-06 | DRG: 659 | Disposition: A | Discharge status: Home / Self Care | Payer: Medicaid Other | Attending: Family Medicine | Admitting: Family Medicine

## 2021-07-03 ENCOUNTER — Encounter (HOSPITAL_COMMUNITY): Payer: Self-pay

## 2021-07-03 ENCOUNTER — Emergency Department (HOSPITAL_COMMUNITY): Payer: Medicaid Other

## 2021-07-03 DIAGNOSIS — E785 Hyperlipidemia, unspecified: Secondary | ICD-10-CM | POA: Diagnosis present

## 2021-07-03 DIAGNOSIS — J9621 Acute and chronic respiratory failure with hypoxia: Secondary | ICD-10-CM | POA: Diagnosis present

## 2021-07-03 DIAGNOSIS — Z930 Tracheostomy status: Secondary | ICD-10-CM

## 2021-07-03 DIAGNOSIS — E1165 Type 2 diabetes mellitus with hyperglycemia: Secondary | ICD-10-CM | POA: Diagnosis present

## 2021-07-03 DIAGNOSIS — G4733 Obstructive sleep apnea (adult) (pediatric): Secondary | ICD-10-CM | POA: Diagnosis present

## 2021-07-03 DIAGNOSIS — B964 Proteus (mirabilis) (morganii) as the cause of diseases classified elsewhere: Secondary | ICD-10-CM | POA: Diagnosis present

## 2021-07-03 DIAGNOSIS — Z1619 Resistance to other specified beta lactam antibiotics: Secondary | ICD-10-CM | POA: Diagnosis present

## 2021-07-03 DIAGNOSIS — N136 Pyonephrosis: Principal | ICD-10-CM | POA: Diagnosis present

## 2021-07-03 DIAGNOSIS — R0902 Hypoxemia: Principal | ICD-10-CM

## 2021-07-03 DIAGNOSIS — E1159 Type 2 diabetes mellitus with other circulatory complications: Secondary | ICD-10-CM | POA: Diagnosis present

## 2021-07-03 DIAGNOSIS — L89892 Pressure ulcer of other site, stage 2: Secondary | ICD-10-CM | POA: Diagnosis present

## 2021-07-03 DIAGNOSIS — Z8521 Personal history of malignant neoplasm of larynx: Secondary | ICD-10-CM

## 2021-07-03 DIAGNOSIS — Z87891 Personal history of nicotine dependence: Secondary | ICD-10-CM

## 2021-07-03 DIAGNOSIS — J441 Chronic obstructive pulmonary disease with (acute) exacerbation: Secondary | ICD-10-CM | POA: Diagnosis present

## 2021-07-03 DIAGNOSIS — J9622 Acute and chronic respiratory failure with hypercapnia: Secondary | ICD-10-CM | POA: Diagnosis present

## 2021-07-03 DIAGNOSIS — E876 Hypokalemia: Secondary | ICD-10-CM | POA: Diagnosis present

## 2021-07-03 DIAGNOSIS — N1832 Chronic kidney disease, stage 3b: Secondary | ICD-10-CM | POA: Diagnosis present

## 2021-07-03 DIAGNOSIS — I77810 Thoracic aortic ectasia: Secondary | ICD-10-CM | POA: Diagnosis present

## 2021-07-03 DIAGNOSIS — N111 Chronic obstructive pyelonephritis: Secondary | ICD-10-CM | POA: Diagnosis present

## 2021-07-03 DIAGNOSIS — Z794 Long term (current) use of insulin: Secondary | ICD-10-CM

## 2021-07-03 DIAGNOSIS — M797 Fibromyalgia: Secondary | ICD-10-CM | POA: Diagnosis present

## 2021-07-03 DIAGNOSIS — Z7901 Long term (current) use of anticoagulants: Secondary | ICD-10-CM

## 2021-07-03 DIAGNOSIS — T380X5A Adverse effect of glucocorticoids and synthetic analogues, initial encounter: Secondary | ICD-10-CM | POA: Diagnosis present

## 2021-07-03 DIAGNOSIS — I2721 Secondary pulmonary arterial hypertension: Secondary | ICD-10-CM | POA: Diagnosis present

## 2021-07-03 DIAGNOSIS — Z79899 Other long term (current) drug therapy: Secondary | ICD-10-CM

## 2021-07-03 DIAGNOSIS — Z89512 Acquired absence of left leg below knee: Secondary | ICD-10-CM

## 2021-07-03 DIAGNOSIS — Z85118 Personal history of other malignant neoplasm of bronchus and lung: Secondary | ICD-10-CM

## 2021-07-03 DIAGNOSIS — Z6841 Body Mass Index (BMI) 40.0 and over, adult: Secondary | ICD-10-CM

## 2021-07-03 DIAGNOSIS — Z888 Allergy status to other drugs, medicaments and biological substances status: Secondary | ICD-10-CM

## 2021-07-03 DIAGNOSIS — Z9049 Acquired absence of other specified parts of digestive tract: Secondary | ICD-10-CM

## 2021-07-03 DIAGNOSIS — N390 Urinary tract infection, site not specified: Secondary | ICD-10-CM

## 2021-07-03 DIAGNOSIS — E1122 Type 2 diabetes mellitus with diabetic chronic kidney disease: Secondary | ICD-10-CM | POA: Diagnosis present

## 2021-07-03 DIAGNOSIS — N200 Calculus of kidney: Secondary | ICD-10-CM

## 2021-07-03 DIAGNOSIS — Z1611 Resistance to penicillins: Secondary | ICD-10-CM | POA: Diagnosis present

## 2021-07-03 DIAGNOSIS — I129 Hypertensive chronic kidney disease with stage 1 through stage 4 chronic kidney disease, or unspecified chronic kidney disease: Secondary | ICD-10-CM | POA: Diagnosis present

## 2021-07-03 DIAGNOSIS — I480 Paroxysmal atrial fibrillation: Secondary | ICD-10-CM | POA: Diagnosis present

## 2021-07-03 DIAGNOSIS — E1140 Type 2 diabetes mellitus with diabetic neuropathy, unspecified: Secondary | ICD-10-CM | POA: Diagnosis present

## 2021-07-03 LAB — LIPASE, BLOOD: Lipase: 27 U/L (ref 11–51)

## 2021-07-03 LAB — CBC WITH DIFFERENTIAL/PLATELET
Abs Immature Granulocytes: 0.06 10*3/uL (ref 0.00–0.07)
Basophils Absolute: 0 10*3/uL (ref 0.0–0.1)
Basophils Relative: 0 %
Eosinophils Absolute: 0 10*3/uL (ref 0.0–0.5)
Eosinophils Relative: 0 %
HCT: 46.3 % — ABNORMAL HIGH (ref 36.0–46.0)
Hemoglobin: 14.9 g/dL (ref 12.0–15.0)
Immature Granulocytes: 1 %
Lymphocytes Relative: 8 %
Lymphs Abs: 0.7 10*3/uL (ref 0.7–4.0)
MCH: 28.3 pg (ref 26.0–34.0)
MCHC: 32.2 g/dL (ref 30.0–36.0)
MCV: 87.9 fL (ref 80.0–100.0)
Monocytes Absolute: 0.3 10*3/uL (ref 0.1–1.0)
Monocytes Relative: 4 %
Neutro Abs: 8 10*3/uL — ABNORMAL HIGH (ref 1.7–7.7)
Neutrophils Relative %: 87 %
Platelets: 191 10*3/uL (ref 150–400)
RBC: 5.27 MIL/uL — ABNORMAL HIGH (ref 3.87–5.11)
RDW: 13.3 % (ref 11.5–15.5)
WBC: 9.1 10*3/uL (ref 4.0–10.5)
nRBC: 0 % (ref 0.0–0.2)

## 2021-07-03 LAB — URINALYSIS, ROUTINE W REFLEX MICROSCOPIC
Bilirubin Urine: NEGATIVE
Glucose, UA: NEGATIVE mg/dL
Ketones, ur: NEGATIVE mg/dL
Nitrite: POSITIVE — AB
Protein, ur: 100 mg/dL — AB
Specific Gravity, Urine: 1.015 (ref 1.005–1.030)
pH: 8 (ref 5.0–8.0)

## 2021-07-03 LAB — COMPREHENSIVE METABOLIC PANEL
ALT: 14 U/L (ref 0–44)
AST: 15 U/L (ref 15–41)
Albumin: 3.3 g/dL — ABNORMAL LOW (ref 3.5–5.0)
Alkaline Phosphatase: 66 U/L (ref 38–126)
Anion gap: 8 (ref 5–15)
BUN: 19 mg/dL (ref 8–23)
CO2: 26 mmol/L (ref 22–32)
Calcium: 8.4 mg/dL — ABNORMAL LOW (ref 8.9–10.3)
Chloride: 102 mmol/L (ref 98–111)
Creatinine, Ser: 1.36 mg/dL — ABNORMAL HIGH (ref 0.44–1.00)
GFR, Estimated: 44 mL/min — ABNORMAL LOW (ref >60)
Glucose, Bld: 203 mg/dL — ABNORMAL HIGH (ref 70–99)
Potassium: 3.7 mmol/L (ref 3.5–5.1)
Sodium: 136 mmol/L (ref 135–145)
Total Bilirubin: 0.7 mg/dL (ref 0.3–1.2)
Total Protein: 6.5 g/dL (ref 6.5–8.1)

## 2021-07-03 LAB — LACTIC ACID, PLASMA: Lactic Acid, Venous: 0.9 mmol/L (ref 0.5–1.9)

## 2021-07-03 LAB — TROPONIN I (HIGH SENSITIVITY): Troponin I (High Sensitivity): 13 ng/L (ref <18)

## 2021-07-03 MED ORDER — SODIUM CHLORIDE 0.9 % IV SOLN
1.0000 g | Freq: Once | INTRAVENOUS | Status: AC
  Administered 2021-07-03: 1 g via INTRAVENOUS
  Filled 2021-07-03: qty 10

## 2021-07-03 MED ORDER — ONDANSETRON HCL 4 MG/2ML IJ SOLN
4.0000 mg | Freq: Once | INTRAMUSCULAR | Status: AC
  Administered 2021-07-03: 4 mg via INTRAVENOUS
  Filled 2021-07-03: qty 2

## 2021-07-03 NOTE — ED Provider Notes (Signed)
?[facility] ?Provider Note ? ?CSN: [id number] ?Arrival date & time: 07/03/21 2006 ? ?Chief Complaint(s) ?Shortness of Breath (Presents with voicebox, increased mucus production. Tachypneic and accessory muscle use upon arrival. 83% RA. Hx of COPD- baseline SpO2 @ 92% RA.) and Dysuria (States decreased urine production since Friday, c/o bilateral flank pain. Temp @ home 101.23F) ? ?HPI ?[name] is a 66 y.o. female with PMH COPD laryngeal cancer status post laryngectomy, trach dependent, T2DM who presents emergency department for evaluation of multiple complaints including worsening shortness of breath, bilateral flank plain, lower abdominal pain and fever.  Patient febrile at home Tmax 101.3.  Patient arrives 83% on room air with shortness of breath and increased mucus production. ? ? ?Past Medical History ?Past Medical History:  ?Diagnosis Date  ? Anxiety   ? Arthritis   ? COPD (chronic obstructive pulmonary disease) ([place])   ? Depression   ? Diabetes mellitus without complication ([place])   ? INSULIN DEPENDENT  ? Diabetic neuropathy ([place])   ? Difficult intubation   ? total laryngectomy  ? Fibromyalgia   ? Head and neck cancer ([place]) 04/19/2015  ? Hypertension   ? Kidney stone   ? Morbid obesity with BMI of 40.0-44.9, adult ([place])   ? Neuropathy   ? Nocturia   ? Pneumonia   ? Rash, skin   ? face  ? Renal insufficiency   ? Shortness of breath   ? Sleep apnea   ? has not used cpap past 10 yrs  ? Urge incontinence   ? ?Patient Active Problem List  ? Diagnosis Date Noted  ? Atrial fibrillation with RVR ([place]) 07/11/2020  ? 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 Acute hypoxemic respiratory failure ([place]) 07/08/2020  ? Type 2 diabetes mellitus ([place]) 07/08/2020  ? Hypertensive urgency 07/08/2020  ? Hyperlipidemia 07/08/2020  ? Pressure injury of skin 07/08/2020  ? COPD exacerbation ([place]) 07/07/2020  ? Fever and chills   ? Hypoxia   ? Abnormal CT of the chest 11/28/2017  ? Non-small cell  cancer of left lung ([place]) 10/17/2017  ? Laryngeal cancer ([place]) 10/17/2017  ? Post-operative pain   ? Constipation due to pain medication   ? Neuropathic pain   ? Muscle spasm   ? SIRS (systemic inflammatory response syndrome) ([place]) 09/18/2015  ? Labile blood pressure   ? DM type 2 with diabetic peripheral neuropathy ([place])   ? Abnormality of gait   ? Unilateral complete BKA ([place]) 09/15/2015  ? Status post below knee amputation of left lower extremity   ? Acute bronchitis   ? Primary osteoarthritis of right knee   ? Infection   ? Sepsis ([place]) 09/09/2015  ? Wound infection 09/09/2015  ? Diabetic ulcer of foot with necrosis of bone ([place]) 09/09/2015  ? Type 2 diabetes mellitus with circulatory disorder ([place])   ? Osteomyelitis of foot, acute, left 08/25/2015  ? Diabetic foot infection ([place]) 04/19/2015  ? Diabetic foot ulcer ([place]) 04/19/2015  ? CKD (chronic kidney disease) 04/19/2015  ? Head and neck cancer ([place]) 04/19/2015  ? Right hip pain 04/19/2015  ? Infection of supraglottis (throat) 09/16/2014  ? Incidental lung nodule, less than or equal to 11m 09/16/2014  ? Obstructive uropathy 03/22/2014  ? COPD (chronic obstructive pulmonary disease) ([place] 03/20/2014  ? Controlled type 2 diabetes mellitus with diabetic nephropathy ([place] 03/20/2014  ? Lice infested hair 014/97/0263 ? Former smoker 08/05/2012  ? TINEA CORPORIS 09/01/2009  ? DEPRESSION 08/11/2009  ?  Chronic pain syndrome 04/15/2007  ? HYPERLIPIDEMIA NEC/NOS 11/02/2006  ? Peripheral vascular disease ([place]) 11/02/2006  ? MORBID OBESITY 10/28/2006  ? Obstructive sleep apnea 10/28/2006  ? POLYNEUROPATHY 10/28/2006  ? Benign essential HTN 10/28/2006  ? OSTEOARTHRITIS, KNEES, BILATERAL 10/28/2006  ? PROTEINURIA 11/25/2005  ? ?Home Medication(s) ?Prior to Admission medications   ?Medication Sig Start Date End Date Taking? Authorizing Provider  ?albuterol (VENTOLIN HFA) 108 (90 Base) MCG/ACT inhaler INHALE 2 PUFFS INTO THE LUNGS EVERY 4 (FOUR) HOURS AS NEEDED FOR WHEEZING  OR SHORTNESS OF BREATH. 06/08/21   Collene Gobble, MD  ?apixaban (ELIQUIS) 5 MG TABS tablet Take 1 tablet (5 mg total) by mouth 2 (two) times daily. 07/12/20 10/04/20  Donne Hazel, MD  ?atorvastatin (LIPITOR) 20 MG tablet Take 20 mg by mouth daily. 04/13/20   [provider]  ?azithromycin (ZITHROMAX) 250 MG tablet Take 1 tablet (250 mg total) by mouth daily. 10/04/20   Collene Gobble, MD  ?blood glucose meter kit and supplies KIT Dispense based on patient and insurance preference. Use up to four times daily as directed. (FOR ICD-9 250.00, 250.01). 03/24/14   Bonnielee Haff, MD  ?Buprenorphine HCl-Naloxone HCl 8-2 MG FILM Place 1 Film under the tongue 3 (three) times daily.     [provider]  ?cefUROXime (CEFTIN) 250 MG tablet Take 1 tablet (250 mg total) by mouth 2 (two) times daily with a meal. 10/04/20   Byrum, Rose Fillers, MD  ?cyclobenzaprine (FLEXERIL) 10 MG tablet Take 1 tablet by mouth every 12 (twelve) hours. 05/09/20   [provider]  ?diltiazem (CARDIZEM CD) 240 MG 24 hr capsule Take 1 capsule (240 mg total) by mouth daily. 07/13/20 08/12/20  Donne Hazel, MD  ?diltiazem (CARDIZEM) 60 MG tablet Take 1 tablet (60 mg total) by mouth every 6 (six) hours for 2 doses. 07/12/20 07/13/20  Donne Hazel, MD  ?doxycycline (VIBRA-TABS) 100 MG tablet Take 1 tablet (100 mg total) by mouth 2 (two) times daily. 10/04/20   Collene Gobble, MD  ?guaiFENesin (MUCINEX) 600 MG 12 hr tablet Take 1 tablet (600 mg total) by mouth 2 (two) times daily as needed. 10/04/20   Collene Gobble, MD  ?hydrochlorothiazide (HYDRODIURIL) 25 MG tablet Take 25 mg by mouth daily.    [provider]  ?insulin aspart (NOVOLOG) 100 UNIT/ML FlexPen Inject 10 Units into the skin 3 (three) times daily with meals. 07/12/20   Donne Hazel, MD  ?insulin glargine (LANTUS SOLOSTAR) 100 UNIT/ML Solostar Pen Inject 45 Units into the skin daily. At 10AM 07/12/20   Donne Hazel, MD  ?Insulin Pen Needle (PEN NEEDLES  3/16") 31G X 5 MM MISC Use as directed. 03/24/14   Bonnielee Haff, MD  ?pregabalin (LYRICA) 75 MG capsule Take 75 mg by mouth 2 (two) times daily.    [provider]  ?STIOLTO RESPIMAT 2.5-2.5 MCG/ACT AERS INHALE 2 PUFFS BY MOUTH INTO THE LUNGS DAILY ?Patient taking differently: Inhale 2 puffs into the lungs daily. 05/11/20   Collene Gobble, MD  ?                                                                                                                                  ?  Past Surgical History ?Past Surgical History:  ?Procedure Laterality Date  ? AMPUTATION Left 08/25/2015  ? Procedure: 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;  Surgeon: Newt Minion, MD;  Location: [facility];  Service: Orthopedics;  Laterality: Left;  ? AMPUTATION Left 09/13/2015  ? Procedure: AMPUTATION BELOW KNEE;  Surgeon: Marybelle Killings, MD;  Location: [facility];  Service: Orthopedics;  Laterality: Left;  ? BRAIN SURGERY    ? vertebral aneurysm  ? BREAST LUMPECTOMY  1982  ? benign  ? CARPEL TUNNEL    ? CHOLECYSTECTOMY    ? CYSTOSCOPY W/ URETERAL STENT PLACEMENT Left 03/22/2014  ? Procedure: CYSTOSCOPY WITH RETROGRADE PYELOGRAM/URETERAL STENT PLACEMENT;  Surgeon: Alexis Frock, MD;  Location: [facility] [place];  Service: Urology;  Laterality: Left;  ? CYSTOSCOPY WITH RETROGRADE PYELOGRAM, URETEROSCOPY AND STENT PLACEMENT Left 05/11/2014  ? Procedure: CYSTOSCOPY WITH RETROGRADE PYELOGRAM, URETEROSCOPY AND STENT PLACEMENT;  Surgeon: Alexis Frock, MD;  Location: [facility];  Service: Urology;  Laterality: Left;  ? HOLMIUM LASER APPLICATION Left 04/13/4713  ? Procedure: HOLMIUM LASER APPLICATION;  Surgeon: Alexis Frock, MD;  Location: [facility];  Service: Urology;  Laterality: Left;  ? LARYNGECTOMY    ? total ( stoma)  ? LEG AMPUTATION BELOW KNEE Left 09/13/2015  ? SALIVARY STONE REMOVAL    ? TONSILLECTOMY    ? TUBAL LIGATION    ? ?Family History ?Family History  ?Adopted: Yes  ? ? ?Social History ?Social History  ? ?Tobacco Use  ? Smoking status: Former  ?  Packs/day: 1.00  ?   Years: 40.00  ?  Pack years: 40.00  ?  Types: Cigarettes  ?  Quit date: 05/22/2015  ?  Years since quitting: 6.1  ? Smokeless tobacco: Never  ?Substance Use Topics  ? Alcohol use: No  ? Drug use: No  ?

## 2021-07-04 ENCOUNTER — Encounter (HOSPITAL_COMMUNITY)
Admission: EM | Disposition: A | Discharge status: Home / Self Care | Payer: Self-pay | Source: Home / Self Care | Attending: Family Medicine

## 2021-07-04 ENCOUNTER — Encounter (HOSPITAL_COMMUNITY): Payer: Self-pay | Admitting: Family Medicine

## 2021-07-04 ENCOUNTER — Inpatient Hospital Stay (HOSPITAL_COMMUNITY): Payer: Medicaid Other

## 2021-07-04 ENCOUNTER — Inpatient Hospital Stay (HOSPITAL_COMMUNITY): Payer: Medicaid Other | Admitting: Certified Registered Nurse Anesthetist

## 2021-07-04 ENCOUNTER — Emergency Department (HOSPITAL_COMMUNITY): Payer: Medicaid Other

## 2021-07-04 DIAGNOSIS — E876 Hypokalemia: Secondary | ICD-10-CM | POA: Diagnosis present

## 2021-07-04 DIAGNOSIS — Z85118 Personal history of other malignant neoplasm of bronchus and lung: Secondary | ICD-10-CM | POA: Diagnosis not present

## 2021-07-04 DIAGNOSIS — G4733 Obstructive sleep apnea (adult) (pediatric): Secondary | ICD-10-CM | POA: Diagnosis present

## 2021-07-04 DIAGNOSIS — N1832 Chronic kidney disease, stage 3b: Secondary | ICD-10-CM | POA: Diagnosis present

## 2021-07-04 DIAGNOSIS — M797 Fibromyalgia: Secondary | ICD-10-CM | POA: Diagnosis present

## 2021-07-04 DIAGNOSIS — E1151 Type 2 diabetes mellitus with diabetic peripheral angiopathy without gangrene: Secondary | ICD-10-CM | POA: Diagnosis not present

## 2021-07-04 DIAGNOSIS — Z6841 Body Mass Index (BMI) 40.0 and over, adult: Secondary | ICD-10-CM | POA: Diagnosis not present

## 2021-07-04 DIAGNOSIS — I480 Paroxysmal atrial fibrillation: Secondary | ICD-10-CM

## 2021-07-04 DIAGNOSIS — I129 Hypertensive chronic kidney disease with stage 1 through stage 4 chronic kidney disease, or unspecified chronic kidney disease: Secondary | ICD-10-CM | POA: Diagnosis present

## 2021-07-04 DIAGNOSIS — N201 Calculus of ureter: Secondary | ICD-10-CM | POA: Diagnosis not present

## 2021-07-04 DIAGNOSIS — N390 Urinary tract infection, site not specified: Secondary | ICD-10-CM

## 2021-07-04 DIAGNOSIS — N200 Calculus of kidney: Secondary | ICD-10-CM | POA: Diagnosis present

## 2021-07-04 DIAGNOSIS — I77810 Thoracic aortic ectasia: Secondary | ICD-10-CM | POA: Diagnosis not present

## 2021-07-04 DIAGNOSIS — Z888 Allergy status to other drugs, medicaments and biological substances status: Secondary | ICD-10-CM | POA: Diagnosis not present

## 2021-07-04 DIAGNOSIS — J449 Chronic obstructive pulmonary disease, unspecified: Secondary | ICD-10-CM | POA: Diagnosis not present

## 2021-07-04 DIAGNOSIS — E1159 Type 2 diabetes mellitus with other circulatory complications: Secondary | ICD-10-CM | POA: Diagnosis present

## 2021-07-04 DIAGNOSIS — Z1611 Resistance to penicillins: Secondary | ICD-10-CM | POA: Diagnosis present

## 2021-07-04 DIAGNOSIS — Z89512 Acquired absence of left leg below knee: Secondary | ICD-10-CM | POA: Diagnosis not present

## 2021-07-04 DIAGNOSIS — I2721 Secondary pulmonary arterial hypertension: Secondary | ICD-10-CM | POA: Diagnosis present

## 2021-07-04 DIAGNOSIS — J9621 Acute and chronic respiratory failure with hypoxia: Secondary | ICD-10-CM | POA: Diagnosis present

## 2021-07-04 DIAGNOSIS — Z8521 Personal history of malignant neoplasm of larynx: Secondary | ICD-10-CM | POA: Diagnosis not present

## 2021-07-04 DIAGNOSIS — Z9049 Acquired absence of other specified parts of digestive tract: Secondary | ICD-10-CM | POA: Diagnosis not present

## 2021-07-04 DIAGNOSIS — N111 Chronic obstructive pyelonephritis: Secondary | ICD-10-CM

## 2021-07-04 DIAGNOSIS — E1140 Type 2 diabetes mellitus with diabetic neuropathy, unspecified: Secondary | ICD-10-CM | POA: Diagnosis present

## 2021-07-04 DIAGNOSIS — N136 Pyonephrosis: Secondary | ICD-10-CM | POA: Diagnosis present

## 2021-07-04 DIAGNOSIS — J441 Chronic obstructive pulmonary disease with (acute) exacerbation: Secondary | ICD-10-CM

## 2021-07-04 DIAGNOSIS — E1122 Type 2 diabetes mellitus with diabetic chronic kidney disease: Secondary | ICD-10-CM | POA: Diagnosis present

## 2021-07-04 DIAGNOSIS — Z1619 Resistance to other specified beta lactam antibiotics: Secondary | ICD-10-CM | POA: Diagnosis present

## 2021-07-04 DIAGNOSIS — B964 Proteus (mirabilis) (morganii) as the cause of diseases classified elsewhere: Secondary | ICD-10-CM | POA: Diagnosis present

## 2021-07-04 DIAGNOSIS — J9622 Acute and chronic respiratory failure with hypercapnia: Secondary | ICD-10-CM | POA: Diagnosis present

## 2021-07-04 DIAGNOSIS — Z794 Long term (current) use of insulin: Secondary | ICD-10-CM

## 2021-07-04 HISTORY — PX: CYSTOSCOPY WITH STENT PLACEMENT: SHX5790

## 2021-07-04 LAB — I-STAT VENOUS BLOOD GAS, ED
Acid-Base Excess: 3 mmol/L — ABNORMAL HIGH (ref 0.0–2.0)
Bicarbonate: 32.6 mmol/L — ABNORMAL HIGH (ref 20.0–28.0)
Calcium, Ion: 1.12 mmol/L — ABNORMAL LOW (ref 1.15–1.40)
HCT: 45 % (ref 36.0–46.0)
Hemoglobin: 15.3 g/dL — ABNORMAL HIGH (ref 12.0–15.0)
O2 Saturation: 98 %
Potassium: 3.8 mmol/L (ref 3.5–5.1)
Sodium: 137 mmol/L (ref 135–145)
TCO2: 35 mmol/L — ABNORMAL HIGH (ref 22–32)
pCO2, Ven: 73.6 mmHg (ref 44–60)
pH, Ven: 7.254 (ref 7.25–7.43)
pO2, Ven: 134 mmHg — ABNORMAL HIGH (ref 32–45)

## 2021-07-04 LAB — GLUCOSE, CAPILLARY
Glucose-Capillary: 165 mg/dL — ABNORMAL HIGH (ref 70–99)
Glucose-Capillary: 177 mg/dL — ABNORMAL HIGH (ref 70–99)
Glucose-Capillary: 146 mg/dL — ABNORMAL HIGH (ref 70–99)
Glucose-Capillary: 205 mg/dL — ABNORMAL HIGH (ref 70–99)
Glucose-Capillary: 168 mg/dL — ABNORMAL HIGH (ref 70–99)

## 2021-07-04 SURGERY — CYSTOSCOPY, WITH STENT INSERTION
Anesthesia: General | Laterality: Right

## 2021-07-04 MED ORDER — ACETAMINOPHEN 650 MG RE SUPP: 650.0000 mg | Freq: Four times a day (QID) | RECTAL | Status: DC | PRN

## 2021-07-04 MED ORDER — IPRATROPIUM-ALBUTEROL 0.5-2.5 (3) MG/3ML IN SOLN
3.0000 mL | Freq: Once | RESPIRATORY_TRACT | Status: DC
  Filled 2021-07-04: qty 3

## 2021-07-04 MED ORDER — INSULIN GLARGINE-YFGN 100 UNIT/ML ~~LOC~~ SOLN
20.0000 [IU] | Freq: Every day | SUBCUTANEOUS | Status: DC
  Filled 2021-07-04: qty 0.2

## 2021-07-04 MED ORDER — SENNOSIDES-DOCUSATE SODIUM 8.6-50 MG PO TABS
1.0000 | ORAL_TABLET | Freq: Every evening | ORAL | Status: DC | PRN
  Administered 2021-07-05: 1 via ORAL
  Filled 2021-07-04: qty 1

## 2021-07-04 MED ORDER — 0.9 % SODIUM CHLORIDE (POUR BTL) OPTIME
TOPICAL | Status: DC | PRN
  Administered 2021-07-04: 1000 mL

## 2021-07-04 MED ORDER — LIDOCAINE HCL URETHRAL/MUCOSAL 2 % EX GEL
CUTANEOUS | Status: AC
  Filled 2021-07-04: qty 11

## 2021-07-04 MED ORDER — METHYLPREDNISOLONE SODIUM SUCC 125 MG IJ SOLR: 80.0000 mg | Freq: Once | INTRAMUSCULAR | Status: DC

## 2021-07-04 MED ORDER — ACETAMINOPHEN 325 MG PO TABS
650.0000 mg | ORAL_TABLET | Freq: Four times a day (QID) | ORAL | Status: DC | PRN
  Administered 2021-07-06: 650 mg via ORAL
  Filled 2021-07-04: qty 2

## 2021-07-04 MED ORDER — PREGABALIN 75 MG PO CAPS
75.0000 mg | ORAL_CAPSULE | Freq: Once | ORAL | Status: AC
  Administered 2021-07-04: 75 mg via ORAL
  Filled 2021-07-04: qty 1

## 2021-07-04 MED ORDER — FENTANYL CITRATE (PF) 100 MCG/2ML IJ SOLN: 25.0000 ug | INTRAMUSCULAR | Status: DC | PRN

## 2021-07-04 MED ORDER — HEPARIN SODIUM (PORCINE) 1000 UNIT/ML IJ SOLN
INTRAMUSCULAR | Status: AC
  Filled 2021-07-04: qty 20

## 2021-07-04 MED ORDER — FENTANYL CITRATE PF 50 MCG/ML IJ SOSY: 12.5000 ug | PREFILLED_SYRINGE | INTRAMUSCULAR | Status: DC | PRN

## 2021-07-04 MED ORDER — SODIUM CHLORIDE 0.9 % IV SOLN
1.0000 g | INTRAVENOUS | Status: DC
  Administered 2021-07-04 – 2021-07-05 (×2): 1 g via INTRAVENOUS
  Filled 2021-07-04 (×2): qty 10

## 2021-07-04 MED ORDER — FENTANYL CITRATE (PF) 100 MCG/2ML IJ SOLN
INTRAMUSCULAR | Status: DC | PRN
  Administered 2021-07-04: 50 ug via INTRAVENOUS

## 2021-07-04 MED ORDER — PREDNISONE 20 MG PO TABS
40.0000 mg | ORAL_TABLET | Freq: Every day | ORAL | Status: DC
  Administered 2021-07-05 – 2021-07-06 (×2): 40 mg via ORAL
  Filled 2021-07-04 (×2): qty 2

## 2021-07-04 MED ORDER — IPRATROPIUM-ALBUTEROL 0.5-2.5 (3) MG/3ML IN SOLN
3.0000 mL | Freq: Three times a day (TID) | RESPIRATORY_TRACT | Status: DC
Start: 2021-07-05 — End: 2021-07-06
  Administered 2021-07-05 – 2021-07-06 (×4): 3 mL via RESPIRATORY_TRACT
  Filled 2021-07-04 (×4): qty 3

## 2021-07-04 MED ORDER — IOHEXOL 350 MG/ML SOLN
80.0000 mL | Freq: Once | INTRAVENOUS | Status: AC | PRN
  Administered 2021-07-04: 80 mL via INTRAVENOUS

## 2021-07-04 MED ORDER — LIDOCAINE HCL (CARDIAC) PF 100 MG/5ML IV SOSY
PREFILLED_SYRINGE | INTRAVENOUS | Status: DC | PRN
  Administered 2021-07-04: 80 mg via INTRAVENOUS

## 2021-07-04 MED ORDER — SODIUM CHLORIDE 0.9% FLUSH
3.0000 mL | Freq: Two times a day (BID) | INTRAVENOUS | Status: DC
  Administered 2021-07-04 – 2021-07-06 (×3): 3 mL via INTRAVENOUS

## 2021-07-04 MED ORDER — IPRATROPIUM-ALBUTEROL 0.5-2.5 (3) MG/3ML IN SOLN
3.0000 mL | Freq: Four times a day (QID) | RESPIRATORY_TRACT | Status: DC
  Administered 2021-07-04 (×2): 3 mL via RESPIRATORY_TRACT
  Filled 2021-07-04 (×2): qty 3

## 2021-07-04 MED ORDER — LIDOCAINE 2% (20 MG/ML) 5 ML SYRINGE
INTRAMUSCULAR | Status: AC
  Filled 2021-07-04: qty 5

## 2021-07-04 MED ORDER — SODIUM CHLORIDE 0.9 % IV SOLN: INTRAVENOUS | Status: DC

## 2021-07-04 MED ORDER — HEPARIN SODIUM (PORCINE) 1000 UNIT/ML IJ SOLN
INTRAMUSCULAR | Status: AC
  Filled 2021-07-04: qty 1

## 2021-07-04 MED ORDER — INSULIN ASPART 100 UNIT/ML IJ SOLN
0.0000 [IU] | INTRAMUSCULAR | Status: DC
  Administered 2021-07-04: 1 [IU] via SUBCUTANEOUS
  Administered 2021-07-04: 3 [IU] via SUBCUTANEOUS
  Administered 2021-07-05: 1 [IU] via SUBCUTANEOUS
  Administered 2021-07-05 (×4): 3 [IU] via SUBCUTANEOUS
  Administered 2021-07-05: 1 [IU] via SUBCUTANEOUS

## 2021-07-04 MED ORDER — ONDANSETRON HCL 4 MG/2ML IJ SOLN: 4.0000 mg | Freq: Once | INTRAMUSCULAR | Status: DC | PRN

## 2021-07-04 MED ORDER — ONDANSETRON HCL 4 MG/2ML IJ SOLN
INTRAMUSCULAR | Status: DC | PRN
  Administered 2021-07-04: 4 mg via INTRAVENOUS

## 2021-07-04 MED ORDER — CYCLOBENZAPRINE HCL 10 MG PO TABS
10.0000 mg | ORAL_TABLET | Freq: Once | ORAL | Status: AC | PRN
  Administered 2021-07-04: 10 mg via ORAL
  Filled 2021-07-04: qty 1

## 2021-07-04 MED ORDER — PROPOFOL 10 MG/ML IV BOLUS
INTRAVENOUS | Status: AC
  Filled 2021-07-04: qty 20

## 2021-07-04 MED ORDER — FENTANYL CITRATE (PF) 250 MCG/5ML IJ SOLN
INTRAMUSCULAR | Status: AC
  Filled 2021-07-04: qty 5

## 2021-07-04 MED ORDER — PROPOFOL 10 MG/ML IV BOLUS
INTRAVENOUS | Status: DC | PRN
  Administered 2021-07-04 (×2): 100 mg via INTRAVENOUS

## 2021-07-04 MED ORDER — SODIUM CHLORIDE 0.9 % IV SOLN: INTRAVENOUS | Status: DC | PRN

## 2021-07-04 MED ORDER — ONDANSETRON HCL 4 MG PO TABS: 4.0000 mg | ORAL_TABLET | Freq: Four times a day (QID) | ORAL | Status: DC | PRN

## 2021-07-04 MED ORDER — IOHEXOL 300 MG/ML  SOLN
INTRAMUSCULAR | Status: DC | PRN
Start: 2021-07-04 — End: 2021-07-04
  Administered 2021-07-04: 100 mL via URETHRAL

## 2021-07-04 MED ORDER — INSULIN GLARGINE-YFGN 100 UNIT/ML ~~LOC~~ SOLN
30.0000 [IU] | Freq: Every day | SUBCUTANEOUS | Status: DC
  Administered 2021-07-04 – 2021-07-06 (×3): 30 [IU] via SUBCUTANEOUS
  Filled 2021-07-04 (×3): qty 0.3

## 2021-07-04 MED ORDER — SODIUM CHLORIDE 0.9 % IR SOLN
Status: DC | PRN
  Administered 2021-07-04: 3000 mL

## 2021-07-04 MED ORDER — ONDANSETRON HCL 4 MG/2ML IJ SOLN: 4.0000 mg | Freq: Four times a day (QID) | INTRAMUSCULAR | Status: DC | PRN

## 2021-07-04 MED ORDER — ALBUTEROL SULFATE (2.5 MG/3ML) 0.083% IN NEBU: 2.5000 mg | INHALATION_SOLUTION | RESPIRATORY_TRACT | Status: DC | PRN

## 2021-07-04 MED ORDER — SODIUM CHLORIDE 0.9 % IV SOLN: INTRAVENOUS | Status: AC

## 2021-07-04 SURGICAL SUPPLY — 26 items
BAG DRN RND TRDRP ANRFLXCHMBR (UROLOGICAL SUPPLIES) ×1
BAG URINE DRAIN 2000ML AR STRL (UROLOGICAL SUPPLIES) ×3 IMPLANT
BAG URO CATCHER STRL LF (MISCELLANEOUS) ×3 IMPLANT
CATH FOLEY 2WAY SLVR  5CC 16FR (CATHETERS)
CATH FOLEY 2WAY SLVR 5CC 16FR (CATHETERS) IMPLANT
CATH INTERMIT  6FR 70CM (CATHETERS) ×3 IMPLANT
GLOVE BIO SURGEON STRL SZ7.5 (GLOVE) ×3 IMPLANT
GLOVE SURG SS PI 7.5 STRL IVOR (GLOVE) ×2 IMPLANT
GOWN STRL REUS W/ TWL LRG LVL3 (GOWN DISPOSABLE) ×2 IMPLANT
GOWN STRL REUS W/ TWL XL LVL3 (GOWN DISPOSABLE) ×2 IMPLANT
GOWN STRL REUS W/TWL 2XL LVL3 (GOWN DISPOSABLE) ×2 IMPLANT
GOWN STRL REUS W/TWL LRG LVL3 (GOWN DISPOSABLE)
GOWN STRL REUS W/TWL XL LVL3 (GOWN DISPOSABLE) ×2
GUIDEWIRE ANG ZIPWIRE 038X150 (WIRE) IMPLANT
GUIDEWIRE STR DUAL SENSOR (WIRE) ×3 IMPLANT
KIT TURNOVER KIT B (KITS) ×3 IMPLANT
MANIFOLD NEPTUNE II (INSTRUMENTS) ×3 IMPLANT
MAT PREVALON FULL STRYKER (MISCELLANEOUS) ×1 IMPLANT
NS IRRIG 1000ML POUR BTL (IV SOLUTION) ×3 IMPLANT
PACK CYSTO (CUSTOM PROCEDURE TRAY) ×3 IMPLANT
STENT URET 6FRX24 CONTOUR (STENTS) IMPLANT
STENT URET 6FRX26 CONTOUR (STENTS) ×1 IMPLANT
SYPHON OMNI JUG (MISCELLANEOUS) ×2 IMPLANT
TOWEL GREEN STERILE FF (TOWEL DISPOSABLE) ×3 IMPLANT
TUBE CONNECTING 12X1/4 (SUCTIONS) ×1 IMPLANT
WATER STERILE IRR 3000ML UROMA (IV SOLUTION) ×2 IMPLANT

## 2021-07-04 NOTE — ED Notes (Signed)
Patient transported upstairs by this RN on bedside moniotr and venti-mask ?

## 2021-07-04 NOTE — ED Provider Notes (Addendum)
Patient signed out pending CT scan. ? ?In brief presented with shortness of breath in addition to flank pain and dysuria.  Has evidence of UTI.  Had noted O2 sats of 83% on room air.  She does wear 2 L of oxygen as needed.  However, she is currently on 4 L to keep her oxygen sats up.  Chest x-ray is unremarkable.  CT PE study and CT abdomen pelvis obtained.  She was given Rocephin for UTI.  No evidence of sepsis. ? ?3:05 AM ?CT independently reviewed by myself.  CT with evidence of obstructing kidney stones on the right with hydronephrosis.  This complicates her UTI.  No evidence of PE.  She does have evidence of pulmonary hypertension.  We will plan for admission to the hospitalist.  She will also need urology evaluation given infected stone. ? ?3:53 AM ?Spoke with Dr. Gloriann Loan.  Patient to be evaluated by urology.  No further recommendations at this time. ? ?Physical Exam  ?BP (!) 142/70   Pulse 87   Temp 99 ?F (37.2 ?C) (Oral)   Resp 12   Ht 1.676 m (5\' 6" )   Wt 125.5 kg   SpO2 96%   BMI 44.66 kg/m?  ? ?Physical Exam ?Awake, alert, no acute distress. ?Mucous membranes dry ?Trach collar on ?Procedures  ?Procedures ? ?ED Course / MDM  ?  ?Medical Decision Making ?Amount and/or Complexity of Data Reviewed ?Labs: ordered. ?Radiology: ordered. ? ?Risk ?Prescription drug management. ?Decision regarding hospitalization. ? ? ?Problem List Items Addressed This Visit   ? ?  ? Respiratory  ? Hypoxia - Primary  ? ?Other Visit Diagnoses   ? ? Kidney stone      ? Complicated UTI (urinary tract infection)      ? Relevant Medications  ? cefTRIAXone (ROCEPHIN) 1 g in sodium chloride 0.9 % 100 mL IVPB (Completed)  ? ?  ? ? ? ? ? ? ?  ?Merryl Hacker, MD ?07/04/21 [phone] ? ?  ?Merryl Hacker, MD ?07/04/21 [phone] ? ?

## 2021-07-04 NOTE — Anesthesia Procedure Notes (Signed)
Procedure Name: Intubation ?Date/Time: 07/04/2021 6:17 AM ?Performed by: Malynn Lucy T, CRNA ?Pre-anesthesia Checklist: Patient identified, Emergency Drugs available, Suction available, Patient being monitored and Timeout performed ?Patient Re-evaluated:Patient Re-evaluated prior to induction ?Oxygen Delivery Method: Circle system utilized ?Preoxygenation: Pre-oxygenation with 100% oxygen ?Induction Type: Tracheostomy and IV induction ?Tube type: Reinforced ?Tube size: 6.5 mm ?Number of attempts: 2 ?Airway Equipment and Method: Tracheostomy ?Placement Confirmation: positive ETCO2 and breath sounds checked- equal and bilateral ?Tube secured with: Tape ?Dental Injury: Teeth and Oropharynx as per pre-operative assessment  ?Comments: Pt intubated via existing trach site ? ? ? ? ?

## 2021-07-04 NOTE — Anesthesia Preprocedure Evaluation (Signed)
Anesthesia Evaluation  ?Patient identified by MRN, date of birth, ID band ?Patient awake ? ? ? ?Reviewed: ?Allergy & Precautions, NPO status , Patient's Chart, lab work & pertinent test results ? ?History of Anesthesia Complications ?(+) DIFFICULT AIRWAY and history of anesthetic complications ? ?Airway ?Mallampati: Trach ? ?TM Distance: >3 FB ?Neck ROM: Full ? ? ? Dental ? ?(+) Teeth Intact, Dental Advisory Given ?  ?Pulmonary ?shortness of breath, sleep apnea , COPD,  COPD inhaler and oxygen dependent, former smoker,  ?S/p total laryngectomy ?H/o lung cancer ? ?  ? ?+ decreased breath sounds ? ? ? ? ? Cardiovascular ?hypertension, Pt. on medications ?+ Peripheral Vascular Disease  ?Normal cardiovascular exam ?Rhythm:Regular Rate:Normal ? ?Echo 07/10/20: ?1. Left ventricular ejection fraction, by estimation, is 65 to 70%. The  ?left ventricle has normal function. The left ventricle has no regional  ?wall motion abnormalities. There is mild left ventricular hypertrophy.  ?Left ventricular diastolic parameters  ?are indeterminate.  ??2. Right ventricular systolic function is normal. The right ventricular  ?size is normal.  ??3. Left atrial size was severely dilated.  ??4. No evidence of mitral valve regurgitation.  ??5. The aortic valve is tricuspid. Aortic valve regurgitation is not  ?visualized. Mild aortic valve sclerosis is present, with no evidence of  ?aortic valve stenosis.  ??6. The inferior vena cava is normal in size with greater than 50%  ?respiratory variability, suggesting right atrial pressure of 3 mmHg.  ?  ?Neuro/Psych ?PSYCHIATRIC DISORDERS Anxiety Depression H/o vertebral artery aneurysm ? Neuromuscular disease   ? GI/Hepatic ?negative GI ROS, Neg liver ROS,   ?Endo/Other  ?diabetes, Type 2, Insulin DependentMorbid obesity ? Renal/GU ?Renal InsufficiencyRenal disease right ureteral stone, urinary tract infection  ? ?  ?Musculoskeletal ? ?(+) Arthritis , Fibromyalgia  - ? Abdominal ?  ?Peds ? Hematology ?negative hematology ROS ?(+)   ?Anesthesia Other Findings ?Day of surgery medications reviewed with the patient. ? Reproductive/Obstetrics ? ?  ? ? ? ? ? ? ? ? ? ? ? ? ? ?  ?  ? ? ? ? ? ? ? ? ?Anesthesia Physical ?Anesthesia Plan ? ?ASA: 4 and emergent ? ?Anesthesia Plan: General  ? ?Post-op Pain Management:   ? ?Induction: Intravenous ? ?PONV Risk Score and Plan: 3 and Treatment may vary due to age or medical condition, Dexamethasone and Ondansetron ? ?Airway Management Planned:  ? ?Additional Equipment:  ? ?Intra-op Plan:  ? ?Post-operative Plan:  ? ?Informed Consent:  ? ?Plan Discussed with:  ? ?Anesthesia Plan Comments:   ? ? ? ? ? ? ?Anesthesia Quick Evaluation ? ?

## 2021-07-04 NOTE — H&P (Signed)
?History and Physical  ? ? ?[name] IWO:[id number] DOB: [birth year] DOA: 07/03/2021 ? ?PCP: Kerin Perna, NP  ? ?Patient coming from: Home  ? ?Chief Complaint: Fever, flank pain, suprapubic pain, dysuria, increased SOB, increased mucus production  ? ?HPI: [name] is a pleasant 66 y.o. 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  Patient reports 1 to 2 weeks of suprapubic discomfort and dysuria, has also been experiencing bilateral flank pain but some of this is more chronic.  Patient also reports progressive worsening in her chronic dyspnea and increasing secretions.  She reports some fleeting relief with breathing treatments at home but has been worsening overall for the past couple days from a respiratory standpoint.  She reports having a fever to 101.3 F at home. ? ?ED Course: Upon arrival to the ED, patient is found to be afebrile and saturating low to mid 80s on room air with normal heart rate and stable blood pressure.  Blood work in the ED notable for creatinine 1.36, glucose 203, normal WBC and lactate, and normal troponin.  Urinalysis with many bacteria, large leukocytes, positive nitrites, and 6-10 WBC per hpf.  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  CT of the abdomen and pelvis is concerning for obstructing calculus in the proximal right ureter with moderate right hydronephrosis, and interval atrophy of the left kidney.  Blood and urine  cultures were collected in the ED, urology was consulted, and patient started on Rocephin. ? ?Review of Systems:  ?All other systems reviewed and apart from HPI, are negative. ? ?Past Medical History:  ?Diagnosis Date  ? Anxiety   ? Arthritis   ? COPD (chronic obstructive pulmonary disease) ([place])   ? Depression   ? Diabetes mellitus without complication ([place])   ? INSULIN DEPENDENT  ? Diabetic neuropathy ([place])   ? Difficult intubation   ? total laryngectomy  ? Fibromyalgia   ? Head and neck cancer ([place]) 04/19/2015  ? Hypertension   ? Kidney stone   ? Morbid obesity with BMI of 40.0-44.9, adult ([place])   ? Neuropathy   ? Nocturia   ? Pneumonia   ? Rash, skin   ? face  ? Renal insufficiency   ? Shortness of breath   ? Sleep apnea   ? has not used cpap past 10 yrs  ? Urge incontinence   ? ? ?Past Surgical History:  ?Procedure Laterality Date  ? AMPUTATION Left 08/25/2015  ? Procedure: 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;  Surgeon: Newt Minion, MD;  Location: [facility];  Service: Orthopedics;  Laterality: Left;  ? AMPUTATION Left 09/13/2015  ? Procedure: AMPUTATION BELOW KNEE;  Surgeon: Marybelle Killings, MD;  Location: [facility];  Service: Orthopedics;  Laterality: Left;  ? BRAIN SURGERY    ? vertebral aneurysm  ? BREAST LUMPECTOMY  1982  ? benign  ? CARPEL TUNNEL    ? CHOLECYSTECTOMY    ? CYSTOSCOPY W/ URETERAL STENT PLACEMENT Left 03/22/2014  ? Procedure: CYSTOSCOPY WITH RETROGRADE PYELOGRAM/URETERAL STENT PLACEMENT;  Surgeon: Alexis Frock,  MD;  Location: [facility];  Service: Urology;  Laterality: Left;  ? CYSTOSCOPY WITH RETROGRADE PYELOGRAM, URETEROSCOPY AND STENT PLACEMENT Left 05/11/2014  ? Procedure: CYSTOSCOPY WITH RETROGRADE PYELOGRAM, URETEROSCOPY AND STENT PLACEMENT;  Surgeon: Alexis Frock, MD;  [facility];  Service: Urology;  Laterality: Left;  ? HOLMIUM LASER APPLICATION Left 7/78/2423  ? Procedure: HOLMIUM LASER APPLICATION;  Surgeon: Alexis Frock, MD;  [facility];  Service: Urology;  Laterality: Left;  ?  LARYNGECTOMY    ? total ( stoma)  ? LEG AMPUTATION BELOW KNEE Left 09/13/2015  ? SALIVARY STONE REMOVAL    ? TONSILLECTOMY    ? TUBAL LIGATION    ? ? ?Social History:  ? reports that she quit smoking about 6 years ago. Her smoking use included cigarettes. She has a 40.00 pack-year smoking history. She has never used smokeless tobacco. She reports that she does not drink alcohol and does not use drugs. ? ?Allergies  ?Allergen Reactions  ? Ace Inhibitors Anaphylaxis and Swelling  ?  Angioedema  ? Zestril [Lisinopril] Anaphylaxis and Swelling  ?  Angioedema  ? ? ?Family History  ?Adopted: Yes  ? ? ? ?Prior to Admission medications   ?Medication Sig Start Date End Date Taking? Authorizing Provider  ?albuterol (VENTOLIN HFA) 108 (90 Base) MCG/ACT inhaler INHALE 2 PUFFS INTO THE LUNGS EVERY 4 (FOUR) HOURS AS NEEDED FOR WHEEZING OR SHORTNESS OF BREATH. 06/08/21   Collene Gobble, MD  ?apixaban (ELIQUIS) 5 MG TABS tablet Take 1 tablet (5 mg total) by mouth 2 (two) times daily. 07/12/20 10/04/20  Donne Hazel, MD  ?atorvastatin (LIPITOR) 20 MG tablet Take 20 mg by mouth daily. 04/13/20   [provider]  ?azithromycin (ZITHROMAX) 250 MG tablet Take 1 tablet (250 mg total) by mouth daily. 10/04/20   Collene Gobble, MD  ?blood glucose meter kit and supplies KIT Dispense based on patient and insurance preference. Use up to four times daily as directed. (FOR ICD-9 250.00, 250.01). 03/24/14   Bonnielee Haff, MD  ?Buprenorphine HCl-Naloxone HCl 8-2 MG FILM Place 1 Film under the tongue 3 (three) times daily.     [provider]  ?cefUROXime (CEFTIN) 250 MG tablet Take 1 tablet (250 mg total) by mouth 2 (two) times daily with a meal. 10/04/20   Byrum, Rose Fillers, MD  ?cyclobenzaprine (FLEXERIL) 10 MG tablet Take 1 tablet by mouth every 12 (twelve) hours. 05/09/20   [provider]  ?diltiazem (CARDIZEM CD) 240 MG 24 hr capsule Take 1 capsule (240 mg total) by mouth daily. 07/13/20 08/12/20  Donne Hazel, MD   ?diltiazem (CARDIZEM) 60 MG tablet Take 1 tablet (60 mg total) by mouth every 6 (six) hours for 2 doses. 07/12/20 07/13/20  Donne Hazel, MD  ?doxycycline (VIBRA-TABS) 100 MG tablet Take 1 tablet (100 mg total) by mouth 2 (two) times daily. 10/04/20   Collene Gobble, MD  ?guaiFENesin (MUCINEX) 600 MG 12 hr tablet Take 1 tablet (600 mg total) by mouth 2 (two) times daily as needed. 10/04/20   Collene Gobble, MD  ?hydrochlorothiazide (HYDRODIURIL) 25 MG tablet Take 25 mg by mouth daily.    [provider]  ?insulin aspart (NOVOLOG) 100 UNIT/ML FlexPen Inject 10 Units into the skin 3 (three) times daily with meals. 07/12/20   Donne Hazel, MD  ?insulin glargine (LANTUS SOLOSTAR) 100 UNIT/ML Solostar Pen Inject 45 Units into the skin daily. At 10AM 07/12/20   Donne Hazel, MD  ?Insulin  Pen Needle (PEN NEEDLES 3/16") 31G X 5 MM MISC Use as directed. 03/24/14   Bonnielee Haff, MD  ?pregabalin (LYRICA) 75 MG capsule Take 75 mg by mouth 2 (two) times daily.    [provider]  ?STIOLTO RESPIMAT 2.5-2.5 MCG/ACT AERS INHALE 2 PUFFS BY MOUTH INTO THE LUNGS DAILY ?Patient taking differently: Inhale 2 puffs into the lungs daily. 05/11/20   Collene Gobble, MD  ? ? ?Physical Exam: ?Vitals:  ? 07/04/21 0230 07/04/21 0300 07/04/21 0330 07/04/21 0334  ?BP: (!) 163/81 (!) 154/140 (!) 165/79   ?Pulse: 82 84 87 86  ?Resp: _0 ?Temp:      ?TempSrc:      ?SpO2: 98% 98% 97% 99%  ?Weight:      ?Height:      ? ? ?Constitutional: NAD, calm  ?Eyes: PERTLA, lids and conjunctivae normal ?ENMT: Mucous membranes are moist. Posterior pharynx clear of any exudate or lesions.   ?Neck: supple, no masses  ?Respiratory: Diminished breath sounds bilaterally with prolonged expiratory phase and wheezes. Increased WOB.  ?Cardiovascular: S1 & S2 heard, regular rate and rhythm. No JVD.  ?Abdomen: No distension, soft, suprapubic tenderness. Bowel sounds active.  ?Musculoskeletal: no clubbing / cyanosis. S/p BKA.   ?Skin: No  ulcers or drainage. Warm, dry, well-perfused. ?Neurologic: No gross facial asymmetry. EOMI. Moving all extremities. Alert and oriented.  ?Psychiatric: Very pleasant. Cooperative.  ? ? ?Labs and Imaging on Admission: I have

## 2021-07-04 NOTE — Progress Notes (Signed)
Patient seen today by trach team for consult.  No education is needed at this time.  All necessary equipment is at beside.   Will continue to follow for progression.  

## 2021-07-04 NOTE — H&P (Signed)
H&P ?Physician requesting consult: Loma Sousa Horton ? ?Chief Complaint: Right ureteral calculi, UTI with possible early sepsis ? ?History of Present Illness: 66 year old female with multiple medical core morbidities.  She is trach dependent with type 2 diabetes, COPD, intermittent oxygen dependence, laryngeal cancer who presented to the emergency department complaining of fever at home of 101.3.  She also complained of shortness of breath and dysuria with decreased urine production since Friday.  She was primarily having pelvic pain and discomfort.  She arrived with 83% on room air and shortness of breath.  She underwent a CT of the chest, abdomen, pelvis that revealed a 9 mm distal right ureteral calculus as well as a proximal 14 mm right ureteral calculus with upstream hydronephrosis.  She had significant atrophy of the left kidney that was new since the prior examination.  Right kidney was normal in size.  Creatinine is at baseline at 1.36.  She has no leukocytosis.  Urinalysis with many bacteria, large leukocyte, positive nitrite.  She has many bacteria.  States that she continues to have abdominal pain and dysuria. ? ?Past Medical History:  ?Diagnosis Date  ? Anxiety   ? Arthritis   ? COPD (chronic obstructive pulmonary disease) ([place])   ? Depression   ? Diabetes mellitus without complication ([place])   ? INSULIN DEPENDENT  ? Diabetic neuropathy ([place])   ? Difficult intubation   ? total laryngectomy  ? Fibromyalgia   ? Head and neck cancer ([place]) 04/19/2015  ? Hypertension   ? Kidney stone   ? Morbid obesity with BMI of 40.0-44.9, adult ([place])   ? Neuropathy   ? Nocturia   ? Pneumonia   ? Rash, skin   ? face  ? Renal insufficiency   ? Shortness of breath   ? Sleep apnea   ? has not used cpap past 10 yrs  ? Urge incontinence   ? ?Past Surgical History:  ?Procedure Laterality Date  ? AMPUTATION Left 08/25/2015  ? Procedure: 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;  Surgeon: Newt Minion, MD;  Location: [facility];  Service: Orthopedics;   Laterality: Left;  ? AMPUTATION Left 09/13/2015  ? Procedure: AMPUTATION BELOW KNEE;  Surgeon: Marybelle Killings, MD;  Location: [facility];  Service: Orthopedics;  Laterality: Left;  ? BRAIN SURGERY    ? vertebral aneurysm  ? BREAST LUMPECTOMY  1982  ? benign  ? CARPEL TUNNEL    ? CHOLECYSTECTOMY    ? CYSTOSCOPY W/ URETERAL STENT PLACEMENT Left 03/22/2014  ? Procedure: CYSTOSCOPY WITH RETROGRADE PYELOGRAM/URETERAL STENT PLACEMENT;  Surgeon: Alexis Frock, MD;  Location: [facility];  Service: Urology;  Laterality: Left;  ? CYSTOSCOPY WITH RETROGRADE PYELOGRAM, URETEROSCOPY AND STENT PLACEMENT Left 05/11/2014  ? Procedure: CYSTOSCOPY WITH RETROGRADE PYELOGRAM, URETEROSCOPY AND STENT PLACEMENT;  Surgeon: Alexis Frock, MD;  [facility];  Service: Urology;  Laterality: Left;  ? HOLMIUM LASER APPLICATION Left 3/61/4431  ? Procedure: HOLMIUM LASER APPLICATION;  Surgeon: Alexis Frock, MD;  [facility];  Service: Urology;  Laterality: Left;  ? LARYNGECTOMY    ? total ( stoma)  ? LEG AMPUTATION BELOW KNEE Left 09/13/2015  ? SALIVARY STONE REMOVAL    ? TONSILLECTOMY    ? TUBAL LIGATION    ? ? ?Home Medications:  ?(Not in a hospital admission) ? ?Allergies:  ?Allergies  ?Allergen Reactions  ? Ace Inhibitors Anaphylaxis and Swelling  ?  Angioedema  ? Zestril [Lisinopril] Anaphylaxis and Swelling  ?  Angioedema  ? ? ?Family History  ?  Adopted: Yes  ? ?Social History:  reports that she quit smoking about 6 years ago. Her smoking use included cigarettes. She has a 40.00 pack-year smoking history. She has never used smokeless tobacco. She reports that she does not drink alcohol and does not use drugs. ? ?ROS: ?A complete review of systems was performed.  All systems are negative except for pertinent findings as noted. ?ROS ? ? ?Physical Exam:  ?Vital signs in last 24 hours: ?Temp:  [99 ?F (37.2 ?C)] 99 ?F (37.2 ?C) (05/09 2014) ?Pulse Rate:  [78-95] 86 (05/10 0334) ?Resp:  [12-23] 15 (05/10 0334) ?BP: (133-165)/(70-140) 165/79 (05/10  0330) ?SpO2:  [84 %-100 %] 99 % (05/10 0334) ?FiO2 (%):  [28 %] 28 % (05/10 0334) ?Weight:  [125.5 kg] 125.5 kg (05/09 2016) ?General:  Alert and oriented, No acute distress ?HEENT: Normocephalic, atraumatic ?Neck: No JVD or lymphadenopathy, trach present ?Cardiovascular: Regular rate and rhythm ?Lungs: Regular rate and effort ?Abdomen: Soft, morbidly obese, nontender, nondistended, no abdominal masses ?Back: No CVA tenderness ?Extremities: No edema, left BKA ?Neurologic: Grossly intact ? ?Laboratory Data:  ?Results for orders placed or performed during the hospital encounter of 07/03/21 (from the past 24 hour(s))  ?Comprehensive metabolic panel     Status: Abnormal  ? Collection Time: 07/03/21  8:16 PM  ?Result Value Ref Range  ? Sodium 136 135 - 145 mmol/L  ? Potassium 3.7 3.5 - 5.1 mmol/L  ? Chloride 102 98 - 111 mmol/L  ? CO2 26 22 - 32 mmol/L  ? Glucose, Bld 203 (H) 70 - 99 mg/dL  ? BUN 19 8 - 23 mg/dL  ? Creatinine, Ser 1.36 (H) 0.44 - 1.00 mg/dL  ? Calcium 8.4 (L) 8.9 - 10.3 mg/dL  ? Total Protein 6.5 6.5 - 8.1 g/dL  ? Albumin 3.3 (L) 3.5 - 5.0 g/dL  ? AST 15 15 - 41 U/L  ? ALT 14 0 - 44 U/L  ? Alkaline Phosphatase 66 38 - 126 U/L  ? Total Bilirubin 0.7 0.3 - 1.2 mg/dL  ? GFR, Estimated 44 (L) >60 mL/min  ? Anion gap 8 5 - 15  ?Troponin I (High Sensitivity)     Status: None  ? Collection Time: 07/03/21  8:16 PM  ?Result Value Ref Range  ? Troponin I (High Sensitivity) 13 <18 ng/L  ?CBC with Differential     Status: Abnormal  ? Collection Time: 07/03/21  8:16 PM  ?Result Value Ref Range  ? WBC 9.1 4.0 - 10.5 K/uL  ? RBC 5.27 (H) 3.87 - 5.11 MIL/uL  ? Hemoglobin 14.9 12.0 - 15.0 g/dL  ? HCT 46.3 (H) 36.0 - 46.0 %  ? MCV 87.9 80.0 - 100.0 fL  ? MCH 28.3 26.0 - 34.0 pg  ? MCHC 32.2 30.0 - 36.0 g/dL  ? RDW 13.3 11.5 - 15.5 %  ? Platelets 191 150 - 400 K/uL  ? nRBC 0.0 0.0 - 0.2 %  ? Neutrophils Relative % 87 %  ? Neutro Abs 8.0 (H) 1.7 - 7.7 K/uL  ? Lymphocytes Relative 8 %  ? Lymphs Abs 0.7 0.7 - 4.0 K/uL  ?  Monocytes Relative 4 %  ? Monocytes Absolute 0.3 0.1 - 1.0 K/uL  ? Eosinophils Relative 0 %  ? Eosinophils Absolute 0.0 0.0 - 0.5 K/uL  ? Basophils Relative 0 %  ? Basophils Absolute 0.0 0.0 - 0.1 K/uL  ? Immature Granulocytes 1 %  ? Abs Immature Granulocytes 0.06 0.00 - 0.07 K/uL  ?Lipase,  blood     Status: None  ? Collection Time: 07/03/21  8:16 PM  ?Result Value Ref Range  ? Lipase 27 11 - 51 U/L  ?Lactic acid, plasma     Status: None  ? Collection Time: 07/03/21  8:16 PM  ?Result Value Ref Range  ? Lactic Acid, Venous 0.9 0.5 - 1.9 mmol/L  ?Urinalysis, Routine w reflex microscopic     Status: Abnormal  ? Collection Time: 07/03/21  8:32 PM  ?Result Value Ref Range  ? Color, Urine YELLOW YELLOW  ? APPearance HAZY (A) CLEAR  ? Specific Gravity, Urine 1.015 1.005 - 1.030  ? pH 8.0 5.0 - 8.0  ? Glucose, UA NEGATIVE NEGATIVE mg/dL  ? Hgb urine dipstick SMALL (A) NEGATIVE  ? Bilirubin Urine NEGATIVE NEGATIVE  ? Ketones, ur NEGATIVE NEGATIVE mg/dL  ? Protein, ur 100 (A) NEGATIVE mg/dL  ? Nitrite POSITIVE (A) NEGATIVE  ? Leukocytes,Ua LARGE (A) NEGATIVE  ? RBC / HPF 0-5 0 - 5 RBC/hpf  ? WBC, UA 6-10 0 - 5 WBC/hpf  ? Bacteria, UA MANY (A) NONE SEEN  ? Squamous Epithelial / LPF 0-5 0 - 5  ? Triple Phosphate Crystal PRESENT   ?I-Stat venous blood gas, ED     Status: Abnormal  ? Collection Time: 07/04/21  1:08 AM  ?Result Value Ref Range  ? pH, Ven 7.254 7.25 - 7.43  ? pCO2, Ven 73.6 (HH) 44 - 60 mmHg  ? pO2, Ven 134 (H) 32 - 45 mmHg  ? Bicarbonate 32.6 (H) 20.0 - 28.0 mmol/L  ? TCO2 35 (H) 22 - 32 mmol/L  ? O2 Saturation 98 %  ? Acid-Base Excess 3.0 (H) 0.0 - 2.0 mmol/L  ? Sodium 137 135 - 145 mmol/L  ? Potassium 3.8 3.5 - 5.1 mmol/L  ? Calcium, Ion 1.12 (L) 1.15 - 1.40 mmol/L  ? HCT 45.0 36.0 - 46.0 %  ? Hemoglobin 15.3 (H) 12.0 - 15.0 g/dL  ? Sample type VENOUS   ? Comment NOTIFIED PHYSICIAN   ? ?No results found for this or any previous visit (from the past 240 hour(s)). ?Creatinine: ?Recent Labs  ?  07/03/21 ?2016   ?CREATININE 1.36*  ? ?CT scan personally reviewed and is detailed in the history of present illness ? ?Impression/Assessment:  ?Right ureteral calculi ?Left renal calculi with renal atrophy ?Urinary tract infe

## 2021-07-04 NOTE — Transfer of Care (Signed)
Immediate Anesthesia Transfer of Care Note ? ?Patient: [name] ? ?Procedure(s) Performed: CYSTOSCOPY WITH STENT PLACEMENT (Right) ? ?Patient Location: PACU ? ?Anesthesia Type:General ? ?Level of Consciousness: drowsy ? ?Airway & Oxygen Therapy: Patient Spontanous Breathing and trach collar oxygen ? ?Post-op Assessment: Report given to RN and Post -op Vital signs reviewed and stable ? ?Post vital signs: Reviewed and stable ? ?Last Vitals:  ?Vitals Value Taken Time  ?BP 188/92 07/04/21 0659  ?Temp    ?Pulse 102 07/04/21 0700  ?Resp 17 07/04/21 0700  ?SpO2 99 % 07/04/21 0700  ?Vitals shown include unvalidated device data. ? ?Last Pain:  ?Vitals:  ? 07/03/21 2015  ?TempSrc:   ?PainSc: 8   ?   ? ?Patients Stated Pain Goal: 0 (07/03/21 2015) ? ?Complications: No notable events documented. ?

## 2021-07-04 NOTE — Progress Notes (Signed)
Pt arrived to [facility] room 3 alert and oriented x4. Pt uses amplifier to communicate. Amplifier in reach. Call light in reach. Bed in lowest position. Purewick hooked up. Tach set up done. Will continue to monitor pt. ?

## 2021-07-04 NOTE — Op Note (Signed)
Operative Note ? ?Preoperative diagnosis:  ?1.  Right ureteral calculus with urinary tract infection ? ?Post operative diagnosis: ?1.  Right ureteral calculus with urinary tract infection ? ?Procedure(s): ?1.  Cystoscopy with right retrograde pyelogram and right ureteral stent placement ? ?Surgeon: Link Snuffer, MD ? ?Assistants: None ? ?Anesthesia: General ? ?Complications: None immediate ? ?EBL: Minimal ? ?Specimens: ?1.  None ? ?Drains/Catheters: ?1.  6 X 26 double-J ureteral stent ? ?Intraoperative findings: 1.  Normal urethra and bladder 2.  Right retrograde pyelogram revealed a filling defect at the level of the stone with upstream hydroureteronephrosis ? ?Indication: 66 year old female with a right obstructing ureteral calculus and UTI.  She presents for urgent ureteral stent placement. ? ?Description of procedure: ? ?The patient was identified and consent was obtained.  The patient was taken to the operating room and placed in the supine position.  The patient was placed under general anesthesia.  Perioperative antibiotics were administered.  The patient was placed in dorsal lithotomy.  Patient was prepped and draped in a standard sterile fashion and a timeout was performed. ? ?A 21 French rigid cystoscope was advanced into the urethra and into the bladder.  The right distal most portion of the ureter was cannulated with an open-ended ureteral catheter.  Retrograde pyelogram was performed with the findings noted above.  A sensor wire was then advanced up to the kidney under fluoroscopic guidance.  A 6 X 26 double-J ureteral stent was advanced up to the kidney under fluoroscopic guidance.  The wire was withdrawn and fluoroscopy confirmed good proximal placement and direct visualization confirmed a good coil within the bladder.  The bladder was drained and the scope withdrawn.  This concluded the operation.  Patient tolerated procedure well and was guarded but stable postoperatively. ? ?Plan: She will be  admitted to the hospitalist service.  Continue IV antibiotics until culture returns.  Plan for ureteroscopy in 1 to 2 weeks or so. ? ? ?

## 2021-07-04 NOTE — TOC Initial Note (Addendum)
Transition of Care (TOC) - Initial/Assessment Note  ? ? ?Patient Details  ?Name: [name] ?MRN: [id number] ?Date of Birth: [birth year] ? ?Transition of Care (TOC) CM/SW Contact:    ?Marilu Favre, RN ?Phone Number: ?07/04/2021, 2:00 PM ? ?Clinical Narrative:                 ?Spoke to patient at bedside. Confirmed face sheet information. Patient from home with husband.  ? ?PCP is Lily Peer at [facility]. ? ?Patient does not use oxygen or suction at home. Patient has a laryngectomy tube and receives supplies through the mail.  ? ? ? ? ? ?Transition of Care Department [facility]) has reviewed patient and no TOC needs have been identified at this time. We will continue to monitor patient advancement through interdisciplinary progression rounds. If new patient transition needs arise, please place a TOC consult. ?  ?Expected Discharge Plan: Home/Self Care ?Barriers to Discharge: Continued Medical Work up ? ? ?Patient Goals and CMS Choice ?Patient states their goals for this hospitalization and ongoing recovery are:: to return to home ?CMS Medicare.gov Compare Post Acute Care list provided to:: Patient ?  ? ?Expected Discharge Plan and Services ?Expected Discharge Plan: Home/Self Care ?  ?Discharge Planning Services: CM Consult ?  ?Living arrangements for the past 2 months: [place] ?                ?DME Arranged: N/A ?  ?  ?  ?  ?HH Arranged: NA ?  ?  ?  ?  ? ?Prior Living Arrangements/Services ?Living arrangements for the past 2 months: [place] ?Lives with:: Spouse ?Patient language and need for interpreter reviewed:: Yes ?       ?Need for Family Participation in Patient Care: Yes (Comment) ?Care giver support system in place?: Yes (comment) ?Current home services: DME ?  ? ?Activities of Daily Living ?  ?  ? ?Permission Sought/Granted ?  ?Permission granted to share information with : No ?   ?   ?   ?   ? ?Emotional Assessment ?Appearance::  Appears stated age ?Attitude/Demeanor/Rapport: Engaged ?Affect (typically observed): Accepting ?Orientation: : Oriented to Self, Oriented to Place, Oriented to  Time, Oriented to Situation ?Alcohol / Substance Use: Not Applicable ?Psych Involvement: No (comment) ? ?Admission diagnosis:  Kidney stone [N20.0] ?Hypoxia [R09.02] ?Complicated UTI (urinary tract infection) [N39.0] ?Obstructive pyelonephritis [N11.1] ?Patient Active Problem List  ? Diagnosis Date Noted  ? Obstructive pyelonephritis 07/04/2021  ? Acute on chronic respiratory failure with hypoxia and hypercapnia ([place]) 07/04/2021  ? Ascending aorta dilatation (HCC) 07/04/2021  ? PAF (paroxysmal atrial fibrillation) ([place]) 07/11/2020  ? 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 Acute hypoxemic respiratory failure ([place]) 07/08/2020  ? Type 2 diabetes mellitus ([place]) 07/08/2020  ? Hypertensive urgency 07/08/2020  ? Hyperlipidemia 07/08/2020  ? Pressure injury of skin 07/08/2020  ? COPD exacerbation ([place]) 07/07/2020  ? Fever and chills   ? Hypoxia   ? Abnormal CT of the chest 11/28/2017  ? Non-small cell cancer of left lung ([place]) 10/17/2017  ? Laryngeal cancer ([place]) 10/17/2017  ? Post-operative pain   ? Constipation due to pain medication   ? Neuropathic pain   ? Muscle spasm   ? SIRS (systemic inflammatory response syndrome) ([place]) 09/18/2015  ? Labile blood pressure   ? DM type 2 with diabetic peripheral neuropathy ([place])   ? Abnormality of gait   ? Unilateral complete  BKA ([place]) 09/15/2015  ? Status post below knee amputation of left lower extremity   ? Acute bronchitis   ? Primary osteoarthritis of right knee   ? Infection   ? Sepsis ([place]) 09/09/2015  ? Wound infection 09/09/2015  ? Diabetic ulcer of foot with necrosis of bone ([place]) 09/09/2015  ? Type 2 diabetes mellitus with circulatory disorder ([place])   ? Osteomyelitis of foot, acute, left 08/25/2015  ? Diabetic foot infection ([place]) 04/19/2015  ? Diabetic foot ulcer ([place]) 04/19/2015  ? Stage 3b chronic kidney  disease (CKD) ([place]) 04/19/2015  ? Head and neck cancer ([place]) 04/19/2015  ? Right hip pain 04/19/2015  ? Infection of supraglottis (throat) 09/16/2014  ? Incidental lung nodule, less than or equal to 9mm 09/16/2014  ? Obstructive uropathy 03/22/2014  ? COPD with acute exacerbation ([place]) 03/20/2014  ? Controlled type 2 diabetes mellitus with diabetic nephropathy ([place]) 03/20/2014  ? Lice infested hair 27/74/1287  ? Former smoker 08/05/2012  ? TINEA CORPORIS 09/01/2009  ? DEPRESSION 08/11/2009  ? Chronic pain syndrome 04/15/2007  ? HYPERLIPIDEMIA NEC/NOS 11/02/2006  ? Peripheral vascular disease ([place]) 11/02/2006  ? MORBID OBESITY 10/28/2006  ? Obstructive sleep apnea 10/28/2006  ? POLYNEUROPATHY 10/28/2006  ? Benign essential HTN 10/28/2006  ? OSTEOARTHRITIS, KNEES, BILATERAL 10/28/2006  ? PROTEINURIA 11/25/2005  ? ?PCP:  Kerin Perna, NP ?Pharmacy:   ?[id number] [address], [place] ?[place] ?[place] [address] ?Phone: [phone] Fax: [phone] ? ?[place], [place] - [address] ?[address] ?[place] [address] ?Phone: [phone] Fax: [phone] ? ?[facility] ?[address] ?[place] [address] ?Phone: [phone] Fax: [phone] ? ? ? ? ?Social Determinants of Health (SDOH) Interventions ?  ? ?Readmission Risk Interventions ?   ? View : No data to display.  ?  ?  ?  ? ? ? ?

## 2021-07-04 NOTE — Progress Notes (Signed)
This is a pleasant 66 year old lady with multiple comorbidities as listed in H&P was actually admitted with obstructive uropathy, right stone in the ureter as well as UTI.  Started on antibiotics.  Seen by urology and underwent cystoscopy with right retrograde pyelogram and right ureteral stent placement.  Seen and examined after the procedure.  She is feeling better.  No pain.  No other complaint.  Fully alert and oriented.  We will follow urine culture.  Continue current antibiotics in the meantime.  Lungs clear to auscultation during my evaluation.  She is on prednisone for COPD exacerbation we will continue that.  Due to prednisone, she is hyperglycemic.  She appears to be taking 45 units of long-acting insulin in the morning at home.  She has been started on 20 units, that is supposed to be started tonight but I will change the timing to start now and increase the dose to 30 units.  Continue SSI. ?

## 2021-07-04 NOTE — ED Notes (Signed)
Pt transported to CT with this RN on bedside monitor and on vent- flow by mask at 6L per verbal confirmation from RT ?

## 2021-07-04 NOTE — ED Notes (Signed)
Pt placed on hospital bed. Provided with warm blankets. Bed linen and chucks pad change ?

## 2021-07-04 NOTE — Anesthesia Postprocedure Evaluation (Signed)
Anesthesia Post Note ? ?Patient: [name] ? ?Procedure(s) Performed: CYSTOSCOPY WITH STENT PLACEMENT (Right) ? ?  ? ?Patient location during evaluation: PACU ?Anesthesia Type: General ?Level of consciousness: awake and alert ?Pain management: pain level controlled ?Vital Signs Assessment: post-procedure vital signs reviewed and stable ?Respiratory status: spontaneous breathing, nonlabored ventilation, respiratory function stable and patient connected to nasal cannula oxygen ?Cardiovascular status: blood pressure returned to baseline, stable and tachycardic ?Postop Assessment: no apparent nausea or vomiting ?Anesthetic complications: no ? ? ?No notable events documented. ? ?Last Vitals:  ?Vitals:  ? 07/04/21 0700 07/04/21 0715  ?BP: (!) 188/92 (!) 165/68  ?Pulse: (!) 102 (!) 116  ?Resp: 17 (!) 24  ?Temp: 37.6 ?C   ?SpO2: 99% 97%  ?  ?Last Pain:  ?Vitals:  ? 07/04/21 0700  ?TempSrc:   ?PainSc: 0-No pain  ? ? ?  ?  ?  ?  ?  ?  ? ?[place] ? ? ? ? ?

## 2021-07-05 ENCOUNTER — Encounter (HOSPITAL_COMMUNITY): Payer: Self-pay | Admitting: Urology

## 2021-07-05 LAB — GLUCOSE, CAPILLARY
Glucose-Capillary: 109 mg/dL — ABNORMAL HIGH (ref 70–99)
Glucose-Capillary: 137 mg/dL — ABNORMAL HIGH (ref 70–99)
Glucose-Capillary: 145 mg/dL — ABNORMAL HIGH (ref 70–99)
Glucose-Capillary: 203 mg/dL — ABNORMAL HIGH (ref 70–99)
Glucose-Capillary: 213 mg/dL — ABNORMAL HIGH (ref 70–99)
Glucose-Capillary: 227 mg/dL — ABNORMAL HIGH (ref 70–99)
Glucose-Capillary: 241 mg/dL — ABNORMAL HIGH (ref 70–99)

## 2021-07-05 LAB — CBC WITH DIFFERENTIAL/PLATELET
Abs Immature Granulocytes: 0.03 10*3/uL (ref 0.00–0.07)
Basophils Absolute: 0 10*3/uL (ref 0.0–0.1)
Basophils Relative: 0 %
Eosinophils Absolute: 0.1 10*3/uL (ref 0.0–0.5)
Eosinophils Relative: 1 %
HCT: 42.4 % (ref 36.0–46.0)
Hemoglobin: 13.5 g/dL (ref 12.0–15.0)
Immature Granulocytes: 0 %
Lymphocytes Relative: 21 %
Lymphs Abs: 1.4 10*3/uL (ref 0.7–4.0)
MCH: 27.8 pg (ref 26.0–34.0)
MCHC: 31.8 g/dL (ref 30.0–36.0)
MCV: 87.2 fL (ref 80.0–100.0)
Monocytes Absolute: 0.5 10*3/uL (ref 0.1–1.0)
Monocytes Relative: 7 %
Neutro Abs: 4.8 10*3/uL (ref 1.7–7.7)
Neutrophils Relative %: 71 %
Platelets: 183 10*3/uL (ref 150–400)
RBC: 4.86 MIL/uL (ref 3.87–5.11)
RDW: 13.4 % (ref 11.5–15.5)
WBC: 6.9 10*3/uL (ref 4.0–10.5)
nRBC: 0 % (ref 0.0–0.2)

## 2021-07-05 LAB — BASIC METABOLIC PANEL
Anion gap: 4 — ABNORMAL LOW (ref 5–15)
BUN: 15 mg/dL (ref 8–23)
CO2: 31 mmol/L (ref 22–32)
Calcium: 8.1 mg/dL — ABNORMAL LOW (ref 8.9–10.3)
Chloride: 104 mmol/L (ref 98–111)
Creatinine, Ser: 0.93 mg/dL (ref 0.44–1.00)
GFR, Estimated: >60 mL/min (ref >60)
Glucose, Bld: 94 mg/dL (ref 70–99)
Potassium: 3.2 mmol/L — ABNORMAL LOW (ref 3.5–5.1)
Sodium: 139 mmol/L (ref 135–145)

## 2021-07-05 LAB — HEMOGLOBIN A1C
Hgb A1c MFr Bld: 6.6 % — ABNORMAL HIGH (ref 4.8–5.6)
Mean Plasma Glucose: 142.72 mg/dL

## 2021-07-05 MED ORDER — POTASSIUM CHLORIDE 20 MEQ PO PACK: 40.0000 meq | PACK | Freq: Once | ORAL | Status: DC

## 2021-07-05 MED ORDER — POTASSIUM CHLORIDE 20 MEQ PO PACK
40.0000 meq | PACK | Freq: Once | ORAL | Status: AC
  Administered 2021-07-05: 40 meq via ORAL
  Filled 2021-07-05: qty 2

## 2021-07-05 MED ORDER — APIXABAN 5 MG PO TABS
5.0000 mg | ORAL_TABLET | Freq: Two times a day (BID) | ORAL | Status: DC
  Administered 2021-07-05 – 2021-07-06 (×3): 5 mg via ORAL
  Filled 2021-07-05 (×3): qty 1

## 2021-07-05 NOTE — Progress Notes (Signed)
?PROGRESS NOTE ? ? ? ?[name]  VVZ:[id number] DOB: Jul 22, 1955 DOA: 07/03/2021 ?PCP: Delford Field, FNP ? ? ?Brief Narrative:  ?This is a pleasant 66 year old lady with multiple comorbidities as listed in H&P was actually admitted with obstructive uropathy, right stone in the ureter as well as UTI.  Started on antibiotics.  Seen by urology and underwent cystoscopy with right retrograde pyelogram and right ureteral stent placement ? ?Assessment & Plan: ?  ?Principal Problem: ?  Obstructive pyelonephritis ?Active Problems: ?  Obstructive sleep apnea ?  COPD with acute exacerbation ([place]) ?  Stage 3b chronic kidney disease (CKD) ([place]) ?  Type 2 diabetes mellitus with circulatory disorder ([place]) ?  PAF (paroxysmal atrial fibrillation) ([place]) ?  Acute on chronic respiratory failure with hypoxia and hypercapnia (HCC) ?  Ascending aorta dilatation (HCC) ? ?UTI/right infected ureteral nephrolithiasis:Seen by urology and underwent cystoscopy with right retrograde pyelogram and right ureteral stent placement.  Urine culture is growing Proteus mirabilis, final sensitivities are pending.  Would be ideal to know the sensitivities before discharge.  Continue current empiric antibiotic in the meantime. ? ?Chronic hypoxic and hypercapnic respiratory failure/acute COPD exacerbation: No more wheezes on exam.  Continue and complete 5-day course of prednisone.  Continue bronchodilators. ? ?PAF:  In SR on admission  ?- CHADS-VASc at least 2 (DM, gender) ?- She has not taken Eliquis in ~1 month .  Will resume Eliquis. ? ?Type 2 diabetes mellitus: Blood sugar very labile, continue Semglee 30 units and SSI. ? ?CKD stage IIIa: Stable at baseline. ? ?Hypokalemia: We will replace. ? ?Ascending aorta dilatation: Noted incidentally on CT. measuring 4.2 cm in ?greatest dimension, stable since prior examination. Recommend annual ?imaging followup by CTA or MRA. This recommendation follows 2010 ?ACCF/AHA/AATS/ACR/ASA/SCA/SCAI/SIR/STS/SVM  Guidelines for the ?Diagnosis and Management of Patients with Thoracic Aortic Disease. ?Circulation. 2010; 121: J449-E010. Aortic aneurysm NOS (ICD10-I71.9) ?  ? ?DVT prophylaxis: SCDs Start: 07/04/21 0545, starting on Eliquis ?  Code Status: Full Code  ?Family Communication:  None present at bedside.  Plan of care discussed with patient in length and he/she verbalized understanding and agreed with it. ? ?Status is: Inpatient ?Remains inpatient appropriate because: Awaiting final urine culture and sensitivities to tailor antibiotic appropriately. ? ? ?Estimated body mass index is 40.81 kg/m? as calculated from the following: ?  Height as of this encounter: 5\' 6"  (1.676 m). ?  Weight as of this encounter: 114.7 kg. ? ?Pressure Injury 07/08/20 Foot Anterior;Right Stage 2 -  Partial thickness loss of dermis presenting as a shallow open injury with a red, pink wound bed without slough. (Active)  ?07/08/20 0000  ?Location: Foot  ?Location Orientation: Anterior;Right  ?Staging: Stage 2 -  Partial thickness loss of dermis presenting as a shallow open injury with a red, pink wound bed without slough.  ?Wound Description (Comments):   ?Present on Admission: Yes  ? ?Nutritional Assessment: ?Body mass index is 40.81 kg/m?Marland KitchenMarland Kitchen ?Seen by dietician.  I agree with the assessment and plan as outlined below: ?Nutrition Status: ?  ?  ?  ? ?. ?Skin Assessment: ?I have examined the patient's skin and I agree with the wound assessment as performed by the wound care RN as outlined below: ?Pressure Injury 07/08/20 Foot Anterior;Right Stage 2 -  Partial thickness loss of dermis presenting as a shallow open injury with a red, pink wound bed without slough. (Active)  ?07/08/20 0000  ?Location: Foot  ?Location Orientation: Anterior;Right  ?Staging: Stage 2 -  Partial thickness loss  of dermis presenting as a shallow open injury with a red, pink wound bed without slough.  ?Wound Description (Comments):   ?Present on Admission: Yes   ? ? ?Consultants:  ?Urology ? ?Procedures:  ?As above ? ?Antimicrobials:  ?Anti-infectives (From admission, onward)  ? ? Start     Dose/Rate Route Frequency Ordered Stop  ? 07/04/21 2200  cefTRIAXone (ROCEPHIN) 1 g in sodium chloride 0.9 % 100 mL IVPB       ? 1 g ?200 mL/hr over 30 Minutes Intravenous Every 24 hours 07/04/21 0546 07/09/21 2159  ? 07/03/21 2245  cefTRIAXone (ROCEPHIN) 1 g in sodium chloride 0.9 % 100 mL IVPB       ? 1 g ?200 mL/hr over 30 Minutes Intravenous  Once 07/03/21 2240 07/04/21 0057  ? ?  ?  ? ? ?Subjective: ?Seen and examined.  She has no complaints.  No shortness of breath.  No pain. ? ?Objective: ?Vitals:  ? 07/05/21 0426 07/05/21 0709 07/05/21 0821 07/05/21 1214  ?BP:   132/67   ?Pulse:  87 83 85  ?Resp:  18 18 17   ?Temp:   98.5 ?F (36.9 ?C)   ?TempSrc:   Oral   ?SpO2:  95% 97% 97%  ?Weight: 114.7 kg     ?Height:      ? ? ?Intake/Output Summary (Last 24 hours) at 07/05/2021 1325 ?Last data filed at 07/05/2021 0030 ?Gross per 24 hour  ?Intake 100 ml  ?Output 1250 ml  ?Net -1150 ml  ? ?Filed Weights  ? 07/03/21 2016 07/05/21 0426  ?Weight: 125.5 kg 114.7 kg  ? ? ?Examination: ? ?General exam: Appears calm and comfortable, morbidly obese ?Respiratory system: Diminished breath sounds due to body habitus, no wheezes or crackles or rhonchi. Respiratory effort normal.  Tracheostomy in place. ?Cardiovascular system: S1 & S2 heard, RRR. No JVD, murmurs, rubs, gallops or clicks. No pedal edema. ?Gastrointestinal system: Abdomen is nondistended, soft and nontender. No organomegaly or masses felt. Normal bowel sounds heard. ?Central nervous system: Alert and oriented. No focal neurological deficits. ?Extremities: Left BKA ?Skin: No rashes, lesions or ulcers ?Psychiatry: Judgement and insight appear normal. Mood & affect appropriate.  ? ? ?Data Reviewed: I have personally reviewed following labs and imaging studies ? ?CBC: ?Recent Labs  ?Lab 07/03/21 ?2016 07/04/21 ?0108 07/05/21 ?0342  ?WBC 9.1  --   6.9  ?NEUTROABS 8.0*  --  4.8  ?HGB 14.9 15.3* 13.5  ?HCT 46.3* 45.0 42.4  ?MCV 87.9  --  87.2  ?PLT 191  --  183  ? ?Basic Metabolic Panel: ?Recent Labs  ?Lab 07/03/21 ?2016 07/04/21 ?0108 07/05/21 ?0342  ?NA 136 137 139  ?K 3.7 3.8 3.2*  ?CL 102  --  104  ?CO2 26  --  31  ?GLUCOSE 203*  --  94  ?BUN 19  --  15  ?CREATININE 1.36*  --  0.93  ?CALCIUM 8.4*  --  8.1*  ? ?GFR: ?Estimated Creatinine Clearance: 79.7 mL/min (by C-G formula based on SCr of 0.93 mg/dL). ?Liver Function Tests: ?Recent Labs  ?Lab 07/03/21 ?2016  ?AST 15  ?ALT 14  ?ALKPHOS 66  ?BILITOT 0.7  ?PROT 6.5  ?ALBUMIN 3.3*  ? ?Recent Labs  ?Lab 07/03/21 ?2016  ?LIPASE 27  ? ?No results for input(s): AMMONIA in the last 168 hours. ?Coagulation Profile: ?No results for input(s): INR, PROTIME in the last 168 hours. ?Cardiac Enzymes: ?No results for input(s): CKTOTAL, CKMB, CKMBINDEX, TROPONINI in the last 168 hours. ?BNP (  last 3 results) ?No results for input(s): PROBNP in the last 8760 hours. ?HbA1C: ?Recent Labs  ?  07/05/21 ?0342  ?HGBA1C 6.6*  ? ?CBG: ?Recent Labs  ?Lab 07/04/21 ?1934 07/05/21 ?0033 07/05/21 ?0425 07/05/21 ?5916 07/05/21 ?1102  ?GLUCAP 168* [place] ?Lipid Profile: ?No results for input(s): CHOL, HDL, LDLCALC, TRIG, CHOLHDL, LDLDIRECT in the last 72 hours. ?Thyroid Function Tests: ?No results for input(s): TSH, T4TOTAL, FREET4, T3FREE, THYROIDAB in the last 72 hours. ?Anemia Panel: ?No results for input(s): VITAMINB12, FOLATE, FERRITIN, TIBC, IRON, RETICCTPCT in the last 72 hours. ?Sepsis Labs: ?Recent Labs  ?Lab 07/03/21 ?2016  ?LATICACIDVEN 0.9  ? ? ?Recent Results (from the past 240 hour(s))  ?Blood culture (routine x 2)     Status: None (Preliminary result)  ? Collection Time: 07/03/21  8:16 PM  ? Specimen: BLOOD  ?Result Value Ref Range Status  ? Specimen Description BLOOD SITE NOT SPECIFIED  Final  ? Special Requests   Final  ?  BOTTLES DRAWN AEROBIC AND ANAEROBIC Blood Culture adequate volume  ? Culture   Final   ?  NO GROWTH 2 DAYS ?Performed at [facility], [place] [address], [place], [place] [address] ?  ? Report Status PENDING  Incomplete  ?Blood culture (routine x 2)     Status: None (Preliminary result)  ? Collection [employer]

## 2021-07-06 ENCOUNTER — Other Ambulatory Visit (HOSPITAL_COMMUNITY): Payer: Self-pay

## 2021-07-06 LAB — BASIC METABOLIC PANEL
Anion gap: 9 (ref 5–15)
BUN: 22 mg/dL (ref 8–23)
CO2: 29 mmol/L (ref 22–32)
Calcium: 8.5 mg/dL — ABNORMAL LOW (ref 8.9–10.3)
Chloride: 100 mmol/L (ref 98–111)
Creatinine, Ser: 1.13 mg/dL — ABNORMAL HIGH (ref 0.44–1.00)
GFR, Estimated: 55 mL/min — ABNORMAL LOW (ref >60)
Glucose, Bld: 127 mg/dL — ABNORMAL HIGH (ref 70–99)
Potassium: 3.6 mmol/L (ref 3.5–5.1)
Sodium: 138 mmol/L (ref 135–145)

## 2021-07-06 LAB — CBC WITH DIFFERENTIAL/PLATELET
Abs Immature Granulocytes: 0.02 10*3/uL (ref 0.00–0.07)
Basophils Absolute: 0 10*3/uL (ref 0.0–0.1)
Basophils Relative: 0 %
Eosinophils Absolute: 0 10*3/uL (ref 0.0–0.5)
Eosinophils Relative: 0 %
HCT: 43.5 % (ref 36.0–46.0)
Hemoglobin: 13.6 g/dL (ref 12.0–15.0)
Immature Granulocytes: 0 %
Lymphocytes Relative: 23 %
Lymphs Abs: 1.4 10*3/uL (ref 0.7–4.0)
MCH: 27.5 pg (ref 26.0–34.0)
MCHC: 31.3 g/dL (ref 30.0–36.0)
MCV: 88.1 fL (ref 80.0–100.0)
Monocytes Absolute: 0.5 10*3/uL (ref 0.1–1.0)
Monocytes Relative: 8 %
Neutro Abs: 4.2 10*3/uL (ref 1.7–7.7)
Neutrophils Relative %: 69 %
Platelets: 214 10*3/uL (ref 150–400)
RBC: 4.94 MIL/uL (ref 3.87–5.11)
RDW: 13.2 % (ref 11.5–15.5)
WBC: 6.1 10*3/uL (ref 4.0–10.5)
nRBC: 0 % (ref 0.0–0.2)

## 2021-07-06 LAB — URINE CULTURE: Culture: 40000 — AB

## 2021-07-06 LAB — GLUCOSE, CAPILLARY
Glucose-Capillary: 107 mg/dL — ABNORMAL HIGH (ref 70–99)
Glucose-Capillary: 117 mg/dL — ABNORMAL HIGH (ref 70–99)
Glucose-Capillary: 155 mg/dL — ABNORMAL HIGH (ref 70–99)

## 2021-07-06 MED ORDER — INSULIN ASPART 100 UNIT/ML IJ SOLN
0.0000 [IU] | Freq: Three times a day (TID) | INTRAMUSCULAR | Status: DC
  Administered 2021-07-06: 2 [IU] via SUBCUTANEOUS

## 2021-07-06 MED ORDER — APIXABAN 5 MG PO TABS
5.0000 mg | ORAL_TABLET | Freq: Two times a day (BID) | ORAL | 0 refills | Status: DC
  Filled 2021-07-06: qty 60, 30d supply, fill #0

## 2021-07-06 MED ORDER — PREDNISONE 50 MG PO TABS
50.0000 mg | ORAL_TABLET | Freq: Every day | ORAL | 0 refills | Status: AC
  Filled 2021-07-06: qty 2, 2d supply, fill #0

## 2021-07-06 MED ORDER — IPRATROPIUM-ALBUTEROL 0.5-2.5 (3) MG/3ML IN SOLN: 3.0000 mL | Freq: Two times a day (BID) | RESPIRATORY_TRACT | Status: DC

## 2021-07-06 MED ORDER — LEVOFLOXACIN 750 MG PO TABS
750.0000 mg | ORAL_TABLET | Freq: Every day | ORAL | 0 refills | Status: AC
  Filled 2021-07-06: qty 7, 7d supply, fill #0

## 2021-07-06 MED ORDER — CEFDINIR 300 MG PO CAPS
300.0000 mg | ORAL_CAPSULE | Freq: Two times a day (BID) | ORAL | 0 refills | Status: DC
  Filled 2021-07-06: qty 14, 7d supply, fill #0

## 2021-07-06 NOTE — Discharge Summary (Addendum)
[facility] Discharge Summary  ?[name] BLT:[id number] DOB: August 30, 1955 DOA: 07/03/2021 ? ?PCP: Delford Field, FNP ? ?Admit date: 07/03/2021 ?Discharge date: 07/06/2021 ?30 Day Unplanned Readmission Risk Score   ? ?Flowsheet Row ED to Hosp-Admission (Current) from 07/03/2021 in [place]  ?30 Day Unplanned Readmission Risk Score (%) 12.73 Filed at 07/06/2021 0801  ? ?  ? ? This score is the patient's risk of an unplanned readmission within 30 days of being discharged (0 -100%). The score is based on dignosis, age, lab data, medications, orders, and past utilization.   ?Low:  0-14.9   Medium: 15-21.9   High: 22-29.9   Extreme: 30 and above ? ?  ? ?  ? ? ? ?Admitted From: Home ?Disposition: Home ? ?Recommendations for Outpatient Follow-up:  ?Follow up with PCP in 1-2 weeks ?Please obtain BMP/CBC in one week ?Follow-up with urology in 1 to 2 weeks, if you do not hear from urology then please call them and make an appointment. ?Please follow up with your PCP on the following pending results: ?Unresulted Labs (From admission, onward)  ? ?  Start     Ordered  ? 07/05/21 0076  Basic metabolic panel  Daily,   R     ? 07/04/21 0546  ? 07/05/21 0500  CBC with Differential/Platelet  Daily,   R     ? 07/04/21 1253  ? 07/04/21 0544  Expectorated Sputum Assessment w Gram Stain, Rflx to Resp Cult  (COPD / Pneumonia / Cellulitis / Lower Extremity Wound)  Once,   R       ? 07/04/21 0546  ? ?  ?  ? ?  ?  ? ? ?Home Health: None ?Equipment/Devices: None ? ?Discharge Condition: Stable ?CODE STATUS: Full code ?Diet recommendation: Cardiac ? ?Subjective: Seen and examined.  She has no complaints. ? 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  Started on antibiotics.  Seen by urology and underwent cystoscopy with right retrograde pyelogram and right ureteral stent placement.  Details below. ?  ?UTI/right infected ureteral nephrolithiasis/right obstructive pyelonephritis:Seen by urology and underwent cystoscopy with right retrograde pyelogram and right ureteral stent placement on 07/04/2021.  Urine culture is growing Proteus mirabilis, final sensitivities are pending.  Would be ideal to know the sensitivities before discharge however patient has remained fairly stable with no fever so she will be discharged on 7 more days of cefdinir to complete the course of 10 days as she has received 3 days of IV antibiotics here. ? ?Addendum: Patient's urine culture sensitivities are just posted and it appears that her Proteus mirabilis is resistant to cephalosporins and ampicillin and it is sensitive to Bactrim and fluoroquinolones.  Due to slightly elevated creatinine, will not prescribe Bactrim but instead prescribe Levaquin for 7 days.  Cefdinir discontinued. ?  ?Chronic hypoxic and hypercapnic respiratory failure/acute COPD exacerbation: Minimal end expiratory wheezes bilaterally, per patient, she usually has wheezes in the morning, she has received 3 days of Solu-Medrol here, will discharge on 2 more days of prednisone. ?  ?PAF:  In SR on admission  ?- CHADS-VASc at least 2 (DM, gender) ?-  She has not taken Eliquis in ~1 month .  Will resume Eliquis. ?  ?Type 2 diabetes mellitus: Blood sugar very labile, resume home medications. ?  ?CKD stage IIIa: Stable at baseline. ?  ?Hypokalemia: Resolved. ?  ?Ascending aorta dilatation: Noted incidentally on CT. measuring 4.2 cm in ?greatest dimension, stable since prior examination. Recommend annual ?imaging followup by CTA or MRA. This recommendation follows  2010 ?ACCF/AHA/AATS/ACR/ASA/SCA/SCAI/SIR/STS/SVM Guidelines for the ?Diagnosis and Management of Patients with Thoracic Aortic Disease. ?Circulation. 2010; 121: K998-P382. Aortic aneurysm NOS (ICD10-I71.9).  Will defer to PCP to arrange follow-up CT. ? ?Discharge plan was discussed with patient and/or family member and they verbalized understanding and agreed with it.  ?Discharge Diagnoses:  ?Active Problems: ?  Obstructive sleep apnea ?  COPD with acute exacerbation ([place]) ?  Stage 3b chronic kidney disease (CKD) ([place]) ?  Type 2 diabetes mellitus with circulatory disorder ([place]) ?  PAF (paroxysmal atrial fibrillation) ([place]) ?  Obstructive pyelonephritis ?  Acute on chronic respiratory failure with hypoxia and hypercapnia (HCC) ?  Ascending aorta dilatation (HCC) ? ? ? ?Discharge Instructions ? ? ?Allergies as of 07/06/2021   ? ?   Reactions  ? Ace Inhibitors Anaphylaxis, Swelling  ? Angioedema  ? Zestril [lisinopril] Anaphylaxis, Swelling  ? Angioedema  ? ?  ? ?  ?Medication List  ?  ? ?STOP taking these medications   ? ?diltiazem 60 MG tablet ?Commonly known as: CARDIZEM ?  ?ibuprofen 800 MG tablet ?Commonly known as: ADVIL ?  ? ?  ? ?TAKE these medications   ? ?albuterol 108 (90 Base) MCG/ACT inhaler ?Commonly known as: VENTOLIN HFA ?INHALE 2 PUFFS INTO THE LUNGS EVERY 4 (FOUR) HOURS AS NEEDED FOR WHEEZING OR SHORTNESS OF BREATH. ?  ?atorvastatin 20 MG tablet ?Commonly known as: LIPITOR ?Take 20 mg by mouth daily. ?  ?Buprenorphine HCl-Naloxone HCl 8-2 MG Film ?Place 1 Film under the tongue 3 (three) times daily. ?  ?Cartia XT 240 MG 24 hr capsule ?Generic drug: diltiazem ?Take 1 capsule (240 mg total) by mouth daily. ?  ?cyclobenzaprine 10 MG tablet ?Commonly known as: FLEXERIL ?Take 1 tablet by mouth every 12 (twelve) hours. ?  ?Eliquis 5 MG Tabs tablet ?Generic drug: apixaban ?Take 1 tablet (5 mg total) by mouth 2 (two) times daily. ?  ?insulin aspart 100 UNIT/ML FlexPen ?Commonly known as: NOVOLOG ?Inject 10  Units into the skin 3 (three) times daily with meals. ?  ?Lantus SoloStar 100 UNIT/ML Solostar Pen ?Generic drug: insulin glargine ?Inject 45 Units into the skin daily. At 10AM ?  ?levofloxacin 750 MG tablet ?Commonly known as: Levaquin ?Take 1 tablet (750 mg total) by mouth daily for 7 days. ?  ?Pen Needles 3/16" 31G X 5 MM Misc ?Use as directed. ?  ?predniSONE 50 MG tablet ?Commonly known as: DELTASONE ?Take 1 tablet (50 mg total) by mouth daily with breakfast for 2 days. ?Start taking on: Jul 07, 2021 ?  ?Stiolto Respimat 2.5-2.5 MCG/ACT Aers ?Generic drug: Tiotropium Bromide-Olodaterol ?INHALE 2 PUFFS BY MOUTH INTO THE LUNGS DAILY ?What changed: See the new instructions. ?  ? ?  ? ? Follow-up Information   ? ? Delford Field, FNP. Schedule an appointment as soon as possible for a visit.   ?Specialty: Family Medicine ?Contact information: ?[address] ?[place] [address] ?[phone] ? ? ?  ?  ? ? Lily Peer A, FNP Follow up in 1 week(s).   ?Specialty: Family Medicine ?Contact information: ?[address] ?[place] [address] ?[phone] ? ? ?  ?  ? ?  ?  ? ?  ? ?Allergies  ?Allergen Reactions  ? Ace Inhibitors Anaphylaxis and Swelling  ?  Angioedema  ? Zestril [Lisinopril] Anaphylaxis and Swelling  ?  Angioedema  ? ? ?Consultations: Urology ? ? ?Procedures/Studies: ?CT Angio Chest PE W and/or Wo Contrast ? ?Result Date: 07/04/2021 ?CLINICAL DATA:  Pulmonary embolism (PE) suspected, high prob; status post total laryngectomy for laryngeal carcinoma now with progressive dyspnea. RLQ abdominal pain (Age >= 14y). EXAM: CT ANGIOGRAPHY CHEST CT ABDOMEN AND PELVIS WITH CONTRAST TECHNIQUE: Multidetector CT imaging of the chest was performed using the standard protocol during bolus administration of intravenous contrast. Multiplanar CT image reconstructions and MIPs were obtained to evaluate the vascular anatomy. Multidetector CT imaging of the abdomen and pelvis was performed using the  standard protocol during bolus administration of intravenous contrast. RADIATION DOSE REDUCTION: This exam was performed according to the departmental dose-optimization program which includes automated exposu

## 2021-07-06 NOTE — Progress Notes (Signed)
Patient discharged to home with instructions. 

## 2021-07-08 LAB — CULTURE, BLOOD (ROUTINE X 2)
Culture: NO GROWTH
Culture: NO GROWTH
Special Requests: ADEQUATE

## 2021-07-16 ENCOUNTER — Other Ambulatory Visit: Payer: Self-pay | Admitting: Urology

## 2021-07-20 ENCOUNTER — Other Ambulatory Visit (HOSPITAL_COMMUNITY): Payer: Self-pay

## 2021-08-01 NOTE — Progress Notes (Addendum)
COVID Vaccine Completed: Yes  Date of COVID positive in last 90 days:  No  PCP - Lily Peer, FNP Cardiologist - N/A Pulmonologist - Baltazar Apo, MD  Chest x-ray - 07-03-21 Epic EKG - 07-03-21 Epic Stress Test - N/A ECHO - 07-10-20 Epic Cardiac Cath - N/A Pacemaker/ICD device last checked: Spinal Cord Stimulator:  Bowel Prep - N/A  Sleep Study - Yes, neg sleep apnea CPAP - N/A  Fasting Blood Sugar - 120 to 250 Checks Blood Sugar - 1 time a day  Blood Thinner Instructions: Eliquis.  Last took one week ago. 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  Stated that Afib is managed by PCP  Does not use O2, was discussed at last hospitalization but patient has not heard anything else concerning this.  Suboxone - last dose today, will not take in the morning  Patient denies shortness of breath, fever, cough and chest pain at PAT appointment (completed over the phone)  Patient verbalized understanding of instructions that were given to them at the PAT appointment. Patient was also instructed that they will need to review over the PAT instructions again at home before surgery.

## 2021-08-01 NOTE — Patient Instructions (Addendum)
DUE TO COVID-19 ONLY TWO VISITORS  (aged 64 and older)  IS ALLOWED TO COME WITH YOU AND STAY IN THE WAITING ROOM ONLY DURING PRE OP AND PROCEDURE.   **NO VISITORS ARE ALLOWED IN THE SHORT STAY AREA OR RECOVERY ROOM!!**   You are not required to quarantine Hand Hygiene often Do NOT share personal items Notify your provider if you are in close contact with someone who has COVID or you develop fever 100.4 or greater, new onset of sneezing, cough, sore throat, shortness of breath or body aches.        Your procedure is scheduled on:  08-10-21   Report to [facility] Main Entrance    Report to admitting at 10:15 AM   Call this number if you have problems the morning of surgery [phone]   Do not eat food :After Midnight.   After Midnight you may have the following liquids until 9:30 AM DAY OF SURGERY  Clear Liquid Diet Water Black Coffee (sugar ok, NO MILK/CREAM OR CREAMERS)  Tea (sugar ok, NO MILK/CREAM OR CREAMERS) regular and decaf                             Plain Jell-O (NO RED)                                           Fruit ices (not with fruit pulp, NO RED)                                     Popsicles (NO RED)                                                                  Juice: apple, WHITE grape, WHITE cranberry Sports drinks like Gatorade (NO RED) Clear broth(vegetable,chicken,beef)                      If you have questions, please contact your surgeon's office.   FOLLOW ANY ADDITIONAL PRE OP INSTRUCTIONS YOU RECEIVED FROM YOUR SURGEON'S OFFICE!!!     Oral Hygiene is also important to reduce your risk of infection.                                    Remember - BRUSH YOUR TEETH THE MORNING OF SURGERY WITH YOUR REGULAR TOOTHPASTE   Do NOT smoke after Midnight  DO NOT [place]. PHARMACY WILL DISPENSE MEDICATIONS LISTED ON YOUR MEDICATION LIST TO YOU DURING YOUR ADMISSION [place]!   Take these medicines the  morning of surgery with A SIP OF WATER:   Atorvastatin, Diltiazem.  Okay to use inhalers (bring with you day of surgery)  How to Manage Your Diabetes Before and After Surgery  Why is it important to control my blood sugar before and after surgery? Improving blood sugar levels before and after surgery helps healing and can limit problems. A way of  improving blood sugar control is eating a healthy diet by:  Eating less sugar and carbohydrates  Increasing activity/exercise  Talking with your doctor about reaching your blood sugar goals High blood sugars (greater than 180 mg/dL) can raise your risk of infections and slow your recovery, so you will need to focus on controlling your diabetes during the weeks before surgery. Make sure that the doctor who takes care of your diabetes knows about your planned surgery including the date and location.  How do I manage my blood sugar before surgery? Check your blood sugar at least 4 times a day, starting 2 days before surgery, to make sure that the level is not too high or low. Check your blood sugar the morning of your surgery when you wake up and every 2 hours until you get to the Short Stay unit. If your blood sugar is less than 70 mg/dL, you will need to treat for low blood sugar: Do not take insulin. Treat a low blood sugar (less than 70 mg/dL) with  cup of clear juice (cranberry or apple), 4 glucose tablets, OR glucose gel. Recheck blood sugar in 15 minutes after treatment (to make sure it is greater than 70 mg/dL). If your blood sugar is not greater than 70 mg/dL on recheck, call [phone] for further instructions. Report your blood sugar to the short stay nurse when you get to Short Stay.  If you are admitted to the hospital after surgery: Your blood sugar will be checked by the staff and you will probably be given insulin after surgery (instead of oral diabetes medicines) to make sure you have good blood sugar levels. The goal for blood  sugar control after surgery is 80-180 mg/dL.   WHAT DO I DO ABOUT MY DIABETES MEDICATION?  Do not take oral diabetes medicines (pills) the morning of surgery.  THE DAY BEFORE SURGERY: Novolog insulin take as prescribed (no bedtime dose).                  Take Lantus insulin as prescribed      THE MORNING OF SURGERY:  Take 1/2 of Novolog insulin if CBG is 220 or higher                Take 50% of Lanus insulin   Reviewed and Endorsed by [facility] Patient Education Committee, August 2015                               You may not have any metal on your body including hair pins, jewelry, and body piercing             Do not wear make-up, lotions, powders, perfumes or deodorant  Do not wear nail polish including gel and S&S, artificial/acrylic nails, or any other type of covering on natural nails including finger and toenails. If you have artificial nails, gel coating, etc. that needs to be removed by a nail salon please have this removed prior to surgery or surgery may need to be canceled/ delayed if the surgeon/ anesthesia feels like they are unable to be safely monitored.   Do not shave  48 hours prior to surgery.    Contacts, dentures or bridgework may not be worn into surgery.   Do not bring valuables to the hospital. [place].   Patients discharged on the day of surgery will not be allowed to drive home.  Someone NEEDS to stay with you for the first 24 hours after anesthesia.  Special Instructions: Bring a copy of your healthcare power of attorney and living will documents the day of surgery if you haven't scanned them before.  Please read over the following fact sheets you were given: IF YOU HAVE QUESTIONS ABOUT YOUR PRE-OP INSTRUCTIONS PLEASE CALL [facility] - Preparing for Surgery Before surgery, you can play an important role.  Because skin is not sterile, your skin needs to be as free of germs as possible.  You can  reduce the number of germs on your skin by washing with CHG (chlorahexidine gluconate) soap before surgery.  CHG is an antiseptic cleaner which kills germs and bonds with the skin to continue killing germs even after washing. Please DO NOT use if you have an allergy to CHG or antibacterial soaps.  If your skin becomes reddened/irritated stop using the CHG and inform your nurse when you arrive at Short Stay. Do not shave (including legs and underarms) for at least 48 hours prior to the first CHG shower.  You may shave your face/neck.  Please follow these instructions carefully:  1.  Shower with CHG Soap the night before surgery and the  morning of surgery.  2.  If you choose to wash your hair, wash your hair first as usual with your normal  shampoo.  3.  After you shampoo, rinse your hair and body thoroughly to remove the shampoo.                             4.  Use CHG as you would any other liquid soap.  You can apply chg directly to the skin and wash.  Gently with a scrungie or clean washcloth.  5.  Apply the CHG Soap to your body ONLY FROM THE NECK DOWN.   Do   not use on face/ open                           Wound or open sores. Avoid contact with eyes, ears mouth and   genitals (private parts).                       Wash face,  Genitals (private parts) with your normal soap.             6.  Wash thoroughly, paying special attention to the area where your    surgery  will be performed.  7.  Thoroughly rinse your body with warm water from the neck down.  8.  DO NOT shower/wash with your normal soap after using and rinsing off the CHG Soap.                9.  Pat yourself dry with a clean towel.            10.  Wear clean pajamas.            11.  Place clean sheets on your bed the night of your first shower and do not  sleep with pets. Day of Surgery : Do not apply any lotions/deodorants the morning of surgery.  Please wear clean clothes to the hospital/surgery center.  FAILURE TO FOLLOW THESE  INSTRUCTIONS MAY RESULT IN THE CANCELLATION OF YOUR SURGERY  PATIENT SIGNATURE_________________________________  NURSE SIGNATURE__________________________________  ________________________________________________________________________

## 2021-08-09 ENCOUNTER — Encounter (HOSPITAL_COMMUNITY): Admission: RE | Admit: 2021-08-09 | Payer: Medicaid Other | Source: Ambulatory Visit

## 2021-08-09 ENCOUNTER — Other Ambulatory Visit: Payer: Self-pay

## 2021-08-09 ENCOUNTER — Encounter (HOSPITAL_COMMUNITY): Payer: Self-pay

## 2021-08-09 ENCOUNTER — Encounter (HOSPITAL_COMMUNITY)
Admission: RE | Admit: 2021-08-09 | Discharge: 2021-08-09 | Disposition: A | Discharge status: Home / Self Care | Payer: Medicaid Other | Source: Ambulatory Visit | Attending: Urology | Admitting: Urology

## 2021-08-09 VITALS — Ht 66.0 in | Wt 230.0 lb

## 2021-08-09 DIAGNOSIS — I2510 Atherosclerotic heart disease of native coronary artery without angina pectoris: Secondary | ICD-10-CM

## 2021-08-09 DIAGNOSIS — E119 Type 2 diabetes mellitus without complications: Secondary | ICD-10-CM

## 2021-08-10 ENCOUNTER — Encounter (HOSPITAL_COMMUNITY)
Admission: RE | Disposition: A | Discharge status: Home / Self Care | Payer: Self-pay | Source: Home / Self Care | Attending: Urology

## 2021-08-10 ENCOUNTER — Ambulatory Visit (HOSPITAL_COMMUNITY): Payer: Medicaid Other

## 2021-08-10 ENCOUNTER — Ambulatory Visit (HOSPITAL_COMMUNITY)
Admission: RE | Admit: 2021-08-10 | Discharge: 2021-08-10 | Disposition: A | Discharge status: Home / Self Care | Payer: Medicaid Other | Attending: Urology | Admitting: Urology

## 2021-08-10 ENCOUNTER — Encounter (HOSPITAL_COMMUNITY): Payer: Self-pay | Admitting: Urology

## 2021-08-10 ENCOUNTER — Ambulatory Visit (HOSPITAL_BASED_OUTPATIENT_CLINIC_OR_DEPARTMENT_OTHER): Payer: Medicaid Other | Admitting: Certified Registered"

## 2021-08-10 ENCOUNTER — Ambulatory Visit (HOSPITAL_COMMUNITY): Payer: Medicaid Other | Admitting: Physician Assistant

## 2021-08-10 DIAGNOSIS — I10 Essential (primary) hypertension: Secondary | ICD-10-CM

## 2021-08-10 DIAGNOSIS — N201 Calculus of ureter: Secondary | ICD-10-CM | POA: Insufficient documentation

## 2021-08-10 DIAGNOSIS — F418 Other specified anxiety disorders: Secondary | ICD-10-CM | POA: Diagnosis not present

## 2021-08-10 DIAGNOSIS — E119 Type 2 diabetes mellitus without complications: Secondary | ICD-10-CM | POA: Diagnosis not present

## 2021-08-10 DIAGNOSIS — J449 Chronic obstructive pulmonary disease, unspecified: Secondary | ICD-10-CM

## 2021-08-10 DIAGNOSIS — Z87891 Personal history of nicotine dependence: Secondary | ICD-10-CM | POA: Diagnosis not present

## 2021-08-10 DIAGNOSIS — Z794 Long term (current) use of insulin: Secondary | ICD-10-CM | POA: Insufficient documentation

## 2021-08-10 DIAGNOSIS — I739 Peripheral vascular disease, unspecified: Secondary | ICD-10-CM | POA: Insufficient documentation

## 2021-08-10 DIAGNOSIS — G4730 Sleep apnea, unspecified: Secondary | ICD-10-CM | POA: Diagnosis not present

## 2021-08-10 DIAGNOSIS — I2510 Atherosclerotic heart disease of native coronary artery without angina pectoris: Secondary | ICD-10-CM

## 2021-08-10 HISTORY — PX: CYSTOSCOPY/URETEROSCOPY/HOLMIUM LASER: SHX6545

## 2021-08-10 LAB — BASIC METABOLIC PANEL
Anion gap: 7 (ref 5–15)
BUN: 27 mg/dL — ABNORMAL HIGH (ref 8–23)
CO2: 24 mmol/L (ref 22–32)
Calcium: 8.9 mg/dL (ref 8.9–10.3)
Chloride: 107 mmol/L (ref 98–111)
Creatinine, Ser: 1.15 mg/dL — ABNORMAL HIGH (ref 0.44–1.00)
GFR, Estimated: 54 mL/min — ABNORMAL LOW (ref >60)
Glucose, Bld: 155 mg/dL — ABNORMAL HIGH (ref 70–99)
Potassium: 3.9 mmol/L (ref 3.5–5.1)
Sodium: 138 mmol/L (ref 135–145)

## 2021-08-10 LAB — GLUCOSE, CAPILLARY
Glucose-Capillary: 155 mg/dL — ABNORMAL HIGH (ref 70–99)
Glucose-Capillary: 149 mg/dL — ABNORMAL HIGH (ref 70–99)

## 2021-08-10 SURGERY — CYSTOURETEROSCOPY, USING HOLMIUM LASER
Anesthesia: General | Site: Renal | Laterality: Right

## 2021-08-10 MED ORDER — LIDOCAINE HCL (PF) 2 % IJ SOLN
INTRAMUSCULAR | Status: AC
  Filled 2021-08-10: qty 5

## 2021-08-10 MED ORDER — IOHEXOL 300 MG/ML  SOLN
INTRAMUSCULAR | Status: DC | PRN
  Administered 2021-08-10: 10 mL via URETHRAL

## 2021-08-10 MED ORDER — SULFAMETHOXAZOLE-TRIMETHOPRIM 800-160 MG PO TABS: 1.0000 | ORAL_TABLET | Freq: Two times a day (BID) | ORAL | 0 refills | Status: DC

## 2021-08-10 MED ORDER — ONDANSETRON HCL 4 MG/2ML IJ SOLN
INTRAMUSCULAR | Status: AC
  Filled 2021-08-10: qty 2

## 2021-08-10 MED ORDER — SODIUM CHLORIDE 0.9 % IV SOLN
1.0000 g | Freq: Once | INTRAVENOUS | Status: AC
  Administered 2021-08-10: 1 g via INTRAVENOUS
  Filled 2021-08-10: qty 1

## 2021-08-10 MED ORDER — MIDAZOLAM HCL 2 MG/2ML IJ SOLN
INTRAMUSCULAR | Status: AC
  Filled 2021-08-10: qty 2

## 2021-08-10 MED ORDER — LIDOCAINE HCL (CARDIAC) PF 100 MG/5ML IV SOSY
PREFILLED_SYRINGE | INTRAVENOUS | Status: DC | PRN
  Administered 2021-08-10: 100 mg via INTRAVENOUS

## 2021-08-10 MED ORDER — PROPOFOL 10 MG/ML IV BOLUS
INTRAVENOUS | Status: AC
  Filled 2021-08-10: qty 20

## 2021-08-10 MED ORDER — DEXAMETHASONE SODIUM PHOSPHATE 10 MG/ML IJ SOLN
INTRAMUSCULAR | Status: DC | PRN
  Administered 2021-08-10: 10 mg via INTRAVENOUS

## 2021-08-10 MED ORDER — ROCURONIUM BROMIDE 100 MG/10ML IV SOLN
INTRAVENOUS | Status: DC | PRN
  Administered 2021-08-10: 20 mg via INTRAVENOUS

## 2021-08-10 MED ORDER — ONDANSETRON HCL 4 MG/2ML IJ SOLN
INTRAMUSCULAR | Status: DC | PRN
  Administered 2021-08-10: 4 mg via INTRAVENOUS

## 2021-08-10 MED ORDER — IPRATROPIUM-ALBUTEROL 0.5-2.5 (3) MG/3ML IN SOLN
RESPIRATORY_TRACT | Status: AC
  Filled 2021-08-10: qty 3

## 2021-08-10 MED ORDER — DEXAMETHASONE SODIUM PHOSPHATE 10 MG/ML IJ SOLN
INTRAMUSCULAR | Status: AC
  Filled 2021-08-10: qty 1

## 2021-08-10 MED ORDER — 0.9 % SODIUM CHLORIDE (POUR BTL) OPTIME
TOPICAL | Status: DC | PRN
  Administered 2021-08-10: 1000 mL

## 2021-08-10 MED ORDER — CEFAZOLIN SODIUM-DEXTROSE 2-4 GM/100ML-% IV SOLN
2.0000 g | INTRAVENOUS | Status: DC
  Filled 2021-08-10: qty 100

## 2021-08-10 MED ORDER — ORAL CARE MOUTH RINSE: 15.0000 mL | Freq: Once | OROMUCOSAL | Status: AC

## 2021-08-10 MED ORDER — CHLORHEXIDINE GLUCONATE 0.12 % MT SOLN
15.0000 mL | Freq: Once | OROMUCOSAL | Status: AC
  Administered 2021-08-10: 15 mL via OROMUCOSAL

## 2021-08-10 MED ORDER — SUGAMMADEX SODIUM 200 MG/2ML IV SOLN
INTRAVENOUS | Status: DC | PRN
  Administered 2021-08-10: 200 mg via INTRAVENOUS

## 2021-08-10 MED ORDER — PROPOFOL 10 MG/ML IV BOLUS
INTRAVENOUS | Status: DC | PRN
  Administered 2021-08-10: 200 mg via INTRAVENOUS

## 2021-08-10 MED ORDER — LACTATED RINGERS IV SOLN: INTRAVENOUS | Status: DC

## 2021-08-10 MED ORDER — FENTANYL CITRATE (PF) 100 MCG/2ML IJ SOLN
INTRAMUSCULAR | Status: AC
  Filled 2021-08-10: qty 2

## 2021-08-10 MED ORDER — IPRATROPIUM-ALBUTEROL 0.5-2.5 (3) MG/3ML IN SOLN
3.0000 mL | Freq: Once | RESPIRATORY_TRACT | Status: AC
Start: 2021-08-10 — End: 2021-08-10
  Administered 2021-08-10: 3 mL via RESPIRATORY_TRACT
  Filled 2021-08-10: qty 3

## 2021-08-10 MED ORDER — SODIUM CHLORIDE 0.9 % IR SOLN
Status: DC | PRN
  Administered 2021-08-10: 3000 mL

## 2021-08-10 SURGICAL SUPPLY — 24 items
BAG URO CATCHER STRL LF (MISCELLANEOUS) ×3 IMPLANT
BASKET LASER NITINOL 1.9FR (BASKET) IMPLANT
BASKET ZERO TIP NITINOL 2.4FR (BASKET) ×1 IMPLANT
BSKT STON RTRVL 120 1.9FR (BASKET)
BSKT STON RTRVL ZERO TP 2.4FR (BASKET) ×1
CATH URETL OPEN END 6FR 70 (CATHETERS) ×3 IMPLANT
CLOTH BEACON ORANGE TIMEOUT ST (SAFETY) ×3 IMPLANT
EXTRACTOR STONE 1.7FRX115CM (UROLOGICAL SUPPLIES) IMPLANT
GLOVE BIO SURGEON STRL SZ7.5 (GLOVE) ×3 IMPLANT
GOWN STRL REUS W/ TWL XL LVL3 (GOWN DISPOSABLE) ×2 IMPLANT
GOWN STRL REUS W/TWL XL LVL3 (GOWN DISPOSABLE) ×2
GUIDEWIRE ANG ZIPWIRE 038X150 (WIRE) IMPLANT
GUIDEWIRE STR DUAL SENSOR (WIRE) ×3 IMPLANT
KIT TURNOVER KIT A (KITS) ×1 IMPLANT
LASER FIB FLEXIVA PULSE ID 365 (Laser) IMPLANT
MANIFOLD NEPTUNE II (INSTRUMENTS) ×3 IMPLANT
PACK CYSTO (CUSTOM PROCEDURE TRAY) ×3 IMPLANT
SHEATH NAVIGATOR HD 11/13X28 (SHEATH) IMPLANT
SHEATH NAVIGATOR HD 11/13X36 (SHEATH) IMPLANT
STENT URET 6FRX26 CONTOUR (STENTS) ×1 IMPLANT
TRACTIP FLEXIVA PULS ID 200XHI (Laser) IMPLANT
TRACTIP FLEXIVA PULSE ID 200 (Laser)
TUBING CONNECTING 10 (TUBING) ×3 IMPLANT
TUBING UROLOGY SET (TUBING) ×3 IMPLANT

## 2021-08-10 NOTE — H&P (Signed)
H&P  Chief Complaint: Right ureteral calculus  History of Present Illness: 66 year old female multiple medical comorbidities status post urgent right ureteral stent placement for right obstructing ureteral stone and UTI/sepsis.  She presents for definitive management of her stone.  Past Medical History:  Diagnosis Date   Anxiety    Arthritis    COPD (chronic obstructive pulmonary disease) ([place])    Depression    Diabetes mellitus without complication ([place])    INSULIN DEPENDENT   Diabetic neuropathy (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;  Surgeon: Newt Minion, MD;  Location: [facility];  Service: Orthopedics;  Laterality: Left;   AMPUTATION Left 09/13/2015   Procedure: AMPUTATION BELOW KNEE;  Surgeon: Marybelle Killings, MD;  Location: [facility];  Service: Orthopedics;  Laterality: Left;   BRAIN SURGERY     vertebral aneurysm   BREAST LUMPECTOMY  1982   benign   CARPEL TUNNEL     CHOLECYSTECTOMY     CYSTOSCOPY W/ URETERAL STENT PLACEMENT Left 03/22/2014   Procedure: CYSTOSCOPY WITH RETROGRADE PYELOGRAM/URETERAL STENT PLACEMENT;  Surgeon: Alexis Frock, MD;  Location: [facility];  Service: Urology;  Laterality: Left;   CYSTOSCOPY WITH RETROGRADE PYELOGRAM, URETEROSCOPY AND STENT PLACEMENT Left 05/11/2014   Procedure: CYSTOSCOPY WITH RETROGRADE PYELOGRAM, URETEROSCOPY AND STENT PLACEMENT;  Surgeon: Alexis Frock, MD;  Location: [facility];  Service: Urology;  Laterality: Left;   CYSTOSCOPY WITH STENT PLACEMENT Right 07/04/2021   Procedure: Cystoscopy  with right retrograde pyelogram and right ureteral stent placement;  Surgeon: Lucas Mallow, MD;  Location: [facility];  Service: Urology;  Laterality: Right;   HOLMIUM LASER APPLICATION Left 1/76/1607   Procedure: HOLMIUM LASER APPLICATION;  Surgeon: Alexis Frock, MD;  Location: [facility];  Service: Urology;  Laterality: Left;   LARYNGECTOMY     total ( stoma)   LEG AMPUTATION BELOW KNEE Left 09/13/2015   SALIVARY STONE REMOVAL     TONSILLECTOMY     TUBAL LIGATION      Home Medications:  Medications Prior to Admission  Medication Sig Dispense Refill Last Dose   albuterol (VENTOLIN HFA) 108 (90 Base) MCG/ACT inhaler INHALE 2 PUFFS INTO THE LUNGS EVERY 4 (FOUR) HOURS AS NEEDED FOR WHEEZING OR SHORTNESS OF BREATH. (Patient taking differently: Inhale 2 puffs into the lungs every 4 (four) hours as needed for wheezing or shortness of breath.) 18 each 5 08/10/2021 at 0800   apixaban (ELIQUIS) 5 MG TABS tablet Take 1 tablet (5 mg total) by mouth 2 (two) times daily. 60 tablet 0 08/03/2021   atorvastatin (LIPITOR) 20 MG tablet Take 20 mg by mouth daily.   08/09/2021   Buprenorphine HCl-Naloxone HCl 8-2 MG FILM Place 1 Film under the tongue 3 (three) times daily.    08/09/2021   cyclobenzaprine (FLEXERIL) 10 MG tablet Take 1 tablet by mouth every 12 (twelve) hours.   08/09/2021  diltiazem (CARDIZEM CD) 240 MG 24 hr capsule Take 1 capsule (240 mg total) by mouth daily. 30 capsule 0 08/09/2021   ibuprofen (ADVIL) 800 MG tablet Take 800 mg by mouth every 8 (eight) hours as needed for mild pain.   Past Week   insulin aspart (NOVOLOG) 100 UNIT/ML FlexPen Inject 10 Units into the skin 3 (three) times daily with meals. 15 mL 0 08/09/2021   insulin glargine (LANTUS SOLOSTAR) 100 UNIT/ML Solostar Pen Inject 45 Units into the skin daily. At 10AM 15 mL 0 08/09/2021   pregabalin (LYRICA) 75 MG capsule Take 75 mg by mouth every 12 (twelve) hours.   08/09/2021   sennosides-docusate sodium (SENOKOT-S) 8.6-50 MG tablet Take 1  tablet by mouth daily as needed for constipation.      STIOLTO RESPIMAT 2.5-2.5 MCG/ACT AERS INHALE 2 PUFFS BY MOUTH INTO THE LUNGS DAILY (Patient taking differently: Inhale 2 puffs into the lungs daily.) 4 g 1 08/10/2021 at 0800   Insulin Pen Needle (PEN NEEDLES 3/16") 31G X 5 MM MISC Use as directed. 100 each 3    Allergies:  Allergies  Allergen Reactions   Ace Inhibitors Anaphylaxis and Swelling    Angioedema   Zestril [Lisinopril] Anaphylaxis and Swelling    Angioedema    Family History  Adopted: Yes   Social History:  reports that she quit smoking about 6 years ago. Her smoking use included cigarettes. She has a 40.00 pack-year smoking history. She has never used smokeless tobacco. She reports that she does not drink alcohol and does not use drugs.  ROS: A complete review of systems was performed.  All systems are negative except for pertinent findings as noted. ROS   Physical Exam:  Vital signs in last 24 hours: Temp:  [98.3 F (36.8 C)] 98.3 F (36.8 C) (06/16 1020) Pulse Rate:  [90] 90 (06/16 1020) Resp:  [18] 18 (06/16 1020) BP: (172)/(88) 172/88 (06/16 1020) SpO2:  [92 %] 92 % (06/16 1020) General:  Alert and oriented, No acute distress HEENT: Normocephalic, atraumatic Neck: No JVD or lymphadenopathy Cardiovascular: Regular rate and rhythm Lungs: Regular rate and effort Abdomen: Soft, nontender, nondistended, no abdominal masses Back: No CVA tenderness Extremities: No edema Neurologic: Grossly intact  Laboratory Data:  Results for orders placed or performed during the hospital encounter of 08/10/21 (from the past 24 hour(s))  Glucose, capillary     Status: Abnormal   Collection Time: 08/10/21 10:24 AM  Result Value Ref Range   Glucose-Capillary 155 (H) 70 - 99 mg/dL   Comment 1 Notify RN    Comment 2 Document in Chart   Basic metabolic panel per protocol     Status: Abnormal   Collection Time: 08/10/21 10:53 AM  Result Value Ref Range   Sodium 138 135 -  145 mmol/L   Potassium 3.9 3.5 - 5.1 mmol/L   Chloride 107 98 - 111 mmol/L   CO2 24 22 - 32 mmol/L   Glucose, Bld 155 (H) 70 - 99 mg/dL   BUN 27 (H) 8 - 23 mg/dL   Creatinine, Ser 1.15 (H) 0.44 - 1.00 mg/dL   Calcium 8.9 8.9 - 10.3 mg/dL   GFR, Estimated 54 (L) >60 mL/min   Anion gap 7 5 - 15   No results found for this or any previous visit (from the past 240 hour(s)). Creatinine: Recent Labs    08/10/21 1053  CREATININE 1.15*    Impression/Assessment:  Right renal/ureteral calculus  Plan:  Proceed with cystoscopy with  right ureteroscopy with laser lithotripsy and ureteral stent exchange.  Marton Redwood, III 08/10/2021, 12:44 PM

## 2021-08-10 NOTE — Anesthesia Preprocedure Evaluation (Addendum)
Anesthesia Evaluation  Patient identified by MRN, date of birth, ID band Patient awake    Reviewed: Allergy & Precautions, NPO status , Patient's Chart, lab work & pertinent test results  Airway Mallampati: Trach   Neck ROM: Limited    Dental  (+) Poor Dentition, Dental Advisory Given   Pulmonary sleep apnea , COPD, former smoker,     + decreased breath sounds      Cardiovascular hypertension, + Peripheral Vascular Disease   Rhythm:Regular Rate:Normal     Neuro/Psych PSYCHIATRIC DISORDERS Anxiety Depression  Neuromuscular disease    GI/Hepatic negative GI ROS, Neg liver ROS,   Endo/Other  diabetes  Renal/GU Renal disease     Musculoskeletal  (+) Arthritis , Fibromyalgia -  Abdominal Normal abdominal exam  (+)   Peds  Hematology negative hematology ROS (+)   Anesthesia Other Findings   Reproductive/Obstetrics                           Anesthesia Physical Anesthesia Plan  ASA: 4  Anesthesia Plan: General   Post-op Pain Management:    Induction: Intravenous  PONV Risk Score and Plan: 4 or greater and Ondansetron, Promethazine and Treatment may vary due to age or medical condition  Airway Management Planned: Tracheostomy  Additional Equipment: None  Intra-op Plan:   Post-operative Plan: Extubation in OR  Informed Consent: I have reviewed the patients History and Physical, chart, labs and discussed the procedure including the risks, benefits and alternatives for the proposed anesthesia with the patient or authorized representative who has indicated his/her understanding and acceptance.       Plan Discussed with: CRNA  Anesthesia Plan Comments:       Anesthesia Quick Evaluation

## 2021-08-10 NOTE — Anesthesia Procedure Notes (Signed)
Procedure Name: Intubation Date/Time: 08/10/2021 1:02 PM  Performed by: Giovannie Scerbo, Forest Gleason, CRNAPre-anesthesia Checklist: Patient identified, Emergency Drugs available, Suction available and Patient being monitored Patient Re-evaluated:Patient Re-evaluated prior to induction Oxygen Delivery Method: Circle system utilized Preoxygenation: Pre-oxygenation with 100% oxygen Induction Type: IV induction and Tracheostomy Ventilation: Unable to mask ventilate Tube size: 6.5 mm Number of attempts: 1 Post-procedure assessment: ett placed through stoma, pre induction ventilation via stoma blow by. Tube secured with: Tape Dental Injury: Teeth and Oropharynx as per pre-operative assessment  Difficulty Due To: Difficulty was unanticipated

## 2021-08-10 NOTE — Op Note (Signed)
Operative Note  Preoperative diagnosis:  1.  Right ureteral calculus  Postoperative diagnosis: 1.  Right ureteral calculus  Procedure(s): 1.  Cystoscopy with right retrograde pyelogram, right ureteroscopy with stone extraction, right ureteral stent exchange  Surgeon: Link Snuffer, MD  Assistants: None  Anesthesia: General  Complications: None immediate  EBL: Minimal  Specimens: 1.  Renal calculus  Drains/Catheters: 1.  6 x 24 double-J ureteral stent on a string  Intraoperative findings: 1.  Normal urethra and bladder 2.  Approximately 1 cm ureteral calculus that was elongated as well that was able to be basket extracted with ease.  No other ureteral calculi.  Retrograde pyelogram revealed no filling defect or stone.  Indication: 66 year old female with multiple medical comorbidities status post urgent right ureteral stent placement presents for definitive management of her stone  Description of procedure:  The patient was identified and consent was obtained.  The patient was taken to the operating room and placed in the supine position.  The patient was placed under general anesthesia.  Perioperative antibiotics were administered.  The patient was placed in dorsal lithotomy.  Patient was prepped and draped in a standard sterile fashion and a timeout was performed.  A 21 French rigid cystoscope was advanced into the urethra and into the bladder.  Complete cystoscopy was performed with no abnormal findings except for some chronic irritation from the stent.  The stent was grasped and pulled just beyond the urethral meatus.  A wire was advanced through this stent and up to the kidney under fluoroscopic guidance.  Stent was withdrawn.  Wire was secured to the drape as a safety wire.  I advanced a semirigid ureteroscope alongside the wire up to the stone.  It was elongated and the ureter was wide open.  Therefore, I used a basket to grasp the stone and was able to easily extract it.  I  readvanced the scope up the ureter and no other ureteral calculi were seen.  I shot a retrograde pyelogram through the scope with no abnormal findings.  I withdrew the scope visualizing the ureter upon removal and again there were no ureteral calculi and no ureteral injury was seen.  I then placed a 6 x 24 double-J ureteral stent over the wire under fluoroscopic guidance and withdrew the wire.  Fluoroscopy confirmed proximal and distal placement.  Also confirmed distal placement with the scope.  I drained the bladder.  I placed the string inside the vagina.  This include the operation.  Patient tolerated the procedure well and stable postoperative.  Plan: Patient will remove her stent on Wednesday morning.

## 2021-08-10 NOTE — Transfer of Care (Signed)
Immediate Anesthesia Transfer of Care Note  Patient: [name]  Procedure(s) Performed: CYSTOSCOPY RIGHT AND STENT PLACEMENT (Right: Renal)  Patient Location: PACU  Anesthesia Type:General  Level of Consciousness: awake  Airway & Oxygen Therapy: Patient Spontanous Breathing  Post-op Assessment: Report given to RN  Post vital signs: stable  Last Vitals:  Vitals Value Taken Time  BP 160/86 08/10/21 1349  Temp    Pulse 86 08/10/21 1357  Resp    SpO2 98 % 08/10/21 1357  Vitals shown include unvalidated device data.  Last Pain:  Vitals:   08/10/21 1044  TempSrc:   PainSc: 6          Complications:  Encounter Notable Events  Notable Event Outcome Phase Comment  Difficult to intubate - unexpected  Intraprocedure Filed from anesthesia note documentation.

## 2021-08-10 NOTE — Discharge Instructions (Addendum)
[facility] [id number] Post Ureteroscopy With or Without Stent Instructions  He had a stent on a string.  Remove the stent by gently pulling the string next Wednesday morning.  Definitions:  Ureter: The duct that transports urine from the kidney to the bladder. Stent:   A plastic hollow tube that is placed into the ureter, from the kidney to the                 bladder to prevent the ureter from swelling shut.  GENERAL INSTRUCTIONS:  Despite the fact that no skin incisions were used, the area around the ureter and bladder is raw and irritated. The stent is a foreign body which will further irritate the bladder wall. This irritation is manifested by increased frequency of urination, both day and night, and by an increase in the urge to urinate. In some, the urge to urinate is present almost always. Sometimes the urge is strong enough that you may not be able to stop yourself from urinating. The only real cure is to remove the stent and then give time for the bladder wall to heal which can't be done until the danger of the ureter swelling shut has passed, which varies.  You may see some blood in your urine while the stent is in place and a few days afterwards. Do not be alarmed, even if the urine was clear for a while. Get off your feet and drink lots of fluids until clearing occurs. If you start to pass clots or don't improve, call us.  DIET: You may return to your normal diet immediately. Because of the raw surface of your bladder, alcohol, spicy foods, acid type foods and drinks with caffeine may cause irritation or frequency and should be used in moderation. To keep your urine flowing freely and to avoid constipation, drink plenty of fluids during the day ( 8-10 glasses ). Tip: Avoid cranberry juice because it is very acidic.  ACTIVITY: Your physical activity doesn't need to be restricted. However, if you are very active, you may see some blood in your urine. We suggest  that you reduce your activity under these circumstances until the bleeding has stopped.  BOWELS: It is important to keep your bowels regular during the postoperative period. Straining with bowel movements can cause bleeding. A bowel movement every other day is reasonable. Use a mild laxative if needed, such as Milk of Magnesia 2-3 tablespoons, or 2 Dulcolax tablets. Call if you continue to have problems. If you have been taking narcotics for pain, before, during or after your surgery, you may be constipated. Take a laxative if necessary.   MEDICATION: You should resume your pre-surgery medications unless told not to. You may take oxybutynin or flomax if prescribed for bladder spasms or discomfort from the stent Take pain medication as directed for pain refractory to conservative management  PROBLEMS YOU SHOULD REPORT TO [place]: Fevers over 100.5 Fahrenheit. Heavy bleeding, or clots ( See above notes about blood in urine ). Inability to urinate. Drug reactions ( hives, rash, nausea, vomiting, diarrhea ). Severe burning or pain with urination that is not improving.

## 2021-08-10 NOTE — Anesthesia Postprocedure Evaluation (Signed)
Anesthesia Post Note  Patient: [name]  Procedure(s) Performed: CYSTOSCOPY RIGHT AND STENT PLACEMENT (Right: Renal)     Patient location during evaluation: PACU Anesthesia Type: General Level of consciousness: awake and alert Pain management: pain level controlled Vital Signs Assessment: post-procedure vital signs reviewed and stable Respiratory status: spontaneous breathing, nonlabored ventilation, respiratory function stable and patient connected to nasal cannula oxygen Cardiovascular status: blood pressure returned to baseline and stable Postop Assessment: no apparent nausea or vomiting Anesthetic complications: yes Comments: Breathing tx administered in PACU.   Encounter Notable Events  Notable Event Outcome Phase Comment  Difficult to intubate - unexpected  Intraprocedure Filed from anesthesia note documentation.    Last Vitals:  Vitals:   08/10/21 1445 08/10/21 1500  BP: (!) 156/77   Pulse: 92 83  Resp:    Temp:    SpO2: (!) 89% 91%    Last Pain:  Vitals:   08/10/21 1500  TempSrc:   PainSc: 0-No pain                 Effie Berkshire

## 2021-08-11 ENCOUNTER — Encounter (HOSPITAL_COMMUNITY): Payer: Self-pay | Admitting: Urology

## 2021-09-13 ENCOUNTER — Other Ambulatory Visit (HOSPITAL_COMMUNITY): Payer: Self-pay

## 2022-01-07 ENCOUNTER — Other Ambulatory Visit: Payer: Self-pay | Admitting: Emergency Medicine

## 2022-01-07 DIAGNOSIS — J432 Centrilobular emphysema: Secondary | ICD-10-CM

## 2022-02-14 ENCOUNTER — Other Ambulatory Visit: Payer: Self-pay | Admitting: Family

## 2022-02-14 DIAGNOSIS — Z1231 Encounter for screening mammogram for malignant neoplasm of breast: Secondary | ICD-10-CM

## 2022-03-15 ENCOUNTER — Other Ambulatory Visit: Payer: Self-pay | Admitting: Emergency Medicine

## 2022-03-15 DIAGNOSIS — J432 Centrilobular emphysema: Secondary | ICD-10-CM

## 2022-09-07 ENCOUNTER — Emergency Department (HOSPITAL_COMMUNITY): Payer: Medicaid Other

## 2022-09-07 ENCOUNTER — Other Ambulatory Visit: Payer: Self-pay

## 2022-09-07 ENCOUNTER — Encounter (HOSPITAL_COMMUNITY): Payer: Self-pay | Admitting: Emergency Medicine

## 2022-09-07 ENCOUNTER — Inpatient Hospital Stay (HOSPITAL_COMMUNITY)
Admission: EM | Admit: 2022-09-07 | Discharge: 2022-09-10 | DRG: 189 | Disposition: A | Discharge status: Home / Self Care | Payer: Medicaid Other | Attending: Internal Medicine | Admitting: Internal Medicine

## 2022-09-07 DIAGNOSIS — E1140 Type 2 diabetes mellitus with diabetic neuropathy, unspecified: Secondary | ICD-10-CM | POA: Diagnosis present

## 2022-09-07 DIAGNOSIS — Z87891 Personal history of nicotine dependence: Secondary | ICD-10-CM

## 2022-09-07 DIAGNOSIS — Z9049 Acquired absence of other specified parts of digestive tract: Secondary | ICD-10-CM

## 2022-09-07 DIAGNOSIS — J189 Pneumonia, unspecified organism: Secondary | ICD-10-CM | POA: Diagnosis present

## 2022-09-07 DIAGNOSIS — Z6841 Body Mass Index (BMI) 40.0 and over, adult: Secondary | ICD-10-CM

## 2022-09-07 DIAGNOSIS — J9601 Acute respiratory failure with hypoxia: Principal | ICD-10-CM | POA: Diagnosis present

## 2022-09-07 DIAGNOSIS — Z79899 Other long term (current) drug therapy: Secondary | ICD-10-CM

## 2022-09-07 DIAGNOSIS — F32A Depression, unspecified: Secondary | ICD-10-CM | POA: Diagnosis present

## 2022-09-07 DIAGNOSIS — I509 Heart failure, unspecified: Secondary | ICD-10-CM

## 2022-09-07 DIAGNOSIS — Z5982 Transportation insecurity: Secondary | ICD-10-CM

## 2022-09-07 DIAGNOSIS — J441 Chronic obstructive pulmonary disease with (acute) exacerbation: Secondary | ICD-10-CM | POA: Diagnosis present

## 2022-09-07 DIAGNOSIS — Z794 Long term (current) use of insulin: Secondary | ICD-10-CM

## 2022-09-07 DIAGNOSIS — Z8589 Personal history of malignant neoplasm of other organs and systems: Secondary | ICD-10-CM

## 2022-09-07 DIAGNOSIS — E1165 Type 2 diabetes mellitus with hyperglycemia: Secondary | ICD-10-CM | POA: Diagnosis present

## 2022-09-07 DIAGNOSIS — G894 Chronic pain syndrome: Secondary | ICD-10-CM | POA: Diagnosis present

## 2022-09-07 DIAGNOSIS — I5032 Chronic diastolic (congestive) heart failure: Secondary | ICD-10-CM | POA: Diagnosis present

## 2022-09-07 DIAGNOSIS — Z87442 Personal history of urinary calculi: Secondary | ICD-10-CM

## 2022-09-07 DIAGNOSIS — Z89512 Acquired absence of left leg below knee: Secondary | ICD-10-CM

## 2022-09-07 DIAGNOSIS — M797 Fibromyalgia: Secondary | ICD-10-CM | POA: Diagnosis present

## 2022-09-07 DIAGNOSIS — F419 Anxiety disorder, unspecified: Secondary | ICD-10-CM | POA: Diagnosis present

## 2022-09-07 DIAGNOSIS — J432 Centrilobular emphysema: Secondary | ICD-10-CM

## 2022-09-07 DIAGNOSIS — Z1152 Encounter for screening for COVID-19: Secondary | ICD-10-CM

## 2022-09-07 DIAGNOSIS — I2489 Other forms of acute ischemic heart disease: Secondary | ICD-10-CM | POA: Diagnosis present

## 2022-09-07 DIAGNOSIS — Z7901 Long term (current) use of anticoagulants: Secondary | ICD-10-CM

## 2022-09-07 DIAGNOSIS — J440 Chronic obstructive pulmonary disease with acute lower respiratory infection: Secondary | ICD-10-CM | POA: Diagnosis present

## 2022-09-07 DIAGNOSIS — G4733 Obstructive sleep apnea (adult) (pediatric): Secondary | ICD-10-CM | POA: Diagnosis present

## 2022-09-07 DIAGNOSIS — Z888 Allergy status to other drugs, medicaments and biological substances status: Secondary | ICD-10-CM

## 2022-09-07 DIAGNOSIS — E875 Hyperkalemia: Secondary | ICD-10-CM | POA: Diagnosis present

## 2022-09-07 DIAGNOSIS — I480 Paroxysmal atrial fibrillation: Secondary | ICD-10-CM | POA: Diagnosis present

## 2022-09-07 DIAGNOSIS — Z91148 Patient's other noncompliance with medication regimen for other reason: Secondary | ICD-10-CM

## 2022-09-07 DIAGNOSIS — I110 Hypertensive heart disease with heart failure: Secondary | ICD-10-CM | POA: Diagnosis present

## 2022-09-07 LAB — CBC
HCT: 52.7 % — ABNORMAL HIGH (ref 36.0–46.0)
Hemoglobin: 17.2 g/dL — ABNORMAL HIGH (ref 12.0–15.0)
MCH: 28.2 pg (ref 26.0–34.0)
MCHC: 32.6 g/dL (ref 30.0–36.0)
MCV: 86.5 fL (ref 80.0–100.0)
Platelets: 224 10*3/uL (ref 150–400)
RBC: 6.09 MIL/uL — ABNORMAL HIGH (ref 3.87–5.11)
RDW: 12.6 % (ref 11.5–15.5)
WBC: 11.3 10*3/uL — ABNORMAL HIGH (ref 4.0–10.5)
nRBC: 0 % (ref 0.0–0.2)

## 2022-09-07 MED ORDER — IPRATROPIUM-ALBUTEROL 0.5-2.5 (3) MG/3ML IN SOLN
3.0000 mL | Freq: Once | RESPIRATORY_TRACT | Status: AC
  Administered 2022-09-07: 3 mL via RESPIRATORY_TRACT

## 2022-09-07 MED ORDER — METHYLPREDNISOLONE SODIUM SUCC 125 MG IJ SOLR
125.0000 mg | Freq: Once | INTRAMUSCULAR | Status: AC
  Administered 2022-09-07: 125 mg via INTRAVENOUS
  Filled 2022-09-07: qty 2

## 2022-09-07 NOTE — ED Provider Notes (Signed)
[place] [facility] EMERGENCY DEPARTMENT AT [facility] Provider Note   CSN: [id number] Arrival date & time: 09/07/22  2300     History {Add pertinent medical, surgical, social history, OB history to HPI:1} Chief Complaint  Patient presents with   Shortness of Breath    [name] is a 67 y.o. female.  The history is provided by the patient and a relative. The history is limited by the condition of the patient.  Patient with history of chronic pain, COPD, previous total laryngectomy presents with shortness of breath.  Patient reports around 3 PM she started having increasing shortness of breath and chest pain and tightness.  She does not recall having this chest pain previously.  She does report increasing cough with thick sputum with no large-volume hemoptysis. Patient reports she was able to suction from her stoma at home. She also reports recently out of her home medications due to transportation issues Patient brought in by EMS and given DuoNebs with some improvement. Patient reports her chest pain got severe she took her home Suboxone.  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 Authorizing Provider  apixaban (ELIQUIS) 5 MG TABS tablet Take 1 tablet (5 mg total) by mouth 2 (two) times daily.  07/06/21 08/05/21  Hughie Closs, MD  atorvastatin (LIPITOR) 20 MG tablet Take 20 mg by mouth daily. 04/13/20   [provider]  Buprenorphine HCl-Naloxone HCl 8-2 MG FILM Place 1 Film under the tongue 3 (three) times daily.     [provider]  cyclobenzaprine (FLEXERIL) 10 MG tablet Take 1 tablet by mouth every 12 (twelve) hours. 05/09/20   [provider]  diltiazem (CARDIZEM CD) 240 MG 24 hr capsule Take 1 capsule (240 mg total) by mouth daily. 07/13/20 08/10/21  Jerald Kief, MD  ibuprofen (ADVIL) 800 MG tablet Take 800 mg by mouth every 8 (eight) hours as needed for mild pain. 07/24/21   [provider]  insulin aspart (NOVOLOG) 100 UNIT/ML FlexPen Inject 10 Units into the skin 3 (three) times daily with meals. 07/12/20   Jerald Kief, MD  insulin glargine (LANTUS SOLOSTAR) 100 UNIT/ML Solostar Pen Inject 45 Units into the skin daily. At 10AM 07/12/20   Jerald Kief, MD  Insulin Pen Needle (PEN NEEDLES 3/16") 31G X 5 MM MISC Use as directed. 03/24/14   Osvaldo Shipper, MD  pregabalin (LYRICA) 75 MG capsule Take 75 mg by mouth every 12 (twelve) hours. 07/10/21   [provider]  sennosides-docusate sodium (SENOKOT-S) 8.6-50 MG tablet Take 1 tablet by mouth daily as needed for  constipation.    [provider]  STIOLTO RESPIMAT 2.5-2.5 MCG/ACT AERS INHALE 2 PUFFS BY MOUTH INTO THE LUNGS DAILY Patient taking differently: Inhale 2 puffs into the lungs daily. 05/11/20   Leslye Peer, MD  VENTOLIN HFA 108 (90 Base) MCG/ACT inhaler INHALE 2 PUFFS BY MOUTH EVERY 4 HOURS AS NEEDED FOR WHEEZE OR FOR SHORTNESS OF BREATH 03/19/22   Leslye Peer, MD      Allergies    Ace inhibitors and Zestril [lisinopril]    Review of Systems   Review of Systems  Constitutional:  Negative for fever.  Respiratory:  Positive for cough and shortness of breath.     Physical Exam Updated Vital Signs BP 138/78   Pulse 94   Temp (!) 96.5 F (35.8 C) (Axillary)    Resp 19   Ht 1.676 m (5\' 6" )   Wt 113.4 kg   SpO2 98%   BMI 40.35 kg/m  Physical Exam CONSTITUTIONAL: Ill-appearing, no acute distress HEAD: Normocephalic/atraumatic EYES: EOMI/PERRL ENMT: Mucous membranes moist NECK: supple no meningeal signs, laryngectomy noted CV: S1/S2 noted, tachycardiac LUNGS: Tachypnea and wheezing bilaterally ABDOMEN: soft, nontender NEURO: Pt is awake/alert/appropriate, moves all extremitiesx4.  No facial droop.   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 SKIN: warm, color normal PSYCH: no abnormalities of mood noted, alert and oriented to situation  ED Results / Procedures / Treatments   Labs (all labs ordered are listed, but only abnormal results are displayed) Labs Reviewed  RESP PANEL BY RT-PCR (RSV, FLU A&B, COVID)  RVPGX2  CBC  BASIC METABOLIC PANEL  BRAIN NATRIURETIC PEPTIDE  TROPONIN I (HIGH SENSITIVITY)    EKG EKG Interpretation Date/Time:  Saturday September 07 2022 23:03:39 EDT Ventricular Rate:  96 PR Interval:  179 QRS Duration:  104 QT Interval:  398 QTC Calculation: 503 R Axis:   270  Text Interpretation: Sinus rhythm Incomplete RBBB and LAFB Anterior infarct, old Minimal ST elevation, lateral leads Interpretation limited secondary to artifact Confirmed by Zadie Rhine ([id number]) on 09/07/2022 11:07:51 PM  Radiology No results found.  Procedures Procedures  {Document cardiac monitor, telemetry assessment procedure when appropriate:1}  Medications Ordered in ED Medications  methylPREDNISolone sodium succinate (SOLU-MEDROL) 125 mg/2 mL injection 125 mg (has no administration in time range)  ipratropium-albuterol (DUONEB) 0.5-2.5 (3) MG/3ML nebulizer solution 3 mL (has no administration in time range)    ED Course/ Medical Decision Making/ A&P   {   Click here for ABCD2, HEART and other calculatorsREFRESH Note before signing :1}                          Medical Decision Making Amount and/or  Complexity of Data Reviewed Labs: ordered. Radiology: ordered.  Risk Prescription drug management.   This patient presents to the ED for concern of shortness of breath, this involves an extensive number of treatment options, and is a complaint that carries with it a high risk of complications and morbidity.  The differential diagnosis includes but is not limited to Acute coronary syndrome, pneumonia, acute pulmonary edema, pneumothorax, acute anemia, pulmonary embolism    Comorbidities that complicate the patient evaluation: Patient's presentation is complicated by their history of COPD, laryngectomy  Social Determinants of Health: Patient's impaired access to primary care and transportation issues   increases the complexity of managing their presentation  Additional history obtained: Additional history obtained from family Records reviewed previous admission documents  Lab Tests: I Ordered, and  personally interpreted labs.  The pertinent results include:  ***  Imaging Studies ordered: I ordered imaging studies including X-ray chest   I independently visualized and interpreted imaging which showed *** I agree with the radiologist interpretation  Cardiac Monitoring: The patient was maintained on a cardiac monitor.  I personally viewed and interpreted the cardiac monitor which showed an underlying rhythm of:  sinus rhythm  Medicines ordered and prescription drug management: I ordered medication including DuoNeb and steroids for wheezing Reevaluation of the patient after these medicines showed that the patient    {resolved/improved/worsened:23923::"improved"}  Test Considered: Patient is low risk / negative by ***, therefore do not feel that *** is indicated.  Critical Interventions:  ***  Consultations Obtained: I requested consultation with the {consultation:26851}, and discussed  findings as well as pertinent plan - they recommend: ***  Reevaluation: After the  interventions noted above, I reevaluated the patient and found that they have :{resolved/improved/worsened:23923::"improved"}  Complexity of problems addressed: Patient's presentation is most consistent with  {ZOXW:96045}  Disposition: After consideration of the diagnostic results and the patient's response to treatment,  I feel that the patent would benefit from {disposition:26850}.     {Document critical care time when appropriate:1} {Document review of labs and clinical decision tools ie heart score, Chads2Vasc2 etc:1}  {Document your independent review of radiology images, and any outside records:1} {Document your discussion with family members, caretakers, and with consultants:1} {Document social determinants of health affecting pt's care:1} {Document your decision making why or why not admission, treatments were needed:1} Final Clinical Impression(s) / ED Diagnoses Final diagnoses:  None    Rx / DC Orders ED Discharge Orders     None

## 2022-09-07 NOTE — ED Triage Notes (Signed)
Pt BIB EMS from home for ShOB. Pt states she ran out of her meds last week and has had an increase of mucous production since then. Reports an increase in ShOB since 2pm today. 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 States she feels she cannot take a deep breath and c/o CP. Initially 87% on RA. 2 Duonebs given by EMS with improvement in breathing and CP.

## 2022-09-08 ENCOUNTER — Inpatient Hospital Stay (HOSPITAL_COMMUNITY): Payer: Medicaid Other

## 2022-09-08 DIAGNOSIS — E875 Hyperkalemia: Secondary | ICD-10-CM | POA: Diagnosis present

## 2022-09-08 DIAGNOSIS — G4733 Obstructive sleep apnea (adult) (pediatric): Secondary | ICD-10-CM | POA: Diagnosis present

## 2022-09-08 DIAGNOSIS — E1140 Type 2 diabetes mellitus with diabetic neuropathy, unspecified: Secondary | ICD-10-CM | POA: Diagnosis present

## 2022-09-08 DIAGNOSIS — I2489 Other forms of acute ischemic heart disease: Secondary | ICD-10-CM | POA: Diagnosis present

## 2022-09-08 DIAGNOSIS — I110 Hypertensive heart disease with heart failure: Secondary | ICD-10-CM | POA: Diagnosis present

## 2022-09-08 DIAGNOSIS — Z89512 Acquired absence of left leg below knee: Secondary | ICD-10-CM | POA: Diagnosis not present

## 2022-09-08 DIAGNOSIS — J9601 Acute respiratory failure with hypoxia: Secondary | ICD-10-CM | POA: Diagnosis present

## 2022-09-08 DIAGNOSIS — Z794 Long term (current) use of insulin: Secondary | ICD-10-CM | POA: Diagnosis not present

## 2022-09-08 DIAGNOSIS — Z87891 Personal history of nicotine dependence: Secondary | ICD-10-CM | POA: Diagnosis not present

## 2022-09-08 DIAGNOSIS — Z8589 Personal history of malignant neoplasm of other organs and systems: Secondary | ICD-10-CM | POA: Diagnosis not present

## 2022-09-08 DIAGNOSIS — M797 Fibromyalgia: Secondary | ICD-10-CM | POA: Diagnosis present

## 2022-09-08 DIAGNOSIS — G894 Chronic pain syndrome: Secondary | ICD-10-CM | POA: Diagnosis present

## 2022-09-08 DIAGNOSIS — I480 Paroxysmal atrial fibrillation: Secondary | ICD-10-CM | POA: Diagnosis present

## 2022-09-08 DIAGNOSIS — J189 Pneumonia, unspecified organism: Secondary | ICD-10-CM | POA: Diagnosis present

## 2022-09-08 DIAGNOSIS — R7989 Other specified abnormal findings of blood chemistry: Secondary | ICD-10-CM | POA: Diagnosis not present

## 2022-09-08 DIAGNOSIS — E1165 Type 2 diabetes mellitus with hyperglycemia: Secondary | ICD-10-CM | POA: Diagnosis present

## 2022-09-08 DIAGNOSIS — F32A Depression, unspecified: Secondary | ICD-10-CM | POA: Diagnosis present

## 2022-09-08 DIAGNOSIS — Z888 Allergy status to other drugs, medicaments and biological substances status: Secondary | ICD-10-CM | POA: Diagnosis not present

## 2022-09-08 DIAGNOSIS — I5032 Chronic diastolic (congestive) heart failure: Secondary | ICD-10-CM | POA: Diagnosis present

## 2022-09-08 DIAGNOSIS — Z1152 Encounter for screening for COVID-19: Secondary | ICD-10-CM | POA: Diagnosis not present

## 2022-09-08 DIAGNOSIS — J440 Chronic obstructive pulmonary disease with acute lower respiratory infection: Secondary | ICD-10-CM | POA: Diagnosis present

## 2022-09-08 DIAGNOSIS — F419 Anxiety disorder, unspecified: Secondary | ICD-10-CM | POA: Diagnosis present

## 2022-09-08 DIAGNOSIS — J441 Chronic obstructive pulmonary disease with (acute) exacerbation: Secondary | ICD-10-CM | POA: Diagnosis not present

## 2022-09-08 DIAGNOSIS — Z6841 Body Mass Index (BMI) 40.0 and over, adult: Secondary | ICD-10-CM | POA: Diagnosis not present

## 2022-09-08 LAB — RESP PANEL BY RT-PCR (RSV, FLU A&B, COVID)  RVPGX2
Influenza A by PCR: NEGATIVE
Influenza B by PCR: NEGATIVE
Resp Syncytial Virus by PCR: NEGATIVE
SARS Coronavirus 2 by RT PCR: NEGATIVE

## 2022-09-08 LAB — HEMOGLOBIN A1C
Hgb A1c MFr Bld: 7.7 % — ABNORMAL HIGH (ref 4.8–5.6)
Mean Plasma Glucose: 174.29 mg/dL

## 2022-09-08 LAB — BASIC METABOLIC PANEL
Anion gap: 16 — ABNORMAL HIGH (ref 5–15)
Anion gap: 16 — ABNORMAL HIGH (ref 5–15)
BUN: 20 mg/dL (ref 8–23)
BUN: 27 mg/dL — ABNORMAL HIGH (ref 8–23)
CO2: 26 mmol/L (ref 22–32)
CO2: 26 mmol/L (ref 22–32)
Calcium: 8.8 mg/dL — ABNORMAL LOW (ref 8.9–10.3)
Calcium: 9.1 mg/dL (ref 8.9–10.3)
Chloride: 95 mmol/L — ABNORMAL LOW (ref 98–111)
Chloride: 91 mmol/L — ABNORMAL LOW (ref 98–111)
Creatinine, Ser: 1.28 mg/dL — ABNORMAL HIGH (ref 0.44–1.00)
Creatinine, Ser: 1.5 mg/dL — ABNORMAL HIGH (ref 0.44–1.00)
GFR, Estimated: 47 mL/min — ABNORMAL LOW (ref >60)
GFR, Estimated: 39 mL/min — ABNORMAL LOW (ref >60)
Glucose, Bld: 246 mg/dL — ABNORMAL HIGH (ref 70–99)
Glucose, Bld: 400 mg/dL — ABNORMAL HIGH (ref 70–99)
Potassium: 5.5 mmol/L — ABNORMAL HIGH (ref 3.5–5.1)
Potassium: 3.8 mmol/L (ref 3.5–5.1)
Sodium: 137 mmol/L (ref 135–145)
Sodium: 133 mmol/L — ABNORMAL LOW (ref 135–145)

## 2022-09-08 LAB — D-DIMER, QUANTITATIVE: D-Dimer, Quant: 0.43 ug/mL-FEU (ref 0.00–0.50)

## 2022-09-08 LAB — GLUCOSE, CAPILLARY
Glucose-Capillary: 335 mg/dL — ABNORMAL HIGH (ref 70–99)
Glucose-Capillary: 345 mg/dL — ABNORMAL HIGH (ref 70–99)
Glucose-Capillary: 348 mg/dL — ABNORMAL HIGH (ref 70–99)
Glucose-Capillary: 269 mg/dL — ABNORMAL HIGH (ref 70–99)

## 2022-09-08 LAB — TROPONIN I (HIGH SENSITIVITY)
Troponin I (High Sensitivity): 531 ng/L (ref <18)
Troponin I (High Sensitivity): 462 ng/L (ref <18)

## 2022-09-08 LAB — HIV ANTIBODY (ROUTINE TESTING W REFLEX): HIV Screen 4th Generation wRfx: NONREACTIVE

## 2022-09-08 LAB — MRSA NEXT GEN BY PCR, NASAL: MRSA by PCR Next Gen: NOT DETECTED

## 2022-09-08 LAB — BRAIN NATRIURETIC PEPTIDE: B Natriuretic Peptide: 274.9 pg/mL — ABNORMAL HIGH (ref 0.0–100.0)

## 2022-09-08 MED ORDER — IPRATROPIUM-ALBUTEROL 0.5-2.5 (3) MG/3ML IN SOLN
3.0000 mL | Freq: Four times a day (QID) | RESPIRATORY_TRACT | Status: DC
  Administered 2022-09-08 – 2022-09-09 (×5): 3 mL via RESPIRATORY_TRACT
  Filled 2022-09-08 (×5): qty 3

## 2022-09-08 MED ORDER — FUROSEMIDE 10 MG/ML IJ SOLN
60.0000 mg | Freq: Once | INTRAMUSCULAR | Status: AC
  Administered 2022-09-08: 60 mg via INTRAVENOUS
  Filled 2022-09-08: qty 6

## 2022-09-08 MED ORDER — POLYETHYLENE GLYCOL 3350 17 G PO PACK: 17.0000 g | PACK | Freq: Every day | ORAL | Status: DC | PRN

## 2022-09-08 MED ORDER — SODIUM CHLORIDE 0.9 % IN NEBU: 3.0000 mL | INHALATION_SOLUTION | Freq: Three times a day (TID) | RESPIRATORY_TRACT | Status: DC | PRN

## 2022-09-08 MED ORDER — SODIUM ZIRCONIUM CYCLOSILICATE 5 G PO PACK
5.0000 g | PACK | Freq: Once | ORAL | Status: AC
  Administered 2022-09-08: 5 g via ORAL
  Filled 2022-09-08: qty 1

## 2022-09-08 MED ORDER — ENOXAPARIN SODIUM 60 MG/0.6ML IJ SOSY: 60.0000 mg | PREFILLED_SYRINGE | Freq: Every day | INTRAMUSCULAR | Status: DC

## 2022-09-08 MED ORDER — APIXABAN 5 MG PO TABS
5.0000 mg | ORAL_TABLET | Freq: Two times a day (BID) | ORAL | Status: DC
  Administered 2022-09-08 – 2022-09-10 (×4): 5 mg via ORAL
  Filled 2022-09-08 (×4): qty 1

## 2022-09-08 MED ORDER — PROCHLORPERAZINE EDISYLATE 10 MG/2ML IJ SOLN: 5.0000 mg | Freq: Four times a day (QID) | INTRAMUSCULAR | Status: DC | PRN

## 2022-09-08 MED ORDER — GUAIFENESIN 100 MG/5ML PO LIQD
5.0000 mL | ORAL | Status: DC | PRN
  Administered 2022-09-08: 5 mL via ORAL
  Filled 2022-09-08: qty 10

## 2022-09-08 MED ORDER — INSULIN GLARGINE-YFGN 100 UNIT/ML ~~LOC~~ SOLN
25.0000 [IU] | Freq: Every day | SUBCUTANEOUS | Status: DC
  Administered 2022-09-08 – 2022-09-09 (×2): 25 [IU] via SUBCUTANEOUS
  Filled 2022-09-08 (×2): qty 0.25

## 2022-09-08 MED ORDER — SODIUM CHLORIDE 0.9 % IV SOLN
100.0000 mg | Freq: Two times a day (BID) | INTRAVENOUS | Status: DC
  Administered 2022-09-08 – 2022-09-10 (×5): 100 mg via INTRAVENOUS
  Filled 2022-09-08 (×7): qty 100

## 2022-09-08 MED ORDER — PREGABALIN 75 MG PO CAPS
75.0000 mg | ORAL_CAPSULE | Freq: Two times a day (BID) | ORAL | Status: DC
  Filled 2022-09-08 (×2): qty 1

## 2022-09-08 MED ORDER — UMECLIDINIUM BROMIDE 62.5 MCG/ACT IN AEPB
1.0000 | INHALATION_SPRAY | Freq: Every day | RESPIRATORY_TRACT | Status: DC
  Filled 2022-09-08: qty 7

## 2022-09-08 MED ORDER — ACETAMINOPHEN 325 MG PO TABS
650.0000 mg | ORAL_TABLET | Freq: Four times a day (QID) | ORAL | Status: DC | PRN
  Administered 2022-09-08 – 2022-09-09 (×2): 650 mg via ORAL
  Filled 2022-09-08 (×2): qty 2

## 2022-09-08 MED ORDER — ASPIRIN 81 MG PO CHEW
324.0000 mg | CHEWABLE_TABLET | Freq: Once | ORAL | Status: AC
  Administered 2022-09-08: 324 mg via ORAL
  Filled 2022-09-08: qty 4

## 2022-09-08 MED ORDER — MELATONIN 5 MG PO TABS: 5.0000 mg | ORAL_TABLET | Freq: Every evening | ORAL | Status: DC | PRN

## 2022-09-08 MED ORDER — ALBUTEROL SULFATE (2.5 MG/3ML) 0.083% IN NEBU: 2.5000 mg | INHALATION_SOLUTION | RESPIRATORY_TRACT | Status: DC | PRN

## 2022-09-08 MED ORDER — SODIUM CHLORIDE 0.9 % IV SOLN
2.0000 g | Freq: Every day | INTRAVENOUS | Status: DC
  Administered 2022-09-08 – 2022-09-09 (×3): 2 g via INTRAVENOUS
  Filled 2022-09-08 (×3): qty 20

## 2022-09-08 MED ORDER — INSULIN ASPART 100 UNIT/ML IJ SOLN
0.0000 [IU] | Freq: Three times a day (TID) | INTRAMUSCULAR | Status: DC
  Administered 2022-09-08 (×2): 15 [IU] via SUBCUTANEOUS
  Administered 2022-09-09: 7 [IU] via SUBCUTANEOUS
  Administered 2022-09-09: 11 [IU] via SUBCUTANEOUS
  Administered 2022-09-09 – 2022-09-10 (×3): 7 [IU] via SUBCUTANEOUS

## 2022-09-08 MED ORDER — INSULIN ASPART 100 UNIT/ML IJ SOLN
0.0000 [IU] | Freq: Three times a day (TID) | INTRAMUSCULAR | Status: DC
  Administered 2022-09-08: 11 [IU] via SUBCUTANEOUS

## 2022-09-08 MED ORDER — ARFORMOTEROL TARTRATE 15 MCG/2ML IN NEBU
15.0000 ug | INHALATION_SOLUTION | Freq: Two times a day (BID) | RESPIRATORY_TRACT | Status: DC
  Administered 2022-09-08 – 2022-09-10 (×4): 15 ug via RESPIRATORY_TRACT
  Filled 2022-09-08 (×4): qty 2

## 2022-09-08 MED ORDER — INSULIN ASPART 100 UNIT/ML IJ SOLN
0.0000 [IU] | Freq: Every day | INTRAMUSCULAR | Status: DC
  Administered 2022-09-08: 3 [IU] via SUBCUTANEOUS
  Administered 2022-09-09: 2 [IU] via SUBCUTANEOUS

## 2022-09-08 MED ORDER — METHYLPREDNISOLONE SODIUM SUCC 40 MG IJ SOLR
40.0000 mg | Freq: Every day | INTRAMUSCULAR | Status: DC
  Administered 2022-09-08 – 2022-09-10 (×3): 40 mg via INTRAVENOUS
  Filled 2022-09-08 (×3): qty 1

## 2022-09-08 MED ORDER — BUPRENORPHINE HCL-NALOXONE HCL 8-2 MG SL SUBL
1.0000 | SUBLINGUAL_TABLET | Freq: Every day | SUBLINGUAL | Status: DC
  Administered 2022-09-09: 1 via SUBLINGUAL
  Filled 2022-09-08: qty 1

## 2022-09-08 MED ORDER — FUROSEMIDE 10 MG/ML IJ SOLN
40.0000 mg | Freq: Once | INTRAMUSCULAR | Status: AC
  Administered 2022-09-08: 40 mg via INTRAVENOUS
  Filled 2022-09-08: qty 4

## 2022-09-08 NOTE — Progress Notes (Signed)
Transported pt from ED24 to 2C16 with bedside RN. Vitals are stable.

## 2022-09-08 NOTE — Progress Notes (Signed)
   09/08/22 0058  Spiritual Encounters  Type of Visit Initial  Care provided to: Pt and family  Referral source IDT Rounds  Spiritual Framework  Presenting Themes Other (comment)  Community/Connection Family  Patient Stress Factors None identified  Family Stress Factors None identified  Interventions  Spiritual Care Interventions Made Established relationship of care and support;Encouragement  Intervention Outcomes  Outcomes Connection to spiritual care   Visited with patient and son in ED24, patient and family open to spiritual care. Talked with family, reflective listening and compassionate presence offered. Reduced fear. Patient being admitted to room 2C16. Patient open to chaplain future visits will in patient.

## 2022-09-08 NOTE — ED Notes (Signed)
ED TO INPATIENT HANDOFF REPORT  ED Nurse Name and Phone #: Morrie Sheldon [phone]   S Name/Age/Gender [name] 67 y.o. female Room/Bed: 024C/024C  Code Status   Code Status: Full Code  Home/SNF/Other Home Patient oriented to: self, place, time, and situation Is this baseline? Yes   Triage Complete: Triage complete  Chief Complaint Acute hypoxic respiratory failure (HCC) [J96.01]  Triage Note Pt BIB EMS from home for ShOB. Pt states she ran out of her meds last week and has had an increase of mucous production since then. Reports an increase in ShOB since 2pm today. 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 States she feels she cannot take a deep breath and c/o CP. Initially 87% on RA. 2 Duonebs given by EMS with improvement in breathing and CP.    Allergies Allergies  Allergen Reactions   Ace Inhibitors Anaphylaxis and Swelling    Angioedema   Zestril [Lisinopril] Anaphylaxis and Swelling    Angioedema    Level of Care/Admitting Diagnosis ED Disposition     ED Disposition  Admit   Condition  --   Comment  Hospital Area: [facility] [[id number]]  Level of Care: Progressive [102]  Admit to Progressive based on following criteria: RESPIRATORY PROBLEMS hypoxemic/hypercapnic respiratory failure that is responsive to NIPPV (BiPAP) or High Flow Nasal Cannula (6-80 lpm). Frequent assessment/intervention, no > Q2 hrs < Q4 hrs, to maintain oxygenation and pulmonary hygiene.  May admit patient to [facility] or [facility] if equivalent level of care is available:: No  Covid Evaluation: Asymptomatic - no recent exposure (last 10 days) testing not required  Diagnosis: Acute hypoxic respiratory failure [facility]) [[id number]]  Admitting Physician: Darlin Drop [[id number]]  Attending Physician: Darlin Drop [[id number]]  Certification:: I certify this patient will need inpatient services for at least 2 midnights   Estimated Length of Stay: 2          B Medical/Surgery History Past Medical History:  Diagnosis Date   Anxiety    Arthritis    COPD (chronic obstructive pulmonary disease) (HCC)    Depression    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   Diabetic neuropathy (HCC)    Difficult intubation    total laryngectomy   Fibromyalgia    Head and neck cancer (HCC) 04/19/2015   Hypertension    Kidney stone    Morbid obesity with BMI of 40.0-44.9, adult (HCC)    Neuropathy    Nocturia    Pneumonia    Rash, skin    face   Renal insufficiency    Shortness of breath    Sleep apnea    has not used cpap past 10 yrs   Urge incontinence    Past Surgical History:  Procedure Laterality Date   AMPUTATION Left 08/25/2015   Procedure: 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;  Surgeon: Nadara Mustard, MD;  Location: [facility] OR;  Service: Orthopedics;  Laterality: Left;   AMPUTATION Left 09/13/2015   Procedure: AMPUTATION BELOW KNEE;  Surgeon: Eldred Manges, MD;  Location: [facility] OR;  Service: Orthopedics;  Laterality: Left;   BRAIN SURGERY     vertebral aneurysm   BREAST LUMPECTOMY  1982   benign   CARPEL TUNNEL     CHOLECYSTECTOMY     CYSTOSCOPY W/ URETERAL STENT PLACEMENT Left 03/22/2014   Procedure: CYSTOSCOPY WITH RETROGRADE PYELOGRAM/URETERAL STENT PLACEMENT;  Surgeon: Sebastian Ache,  MD;  Location: [facility] OR;  Service: Urology;  Laterality: Left;   CYSTOSCOPY WITH RETROGRADE PYELOGRAM, URETEROSCOPY AND STENT PLACEMENT Left 05/11/2014   Procedure: CYSTOSCOPY WITH RETROGRADE PYELOGRAM, URETEROSCOPY AND STENT PLACEMENT;  Surgeon: Sebastian Ache, MD;  Location: [facility] ORS;  Service: Urology;  Laterality: Left;   CYSTOSCOPY WITH STENT PLACEMENT Right 07/04/2021   Procedure: Cystoscopy with right retrograde pyelogram and right ureteral stent placement;  Surgeon: Crista Elliot, MD;  Location: [facility] OR;  Service: Urology;  Laterality: Right;   CYSTOSCOPY/URETEROSCOPY/HOLMIUM LASER Right 08/10/2021    Procedure: CYSTOSCOPY RIGHT AND STENT PLACEMENT;  Surgeon: Crista Elliot, MD;  Location: [facility] ORS;  Service: Urology;  Laterality: Right;   HOLMIUM LASER APPLICATION Left 05/11/2014   Procedure: HOLMIUM LASER APPLICATION;  Surgeon: Sebastian Ache, MD;  Location: [facility] OR[facility];  Service: Urology;  Laterality: Left;   LARYNGECTOMY     total ( stoma)   LEG AMPUTATION BELOW KNEE Left 09/13/2015   SALIVARY STONE REMOVAL     TONSILLECTOMY     TUBAL LIGATION       A IV Location/Drains/Wounds Patient Lines/Drains/Airways Status     Active Line/Drains/Airways     Name Placement date Placement time Site Days   Peripheral IV 09/08/22 20 G Anterior;Distal;Right Forearm 09/08/22  0246  Forearm  less than 1   Ureteral Drain/Stent Right ureter 6 Fr. 07/04/21  0635  Right ureter  431   Ureteral Drain/Stent Right ureter 6 Fr. 08/10/21  1332  Right ureter  394   External Urinary Catheter 09/08/22  0247  --  less than 1   Incision (Closed) 07/04/21 Perineum Other (Comment) 07/04/21  0731  -- 431   Tracheostomy --  --  --  --   Tracheostomy Other (Comment) Laryngectomy --  --  --  --   Pressure Injury 07/08/20 Foot Anterior;Right Stage 2 -  Partial thickness loss of dermis presenting as a shallow open injury with a red, pink wound bed without slough. 07/08/20  0000  -- 792            Intake/Output Last 24 hours No intake or output data in the 24 hours ending 09/08/22 5366  Labs/Imaging Results for orders placed or performed during the hospital encounter of 09/07/22 (from the past 48 hour(s))  Resp panel by RT-PCR (RSV, Flu A&B, Covid) Anterior Nasal Swab     Status: None   Collection Time: 09/07/22  1:25 AM   Specimen: Anterior Nasal Swab  Result Value Ref Range   SARS Coronavirus 2 by RT PCR NEGATIVE NEGATIVE   Influenza A by PCR NEGATIVE NEGATIVE   Influenza B by PCR NEGATIVE NEGATIVE    Comment: (NOTE) The Xpert Xpress SARS-CoV-2/FLU/RSV plus assay is intended as an aid in the diagnosis of  influenza from Nasopharyngeal swab specimens and should not be used as a sole basis for treatment. Nasal washings and aspirates are unacceptable for Xpert Xpress SARS-CoV-2/FLU/RSV testing.  Fact Sheet for Patients: BloggerCourse.com  Fact Sheet for Healthcare Providers: SeriousBroker.it  This test is not yet approved or cleared by the [place] FDA and has been authorized for detection and/or diagnosis of SARS-CoV-2 by FDA under an Emergency Use Authorization (EUA). This EUA will remain in effect (meaning this test can be used) for the duration of the COVID-19 declaration under Section 564(b)(1) of the Act, 21 U.S.C. section 360bbb-3(b)(1), unless the authorization is terminated or revoked.     Resp Syncytial Virus by PCR NEGATIVE NEGATIVE    Comment: (  NOTE) Fact Sheet for Patients: BloggerCourse.com  Fact Sheet for Healthcare Providers: SeriousBroker.it  This test is not yet approved or cleared by the [place] FDA and has been authorized for detection and/or diagnosis of SARS-CoV-2 by FDA under an Emergency Use Authorization (EUA). This EUA will remain in effect (meaning this test can be used) for the duration of the COVID-19 declaration under Section 564(b)(1) of the Act, 21 U.S.C. section 360bbb-3(b)(1), unless the authorization is terminated or revoked.  Performed at [facility], [address], [place], [place] [address]   CBC     Status: Abnormal   Collection Time: 09/07/22 10:58 PM  Result Value Ref Range   WBC 11.3 (H) 4.0 - 10.5 K/uL   RBC 6.09 (H) 3.87 - 5.11 MIL/uL   Hemoglobin 17.2 (H) 12.0 - 15.0 g/dL   HCT 47.4 (H) 25.9 - 56.3 %   MCV 86.5 80.0 - 100.0 fL   MCH 28.2 26.0 - 34.0 pg   MCHC 32.6 30.0 - 36.0 g/dL   RDW 87.5 64.3 - 32.9 %   Platelets 224 150 - 400 K/uL   nRBC 0.0 0.0 - 0.2 %    Comment: Performed at [facility],  [address], [place], [place] [address]  Basic metabolic panel     Status: Abnormal   Collection Time: 09/07/22 10:58 PM  Result Value Ref Range   Sodium 137 135 - 145 mmol/L   Potassium 5.5 (H) 3.5 - 5.1 mmol/L    Comment: HEMOLYSIS AT THIS LEVEL MAY AFFECT RESULT   Chloride 95 (L) 98 - 111 mmol/L   CO2 26 22 - 32 mmol/L   Glucose, Bld 246 (H) 70 - 99 mg/dL    Comment: Glucose reference range applies only to samples taken after fasting for at least 8 hours.   BUN 20 8 - 23 mg/dL   Creatinine, Ser 1.66 (H) 0.44 - 1.00 mg/dL   Calcium 8.8 (L) 8.9 - 10.3 mg/dL   GFR, Estimated 47 (L) >60 mL/min    Comment: (NOTE) Calculated using the CKD-EPI Creatinine Equation (2021)    Anion gap 16 (H) 5 - 15    Comment: Performed at [facility], [address], [place], [place] [address]  Brain natriuretic peptide     Status: Abnormal   Collection Time: 09/07/22 10:58 PM  Result Value Ref Range   B Natriuretic Peptide 274.9 (H) 0.0 - 100.0 pg/mL    Comment: Performed at [facility], [address], [place], [place] [address]  Troponin I (High Sensitivity)     Status: Abnormal   Collection Time: 09/07/22 10:58 PM  Result Value Ref Range   Troponin I (High Sensitivity) 531 (HH) <18 ng/L    Comment: CRITICAL RESULT CALLED TO, READ BACK BY AND VERIFIED WITH Irene Pap, RN. [phone] 09/08/22. LPAIT (NOTE) Elevated high sensitivity troponin I (hsTnI) values and significant  changes across serial measurements may suggest ACS but many other  chronic and acute conditions are known to elevate hsTnI results.  Refer to the "Links" section for chest pain algorithms and additional  guidance. Performed at [facility], [address], [place], [place] [address]   Troponin I (High Sensitivity)     Status: Abnormal   Collection Time: 09/08/22  1:21 AM  Result Value Ref Range   Troponin I (High Sensitivity) 462 (HH) <18 ng/L    Comment: CRITICAL VALUE NOTED. VALUE IS CONSISTENT WITH PREVIOUSLY  REPORTED/CALLED VALUE (  NOTE) Elevated high sensitivity troponin I (hsTnI) values and significant  changes across serial measurements may suggest ACS but many other  chronic and acute conditions are known to elevate hsTnI results.  Refer to the "Links" section for chest pain algorithms and additional  guidance. Performed at [facility], [address], [place], [place] [address]   D-dimer, quantitative     Status: None   Collection Time: 09/08/22  1:21 AM  Result Value Ref Range   D-Dimer, Quant 0.43 0.00 - 0.50 ug/mL-FEU    Comment: (NOTE) At the manufacturer cut-off value of 0.5 g/mL FEU, this assay has a negative predictive value of 95-100%.This assay is intended for use in conjunction with a clinical pretest probability (PTP) assessment model to exclude pulmonary embolism (PE) and deep venous thrombosis (DVT) in outpatients suspected of PE or DVT. Results should be correlated with clinical presentation. Performed at [facility], [address], [place], [place] [address]    DG Chest Port 1 View  Result Date: 09/07/2022 CLINICAL DATA:  Shortness of breath EXAM: PORTABLE CHEST 1 VIEW COMPARISON:  07/03/2021 FINDINGS: Stable enlargement of the cardiomediastinal silhouette. Increased bilateral interstitial coarsening. Linear atelectasis or scarring in the left mid/upper lung. No pleural effusion or pneumothorax. No displaced rib fractures. IMPRESSION: Increased bilateral interstitial coarsening which may be due to edema or atypical infection. Electronically Signed   By: Minerva Fester M.D.   On: 09/07/2022 23:42    Pending Labs Unresulted Labs (From admission, onward)     Start     Ordered   09/08/22 0249  Hemoglobin A1c  Once,   R       Comments: To assess prior glycemic control    09/08/22 0248   09/08/22 0241  HIV Antibody (routine testing w rflx)  (HIV Antibody (Routine testing w reflex) panel)  Once,   R        09/08/22 0240             Vitals/Pain Today's Vitals   09/07/22 2304 09/07/22 2305 09/08/22 0045 09/08/22 0115  BP: 138/78  119/72 113/65  Pulse: 94  94 84  Resp: 19  (!) 22 14  Temp: (!) 96.5 F (35.8 C)     TempSrc: Axillary     SpO2: 98%  92% (!) 87%  Weight:  113.4 kg    Height:  5\' 6"  (1.676 m)    PainSc:  0-No pain      Isolation Precautions No active isolations  Medications Medications  ipratropium-albuterol (DUONEB) 0.5-2.5 (3) MG/3ML nebulizer solution 3 mL (has no administration in time range)  methylPREDNISolone sodium succinate (SOLU-MEDROL) 40 mg/mL injection 40 mg (has no administration in time range)  cefTRIAXone (ROCEPHIN) 2 g in sodium chloride 0.9 % 100 mL IVPB (has no administration in time range)  doxycycline (VIBRAMYCIN) 100 mg in sodium chloride 0.9 % 250 mL IVPB (has no administration in time range)  acetaminophen (TYLENOL) tablet 650 mg (has no administration in time range)  prochlorperazine (COMPAZINE) injection 5 mg (has no administration in time range)  polyethylene glycol (MIRALAX / GLYCOLAX) packet 17 g (has no administration in time range)  melatonin tablet 5 mg (has no administration in time range)  insulin aspart (novoLOG) injection 0-15 Units (has no administration in time range)  insulin aspart (novoLOG) injection 0-5 Units (has no administration in time range)  methylPREDNISolone sodium succinate (SOLU-MEDROL) 125 mg/2 mL injection 125 mg (125 mg Intravenous Given 09/07/22 2345)  ipratropium-albuterol (DUONEB) 0.5-2.5 (3)  MG/3ML nebulizer solution 3 mL (3 mLs Nebulization Given 09/07/22 2322)  aspirin chewable tablet 324 mg (324 mg Oral Given 09/08/22 0045)  furosemide (LASIX) injection 40 mg (40 mg Intravenous Given 09/08/22 0045)    Mobility manual wheelchair     Focused Assessments See chart   R Recommendations: See Admitting Provider Note  Report given to:   Additional Notes: see chart

## 2022-09-08 NOTE — H&P (Addendum)
History and Physical  [name] WUJ:[id number] DOB: October 27, 1955 DOA: 09/07/2022  Referring physician: Dr. Bebe Shaggy, EDP  PCP: Sherral Hammers, FNP  Outpatient Specialists: Pulmonary, ENT, oncology Patient coming from: Home.  Chief Complaint: Shortness of breath, wheezing, cough.  HPI: [name] is a 67 y.o. 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  Symptoms worsened today around 3 PM.  Associated with difficulty coughing and bringing sputum up.  Denies any subjective fevers or chills.  Reportedly the patient ran out of her home medications last week.  EMS was activated.  In the ED, she is noted to be hypoxic requiring 6 L to maintain her O2 saturation above 92%.  She is alert and oriented x 3.  Doing her own oral suctioning at bedside.  Her son is present in the room..  On exam, diffuse wheezing noted bilaterally, bilateral lower extremity edema.  Lab studies notable for elevated BNP greater than 200, high-sensitivity troponin initially 531 and downtrended 462 no evidence of acute ischemia on twelve-lead EKG.  EDP discussed the case with cardiology, suspects elevated troponin is demand ischemia from acute hypoxia.  Chest x-ray could not rule out atypical pneumonia.  [facility], hospitalist service, was asked to admit for further management of her present condition.  The patient received a dose of IV Lasix 40 mg x 1, IV Solu-Medrol 125 mg x 1, a full dose aspirin 324 mg x 1, and DuoNebs x 1.  Due to concern for possible, empiric Rocephin and IV doxycycline were added  ED Course: Temperature 96.5.  BP 138/78,  pulse 94, respiratory 19, O2 saturation 98% on 6 L via tracheostomy collar.  Lab studies notable for serum potassium 5.5, glucose 246, creatinine 1.28, anion gap 16, GFR 47.  BNP 274, troponin 530 1 repeat 462.  WBC 11.3, hemoglobin 17.2, platelet count 224.  Review of Systems: Review of systems as noted in the HPI. All other systems reviewed and are negative.   Past Medical History:  Diagnosis Date   Anxiety    Arthritis    COPD (chronic obstructive pulmonary disease) (HCC)    Depression    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   Diabetic neuropathy (HCC)    Difficult intubation    total laryngectomy   Fibromyalgia    Head and neck cancer (HCC) 04/19/2015   Hypertension    Kidney stone    Morbid obesity with BMI of 40.0-44.9, adult (HCC)    Neuropathy    Nocturia    Pneumonia    Rash, skin    face   Renal insufficiency    Shortness of breath    Sleep apnea    has not used cpap past 10 yrs   Urge incontinence    Past Surgical History:  Procedure Laterality Date   AMPUTATION Left 08/25/2015   Procedure: 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;  Surgeon: Nadara Mustard, MD;  Location: [facility] OR;  Service: Orthopedics;  Laterality: Left;   AMPUTATION Left 09/13/2015   Procedure: AMPUTATION BELOW KNEE;  Surgeon: Eldred Manges, MD;  Location: [facility] OR;  Service: Orthopedics;  Laterality: Left;   BRAIN SURGERY     vertebral aneurysm  BREAST LUMPECTOMY  1982   benign   CARPEL TUNNEL     CHOLECYSTECTOMY     CYSTOSCOPY W/ URETERAL STENT PLACEMENT Left 03/22/2014   Procedure: CYSTOSCOPY WITH RETROGRADE PYELOGRAM/URETERAL STENT PLACEMENT;  Surgeon: Sebastian Ache, MD;  Location: [facility] OR;  Service: Urology;  Laterality: Left;   CYSTOSCOPY WITH RETROGRADE PYELOGRAM, URETEROSCOPY AND STENT PLACEMENT Left 05/11/2014   Procedure: CYSTOSCOPY WITH RETROGRADE PYELOGRAM, URETEROSCOPY AND STENT PLACEMENT;  Surgeon: Sebastian Ache, MD;  Location: [facility] ORS;  Service: Urology;  Laterality:  Left;   CYSTOSCOPY WITH STENT PLACEMENT Right 07/04/2021   Procedure: Cystoscopy with right retrograde pyelogram and right ureteral stent placement;  Surgeon: Crista Elliot, MD;  Location: [facility] OR;  Service: Urology;  Laterality: Right;   CYSTOSCOPY/URETEROSCOPY/HOLMIUM LASER Right 08/10/2021   Procedure: CYSTOSCOPY RIGHT AND STENT PLACEMENT;  Surgeon: Crista Elliot, MD;  Location: [facility] ORS;  Service: Urology;  Laterality: Right;   HOLMIUM LASER APPLICATION Left 05/11/2014   Procedure: HOLMIUM LASER APPLICATION;  Surgeon: Sebastian Ache, MD;  Location: [facility] ORS;  Service: Urology;  Laterality: Left;   LARYNGECTOMY     total ( stoma)   LEG AMPUTATION BELOW KNEE Left 09/13/2015   SALIVARY STONE REMOVAL     TONSILLECTOMY     TUBAL LIGATION      Social History:  reports that she quit smoking about 7 years ago. Her smoking use included cigarettes. She started smoking about 47 years ago. She has a 40 pack-year smoking history. She has never used smokeless tobacco. She reports that she does not drink alcohol and does not use drugs.   Allergies  Allergen Reactions   Ace Inhibitors Anaphylaxis and Swelling    Angioedema   Zestril [Lisinopril] Anaphylaxis and Swelling    Angioedema    Family History  Adopted: Yes      Prior to Admission medications   Medication Sig Start Date End Date Taking? Authorizing Provider  apixaban (ELIQUIS) 5 MG TABS tablet Take 1 tablet (5 mg total) by mouth 2 (two) times daily. 07/06/21 08/05/21  Hughie Closs, MD  atorvastatin (LIPITOR) 20 MG tablet Take 20 mg by mouth daily. 04/13/20   [provider]  Buprenorphine HCl-Naloxone HCl 8-2 MG FILM Place 1 Film under the tongue 3 (three) times daily.     [provider]  cyclobenzaprine (FLEXERIL) 10 MG tablet Take 1 tablet by mouth every 12 (twelve) hours. 05/09/20   [provider]  diltiazem (CARDIZEM CD) 240 MG 24 hr capsule Take 1 capsule (240 mg total) by mouth daily. 07/13/20 08/10/21   Jerald Kief, MD  ibuprofen (ADVIL) 800 MG tablet Take 800 mg by mouth every 8 (eight) hours as needed for mild pain. 07/24/21   [provider]  insulin aspart (NOVOLOG) 100 UNIT/ML FlexPen Inject 10 Units into the skin 3 (three) times daily with meals. 07/12/20   Jerald Kief, MD  insulin glargine (LANTUS SOLOSTAR) 100 UNIT/ML Solostar Pen Inject 45 Units into the skin daily. At 10AM 07/12/20   Jerald Kief, MD  Insulin Pen Needle (PEN NEEDLES 3/16") 31G X 5 MM MISC Use as directed. 03/24/14   Osvaldo Shipper, MD  pregabalin (LYRICA) 75 MG capsule Take 75 mg by mouth every 12 (twelve) hours. 07/10/21   [provider]  sennosides-docusate sodium (SENOKOT-S) 8.6-50 MG tablet Take 1 tablet by mouth daily as needed for constipation.    [provider]  STIOLTO RESPIMAT 2.5-2.5 MCG/ACT AERS INHALE 2 PUFFS BY MOUTH INTO  THE LUNGS DAILY Patient taking differently: Inhale 2 puffs into the lungs daily. 05/11/20   Leslye Peer, MD  VENTOLIN HFA 108 (90 Base) MCG/ACT inhaler INHALE 2 PUFFS BY MOUTH EVERY 4 HOURS AS NEEDED FOR WHEEZE OR FOR SHORTNESS OF BREATH 03/19/22   Leslye Peer, MD    Physical Exam: BP 113/65   Pulse 84   Temp (!) 96.5 F (35.8 C) (Axillary)   Resp 14   Ht 5\' 6"  (1.676 m)   Wt 113.4 kg   SpO2 (!) 87%   BMI 40.35 kg/m   General: 67 y.o. year-old female well developed well nourished in no acute distress.  Alert and oriented x3. Cardiovascular: Regular rate and rhythm with no rubs or gallops.  No thyromegaly or JVD noted.  Trace lower extremity edema bilaterally, compression stockings in place. Respiratory: Diffuse wheezing and mild rales at bases. Good inspiratory effort. Abdomen: Soft nontender nondistended with normal bowel sounds x4 quadrants. Muskuloskeletal: No cyanosis or clubbing noted bilaterally.  Left below the knee amputation. Neuro: CN II-XII intact, strength, sensation, reflexes Skin: No ulcerative lesions noted or  rashes Psychiatry: Judgement and insight appear normal. Mood is appropriate for condition and setting          Labs on Admission:  Basic Metabolic Panel: Recent Labs  Lab 09/07/22 2258  NA 137  K 5.5*  CL 95*  CO2 26  GLUCOSE 246*  BUN 20  CREATININE 1.28*  CALCIUM 8.8*   Liver Function Tests: No results for input(s): "AST", "ALT", "ALKPHOS", "BILITOT", "PROT", "ALBUMIN" in the last 168 hours. No results for input(s): "LIPASE", "AMYLASE" in the last 168 hours. No results for input(s): "AMMONIA" in the last 168 hours. CBC: Recent Labs  Lab 09/07/22 2258  WBC 11.3*  HGB 17.2*  HCT 52.7*  MCV 86.5  PLT 224   Cardiac Enzymes: No results for input(s): "CKTOTAL", "CKMB", "CKMBINDEX", "TROPONINI" in the last 168 hours.  BNP (last 3 results) Recent Labs    09/07/22 2258  BNP 274.9*    ProBNP (last 3 results) No results for input(s): "PROBNP" in the last 8760 hours.  CBG: No results for input(s): "GLUCAP" in the last 168 hours.  Radiological Exams on Admission: DG Chest Port 1 View  Result Date: 09/07/2022 CLINICAL DATA:  Shortness of breath EXAM: PORTABLE CHEST 1 VIEW COMPARISON:  07/03/2021 FINDINGS: Stable enlargement of the cardiomediastinal silhouette. Increased bilateral interstitial coarsening. Linear atelectasis or scarring in the left mid/upper lung. No pleural effusion or pneumothorax. No displaced rib fractures. IMPRESSION: Increased bilateral interstitial coarsening which may be due to edema or atypical infection. Electronically Signed   By: Minerva Fester M.D.   On: 09/07/2022 23:42    EKG: I independently viewed the EKG done and my findings are as followed: Sinus rhythm rate of 99.  Nonspecific ST-T changes.  QTc 512.  Assessment/Plan Present on Admission:  Acute hypoxic respiratory failure (HCC)  Principal Problem:   Acute hypoxic respiratory failure (HCC)  Acute hypoxic respiratory failure secondary to acute COPD exacerbation versus mild  pulmonary edema from likely acute on chronic HFpEF Not on oxygen supplementation at baseline Currently requiring 6 L to maintain O2 saturation greater than 92% Continue IV Solu-Medrol 40 mg daily DuoNebs every 6 hours Rocephin and doxycycline due to concern for atypical pneumonia Strep pneumonia urine antigen, Legionella urine antigen Wean off oxygen supplementation as tolerated.  Elevated troponin, suspect demand ischemia in the setting of acute hypoxic respiratory failure Initial high-sensitivity troponin 531, repeat 462  No evidence of acute ischemia on twelve-lead EKG Monitor on telemetry Follow 2D echo  COPD exacerbation suspect secondary to, POA Management as stated above  Acute on chronic HFpEF Last 2D echo done in 2022 had normal LVEF Presents with BNP greater than 200 Bilateral lower extremity edema, left below the knee amputation Monitor strict I's and O's and daily weight IV Lasix 40 mg given in the ED Continue IV diuretics if blood pressure allows Obtain 2D echo  Morbid obesity BMI 40 Recommend weight loss outpatient with regular physical activity and healthy dieting.  Type 2 diabetes with hyperglycemia Last hemoglobin A1c 6.6 on 07/05/2021. Heart healthy carb modified diet Start insulin sliding scale  History of head and neck cancer Status post laryngectomy Monitor for now  OSA not on CPAP Will make it available in case she changes her mind.  QTc prolongation On admission QTc 512 on twelve-lead EKG Avoid QTc prolonging agents Optimize magnesium and potassium levels Monitor on telemetry.   Time: 75 minutes.   DVT prophylaxis: Subcu Lovenox daily  Code Status: Full code  Family Communication: Son at bedside  Disposition Plan: Admitted to progressive care unit  Consults called: Cardiology consulted by EDP  Admission status: Inpatient status.   Status is: Inpatient The patient requires at least 2 midnight for further evaluation and treatment  of present condition.   Darlin Drop MD [facility] Pager [phone]  If 7PM-7AM, please contact night-coverage [email or web address] Password [facility]  09/08/2022, 2:43 AM

## 2022-09-08 NOTE — Plan of Care (Signed)
  Problem: Coping: Goal: Ability to adjust to condition or change in health will improve Outcome: Progressing   Problem: Fluid Volume: Goal: Ability to maintain a balanced intake and output will improve Outcome: Progressing   Problem: Metabolic: Goal: Ability to maintain appropriate glucose levels will improve Outcome: Progressing   Problem: Skin Integrity: Goal: Risk for impaired skin integrity will decrease Outcome: Progressing   Problem: Tissue Perfusion: Goal: Adequacy of tissue perfusion will improve Outcome: Progressing   Problem: Clinical Measurements: Goal: Ability to maintain clinical measurements within normal limits will improve Outcome: Progressing

## 2022-09-08 NOTE — Progress Notes (Addendum)
  DAY ZERO NOTE    TOSHEBA KEYLON  WUJ:[id number] DOB: 17-Feb-1956 DOA: 09/07/2022 PCP: Sherral Hammers, FNP   Brief Narrative:   Day 0 note, for complete HPI please see H&P done earlier this morning, updated assessment and plan as below currently pending med reconciliation.   Amee F Cannedy is a 67 y.o. 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  Hospitalist called for admission  Assessment & Plan:   Principal Problem:   Acute hypoxic respiratory failure (HCC)  Acute hypoxic respiratory failure -likely multifactorial -See below for specific treatment in regards to COPD, heart failure, questionable pneumonia -Patient does not wear oxygen at baseline, currently requiring anywhere from 4 to 10 L via trach mask -Patient's hypoxia is labile in the setting of noncompliance -she routinely removes trach mask for coughing and does not reapply it. -At rest patient satting low 90s on 5 L trach mask, wean as tolerated  Acute COPD exacerbation  -Continue supportive care, oxygen as above, IV Solu-Medrol, DuoNebs -Home inhaler regimen pending pharmacy evaluation -Preliminary med rec list Stiolto, Ventolin, Bactrim -will await finalization as patient is unable to corroborate her last  Acute on chronic heart failure with preserved ejection fraction  -Bilateral pleural effusion versus edema noted on chest x-ray -Continue diuretics -lower extremity edema noted right foot greater than left BKA -Echocardiogram pending, last per chart is from May  2020  Questionable community-acquired pneumonia -Continue ceftriaxone, doxycycline given questionable atypical pneumonia -Oxygen weaning as above -Already on steroids in the setting of COPD  Elevated troponin, suspect demand ischemia in the setting of acute hypoxic respiratory failure -Troponin peak 531 downtrending appropriately, EKG not consistent with ACS  -Patient symptoms are of shortness of breath/dyspnea no notable chest pain nausea vomiting diaphoresis. -Echo pending as above   Morbid obesity BMI 40 Recommend weight loss outpatient with regular physical activity and healthy dieting.   Type 2 diabetes uncontrolled with hyperglycemia A1c 7.7 Heart healthy carb modified diet Start insulin sliding scale -initiate low-dose glargine at 25 units daily, titrate appropriately until home med rec is verified   Paroxysmal afib -Continue eliquis  History of head and neck cancer Status post laryngectomy Monitor for now, stable   OSA not on CPAP Initially refusing CPAP, order in pending patient agreement/compliance.   QTc prolongation On admission QTc 512 on twelve-lead EKG Avoid QTc prolonging agents -Lokelma x 1 today potassium 5.5, repeat pending  Azucena Fallen, DO [facility]  If 7PM-7AM, please contact night-coverage [email or web address]  09/08/2022, 7:25 AM

## 2022-09-09 ENCOUNTER — Inpatient Hospital Stay (HOSPITAL_COMMUNITY): Payer: Medicaid Other

## 2022-09-09 ENCOUNTER — Other Ambulatory Visit (HOSPITAL_COMMUNITY): Payer: Self-pay

## 2022-09-09 DIAGNOSIS — R7989 Other specified abnormal findings of blood chemistry: Secondary | ICD-10-CM | POA: Diagnosis not present

## 2022-09-09 DIAGNOSIS — J9601 Acute respiratory failure with hypoxia: Secondary | ICD-10-CM

## 2022-09-09 LAB — GLUCOSE, CAPILLARY
Glucose-Capillary: 216 mg/dL — ABNORMAL HIGH (ref 70–99)
Glucose-Capillary: 210 mg/dL — ABNORMAL HIGH (ref 70–99)
Glucose-Capillary: 267 mg/dL — ABNORMAL HIGH (ref 70–99)
Glucose-Capillary: 234 mg/dL — ABNORMAL HIGH (ref 70–99)

## 2022-09-09 LAB — ECHOCARDIOGRAM COMPLETE
AR max vel: 2.42 cm2
AV Area VTI: 2.73 cm2
AV Area mean vel: 2.4 cm2
AV Mean grad: 4 mmHg
AV Peak grad: 7.8 mmHg
Ao pk vel: 1.4 m/s
Area-P 1/2: 4.21 cm2
Height: 66 in
S' Lateral: 2.7 cm
Weight: 4091.74 oz

## 2022-09-09 LAB — STREP PNEUMONIAE URINARY ANTIGEN: Strep Pneumo Urinary Antigen: NEGATIVE

## 2022-09-09 MED ORDER — INSULIN GLARGINE-YFGN 100 UNIT/ML ~~LOC~~ SOLN
30.0000 [IU] | Freq: Every day | SUBCUTANEOUS | Status: DC
  Administered 2022-09-10: 30 [IU] via SUBCUTANEOUS
  Filled 2022-09-09: qty 0.3

## 2022-09-09 MED ORDER — PREGABALIN 75 MG PO CAPS
75.0000 mg | ORAL_CAPSULE | Freq: Every day | ORAL | Status: DC
  Administered 2022-09-09: 75 mg via ORAL
  Filled 2022-09-09: qty 1

## 2022-09-09 MED ORDER — IPRATROPIUM-ALBUTEROL 0.5-2.5 (3) MG/3ML IN SOLN
3.0000 mL | Freq: Two times a day (BID) | RESPIRATORY_TRACT | Status: DC
  Administered 2022-09-09 – 2022-09-10 (×2): 3 mL via RESPIRATORY_TRACT
  Filled 2022-09-09 (×2): qty 3

## 2022-09-09 MED ORDER — BUPRENORPHINE HCL-NALOXONE HCL 8-2 MG SL SUBL
1.0000 | SUBLINGUAL_TABLET | Freq: Three times a day (TID) | SUBLINGUAL | Status: DC
  Administered 2022-09-09 – 2022-09-10 (×2): 1 via SUBLINGUAL
  Filled 2022-09-09 (×2): qty 1

## 2022-09-09 NOTE — TOC Benefit Eligibility Note (Signed)
 Pharmacy Patient Advocate Encounter  Insurance verification completed.    The patient is insured through [facility] MEDICAID   Ran test claim for Eliquis 5 mg and the current 30 day co-pay is $4.00.   This test claim was processed through [facility]- copay amounts may vary at other pharmacies due to pharmacy/plan contracts, or as the patient moves through the different stages of their insurance plan.    Roland Earl, CPHT Pharmacy Patient Advocate Specialist [facility] Patient Advocate Team Direct Number: [phone]  Fax: [phone]

## 2022-09-09 NOTE — Progress Notes (Signed)
PROGRESS NOTE    [name]  WUJ:[id number] DOB: 12-29-1955 DOA: 09/07/2022 PCP: Sherral Hammers, FNP    Brief Narrative:  67 year old female with history of head and neck cancer status post total laryngectomy, previous smoker, type 2 diabetes, hypertension, diabetic neuropathy, fibromyalgia, peripheral vascular disease status post left BKA, sleep apnea not on CPAP and history of COPD, chronic pain syndrome and chronic shortness of breath presented with 1 day of worsening shortness of breath, cough and wheezing.  Initially requiring supplemental oxygen.  Chest x-ray showed diffuse interstitial opacities, improved from recent chest x-ray.  She was admitted to add COPD exacerbation.   Assessment & Plan:   Acute hypoxemic respiratory failure, likely multifactorial.  Suspect COPD exacerbation along with superadded pneumonia.  Less likely congestive heart failure. Continue with bronchodilator therapy, IV steroids, inhalational steroids, scheduled and as needed bronchodilators, deep breathing exercises, incentive spirometry, chest physiotherapy. Antibiotics due to severity of symptoms.  Continue Rocephin and doxycycline today. Supplemental oxygen to keep saturations more than 90%. 2D echocardiogram pending.  Apparently she responded well without diuresis.  Hold diuresis until echo results.  Elevated troponin, demand ischemia.  Troponins 531 but downtrending.  Echocardiogram pending.  Type 2 diabetes, uncontrolled with hyperglycemia: A1c 7.7.  Initiate home dose of insulin, increased dose today.  Paroxysmal A-fib: Currently in sinus rhythm.  Rate controlled.  Therapeutic on Eliquis.    DVT prophylaxis:  apixaban (ELIQUIS) tablet 5 mg   Code Status: Full code Family Communication: Son at the bedside Disposition Plan: Status is: Inpatient Remains inpatient appropriate because: IV antibiotics, steroids, still on oxygen     Consultants:  None  Procedures:  None  Antimicrobials:   Rocephin and doxycycline 7/14---   Subjective: Patient seen and examined.  Her son was at the bedside.  Patient tells me she has some chest pain with breathing but feels much better.  Unable to expectorate any sputum.  She is not able to do incentive spirometry because of laryngectomy.  Remains afebrile.  On minimal oxygen.  Appetite is fair.  She has dry cough.  Objective: Vitals:   09/09/22 0447 09/09/22 0746 09/09/22 0822 09/09/22 1016  BP: (!) 166/89     Pulse: 83  78   Resp: 16  14 18   Temp: 97.6 F (36.4 C) 98.3 F (36.8 C)    TempSrc: Oral Oral    SpO2: 95%     Weight: 116 kg     Height:        Intake/Output Summary (Last 24 hours) at 09/09/2022 1139 Last data filed at 09/09/2022 1136 Gross per 24 hour  Intake 120 ml  Output 2100 ml  Net -1980 ml   Filed Weights   09/07/22 2305 09/08/22 0444 09/09/22 0447  Weight: 113.4 kg 116.9 kg 116 kg    Examination:  General: Looks fairly comfortable.  She is able to talk in complete sentences through her voicebox. Cardiovascular: S1-S2 normal.  Regular rhythm. Respiratory: Conducted upper airway sounds.  Mostly bilaterally clear.  Laryngectomy clean and dry, no secretions Gastrointestinal: Soft.  Nontender.  Obese and pendulous. Ext: No edema.  Left BKA. Neuro: Alert awake and oriented.  Moves all extremities.   Data Reviewed: I have personally reviewed following labs and imaging studies  CBC: Recent Labs  Lab 09/07/22 2258  WBC 11.3*  HGB 17.2*  HCT 52.7*  MCV 86.5  PLT 224   Basic Metabolic Panel: Recent Labs  Lab 09/07/22 2258 09/08/22 1303  NA 137 133*  K 5.5*  3.8  CL 95* 91*  CO2 26 26  GLUCOSE 246* 400*  BUN 20 27*  CREATININE 1.28* 1.50*  CALCIUM 8.8* 9.1   GFR: Estimated Creatinine Clearance: 49 mL/min (A) (by C-G formula based on SCr of 1.5 mg/dL (H)). Liver Function Tests: No results for input(s): "AST", "ALT", "ALKPHOS", "BILITOT", "PROT", "ALBUMIN" in the last 168 hours. No results for  input(s): "LIPASE", "AMYLASE" in the last 168 hours. No results for input(s): "AMMONIA" in the last 168 hours. Coagulation Profile: No results for input(s): "INR", "PROTIME" in the last 168 hours. Cardiac Enzymes: No results for input(s): "CKTOTAL", "CKMB", "CKMBINDEX", "TROPONINI" in the last 168 hours. BNP (last 3 results) No results for input(s): "PROBNP" in the last 8760 hours. HbA1C: Recent Labs    09/08/22 0346  HGBA1C 7.7*   CBG: Recent Labs  Lab 09/08/22 1117 09/08/22 1521 09/08/22 2125 09/09/22 0622 09/09/22 1130  GLUCAP 345* 348* 269* 216* 210*   Lipid Profile: No results for input(s): "CHOL", "HDL", "LDLCALC", "TRIG", "CHOLHDL", "LDLDIRECT" in the last 72 hours. Thyroid Function Tests: No results for input(s): "TSH", "T4TOTAL", "FREET4", "T3FREE", "THYROIDAB" in the last 72 hours. Anemia Panel: No results for input(s): "VITAMINB12", "FOLATE", "FERRITIN", "TIBC", "IRON", "RETICCTPCT" in the last 72 hours. Sepsis Labs: No results for input(s): "PROCALCITON", "LATICACIDVEN" in the last 168 hours.  Recent Results (from the past 240 hour(s))  Resp panel by RT-PCR (RSV, Flu A&B, Covid) Anterior Nasal Swab     Status: None   Collection Time: 09/07/22  1:25 AM   Specimen: Anterior Nasal Swab  Result Value Ref Range Status   SARS Coronavirus 2 by RT PCR NEGATIVE NEGATIVE Final   Influenza A by PCR NEGATIVE NEGATIVE Final   Influenza B by PCR NEGATIVE NEGATIVE Final    Comment: (NOTE) The Xpert Xpress SARS-CoV-2/FLU/RSV plus assay is intended as an aid in the diagnosis of influenza from Nasopharyngeal swab specimens and should not be used as a sole basis for treatment. Nasal washings and aspirates are unacceptable for Xpert Xpress SARS-CoV-2/FLU/RSV testing.  Fact Sheet for Patients: BloggerCourse.com  Fact Sheet for Healthcare Providers: SeriousBroker.it  This test is not yet approved or cleared by the [place] FDA and has been authorized for detection and/or diagnosis of SARS-CoV-2 by FDA under an Emergency Use Authorization (EUA). This EUA will remain in effect (meaning this test can be used) for the duration of the COVID-19 declaration under Section 564(b)(1) of the Act, 21 U.S.C. section 360bbb-3(b)(1), unless the authorization is terminated or revoked.     Resp Syncytial Virus by PCR NEGATIVE NEGATIVE Final    Comment: (NOTE) Fact Sheet for Patients: BloggerCourse.com  Fact Sheet for Healthcare Providers: SeriousBroker.it  This test is not yet approved or cleared by the [place] FDA and has been authorized for detection and/or diagnosis of SARS-CoV-2 by FDA under an Emergency Use Authorization (EUA). This EUA will remain in effect (meaning this test can be used) for the duration of the COVID-19 declaration under Section 564(b)(1) of the Act, 21 U.S.C. section 360bbb-3(b)(1), unless the authorization is terminated or revoked.  Performed at [facility], [address] [place], [place] [address]   MRSA Next Gen by PCR, Nasal     Status: None   Collection Time: 09/07/22 11:00 PM  Result Value Ref Range Status   MRSA by PCR Next Gen NOT DETECTED NOT DETECTED Final    Comment: (NOTE) The GeneXpert MRSA Assay (FDA approved for NASAL specimens only), is one component of  a comprehensive MRSA colonization surveillance program. It is not intended to diagnose MRSA infection nor to guide or monitor treatment for MRSA infections. Test performance is not FDA approved in patients less than 63 years old. Performed at [facility], [address], [place], [place] [address]          Radiology Studies: DG CHEST PORT 1 VIEW  Result Date: 09/08/2022 CLINICAL DATA:  Hypoxia EXAM: PORTABLE CHEST 1 VIEW COMPARISON:  09/07/2022 FINDINGS: Stable cardiomediastinal contours. Improving aeration of the lung fields with  decreased interstitial opacities. No pleural effusion or pneumothorax. IMPRESSION: Improving aeration of the lung fields with decreased interstitial opacities. Electronically Signed   By: Duanne Guess D.O.   On: 09/08/2022 14:12   DG Chest Port 1 View  Result Date: 09/07/2022 CLINICAL DATA:  Shortness of breath EXAM: PORTABLE CHEST 1 VIEW COMPARISON:  07/03/2021 FINDINGS: Stable enlargement of the cardiomediastinal silhouette. Increased bilateral interstitial coarsening. Linear atelectasis or scarring in the left mid/upper lung. No pleural effusion or pneumothorax. No displaced rib fractures. IMPRESSION: Increased bilateral interstitial coarsening which may be due to edema or atypical infection. Electronically Signed   By: Minerva Fester M.D.   On: 09/07/2022 23:42        Scheduled Meds:  apixaban  5 mg Oral BID   arformoterol  15 mcg Nebulization BID   buprenorphine-naloxone  1 tablet Sublingual Daily   insulin aspart  0-20 Units Subcutaneous TID WC   insulin aspart  0-5 Units Subcutaneous QHS   [START ON 09/10/2022] insulin glargine-yfgn  30 Units Subcutaneous Daily   ipratropium-albuterol  3 mL Nebulization BID   methylPREDNISolone (SOLU-MEDROL) injection  40 mg Intravenous Daily   [START ON 09/10/2022] pregabalin  75 mg Oral QHS   Continuous Infusions:  cefTRIAXone (ROCEPHIN)  IV 2 g (09/09/22 0003)   doxycycline (VIBRAMYCIN) IV 100 mg (09/09/22 0956)     LOS: 1 day    Time spent: 35 minutes     Dorcas Carrow, MD [facility]

## 2022-09-09 NOTE — Inpatient Diabetes Management (Addendum)
Inpatient Diabetes Program Recommendations  AACE/ADA: New Consensus Statement on Inpatient Glycemic Control (2015)  Target Ranges:  Prepandial:   less than 140 mg/dL      Peak postprandial:   less than 180 mg/dL (1-2 hours)      Critically ill patients:  140 - 180 mg/dL   Lab Results  Component Value Date   GLUCAP 216 (H) 09/09/2022   HGBA1C 7.7 (H) 09/08/2022    Latest Reference Range & Units 09/08/22 06:33 09/08/22 11:17 09/08/22 15:21 09/08/22 21:25 09/09/22 06:22  Glucose-Capillary 70 - 99 mg/dL 952 (H) 841 (H) 324 (H) 269 (H) 216 (H)  (H): Data is abnormally high  Review of Glycemic Control  Diabetes history: DM2 Outpatient Diabetes medications: Lantus 45 units every day, Novolog 10 units tid Current orders for Inpatient glycemic control: Semglee 25 units every day, Novolog 0-20 units tid, 0-5 units hs correction, Solumedrol 40 mg qd  Inpatient Diabetes Program Recommendations:   Please consider: -Increase Semglee to 30 units every day -Add Novolog 5 units tid meal coverage if eats @ least 50% meals  Thank you, Darel Hong E. Alica Shellhammer, RN, MSN, CDE  Diabetes Coordinator Inpatient Glycemic Control Team Team Pager [phone] (8am-5pm) 09/09/2022 10:12 AM

## 2022-09-09 NOTE — Progress Notes (Signed)
Took out  trache collar, desats to 70's ,  placed back on 02, sat-90's

## 2022-09-09 NOTE — Progress Notes (Signed)
Pt states she is breathing much better today.  Fi02 weaned to 30% from 40% with patient maintaining o2 saturations of 95%.  Will continue to wean o2 as tolerated.

## 2022-09-09 NOTE — Progress Notes (Signed)
*  PRELIMINARY RESULTS* Echocardiogram 2D Echocardiogram has been performed.  [name] 09/09/2022, 10:47 AM

## 2022-09-09 NOTE — Progress Notes (Signed)
[facility] night cross cover note:   I was notified by RN that this patient, with history of chronic pain syndrome, is requesting modification to her Suboxone order to reflect her report of home dosing on a 3 times daily basis.  Suboxone in the hospital is currently ordered on a daily basis.  I subsequently placed consult to inpatient pharmacist requesting assistance with clarification of her outpatient dosing of Suboxone.  [facility] has subsequently contacted me, noting that her fill history and PDMP support TID dosing of Suboxone as an outpatient. Will increase Suboxone to her outpatient tid dosing at this time.      Newton Pigg, DO Hospitalist

## 2022-09-10 DIAGNOSIS — J9601 Acute respiratory failure with hypoxia: Secondary | ICD-10-CM | POA: Diagnosis not present

## 2022-09-10 LAB — GLUCOSE, CAPILLARY
Glucose-Capillary: 248 mg/dL — ABNORMAL HIGH (ref 70–99)
Glucose-Capillary: 231 mg/dL — ABNORMAL HIGH (ref 70–99)

## 2022-09-10 MED ORDER — STIOLTO RESPIMAT 2.5-2.5 MCG/ACT IN AERS
2.0000 | INHALATION_SPRAY | Freq: Every day | RESPIRATORY_TRACT | 2 refills | Status: DC
Start: 2022-09-10 — End: 2023-02-06

## 2022-09-10 MED ORDER — PREDNISONE 10 MG PO TABS: 20.0000 mg | ORAL_TABLET | Freq: Every day | ORAL | 0 refills | Status: AC

## 2022-09-10 MED ORDER — VENTOLIN HFA 108 (90 BASE) MCG/ACT IN AERS: 2.0000 | INHALATION_SPRAY | RESPIRATORY_TRACT | 2 refills | Status: AC | PRN

## 2022-09-10 MED ORDER — DOXYCYCLINE MONOHYDRATE 100 MG PO CAPS: 100.0000 mg | ORAL_CAPSULE | Freq: Two times a day (BID) | ORAL | 0 refills | Status: AC

## 2022-09-10 MED ORDER — GUAIFENESIN 100 MG/5ML PO LIQD: 10.0000 mL | Freq: Four times a day (QID) | ORAL | 0 refills | Status: DC | PRN

## 2022-09-10 MED ORDER — DILTIAZEM HCL ER COATED BEADS 240 MG PO CP24: 240.0000 mg | ORAL_CAPSULE | Freq: Every day | ORAL | 0 refills | Status: DC

## 2022-09-10 NOTE — Progress Notes (Signed)
Wheelchair delivered. Belongings with pt.

## 2022-09-10 NOTE — Progress Notes (Signed)
Vibramycin ivpb not completed  iv site bleeding . Md made aware gave order to cancel it.

## 2022-09-10 NOTE — TOC Transition Note (Addendum)
Transition of Care [facility]) - CM/SW Discharge Note   Patient Details  Name: [name] MRN: [id number] Date of Birth: 1955/04/10  [facility] CM/SW Contact:  Kermit Balo, RN Phone Number: 09/10/2022, 2:16 PM   Clinical Narrative:    Pt lives at home with her son. He works during the daytime. She says she manages at home during the daytime.  Pt asking for new wheelchair/ BSC/ hospital bed as hers are not either functioning or worn out. CM has sent the orders to [employer]. They will deliver the wheelchair to the room and the [facility] and bed to the patients home. CM unable to assist with medications as pt has medicaid and her co pays are already minimal.  Pts daughter to pick up son and he will get the home vehicle to provide the patient transport home.  CM consulted for no water at the home and food insecurities.  SDOH Interventions Today    Flowsheet Row Most Recent Value  SDOH Interventions   Food Insecurity Interventions Inpatient TOC, [id number] Referral  Transportation Interventions Inpatient TOC       CM provide them with the [facility] assistance program. CM sent referral through [facility]. Food bank information in the assistance packet. CM has provided number to medicaid transportation for patient to use to assist her in getting to appointments while their car is not working properly.   Final next level of care: Home/Self Care Barriers to Discharge: No Barriers Identified   Patient Goals and CMS Choice CMS Medicare.gov Compare Post Acute Care list provided to:: Patient Choice offered to / list presented to : Patient  Discharge Placement                         Discharge Plan and Services Additional resources added to the After Visit Summary for                  DME Arranged: Bedside commode, Hospital bed, Wheelchair manual DME Agency: [employer] Date DME Agency Contacted: 09/10/22   Representative spoke with at DME Agency: [email or web address]             Social Determinants of Health (SDOH) Interventions SDOH Screenings   Food Insecurity: Food Insecurity Present (09/08/2022)  Housing: Medium Risk (09/08/2022)  Transportation Needs: Unmet Transportation Needs (09/08/2022)  Utilities: At Risk (09/08/2022)  Financial Resource Strain: Not on File (06/14/2021)   Received from [place], [place]  Physical Activity: Not on File (06/14/2021)   Received from [place], [place]  Social Connections: Not on File (06/14/2021)   Received from [place], [place]  Stress: Not on File (06/14/2021)   Received from [facility], [place]  Tobacco Use: Medium Risk (09/07/2022)     Readmission Risk Interventions     No data to display

## 2022-09-10 NOTE — Progress Notes (Addendum)
    Durable Medical Equipment  (From admission, onward)           Start     Ordered   09/10/22 1408  For home use only DME standard manual wheelchair with seat cushion  Once       Comments: Patient suffers from weakness which impairs their ability to perform daily activities like bathing, dressing, and grooming in the home.  A walker will not resolve issue with performing activities of daily living. A wheelchair will allow patient to safely perform daily activities. Patient can safely propel the wheelchair in the home or has a caregiver who can provide assistance. Length of need Lifetime. Accessories: elevating leg rests (ELRs), wheel locks, extensions and anti-tippers. 22x18 size   09/10/22 1407   09/10/22 1407  For home use only DME Hospital bed  Once       Question Answer Comment  Length of Need Lifetime   The above medical condition requires: Patient requires the ability to reposition frequently   Head must be elevated greater than: 30 degrees   Bed type Semi-electric      09/10/22 1407   09/10/22 1159  For home use only DME Bedside commode  Once       Question:  Patient needs a bedside commode to treat with the following condition  Answer:  Weakness   09/10/22 1158            Patient has medical condition which requires positioning of body in ways not feasible with an ordinary bed.  Patient requires positioning of body in a way not feasible with an ordinary bed in order to alleviate pain. Patient requires frequent changes in body position and has an immediate need for a change in body position.   The patient requires a bedside commode as she is not able to make it in a timely manner to the restroom on the level of the home in which she will be staying.

## 2022-09-10 NOTE — Progress Notes (Signed)
Still on room air with sats-95%, intermittently goes down to 87 % . Denied any shortness of breath.

## 2022-09-10 NOTE — Progress Notes (Signed)
Pulse ox on room air 95% -resting.

## 2022-09-10 NOTE — Plan of Care (Signed)

## 2022-09-10 NOTE — Progress Notes (Signed)
All set for discharge, awaiting delivery of wheel chair and bedside commode.claimed that daughter is available to pick her up at 530 pm after work.

## 2022-09-10 NOTE — Discharge Summary (Signed)
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  Initially requiring supplemental oxygen.  Chest x-ray showed diffuse interstitial opacities, improved from recent chest x-ray.  She was admitted and treated as COPD exacerbation.   # Acute hypoxemic respiratory failure, likely multifactorial. COPD exacerbation with superadded pneumonia.  Less likely congestive heart failure.  Normal ejection fraction. Patient was treated with bronchodilator therapy, IV steroids, and lessening steroids and breathing exercises.  She was treated with Rocephin and doxycycline.  During course of treatment, today patient is with improved symptoms and mostly asymptomatic.  She is 95% on room air.  Blood cultures are negative.  There is no sputum production.  Chest pain has improved.  Due to good clinical response, she will go home on Prednisone 20 mg daily for next 5 days Doxycycline 100 mg twice daily for 5 days to complete 7 days of therapy Patient is already on steroid inhalers at home that she will continue. Albuterol inhaler, prescribed.   Elevated troponin, demand ischemia.  Troponins 531 but downtrending.   Echocardiogram without evidence of regional wall motion abnormality.  No more having chest pain.  EKG nonischemic.   Type 2 diabetes, uncontrolled with hyperglycemia: A1c 7.7.  Initiate home dose of insulin,.   Paroxysmal A-fib: Currently in sinus rhythm.  Rate controlled.  Therapeutic on Eliquis. Stable for discharge.  Discharge Diagnoses:  Principal Problem:   Acute hypoxic respiratory failure [facility])    Discharge Instructions  Discharge Instructions     Call MD for:  difficulty breathing, headache or visual disturbances   Complete by: As directed    Diet - low sodium heart healthy   Complete by: As directed    Diet Carb Modified   Complete by: As directed    Increase activity slowly   Complete by: As directed       Allergies as of 09/10/2022       Reactions   Ace Inhibitors Anaphylaxis, Swelling   Angioedema   Zestril [lisinopril] Anaphylaxis, Swelling   Angioedema        Medication List     STOP taking these medications    cyclobenzaprine 10 MG tablet Commonly known as: FLEXERIL   sulfamethoxazole-trimethoprim 800-160 MG tablet Commonly known as: BACTRIM DS       TAKE these medications    atorvastatin 20 MG tablet Commonly known as: LIPITOR Take 20 mg by mouth daily.   Buprenorphine HCl-Naloxone HCl 8-2 MG Film Place 1 Film under the tongue 3 (three) times daily.   doxycycline 100 MG capsule Commonly known as: MONODOX Take 1 capsule (100 mg total) by mouth 2 (two) times daily for 5 days.   Eliquis 5 MG Tabs tablet Generic drug: apixaban Take 1 tablet (5 mg total) by mouth 2 (two) times daily.   guaiFENesin 100 MG/5ML liquid Commonly known as:  ROBITUSSIN Take 10 mLs by mouth every 6 (six) hours as needed for cough or to loosen phlegm.   ibuprofen 800 MG tablet Commonly known as: ADVIL Take 800 mg by mouth every 8 (eight) hours as needed for mild pain.   insulin aspart 100 UNIT/ML FlexPen Commonly known as: NOVOLOG Inject 10 Units into  the skin 3 (three) times daily with meals.   Lantus SoloStar 100 UNIT/ML Solostar Pen Generic drug: insulin glargine Inject 45 Units into the skin daily. At 10AM   Pen Needles 3/16" 31G X 5 MM Misc Use as directed.   predniSONE 10 MG tablet Commonly known as: DELTASONE Take 2 tablets (20 mg total) by mouth daily for 5 days.   pregabalin 75 MG capsule Commonly known as: LYRICA Take 75 mg by mouth every 12 (twelve) hours.   Stiolto Respimat 2.5-2.5 MCG/ACT Aers Generic drug: Tiotropium Bromide-Olodaterol INHALE 2 PUFFS BY MOUTH INTO THE LUNGS DAILY What changed: See the new instructions.   Ventolin HFA 108 (90 Base) MCG/ACT inhaler Generic drug: albuterol Inhale 2 puffs into the lungs every 4 (four) hours as needed for wheezing or shortness of breath. What changed: See the new instructions.        Allergies  Allergen Reactions   Ace Inhibitors Anaphylaxis and Swelling    Angioedema   Zestril [Lisinopril] Anaphylaxis and Swelling    Angioedema    Consultations: None   Procedures/Studies: ECHOCARDIOGRAM COMPLETE  Result Date: 09/09/2022    ECHOCARDIOGRAM REPORT   Patient Name:   [name] [facility] Date of Exam: 09/09/2022 Medical Rec #:  [id number]    Height:       66.0 in Accession #:    [id number]   Weight:       255.7 lb Date of Birth:  [birth year]    BSA:          2.220 m Patient Age:    66 years     BP:           166/89 mmHg Patient Gender: F            HR:           95 bpm. Exam Location:  Inpatient Procedure: 2D Echo, Cardiac Doppler and Color Doppler Indications:    Elevated Troponin  History:        Patient has prior history of Echocardiogram examinations, most                 recent 07/10/2020. PAD; Risk Factors:Hypertension, Diabetes,                 Dyslipidemia and Former Smoker.  Sonographer:    Dondra Prader RVT RCS Referring Phys: [id number] Oliver Pila HALL  Sonographer Comments: Technically challenging study due to limited acoustic windows, Technically difficult study due to  poor echo windows and patient is obese. Image acquisition challenging due to patient body habitus, Image acquisition challenging due to respiratory motion and Tracheostomy dependence. Decline Definity IMPRESSIONS  1. Technically difficult study with poor visualization of intracardiac structures.  2. Left ventricular ejection fraction, by estimation, is 55 to 60%. The left ventricle has normal function. The left ventricle demonstrates regional wall motion abnormalities (see scoring diagram/findings for description). There is moderate left ventricular hypertrophy. Very mild hypokinesis of distal inferoseptal wall (poor visualization).  3. Right ventricular systolic function is normal. The right ventricular size is normal.  4. The mitral valve was not well visualized. No evidence of mitral valve regurgitation. No evidence of mitral  stenosis.  5. The aortic valve was not well visualized. Aortic valve regurgitation is not visualized. No aortic stenosis is present.  6. The inferior vena cava is normal in size with greater than 50% respiratory variability, suggesting right atrial pressure of 3 mmHg. FINDINGS  Left Ventricle: Left ventricular ejection fraction, by estimation, is 50 to 55%. The left ventricle has low normal function. The left ventricle demonstrates regional wall motion abnormalities. The left ventricular internal cavity size was normal in size. There is moderate left ventricular hypertrophy. Left ventricular diastolic parameters were normal.  LV Wall Scoring: The apical lateral segment, apical septal segment, and apex are hypokinetic. Right Ventricle: The right ventricular size is normal. No increase in right ventricular wall thickness. Right ventricular systolic function is normal. Left Atrium: Left atrial size was normal in size. Right Atrium: Right atrial size was normal in size. Pericardium: There is no evidence of pericardial effusion. Mitral Valve: The mitral valve was not well visualized. No evidence  of mitral valve regurgitation. No evidence of mitral valve stenosis. Tricuspid Valve: The tricuspid valve is not well visualized. Tricuspid valve regurgitation is not demonstrated. No evidence of tricuspid stenosis. Aortic Valve: The aortic valve was not well visualized. Aortic valve regurgitation is not visualized. No aortic stenosis is present. Aortic valve mean gradient measures 4.0 mmHg. Aortic valve peak gradient measures 7.8 mmHg. Aortic valve area, by VTI measures 2.73 cm. Pulmonic Valve: The pulmonic valve was not well visualized. Pulmonic valve regurgitation is not visualized. No evidence of pulmonic stenosis. Aorta: The aortic root is normal in size and structure. Venous: The inferior vena cava is normal in size with greater than 50% respiratory variability, suggesting right atrial pressure of 3 mmHg. IAS/Shunts: No atrial level shunt detected by color flow Doppler.  LEFT VENTRICLE PLAX 2D LVIDd:         3.80 cm LVIDs:         2.70 cm LV PW:         1.90 cm LV IVS:        1.10 cm LVOT diam:     2.00 cm LV SV:         62 LV SV Index:   28 LVOT Area:     3.14 cm  RIGHT VENTRICLE             IVC RV S prime:     17.70 cm/s  IVC diam: 1.70 cm TAPSE (M-mode): 2.2 cm LEFT ATRIUM             Index        RIGHT ATRIUM           Index LA diam:        4.30 cm 1.94 cm/m   RA Area:     12.40 cm LA Vol (A2C):   83.2 ml 37.48 ml/m  RA Volume:   25.40 ml  11.44 ml/m LA Vol (A4C):   65.4 ml 29.46 ml/m LA Biplane Vol: 81.1 ml 36.53 ml/m  AORTIC VALVE                    PULMONIC VALVE AV Area (Vmax):    2.42 cm     PV Vmax:       1.21 m/s AV Area (Vmean):   2.40 cm     PV Peak grad:  5.9 mmHg AV Area (VTI):     2.73 cm AV Vmax:           140.00 cm/s AV  Vmean:          95.000 cm/s AV VTI:            0.228 m AV Peak Grad:      7.8 mmHg AV Mean Grad:      4.0 mmHg LVOT Vmax:         108.00 cm/s LVOT Vmean:        72.600 cm/s LVOT VTI:          0.198 m LVOT/AV VTI ratio: 0.87  AORTA Ao Root diam: 2.90 cm Ao Asc diam:   4.10 cm MITRAL VALVE MV Area (PHT): 4.21 cm     SHUNTS MV Decel Time: 180 msec     Systemic VTI:  0.20 m MV E velocity: 88.70 cm/s   Systemic Diam: 2.00 cm MV A velocity: 137.00 cm/s MV E/A ratio:  0.65 Aditya Sabharwal Electronically signed by Dorthula Nettles Signature Date/Time: 09/09/2022/12:49:15 PM    Final    DG CHEST PORT 1 VIEW  Result Date: 09/08/2022 CLINICAL DATA:  Hypoxia EXAM: PORTABLE CHEST 1 VIEW COMPARISON:  09/07/2022 FINDINGS: Stable cardiomediastinal contours. Improving aeration of the lung fields with decreased interstitial opacities. No pleural effusion or pneumothorax. IMPRESSION: Improving aeration of the lung fields with decreased interstitial opacities. Electronically Signed   By: Duanne Guess D.O.   On: 09/08/2022 14:12   DG Chest Port 1 View  Result Date: 09/07/2022 CLINICAL DATA:  Shortness of breath EXAM: PORTABLE CHEST 1 VIEW COMPARISON:  07/03/2021 FINDINGS: Stable enlargement of the cardiomediastinal silhouette. Increased bilateral interstitial coarsening. Linear atelectasis or scarring in the left mid/upper lung. No pleural effusion or pneumothorax. No displaced rib fractures. IMPRESSION: Increased bilateral interstitial coarsening which may be due to edema or atypical infection. Electronically Signed   By: Minerva Fester M.D.   On: 09/07/2022 23:42   (Echo, Carotid, EGD, Colonoscopy, ERCP)    Subjective: Patient seen and examined.  Denies any complaints.  Son at the bedside.  Patient tells me she feels back to herself.  She usually gets around in a wheelchair.   Discharge Exam: Vitals:   09/10/22 0741 09/10/22 0835  BP:  132/72  Pulse:  96  Resp:  (!) 21  Temp:  98.3 F (36.8 C)  SpO2: 92% 93%   Vitals:   09/10/22 0539 09/10/22 0728 09/10/22 0741 09/10/22 0835  BP: (!) 141/77   132/72  Pulse: 77   96  Resp: 11   (!) 21  Temp: 97.7 F (36.5 C)   98.3 F (36.8 C)  TempSrc: Oral   Oral  SpO2: 95% 92% 92% 93%  Weight: 116.1 kg     Height:         General: Pt is alert, awake, not in acute distress On room air.  Able to talk in complete sentences using her voice box. Tracheostomy stoma looks clean and dry. Cardiovascular: RRR, S1/S2 +, no rubs, no gallops Respiratory: CTA bilaterally, no wheezing, no rhonchi, no added sounds. Abdominal: Soft, NT, ND, bowel sounds + Extremities: no edema, no cyanosis    The results of significant diagnostics from this hospitalization (including imaging, microbiology, ancillary and laboratory) are listed below for reference.     Microbiology: Recent Results (from the past 240 hour(s))  Resp panel by RT-PCR (RSV, Flu A&B, Covid) Anterior Nasal Swab     Status: None   Collection Time: 09/07/22  1:25 AM   Specimen: Anterior Nasal Swab  Result Value Ref Range Status   SARS Coronavirus 2 by  RT PCR NEGATIVE NEGATIVE Final   Influenza A by PCR NEGATIVE NEGATIVE Final   Influenza B by PCR NEGATIVE NEGATIVE Final    Comment: (NOTE) The Xpert Xpress SARS-CoV-2/FLU/RSV plus assay is intended as an aid in the diagnosis of influenza from Nasopharyngeal swab specimens and should not be used as a sole basis for treatment. Nasal washings and aspirates are unacceptable for Xpert Xpress SARS-CoV-2/FLU/RSV testing.  Fact Sheet for Patients: BloggerCourse.com  Fact Sheet for Healthcare Providers: SeriousBroker.it  This test is not yet approved or cleared by the [place] FDA and has been authorized for detection and/or diagnosis of SARS-CoV-2 by FDA under an Emergency Use Authorization (EUA). This EUA will remain in effect (meaning this test can be used) for the duration of the COVID-19 declaration under Section 564(b)(1) of the Act, 21 U.S.C. section 360bbb-3(b)(1), unless the authorization is terminated or revoked.     Resp Syncytial Virus by PCR NEGATIVE NEGATIVE Final    Comment: (NOTE) Fact Sheet for  Patients: BloggerCourse.com  Fact Sheet for Healthcare Providers: SeriousBroker.it  This test is not yet approved or cleared by the [place] FDA and has been authorized for detection and/or diagnosis of SARS-CoV-2 by FDA under an Emergency Use Authorization (EUA). This EUA will remain in effect (meaning this test can be used) for the duration of the COVID-19 declaration under Section 564(b)(1) of the Act, 21 U.S.C. section 360bbb-3(b)(1), unless the authorization is terminated or revoked.  Performed at [facility], [address], [place], [place] [address]   MRSA Next Gen by PCR, Nasal     Status: None   Collection Time: 09/07/22 11:00 PM  Result Value Ref Range Status   MRSA by PCR Next Gen NOT DETECTED NOT DETECTED Final    Comment: (NOTE) The GeneXpert MRSA Assay (FDA approved for NASAL specimens only), is one component of a comprehensive MRSA colonization surveillance program. It is not intended to diagnose MRSA infection nor to guide or monitor treatment for MRSA infections. Test performance is not FDA approved in patients less than 21 years old. Performed at [facility], [address] [place], [place] [address]      Labs: BNP (last 3 results) Recent Labs    09/07/22 2258  BNP 274.9*   Basic Metabolic Panel: Recent Labs  Lab 09/07/22 2258 09/08/22 1303  NA 137 133*  K 5.5* 3.8  CL 95* 91*  CO2 26 26  GLUCOSE 246* 400*  BUN 20 27*  CREATININE 1.28* 1.50*  CALCIUM 8.8* 9.1   Liver Function Tests: No results for input(s): "AST", "ALT", "ALKPHOS", "BILITOT", "PROT", "ALBUMIN" in the last 168 hours. No results for input(s): "LIPASE", "AMYLASE" in the last 168 hours. No results for input(s): "AMMONIA" in the last 168 hours. CBC: Recent Labs  Lab 09/07/22 2258  WBC 11.3*  HGB 17.2*  HCT 52.7*  MCV 86.5  PLT 224   Cardiac Enzymes: No results for input(s): "CKTOTAL", "CKMB",  "CKMBINDEX", "TROPONINI" in the last 168 hours. BNP: Invalid input(s): "POCBNP" CBG: Recent Labs  Lab 09/09/22 0622 09/09/22 1130 09/09/22 1600 09/09/22 2113 09/10/22 0627  GLUCAP 216* 210* 267* 234* 248*   D-Dimer Recent Labs    09/08/22 0121  DDIMER 0.43   Hgb A1c Recent Labs    09/08/22 0346  HGBA1C 7.7*   Lipid Profile No results for input(s): "CHOL", "HDL", "LDLCALC", "TRIG", "CHOLHDL", "LDLDIRECT" in the last 72 hours. Thyroid function studies No results for input(s): "TSH", "T4TOTAL", "T3FREE", "THYROIDAB" in  the last 72 hours.  Invalid input(s): "FREET3" Anemia work up No results for input(s): "VITAMINB12", "FOLATE", "FERRITIN", "TIBC", "IRON", "RETICCTPCT" in the last 72 hours. Urinalysis    Component Value Date/Time   COLORURINE YELLOW 07/03/2021 2032   APPEARANCEUR HAZY (A) 07/03/2021 2032   LABSPEC 1.015 07/03/2021 2032   PHURINE 8.0 07/03/2021 2032   GLUCOSEU NEGATIVE 07/03/2021 2032   HGBUR SMALL (A) 07/03/2021 2032   BILIRUBINUR NEGATIVE 07/03/2021 2032   KETONESUR NEGATIVE 07/03/2021 2032   PROTEINUR 100 (A) 07/03/2021 2032   UROBILINOGEN 0.2 03/20/2014 0237   NITRITE POSITIVE (A) 07/03/2021 2032   LEUKOCYTESUR LARGE (A) 07/03/2021 2032   Sepsis Labs Recent Labs  Lab 09/07/22 2258  WBC 11.3*   Microbiology Recent Results (from the past 240 hour(s))  Resp panel by RT-PCR (RSV, Flu A&B, Covid) Anterior Nasal Swab     Status: None   Collection Time: 09/07/22  1:25 AM   Specimen: Anterior Nasal Swab  Result Value Ref Range Status   SARS Coronavirus 2 by RT PCR NEGATIVE NEGATIVE Final   Influenza A by PCR NEGATIVE NEGATIVE Final   Influenza B by PCR NEGATIVE NEGATIVE Final    Comment: (NOTE) The Xpert Xpress SARS-CoV-2/FLU/RSV plus assay is intended as an aid in the diagnosis of influenza from Nasopharyngeal swab specimens and should not be used as a sole basis for treatment. Nasal washings and aspirates are unacceptable for Xpert Xpress  SARS-CoV-2/FLU/RSV testing.  Fact Sheet for Patients: BloggerCourse.com  Fact Sheet for Healthcare Providers: SeriousBroker.it  This test is not yet approved or cleared by the [place] FDA and has been authorized for detection and/or diagnosis of SARS-CoV-2 by FDA under an Emergency Use Authorization (EUA). This EUA will remain in effect (meaning this test can be used) for the duration of the COVID-19 declaration under Section 564(b)(1) of the Act, 21 U.S.C. section 360bbb-3(b)(1), unless the authorization is terminated or revoked.     Resp Syncytial Virus by PCR NEGATIVE NEGATIVE Final    Comment: (NOTE) Fact Sheet for Patients: BloggerCourse.com  Fact Sheet for Healthcare Providers: SeriousBroker.it  This test is not yet approved or cleared by the [place] FDA and has been authorized for detection and/or diagnosis of SARS-CoV-2 by FDA under an Emergency Use Authorization (EUA). This EUA will remain in effect (meaning this test can be used) for the duration of the COVID-19 declaration under Section 564(b)(1) of the Act, 21 U.S.C. section 360bbb-3(b)(1), unless the authorization is terminated or revoked.  Performed at [facility], [address] [place], [place] [address]   MRSA Next Gen by PCR, Nasal     Status: None   Collection Time: 09/07/22 11:00 PM  Result Value Ref Range Status   MRSA by PCR Next Gen NOT DETECTED NOT DETECTED Final    Comment: (NOTE) The GeneXpert MRSA Assay (FDA approved for NASAL specimens only), is one component of a comprehensive MRSA colonization surveillance program. It is not intended to diagnose MRSA infection nor to guide or monitor treatment for MRSA infections. Test performance is not FDA approved in patients less than 6 years old. Performed at [facility], [address], [place], [place] [address]       Time coordinating discharge: 35 minutes  SIGNED:   Dorcas Carrow, MD  [facility] 09/10/2022, 10:48 AM

## 2022-09-11 LAB — LEGIONELLA PNEUMOPHILA SEROGP 1 UR AG: L. pneumophila Serogp 1 Ur Ag: NEGATIVE

## 2023-02-06 ENCOUNTER — Telehealth: Payer: Self-pay

## 2023-02-06 ENCOUNTER — Telehealth: Payer: Medicare Other | Admitting: Emergency Medicine

## 2023-02-06 ENCOUNTER — Encounter: Payer: Self-pay | Admitting: Emergency Medicine

## 2023-02-06 DIAGNOSIS — C3492 Malignant neoplasm of unspecified part of left bronchus or lung: Secondary | ICD-10-CM | POA: Diagnosis not present

## 2023-02-06 DIAGNOSIS — J449 Chronic obstructive pulmonary disease, unspecified: Secondary | ICD-10-CM

## 2023-02-06 NOTE — Assessment & Plan Note (Signed)
Her Stiolto was changed to [employer] for insurance reasons when her insurance changed.  She is not benefiting from it and clearly this is not a good choice for her because it is a powdered formulation and she has a stoma.  She will need to be changed to a nonpowdered inhaler, preferably LABA/LAMA.  She tolerates ICS poorly, has had thrush and yeast infections at her stoma site.  I will ask our clinical pharmacist to help [place] with this, work on a test case and any appropriate appeal or prior authorization that needs to happen to get her back on Stiolto.  If we cannot accomplish this then we may have to consider changing her to long-acting nebulized BD.  Her flu shot is up-to-date.  I will see her in 6 months

## 2023-02-06 NOTE — Progress Notes (Signed)
Virtual Visit via Video Note  I connected with [name] on 02/06/23 at  9:15 AM EST by a video enabled telemedicine application and verified that I am speaking with the correct person using two identifiers.  Location: Patient: Home Provider: Office   I discussed the limitations of evaluation and management by telemedicine and the availability of in person appointments. The patient expressed understanding and agreed to proceed.  History of Present Illness: Ms. 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    Currently managed on Breo.  Today she reports that her Stiolto to [employer] for insurance reasons. She has difficulty due to her stoma also she is not getting same clinical benefit. She has daily cough, especially in th am. Uses albuterol 2-3 times a day. Flu shot up to date.   She had a flare about 2-3 months ago. Better after pred and abx.    Observations/Objective: Reassuring Ct chest 06/2021.   Assessment and Plan: COPD (chronic obstructive pulmonary disease) (HCC) Her Stiolto was changed to [employer] for insurance reasons when her insurance changed.  She is not benefiting from it and clearly this is not a good choice for her because it is a powdered formulation and she has a stoma.  She will need to be changed to a nonpowdered inhaler, preferably LABA/LAMA.  She tolerates ICS poorly, has had thrush and yeast infections at her stoma site.  I will ask our clinical pharmacist to help [place] with this, work on a test case and any appropriate appeal or prior authorization that needs to happen to get her back on Stiolto.  If we cannot accomplish this then we may have to consider changing her to long-acting nebulized BD.  Her flu shot is up-to-date.  I will see her in 6 months  Non-small cell cancer of left  lung (HCC) 2017 with stable imaging in 06/2021.  I do not think we need to do any more serial imaging.   Follow Up Instructions: 6 months   I discussed the assessment and treatment plan with the patient. The patient was provided an opportunity to ask questions and all were answered. The patient agreed with the plan and demonstrated an understanding of the instructions.   The patient was advised to call back or seek an in-person evaluation if the symptoms worsen or if the condition fails to improve as anticipated.  I provided 20 minutes of non-face-to-face time during this encounter.   Leslye Peer, MD

## 2023-02-06 NOTE — Assessment & Plan Note (Signed)
2017 with stable imaging in 06/2021.  I do not think we need to do any more serial imaging.

## 2023-02-07 ENCOUNTER — Telehealth: Payer: Self-pay

## 2023-02-07 ENCOUNTER — Telehealth: Payer: Medicaid Other | Admitting: Emergency Medicine

## 2023-02-07 NOTE — Telephone Encounter (Signed)
Patient is currently on Breo. She cannot tolerate the powder inhalers. Patient needs a powderless inhaler. Routing to pharmacy to see what powderless inhaler her insurance will cover.

## 2023-02-11 ENCOUNTER — Other Ambulatory Visit (HOSPITAL_COMMUNITY): Payer: Self-pay

## 2023-02-11 NOTE — Telephone Encounter (Signed)
Metered dose inhalers in the same class as Breo that are covered are as follows:   Advair HFA- $4.60 Dulera- $4.60

## 2023-04-22 ENCOUNTER — Telehealth: Payer: Self-pay | Admitting: Emergency Medicine

## 2023-04-22 NOTE — Telephone Encounter (Signed)
 Patient's albuterol will no longer be covered by insurance. Her Maurilio Lovely is giving her thrush. Are there any alternatives for the patient to try? Please call and advise [phone]

## 2023-04-24 NOTE — Telephone Encounter (Signed)
 I called and spoke with pt. Pt states she talked to Dr Delton Coombes and he asked her if she ever had thrush with the [facility] and she said no. Pt states she has now developed thrush and needs something to treat the thrush, and states her insurance no longer covers albuterol. Pt states she needs new alternatives for the Breo and Albuterol, and a rx to treat thrush. Routing to Dr Delton Coombes.

## 2023-04-24 NOTE — Telephone Encounter (Signed)
 This patient should definitely not be on Breo.  We discussed this at her last visit, but she has a stoma post laryngectomy.  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  I had hoped we can get her on Stiolto.  I would try Stiolto if at all possible.  May need to check with our pharmacist to see if it is covered  With regard to the albuterol, usually this is covered by all insurances but it is possible that her insurance is now only covering generic.  We would need to check her formulary to see what to order.  It may be that the best thing for [place] to do is refer this to clinical pharmacy and have them do a test case and see what is actually covered both for the Stiolto and the albuterol.  Please give her fluconazole 100 mg daily for 3 days for the thrush.

## 2023-04-25 ENCOUNTER — Other Ambulatory Visit (HOSPITAL_COMMUNITY): Payer: Self-pay

## 2023-04-25 ENCOUNTER — Telehealth: Payer: Self-pay

## 2023-04-25 MED ORDER — FLUCONAZOLE 100 MG PO TABS: 100.0000 mg | ORAL_TABLET | Freq: Every day | ORAL | 0 refills | Status: AC

## 2023-04-25 NOTE — Telephone Encounter (Signed)
 I called and spoke with pt. I informed her of Dr Neville Route message. Pt verbalized understanding. I informed pt I would route to pharmacy team to inform us if Stiolto is covered. Fluconazole rx sent.

## 2023-04-25 NOTE — Telephone Encounter (Signed)
 Per test claims Stiolto is not covered but the alternative LAMA+LABA are Anoro Ellipta ($4.80) and Bevespi ($4.80)   *test claims run through [employer]

## 2023-04-25 NOTE — Telephone Encounter (Signed)
*  sent to pulm in original message  Per test claims Stiolto is not covered but the alternative LAMA+LABA are Anoro Ellipta ($4.80) and Bevespi ($4.80)

## 2023-04-25 NOTE — Telephone Encounter (Signed)
 Dr Delton Coombes, can you do the alternative?

## 2023-04-28 MED ORDER — BEVESPI AEROSPHERE 9-4.8 MCG/ACT IN AERO: 2.0000 | INHALATION_SPRAY | Freq: Two times a day (BID) | RESPIRATORY_TRACT | 11 refills | Status: AC

## 2023-04-28 NOTE — Addendum Note (Signed)
 Addended by: Lajoyce Lauber A on: 04/28/2023 10:39 AM   Modules accepted: Orders

## 2023-04-28 NOTE — Telephone Encounter (Signed)
 Thank you for checking this - it needs to be bevespi 2 puff bid

## 2023-04-28 NOTE — Telephone Encounter (Signed)
 Pt is aware of below message and voiced her understanding.  Bevespi has been sent to preferred pharmacy.  She stated that insurance does not cover albuterol.   PA team, can you run a test claim for albuterol.

## 2023-04-29 ENCOUNTER — Other Ambulatory Visit (HOSPITAL_COMMUNITY): Payer: Self-pay

## 2023-04-29 NOTE — Telephone Encounter (Signed)
 Lm x1 for patient.

## 2023-04-29 NOTE — Telephone Encounter (Signed)
 Test claim shows that Brand Ventolin HFA is covered with a $4.80 co-pay

## 2023-05-07 NOTE — Telephone Encounter (Signed)
 Pt is aware of below message and voiced her understanding. Nothing further needed.

## 2023-07-29 ENCOUNTER — Other Ambulatory Visit: Payer: Self-pay | Admitting: Emergency Medicine

## 2023-07-29 NOTE — Telephone Encounter (Deleted)
 Copied from CRM [id number]. Topic: Clinical - Medication Refill >> Jul 29, 2023 12:22 PM Tianna S wrote: Medication: albuterol  (VENTOLIN  HFA) 108 (90 Base) MCG/ACT inhaler  Has the patient contacted their pharmacy? Yes (Agent: If no, request that the patient contact the pharmacy for the refill. If patient does not wish to contact the pharmacy document the reason why and proceed with request.) (Agent: If yes, when and what did the pharmacy advise?)  This is the patient's preferred pharmacy:  [facility] [phone] [name], [place] - [address] [place] [address] Phone: [phone] Fax: [phone]   Is this the correct pharmacy for this prescription? Yes If no, delete pharmacy and type the correct one.   Has the prescription been filled recently? No  Is the patient out of the medication? Yes  Has the patient been seen for an appointment in the last year OR does the patient have an upcoming appointment? Yes  Can we respond through MyChart? Yes  Agent: Please be advised that Rx refills may take up to 3 business days. We ask that you follow-up with your pharmacy.  Patient doesn't see the original provider who prescribed this and would like dr byrum to fill since she seen him 01/2023.

## 2023-07-29 NOTE — Telephone Encounter (Signed)
 Copied from CRM [id number]. Topic: Clinical - Medication Refill >> Jul 29, 2023 12:22 PM Tianna S wrote: Medication: albuterol  (VENTOLIN  HFA) 108 (90 Base) MCG/ACT inhaler  Has the patient contacted their pharmacy? Yes (Agent: If no, request that the patient contact the pharmacy for the refill. If patient does not wish to contact the pharmacy document the reason why and proceed with request.) (Agent: If yes, when and what did the pharmacy advise?)  This is the patient's preferred pharmacy:  [facility] [phone] [name], [place] - [address] [place] [address] Phone: [phone] Fax: [phone]   Is this the correct pharmacy for this prescription? Yes If no, delete pharmacy and type the correct one.   Has the prescription been filled recently? No  Is the patient out of the medication? Yes  Has the patient been seen for an appointment in the last year OR does the patient have an upcoming appointment? Yes  Can we respond through MyChart? Yes  Agent: Please be advised that Rx refills may take up to 3 business days. We ask that you follow-up with your pharmacy.  Patient doesn't see the original provider who prescribed this and would like dr byrum to fill since she seen him 01/2023.

## 2023-07-30 ENCOUNTER — Inpatient Hospital Stay (HOSPITAL_COMMUNITY)
Admission: EM | Admit: 2023-07-30 | Discharge: 2023-08-04 | DRG: 291 | Disposition: A | Discharge status: Home / Self Care | Attending: Internal Medicine | Admitting: Internal Medicine

## 2023-07-30 ENCOUNTER — Emergency Department (HOSPITAL_COMMUNITY)

## 2023-07-30 ENCOUNTER — Encounter (HOSPITAL_COMMUNITY): Payer: Self-pay | Admitting: Emergency Medicine

## 2023-07-30 DIAGNOSIS — N1831 Chronic kidney disease, stage 3a: Secondary | ICD-10-CM | POA: Diagnosis present

## 2023-07-30 DIAGNOSIS — E1121 Type 2 diabetes mellitus with diabetic nephropathy: Secondary | ICD-10-CM | POA: Diagnosis present

## 2023-07-30 DIAGNOSIS — J441 Chronic obstructive pulmonary disease with (acute) exacerbation: Principal | ICD-10-CM | POA: Diagnosis present

## 2023-07-30 DIAGNOSIS — I509 Heart failure, unspecified: Secondary | ICD-10-CM | POA: Diagnosis present

## 2023-07-30 DIAGNOSIS — E1165 Type 2 diabetes mellitus with hyperglycemia: Secondary | ICD-10-CM | POA: Diagnosis present

## 2023-07-30 DIAGNOSIS — Z85819 Personal history of malignant neoplasm of unspecified site of lip, oral cavity, and pharynx: Secondary | ICD-10-CM

## 2023-07-30 DIAGNOSIS — I484 Atypical atrial flutter: Secondary | ICD-10-CM | POA: Diagnosis present

## 2023-07-30 DIAGNOSIS — M797 Fibromyalgia: Secondary | ICD-10-CM | POA: Diagnosis present

## 2023-07-30 DIAGNOSIS — Z794 Long term (current) use of insulin: Secondary | ICD-10-CM | POA: Diagnosis not present

## 2023-07-30 DIAGNOSIS — E1151 Type 2 diabetes mellitus with diabetic peripheral angiopathy without gangrene: Secondary | ICD-10-CM | POA: Diagnosis present

## 2023-07-30 DIAGNOSIS — J449 Chronic obstructive pulmonary disease, unspecified: Secondary | ICD-10-CM | POA: Diagnosis present

## 2023-07-30 DIAGNOSIS — E1122 Type 2 diabetes mellitus with diabetic chronic kidney disease: Secondary | ICD-10-CM | POA: Diagnosis present

## 2023-07-30 DIAGNOSIS — I739 Peripheral vascular disease, unspecified: Secondary | ICD-10-CM | POA: Diagnosis present

## 2023-07-30 DIAGNOSIS — Z888 Allergy status to other drugs, medicaments and biological substances status: Secondary | ICD-10-CM

## 2023-07-30 DIAGNOSIS — I5033 Acute on chronic diastolic (congestive) heart failure: Secondary | ICD-10-CM

## 2023-07-30 DIAGNOSIS — Z930 Tracheostomy status: Secondary | ICD-10-CM | POA: Diagnosis not present

## 2023-07-30 DIAGNOSIS — G4733 Obstructive sleep apnea (adult) (pediatric): Secondary | ICD-10-CM | POA: Diagnosis present

## 2023-07-30 DIAGNOSIS — Z8589 Personal history of malignant neoplasm of other organs and systems: Secondary | ICD-10-CM

## 2023-07-30 DIAGNOSIS — N179 Acute kidney failure, unspecified: Secondary | ICD-10-CM | POA: Diagnosis not present

## 2023-07-30 DIAGNOSIS — Z79899 Other long term (current) drug therapy: Secondary | ICD-10-CM | POA: Diagnosis not present

## 2023-07-30 DIAGNOSIS — Z89512 Acquired absence of left leg below knee: Secondary | ICD-10-CM

## 2023-07-30 DIAGNOSIS — Z604 Social exclusion and rejection: Secondary | ICD-10-CM | POA: Diagnosis present

## 2023-07-30 DIAGNOSIS — I480 Paroxysmal atrial fibrillation: Secondary | ICD-10-CM | POA: Diagnosis not present

## 2023-07-30 DIAGNOSIS — J9621 Acute and chronic respiratory failure with hypoxia: Secondary | ICD-10-CM | POA: Diagnosis present

## 2023-07-30 DIAGNOSIS — T45516A Underdosing of anticoagulants, initial encounter: Secondary | ICD-10-CM | POA: Diagnosis present

## 2023-07-30 DIAGNOSIS — E1140 Type 2 diabetes mellitus with diabetic neuropathy, unspecified: Secondary | ICD-10-CM | POA: Diagnosis present

## 2023-07-30 DIAGNOSIS — I130 Hypertensive heart and chronic kidney disease with heart failure and stage 1 through stage 4 chronic kidney disease, or unspecified chronic kidney disease: Principal | ICD-10-CM | POA: Diagnosis present

## 2023-07-30 DIAGNOSIS — Z5982 Transportation insecurity: Secondary | ICD-10-CM

## 2023-07-30 DIAGNOSIS — J9622 Acute and chronic respiratory failure with hypercapnia: Secondary | ICD-10-CM | POA: Diagnosis present

## 2023-07-30 DIAGNOSIS — Z87891 Personal history of nicotine dependence: Secondary | ICD-10-CM | POA: Diagnosis not present

## 2023-07-30 DIAGNOSIS — N261 Atrophy of kidney (terminal): Secondary | ICD-10-CM | POA: Diagnosis present

## 2023-07-30 DIAGNOSIS — I358 Other nonrheumatic aortic valve disorders: Secondary | ICD-10-CM | POA: Diagnosis present

## 2023-07-30 DIAGNOSIS — Z79891 Long term (current) use of opiate analgesic: Secondary | ICD-10-CM

## 2023-07-30 DIAGNOSIS — I4819 Other persistent atrial fibrillation: Secondary | ICD-10-CM | POA: Diagnosis present

## 2023-07-30 DIAGNOSIS — Z87442 Personal history of urinary calculi: Secondary | ICD-10-CM

## 2023-07-30 DIAGNOSIS — G894 Chronic pain syndrome: Secondary | ICD-10-CM | POA: Diagnosis present

## 2023-07-30 DIAGNOSIS — Z7901 Long term (current) use of anticoagulants: Secondary | ICD-10-CM

## 2023-07-30 DIAGNOSIS — Z66 Do not resuscitate: Secondary | ICD-10-CM | POA: Diagnosis present

## 2023-07-30 DIAGNOSIS — Z91128 Patient's intentional underdosing of medication regimen for other reason: Secondary | ICD-10-CM

## 2023-07-30 DIAGNOSIS — I4891 Unspecified atrial fibrillation: Secondary | ICD-10-CM

## 2023-07-30 DIAGNOSIS — Z5941 Food insecurity: Secondary | ICD-10-CM

## 2023-07-30 LAB — CBC WITH DIFFERENTIAL/PLATELET
Abs Immature Granulocytes: 0.04 10*3/uL (ref 0.00–0.07)
Basophils Absolute: 0.1 10*3/uL (ref 0.0–0.1)
Basophils Relative: 1 %
Eosinophils Absolute: 0.2 10*3/uL (ref 0.0–0.5)
Eosinophils Relative: 2 %
HCT: 47.3 % — ABNORMAL HIGH (ref 36.0–46.0)
Hemoglobin: 15.2 g/dL — ABNORMAL HIGH (ref 12.0–15.0)
Immature Granulocytes: 1 %
Lymphocytes Relative: 22 %
Lymphs Abs: 1.7 10*3/uL (ref 0.7–4.0)
MCH: 29.2 pg (ref 26.0–34.0)
MCHC: 32.1 g/dL (ref 30.0–36.0)
MCV: 91 fL (ref 80.0–100.0)
Monocytes Absolute: 0.3 10*3/uL (ref 0.1–1.0)
Monocytes Relative: 4 %
Neutro Abs: 5.4 10*3/uL (ref 1.7–7.7)
Neutrophils Relative %: 70 %
Platelets: 233 10*3/uL (ref 150–400)
RBC: 5.2 MIL/uL — ABNORMAL HIGH (ref 3.87–5.11)
RDW: 12.9 % (ref 11.5–15.5)
WBC: 7.7 10*3/uL (ref 4.0–10.5)
nRBC: 0 % (ref 0.0–0.2)

## 2023-07-30 LAB — I-STAT CHEM 8, ED
BUN: 22 mg/dL (ref 8–23)
Calcium, Ion: 1.09 mmol/L — ABNORMAL LOW (ref 1.15–1.40)
Chloride: 101 mmol/L (ref 98–111)
Creatinine, Ser: 1.1 mg/dL — ABNORMAL HIGH (ref 0.44–1.00)
Glucose, Bld: 250 mg/dL — ABNORMAL HIGH (ref 70–99)
HCT: 47 % — ABNORMAL HIGH (ref 36.0–46.0)
Hemoglobin: 16 g/dL — ABNORMAL HIGH (ref 12.0–15.0)
Potassium: 3.8 mmol/L (ref 3.5–5.1)
Sodium: 140 mmol/L (ref 135–145)
TCO2: 27 mmol/L (ref 22–32)

## 2023-07-30 LAB — BASIC METABOLIC PANEL WITH GFR
Anion gap: 12 (ref 5–15)
BUN: 19 mg/dL (ref 8–23)
CO2: 28 mmol/L (ref 22–32)
Calcium: 8.6 mg/dL — ABNORMAL LOW (ref 8.9–10.3)
Chloride: 99 mmol/L (ref 98–111)
Creatinine, Ser: 1.12 mg/dL — ABNORMAL HIGH (ref 0.44–1.00)
GFR, Estimated: 55 mL/min — ABNORMAL LOW (ref >60)
Glucose, Bld: 247 mg/dL — ABNORMAL HIGH (ref 70–99)
Potassium: 3.7 mmol/L (ref 3.5–5.1)
Sodium: 139 mmol/L (ref 135–145)

## 2023-07-30 LAB — TROPONIN I (HIGH SENSITIVITY)
Troponin I (High Sensitivity): 16 ng/L (ref <18)
Troponin I (High Sensitivity): 16 ng/L (ref <18)

## 2023-07-30 LAB — BRAIN NATRIURETIC PEPTIDE: B Natriuretic Peptide: 151.3 pg/mL — ABNORMAL HIGH (ref 0.0–100.0)

## 2023-07-30 MED ORDER — DILTIAZEM LOAD VIA INFUSION
10.0000 mg | Freq: Once | INTRAVENOUS | Status: AC
  Administered 2023-07-30: 10 mg via INTRAVENOUS
  Filled 2023-07-30: qty 10

## 2023-07-30 MED ORDER — DILTIAZEM HCL-DEXTROSE 125-5 MG/125ML-% IV SOLN (PREMIX)
5.0000 mg/h | INTRAVENOUS | Status: DC
  Administered 2023-07-30: 5 mg/h via INTRAVENOUS
  Administered 2023-07-30 – 2023-07-31 (×2): 15 mg/h via INTRAVENOUS
  Filled 2023-07-30 (×3): qty 125

## 2023-07-30 MED ORDER — FUROSEMIDE 10 MG/ML IJ SOLN
40.0000 mg | Freq: Two times a day (BID) | INTRAMUSCULAR | Status: DC
  Administered 2023-07-30 – 2023-08-01 (×4): 40 mg via INTRAVENOUS
  Filled 2023-07-30 (×4): qty 4

## 2023-07-30 MED ORDER — ALBUTEROL SULFATE (2.5 MG/3ML) 0.083% IN NEBU
5.0000 mg | INHALATION_SOLUTION | Freq: Once | RESPIRATORY_TRACT | Status: AC
  Administered 2023-07-30: 5 mg via RESPIRATORY_TRACT
  Filled 2023-07-30: qty 6

## 2023-07-30 MED ORDER — METHYLPREDNISOLONE SODIUM SUCC 125 MG IJ SOLR
125.0000 mg | Freq: Once | INTRAMUSCULAR | Status: AC
  Administered 2023-07-30: 125 mg via INTRAVENOUS
  Filled 2023-07-30: qty 2

## 2023-07-30 MED ORDER — FUROSEMIDE 10 MG/ML IJ SOLN
40.0000 mg | Freq: Once | INTRAMUSCULAR | Status: AC
  Administered 2023-07-30: 40 mg via INTRAVENOUS
  Filled 2023-07-30: qty 4

## 2023-07-30 NOTE — ED Provider Notes (Signed)
   ED Course / MDM   Clinical Course as of 07/30/23 1750  Wed Jul 30, 2023  1554 Received sign out from Dr. Nolia Baumgartner, presenting with shortness of breath. Has pulmonary edema and mild BNP elevation. Also in afib with RVR on diltiazem  drip. On 4 L trach collar which is new.  [WS]  1749 Patient signed out to medicine [WS]    Clinical Course User Index [WS] Mordecai Applebaum, MD   Medical Decision Making Amount and/or Complexity of Data Reviewed Labs: ordered. Radiology: ordered.  Risk Prescription drug management.        Mordecai Applebaum, MD 07/30/23 1750

## 2023-07-30 NOTE — H&P (Signed)
 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  She reports chronic orthopnea at baseline.  Mild chronic cough is unchanged, intermittent wheezing reported as well, denies fevers chills or increased secretions at this time.  Previously was on Lasix  but has not taken this for years ED Course: Noted to be in A-fib with RVR, volume overloaded, mildly hypoxic placed on 2 L O2 via trach collar, labs noted creatinine 1.1, glucose 247, BNP 151, troponin 16, hemoglobin 15. - Chest x-ray with pulmonary vascular congestion and CHF changes, EKG noted A-fib RVR, given IV Lasix  and started on Cardizem  gtt.  Review of Systems: As per HPI otherwise 14point review of systems negative.   Past Medical History:  Diagnosis Date   Anxiety    Arthritis    COPD (chronic obstructive pulmonary disease) (HCC)    Depression    Diabetes mellitus without complication (HCC)    INSULIN  DEPENDENT   Diabetic neuropathy (HCC)    Difficult intubation    total laryngectomy   Fibromyalgia    Head and neck cancer (HCC) 04/19/2015   Hypertension    Kidney stone    Morbid obesity with BMI of 40.0-44.9, adult (HCC)    Neuropathy    Nocturia    Pneumonia    Rash, skin    face   Renal insufficiency    Shortness of breath    Sleep apnea    has not used cpap past 10 yrs   Urge incontinence     Past Surgical History:  Procedure Laterality Date   AMPUTATION Left 08/25/2015   Procedure: 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;  Surgeon:  Timothy Ford, MD;  Location: [facility] OR;  Service: Orthopedics;  Laterality: Left;   AMPUTATION Left 09/13/2015   Procedure: AMPUTATION BELOW KNEE;  Surgeon: Adah Acron, MD;  Location: [facility] OR;  Service: Orthopedics;  Laterality: Left;   BRAIN SURGERY     vertebral aneurysm   BREAST LUMPECTOMY  1982   benign   CARPEL TUNNEL     CHOLECYSTECTOMY     CYSTOSCOPY W/ URETERAL STENT PLACEMENT Left 03/22/2014   Procedure: CYSTOSCOPY WITH RETROGRADE PYELOGRAM/URETERAL STENT PLACEMENT;  Surgeon: Osborn Blaze, MD;  Location: [facility] OR;  Service: Urology;  Laterality: Left;   CYSTOSCOPY WITH RETROGRADE PYELOGRAM, URETEROSCOPY AND STENT PLACEMENT Left 05/11/2014   Procedure: CYSTOSCOPY WITH RETROGRADE PYELOGRAM, URETEROSCOPY AND STENT PLACEMENT;  Surgeon: Osborn Blaze, MD;  Location: [facility];  Service: Urology;  Laterality: Left;   CYSTOSCOPY WITH STENT PLACEMENT Right 07/04/2021   Procedure: Cystoscopy with right retrograde pyelogram and right ureteral stent placement;  Surgeon: Samson Croak, MD;  Location: [facility] OR;  Service: Urology;  Laterality: Right;   CYSTOSCOPY/URETEROSCOPY/HOLMIUM LASER Right 08/10/2021   Procedure: CYSTOSCOPY RIGHT AND STENT PLACEMENT;  Surgeon: Samson Croak, MD;  Location: [facility];  Service: Urology;  Laterality: Right;   HOLMIUM LASER APPLICATION Left 05/11/2014   Procedure: HOLMIUM LASER APPLICATION;  Surgeon: Osborn Blaze, MD;  Location: [facility];  Service: Urology;  Laterality: Left;   LARYNGECTOMY  total ( stoma)   LEG AMPUTATION BELOW KNEE Left 09/13/2015   SALIVARY STONE REMOVAL     TONSILLECTOMY     TUBAL LIGATION       reports that she quit smoking about 8 years ago. Her smoking use included cigarettes. She started smoking about 48 years ago. She has a 40 pack-year smoking history. She has never used smokeless tobacco. She reports that she does not drink alcohol and does not use drugs.  Allergies  Allergen Reactions   Ace Inhibitors Anaphylaxis and Swelling     Angioedema   Zestril [Lisinopril] Anaphylaxis and Swelling    Angioedema    Family History  Adopted: Yes     Prior to Admission medications   Medication Sig Start Date End Date Taking? Authorizing Provider  albuterol  (VENTOLIN  HFA) 108 (90 Base) MCG/ACT inhaler Inhale 2 puffs into the lungs every 4 (four) hours as needed for wheezing or shortness of breath. 09/10/22   Vada Garibaldi, MD  apixaban  (ELIQUIS ) 5 MG TABS tablet Take 1 tablet (5 mg total) by mouth 2 (two) times daily. 07/06/21 09/08/22  Modena Andes, MD  atorvastatin  (LIPITOR) 20 MG tablet Take 20 mg by mouth daily. 04/13/20   [provider]  Buprenorphine  HCl-Naloxone  HCl 8-2 MG FILM Place 1 Film under the tongue 3 (three) times daily.     [provider]  diltiazem  (CARDIZEM  CD) 240 MG 24 hr capsule Take 1 capsule (240 mg total) by mouth daily. 09/10/22 12/09/22  Vada Garibaldi, MD  Glycopyrrolate-Formoterol  (BEVESPI  AEROSPHERE) 9-4.8 MCG/ACT AERO Inhale 2 puffs into the lungs 2 (two) times daily. 04/28/23   Byrum, Robert S, MD  guaiFENesin  (ROBITUSSIN) 100 MG/5ML liquid Take 10 mLs by mouth every 6 (six) hours as needed for cough or to loosen phlegm. 09/10/22   Vada Garibaldi, MD  ibuprofen  (ADVIL ) 800 MG tablet Take 800 mg by mouth every 8 (eight) hours as needed for mild pain. 07/24/21   [provider]  insulin  aspart (NOVOLOG ) 100 UNIT/ML FlexPen Inject 10 Units into the skin 3 (three) times daily with meals. 07/12/20   Oral Billings, MD  insulin  glargine (LANTUS  SOLOSTAR) 100 UNIT/ML Solostar Pen Inject 45 Units into the skin daily. At 10AM 07/12/20   Oral Billings, MD  Insulin  Pen Needle (PEN NEEDLES 3/16") 31G X 5 MM MISC Use as directed. 03/24/14   Krishnan, Gokul, MD  pregabalin  (LYRICA ) 75 MG capsule Take 75 mg by mouth every 12 (twelve) hours. 07/10/21   [provider]    Physical Exam: Vitals:   07/30/23 1515 07/30/23 1530 07/30/23 1545 07/30/23 1705  BP: (!) 160/94   (!) 182/98   Pulse: (!) 125 (!) 115 (!) 116   Resp: 18 13 (!) 21 13  Temp:      TempSrc:      SpO2: 95% 95% 99%       Constitutional: NAD, calm, comfortable HEENT:no JVD Respiratory: clear to auscultation bilaterally Cardiovascular: S1S2/RRR Abdomen: soft, non tender, Bowel sounds positive.  Musculoskeletal: No joint deformity upper and lower extremities. Ext: no edema Skin: no rashes Neurologic: CN 2-12 grossly intact. Sensation intact, DTR normal. Strength 5/5 in all 4.  Psychiatric: Normal judgment and insight. Alert and oriented x 3. Normal mood.   Labs on Admission: I have personally reviewed following labs and imaging studies  CBC: Recent Labs  Lab 07/30/23 1303 07/30/23 1341  WBC 7.7  --   NEUTROABS 5.4  --   HGB 15.2* 16.0*  HCT 47.3*  47.0*  MCV 91.0  --   PLT 233  --    Basic Metabolic Panel: Recent Labs  Lab 07/30/23 1303 07/30/23 1341  NA 139 140  K 3.7 3.8  CL 99 101  CO2 28  --   GLUCOSE 247* 250*  BUN 19 22  CREATININE 1.12* 1.10*  CALCIUM  8.6*  --    GFR: CrCl cannot be calculated (Unknown ideal weight.). Liver Function Tests: No results for input(s): "AST", "ALT", "ALKPHOS", "BILITOT", "PROT", "ALBUMIN " in the last 168 hours. No results for input(s): "LIPASE", "AMYLASE" in the last 168 hours. No results for input(s): "AMMONIA" in the last 168 hours. Coagulation Profile: No results for input(s): "INR", "PROTIME" in the last 168 hours. Cardiac Enzymes: No results for input(s): "CKTOTAL", "CKMB", "CKMBINDEX", "TROPONINI" in the last 168 hours. BNP (last 3 results) No results for input(s): "PROBNP" in the last 8760 hours. HbA1C: No results for input(s): "HGBA1C" in the last 72 hours. CBG: No results for input(s): "GLUCAP" in the last 168 hours. Lipid Profile: No results for input(s): "CHOL", "HDL", "LDLCALC", "TRIG", "CHOLHDL", "LDLDIRECT" in the last 72 hours. Thyroid Function Tests: No results for input(s): "TSH", "T4TOTAL", "FREET4", "T3FREE",  "THYROIDAB" in the last 72 hours. Anemia Panel: No results for input(s): "VITAMINB12", "FOLATE", "FERRITIN", "TIBC", "IRON", "RETICCTPCT" in the last 72 hours. Urine analysis:    Component Value Date/Time   COLORURINE YELLOW 07/03/2021 2032   APPEARANCEUR HAZY (A) 07/03/2021 2032   LABSPEC 1.015 07/03/2021 2032   PHURINE 8.0 07/03/2021 2032   GLUCOSEU NEGATIVE 07/03/2021 2032   HGBUR SMALL (A) 07/03/2021 2032   BILIRUBINUR NEGATIVE 07/03/2021 2032   KETONESUR NEGATIVE 07/03/2021 2032   PROTEINUR 100 (A) 07/03/2021 2032   UROBILINOGEN 0.2 03/20/2014 0237   NITRITE POSITIVE (A) 07/03/2021 2032   LEUKOCYTESUR LARGE (A) 07/03/2021 2032   Sepsis Labs: @LABRCNTIP (procalcitonin:4,lacticidven:4) )No results found for this or any previous visit (from the past 240 hours).   Radiological Exams on Admission: DG Chest Port 1 View Result Date: 07/30/2023 CLINICAL DATA:  Shortness of breath for the past 2-3 days.  Hypoxia. EXAM: PORTABLE CHEST 1 VIEW COMPARISON:  09/08/2022 FINDINGS: Improved inspiration. Heart size near the upper limit of normal. Prominent pulmonary vasculature and interstitial markings with mild progression. Probable small bilateral pleural effusions. Thoracic spine degenerative changes. IMPRESSION: Mild changes of acute congestive heart failure. Electronically Signed   By: Catherin Closs M.D.   On: 07/30/2023 13:49    EKG: Independently reviewed. Afib w RVR  Assessment/Plan Principal Problem:    Acute on chronic diastolic CHF Acute hypoxic respiratory failure -Admit to telemetry, IV Lasix  40 Mg twice daily -Check 2D echocardiogram -Slowly add GDMT, poor candidate for SGLT2i  Paroxysmal A-fib with RVR - In the setting of above, continue Cardizem  gtt., resumed Eliquis  - She is poorly compliant with Eliquis  at baseline - Check TSH with a.m. labs  Chronic tracheostomy -Routine trach care    Chronic pain syndrome -Home regimen of narcotics continued    Peripheral  vascular disease (HCC) Left BKA  COPD, former smoker -Continue home regimen of albuterol  as needed and ICS/LABA  Type 2 diabetes mellitus with hyperglycemia -Resume glargine and meal coverage NovoLog , check A1c  DVT prophylaxis: Eliquis  Code Status: DNR per patient wishes Family Communication: Son at bedside Disposition Plan: Home pending clinical improvement  Deforest Fast MD [facility]   07/30/2023, 6:12 PM

## 2023-07-30 NOTE — ED Provider Notes (Signed)
 [place] [facility] AT [facility] Provider Note   CSN: [id number] Arrival date & time: 07/30/23  1251     History  Chief Complaint  Patient presents with   Shortness of Breath    [name] is a 68 y.o. female.  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  She says she has had shortness of breath for the last 2 days.  She has been wheezy.  She says it feels similar to her prior COPD exacerbation.  She denies any chest pain.  No increase in leg swelling.  No known fevers.  She has a cough which is mostly dry although she is gotten a little bit of congestion out of her trach.  She was given a DuoNeb per EMS.  Her oxygen saturations were 90% on room air by EMS prior to oxygen administration.  She does not wear home oxygen.       Home Medications Prior to Admission medications   Medication Sig Start Date End Date Taking? Authorizing Provider  albuterol  (VENTOLIN  HFA) 108 (90 Base) MCG/ACT inhaler Inhale 2 puffs into the lungs every 4 (four) hours as needed for wheezing or shortness of breath. 09/10/22   Vada Garibaldi, MD  apixaban  (ELIQUIS ) 5 MG TABS tablet Take 1 tablet (5 mg total) by mouth 2 (two) times daily. 07/06/21 09/08/22  Modena Andes, MD  atorvastatin  (LIPITOR) 20 MG tablet Take 20 mg by mouth daily. 04/13/20   [provider]  Buprenorphine  HCl-Naloxone  HCl 8-2 MG FILM Place 1 Film under the tongue 3 (three) times daily.     [provider]  diltiazem  (CARDIZEM  CD) 240 MG 24 hr capsule Take 1 capsule (240 mg total) by mouth daily. 09/10/22 12/09/22  Vada Garibaldi, MD  Glycopyrrolate-Formoterol  (BEVESPI  AEROSPHERE) 9-4.8 MCG/ACT AERO Inhale 2 puffs into the lungs 2 (two) times daily. 04/28/23   Byrum, Robert S, MD  guaiFENesin   (ROBITUSSIN) 100 MG/5ML liquid Take 10 mLs by mouth every 6 (six) hours as needed for cough or to loosen phlegm. 09/10/22   Vada Garibaldi, MD  ibuprofen  (ADVIL ) 800 MG tablet Take 800 mg by mouth every 8 (eight) hours as needed for mild pain. 07/24/21   [provider]  insulin  aspart (NOVOLOG ) 100 UNIT/ML FlexPen Inject 10 Units into the skin 3 (three) times daily with meals. 07/12/20   Oral Billings, MD  insulin  glargine (LANTUS  SOLOSTAR) 100 UNIT/ML Solostar Pen Inject 45 Units into the skin daily. At 10AM 07/12/20   Oral Billings, MD  Insulin  Pen Needle (PEN NEEDLES 3/16") 31G X 5 MM MISC Use as directed. 03/24/14   Krishnan, Gokul, MD  pregabalin  (LYRICA ) 75 MG capsule Take 75 mg by mouth every 12 (twelve) hours. 07/10/21   [provider]      Allergies    Ace inhibitors and Zestril [lisinopril]    Review of Systems   Review of Systems  Constitutional:  Positive for fatigue. Negative for chills, diaphoresis and fever.  HENT:  Negative for congestion, rhinorrhea and sneezing.   Eyes: Negative.   Respiratory:  Positive for cough, shortness of breath and wheezing. Negative for chest tightness.   Cardiovascular:  Positive for leg swelling (At baseline). Negative for chest pain.  Gastrointestinal:  Negative for abdominal pain, blood in stool, diarrhea, nausea and vomiting.  Genitourinary:  Negative for difficulty urinating, flank pain, frequency and hematuria.  Musculoskeletal:  Negative for arthralgias and back pain.  Skin:  Negative for rash.  Neurological:  Negative for dizziness, speech difficulty, weakness, numbness and headaches.    Physical Exam Updated Vital Signs BP (!) 153/87   Pulse (!) 114   Temp 98.7 F (37.1 C) (Oral)   Resp 17   SpO2 (!) 85%  Physical Exam Constitutional:      Appearance: She is well-developed. She is obese. She is ill-appearing.  HENT:     Head: Normocephalic and atraumatic.  Eyes:     Pupils: Pupils are equal, round, and  reactive to light.  Cardiovascular:     Rate and Rhythm: Normal rate and regular rhythm.     Heart sounds: Normal heart sounds.  Pulmonary:     Effort: Pulmonary effort is normal. No respiratory distress.     Breath sounds: Wheezing present. No rales.     Comments: Diminished breath sounds to the lower lobes bilaterally Chest:     Chest wall: No tenderness.  Abdominal:     General: Bowel sounds are normal.     Palpations: Abdomen is soft.     Tenderness: There is no abdominal tenderness. There is no guarding or rebound.  Musculoskeletal:        General: Normal range of motion.     Cervical back: Normal range of motion and neck supple.     Comments: Left leg amputation, right leg has pitting edema  Lymphadenopathy:     Cervical: No cervical adenopathy.  Skin:    General: Skin is warm and dry.     Findings: No rash.  Neurological:     Mental Status: She is alert and oriented to person, place, and time.     ED Results / Procedures / Treatments   Labs (all labs ordered are listed, but only abnormal results are displayed) Labs Reviewed  BASIC METABOLIC PANEL WITH GFR - Abnormal; Notable for the following components:      Result Value   Glucose, Bld 247 (*)    Creatinine, Ser 1.12 (*)    Calcium  8.6 (*)    GFR, Estimated 55 (*)    All other components within normal limits  CBC WITH DIFFERENTIAL/PLATELET - Abnormal; Notable for the following components:   RBC 5.20 (*)    Hemoglobin 15.2 (*)    HCT 47.3 (*)    All other components within normal limits  BRAIN NATRIURETIC PEPTIDE - Abnormal; Notable for the following components:   B Natriuretic Peptide 151.3 (*)    All other components within normal limits  I-STAT CHEM 8, ED - Abnormal; Notable for the following components:   Creatinine, Ser 1.10 (*)    Glucose, Bld 250 (*)    Calcium , Ion 1.09 (*)    Hemoglobin 16.0 (*)    HCT 47.0 (*)    All other components within normal limits  TROPONIN I (HIGH SENSITIVITY)  TROPONIN  I (HIGH SENSITIVITY)    EKG EKG Interpretation Date/Time:  Wednesday July 30 2023 13:14:54 EDT Ventricular Rate:  124 PR Interval:    QRS Duration:  100 QT Interval:  344 QTC Calculation: 482 R Axis:   117  Text Interpretation: Atrial fibrillation Right axis deviation Low voltage, extremity and precordial leads Confirmed by Hershel Los [phone]) on 07/30/2023 2:01:51 PM  Radiology DG Chest Port 1 View Result Date: 07/30/2023 CLINICAL DATA:  Shortness of breath for the past 2-3 days.  Hypoxia. EXAM: PORTABLE  CHEST 1 VIEW COMPARISON:  09/08/2022 FINDINGS: Improved inspiration. Heart size near the upper limit of normal. Prominent pulmonary vasculature and interstitial markings with mild progression. Probable small bilateral pleural effusions. Thoracic spine degenerative changes. IMPRESSION: Mild changes of acute congestive heart failure. Electronically Signed   By: Catherin Closs M.D.   On: 07/30/2023 13:49    Procedures Procedures    Medications Ordered in ED Medications  diltiazem  (CARDIZEM ) 1 mg/mL load via infusion 10 mg (has no administration in time range)    And  diltiazem  (CARDIZEM ) 125 mg in dextrose  5% 125 mL (1 mg/mL) infusion (has no administration in time range)  furosemide  (LASIX ) injection 40 mg (has no administration in time range)  methylPREDNISolone  sodium succinate (SOLU-MEDROL ) 125 mg/2 mL injection 125 mg (125 mg Intravenous Given 07/30/23 1409)  albuterol  (PROVENTIL ) (2.5 MG/3ML) 0.083% nebulizer solution 5 mg (5 mg Nebulization Given 07/30/23 1407)    ED Course/ Medical Decision Making/ A&P Clinical Course as of 07/30/23 1555  Wed Jul 30, 2023  1554 Received sign out from Dr. Nolia Baumgartner, presenting with shortness of breath. Has pulmonary edema and mild BNP elevation. Also in afib with RVR on diltiazem  drip. On 4 L trach collar which is new.  [WS]    Clinical Course User Index [WS] Mordecai Applebaum, MD                                 Medical Decision  Making Amount and/or Complexity of Data Reviewed Labs: ordered. Radiology: ordered.  Risk Prescription drug management.   This patient presents to the ED for concern of shortness of breath, this involves an extensive number of treatment options, and is a complaint that carries with it a high risk of complications and morbidity.  I considered the following differential and admission for this acute, potentially life threatening condition.  The differential diagnosis includes pneumonia, pulmonary edema, CHF exacerbation, ACS, pulmonary embolus, COPD.  MDM:    Patient is a 68 year old who presents with shortness of breath.  She does have a history of COPD as well as CHF.  She is noted to be in atrial fibrillation with RVR.  She does report a history of atrial fibrillation and is on diltiazem .  She is on Eliquis  but says she does not take it consistently.  She was initially not hypoxic and was treated with nebulizer treatments.  She then did drop her sats into the mid 80s.  She was diffusely wheezy on exam.  She has improved with treatments here.  She was given Solu-Medrol .  Chest x-ray does show evidence of some pulmonary edema.  This was interpreted by me and confirmed by the radiologist.  Her BNP is slightly elevated.  Was started on Cardizem  infusion for her A-fib with RVR.  There is no evidence of pneumonia on x-ray.  Will give her some Lasix  to help with diuresis.  Suspect she has combination of COPD exacerbation with some CHF.  Will plan admission to the medicine service.  (Labs, imaging, consults)  Labs: I Ordered, and personally interpreted labs.  The pertinent results include: Normal troponin, elevated BNP, mild elevation in creatinine  Imaging Studies ordered: I ordered imaging studies including chest x-ray I independently visualized and interpreted imaging. I agree with the radiologist interpretation  Additional history obtained from chart.  External records from outside source  obtained and reviewed including prior notes  Cardiac Monitoring: The patient was maintained on a cardiac  monitor.  If on the cardiac monitor, I personally viewed and interpreted the cardiac monitored which showed an underlying rhythm of: Atrial fibrillation with RVR  Reevaluation: After the interventions noted above, I reevaluated the patient and found that they have :improved  Social Determinants of Health:  none  Disposition: Admitted to hospital  Co morbidities that complicate the patient evaluation  Past Medical History:  Diagnosis Date   Anxiety    Arthritis    COPD (chronic obstructive pulmonary disease) (HCC)    Depression    Diabetes mellitus without complication (HCC)    INSULIN  DEPENDENT   Diabetic neuropathy (HCC)    Difficult intubation    total laryngectomy   Fibromyalgia    Head and neck cancer (HCC) 04/19/2015   Hypertension    Kidney stone    Morbid obesity with BMI of 40.0-44.9, adult (HCC)    Neuropathy    Nocturia    Pneumonia    Rash, skin    face   Renal insufficiency    Shortness of breath    Sleep apnea    has not used cpap past 10 yrs   Urge incontinence      Medicines Meds ordered this encounter  Medications   methylPREDNISolone  sodium succinate (SOLU-MEDROL ) 125 mg/2 mL injection 125 mg   albuterol  (PROVENTIL ) (2.5 MG/3ML) 0.083% nebulizer solution 5 mg   AND Linked Order Group    diltiazem  (CARDIZEM ) 1 mg/mL load via infusion 10 mg    diltiazem  (CARDIZEM ) 125 mg in dextrose  5% 125 mL (1 mg/mL) infusion   furosemide  (LASIX ) injection 40 mg    I have reviewed the patients home medicines and have made adjustments as needed  Problem List / ED Course: Problem List Items Addressed This Visit   None Visit Diagnoses       COPD exacerbation (HCC)    -  Primary   Relevant Medications   methylPREDNISolone  sodium succinate (SOLU-MEDROL ) 125 mg/2 mL injection 125 mg (Completed)   albuterol  (PROVENTIL ) (2.5 MG/3ML) 0.083% nebulizer solution 5  mg (Completed)     Atrial fibrillation with RVR (HCC)       Relevant Medications   diltiazem  (CARDIZEM ) 1 mg/mL load via infusion 10 mg (Start on 07/30/2023  3:30 PM)   diltiazem  (CARDIZEM ) 125 mg in dextrose  5% 125 mL (1 mg/mL) infusion (Start on 07/30/2023  3:30 PM)   furosemide  (LASIX ) injection 40 mg (Start on 07/30/2023  3:30 PM)             Final Clinical Impression(s) / ED Diagnoses Final diagnoses:  COPD exacerbation (HCC)  Atrial fibrillation with RVR (HCC)    Rx / DC Orders ED Discharge Orders     None         Hershel Los, MD 07/30/23 1555

## 2023-07-30 NOTE — ED Notes (Signed)
Pt placed on o2

## 2023-07-30 NOTE — ED Triage Notes (Signed)
 Increasing SHOB for the past 2 or 3 days. Low 90's on room air. PMH of throat cancer, no trach.

## 2023-07-31 ENCOUNTER — Other Ambulatory Visit (HOSPITAL_COMMUNITY)

## 2023-07-31 ENCOUNTER — Telehealth: Payer: Self-pay | Admitting: *Deleted

## 2023-07-31 DIAGNOSIS — I5033 Acute on chronic diastolic (congestive) heart failure: Secondary | ICD-10-CM | POA: Diagnosis not present

## 2023-07-31 LAB — CBC
HCT: 51.5 % — ABNORMAL HIGH (ref 36.0–46.0)
Hemoglobin: 16.3 g/dL — ABNORMAL HIGH (ref 12.0–15.0)
MCH: 29 pg (ref 26.0–34.0)
MCHC: 31.7 g/dL (ref 30.0–36.0)
MCV: 91.5 fL (ref 80.0–100.0)
Platelets: 247 10*3/uL (ref 150–400)
RBC: 5.63 MIL/uL — ABNORMAL HIGH (ref 3.87–5.11)
RDW: 12.8 % (ref 11.5–15.5)
WBC: 7.5 10*3/uL (ref 4.0–10.5)
nRBC: 0 % (ref 0.0–0.2)

## 2023-07-31 LAB — COMPREHENSIVE METABOLIC PANEL WITH GFR
ALT: 19 U/L (ref 0–44)
AST: 14 U/L — ABNORMAL LOW (ref 15–41)
Albumin: 3.7 g/dL (ref 3.5–5.0)
Alkaline Phosphatase: 90 U/L (ref 38–126)
Anion gap: 9 (ref 5–15)
BUN: 23 mg/dL (ref 8–23)
CO2: 32 mmol/L (ref 22–32)
Calcium: 8.6 mg/dL — ABNORMAL LOW (ref 8.9–10.3)
Chloride: 95 mmol/L — ABNORMAL LOW (ref 98–111)
Creatinine, Ser: 1.22 mg/dL — ABNORMAL HIGH (ref 0.44–1.00)
GFR, Estimated: 49 mL/min — ABNORMAL LOW (ref >60)
Glucose, Bld: 319 mg/dL — ABNORMAL HIGH (ref 70–99)
Potassium: 4.4 mmol/L (ref 3.5–5.1)
Sodium: 136 mmol/L (ref 135–145)
Total Bilirubin: 0.6 mg/dL (ref 0.0–1.2)
Total Protein: 7.2 g/dL (ref 6.5–8.1)

## 2023-07-31 LAB — HEMOGLOBIN A1C
Hgb A1c MFr Bld: 7.9 % — ABNORMAL HIGH (ref 4.8–5.6)
Mean Plasma Glucose: 180.03 mg/dL

## 2023-07-31 LAB — GLUCOSE, CAPILLARY: Glucose-Capillary: 170 mg/dL — ABNORMAL HIGH (ref 70–99)

## 2023-07-31 LAB — MAGNESIUM: Magnesium: 2.1 mg/dL (ref 1.7–2.4)

## 2023-07-31 LAB — TSH: TSH: 1.674 u[IU]/mL (ref 0.350–4.500)

## 2023-07-31 LAB — MRSA NEXT GEN BY PCR, NASAL: MRSA by PCR Next Gen: NOT DETECTED

## 2023-07-31 LAB — CBG MONITORING, ED: Glucose-Capillary: 252 mg/dL — ABNORMAL HIGH (ref 70–99)

## 2023-07-31 MED ORDER — PREGABALIN 75 MG PO CAPS
75.0000 mg | ORAL_CAPSULE | Freq: Two times a day (BID) | ORAL | Status: DC
  Administered 2023-07-31 – 2023-08-02 (×3): 75 mg via ORAL
  Filled 2023-07-31 (×4): qty 1
  Filled 2023-07-31: qty 3
  Filled 2023-07-31 (×2): qty 1

## 2023-07-31 MED ORDER — DILTIAZEM HCL 60 MG PO TABS
60.0000 mg | ORAL_TABLET | Freq: Three times a day (TID) | ORAL | Status: DC
  Administered 2023-07-31 – 2023-08-01 (×3): 60 mg via ORAL
  Filled 2023-07-31 (×4): qty 1

## 2023-07-31 MED ORDER — ALBUTEROL SULFATE (2.5 MG/3ML) 0.083% IN NEBU
2.5000 mg | INHALATION_SOLUTION | RESPIRATORY_TRACT | Status: DC | PRN
  Administered 2023-08-02: 2.5 mg via RESPIRATORY_TRACT
  Filled 2023-07-31: qty 3

## 2023-07-31 MED ORDER — INSULIN ASPART 100 UNIT/ML IJ SOLN
5.0000 [IU] | Freq: Three times a day (TID) | INTRAMUSCULAR | Status: DC
  Administered 2023-07-31 – 2023-08-02 (×7): 5 [IU] via SUBCUTANEOUS

## 2023-07-31 MED ORDER — SPIRONOLACTONE 12.5 MG HALF TABLET
12.5000 mg | ORAL_TABLET | Freq: Every day | ORAL | Status: DC
  Administered 2023-07-31 – 2023-08-04 (×5): 12.5 mg via ORAL
  Filled 2023-07-31 (×5): qty 1

## 2023-07-31 MED ORDER — BUPRENORPHINE HCL-NALOXONE HCL 8-2 MG SL SUBL
1.0000 | SUBLINGUAL_TABLET | Freq: Three times a day (TID) | SUBLINGUAL | Status: DC
  Administered 2023-08-01 – 2023-08-04 (×11): 1 via SUBLINGUAL
  Filled 2023-07-31 (×11): qty 1

## 2023-07-31 MED ORDER — UMECLIDINIUM BROMIDE 62.5 MCG/ACT IN AEPB
1.0000 | INHALATION_SPRAY | Freq: Every day | RESPIRATORY_TRACT | Status: DC
  Administered 2023-07-31: 1 via RESPIRATORY_TRACT
  Filled 2023-07-31: qty 7

## 2023-07-31 MED ORDER — APIXABAN 5 MG PO TABS
5.0000 mg | ORAL_TABLET | Freq: Two times a day (BID) | ORAL | Status: DC
  Administered 2023-07-31 – 2023-08-04 (×9): 5 mg via ORAL
  Filled 2023-07-31 (×9): qty 1

## 2023-07-31 MED ORDER — ATORVASTATIN CALCIUM 10 MG PO TABS
20.0000 mg | ORAL_TABLET | Freq: Every day | ORAL | Status: DC
  Administered 2023-07-31 – 2023-08-01 (×2): 20 mg via ORAL
  Filled 2023-07-31 (×2): qty 2

## 2023-07-31 MED ORDER — INSULIN ASPART 100 UNIT/ML FLEXPEN: 10.0000 [IU] | PEN_INJECTOR | Freq: Three times a day (TID) | SUBCUTANEOUS | Status: DC

## 2023-07-31 MED ORDER — INSULIN ASPART 100 UNIT/ML IJ SOLN
10.0000 [IU] | Freq: Three times a day (TID) | INTRAMUSCULAR | Status: DC
  Administered 2023-07-31 (×2): 10 [IU] via SUBCUTANEOUS

## 2023-07-31 MED ORDER — ARFORMOTEROL TARTRATE 15 MCG/2ML IN NEBU
15.0000 ug | INHALATION_SOLUTION | Freq: Two times a day (BID) | RESPIRATORY_TRACT | Status: DC
  Administered 2023-07-31 – 2023-08-04 (×9): 15 ug via RESPIRATORY_TRACT
  Filled 2023-07-31 (×9): qty 2

## 2023-07-31 MED ORDER — BUPRENORPHINE HCL-NALOXONE HCL 8-2 MG SL SUBL
1.0000 | SUBLINGUAL_TABLET | Freq: Every day | SUBLINGUAL | Status: DC
  Administered 2023-07-31: 1 via SUBLINGUAL
  Filled 2023-07-31: qty 1

## 2023-07-31 MED ORDER — INSULIN GLARGINE-YFGN 100 UNIT/ML ~~LOC~~ SOLN
40.0000 [IU] | Freq: Every day | SUBCUTANEOUS | Status: DC
  Administered 2023-07-31 – 2023-08-02 (×3): 40 [IU] via SUBCUTANEOUS
  Filled 2023-07-31 (×4): qty 0.4

## 2023-07-31 MED ORDER — ALBUTEROL SULFATE HFA 108 (90 BASE) MCG/ACT IN AERS: 2.0000 | INHALATION_SPRAY | RESPIRATORY_TRACT | Status: DC | PRN

## 2023-07-31 NOTE — Inpatient Diabetes Management (Signed)
 Inpatient Diabetes Program Recommendations  AACE/ADA: New Consensus Statement on Inpatient Glycemic Control (2015)  Target Ranges:  Prepandial:   less than 140 mg/dL      Peak postprandial:   less than 180 mg/dL (1-2 hours)      Critically ill patients:  140 - 180 mg/dL   Lab Results  Component Value Date   GLUCAP 231 (H) 09/10/2022   HGBA1C 7.9 (H) 07/31/2023    Review of Glycemic Control  Latest Reference Range & Units 07/30/23 13:03 07/30/23 13:41 07/31/23 07:33  Glucose 70 - 99 mg/dL 161 (H) 096 (H) 045 (H)  (H): Data is abnormally high  Diabetes history: DM2 Outpatient Diabetes medications: Lantus  45 units every day, Novolog  10 units TID Current orders for Inpatient glycemic control: Semglee  40 units every day, Novolog  10 units TID  Inpatient Diabetes Program Recommendations:    Novolog  0-9 units TID and 0-5 units at bedtime Novolog  5 units TID with meals  Thank you, Hays Lipschutz, MSN, CDCES Diabetes Coordinator Inpatient Diabetes Program [phone] (team pager from 8a-5p)

## 2023-07-31 NOTE — Progress Notes (Signed)
 Informed by RN that Suboxone  8-2 mg sublingual once daily was ordered on admission but patient states she takes Suboxone  3 times a day at home and is requesting for the dose to be changed.  I have spoken to pharmacist who reviewed outpatient dispense records and confirmed that patient takes Suboxone  3 times daily, as such, dose has been changed.

## 2023-07-31 NOTE — ED Notes (Signed)
 Called CCMD to add to cardiac monitoring

## 2023-07-31 NOTE — Telephone Encounter (Signed)
 I have denied rx for albuterol  inhaler at this time- note to pharmacy that pt needs appt with us   She has not been physically seen by provider here since 2022 Last visit was virtual and no mention of albuterol   Pt currently admitted to the hospital with AFIB

## 2023-07-31 NOTE — Progress Notes (Signed)
 Orthopedic Tech Progress Note Patient Details:  [name] 04/21/1955 [id number]  Ortho Devices Type of Ortho Device: Radio broadcast assistant Ortho Device/Splint Location: RLE Ortho Device/Splint Interventions: Ordered, Application, Adjustment   Post Interventions Patient Tolerated: Well Instructions Provided: Care of device, Adjustment of device  [name] [name] 07/31/2023, 8:11 PM

## 2023-07-31 NOTE — Progress Notes (Signed)
 PROGRESS NOTE    [name]  BJY:[id number] DOB: July 26, 1955 DOA: 07/30/2023 PCP: Dillon Frames, FNP  [name] is a 67/F with history of head and neck CA status post total laryngectomy, tracheostomy, former smoker, obese, type II diabetic, peripheral vascular disease with left BKA, paroxysmal A-fib poorly compliant with Eliquis , OSA, history of COPD, chronic pain syndrome, narcotic dependence presented to the ED with progressive shortness of breath, swelling.  She reports chronic orthopnea at baseline.  Mild chronic cough is unchanged, intermittent wheezing reported as well, denies fevers chills or increased secretions at this time.  Previously was on Lasix  but has not taken this for years ED Course: Noted to be in A-fib with RVR, volume overloaded, mildly hypoxic placed on 2 L O2 via trach collar, labs noted creatinine 1.1, glucose 247, BNP 151, troponin 16, hemoglobin 15. - Chest x-ray with pulmonary vascular congestion and CHF changes, EKG noted A-fib RVR, given IV Lasix  and started on Cardizem  gtt.   Subjective: -Feels much better, breathing is improving  Assessment and Plan:   Acute on chronic diastolic CHF Acute hypoxic respiratory failure - Improving with diuresis, continue IV Lasix  40 Mg twice daily, urine output not recorded in the ED - Follow-up 2D echocardiogram - Add aldactone, poor candidate for SGLT2i   Paroxysmal A-fib with RVR - In the setting of above, heart rate improved, transition of Cardizem  gtt., Eliquis  resumed -Add oral Cardizem  today - She is poorly compliant with Eliquis  at baseline - Check TSH with a.m. labs   Chronic tracheostomy -Routine trach care     Chronic pain syndrome -Home regimen of narcotics continued     Peripheral vascular disease (HCC) Left BKA   COPD, former smoker -Continue home regimen of albuterol  as needed and ICS/LABA   Type 2 diabetes mellitus with hyperglycemia -Resume glargine and meal coverage, A1c is 7.9   DVT  prophylaxis: Eliquis  Code Status: DNR per patient wishes Family Communication: Son at bedside Disposition Plan: Home pending above workup  Objective: Vitals:   07/31/23 0700 07/31/23 0746 07/31/23 1000 07/31/23 1149  BP: 137/83 137/83 128/76   Pulse: 85 83 84   Resp:  18 19   Temp:  97.9 F (36.6 C)  98.1 F (36.7 C)  TempSrc:  Oral  Oral  SpO2: 94% 94% 95%     Intake/Output Summary (Last 24 hours) at 07/31/2023 1315 Last data filed at 07/31/2023 0817 Gross per 24 hour  Intake --  Output 800 ml  Net -800 ml   There were no vitals filed for this visit.  Examination:  General exam: Obese pleasant female sitting up in bed H he T: Tracheostomy with trach collar Respiratory system: Few scattered rhonchi otherwise clear Cardiovascular system: S1 & S2 heard, RRR.  Abd: nondistended, soft and nontender.Normal bowel sounds heard. Central nervous system: Alert and oriented. No focal neurological deficits. Extremities: Trace edema, left BKA Skin: No rashes Psychiatry:  Mood & affect appropriate.     Data Reviewed:   CBC: Recent Labs  Lab 07/30/23 1303 07/30/23 1341 07/31/23 0733  WBC 7.7  --  7.5  NEUTROABS 5.4  --   --   HGB 15.2* 16.0* 16.3*  HCT 47.3* 47.0* 51.5*  MCV 91.0  --  91.5  PLT 233  --  247   Basic Metabolic Panel: Recent Labs  Lab 07/30/23 1303 07/30/23 1341 07/31/23 0733  NA 139 140 136  K 3.7 3.8 4.4  CL 99 101 95*  CO2 28  --  32  GLUCOSE 247* 250* 319*  BUN 19 22 23   CREATININE 1.12* 1.10* 1.22*  CALCIUM  8.6*  --  8.6*  MG  --   --  2.1   GFR: CrCl cannot be calculated (Unknown ideal weight.). Liver Function Tests: Recent Labs  Lab 07/31/23 0733  AST 14*  ALT 19  ALKPHOS 90  BILITOT 0.6  PROT 7.2  ALBUMIN  3.7   No results for input(s): "LIPASE", "AMYLASE" in the last 168 hours. No results for input(s): "AMMONIA" in the last 168 hours. Coagulation Profile: No results for input(s): "INR", "PROTIME" in the last 168  hours. Cardiac Enzymes: No results for input(s): "CKTOTAL", "CKMB", "CKMBINDEX", "TROPONINI" in the last 168 hours. BNP (last 3 results) No results for input(s): "PROBNP" in the last 8760 hours. HbA1C: Recent Labs    07/31/23 0824  HGBA1C 7.9*   CBG: Recent Labs  Lab 07/31/23 1205  GLUCAP 252*   Lipid Profile: No results for input(s): "CHOL", "HDL", "LDLCALC", "TRIG", "CHOLHDL", "LDLDIRECT" in the last 72 hours. Thyroid Function Tests: Recent Labs    07/31/23 0734  TSH 1.674   Anemia Panel: No results for input(s): "VITAMINB12", "FOLATE", "FERRITIN", "TIBC", "IRON", "RETICCTPCT" in the last 72 hours. Urine analysis:    Component Value Date/Time   COLORURINE YELLOW 07/03/2021 2032   APPEARANCEUR HAZY (A) 07/03/2021 2032   LABSPEC 1.015 07/03/2021 2032   PHURINE 8.0 07/03/2021 2032   GLUCOSEU NEGATIVE 07/03/2021 2032   HGBUR SMALL (A) 07/03/2021 2032   BILIRUBINUR NEGATIVE 07/03/2021 2032   KETONESUR NEGATIVE 07/03/2021 2032   PROTEINUR 100 (A) 07/03/2021 2032   UROBILINOGEN 0.2 03/20/2014 0237   NITRITE POSITIVE (A) 07/03/2021 2032   LEUKOCYTESUR LARGE (A) 07/03/2021 2032   Sepsis Labs: @LABRCNTIP (procalcitonin:4,lacticidven:4)  )No results found for this or any previous visit (from the past 240 hours).   Radiology Studies: DG Chest Port 1 View Result Date: 07/30/2023 CLINICAL DATA:  Shortness of breath for the past 2-3 days.  Hypoxia. EXAM: PORTABLE CHEST 1 VIEW COMPARISON:  09/08/2022 FINDINGS: Improved inspiration. Heart size near the upper limit of normal. Prominent pulmonary vasculature and interstitial markings with mild progression. Probable small bilateral pleural effusions. Thoracic spine degenerative changes. IMPRESSION: Mild changes of acute congestive heart failure. Electronically Signed   By: Catherin Closs M.D.   On: 07/30/2023 13:49     Scheduled Meds:  apixaban   5 mg Oral BID   arformoterol   15 mcg Nebulization BID   And   umeclidinium bromide   1  puff Inhalation Daily   atorvastatin   20 mg Oral Daily   buprenorphine -naloxone   1 tablet Sublingual Daily   furosemide   40 mg Intravenous BID   insulin  aspart  5 Units Subcutaneous TID WC   insulin  glargine-yfgn  40 Units Subcutaneous Daily   pregabalin   75 mg Oral Q12H   Continuous Infusions:  diltiazem  (CARDIZEM ) infusion 15 mg/hr (07/31/23 0722)     LOS: 1 day    Time spent:    Deforest Fast, MD [facility]   07/31/2023, 1:15 PM

## 2023-07-31 NOTE — ED Notes (Signed)
 Breakfast tray delivered

## 2023-07-31 NOTE — ED Notes (Signed)
Called dietary for a breakfast tray

## 2023-07-31 NOTE — Telephone Encounter (Signed)
 Copied from CRM [id number]. Topic: Clinical - Medication Refill >> Jul 29, 2023 12:22 PM Tianna S wrote: Medication: albuterol  (VENTOLIN  HFA) 108 (90 Base) MCG/ACT inhaler  Has the patient contacted their pharmacy? Yes (Agent: If no, request that the patient contact the pharmacy for the refill. If patient does not wish to contact the pharmacy document the reason why and proceed with request.) (Agent: If yes, when and what did the pharmacy advise?)  This is the patient's preferred pharmacy:  [phone] [name], [place] - [address] [place] [address] Phone: [phone] Fax: [phone]   Is this the correct pharmacy for this prescription? Yes If no, delete pharmacy and type the correct one.   Has the prescription been filled recently? No  Is the patient out of the medication? Yes  Has the patient been seen for an appointment in the last year OR does the patient have an upcoming appointment? Yes  Can we respond through MyChart? Yes  Agent: Please be advised that Rx refills may take up to 3 business days. We ask that you follow-up with your pharmacy.  Patient doesn't see the original provider who prescribed this and would like dr byrum to fill since she seen him 01/2023. >> Jul 31, 2023 10:29 AM Crist Dominion wrote: Patient is requesting an update on this as she needs this medication as soon as possible.

## 2023-08-01 ENCOUNTER — Inpatient Hospital Stay (HOSPITAL_COMMUNITY)

## 2023-08-01 DIAGNOSIS — I5033 Acute on chronic diastolic (congestive) heart failure: Secondary | ICD-10-CM

## 2023-08-01 DIAGNOSIS — I480 Paroxysmal atrial fibrillation: Secondary | ICD-10-CM | POA: Diagnosis not present

## 2023-08-01 LAB — BASIC METABOLIC PANEL WITH GFR
Anion gap: 11 (ref 5–15)
BUN: 28 mg/dL — ABNORMAL HIGH (ref 8–23)
CO2: 32 mmol/L (ref 22–32)
Calcium: 8.3 mg/dL — ABNORMAL LOW (ref 8.9–10.3)
Chloride: 95 mmol/L — ABNORMAL LOW (ref 98–111)
Creatinine, Ser: 1.41 mg/dL — ABNORMAL HIGH (ref 0.44–1.00)
GFR, Estimated: 41 mL/min — ABNORMAL LOW (ref >60)
Glucose, Bld: 243 mg/dL — ABNORMAL HIGH (ref 70–99)
Potassium: 3.4 mmol/L — ABNORMAL LOW (ref 3.5–5.1)
Sodium: 138 mmol/L (ref 135–145)

## 2023-08-01 LAB — GLUCOSE, CAPILLARY
Glucose-Capillary: 158 mg/dL — ABNORMAL HIGH (ref 70–99)
Glucose-Capillary: 161 mg/dL — ABNORMAL HIGH (ref 70–99)
Glucose-Capillary: 130 mg/dL — ABNORMAL HIGH (ref 70–99)

## 2023-08-01 LAB — ECHOCARDIOGRAM COMPLETE
Area-P 1/2: 2.84 cm2
S' Lateral: 3.4 cm

## 2023-08-01 MED ORDER — METOPROLOL TARTRATE 25 MG PO TABS
25.0000 mg | ORAL_TABLET | Freq: Two times a day (BID) | ORAL | Status: DC
  Administered 2023-08-01 – 2023-08-04 (×6): 25 mg via ORAL
  Filled 2023-08-01 (×6): qty 1

## 2023-08-01 MED ORDER — FUROSEMIDE 10 MG/ML IJ SOLN: 40.0000 mg | Freq: Every day | INTRAMUSCULAR | Status: DC

## 2023-08-01 MED ORDER — LABETALOL HCL 5 MG/ML IV SOLN
20.0000 mg | INTRAVENOUS | Status: DC | PRN
  Administered 2023-08-02: 20 mg via INTRAVENOUS
  Filled 2023-08-01 (×2): qty 4

## 2023-08-01 MED ORDER — POTASSIUM CHLORIDE CRYS ER 20 MEQ PO TBCR
40.0000 meq | EXTENDED_RELEASE_TABLET | Freq: Two times a day (BID) | ORAL | Status: AC
  Administered 2023-08-01 (×2): 40 meq via ORAL
  Filled 2023-08-01 (×2): qty 2

## 2023-08-01 MED ORDER — ATORVASTATIN CALCIUM 40 MG PO TABS
40.0000 mg | ORAL_TABLET | Freq: Every day | ORAL | Status: DC
  Administered 2023-08-02 – 2023-08-04 (×3): 40 mg via ORAL
  Filled 2023-08-01 (×3): qty 1

## 2023-08-01 MED ORDER — DILTIAZEM HCL 60 MG PO TABS
90.0000 mg | ORAL_TABLET | Freq: Three times a day (TID) | ORAL | Status: DC
  Administered 2023-08-01 – 2023-08-04 (×9): 90 mg via ORAL
  Filled 2023-08-01 (×9): qty 1

## 2023-08-01 MED ORDER — ONDANSETRON HCL 4 MG/2ML IJ SOLN
4.0000 mg | Freq: Four times a day (QID) | INTRAMUSCULAR | Status: DC | PRN
  Administered 2023-08-01 – 2023-08-03 (×3): 4 mg via INTRAVENOUS
  Filled 2023-08-01 (×3): qty 2

## 2023-08-01 MED ORDER — PERFLUTREN LIPID MICROSPHERE
1.0000 mL | INTRAVENOUS | Status: AC | PRN
  Administered 2023-08-01: 3 mL via INTRAVENOUS

## 2023-08-01 NOTE — TOC Initial Note (Signed)
 Transition of Care [facility]) - Initial/Assessment Note    Patient Details  Name: [name] MRN: [id number] Date of Birth: 01/16/1956  Transition of Care [facility]) CM/SW Contact:    Juliane Och, LCSW Phone Number: 08/01/2023, 12:12 PM  Clinical Narrative:                  12:12 PM CSW introduced self and role to patient and patient's son, [name]. Patient and son informed CSW that they receive food stamps and utilize [employer], community for transportation. Patient and [name] consented CSW to provide additional food and transportation resources. Patient and [name] declined CSW offer of social connections resources. CSW provided food and transportation resources. Per chart review, patient resides at home with child(ren). Patient has a PCP and insurance. Patient has CIR history but not SNF history. Patient has HH history with [facility]. Patient has DME history (BSC, hospital bed, manual wheelchair) with [employer].   Expected Discharge Plan: Home/Self Care Barriers to Discharge: Continued Medical Work up   Patient Goals and CMS Choice            Expected Discharge Plan and Services       Living arrangements for the past 2 months: Single Family Home                                      Prior Living Arrangements/Services Living arrangements for the past 2 months: Single Family Home Lives with:: Adult Children Patient language and need for interpreter reviewed:: Yes              Criminal Activity/Legal Involvement Pertinent to Current Situation/Hospitalization: No - Comment as needed  Activities of Daily Living   ADL Screening (condition at time of admission) Independently performs ADLs?: No Does the patient have a NEW difficulty with bathing/dressing/toileting/self-feeding that is expected to last >3 days?: No Does the patient have a NEW difficulty with getting in/out of bed, walking, or climbing stairs that is expected to last >3 days?: No Does the patient have a NEW  difficulty with communication that is expected to last >3 days?: No Is the patient deaf or have difficulty hearing?: No Does the patient have difficulty seeing, even when wearing glasses/contacts?: No Does the patient have difficulty concentrating, remembering, or making decisions?: No  Permission Sought/Granted Permission sought to share information with : Family Supports Permission granted to share information with : Yes, Verbal Permission Granted  Share Information with NAME: [name]     Permission granted to share info w Relationship: Son  Permission granted to share info w Contact Information: [phone]  Emotional Assessment Appearance:: Appears stated age Attitude/Demeanor/Rapport: Engaged Affect (typically observed): Accepting, Adaptable, Appropriate, Calm, Stable Orientation: : Oriented to Self, Oriented to Place, Oriented to Situation, Oriented to  Time Alcohol / Substance Use: Not Applicable Psych Involvement: No (comment)  Admission diagnosis:  CHF (congestive heart failure) (HCC) [I50.9] COPD exacerbation (HCC) [J44.1] Atrial fibrillation with RVR (HCC) [I48.91] Patient Active Problem List   Diagnosis Date Noted   Acute on chronic diastolic CHF (congestive heart failure) (HCC) 07/30/2023   CHF (congestive heart failure) (HCC) 07/30/2023   Acute hypoxic respiratory failure (HCC) 09/08/2022   Obstructive pyelonephritis 07/04/2021   Acute on chronic respiratory failure with hypoxia and hypercapnia (HCC) 07/04/2021   Ascending aorta dilatation (HCC) 07/04/2021   PAF (paroxysmal atrial fibrillation) (HCC) 07/11/2020   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  Acute hypoxemic respiratory failure (HCC) 07/08/2020   Type 2 diabetes mellitus (HCC) 07/08/2020   Hypertensive urgency 07/08/2020   Hyperlipidemia 07/08/2020   Pressure injury of skin 07/08/2020   COPD (chronic obstructive pulmonary disease) (HCC) 07/07/2020   Fever and chills    Hypoxia    Abnormal CT  of the chest 11/28/2017   Non-small cell cancer of left lung (HCC) 10/17/2017   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   Post-operative pain    Constipation due to pain medication    Neuropathic pain    Muscle spasm    SIRS (systemic inflammatory response syndrome) (HCC) 09/18/2015   Labile blood pressure    DM type 2 with diabetic peripheral neuropathy (HCC)    Abnormality of gait    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   Status post below-knee amputation of left lower extremity (HCC)    Acute bronchitis    Primary osteoarthritis of right knee    Infection    Sepsis (HCC) 09/09/2015   Wound infection 09/09/2015   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) 09/09/2015   Type 2 diabetes mellitus with circulatory disorder (HCC)    Acute osteomyelitis of left foot (HCC) 08/25/2015   Diabetic foot infection (HCC) 04/19/2015   Diabetic foot ulcer (HCC) 04/19/2015   Stage 3b chronic kidney disease (CKD) (HCC) 04/19/2015   Head and neck cancer (HCC) 04/19/2015   Right hip pain 04/19/2015   Infection of supraglottis (throat) 09/16/2014   Incidental lung nodule, less than or equal to 3mm 09/16/2014   Obstructive uropathy 03/22/2014   Controlled type 2 diabetes mellitus with diabetic nephropathy (HCC) 03/20/2014   Lice infested hair 08/03/2013   Former smoker 08/05/2012   TINEA CORPORIS 09/01/2009   DEPRESSION 08/11/2009   Chronic pain syndrome 04/15/2007   HYPERLIPIDEMIA NEC/NOS 11/02/2006   Peripheral vascular disease (HCC) 11/02/2006   MORBID OBESITY 10/28/2006   Obstructive sleep apnea 10/28/2006   POLYNEUROPATHY 10/28/2006   Benign essential HTN 10/28/2006   OSTEOARTHRITIS, KNEES, BILATERAL 10/28/2006   PROTEINURIA 11/25/2005   PCP:  Dillon Frames, FNP Pharmacy:   [facility] [id number] [name],  - [address] [place] [address] Phone: [phone] Fax: [phone]  [facility] [address] [place] [address] Phone: [phone] Fax: [phone]     Social Drivers of Health (SDOH) Social History: SDOH Screenings   Food Insecurity: Food Insecurity Present (07/31/2023)  Housing: Low Risk  (07/31/2023)  Transportation Needs: Unmet Transportation Needs (07/31/2023)  Utilities: Not At Risk (07/31/2023)  Financial Resource Strain: Not on File (06/14/2021)   Received from [place], [place]  Physical Activity: Not on File (06/14/2021)   Received from [place], [place]  Social Connections: Socially Isolated (07/31/2023)  Stress: Not on File (06/14/2021)   Received from [place], [place]  Tobacco Use: Medium Risk (07/30/2023)   SDOH Interventions: Food Insecurity Interventions: [employer] Provided, Inpatient [employer] Transportation Interventions: [employer] Provided, Inpatient TOC Social Connections Interventions: Patient Declined   Readmission Risk Interventions     No data to display

## 2023-08-01 NOTE — Discharge Instructions (Signed)
 [place] [facility] Address: [address] [place], [place] [address] Phone Number: [phone] Hours of Operation: Residents of [place] can come to obtain food Monday through Friday from 8:30am until 3:30pm. Photo ID and Social Security cards required for all residents of a household. Can come six times a year  [facility] Address: [address] [place], [place] [address] Phone Number: [phone] Hours of Operation: Operates Tuesday-Friday 10:00 a.m. to 1 p.m. Requirements: Referral from DSS needed. May come 6 times a year, 30 days apart. Photo ID and SS required for all residents of household.  [facility] Address: [address] [place], [place] [address] Phone Number: [phone] Hours of Operation: Food pantry is open on the last Saturday of each month from 10:00 am - 12:00 noon. No appointment needed. No qualifications.  [facility] Address: [address] [place], [place] [address] Phone Number: [phone] EXT. [phone] Hours of Operation: Must make reservations to pick up food on Saturdays. Sign ups for Saturday pick up beginning at 8:30 a.m. on Monday morning.  [facility] Address: [address] [place], [place] [address] Phone Number: [phone] Hours of Operation: If you need food, bring proper identification such as a driver's license to receive a bag of food once a month. Requirements: Can come once every 30 days with referral DSS, Holiday representative, Mental health etc. Each referral good for six visits. Photo ID required. *1st visit no referral required.  [facility] Address: [address], [place] [address] Phone Number: [phone]  [facility] Address: [address] [place], [place] [address] Phone Number: [phone] Hours of Operation:  You can register at [email or web address] for free groceries  FREE INDEED FOOD  PANTRY Address: [address], [place] [address] Phone Number: [phone] Hours of Operation: Drive through giveaway, first come first served. Every 3rd Saturday 11AM - 1PM  [facility] [address] Address: [address], [place] [address] Phone Number: [phone]   [place]  [facility] Address: [address] [place], [place] [address] Phone Number: [phone] Hours of Operation: Once a month every 3rd Saturday  [facility] Address: [address] [place], [place] [address] Phone Number: [phone] Hours of Operation: Distribution happens from 9:00-10:00 a.m. every Saturday.     [facility] Address: [address] [place], [place] [address] Phone Number: [phone] Hours of Operation: ONCE a week for the community food distribution held every Tuesday, Wednesday and Thursday from 11 a.m. - 2:00 p.m. Food is available on a first come, first serve basis and varies week to week. No appointment necessary for drive thru pick up.  [facility] Address: [address] [place], [place] [address] Phone Number: [phone] Hours of Operation: Open every 3rd Thursday 9:30 a.m. - 11:00 a.m.  [facility] Address: [address] [place], [place] [address] Phone Number: [phone] Hours of Operation: Please call for hours, directions, and questions  [facility] [place] [facility] Address: [address], [place], [place]  [address] Phone Number: [phone] Website: https://www.Hollyguns.co.za Food Finder app: https://findfood.ghpfa.org  [employer] Address: [address] [place], [place] [address] Phone Number: [phone] Hours of Operation: Contact Bree Harpe. Enrolled Substance Abuse Clients Only  [facility] Address: [address] [place], [address]  Phone Number: [phone] Hours of Operation: Contact Alene Ana. Food pantry open the 3rd Saturday of each month  from 9 a.m. -12 p.m. only  [place] [facility] Address: [address] [place], [place] [address] Phone Number: [phone] Hours of Operation: Contact Loletta Ripple. Emergency food bank open on Saturdays by appointment only  [facility] Address: [address] [place], [place] [address] Phone Number: [phone] Hours of Operation: No specific contact person; Anyone can help  [facility] Address: [address] [place], [place] [address] Phone Number: [phone] Hours of Operation: Contact Julia Oats. Agency gives out a bag of food every Thursday from 2-4 p.m. only, and also provides a community meal every Thursday between 5-6 p.m. Other services provided include rent/mortgage and utility assistance, women's winter shelter, thrift store, and senior adult activities.  [facility] [place] Address: [address] [place], [place] [address] Phone Number: [phone] Hours of Operation: The Emergency Food Assistance Program provides individuals and families with a generous supply of food including meat, fresh vegetables, and nonperishable items. The food box contains five days' worth of food, and each family or individual can receive a box once per month. M, W, Th, Fr 11am-2pm, walk-ins welcome.  [facility] Address: [address] [place], [place] [address]  Phone Number: [phone] Hours of Operation: Contact Asia Blanca Bunch. Tuesdays and Thursdays from 11am - 3pm by appointment only  Sunday, by APPOINTMENT ONLY  [address] of [place], [address] [place], [address], [phone], 3.2 mi from [facility], call in advance for appointment at 10:00am or at 4:00pm, must provide valid photo ID  Monday  9:30am-5:00pm [facility], [address] [place] [id number], [phone], 0.9 mi from [facility], can come four times per year, bring your photo ID and SS cards for other  residents of household, will make appointments for those who work and need to come after 5pm  10:00am-12:00noon [employer], [address]  [place], [address], [phone], 1.7 mi from [facility], can come once every 60 days per household, need referral from DSS, [employer], etc., bring photo ID and SS card   10:00am-1:00pm [employer] [facility],  [address] [place], [address], [phone], 7.7 mi from [facility], can come once every thirty days with a referral from DSS, [employer], Mental Health, etc. -- each referral good for six visits, bring photo ID   10:00am-1:00pm [address], [address], [address], [phone], 4.2 mi from [facility], can come once every 6 months, open to [facility] residents, bring photo ID and copy of a current utility bill in your name, please call first to verify that food is available  6:30pm-8:30pm [facility], [address], [address], [phone], 3.2 mi from [facility], can come once every 30 days, maximum 6 times per year, bring your photo ID and SS numbers for other residents of household  Monday by APPOINTMENT ONLY  [facility], [address] [place], [address],  [phone], 2.5 mi from [facility], call in advance for appointment between 10:00am-2:00pm, bring your photo ID and SS cards for all residents of household, can come once every 3 months  [facility], [address], [address], [phone], 0.7 mi from [facility], call in advance for appointment, can come once every 30 days, bring your photo ID and SS cards for other residents of  household   Tuesday  9:00am-12:00noon [employer], [address] [address], [phone], 1.3 mi from [facility], can come once every 3 months, bring your photo ID and SS numbers for other residents of household   9:00am-1:00pm [facility] [address]   [place], [address], [phone], 1.6 mi from [facility], can come once every two weeks  9:30am-5:00pm [employer], [address] [place] [phone], [phone], 0.9 mi from [facility], can come four times per year, bring your photo ID and SS cards for other residents of household, will make appointments for those who work and need to come after 5pm  10:00am-12:00noon [employer], [address]  [place], [address], [phone], 1.7 mi from [facility], can come once every 60 days per household, need referral from DSS, [employer], etc., bring photo ID and SS card  10:00am-1:00pm [facility], [id number],  [phone], 3.8 mi from [facility], with referral from DSS, may come six times, 30 days apart, bring your photo ID and SS cards for all residents of household   10:00am-1:00pm [address] the [facility], [address] [place], [address], [phone], 7.7 mi from [facility], can come once every thirty days with a referral from DSS, [employer], Mental Health, etc.- each referral good for six visits, bring photo ID   10:00am-1:00pm [address], [address], [address], [phone], 4.2 mi from [facility], can come once every 6 months, open to [facility] residents, bring photo ID and copy of a current utility bill in your name, please call first to verify that food is available  2:00pm-3:30pm [facility], [address], [phone], [phone], 3.7 mi from [facility], can come twelve times per year, one bag per family, bring photo ID   FIRST AND THIRD Tuesdays  10:00am-1:00pm, [address] [place], [address], [phone], 7.2 mi from [facility], can come once every 30 days   Tuesday, WHEN FOOD IS AVAILABLE (call)  12:00noon-2:00pm [facility], [address], [address], [phone], 1.5 mi from  [facility], can come once every 30 days, bring photo ID   Tuesday, by APPOINTMENT ONLY  [facility], [address] [place], [address],  [phone], 2.5 mi from [facility], call in advance for appointment between 1:00pm-4:00pm, bring your photo ID and SS cards for all residents of household, can come once every 3 months   [address] of [place], [address], [address], [phone], 5 mi from [facility], call one day ahead for appointment the next day between 10:00am and 12:00noon, can come once every 3 months, bring your photo ID and must qualify according to family income   [facility], [address], [address], [phone], 0.7 mi from [facility], call in advance for appointment, can come once every 30 days, bring your photo ID and SS cards for other residents of household   [address] of [place], Arsenio Larger [place], [address],  [phone], 5.1 mi from [facility], call between 9:00am and 1:00pm M-F to make appointment. Appointments are scheduled for Tues and Thurs from 10:00am-11:30am, bring photo ID, can come once every 6 months, limit three visits over  18 months, then must have referral  Wednesday  9:30am-5:00pm [facility], [address] [place] [phone], [phone], 0.9 mi from [facility], can come four times per year, bring your photo ID and SS cards for other residents of household, will make appointments for those who work and need to come after 5pm  9:30am-11:30am [employer] [facility], [address], [address], [phone], 6.6 mi from [facility], can come once every 30 days, bring your photo ID, and SS cards for other residents of household, each monthly visit requires a written referral from GUM or DSS with number in household on form  10:00am-12:00noon [address], [address] [place], [address], [phone], 1.7 mi from [facility], can come once every 60 days per household, need referral from DSS, [employer], etc., bring photo ID and SS card  10:00am-1:00pm [facility], [id number],  [phone], 3.9 mi from [facility], with referral from DSS, may come six times, 30 days apart, bring your photo ID and SS cards for all residents of household   10:00am-1:00pm [address] the [facility], [address] [place], [address], [phone], 7.7 mi from [facility], can come once every thirty days with a referral from DSS, [employer], Mental Health, etc. - each referral good for six visits, bring photo ID   2:00pm-5:45pm [address] [place], [address], [phone], 3.2 mi from [facility], once every 30 days, first come/first served, limited to first 25, bring photo ID   6:30pm-8:30pm [facility], [address], [address], [phone], 3.2 mi from [facility], can come once every 30 days, maximum 6 times per year, bring your photo ID and SS numbers for other residents of household  FIRST and THIRD Wednesdays   9:00am-12:00noon, [facility], [address] [place], [address], [phone], 4.2 mi from [facility], can come once a month. Please arrive and sign in no later than 11:15 so everyone can be served by 12 noon.  THIRD Wednesday  1:30pm-3:00pm, [place]. [address], [address] [place], [address], [phone] or [phone], 2.1 mi from [facility]  Wednesday, by APPOINTMENT ONLY  [address] [facility] of [place], [address] [address], [phone], 5 mi from [facility], call one day ahead for appointment the next day between 10:00am and 12:00noon, can come once every 3 months, bring your photo ID and must qualify according to family income   [facility], [address] [address], [phone], 0.7 mi from [facility], call in advance for  appointment, can come once every 30 days, bring your photo ID and SS cards for other residents of household   [address] of [place], [address] [place], [address], [phone], 3.2 mi from [facility], call in advance for appointment at 7:00pm, must provide valid photo ID  Thursday  9:00am-12:00noon [employer], [address], [address], [phone], 1.3 mi from [facility], can come once every 3 months, bring your photo ID and SS numbers for other residents of household   9:00am-1:00pm [facility] [address],  [place], [address], [phone]/Ext [phone], 1.6 mi from [facility], can come once every two weeks  9:30am-12:00noon [facility], [address] [place], [phone], 4.5 mi from [facility], can come once every 30 days, must state income [closed Thanksgiving and week of Christmas]  9:30am-5:00pm [employer], [address] [place] [phone], [phone], 0.9 mi from [facility], can come four times per year, bring your photo ID and SS card for other residents of household, will make appointments for those who work and need to come after 5pm  10:00am-1:00pm [facility], [address] [place], [id number],  [phone], 3.9 mi from [facility], with referral from DSS, may come six times, 30 days apart, bring your photo ID and SS cards for all residents of household   10:00am-1:00pm [address] the [facility], [address] [place], [address], [phone], 7.7 mi from [facility], can come once every thirty days with a referral from DSS, [employer], Mental Health, etc.- each referral good for six visits, bring photo ID   10:00am-1:00pm [facility], [address] [address], [phone], 4.2 mi from [facility], can come once every 6 months, open to [facility] residents, bring photo ID and copy of a current utility bill in your name, please call first to  verify that food is available   SUNDAYS BREAKFAST TWO LOCATIONS: 8:00am served in [facility] by Awaken PPL Corporation 8:30am SHUTTLE provided from [facility], served at [employer], [address] LUNCH TWO LOCATIONS [plus one additional third Sunday only] 10:30am - 12:30pm served at [employer], [employer], [place] [address] (1.2 miles from [facility]) 12:30pm served in [place] by Land O'Lakes Team (THIRD Sunday only) 1:30pm served at [facility] by [facility] one location [plus one additional third Sunday only] 5:00pm Every Sunday, served under the bridge at [address] by Lige Reeve Under the 3M Company (.7 miles from [facility]) (THIRD Sunday ONLY) 4:00pm served in the parking garage, across from Nucor Corporation, corner of Kimberly and [place] by Ryland Group Works Ministries MONDAYS BREAKFAST 7:30am served in [employer] by the United States [employer] Corporation and Friends LUNCH 10:30am - 12:30pm served at [employer], [employer], [place] [address] (1.2 miles from [facility]) DINNER TWO LOCATIONS: 7:00pm served in front of the courthouse at the corner of Goldman Sachs and Qwest Communications. by [place] Monday Night Meal (3 blocks from [facility]) 4:30pm served at the [employer], [address] by Bank of New York Company (0.6 miles from [facility]) TUESDAYS BREAKFAST 8:00am - 9:00am served at The TJX Companies, [address] (0.3 miles from [place]) LUNCH 10:30am - 12:30pm served at the E[employer], [employer] [address], (1.2 miles from [place]) DINNER 6:00pm served at CSX Corporation, enter from Capital One and go to the Sonic Automotive, (0.7 miles from [place]) [facility] BREAKFAST 7:00am - 8:00am served at [employer], [employer] [address], (1.2 miles from [place]) LUNCH ONE LOCATION [plus two additional locations listed below] 10:30am - 12:30pm served at [employer], [employer] [address] (1.2 miles from [place]) (FIRST Wednesday ONLY) 11:30am served at Dillard's, [place] [address] (6.6 miles from [place]) (SECOND Wednesday ONLY) 11:00am served at [place]. [place] of [address], [address] (1.3 miles from [place]) [place] TWO  LOCATIONS 6:00pm served at W. R. Berkley, [place] W. Visteon Corporation. (1.3 miles from [facility]) 4:00pm - 6:00pm (hot dogs and chips) served at Levi Strauss of [place] [facility], [address] (1.7 miles from [place]) [place] BREAKFAST NOT AVAILABLE AT THIS TIME LUNCH 10:30am - 12:30pm served at [employer], Liberty [employer], [place] [address] (1.2 miles from [place]) DINNER 6:00pm served at CSX Corporation, enter from Capital One and go to the Sonic Automotive, (0.7 miles from Nucor Corporation) [place] BREAKFAST NOT AVAILABLE AT THIS TIME LUNCH 10:30am - 12:30pm served at [employer], [employer] [address] (1.2 miles from [place]) DINNER TWO LOCATIONS, [plus one additional first Friday only] 6:00pm served under the bridge at [address] by Lige Reeve Under CSX Corporation. (.7 miles from [facility]) 5:00pm - 7:00pm served at Levi Strauss of [facility], [address] (1.7 miles from [place]) (FIRST Friday ONLY) 5:45 pm - SHUTTLE provided from the LIBRARY at 5:45pm. Served at [facility], [address] [place]. SATURDAYS BREAKFAST TWO LOCATIONS [plus one additional last Saturday only] 8:00am served at [facility] by Delphi 8:30am served at Pul[employer] Homes, [address] (2.2 miles from [facility]) (LAST  Saturday ONLY) 8:30am served at Beazer Homes, [address] (5 miles from [place]) LUNCH 10:30am - 12:30pm served at Ecol[employer], [employer] [address] (1.2 miles from [facility]) DINNER 6:00pm served under the bridge at [address] by World Fuel Services Corporation (0.7 miles from Nucor Corporation)  DIRECTIONS FROM [place] TO ALL MEAL LOCATIONS The Bridge at [address] (.7 miles from [address]) [address] on [place]. Turn Right onto DIRECTV 433 ft. Continue onto [address] under bridge, about 500 ft. Courthouse (3 blocks from [facility]) [place] on [address]. Turn right on [place]  1 block to PPL Corporation (.5 miles from [place]) [place] on [place]. YRC Worldwide. past Brink's Company to EMCOR. Enter from Capital One and go to the Affiliated Computer Services building W. R. Berkley [address] Visteon [address]. (1.3 miles from [facility]) [address] on [place]. Turn Right onto W. Melanie Spires. church will be on the Left. The TJX Companies [address] (.3 miles from [facility]) Go .3 miles on W. Friend[address] Destination is on your right Dillard's at ONEOK (6.6 miles from [address]) [address] on [place] toward W Friend[address] Turn right onto W Friend[address] Continue onto Alcoa Inc. Continue onto Toll Brothers. 5. Latina Pol is on right Sanmina-SCI Futures trader) [address] (.6 miles from [place]) [place] on [place]. [address] Turn Left onto [address] 0.3 miles Destination is on the Left. [place] Black & Decker at American Express (5 miles from Nucor Corporation) 1. Head south on [address]. Turn right onto [address] Friend[address] Turn slightly left onto Quest Diagnostics Continue onto Quest Diagnostics Turn right at Barnes & Noble Continue to church on right [place] Sounds of [facility] [address] (1.7 miles from [place]) [address] on [place] 1.4 miles College becomes [place]. Elm  Jeryl Moris. Continue 0.6 miles and church will be on theright. [place] Guardian Life Insurance at [address] (2.5 miles from Nucor Corporation) [place] provided from Massachusetts Mutual Life [place]] [place] on [place]. Elm toward Estée Lauder right onto Costco Wholesale left onto Emerson Electric Turn left onto Micron Technology [address] [place]  [place] (2.2 miles from [address]) [address] on [place] 1.4 miles [place] becomes [place]. [place] [address] Turn right onto [address] [place]  [address] and church will be on the Left. Potter's House/Dodge AT&T [address] (1.2 miles from [facility]) 1.Turn right onto [facility] 2.Turn left onto [place] 3.[place] 4.Destination is on your right [place]. [place] of [address] at ToysRus (1.3 miles from [facility]) [address] on [address] Turn left onto Genuine Parts right onto S. [place]. Continue onto KB Home	[place]. Turn left onto Smurfit-Stone Container. Turn right onto WellPoint.

## 2023-08-01 NOTE — Progress Notes (Signed)
 Heart Failure Navigator Progress Note  Assessed for Heart & Vascular TOC clinic readiness.  Patient does not meet criteria due to EF 55-60%, No HF TOC per Dr. Jomarie Longs.   Navigator will sign off at this time.   Rhae Hammock, BSN, Scientist, clinical (histocompatibility and immunogenetics) Only

## 2023-08-01 NOTE — Evaluation (Signed)
 Physical Therapy Evaluation Patient Details Name: [name] MRN: [id number] DOB: 1955-07-06 Today's Date: 08/01/2023  History of Present Illness  67/F presented to the ED 07/30/23 with progressive shortness of breath, swelling. Found to be in A-fib with RVR, volume overloaded and mildly hypoxic requiring 2L O2 via trach collar. Chest x-ray with pulmonary vascular congestion and CHF changes. Admitted for treatment of Acute on chronic diastolic CHF Acute hypoxic respiratory failure, and Paroxysmal A-fib with RVR WUJ:WJXB and neck CA status post total laryngectomy, tracheostomy, former smoker, obese, type II diabetic, peripheral vascular disease with left BKA, paroxysmal A-fib poorly compliant with Eliquis , OSA, history of COPD, chronic pain syndrome, narcotic dependence  Clinical Impression  PTA pt living with son in single story home with make-shift ramp to enter. Pt independent with transfers to and from drop arm BSC and wheelchair from bed with slide board. Pt is able to perform all her own bathing and dressing. Son assists with navigation of ramp to get to [facility] for MD appointments. Pt is at her baseline level of function with independence in bed mobility and set up for transfers. Pt does have increased O2 demand with mobilization, requiring 3L O2 via [place] to maintain SpO2 >90%O2. Pt has no PT needs at this time. OT provided information about Aging [facility] for possible ramp upfit.       If plan is discharge home, recommend the following: Help with stairs or ramp for entrance;Assist for transportation   Can travel by private vehicle    Yes    Equipment Recommendations Wheelchair (measurements PT);Wheelchair cushion (measurements PT);BSC/3in1 (needs 20 inch wheelchair. and drop arm BSC, hers has worn out.)        Precautions / Restrictions Precautions Precautions: Fall Restrictions Weight Bearing Restrictions Per Provider Order: No      Mobility  Bed Mobility Overal bed mobility:  Independent             General bed mobility comments: able to roll in all directions and perform her own pericare    Transfers Overall transfer level: Modified independent                 General transfer comment: set up with drop arm recliner and management of pads, pt able to make transfer with no assist          Balance Overall balance assessment: Independent (independent in sitting balance, does not stand)                                           Pertinent Vitals/Pain Pain Assessment Pain Assessment: No/denies pain    Home Living Family/patient expects to be discharged to:: Private residence Living Arrangements: Children Available Help at Discharge: Family;Available 24 hours/day Type of Home: House Home Access: Ramped entrance (make shift ramp only goes out to dr appointment 1x/month)       Home Layout: One level Home Equipment: Wheelchair - manual;Wheelchair - power;BSC/3in1 (all worn out has had more than 5 years) Additional Comments: She does not stand, uses sliding board.    Prior Function Prior Level of Function : Independent/Modified Independent             Mobility Comments: transfers with slide board, to her wheelchair and [facility] without assist. has makeshift ramp and requires assist to navigate, uses [employer] to get to MD appointments ADLs Comments: does her own bathing and dressing, some  cooking     Extremity/Trunk Assessment   Upper Extremity Assessment Upper Extremity Assessment: Defer to OT evaluation    Lower Extremity Assessment Lower Extremity Assessment: LLE deficits/detail;RLE deficits/detail RLE Deficits / Details: UNNA boot in place due to increased LE venous insufficiency LLE Deficits / Details: L BKA hip and knee ROM WFL and is able to assist inmobilization    Cervical / Trunk Assessment Cervical / Trunk Assessment: Normal  Communication   Communication Communication: Impaired Factors Affecting  Communication: Trach/intubated (long term trach)    Cognition Arousal: Alert Behavior During Therapy: WFL for tasks assessed/performed   PT - Cognitive impairments: No apparent impairments                       PT - Cognition Comments: sharp as a tack Following commands: Intact       Cueing Cueing Techniques: Verbal cues, Visual cues     General Comments General comments (skin integrity, edema, etc.): Pt on 3L O2 via trach collar to maintain SpO2 >90% with mobilization,        Assessment/Plan    PT Assessment Patient does not need any further PT services         PT Goals (Current goals can be found in the Care Plan section)  Acute Rehab PT Goals PT Goal Formulation: All assessment and education complete, DC therapy    Frequency       Co-evaluation PT/OT/SLP Co-Evaluation/Treatment: Yes Reason for Co-Treatment: Complexity of the patient's impairments (multi-system involvement) PT goals addressed during session: Mobility/safety with mobility;Balance         AM-PAC PT "6 Clicks" Mobility  Outcome Measure Help needed turning from your back to your side while in a flat bed without using bedrails?: None Help needed moving from lying on your back to sitting on the side of a flat bed without using bedrails?: None Help needed moving to and from a bed to a chair (including a wheelchair)?: None Help needed standing up from a chair using your arms (e.g., wheelchair or bedside chair)?: Total Help needed to walk in hospital room?: Total Help needed climbing 3-5 steps with a railing? : Total 6 Click Score: 15    End of Session Equipment Utilized During Treatment: Oxygen Activity Tolerance: Patient tolerated treatment well Patient left: in chair;with call bell/phone within reach;with family/visitor present Nurse Communication: Mobility status      Time: 1018-1050 PT Time Calculation (min) (ACUTE ONLY): 32 min   Charges:   PT Evaluation $PT Eval Low  Complexity: 1 Low   PT General Charges $$ ACUTE PT VISIT: 1 Visit         Ayodele Sangalang B. Jewel Mortimer PT, DPT Acute Rehabilitation Services Please use secure chat or  Call Office [phone]   [name] Fleet 08/01/2023, 11:12 AM

## 2023-08-01 NOTE — Progress Notes (Signed)
 PT Cancellation Note  Patient Details Name: [name] MRN: [id number] DOB: 04/25/1955   Cancelled Treatment:    Reason Eval/Treat Not Completed: Other (comment) Pt on virtual court meeting on ipad. Will return as schedule allows.   Anthone Prieur B. Jewel Mortimer PT, DPT Acute Rehabilitation Services Please use secure chat or  Call Office [phone]  [name] [facility] 08/01/2023, 8:35 AM

## 2023-08-01 NOTE — Plan of Care (Signed)

## 2023-08-01 NOTE — Care Management Important Message (Signed)
 Important Message  Patient Details  Name: CHUDNEY SCHEFFLER MRN: [id number] Date of Birth: [birth year]   Important Message Given:  Yes - Medicare IM     Wynonia Hedges 08/01/2023, 3:05 PM

## 2023-08-01 NOTE — Progress Notes (Signed)
 PROGRESS NOTE    [name]  ZOX:[id number] DOB: 1955/08/09 DOA: 07/30/2023 PCP: Dillon Frames, FNP  [name] is a 67/F with history of head and neck CA status post total laryngectomy, tracheostomy, former smoker, obese, type II diabetic, peripheral vascular disease with left BKA, paroxysmal A-fib poorly compliant with Eliquis , OSA, history of COPD, chronic pain syndrome, narcotic dependence presented to the ED with progressive shortness of breath, swelling.  She reports chronic orthopnea at baseline.  Mild chronic cough is unchanged, intermittent wheezing reported as well, denies fevers chills or increased secretions at this time.  Previously was on Lasix  but has not taken this for years ED Course: Noted to be in A-fib with RVR, volume overloaded, mildly hypoxic placed on 2 L O2 via trach collar, labs noted creatinine 1.1, glucose 247, BNP 151, troponin 16, hemoglobin 15. - Chest x-ray with pulmonary vascular congestion and CHF changes, EKG noted A-fib RVR, given IV Lasix  and started on Cardizem  gtt.   Subjective: -Feels much better, breathing is improving  Assessment and Plan:   Acute on chronic diastolic CHF Acute hypoxic respiratory failure -2D echo noted preserved EF, indeterminate diastolic function, normal RV - Improving with diuresis, 2.9 L negative  - Continue IV Lasix  today, started on Aldactone c -poor candidate for SGLT2i -Wean O2 as tolerated -Would benefit from rhythm control   Paroxysmal A-fib with RVR - Heart rate remains suboptimal, increase Cardizem  dose - She is poorly compliant with Eliquis  at baseline - TSH is normal   Chronic tracheostomy -Routine trach care     Chronic pain syndrome -Home regimen of narcotics continued     Peripheral vascular disease (HCC) Left BKA   COPD, former smoker -Continue home regimen of albuterol  as needed and ICS/LABA   Type 2 diabetes mellitus with hyperglycemia - CBGs improving, continue current dose of glargine  and meal coverage, A1c is 7.9   DVT prophylaxis: Eliquis  Code Status: DNR per patient wishes Family Communication: Son at bedside Disposition Plan: Home pending above workup  Objective: Vitals:   08/01/23 0000 08/01/23 0316 08/01/23 0600 08/01/23 0807  BP:  (!) 145/85 132/86 (!) 150/99  Pulse: (!) 126 100 (!) 110 (!) 103  Resp:  16 (!) 22 17  Temp: 97.8 F (36.6 C) 97.7 F (36.5 C)  97.7 F (36.5 C)  TempSrc:  Oral  Oral  SpO2: 98% 91% (!) 87% 96%    Intake/Output Summary (Last 24 hours) at 08/01/2023 1014 Last data filed at 08/01/2023 0807 Gross per 24 hour  Intake 1304.31 ml  Output 3200 ml  Net -1895.69 ml   There were no vitals filed for this visit.  Examination:  General exam: Obese pleasant female sitting up in bed, AAO x 3 H he T: Tracheostomy with trach collar Respiratory system: Few scattered rhonchi, rare basilar Rales Cardiovascular system: S1 & S2 heard, irregular rhythm Abd: nondistended, soft and nontender.Normal bowel sounds heard. Central nervous system: Alert and oriented. No focal neurological deficits. Extremities: Trace edema, left BKA Skin: No rashes Psychiatry:  Mood & affect appropriate.     Data Reviewed:   CBC: Recent Labs  Lab 07/30/23 1303 07/30/23 1341 07/31/23 0733  WBC 7.7  --  7.5  NEUTROABS 5.4  --   --   HGB 15.2* 16.0* 16.3*  HCT 47.3* 47.0* 51.5*  MCV 91.0  --  91.5  PLT 233  --  247   Basic Metabolic Panel: Recent Labs  Lab 07/30/23 1303 07/30/23 1341 07/31/23 0733  08/01/23 0311  NA 139 140 136 138  K 3.7 3.8 4.4 3.4*  CL 99 101 95* 95*  CO2 28  --  32 32  GLUCOSE 247* 250* 319* 243*  BUN 19 22 23  28*  CREATININE 1.12* 1.10* 1.22* 1.41*  CALCIUM  8.6*  --  8.6* 8.3*  MG  --   --  2.1  --    GFR: CrCl cannot be calculated (Unknown ideal weight.). Liver Function Tests: Recent Labs  Lab 07/31/23 0733  AST 14*  ALT 19  ALKPHOS 90  BILITOT 0.6  PROT 7.2  ALBUMIN  3.7   No results for input(s): "LIPASE",  "AMYLASE" in the last 168 hours. No results for input(s): "AMMONIA" in the last 168 hours. Coagulation Profile: No results for input(s): "INR", "PROTIME" in the last 168 hours. Cardiac Enzymes: No results for input(s): "CKTOTAL", "CKMB", "CKMBINDEX", "TROPONINI" in the last 168 hours. BNP (last 3 results) No results for input(s): "PROBNP" in the last 8760 hours. HbA1C: Recent Labs    07/31/23 0824  HGBA1C 7.9*   CBG: Recent Labs  Lab 07/31/23 1205 07/31/23 1616 08/01/23 0637  GLUCAP 252* 170* 158*   Lipid Profile: No results for input(s): "CHOL", "HDL", "LDLCALC", "TRIG", "CHOLHDL", "LDLDIRECT" in the last 72 hours. Thyroid Function Tests: Recent Labs    07/31/23 0734  TSH 1.674   Anemia Panel: No results for input(s): "VITAMINB12", "FOLATE", "FERRITIN", "TIBC", "IRON", "RETICCTPCT" in the last 72 hours. Urine analysis:    Component Value Date/Time   COLORURINE YELLOW 07/03/2021 2032   APPEARANCEUR HAZY (A) 07/03/2021 2032   LABSPEC 1.015 07/03/2021 2032   PHURINE 8.0 07/03/2021 2032   GLUCOSEU NEGATIVE 07/03/2021 2032   HGBUR SMALL (A) 07/03/2021 2032   BILIRUBINUR NEGATIVE 07/03/2021 2032   KETONESUR NEGATIVE 07/03/2021 2032   PROTEINUR 100 (A) 07/03/2021 2032   UROBILINOGEN 0.2 03/20/2014 0237   NITRITE POSITIVE (A) 07/03/2021 2032   LEUKOCYTESUR LARGE (A) 07/03/2021 2032   Sepsis Labs: @LABRCNTIP (procalcitonin:4,lacticidven:4)  ) Recent Results (from the past 240 hours)  MRSA Next Gen by PCR, Nasal     Status: None   Collection Time: 07/31/23  3:02 PM   Specimen: Nasal Mucosa; Nasal Swab  Result Value Ref Range Status   MRSA by PCR Next Gen NOT DETECTED NOT DETECTED Final    Comment: (NOTE) The GeneXpert MRSA Assay (FDA approved for NASAL specimens only), is one component of a comprehensive MRSA colonization surveillance program. It is not intended to diagnose MRSA infection nor to guide or monitor treatment for MRSA infections. Test performance is  not FDA approved in patients less than 32 years old. Performed at [facility], [address], [place], [place] [address]      Radiology Studies: ECHOCARDIOGRAM COMPLETE Result Date: 08/01/2023    ECHOCARDIOGRAM REPORT   Patient Name:   [name] Date of Exam: 08/01/2023 Medical Rec #:  [id number]    Height:       66.0 in Accession #:    [id number]   Weight:       256.0 lb Date of Birth:  1955/07/18    BSA:          2.221 m Patient Age:    67 years     BP:           150/99 mmHg Patient Gender: F            HR:           116 bpm. Exam Location:  Inpatient Procedure: 2D Echo, Cardiac Doppler, Color Doppler and Intracardiac            Opacification Agent (Both Spectral and Color Flow Doppler were            utilized during procedure). Indications:    CHF  History:        Patient has prior history of Echocardiogram examinations, most                 recent 09/09/2022. Risk Factors:Hypertension and Diabetes.  Sonographer:    Janette Medley Referring Phys: [id number] Brinna Divelbiss IMPRESSIONS  1. Left ventricular ejection fraction, by estimation, is 55 to 60%. The left ventricle has normal function. The left ventricle has no regional wall motion abnormalities. There is mild left ventricular hypertrophy. Left ventricular diastolic parameters are indeterminate.  2. Right ventricular systolic function is normal. The right ventricular size is normal.  3. Left atrial size was moderately dilated.  4. The mitral valve is abnormal. No evidence of mitral valve regurgitation. No evidence of mitral stenosis. Severe mitral annular calcification.  5. The aortic valve is tricuspid. There is mild calcification of the aortic valve. There is mild thickening of the aortic valve. Aortic valve regurgitation is not visualized. Aortic valve sclerosis is present, with no evidence of aortic valve stenosis.  6. Aortic dilatation noted. There is mild dilatation of the ascending aorta, measuring 40 mm.  7. The inferior vena cava is dilated in  size with >50% respiratory variability, suggesting right atrial pressure of 8 mmHg. FINDINGS  Left Ventricle: Left ventricular ejection fraction, by estimation, is 55 to 60%. The left ventricle has normal function. The left ventricle has no regional wall motion abnormalities. Strain was performed and the global longitudinal strain is indeterminate. The left ventricular internal cavity size was normal in size. There is mild left ventricular hypertrophy. Left ventricular diastolic parameters are indeterminate. Right Ventricle: The right ventricular size is normal. No increase in right ventricular wall thickness. Right ventricular systolic function is normal. Left Atrium: Left atrial size was moderately dilated. Right Atrium: Right atrial size was normal in size. Pericardium: There is no evidence of pericardial effusion. Mitral Valve: The mitral valve is abnormal. There is severe thickening of the mitral valve leaflet(s). There is severe calcification of the mitral valve leaflet(s). Severe mitral annular calcification. No evidence of mitral valve regurgitation. No evidence of mitral valve stenosis. Tricuspid Valve: The tricuspid valve is normal in structure. Tricuspid valve regurgitation is not demonstrated. No evidence of tricuspid stenosis. Aortic Valve: The aortic valve is tricuspid. There is mild calcification of the aortic valve. There is mild thickening of the aortic valve. Aortic valve regurgitation is not visualized. Aortic valve sclerosis is present, with no evidence of aortic valve stenosis. Pulmonic Valve: The pulmonic valve was normal in structure. Pulmonic valve regurgitation is trivial. No evidence of pulmonic stenosis. Aorta: Aortic dilatation noted. There is mild dilatation of the ascending aorta, measuring 40 mm. Venous: The inferior vena cava is dilated in size with greater than 50% respiratory variability, suggesting right atrial pressure of 8 mmHg. IAS/Shunts: No atrial level shunt detected by color  flow Doppler. Additional Comments: 3D was performed not requiring image post processing on an independent workstation and was indeterminate.  LEFT VENTRICLE PLAX 2D LVIDd:         5.00 cm   Diastology LVIDs:         3.40 cm   LV e' medial:    7.29 cm/s LV PW:  1.10 cm   LV E/e' medial:  19.9 LV IVS:        1.30 cm   LV e' lateral:   7.18 cm/s LVOT diam:     2.20 cm   LV E/e' lateral: 20.2 LV SV:         60 LV SV Index:   27 LVOT Area:     3.80 cm  RIGHT VENTRICLE             IVC RV S prime:     10.00 cm/s  IVC diam: 2.60 cm TAPSE (M-mode): 2.0 cm LEFT ATRIUM             Index        RIGHT ATRIUM           Index LA diam:        4.80 cm 2.16 cm/m   RA Area:     18.40 cm LA Vol (A2C):   77.7 ml 34.99 ml/m  RA Volume:   46.80 ml  21.07 ml/m LA Vol (A4C):   50.0 ml 22.51 ml/m LA Biplane Vol: 64.0 ml 28.82 ml/m  AORTIC VALVE LVOT Vmax:   90.70 cm/s LVOT Vmean:  59.200 cm/s LVOT VTI:    0.158 m  AORTA Ao Root diam: 3.00 cm Ao Asc diam:  4.00 cm MITRAL VALVE MV Area (PHT): 2.84 cm     SHUNTS MV Decel Time: 267 msec     Systemic VTI:  0.16 m MV E velocity: 145.00 cm/s  Systemic Diam: 2.20 cm Janelle Mediate MD Electronically signed by Janelle Mediate MD Signature Date/Time: 08/01/2023/9:54:58 AM    Final    DG Chest Port 1 View Result Date: 07/30/2023 CLINICAL DATA:  Shortness of breath for the past 2-3 days.  Hypoxia. EXAM: PORTABLE CHEST 1 VIEW COMPARISON:  09/08/2022 FINDINGS: Improved inspiration. Heart size near the upper limit of normal. Prominent pulmonary vasculature and interstitial markings with mild progression. Probable small bilateral pleural effusions. Thoracic spine degenerative changes. IMPRESSION: Mild changes of acute congestive heart failure. Electronically Signed   By: Catherin Closs M.D.   On: 07/30/2023 13:49     Scheduled Meds:  apixaban   5 mg Oral BID   arformoterol   15 mcg Nebulization BID   And   umeclidinium bromide   1 puff Inhalation Daily   atorvastatin   20 mg Oral Daily    buprenorphine -naloxone   1 tablet Sublingual Q8H   diltiazem   90 mg Oral Q8H   furosemide   40 mg Intravenous BID   insulin  aspart  5 Units Subcutaneous TID WC   insulin  glargine-yfgn  40 Units Subcutaneous Daily   pregabalin   75 mg Oral Q12H   spironolactone  12.5 mg Oral Daily   Continuous Infusions:     LOS: 2 days    Time spent:    Deforest Fast, MD [facility]   08/01/2023, 10:14 AM

## 2023-08-01 NOTE — Progress Notes (Signed)
 SATURATION QUALIFICATIONS: (This note is used to comply with regulatory documentation for home oxygen)  Patient Saturations on Room Air at Rest = 90%  Patient Saturations on Room Air while mobilizing = 87%  Patient Saturations on 3 Liters of oxygen while mobilizing = 92%  Please briefly explain why patient needs home oxygen:  Pt requires 3L of O2 via trach collar to maintain SpO2 <90% O2 with mobilization.  Lacrisha Bielicki B. Jewel Mortimer PT, DPT Acute Rehabilitation Services Please use secure chat or  Call Office [phone]

## 2023-08-01 NOTE — TOC Progression Note (Signed)
 Transition of Care [facility]) - Progression Note    Patient Details  Name: [name] MRN: [id number] Date of Birth: Sep 03, 1955  Transition of Care [facility]) CM/SW Contact  Jeani Mill, RN Phone Number: 08/01/2023, 4:00 PM  Clinical Narrative:    Spoke to son and patient at bedside. Patient unable to get replacement wheelchair due to asset recovery status. Gave patient Kathy's number ,[employer], that has used free DME. Ordered BSC with drop down arm from jermaine with [employer]. TOC will continue to follow for needs.    Expected Discharge Plan: Home/Self Care Barriers to Discharge: Continued Medical Work up  Expected Discharge Plan and Services       Living arrangements for the past 2 months: Single Family Home                 DME Arranged: Bedside commode DME Agency: [employer] Date DME Agency Contacted: 08/01/23 Time DME Agency Contacted: [phone] Representative spoke with at DME Agency: Zula Hitch             Social Determinants of Health (SDOH) Interventions SDOH Screenings   Food Insecurity: Food Insecurity Present (07/31/2023)  Housing: Low Risk  (07/31/2023)  Transportation Needs: Unmet Transportation Needs (07/31/2023)  Utilities: Not At Risk (07/31/2023)  Financial Resource Strain: Not on File (06/14/2021)   Received from [place], [place]  Physical Activity: Not on File (06/14/2021)   Received from [place], [place]  Social Connections: Socially Isolated (07/31/2023)  Stress: Not on File (06/14/2021)   Received from [place], [place]  Tobacco Use: Medium Risk (07/30/2023)    Readmission Risk Interventions     No data to display

## 2023-08-01 NOTE — Evaluation (Signed)
 Occupational Therapy Evaluation and Discharge Patient Details Name: [name] MRN: [id number] DOB: [birth year] Today's Date: 08/01/2023   History of Present Illness   67/F presented to the ED 07/30/23 with progressive shortness of breath, swelling. Found to be in A-fib with RVR, volume overloaded and mildly hypoxic requiring 2L O2 via trach collar. Chest x-ray with pulmonary vascular congestion and CHF changes. 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 She does need a drop arm commode and a wider wheelchair with cushion for home. Provided information on [employer] for possibly fabrication of a safer ramp. No further OT needs.      If plan is discharge home, recommend the following:   Assistance with cooking/housework;Assist for transportation;Help with stairs or ramp for entrance     Functional Status Assessment   Patient has not had a recent decline in their functional status     Equipment Recommendations    (drop arm BSC and 20 inch w/c with cushion)     Recommendations for Other Services         Precautions/Restrictions   Precautions Precautions: Fall Recall of Precautions/Restrictions: Intact Restrictions Weight Bearing Restrictions Per Provider Order: No     Mobility Bed Mobility Overal bed mobility: Independent                  Transfers Overall transfer level: Modified independent                 General transfer comment: set up with drop arm recliner and management of pads, pt able to make transfer with no assist       Balance                                           ADL either performed or assessed with clinical judgement   ADL Overall ADL's : At baseline;Modified independent                                             Vision Ability to See in Adequate Light: 0 Adequate Patient Visual Report: No change from baseline       Perception         Praxis         Pertinent Vitals/Pain       Extremity/Trunk Assessment Upper Extremity Assessment Upper Extremity Assessment: Overall WFL for tasks assessed   Lower Extremity Assessment Lower Extremity Assessment: Defer to PT evaluation RLE Deficits / Details: UNNA boot in place due to increased LE venous insufficiency LLE Deficits / Details: L BKA hip and knee ROM WFL and is able to assist inmobilization   Cervical / Trunk Assessment Cervical / Trunk Assessment: Normal   Communication Communication Communication: Impaired Factors Affecting Communication: Trach/intubated   Cognition Arousal: Alert Behavior During Therapy: WFL for tasks assessed/performed Cognition: No apparent impairments  Following commands: Intact       Cueing  General Comments   Cueing Techniques: Verbal cues  Pt on 3L O2 via trach collar to maintain SpO2 >90% with mobilization,   Exercises     Shoulder Instructions      Home Living Family/patient expects to be discharged to:: Private residence Living Arrangements: Children Available Help at Discharge: Family;Available 24 hours/day Type of Home: House Home Access: Ramped entrance (make shift)     Home Layout: One level     Bathroom Shower/Tub: Tub/shower unit;Sponge bathes at baseline   Bathroom Toilet: Standard Bathroom Accessibility: Yes   Home Equipment: Wheelchair - manual;Wheelchair - power;Other (comment);BSC/3in1 (transfer board)   Additional Comments: She does not stand, uses sliding board.       Prior Functioning/Environment Prior Level of Function : Independent/Modified Independent             Mobility Comments: transfers with slide board, to her wheelchair and [facility] without assist. has makeshift ramp and requires assist to navigate, uses [employer] to get to MD appointments ADLs Comments: does her own bathing and dressing, some cooking    OT Problem List:     OT Treatment/Interventions:        OT Goals(Current goals can be found in the care plan section)       OT Frequency:       Co-evaluation   Reason for Co-Treatment: Complexity of the patient's impairments (multi-system involvement) PT goals addressed during session: Mobility/safety with mobility;Balance        AM-PAC OT "6 Clicks" Daily Activity     Outcome Measure Help from another person eating meals?: None Help from another person taking care of personal grooming?: None Help from another person toileting, which includes using toliet, bedpan, or urinal?: None Help from another person bathing (including washing, rinsing, drying)?: None Help from another person to put on and taking off regular upper body clothing?: None Help from another person to put on and taking off regular lower body clothing?: None 6 Click Score: 24   End of Session Nurse Communication: Mobility status  Activity Tolerance: Patient tolerated treatment well Patient left: with call bell/phone within reach;in chair  OT Visit Diagnosis: Muscle weakness (generalized) (M62.81)                Time: 1012-1050 OT Time Calculation (min): 38 min Charges:  OT General Charges $OT Visit: 1 Visit OT Evaluation $OT Eval Low Complexity: 1 Low $OT Eval Moderate Complexity: 1 Mod  Avanell Leigh, OTR/L Acute Rehabilitation Services Office: [phone]   [name] 08/01/2023, 11:35 AM

## 2023-08-01 NOTE — Progress Notes (Signed)
 OT Cancellation Note  Patient Details Name: [name] MRN: [id number] DOB: [birth year]   Cancelled Treatment:    Reason Eval/Treat Not Completed:  (Pt on virtual court meeting on ipad. Will return as schedule allows.)  [name] 08/01/2023, 8:34 AM Avanell Leigh, OTR/L Acute Rehabilitation Services Office: [phone]

## 2023-08-01 NOTE — Consult Note (Addendum)
 Cardiology Consultation   Patient ID: OTILIA Vasquez MRN: [id number]; DOB: [birth year]  Admit date: 07/30/2023 Date of Consult: 08/01/2023  PCP:  Dillon Frames, FNP   [facility] Providers Cardiologist:  Hazle Lites, MD     Patient Profile: [name] is a 68 y.o. 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  History of Present Illness: [name] is a 68 year old female with a history listed above who is new to [facility].  She was diagnosed with atrial fibrillation during an admission in May 2022. At that time her echo showed EF 65-70% and severe LAE, and she was discharged on Eliquis  and 240 mg cardizem . She has not seen a cardiologist. No prior cardiac history.     During an admission in May 2023 noted to have poor medication compliance with eliquis . Her medication non-compliance has been ongoing. When interviewed she reported she was educated to not take eliquis  with her advil , and she uses advil  for her chronic pain syndrome.  In July 2024 she was admitted to [facility] with shortness of breath at that time thought to be related to a COPD exacerbation.ECG showed normal sinus rhythm. Repeat  echocardiogram (08/2022) showed LVEF of 55 to 60% with normal function, moderate left ventricular hypertrophy and mild hypokinesis of the distal inferior septal wall though poor visualization due to a technically difficult study.   Presented to the ED on 07/30/23 for shortness of breath and worsening edema. She was treated for COPD exacerbation with DuoNebs and prednisone .  Physical exam showed wheezing and volume overload.   Noted to be in coarse atrial fibrillation/atypical atrial flutter with rates in the 130s. BNP was 151.  Glucose 250.  Creatinine 1.12. Troponin negative. CXR in the ED showed prominent pulmonary vasculature and interstitial markings small bilateral pleural effusions. She was started on a diltiazem  drip and given IV 40 mg Lasix .   Since admission she has been transitioned from IV diltiazem  that had been titrated to 15 mg/h to PO diltiazem  90 mg every 8 hours and continued on IV lasix  40 mg BID. She was also started on 12.5 mg spironolactone. Creatinine bumped to 1.41, potassium dropped to 3.4 (from 4.4), K scheduled to be repleted.  Echo today shows EF of 55 to 60% with no wall motion abnormalities and mild left ventricular hypertrophy. Normal RV.  LA moderately dilated. Severe MAC. Aortic valve sclerosis. IVC dilated right atrial pressure estimated to be 8 mmHg.  She has been having shortness of breath for over a week with progression on 07/30/23 prompting her arrival to the ED. Reports orthopnea and PND. Peripheral edema of her extremities has been present for 25 years, however over the last few months she has been noticing it worsening with new abdominal distention. She has nausea and her appetite has decreased in the last week. Denies chest pain, though she does have irritation around her trach collar which is new as of yesterday. She reports feeling better after IV diuresis from a breathing and volume standpoint, though she feels more tired and nauseous. She does have frequent palpitations, last occurring this morning.   She was told that she  cannot use advil  and eliquis  together, so she has been opting to take advil  over eliquis  for her chronic pain. She thinks she takes the eliquis  maybe once a day.   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  Past Medical History:  Diagnosis Date   Anxiety    Arthritis    COPD (chronic obstructive pulmonary disease) (HCC)    Depression    Diabetes mellitus without complication (HCC)    INSULIN  DEPENDENT   Diabetic neuropathy (HCC)    Difficult intubation    total laryngectomy   Fibromyalgia    Head and neck cancer (HCC) 04/19/2015   Hypertension    Kidney stone    Morbid obesity with BMI of 40.0-44.9, adult (HCC)    Neuropathy    Nocturia    Pneumonia    Rash, skin    face   Renal insufficiency    Shortness of breath    Sleep apnea    has not used cpap past 10 yrs   Urge incontinence     Past Surgical History:  Procedure Laterality Date   AMPUTATION Left 08/25/2015   Procedure: 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;  Surgeon: Timothy Ford, MD;  Location: [facility] OR;  Service: Orthopedics;  Laterality: Left;   AMPUTATION Left 09/13/2015   Procedure: AMPUTATION BELOW KNEE;  Surgeon: Adah Acron, MD;  Location: [facility] OR;  Service: Orthopedics;  Laterality: Left;   BRAIN SURGERY     vertebral aneurysm   BREAST LUMPECTOMY  1982   benign   CARPEL TUNNEL     CHOLECYSTECTOMY     CYSTOSCOPY W/ URETERAL STENT PLACEMENT Left 03/22/2014   Procedure: CYSTOSCOPY WITH RETROGRADE PYELOGRAM/URETERAL STENT PLACEMENT;  Surgeon: Osborn Blaze, MD;  Location: [facility] OR;  Service: Urology;  Laterality: Left;   CYSTOSCOPY WITH RETROGRADE PYELOGRAM, URETEROSCOPY AND STENT PLACEMENT Left 05/11/2014   Procedure: CYSTOSCOPY WITH RETROGRADE PYELOGRAM, URETEROSCOPY AND STENT PLACEMENT;  Surgeon: Osborn Blaze, MD;  Location: [facility] ORS;  Service: Urology;  Laterality: Left;   CYSTOSCOPY WITH STENT PLACEMENT Right 07/04/2021   Procedure: Cystoscopy with right retrograde pyelogram and right ureteral stent placement;  Surgeon: Samson Croak, MD;  Location: [facility] OR;  Service: Urology;  Laterality: Right;   CYSTOSCOPY/URETEROSCOPY/HOLMIUM LASER Right 08/10/2021    Procedure: CYSTOSCOPY RIGHT AND STENT PLACEMENT;  Surgeon: Samson Croak, MD;  Location: [facility] ORS;  Service: Urology;  Laterality: Right;   HOLMIUM LASER APPLICATION Left 05/11/2014   Procedure: HOLMIUM LASER APPLICATION;  Surgeon: Osborn Blaze, MD;  Location: [facility] ORS;  Service: Urology;  Laterality: Left;   LARYNGECTOMY     total ( stoma)   LEG AMPUTATION BELOW KNEE Left 09/13/2015   SALIVARY STONE REMOVAL     TONSILLECTOMY     TUBAL LIGATION       Home Medications:  Prior to Admission medications   Medication Sig Start Date End Date Taking? Authorizing Provider  albuterol  (VENTOLIN  HFA) 108 (90 Base) MCG/ACT inhaler Inhale 2 puffs into the lungs every 4 (four) hours as needed for wheezing or shortness of breath. 09/10/22  Yes Vada Garibaldi, MD  apixaban  (ELIQUIS ) 5 MG TABS tablet Take 1 tablet (5 mg total) by mouth 2 (two) times daily. 07/06/21 07/30/23 Yes Pahwani, Martha Slack, MD  atorvastatin  (LIPITOR) 20 MG tablet Take 20  mg by mouth at bedtime. 04/13/20  Yes [provider]  Buprenorphine  HCl-Naloxone  HCl 8-2 MG FILM Place 1 Film under the tongue 3 (three) times daily.    Yes [provider]  cyclobenzaprine  (FLEXERIL ) 10 MG tablet Take 10 mg by mouth at bedtime.   Yes [provider]  diltiazem  (CARDIZEM  CD) 240 MG 24 hr capsule Take 1 capsule (240 mg total) by mouth daily. Patient taking differently: Take 240 mg by mouth at bedtime. 09/10/22 07/30/23 Yes Ghimire, Letty Raya, MD  Glycopyrrolate-Formoterol  (BEVESPI  AEROSPHERE) 9-4.8 MCG/ACT AERO Inhale 2 puffs into the lungs 2 (two) times daily. Patient taking differently: Inhale 2 puffs into the lungs in the morning. 04/28/23  Yes Denson Flake, MD  ibuprofen  (ADVIL ) 800 MG tablet Take 800 mg by mouth every 8 (eight) hours as needed for mild pain. 07/24/21  Yes [provider]  insulin  aspart (NOVOLOG ) 100 UNIT/ML FlexPen Inject 10 Units into the skin 3 (three) times daily with meals. 07/12/20  Yes Oral Billings, MD   insulin  glargine (LANTUS  SOLOSTAR) 100 UNIT/ML Solostar Pen Inject 45 Units into the skin daily. At 10AM 07/12/20  Yes Oral Billings, MD  Magnesium  Hydroxide (DULCOLAX PO) Take 2 tablets by mouth as needed.   Yes [provider]  ondansetron  (ZOFRAN -ODT) 4 MG disintegrating tablet Take 4 mg by mouth every 8 (eight) hours as needed for nausea or vomiting.   Yes [provider]  pregabalin  (LYRICA ) 75 MG capsule Take 75 mg by mouth at bedtime. 07/10/21  Yes [provider]  Insulin  Pen Needle (PEN NEEDLES 3/16") 31G X 5 MM MISC Use as directed. 03/24/14   Krishnan, Gokul, MD    Scheduled Meds:  apixaban   5 mg Oral BID   arformoterol   15 mcg Nebulization BID   And   umeclidinium bromide   1 puff Inhalation Daily   atorvastatin   20 mg Oral Daily   buprenorphine -naloxone   1 tablet Sublingual Q8H   diltiazem   90 mg Oral Q8H   furosemide   40 mg Intravenous BID   insulin  aspart  5 Units Subcutaneous TID WC   insulin  glargine-yfgn  40 Units Subcutaneous Daily   potassium chloride   40 mEq Oral BID   pregabalin   75 mg Oral Q12H   spironolactone  12.5 mg Oral Daily   Continuous Infusions:  PRN Meds: albuterol , ondansetron  (ZOFRAN ) IV  Allergies:    Allergies  Allergen Reactions   Ace Inhibitors Anaphylaxis and Swelling    Angioedema   Zestril [Lisinopril] Anaphylaxis and Swelling    Angioedema    Social History:   Social History   Socioeconomic History   Marital status: Married    Spouse name: Not on file   Number of children: Not on file   Years of education: Not on file   Highest education level: Not on file  Occupational History   Not on file  Tobacco Use   Smoking status: Former    Current packs/day: 0.00    Average packs/day: 1 pack/day for 40.0 years (40.0 ttl pk-yrs)    Types: Cigarettes    Start date: 05/22/1975    Quit date: 05/22/2015    Years since quitting: 8.2   Smokeless tobacco: Never  Vaping Use   Vaping status: Never Used   Substance and Sexual Activity   Alcohol use: No   Drug use: No   Sexual activity: Not on file  Other Topics Concern   Not on file  Social History Narrative   Not  on file   Social Drivers of Health   Financial Resource Strain: Not on File (06/14/2021)   [facility] [employer] [facility]   [employer]    Financial Resource Strain: 0  Food Insecurity: Food Insecurity Present (07/31/2023)   Hunger Vital Sign    Worried About Running Out of Food in the Last Year: Sometimes true    Ran Out of Food in the Last Year: Sometimes true  Transportation Needs: Unmet Transportation Needs (07/31/2023)   PRAPARE - Administrator, Civil Service (Medical): Yes    Lack of Transportation (Non-Medical): Yes  Physical Activity: Not on File (06/14/2021)   Received from [place], [place]   Physical Activity    Physical Activity: 0  Stress: Not on File (06/14/2021)   Received from [facility], [place]   Stress    Stress: 0  Social Connections: Socially Isolated (07/31/2023)   Social Connection and Isolation Panel [NHANES]    Frequency of Communication with Friends and Family: Never    Frequency of Social Gatherings with Friends and Family: Three times a week    Attends Religious Services: Never    Active Member of Clubs or Organizations: No    Attends Banker Meetings: Never    Marital Status: Widowed  Intimate Partner Violence: Not At Risk (08/01/2023)   Humiliation, Afraid, Rape, and Kick questionnaire    Fear of Current or Ex-Partner: No    Emotionally Abused: No    Physically Abused: No    Sexually Abused: No    Family History:   Family History  Adopted: Yes     ROS:  Please see the history of present illness.  All other ROS reviewed and negative.     Physical Exam/Data: Vitals:   08/01/23 0600 08/01/23 0807 08/01/23 1057 08/01/23 1655  BP: 132/86 (!) 150/99 (!) 140/100 (!) 148/115  Pulse: (!) 110 (!) 103  (!) 107  Resp: (!) 22 17 16 13   Temp:  97.7 F (36.5 C) 97.7  F (36.5 C) 97.8 F (36.6 C)  TempSrc:  Oral Oral Oral  SpO2: (!) 87% 96%  91%    Intake/Output Summary (Last 24 hours) at 08/01/2023 1657 Last data filed at 08/01/2023 1331 Gross per 24 hour  Intake 480 ml  Output 3450 ml  Net -2970 ml      09/10/2022    5:39 AM 09/09/2022    4:47 AM 09/08/2022    4:44 AM  Last 3 Weights  Weight (lbs) 255 lb 15.3 oz 255 lb 11.7 oz 257 lb 11.5 oz  Weight (kg) 116.1 kg 116 kg 116.9 kg     There is no height or weight on file to calculate BMI.  General:  Sitting in chair with trache collar and mask in place. In no acute distress HEENT: normal Neck: no JVD, trache collar in place with overlying oxygen mask. Erythema surrounding trache collar extending down central chest Cardiac:  Tachycardiac, irregular, normal S1, S2; no murmur  Lungs:  Wheezing and rhonchi bilaterally with diminished breath sounds Abd: firm but compressible, non- tender  Ext: trace edema Musculoskeletal:  RLE: trace edema to ankle with dressing overlying skin, Left: BKA with hyperpigmentation at stumb without signs of infection Skin: warm and dry, see skin changes above.  Neuro:  CNs 2-12 intact, no focal abnormalities noted Psych:  Normal affect   EKG:  The EKG was personally reviewed and demonstrates:   Initial ECG 07/30/23 at 1314: Atrial Flutter vs Coarse Atrial Fibrillation with variable  block VR 124 ECG 07/31/23:  Atrial Flutter with variable block VR 123 Telemetry:  Telemetry was personally reviewed and demonstrates:Atrial Flutter vs Coarse Atrial Fibrillation VR 100-130s   Relevant CV Studies: Echo 09/09/22 IMPRESSIONS    1. Technically difficult study with poor visualization of intracardiac  structures.   2. Left ventricular ejection fraction, by estimation, is 55 to 60%. The  left ventricle has normal function. The left ventricle demonstrates  regional wall motion abnormalities (see scoring diagram/findings for  description). There is moderate left  ventricular  hypertrophy. Very mild hypokinesis of distal inferoseptal wall  (poor visualization).   3. Right ventricular systolic function is normal. The right ventricular  size is normal.   4. The mitral valve was not well visualized. No evidence of mitral valve  regurgitation. No evidence of mitral stenosis.   5. The aortic valve was not well visualized. Aortic valve regurgitation  is not visualized. No aortic stenosis is present.   6. The inferior vena cava is normal in size with greater than 50%  respiratory variability, suggesting right atrial pressure of 3 mmHg.    Laboratory Data: High Sensitivity Troponin:   Recent Labs  Lab 07/30/23 1303 07/30/23 1514  TROPONINIHS 16 16     Chemistry Recent Labs  Lab 07/30/23 1303 07/30/23 1341 07/31/23 0733 08/01/23 0311  NA 139 140 136 138  K 3.7 3.8 4.4 3.4*  CL 99 101 95* 95*  CO2 28  --  32 32  GLUCOSE 247* 250* 319* 243*  BUN 19 22 23  28*  CREATININE 1.12* 1.10* 1.22* 1.41*  CALCIUM  8.6*  --  8.6* 8.3*  MG  --   --  2.1  --   GFRNONAA 55*  --  49* 41*  ANIONGAP 12  --  9 11    Recent Labs  Lab 07/31/23 0733  PROT 7.2  ALBUMIN  3.7  AST 14*  ALT 19  ALKPHOS 90  BILITOT 0.6   Lipids No results for input(s): "CHOL", "TRIG", "HDL", "LABVLDL", "LDLCALC", "CHOLHDL" in the last 168 hours.  Hematology Recent Labs  Lab 07/30/23 1303 07/30/23 1341 07/31/23 0733  WBC 7.7  --  7.5  RBC 5.20*  --  5.63*  HGB 15.2* 16.0* 16.3*  HCT 47.3* 47.0* 51.5*  MCV 91.0  --  91.5  MCH 29.2  --  29.0  MCHC 32.1  --  31.7  RDW 12.9  --  12.8  PLT 233  --  247   Thyroid  Recent Labs  Lab 07/31/23 0734  TSH 1.674    BNP Recent Labs  Lab 07/30/23 1303  BNP 151.3*    DDimer No results for input(s): "DDIMER" in the last 168 hours.  Radiology/Studies:  ECHOCARDIOGRAM COMPLETE Result Date: 08/01/2023    ECHOCARDIOGRAM REPORT   Patient Name:   DANNAH RYLES [facility] [place] Date of Exam: 08/01/2023 Medical Rec #:  [id number]    Height:       66.0 in  Accession #:    [id number]   Weight:       256.0 lb Date of Birth:  Feb 09, 1956    BSA:          2.221 m Patient Age:    67 years     BP:           150/99 mmHg Patient Gender: F            HR:           116 bpm. Exam Location:  Inpatient Procedure:  2D Echo, Cardiac Doppler, Color Doppler and Intracardiac            Opacification Agent (Both Spectral and Color Flow Doppler were            utilized during procedure). Indications:    CHF  History:        Patient has prior history of Echocardiogram examinations, most                 recent 09/09/2022. Risk Factors:Hypertension and Diabetes.  Sonographer:    Janette Medley Referring Phys: [id number] PREETHA JOSEPH IMPRESSIONS  1. Left ventricular ejection fraction, by estimation, is 55 to 60%. The left ventricle has normal function. The left ventricle has no regional wall motion abnormalities. There is mild left ventricular hypertrophy. Left ventricular diastolic parameters are indeterminate.  2. Right ventricular systolic function is normal. The right ventricular size is normal.  3. Left atrial size was moderately dilated.  4. The mitral valve is abnormal. No evidence of mitral valve regurgitation. No evidence of mitral stenosis. Severe mitral annular calcification.  5. The aortic valve is tricuspid. There is mild calcification of the aortic valve. There is mild thickening of the aortic valve. Aortic valve regurgitation is not visualized. Aortic valve sclerosis is present, with no evidence of aortic valve stenosis.  6. Aortic dilatation noted. There is mild dilatation of the ascending aorta, measuring 40 mm.  7. The inferior vena cava is dilated in size with >50% respiratory variability, suggesting right atrial pressure of 8 mmHg. FINDINGS  Left Ventricle: Left ventricular ejection fraction, by estimation, is 55 to 60%. The left ventricle has normal function. The left ventricle has no regional wall motion abnormalities. Strain was performed and the global longitudinal strain is  indeterminate. The left ventricular internal cavity size was normal in size. There is mild left ventricular hypertrophy. Left ventricular diastolic parameters are indeterminate. Right Ventricle: The right ventricular size is normal. No increase in right ventricular wall thickness. Right ventricular systolic function is normal. Left Atrium: Left atrial size was moderately dilated. Right Atrium: Right atrial size was normal in size. Pericardium: There is no evidence of pericardial effusion. Mitral Valve: The mitral valve is abnormal. There is severe thickening of the mitral valve leaflet(s). There is severe calcification of the mitral valve leaflet(s). Severe mitral annular calcification. No evidence of mitral valve regurgitation. No evidence of mitral valve stenosis. Tricuspid Valve: The tricuspid valve is normal in structure. Tricuspid valve regurgitation is not demonstrated. No evidence of tricuspid stenosis. Aortic Valve: The aortic valve is tricuspid. There is mild calcification of the aortic valve. There is mild thickening of the aortic valve. Aortic valve regurgitation is not visualized. Aortic valve sclerosis is present, with no evidence of aortic valve stenosis. Pulmonic Valve: The pulmonic valve was normal in structure. Pulmonic valve regurgitation is trivial. No evidence of pulmonic stenosis. Aorta: Aortic dilatation noted. There is mild dilatation of the ascending aorta, measuring 40 mm. Venous: The inferior vena cava is dilated in size with greater than 50% respiratory variability, suggesting right atrial pressure of 8 mmHg. IAS/Shunts: No atrial level shunt detected by color flow Doppler. Additional Comments: 3D was performed not requiring image post processing on an independent workstation and was indeterminate.  LEFT VENTRICLE PLAX 2D LVIDd:         5.00 cm   Diastology LVIDs:         3.40 cm   LV e' medial:    7.29 cm/s LV PW:  1.10 cm   LV E/e' medial:  19.9 LV IVS:        1.30 cm   LV e'  lateral:   7.18 cm/s LVOT diam:     2.20 cm   LV E/e' lateral: 20.2 LV SV:         60 LV SV Index:   27 LVOT Area:     3.80 cm  RIGHT VENTRICLE             IVC RV S prime:     10.00 cm/s  IVC diam: 2.60 cm TAPSE (M-mode): 2.0 cm LEFT ATRIUM             Index        RIGHT ATRIUM           Index LA diam:        4.80 cm 2.16 cm/m   RA Area:     18.40 cm LA Vol (A2C):   77.7 ml 34.99 ml/m  RA Volume:   46.80 ml  21.07 ml/m LA Vol (A4C):   50.0 ml 22.51 ml/m LA Biplane Vol: 64.0 ml 28.82 ml/m  AORTIC VALVE LVOT Vmax:   90.70 cm/s LVOT Vmean:  59.200 cm/s LVOT VTI:    0.158 m  AORTA Ao Root diam: 3.00 cm Ao Asc diam:  4.00 cm MITRAL VALVE MV Area (PHT): 2.84 cm     SHUNTS MV Decel Time: 267 msec     Systemic VTI:  0.16 m MV E velocity: 145.00 cm/s  Systemic Diam: 2.20 cm Janelle Mediate MD Electronically signed by Janelle Mediate MD Signature Date/Time: 08/01/2023/9:54:58 AM    Final    DG Chest Port 1 View Result Date: 07/30/2023 CLINICAL DATA:  Shortness of breath for the past 2-3 days.  Hypoxia. EXAM: PORTABLE CHEST 1 VIEW COMPARISON:  09/08/2022 FINDINGS: Improved inspiration. Heart size near the upper limit of normal. Prominent pulmonary vasculature and interstitial markings with mild progression. Probable small bilateral pleural effusions. Thoracic spine degenerative changes. IMPRESSION: Mild changes of acute congestive heart failure. Electronically Signed   By: Catherin Closs M.D.   On: 07/30/2023 13:49     Assessment and Plan: Coarse Atrial Fibrillation and Atrial Flutter with RVR History of atrial fibrillation dating back to 2022 was placed on diltiazem  and eliquis , not followed by cardiology. No prior cardioversion in the past.  -- ECGs this admission with atypical atrial flutter and coarse atrial fibrillation with RVR.  -- rates modestly controlled  in the 100-130s on 270 mg total cardizem  PO -- she currently has blood pressure room to titrate cardizem  and add 25 mg lopressor  BID  -- if rate  controlled with addition of lopressor , plan to consolidate to CD cardizem  prior to discharge -- rate control may be difficult given evidence of atypical flutter -- rhythm control is complicated by her noncompliance with eliquis  (previously noncompliant with eliquis  largely due to misunderstanding with NSAID use) -- she is willing to be compliant with eliquis  and avoid NSAIDs -- given trach in place, will optimize rate control with PO medications, continue with diuresis, and anticoagulate, hoping to avoid need for TEE  -- plan to discharge with eliquis  5 mg BID and plan for 3-4 weeks of anticoagulation - if she remains in fib/flutter, will plan OP DCCV (without TEE)   Acute respiratory failure COPD She is on 6L/min FiO2 of 28% with oxygen saturation between 90-97%.  -- multifactorial given underlying COPD and likely HFpEF exacerbation in the setting of RVR --  per primary    Acute on chronic diastolic heart failure Echo today (07/31/33) showed EF 55-60%. With new valvular disease. (See below.) Noted to have dilated IVC with right atrial pressure 8 mmHg.  -- agree with spironolactone 12.5 mg  -- has diuresed on IV lasix  40 mg BID - given rising creatinine will hold evening IV lasix   --SGLT2i should be avoided given patient's history of BKA and UTI induced sepsis with hydronephrosis resulting in left kidney atrophy.  -- GDMT can be optimized after rate control achieved, may be able to titrate if she can restore sinus rhythm - would benefit from ACEI/ARB -- plan to consolidate lopressor  after SR restored -- plan to resume IV lasix  40 mg tomorrow morning and follow renal function and clinical improvement   Severe mitral calcifications Aortic valve sclerosis -- I do not see a recent lipid panel -- given calcification seen on valves, consider increasing lipitor to 40 mg -- follow clinically    CKD (baseline ~1.2 with atrophy of left kidney) On admission Cr was 1.12, since beginning diuresis  her Cr has increased to 1.41. She has appropriate urine response, and she reports her volume status feels improved though continues to have some abdominal distention.   -- she remains mildly volume up -- adjust lasix  as above, will resume IV lasix  tomorrow morning   Chronic pain -- long discussion with the patient regarding importance of compliance with eliquis  -- recommend discussing other options for pain management, advise avoiding NSAID use in the setting of eliquis    7. PAD s/p left BKA -continue to optimize A1c   Per primary  COPD  T2DM   I will arrange cardiology follow up in 2 weeks. If still in Afib/flutter, and compliance with eliquis  is confirmed, will plan for OP DCCV.   Risk Assessment/Risk Scores:      [place]  Heart Association (NYHA) Functional Class NYHA Class III  CHA2DS2-VASc Score = 6   This indicates a 9.7% annual risk of stroke. The patient's score is based upon: CHF History: 1 HTN History: 1 Diabetes History: 1 Stroke History: 0 Vascular Disease History: 1 Age Score: 1 Gender Score: 1     For questions or updates, please contact [facility] Please consult [email or web address] for contact info under    Signed, Lamond Pilot, PA  08/01/2023 4:57 PM

## 2023-08-01 NOTE — Inpatient Diabetes Management (Signed)
 Inpatient Diabetes Program Recommendations  AACE/ADA: New Consensus Statement on Inpatient Glycemic Control (2015)  Target Ranges:  Prepandial:   less than 140 mg/dL      Peak postprandial:   less than 180 mg/dL (1-2 hours)      Critically ill patients:  140 - 180 mg/dL   Lab Results  Component Value Date   GLUCAP 158 (H) 08/01/2023   HGBA1C 7.9 (H) 07/31/2023    Review of Glycemic Control  Latest Reference Range & Units 07/31/23 12:05 07/31/23 16:16 08/01/23 06:37  Glucose-Capillary 70 - 99 mg/dL 644 (H) 034 (H) 742 (H)   Diabetes history: DM 2 Outpatient Diabetes medications:  Novolog  10 units tid with meals  Lantus  45 units daily Current orders for Inpatient glycemic control:  Novolog  5 units tid with meals Semglee  40 units daily  Inpatient Diabetes Program Recommendations:  May consider adding Novolog  moderate (0-15 units) tid with meals and HS.   Thanks,  Josefa Ni, RN, BC-ADM Inpatient Diabetes Coordinator Pager [phone]  (8a-5p)

## 2023-08-02 DIAGNOSIS — I5033 Acute on chronic diastolic (congestive) heart failure: Secondary | ICD-10-CM | POA: Diagnosis not present

## 2023-08-02 DIAGNOSIS — I480 Paroxysmal atrial fibrillation: Secondary | ICD-10-CM | POA: Diagnosis not present

## 2023-08-02 LAB — BASIC METABOLIC PANEL WITH GFR
Anion gap: 10 (ref 5–15)
BUN: 35 mg/dL — ABNORMAL HIGH (ref 8–23)
CO2: 35 mmol/L — ABNORMAL HIGH (ref 22–32)
Calcium: 9 mg/dL (ref 8.9–10.3)
Chloride: 93 mmol/L — ABNORMAL LOW (ref 98–111)
Creatinine, Ser: 1.49 mg/dL — ABNORMAL HIGH (ref 0.44–1.00)
GFR, Estimated: 39 mL/min — ABNORMAL LOW (ref >60)
Glucose, Bld: 226 mg/dL — ABNORMAL HIGH (ref 70–99)
Potassium: 4.1 mmol/L (ref 3.5–5.1)
Sodium: 138 mmol/L (ref 135–145)

## 2023-08-02 LAB — GLUCOSE, CAPILLARY
Glucose-Capillary: 178 mg/dL — ABNORMAL HIGH (ref 70–99)
Glucose-Capillary: 164 mg/dL — ABNORMAL HIGH (ref 70–99)
Glucose-Capillary: 205 mg/dL — ABNORMAL HIGH (ref 70–99)

## 2023-08-02 MED ORDER — REVEFENACIN 175 MCG/3ML IN SOLN
175.0000 ug | Freq: Every day | RESPIRATORY_TRACT | Status: DC
  Administered 2023-08-02 – 2023-08-04 (×3): 175 ug via RESPIRATORY_TRACT
  Filled 2023-08-02 (×3): qty 3

## 2023-08-02 MED ORDER — FUROSEMIDE 40 MG PO TABS
40.0000 mg | ORAL_TABLET | Freq: Every day | ORAL | Status: DC
  Administered 2023-08-02: 40 mg via ORAL
  Filled 2023-08-02: qty 1

## 2023-08-02 NOTE — Progress Notes (Signed)
 Cardiologist:  Hilty  Subjective:  Denies SSCP, palpitations or Dyspnea Eating breakfast  Objective:  Vitals:   08/02/23 0000 08/02/23 0015 08/02/23 0030 08/02/23 0300  BP:  106/78 115/73   Pulse: 90 81 86   Resp: 12 15 14    Temp:      TempSrc:      SpO2: 91% 92% 93% 91%    Intake/Output from previous day:  Intake/Output Summary (Last 24 hours) at 08/02/2023 0729 Last data filed at 08/01/2023 2300 Gross per 24 hour  Intake 1017 ml  Output 2550 ml  Net -1533 ml    Physical Exam:  Obese female Chronic trach Lungs clear No murmur  Post left BKA  Lab Results: Basic Metabolic Panel: Recent Labs    07/31/23 0733 08/01/23 0311 08/02/23 0214  NA 136 138 138  K 4.4 3.4* 4.1  CL 95* 95* 93*  CO2 32 32 35*  GLUCOSE 319* 243* 226*  BUN 23 28* 35*  CREATININE 1.22* 1.41* 1.49*  CALCIUM  8.6* 8.3* 9.0  MG 2.1  --   --    Liver Function Tests: Recent Labs    07/31/23 0733  AST 14*  ALT 19  ALKPHOS 90  BILITOT 0.6  PROT 7.2  ALBUMIN  3.7   No results for input(s): "LIPASE", "AMYLASE" in the last 72 hours. CBC: Recent Labs    07/30/23 1303 07/30/23 1341 07/31/23 0733  WBC 7.7  --  7.5  NEUTROABS 5.4  --   --   HGB 15.2* 16.0* 16.3*  HCT 47.3* 47.0* 51.5*  MCV 91.0  --  91.5  PLT 233  --  247    Hemoglobin A1C: Recent Labs    07/31/23 0824  HGBA1C 7.9*    Thyroid Function Tests: Recent Labs    07/31/23 0734  TSH 1.674     Imaging: ECHOCARDIOGRAM COMPLETE Result Date: 08/01/2023    ECHOCARDIOGRAM REPORT   Patient Name:   ALVIRA HECHT Date of Exam: 08/01/2023 Medical Rec #:  [id number]    Height:       66.0 in Accession #:    [id number]   Weight:       256.0 lb Date of Birth:  10/10/1955    BSA:          2.221 m Patient Age:    68 years     BP:           150/99 mmHg Patient Gender: F            HR:           116 bpm. Exam Location:  Inpatient Procedure: 2D Echo, Cardiac Doppler, Color Doppler and Intracardiac            Opacification Agent (Both  Spectral and Color Flow Doppler were            utilized during procedure). Indications:    CHF  History:        Patient has prior history of Echocardiogram examinations, most                 recent 09/09/2022. Risk Factors:Hypertension and Diabetes.  Sonographer:    Janette Medley Referring Phys: [id number] PREETHA JOSEPH IMPRESSIONS  1. Left ventricular ejection fraction, by estimation, is 55 to 60%. The left ventricle has normal function. The left ventricle has no regional wall motion abnormalities. There is mild left ventricular hypertrophy. Left ventricular diastolic parameters are indeterminate.  2. Right ventricular systolic function is normal. The right ventricular  size is normal.  3. Left atrial size was moderately dilated.  4. The mitral valve is abnormal. No evidence of mitral valve regurgitation. No evidence of mitral stenosis. Severe mitral annular calcification.  5. The aortic valve is tricuspid. There is mild calcification of the aortic valve. There is mild thickening of the aortic valve. Aortic valve regurgitation is not visualized. Aortic valve sclerosis is present, with no evidence of aortic valve stenosis.  6. Aortic dilatation noted. There is mild dilatation of the ascending aorta, measuring 40 mm.  7. The inferior vena cava is dilated in size with >50% respiratory variability, suggesting right atrial pressure of 8 mmHg. FINDINGS  Left Ventricle: Left ventricular ejection fraction, by estimation, is 55 to 60%. The left ventricle has normal function. The left ventricle has no regional wall motion abnormalities. Strain was performed and the global longitudinal strain is indeterminate. The left ventricular internal cavity size was normal in size. There is mild left ventricular hypertrophy. Left ventricular diastolic parameters are indeterminate. Right Ventricle: The right ventricular size is normal. No increase in right ventricular wall thickness. Right ventricular systolic function is normal. Left Atrium:  Left atrial size was moderately dilated. Right Atrium: Right atrial size was normal in size. Pericardium: There is no evidence of pericardial effusion. Mitral Valve: The mitral valve is abnormal. There is severe thickening of the mitral valve leaflet(s). There is severe calcification of the mitral valve leaflet(s). Severe mitral annular calcification. No evidence of mitral valve regurgitation. No evidence of mitral valve stenosis. Tricuspid Valve: The tricuspid valve is normal in structure. Tricuspid valve regurgitation is not demonstrated. No evidence of tricuspid stenosis. Aortic Valve: The aortic valve is tricuspid. There is mild calcification of the aortic valve. There is mild thickening of the aortic valve. Aortic valve regurgitation is not visualized. Aortic valve sclerosis is present, with no evidence of aortic valve stenosis. Pulmonic Valve: The pulmonic valve was normal in structure. Pulmonic valve regurgitation is trivial. No evidence of pulmonic stenosis. Aorta: Aortic dilatation noted. There is mild dilatation of the ascending aorta, measuring 40 mm. Venous: The inferior vena cava is dilated in size with greater than 50% respiratory variability, suggesting right atrial pressure of 8 mmHg. IAS/Shunts: No atrial level shunt detected by color flow Doppler. Additional Comments: 3D was performed not requiring image post processing on an independent workstation and was indeterminate.  LEFT VENTRICLE PLAX 2D LVIDd:         5.00 cm   Diastology LVIDs:         3.40 cm   LV e' medial:    7.29 cm/s LV PW:         1.10 cm   LV E/e' medial:  19.9 LV IVS:        1.30 cm   LV e' lateral:   7.18 cm/s LVOT diam:     2.20 cm   LV E/e' lateral: 20.2 LV SV:         60 LV SV Index:   27 LVOT Area:     3.80 cm  RIGHT VENTRICLE             IVC RV S prime:     10.00 cm/s  IVC diam: 2.60 cm TAPSE (M-mode): 2.0 cm LEFT ATRIUM             Index        RIGHT ATRIUM           Index LA diam:  4.80 cm 2.16 cm/m   RA Area:      18.40 cm LA Vol (A2C):   77.7 ml 34.99 ml/m  RA Volume:   46.80 ml  21.07 ml/m LA Vol (A4C):   50.0 ml 22.51 ml/m LA Biplane Vol: 64.0 ml 28.82 ml/m  AORTIC VALVE LVOT Vmax:   90.70 cm/s LVOT Vmean:  59.200 cm/s LVOT VTI:    0.158 m  AORTA Ao Root diam: 3.00 cm Ao Asc diam:  4.00 cm MITRAL VALVE MV Area (PHT): 2.84 cm     SHUNTS MV Decel Time: 267 msec     Systemic VTI:  0.16 m MV E velocity: 145.00 cm/s  Systemic Diam: 2.20 cm Janelle Mediate MD Electronically signed by Janelle Mediate MD Signature Date/Time: 08/01/2023/9:54:58 AM    Final     Cardiac Studies:  ECG:  Orders placed or performed during the hospital encounter of 07/30/23   ED EKG   ED EKG   EKG 12-Lead   EKG 12-Lead   Repeat EKG   Repeat EKG   EKG 12-Lead   EKG 12-Lead   EKG 12-Lead   EKG 12-Lead   EKG 12-Lead   EKG 12-Lead   EKG   EKG   EKG   EKG     Telemetry:  afib rates 90   Echo: EF 55-60% mod LAE AV sclerosis 08/01/23  Medications:    apixaban   5 mg Oral BID   arformoterol   15 mcg Nebulization BID   And   umeclidinium bromide   1 puff Inhalation Daily   atorvastatin   40 mg Oral Daily   buprenorphine -naloxone   1 tablet Sublingual Q8H   diltiazem   90 mg Oral Q8H   furosemide   40 mg Intravenous Daily   insulin  aspart  5 Units Subcutaneous TID WC   insulin  glargine-yfgn  40 Units Subcutaneous Daily   metoprolol  tartrate  25 mg Oral BID   pregabalin   75 mg Oral Q12H   spironolactone   12.5 mg Oral Daily      Assessment/Plan:   Afib:  rates better controlled on cardizem  90 mg q 8 and lopressor  25 mg bid. Moderate LAE on echo. On eliquis  5 mg bid Plan outpatient f/u 3-4 weeks and possible DCC. Not a candidate for TEE/DCC given chronic trach and airway issues Diastolic CHF:  improved with good diuresis contnue lasix  daily change to PO and aldactone . CXR on admission mild CHF EF normal on TTE BNP only 151   Janelle Mediate 08/02/2023, 7:29 AM

## 2023-08-02 NOTE — Progress Notes (Signed)
 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  She reports chronic orthopnea at baseline.  Mild chronic cough is unchanged, intermittent wheezing reported as well, denies fevers chills or increased secretions at this time.  Previously was on Lasix  but has not taken this for years ED Course: Noted to be in A-fib with RVR, volume overloaded, mildly hypoxic placed on 2 L O2 via trach collar, labs noted creatinine 1.1, glucose 247, BNP 151, troponin 16, hemoglobin 15. - Chest x-ray with pulmonary vascular congestion and CHF changes, EKG noted A-fib RVR, given IV Lasix  and started on Cardizem  gtt.   Subjective: - Feels better overall, breathing continues to improve, still on O2 via trach collar  Assessment and Plan:   Acute on chronic diastolic CHF Acute hypoxic respiratory failure -2D echo noted preserved EF, indeterminate diastolic function, normal RV - Improving with diuresis, 4.4 L negative -Appears close to euvolemic, could switch to oral Lasix , continue Aldactone  -poor candidate for SGLT2i -Wean O2 as tolerated -Would benefit from rhythm control   Paroxysmal A-fib with RVR - Heart rate remains suboptimal, on oral Cardizem  and started Lopressor  - She is poorly compliant with Eliquis  at baseline -Cards following, poor candidate for TEE/DCCV considering chronic trach - TSH is normal   Chronic tracheostomy -Routine trach care     Chronic pain syndrome -Home regimen of narcotics continued     Peripheral vascular disease (HCC) Left BKA   COPD, former  smoker -Continue home regimen of albuterol  as needed and ICS/LABA   Type 2 diabetes mellitus with hyperglycemia - CBGs improving, continue current dose of glargine and meal coverage, A1c is 7.9   DVT prophylaxis: Eliquis  Code Status: DNR per patient wishes Family Communication: Son at bedside Disposition Plan: Home like 1 to 2 days  Objective: Vitals:   08/02/23 0030 08/02/23 0300 08/02/23 0813 08/02/23 0815  BP: 115/73   (!) 117/93  Pulse: 86  87   Resp: 14     Temp:   98.2 F (36.8 C)   TempSrc:      SpO2: 93% 91% 90%     Intake/Output Summary (Last 24 hours) at 08/02/2023 1047 Last data filed at 08/01/2023 2300 Gross per 24 hour  Intake 777 ml  Output 1800 ml  Net -1023 ml   There were no vitals filed for this visit.  Examination:  General exam: Obese pleasant female sitting up in bed, AAO x 3 H he T: Tracheostomy with trach collar Respiratory system: Few scattered rhonchi, Cardiovascular system: S1 & S2 heard, irregular rhythm Abd: nondistended, soft and nontender.Normal bowel sounds heard. Central nervous system: Alert and oriented. No focal neurological deficits. Extremities: No edema, left BKA Skin: No rashes Psychiatry:  Mood & affect appropriate.     Data Reviewed:   CBC: Recent Labs  Lab 07/30/23 1303 07/30/23 1341 07/31/23 0733  WBC 7.7  --  7.5  NEUTROABS 5.4  --   --   HGB 15.2* 16.0* 16.3*  HCT 47.3* 47.0* 51.5*  MCV 91.0  --  91.5  PLT 233  --  247   Basic Metabolic Panel: Recent Labs  Lab 07/30/23 1303 07/30/23 1341 07/31/23 0733 08/01/23 0311 08/02/23 0214  NA 139 140 136 138 138  K 3.7 3.8 4.4 3.4* 4.1  CL 99 101 95* 95* 93*  CO2 28  --  32 32 35*  GLUCOSE 247* 250* 319* 243* 226*  BUN 19 22 23  28* 35*  CREATININE 1.12* 1.10* 1.22* 1.41* 1.49*  CALCIUM  8.6*  --  8.6* 8.3* 9.0  MG  --   --  2.1  --   --    GFR: CrCl cannot be calculated (Unknown ideal weight.). Liver Function Tests: Recent Labs  Lab 07/31/23 0733  AST  14*  ALT 19  ALKPHOS 90  BILITOT 0.6  PROT 7.2  ALBUMIN  3.7   No results for input(s): "LIPASE", "AMYLASE" in the last 168 hours. No results for input(s): "AMMONIA" in the last 168 hours. Coagulation Profile: No results for input(s): "INR", "PROTIME" in the last 168 hours. Cardiac Enzymes: No results for input(s): "CKTOTAL", "CKMB", "CKMBINDEX", "TROPONINI" in the last 168 hours. BNP (last 3 results) No results for input(s): "PROBNP" in the last 8760 hours. HbA1C: Recent Labs    07/31/23 0824  HGBA1C 7.9*   CBG: Recent Labs  Lab 07/31/23 1616 08/01/23 0637 08/01/23 1040 08/01/23 1654 08/02/23 0653  GLUCAP 170* 158* 161* 130* 178*   Lipid Profile: No results for input(s): "CHOL", "HDL", "LDLCALC", "TRIG", "CHOLHDL", "LDLDIRECT" in the last 72 hours. Thyroid Function Tests: Recent Labs    07/31/23 0734  TSH 1.674   Anemia Panel: No results for input(s): "VITAMINB12", "FOLATE", "FERRITIN", "TIBC", "IRON", "RETICCTPCT" in the last 72 hours. Urine analysis:    Component Value Date/Time   COLORURINE YELLOW 07/03/2021 2032   APPEARANCEUR HAZY (A) 07/03/2021 2032   LABSPEC 1.015 07/03/2021 2032   PHURINE 8.0 07/03/2021 2032   GLUCOSEU NEGATIVE 07/03/2021 2032   HGBUR SMALL (A) 07/03/2021 2032   BILIRUBINUR NEGATIVE 07/03/2021 2032   KETONESUR NEGATIVE 07/03/2021 2032   PROTEINUR 100 (A) 07/03/2021 2032   UROBILINOGEN 0.2 03/20/2014 0237   NITRITE POSITIVE (A) 07/03/2021 2032   LEUKOCYTESUR LARGE (A) 07/03/2021 2032   Sepsis Labs: @LABRCNTIP (procalcitonin:4,lacticidven:4)  ) Recent Results (from the past 240 hours)  MRSA Next Gen by PCR, Nasal     Status: None   Collection Time: 07/31/23  3:02 PM   Specimen: Nasal Mucosa; Nasal Swab  Result Value Ref Range Status   MRSA by PCR Next Gen NOT DETECTED NOT DETECTED Final    Comment: (NOTE) The GeneXpert MRSA Assay (FDA approved for NASAL specimens only), is one component of a comprehensive MRSA colonization  surveillance program. It is not intended to diagnose MRSA infection nor to guide or monitor treatment for MRSA infections. Test performance is not FDA approved in patients less than 63 years old. Performed at [facility], [address] [place], [place] [address]      Radiology Studies: ECHOCARDIOGRAM COMPLETE Result Date: 08/01/2023    ECHOCARDIOGRAM REPORT   Patient Name:   BYANKA LANDRUS Date of Exam: 08/01/2023 Medical Rec #:  [id number]    Height:       66.0 in Accession #:    [id number]   Weight:       256.0 lb Date of Birth:  1955-06-19    BSA:          2.221 m Patient Age:    67 years     BP:           150/99 mmHg Patient Gender: F  HR:           116 bpm. Exam Location:  Inpatient Procedure: 2D Echo, Cardiac Doppler, Color Doppler and Intracardiac            Opacification Agent (Both Spectral and Color Flow Doppler were            utilized during procedure). Indications:    CHF  History:        Patient has prior history of Echocardiogram examinations, most                 recent 09/09/2022. Risk Factors:Hypertension and Diabetes.  Sonographer:    Janette Medley Referring Phys: [id number] Waldron Gerry IMPRESSIONS  1. Left ventricular ejection fraction, by estimation, is 55 to 60%. The left ventricle has normal function. The left ventricle has no regional wall motion abnormalities. There is mild left ventricular hypertrophy. Left ventricular diastolic parameters are indeterminate.  2. Right ventricular systolic function is normal. The right ventricular size is normal.  3. Left atrial size was moderately dilated.  4. The mitral valve is abnormal. No evidence of mitral valve regurgitation. No evidence of mitral stenosis. Severe mitral annular calcification.  5. The aortic valve is tricuspid. There is mild calcification of the aortic valve. There is mild thickening of the aortic valve. Aortic valve regurgitation is not visualized. Aortic valve sclerosis is present, with no evidence of aortic valve  stenosis.  6. Aortic dilatation noted. There is mild dilatation of the ascending aorta, measuring 40 mm.  7. The inferior vena cava is dilated in size with >50% respiratory variability, suggesting right atrial pressure of 8 mmHg. FINDINGS  Left Ventricle: Left ventricular ejection fraction, by estimation, is 55 to 60%. The left ventricle has normal function. The left ventricle has no regional wall motion abnormalities. Strain was performed and the global longitudinal strain is indeterminate. The left ventricular internal cavity size was normal in size. There is mild left ventricular hypertrophy. Left ventricular diastolic parameters are indeterminate. Right Ventricle: The right ventricular size is normal. No increase in right ventricular wall thickness. Right ventricular systolic function is normal. Left Atrium: Left atrial size was moderately dilated. Right Atrium: Right atrial size was normal in size. Pericardium: There is no evidence of pericardial effusion. Mitral Valve: The mitral valve is abnormal. There is severe thickening of the mitral valve leaflet(s). There is severe calcification of the mitral valve leaflet(s). Severe mitral annular calcification. No evidence of mitral valve regurgitation. No evidence of mitral valve stenosis. Tricuspid Valve: The tricuspid valve is normal in structure. Tricuspid valve regurgitation is not demonstrated. No evidence of tricuspid stenosis. Aortic Valve: The aortic valve is tricuspid. There is mild calcification of the aortic valve. There is mild thickening of the aortic valve. Aortic valve regurgitation is not visualized. Aortic valve sclerosis is present, with no evidence of aortic valve stenosis. Pulmonic Valve: The pulmonic valve was normal in structure. Pulmonic valve regurgitation is trivial. No evidence of pulmonic stenosis. Aorta: Aortic dilatation noted. There is mild dilatation of the ascending aorta, measuring 40 mm. Venous: The inferior vena cava is dilated in  size with greater than 50% respiratory variability, suggesting right atrial pressure of 8 mmHg. IAS/Shunts: No atrial level shunt detected by color flow Doppler. Additional Comments: 3D was performed not requiring image post processing on an independent workstation and was indeterminate.  LEFT VENTRICLE PLAX 2D LVIDd:         5.00 cm   Diastology LVIDs:  3.40 cm   LV e' medial:    7.29 cm/s LV PW:         1.10 cm   LV E/e' medial:  19.9 LV IVS:        1.30 cm   LV e' lateral:   7.18 cm/s LVOT diam:     2.20 cm   LV E/e' lateral: 20.2 LV SV:         60 LV SV Index:   27 LVOT Area:     3.80 cm  RIGHT VENTRICLE             IVC RV S prime:     10.00 cm/s  IVC diam: 2.60 cm TAPSE (M-mode): 2.0 cm LEFT ATRIUM             Index        RIGHT ATRIUM           Index LA diam:        4.80 cm 2.16 cm/m   RA Area:     18.40 cm LA Vol (A2C):   77.7 ml 34.99 ml/m  RA Volume:   46.80 ml  21.07 ml/m LA Vol (A4C):   50.0 ml 22.51 ml/m LA Biplane Vol: 64.0 ml 28.82 ml/m  AORTIC VALVE LVOT Vmax:   90.70 cm/s LVOT Vmean:  59.200 cm/s LVOT VTI:    0.158 m  AORTA Ao Root diam: 3.00 cm Ao Asc diam:  4.00 cm MITRAL VALVE MV Area (PHT): 2.84 cm     SHUNTS MV Decel Time: 267 msec     Systemic VTI:  0.16 m MV E velocity: 145.00 cm/s  Systemic Diam: 2.20 cm Janelle Mediate MD Electronically signed by Janelle Mediate MD Signature Date/Time: 08/01/2023/9:54:58 AM    Final      Scheduled Meds:  apixaban   5 mg Oral BID   arformoterol   15 mcg Nebulization BID   atorvastatin   40 mg Oral Daily   buprenorphine -naloxone   1 tablet Sublingual Q8H   diltiazem   90 mg Oral Q8H   furosemide   40 mg Oral Daily   insulin  aspart  5 Units Subcutaneous TID WC   insulin  glargine-yfgn  40 Units Subcutaneous Daily   metoprolol  tartrate  25 mg Oral BID   pregabalin   75 mg Oral Q12H   revefenacin  175 mcg Nebulization Daily   spironolactone   12.5 mg Oral Daily   Continuous Infusions:     LOS: 3 days    Time spent:    Deforest Fast, MD [facility]   08/02/2023, 10:47 AM

## 2023-08-02 NOTE — Plan of Care (Signed)
  Problem: Education: Goal: Knowledge of General Education information will improve Description: Including pain rating scale, medication(s)/side effects and non-pharmacologic comfort measures 08/02/2023 0630 by Jerri Morale, RN Outcome: Progressing 08/02/2023 0630 by Jerri Morale, RN Outcome: Progressing   Problem: Clinical Measurements: Goal: Ability to maintain clinical measurements within normal limits will improve Outcome: Progressing   Problem: Skin Integrity: Goal: Risk for impaired skin integrity will decrease Outcome: Progressing

## 2023-08-02 NOTE — Plan of Care (Signed)

## 2023-08-03 ENCOUNTER — Inpatient Hospital Stay (HOSPITAL_COMMUNITY)

## 2023-08-03 ENCOUNTER — Other Ambulatory Visit: Payer: Self-pay

## 2023-08-03 DIAGNOSIS — I4819 Other persistent atrial fibrillation: Secondary | ICD-10-CM | POA: Diagnosis not present

## 2023-08-03 DIAGNOSIS — I5033 Acute on chronic diastolic (congestive) heart failure: Secondary | ICD-10-CM | POA: Diagnosis not present

## 2023-08-03 LAB — BASIC METABOLIC PANEL WITH GFR
Anion gap: 10 (ref 5–15)
BUN: 34 mg/dL — ABNORMAL HIGH (ref 8–23)
CO2: 36 mmol/L — ABNORMAL HIGH (ref 22–32)
Calcium: 9 mg/dL (ref 8.9–10.3)
Chloride: 93 mmol/L — ABNORMAL LOW (ref 98–111)
Creatinine, Ser: 1.29 mg/dL — ABNORMAL HIGH (ref 0.44–1.00)
GFR, Estimated: 46 mL/min — ABNORMAL LOW (ref >60)
Glucose, Bld: 229 mg/dL — ABNORMAL HIGH (ref 70–99)
Potassium: 4.1 mmol/L (ref 3.5–5.1)
Sodium: 139 mmol/L (ref 135–145)

## 2023-08-03 LAB — GLUCOSE, CAPILLARY
Glucose-Capillary: 177 mg/dL — ABNORMAL HIGH (ref 70–99)
Glucose-Capillary: 174 mg/dL — ABNORMAL HIGH (ref 70–99)
Glucose-Capillary: 158 mg/dL — ABNORMAL HIGH (ref 70–99)

## 2023-08-03 MED ORDER — PREGABALIN 50 MG PO CAPS: 50.0000 mg | ORAL_CAPSULE | Freq: Every day | ORAL | Status: DC

## 2023-08-03 MED ORDER — FUROSEMIDE 40 MG PO TABS
40.0000 mg | ORAL_TABLET | Freq: Two times a day (BID) | ORAL | Status: DC
  Administered 2023-08-03 – 2023-08-04 (×2): 40 mg via ORAL
  Filled 2023-08-03 (×2): qty 1

## 2023-08-03 MED ORDER — FUROSEMIDE 10 MG/ML IJ SOLN
40.0000 mg | Freq: Two times a day (BID) | INTRAMUSCULAR | Status: DC
  Administered 2023-08-03: 40 mg via INTRAVENOUS
  Filled 2023-08-03: qty 4

## 2023-08-03 MED ORDER — BISACODYL 10 MG RE SUPP
10.0000 mg | Freq: Once | RECTAL | Status: AC
  Administered 2023-08-03: 10 mg via RECTAL
  Filled 2023-08-03: qty 1

## 2023-08-03 MED ORDER — BISACODYL 5 MG PO TBEC
10.0000 mg | DELAYED_RELEASE_TABLET | Freq: Every day | ORAL | Status: DC | PRN
  Administered 2023-08-03: 10 mg via ORAL
  Filled 2023-08-03: qty 2

## 2023-08-03 MED ORDER — INSULIN GLARGINE-YFGN 100 UNIT/ML ~~LOC~~ SOLN
45.0000 [IU] | Freq: Every day | SUBCUTANEOUS | Status: DC
  Administered 2023-08-03: 45 [IU] via SUBCUTANEOUS
  Filled 2023-08-03 (×2): qty 0.45

## 2023-08-03 MED ORDER — INSULIN ASPART 100 UNIT/ML IJ SOLN
6.0000 [IU] | Freq: Three times a day (TID) | INTRAMUSCULAR | Status: DC
  Administered 2023-08-03 – 2023-08-04 (×5): 6 [IU] via SUBCUTANEOUS

## 2023-08-03 NOTE — Progress Notes (Signed)
 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  She reports chronic orthopnea at baseline.  Mild chronic cough is unchanged, intermittent wheezing reported as well, denies fevers chills or increased secretions at this time.  Previously was on Lasix  but has not taken this for years ED Course: Noted to be in A-fib with RVR, volume overloaded, mildly hypoxic placed on 2 L O2 via trach collar, labs noted creatinine 1.1, glucose 247, BNP 151, troponin 16, hemoglobin 15. - Chest x-ray with pulmonary vascular congestion and CHF changes, EKG noted A-fib RVR, given IV Lasix  and started on Cardizem  gtt.   Subjective: - Feels better overall, breathing continues to improve, still on O2 via trach collar  Assessment and Plan:   Acute on chronic diastolic CHF Acute hypoxic respiratory failure -2D echo noted preserved EF, indeterminate diastolic function, normal RV - Improving with diuresis, 5.7 L negative -Still remains moderately hypoxic on 5 L trach collar, repeat x-ray with some CHF -Repeat IV Lasix  X2 doses today, continue Aldactone  -poor candidate for SGLT2i -Wean O2 as tolerated -Would benefit from rhythm control   Paroxysmal A-fib with RVR - Heart rate little better on Cardizem  and Lopressor  - She is poorly compliant with Eliquis  at baseline -Plan for outpatient follow-up in 3 to 4 weeks, possible DCCV -Cards following, poor candidate for TEE/DCCV considering chronic trach - TSH is normal   Chronic tracheostomy -Routine trach care     Chronic pain syndrome -Home regimen  of narcotics continued     Peripheral vascular disease (HCC) Left BKA   COPD, former smoker -Continue home regimen of albuterol  as needed and ICS/LABA   Type 2 diabetes mellitus with hyperglycemia - CBGs elevated, increase dose of insulin  and meal coverage, A1c is 7.9   DVT prophylaxis: Eliquis  Code Status: DNR per patient wishes Family Communication: Son at bedside yesterday Disposition Plan: Home like 1 to 2 days  Objective: Vitals:   08/03/23 0420 08/03/23 0616 08/03/23 0747 08/03/23 0831  BP:  (!) 112/90 97/68   Pulse:      Resp: 11     Temp:   98.6 F (37 C)   TempSrc:   Oral   SpO2:    94%    Intake/Output Summary (Last 24 hours) at 08/03/2023 1029 Last data filed at 08/03/2023 0747 Gross per 24 hour  Intake 720 ml  Output 2600 ml  Net -1880 ml   There were no vitals filed for this visit.  Examination:  General exam: Obese pleasant female sitting up in bed, AAO x 3 HEENT: Tracheostomy with trach collar CVS: S1-S2, irregular rhythm Lungs: Few scattered rhonchi otherwise clear Abdomen: Soft, nontender, bowel sounds present Extremities: Trace edema, left BKA  Skin: No rashes Psychiatry:  Mood & affect appropriate.     Data Reviewed:   CBC: Recent Labs  Lab 07/30/23 1303 07/30/23 1341 07/31/23 0733  WBC 7.7  --  7.5  NEUTROABS 5.4  --   --   HGB 15.2* 16.0* 16.3*  HCT 47.3* 47.0* 51.5*  MCV 91.0  --  91.5  PLT 233  --  247   Basic  Metabolic Panel: Recent Labs  Lab 07/30/23 1303 07/30/23 1341 07/31/23 0733 08/01/23 0311 08/02/23 0214 08/03/23 0224  NA 139 140 136 138 138 139  K 3.7 3.8 4.4 3.4* 4.1 4.1  CL 99 101 95* 95* 93* 93*  CO2 28  --  32 32 35* 36*  GLUCOSE 247* 250* 319* 243* 226* 229*  BUN 19 22 23  28* 35* 34*  CREATININE 1.12* 1.10* 1.22* 1.41* 1.49* 1.29*  CALCIUM  8.6*  --  8.6* 8.3* 9.0 9.0  MG  --   --  2.1  --   --   --    GFR: CrCl cannot be calculated (Unknown ideal weight.). Liver Function Tests: Recent Labs  Lab  07/31/23 0733  AST 14*  ALT 19  ALKPHOS 90  BILITOT 0.6  PROT 7.2  ALBUMIN  3.7   No results for input(s): "LIPASE", "AMYLASE" in the last 168 hours. No results for input(s): "AMMONIA" in the last 168 hours. Coagulation Profile: No results for input(s): "INR", "PROTIME" in the last 168 hours. Cardiac Enzymes: No results for input(s): "CKTOTAL", "CKMB", "CKMBINDEX", "TROPONINI" in the last 168 hours. BNP (last 3 results) No results for input(s): "PROBNP" in the last 8760 hours. HbA1C: No results for input(s): "HGBA1C" in the last 72 hours.  CBG: Recent Labs  Lab 08/01/23 1654 08/02/23 0653 08/02/23 1122 08/02/23 1610 08/03/23 0646  GLUCAP 130* 178* 164* 205* 177*   Lipid Profile: No results for input(s): "CHOL", "HDL", "LDLCALC", "TRIG", "CHOLHDL", "LDLDIRECT" in the last 72 hours. Thyroid Function Tests: No results for input(s): "TSH", "T4TOTAL", "FREET4", "T3FREE", "THYROIDAB" in the last 72 hours.  Anemia Panel: No results for input(s): "VITAMINB12", "FOLATE", "FERRITIN", "TIBC", "IRON", "RETICCTPCT" in the last 72 hours. Urine analysis:    Component Value Date/Time   COLORURINE YELLOW 07/03/2021 2032   APPEARANCEUR HAZY (A) 07/03/2021 2032   LABSPEC 1.015 07/03/2021 2032   PHURINE 8.0 07/03/2021 2032   GLUCOSEU NEGATIVE 07/03/2021 2032   HGBUR SMALL (A) 07/03/2021 2032   BILIRUBINUR NEGATIVE 07/03/2021 2032   KETONESUR NEGATIVE 07/03/2021 2032   PROTEINUR 100 (A) 07/03/2021 2032   UROBILINOGEN 0.2 03/20/2014 0237   NITRITE POSITIVE (A) 07/03/2021 2032   LEUKOCYTESUR LARGE (A) 07/03/2021 2032   Sepsis Labs: @LABRCNTIP (procalcitonin:4,lacticidven:4)  ) Recent Results (from the past 240 hours)  MRSA Next Gen by PCR, Nasal     Status: None   Collection Time: 07/31/23  3:02 PM   Specimen: Nasal Mucosa; Nasal Swab  Result Value Ref Range Status   MRSA by PCR Next Gen NOT DETECTED NOT DETECTED Final    Comment: (NOTE) The GeneXpert MRSA Assay (FDA approved  for NASAL specimens only), is one component of a comprehensive MRSA colonization surveillance program. It is not intended to diagnose MRSA infection nor to guide or monitor treatment for MRSA infections. Test performance is not FDA approved in patients less than 57 years old. Performed at [facility], [address] [place], [place] [address]      Radiology Studies: DG CHEST PORT 1 VIEW Result Date: 08/03/2023 CLINICAL DATA:  Shortness of breath EXAM: PORTABLE CHEST 1 VIEW COMPARISON:  07/30/2023 FINDINGS: Stable cardiomediastinal contours. Decreased pleural effusions and interstitial edema. Atelectasis in the left base is improved from previous exam. No new findings. Visualized osseous structures appear grossly intact. IMPRESSION: 1. Decreased pleural effusions and interstitial edema. 2. Improved left base atelectasis. Electronically Signed   By: Kimberley Penman M.D.   On: 08/03/2023 08:20     Scheduled Meds:  apixaban   5 mg Oral BID   arformoterol   15 mcg Nebulization BID   atorvastatin   40 mg Oral Daily   buprenorphine -naloxone   1 tablet Sublingual Q8H   diltiazem   90 mg Oral Q8H   furosemide   40 mg Intravenous BID   insulin  aspart  6 Units Subcutaneous TID WC   insulin  glargine-yfgn  45 Units Subcutaneous Daily   metoprolol  tartrate  25 mg Oral BID   pregabalin   75 mg Oral Q12H   revefenacin  175 mcg Nebulization Daily   spironolactone   12.5 mg Oral Daily   Continuous Infusions:     LOS: 4 days    Time spent:    Deforest Fast, MD [facility]   08/03/2023, 10:29 AM

## 2023-08-03 NOTE — Plan of Care (Signed)

## 2023-08-03 NOTE — Progress Notes (Signed)
 Patient taking this evening meds when noticed her arms jerk twice involuntarily. She said that she has been having this problem for about a year with random involuntarily jerks of her body. She said it has gotten worse over the year and then describes that initially she knew it was happening but others could not see it but now it has gotten to the point where it is noticeable to others.  I told her I would put a nursing note in the chart so that those following her will see and also remind her to address this during rounds. Also passed onto day team during report.

## 2023-08-03 NOTE — Progress Notes (Signed)
   Cardiologist:  Hilty  Subjective:  Denies SSCP, palpitations or Dyspnea Eating breakfast Speak well with phonetic device  Objective:  Vitals:   08/03/23 0420 08/03/23 0616 08/03/23 0747 08/03/23 0831  BP:  (!) 112/90 97/68   Pulse:      Resp: 11     Temp:   98.6 F (37 C)   TempSrc:   Oral   SpO2:    94%    Intake/Output from previous day:  Intake/Output Summary (Last 24 hours) at 08/03/2023 1031 Last data filed at 08/03/2023 0747 Gross per 24 hour  Intake 720 ml  Output 2600 ml  Net -1880 ml    Physical Exam:  Obese female Chronic trach Lungs clear No murmur  Post left BKA  Lab Results: Basic Metabolic Panel: Recent Labs    08/02/23 0214 08/03/23 0224  NA 138 139  K 4.1 4.1  CL 93* 93*  CO2 35* 36*  GLUCOSE 226* 229*  BUN 35* 34*  CREATININE 1.49* 1.29*  CALCIUM  9.0 9.0   Liver Function Tests: No results for input(s): "AST", "ALT", "ALKPHOS", "BILITOT", "PROT", "ALBUMIN " in the last 72 hours.  No results for input(s): "LIPASE", "AMYLASE" in the last 72 hours. CBC: No results for input(s): "WBC", "NEUTROABS", "HGB", "HCT", "MCV", "PLT" in the last 72 hours.   Hemoglobin A1C: No results for input(s): "HGBA1C" in the last 72 hours.   Thyroid Function Tests: No results for input(s): "TSH", "T4TOTAL", "T3FREE", "THYROIDAB" in the last 72 hours.  Invalid input(s): "FREET3"    Imaging: DG CHEST PORT 1 VIEW Result Date: 08/03/2023 CLINICAL DATA:  Shortness of breath EXAM: PORTABLE CHEST 1 VIEW COMPARISON:  07/30/2023 FINDINGS: Stable cardiomediastinal contours. Decreased pleural effusions and interstitial edema. Atelectasis in the left base is improved from previous exam. No new findings. Visualized osseous structures appear grossly intact. IMPRESSION: 1. Decreased pleural effusions and interstitial edema. 2. Improved left base atelectasis. Electronically Signed   By: Kimberley Penman M.D.   On: 08/03/2023 08:20    Cardiac Studies:  ECG: afib  nonspecific ST chagnes    Telemetry:  afib rates 90   Echo: EF 55-60% mod LAE AV sclerosis 08/01/23  Medications:    apixaban   5 mg Oral BID   arformoterol   15 mcg Nebulization BID   atorvastatin   40 mg Oral Daily   buprenorphine -naloxone   1 tablet Sublingual Q8H   diltiazem   90 mg Oral Q8H   furosemide   40 mg Intravenous BID   insulin  aspart  6 Units Subcutaneous TID WC   insulin  glargine-yfgn  45 Units Subcutaneous Daily   metoprolol  tartrate  25 mg Oral BID   [START ON 08/04/2023] pregabalin   50 mg Oral QHS   revefenacin  175 mcg Nebulization Daily   spironolactone   12.5 mg Oral Daily      Assessment/Plan:   Afib:  rates better controlled on cardizem  90 mg q 8 and lopressor  25 mg bid. Moderate LAE on echo. On eliquis  5 mg bid Plan outpatient f/u 3-4 weeks and possible DCC. Not a candidate for TEE/DCC given chronic trach and airway issues Diastolic CHF:  improved with good diuresis contnue lasix  daily change to PO and aldactone . CXR on admission mild CHF EF normal on TTE BNP only 151   Janelle Mediate 08/03/2023, 10:31 AM

## 2023-08-03 NOTE — Plan of Care (Signed)
  Problem: Education: Goal: Knowledge of General Education information will improve Description: Including pain rating scale, medication(s)/side effects and non-pharmacologic comfort measures Outcome: Progressing   Problem: Clinical Measurements: Goal: Respiratory complications will improve Outcome: Progressing Goal: Cardiovascular complication will be avoided Outcome: Progressing   Problem: Skin Integrity: Goal: Risk for impaired skin integrity will decrease Outcome: Progressing

## 2023-08-04 ENCOUNTER — Other Ambulatory Visit (HOSPITAL_COMMUNITY): Payer: Self-pay

## 2023-08-04 DIAGNOSIS — I5033 Acute on chronic diastolic (congestive) heart failure: Secondary | ICD-10-CM | POA: Diagnosis not present

## 2023-08-04 LAB — BASIC METABOLIC PANEL WITH GFR
Anion gap: 11 (ref 5–15)
BUN: 37 mg/dL — ABNORMAL HIGH (ref 8–23)
CO2: 35 mmol/L — ABNORMAL HIGH (ref 22–32)
Calcium: 8.8 mg/dL — ABNORMAL LOW (ref 8.9–10.3)
Chloride: 92 mmol/L — ABNORMAL LOW (ref 98–111)
Creatinine, Ser: 1.28 mg/dL — ABNORMAL HIGH (ref 0.44–1.00)
GFR, Estimated: 46 mL/min — ABNORMAL LOW (ref >60)
Glucose, Bld: 251 mg/dL — ABNORMAL HIGH (ref 70–99)
Potassium: 4.1 mmol/L (ref 3.5–5.1)
Sodium: 138 mmol/L (ref 135–145)

## 2023-08-04 LAB — GLUCOSE, CAPILLARY
Glucose-Capillary: 205 mg/dL — ABNORMAL HIGH (ref 70–99)
Glucose-Capillary: 281 mg/dL — ABNORMAL HIGH (ref 70–99)

## 2023-08-04 MED ORDER — LANTUS SOLOSTAR 100 UNIT/ML ~~LOC~~ SOPN: 50.0000 [IU] | PEN_INJECTOR | Freq: Every day | SUBCUTANEOUS | Status: AC

## 2023-08-04 MED ORDER — METOPROLOL TARTRATE 25 MG PO TABS
25.0000 mg | ORAL_TABLET | Freq: Two times a day (BID) | ORAL | 1 refills | Status: AC
  Filled 2023-08-04: qty 60, 30d supply, fill #0

## 2023-08-04 MED ORDER — FUROSEMIDE 40 MG PO TABS
40.0000 mg | ORAL_TABLET | Freq: Every day | ORAL | 1 refills | Status: AC
  Filled 2023-08-04: qty 30, 30d supply, fill #0

## 2023-08-04 MED ORDER — APIXABAN 5 MG PO TABS
5.0000 mg | ORAL_TABLET | Freq: Two times a day (BID) | ORAL | 1 refills | Status: AC
  Filled 2023-08-04: qty 60, 30d supply, fill #0

## 2023-08-04 MED ORDER — SPIRONOLACTONE 25 MG PO TABS
12.5000 mg | ORAL_TABLET | Freq: Every day | ORAL | 1 refills | Status: AC
  Filled 2023-08-04: qty 30, 60d supply, fill #0

## 2023-08-04 MED ORDER — DILTIAZEM HCL ER COATED BEADS 300 MG PO CP24
300.0000 mg | ORAL_CAPSULE | Freq: Every day | ORAL | 0 refills | Status: AC
  Filled 2023-08-04: qty 90, 90d supply, fill #0

## 2023-08-04 MED ORDER — DILTIAZEM HCL ER COATED BEADS 180 MG PO CP24
300.0000 mg | ORAL_CAPSULE | Freq: Every day | ORAL | Status: DC
  Administered 2023-08-04: 300 mg via ORAL
  Filled 2023-08-04: qty 1

## 2023-08-04 MED ORDER — INSULIN GLARGINE-YFGN 100 UNIT/ML ~~LOC~~ SOLN
50.0000 [IU] | Freq: Every day | SUBCUTANEOUS | Status: DC
  Administered 2023-08-04: 50 [IU] via SUBCUTANEOUS
  Filled 2023-08-04: qty 0.5

## 2023-08-04 NOTE — Discharge Summary (Signed)
 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   Obstructive sleep apnea   Chronic pain syndrome   Peripheral vascular disease (HCC)   Former smoker   Controlled type 2 diabetes mellitus with diabetic nephropathy (HCC)   Status post below-knee amputation of left lower extremity (HCC)   COPD (chronic obstructive pulmonary disease) (HCC)   PAF (paroxysmal atrial fibrillation) (HCC)   Acute on chronic respiratory failure with hypoxia and hypercapnia (HCC)   CHF (congestive heart failure) (HCC)   Persistent atrial fibrillation (HCC)   Discharge Condition: Improved  Diet recommendation: Low-sodium, heart healthy, diabetic  There were no vitals filed for this visit.  History of present illness:  [name] is a 67/F with history of head and neck CA status post total laryngectomy, tracheostomy, former smoker, obese, type II diabetic, peripheral vascular disease with left BKA, paroxysmal A-fib poorly compliant with Eliquis , OSA, history of COPD, chronic pain syndrome, narcotic dependence presented to the ED with progressive shortness of breath, swelling.  She reports chronic orthopnea at baseline.  Mild chronic cough is unchanged, intermittent wheezing reported as well, denies fevers chills or increased secretions at this time.  Previously was on Lasix  but has not taken this for years ED Course: Noted to be in A-fib with RVR, volume overloaded, mildly hypoxic placed on 2 L O2 via trach collar, labs noted creatinine  1.1, glucose 247, BNP 151, troponin 16, hemoglobin 15. - Chest x-ray with pulmonary vascular congestion and CHF changes, EKG noted A-fib RVR, given IV Lasix  and started on Cardizem  gtt.  Hospital Course:    Acute on chronic diastolic CHF Acute hypoxic respiratory failure -2D echo noted preserved EF, indeterminate diastolic function, normal RV - Improving with diuresis, 6.5 L negative - Once status has improved, transition to oral Lasix , Aldactone  -poor candidate for SGLT2i - Weaned O2 down to 2 to 3 L via trach collar -Would benefit from rhythm control, plan for cardiology follow-up in few weeks for consideration of cardioversion   Paroxysmal A-fib with RVR - Heart rate improved on Cardizem  and Lopressor  - She is poorly compliant with Eliquis  at baseline -Cards following, poor candidate for TEE/DCCV considering chronic trach - TSH is normal --Plan for outpatient follow-up in 3 to 4 weeks, possible DCCV   Chronic tracheostomy -Routine trach care     Chronic pain syndrome -Home regimen of narcotics continued     Peripheral vascular disease (HCC) Left BKA   COPD, former smoker -Continue home regimen of albuterol  as needed and ICS/LABA   Type 2 diabetes mellitus with hyperglycemia - CBGs elevated, increase dose of insulin  and meal coverage, A1c is 7.9 - Continue Lantus , needs diet and lifestyle modification  Discharge Exam: Vitals:   08/04/23 0737 08/04/23 0743  BP:  136/78  Pulse:    Resp:    Temp:    SpO2: 95%    General exam: Obese pleasant female sitting up in bed, AAO x 3 HEENT: Tracheostomy with trach collar CVS: S1-S2, irregular rhythm Lungs: Few scattered rhonchi  otherwise clear Abdomen: Soft, nontender, bowel sounds present Extremities: Trace edema, left BKA  Skin: No rashes Psychiatry:  Mood & affect appropriate  Discharge Instructions   Discharge Instructions     Diet - low sodium heart healthy   Complete by: As directed    Increase activity slowly    Complete by: As directed       Allergies as of 08/04/2023       Reactions   Ace Inhibitors Anaphylaxis, Swelling   Angioedema   Zestril [lisinopril] Anaphylaxis, Swelling   Angioedema        Medication List     STOP taking these medications    ibuprofen  800 MG tablet Commonly known as: ADVIL        TAKE these medications    atorvastatin  20 MG tablet Commonly known as: LIPITOR Take 20 mg by mouth at bedtime.   Bevespi  Aerosphere 9-4.8 MCG/ACT Aero Generic drug: Glycopyrrolate-Formoterol  Inhale 2 puffs into the lungs 2 (two) times daily. What changed: when to take this   Buprenorphine  HCl-Naloxone  HCl 8-2 MG Film Place 1 Film under the tongue 3 (three) times daily.   cyclobenzaprine  10 MG tablet Commonly known as: FLEXERIL  Take 10 mg by mouth at bedtime.   diltiazem  300 MG 24 hr capsule Commonly known as: Cardizem  CD Take 1 capsule (300 mg total) by mouth daily. What changed:  medication strength how much to take   DULCOLAX PO Take 2 tablets by mouth as needed.   Eliquis  5 MG Tabs tablet Generic drug: apixaban  Take 1 tablet (5 mg total) by mouth 2 (two) times daily.   furosemide  40 MG tablet Commonly known as: LASIX  Take 1 tablet (40 mg total) by mouth daily.   insulin  aspart 100 UNIT/ML FlexPen Commonly known as: NOVOLOG  Inject 10 Units into the skin 3 (three) times daily with meals.   Lantus  SoloStar 100 UNIT/ML Solostar Pen Generic drug: insulin  glargine Inject 50 Units into the skin daily. At 10AM What changed: how much to take   metoprolol  tartrate 25 MG tablet Commonly known as: LOPRESSOR  Take 1 tablet (25 mg total) by mouth 2 (two) times daily.   ondansetron  4 MG disintegrating tablet Commonly known as: ZOFRAN -ODT Take 4 mg by mouth every 8 (eight) hours as needed for nausea or vomiting.   Pen Needles 3/16" 31G X 5 MM Misc Use as directed.   pregabalin  75 MG capsule Commonly known as: LYRICA  Take 75 mg by mouth at bedtime.    spironolactone  25 MG tablet Commonly known as: ALDACTONE  Take 0.5 tablets (12.5 mg total) by mouth daily. Start taking on: August 05, 2023   Ventolin  HFA 108 (90 Base) MCG/ACT inhaler Generic drug: albuterol  Inhale 2 puffs into the lungs every 4 (four) hours as needed for wheezing or shortness of breath.               Durable Medical Equipment  (From admission, onward)           Start     Ordered   08/04/23 1131  For home use only DME oxygen  Once       Comments: Via [place] collar 3L  Question Answer Comment  Length of Need 6 Months   Mode or (Route) Mask   Liters per Minute 3   Frequency Continuous (stationary and portable oxygen unit needed)   Oxygen conserving device Yes   Oxygen delivery system Gas      08/04/23 1131   08/01/23 1458  For home use only DME  Other see comment  Once       Comments: Drop down bedside commode  Question:  Length of Need  Answer:  Lifetime   08/01/23 1458           Allergies  Allergen Reactions   Ace Inhibitors Anaphylaxis and Swelling    Angioedema   Zestril [Lisinopril] Anaphylaxis and Swelling    Angioedema      The results of significant diagnostics from this hospitalization (including imaging, microbiology, ancillary and laboratory) are listed below for reference.    Significant Diagnostic Studies: DG CHEST PORT 1 VIEW Result Date: 08/03/2023 CLINICAL DATA:  Shortness of breath EXAM: PORTABLE CHEST 1 VIEW COMPARISON:  07/30/2023 FINDINGS: Stable cardiomediastinal contours. Decreased pleural effusions and interstitial edema. Atelectasis in the left base is improved from previous exam. No new findings. Visualized osseous structures appear grossly intact. IMPRESSION: 1. Decreased pleural effusions and interstitial edema. 2. Improved left base atelectasis. Electronically Signed   By: Kimberley Penman M.D.   On: 08/03/2023 08:20   ECHOCARDIOGRAM COMPLETE Result Date: 08/01/2023    ECHOCARDIOGRAM REPORT   Patient Name:   DAMISHA WOLFF Date of Exam: 08/01/2023 Medical Rec #:  [id number]    Height:       66.0 in Accession #:    [id number]   Weight:       256.0 lb Date of Birth:  08/30/1955    BSA:          2.221 m Patient Age:    67 years     BP:           150/99 mmHg Patient Gender: F            HR:           116 bpm. Exam Location:  Inpatient Procedure: 2D Echo, Cardiac Doppler, Color Doppler and Intracardiac            Opacification Agent (Both Spectral and Color Flow Doppler were            utilized during procedure). Indications:    CHF  History:        Patient has prior history of Echocardiogram examinations, most                 recent 09/09/2022. Risk Factors:Hypertension and Diabetes.  Sonographer:    Janette Medley Referring Phys: [id number] Shaneika Rossa IMPRESSIONS  1. Left ventricular ejection fraction, by estimation, is 55 to 60%. The left ventricle has normal function. The left ventricle has no regional wall motion abnormalities. There is mild left ventricular hypertrophy. Left ventricular diastolic parameters are indeterminate.  2. Right ventricular systolic function is normal. The right ventricular size is normal.  3. Left atrial size was moderately dilated.  4. The mitral valve is abnormal. No evidence of mitral valve regurgitation. No evidence of mitral stenosis. Severe mitral annular calcification.  5. The aortic valve is tricuspid. There is mild calcification of the aortic valve. There is mild thickening of the aortic valve. Aortic valve regurgitation is not visualized. Aortic valve sclerosis is present, with no evidence of aortic valve stenosis.  6. Aortic dilatation noted. There is mild dilatation of the ascending aorta, measuring 40 mm.  7. The inferior vena cava is dilated in size with >50% respiratory variability, suggesting right atrial pressure of 8 mmHg. FINDINGS  Left Ventricle: Left ventricular ejection fraction, by estimation, is 55 to 60%. The left ventricle has normal function. The left ventricle has no regional wall  motion abnormalities.  Strain was performed and the global longitudinal strain is indeterminate. The left ventricular internal cavity size was normal in size. There is mild left ventricular hypertrophy. Left ventricular diastolic parameters are indeterminate. Right Ventricle: The right ventricular size is normal. No increase in right ventricular wall thickness. Right ventricular systolic function is normal. Left Atrium: Left atrial size was moderately dilated. Right Atrium: Right atrial size was normal in size. Pericardium: There is no evidence of pericardial effusion. Mitral Valve: The mitral valve is abnormal. There is severe thickening of the mitral valve leaflet(s). There is severe calcification of the mitral valve leaflet(s). Severe mitral annular calcification. No evidence of mitral valve regurgitation. No evidence of mitral valve stenosis. Tricuspid Valve: The tricuspid valve is normal in structure. Tricuspid valve regurgitation is not demonstrated. No evidence of tricuspid stenosis. Aortic Valve: The aortic valve is tricuspid. There is mild calcification of the aortic valve. There is mild thickening of the aortic valve. Aortic valve regurgitation is not visualized. Aortic valve sclerosis is present, with no evidence of aortic valve stenosis. Pulmonic Valve: The pulmonic valve was normal in structure. Pulmonic valve regurgitation is trivial. No evidence of pulmonic stenosis. Aorta: Aortic dilatation noted. There is mild dilatation of the ascending aorta, measuring 40 mm. Venous: The inferior vena cava is dilated in size with greater than 50% respiratory variability, suggesting right atrial pressure of 8 mmHg. IAS/Shunts: No atrial level shunt detected by color flow Doppler. Additional Comments: 3D was performed not requiring image post processing on an independent workstation and was indeterminate.  LEFT VENTRICLE PLAX 2D LVIDd:         5.00 cm   Diastology LVIDs:         3.40 cm   LV e' medial:    7.29 cm/s  LV PW:         1.10 cm   LV E/e' medial:  19.9 LV IVS:        1.30 cm   LV e' lateral:   7.18 cm/s LVOT diam:     2.20 cm   LV E/e' lateral: 20.2 LV SV:         60 LV SV Index:   27 LVOT Area:     3.80 cm  RIGHT VENTRICLE             IVC RV S prime:     10.00 cm/s  IVC diam: 2.60 cm TAPSE (M-mode): 2.0 cm LEFT ATRIUM             Index        RIGHT ATRIUM           Index LA diam:        4.80 cm 2.16 cm/m   RA Area:     18.40 cm LA Vol (A2C):   77.7 ml 34.99 ml/m  RA Volume:   46.80 ml  21.07 ml/m LA Vol (A4C):   50.0 ml 22.51 ml/m LA Biplane Vol: 64.0 ml 28.82 ml/m  AORTIC VALVE LVOT Vmax:   90.70 cm/s LVOT Vmean:  59.200 cm/s LVOT VTI:    0.158 m  AORTA Ao Root diam: 3.00 cm Ao Asc diam:  4.00 cm MITRAL VALVE MV Area (PHT): 2.84 cm     SHUNTS MV Decel Time: 267 msec     Systemic VTI:  0.16 m MV E velocity: 145.00 cm/s  Systemic Diam: 2.20 cm Janelle Mediate MD Electronically signed by Janelle Mediate MD Signature Date/Time: 08/01/2023/9:54:58 AM    Final    DG  Chest Port 1 View Result Date: 07/30/2023 CLINICAL DATA:  Shortness of breath for the past 2-3 days.  Hypoxia. EXAM: PORTABLE CHEST 1 VIEW COMPARISON:  09/08/2022 FINDINGS: Improved inspiration. Heart size near the upper limit of normal. Prominent pulmonary vasculature and interstitial markings with mild progression. Probable small bilateral pleural effusions. Thoracic spine degenerative changes. IMPRESSION: Mild changes of acute congestive heart failure. Electronically Signed   By: Catherin Closs M.D.   On: 07/30/2023 13:49    Microbiology: Recent Results (from the past 240 hours)  MRSA Next Gen by PCR, Nasal     Status: None   Collection Time: 07/31/23  3:02 PM   Specimen: Nasal Mucosa; Nasal Swab  Result Value Ref Range Status   MRSA by PCR Next Gen NOT DETECTED NOT DETECTED Final    Comment: (NOTE) The GeneXpert MRSA Assay (FDA approved for NASAL specimens only), is one component of a comprehensive MRSA colonization surveillance program. It is  not intended to diagnose MRSA infection nor to guide or monitor treatment for MRSA infections. Test performance is not FDA approved in patients less than 58 years old. Performed at [facility], [address] [place], [place] [address]      Labs: Basic Metabolic Panel: Recent Labs  Lab 07/31/23 [phone] 08/01/23 0311 08/02/23 0214 08/03/23 0224 08/04/23 0224  NA 136 138 138 139 138  K 4.4 3.4* 4.1 4.1 4.1  CL 95* 95* 93* 93* 92*  CO2 32 32 35* 36* 35*  GLUCOSE 319* 243* 226* 229* 251*  BUN 23 28* 35* 34* 37*  CREATININE 1.22* 1.41* 1.49* 1.29* 1.28*  CALCIUM  8.6* 8.3* 9.0 9.0 8.8*  MG 2.1  --   --   --   --    Liver Function Tests: Recent Labs  Lab 07/31/23 0733  AST 14*  ALT 19  ALKPHOS 90  BILITOT 0.6  PROT 7.2  ALBUMIN  3.7   No results for input(s): "LIPASE", "AMYLASE" in the last 168 hours. No results for input(s): "AMMONIA" in the last 168 hours. CBC: Recent Labs  Lab 07/30/23 1303 07/30/23 1341 07/31/23 0733  WBC 7.7  --  7.5  NEUTROABS 5.4  --   --   HGB 15.2* 16.0* 16.3*  HCT 47.3* 47.0* 51.5*  MCV 91.0  --  91.5  PLT 233  --  247   Cardiac Enzymes: No results for input(s): "CKTOTAL", "CKMB", "CKMBINDEX", "TROPONINI" in the last 168 hours. BNP: BNP (last 3 results) Recent Labs    09/07/22 2258 07/30/23 1303  BNP 274.9* 151.3*    ProBNP (last 3 results) No results for input(s): "PROBNP" in the last 8760 hours.  CBG: Recent Labs  Lab 08/02/23 1610 08/03/23 0646 08/03/23 1111 08/03/23 1606 08/04/23 0557  GLUCAP 205* 177* 174* 158* 205*       Signed:  Deforest Fast MD.  [facility] 08/04/2023, 11:32 AM

## 2023-08-04 NOTE — Progress Notes (Signed)
  Sixtos, Monica, Theatre manager   Progress Notes    Signed   Date of Service: 08/04/2023 10:41 AM   Signed      SATURATION QUALIFICATIONS: (This note is used to comply with regulatory documentation for home oxygen)   Patient Saturations on Room Air at Rest = 87%   Patient Saturations on Room Air while Ambulating = %   Patient Saturations on 2 Liters of oxygen while in bed = 93%   Please briefly explain why patient needs home oxygen:   Patient does not ambulated, wheel chair bound.             Durable Medical Equipment (From admission, onward)        Start     Ordered  08/04/23 1027  For home use only DME oxygen  Once      Question Answer Comment Length of Need 6 Months  Mode or (Route) Mask  Liters per Minute 2  Frequency Continuous (stationary and portable oxygen unit needed)  Oxygen conserving device Yes  Oxygen delivery system Gas    08/04/23 1026  08/01/23 1458  For home use only DME Other see comment  Once      Comments: Drop down bedside commode Question:  Length of Need  Answer:  Lifetime  08/01/23 1458

## 2023-08-04 NOTE — Progress Notes (Signed)
 Mekiyah Gladwell, RT can be reached at [phone]

## 2023-08-04 NOTE — Progress Notes (Signed)
 Went over discharge paperwork with patient and son. All questions answered. PIV/telemetry removed. All belongings at bedside.

## 2023-08-04 NOTE — Progress Notes (Addendum)
 SATURATION QUALIFICATIONS: (This note is used to comply with regulatory documentation for home oxygen)  Patient Saturations on Room Air at Rest = 87%  Patient Saturations on Room Air while Ambulating = %  Patient Saturations on 3 Liters of oxygen while in bed = 93%  Please briefly explain why patient needs home oxygen:  Patient does not ambulated, wheel chair bound.

## 2023-08-18 ENCOUNTER — Ambulatory Visit: Admitting: Nurse Practitioner

## 2023-08-18 NOTE — Progress Notes (Deleted)
 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  Past Medical History    Past Medical History:  Diagnosis Date   Anxiety    Arthritis    COPD (chronic obstructive pulmonary disease) (HCC)    Depression    Diabetes mellitus without complication (HCC)    INSULIN  DEPENDENT   Diabetic neuropathy (HCC)    Difficult intubation    total laryngectomy   Fibromyalgia    Head and neck cancer (HCC) 04/19/2015   Hypertension    Kidney stone    Morbid obesity with BMI of 40.0-44.9, adult (HCC)    Neuropathy    Nocturia    Pneumonia    Rash, skin    face   Renal insufficiency    Shortness of breath    Sleep apnea    has not used cpap past 10 yrs   Urge incontinence    Past Surgical History:  Procedure Laterality Date   AMPUTATION Left 08/25/2015   Procedure: 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;  Surgeon: Jerona LULLA Sage, MD;  Location: [facility] OR;  Service: Orthopedics;  Laterality: Left;   AMPUTATION Left 09/13/2015   Procedure: AMPUTATION BELOW KNEE;  Surgeon: Oneil JAYSON Herald, MD;  Location: [facility] OR;  Service: Orthopedics;  Laterality: Left;   BRAIN SURGERY     vertebral aneurysm   BREAST LUMPECTOMY  1982   benign   CARPEL TUNNEL     CHOLECYSTECTOMY     CYSTOSCOPY W/ URETERAL STENT PLACEMENT Left 03/22/2014   Procedure: CYSTOSCOPY WITH RETROGRADE PYELOGRAM/URETERAL STENT PLACEMENT;  Surgeon: Ricardo Likens, MD;  Location: [facility] OR;  Service:  Urology;  Laterality: Left;   CYSTOSCOPY WITH RETROGRADE PYELOGRAM, URETEROSCOPY AND STENT PLACEMENT Left 05/11/2014   Procedure: CYSTOSCOPY WITH RETROGRADE PYELOGRAM, URETEROSCOPY AND STENT PLACEMENT;  Surgeon: Ricardo Likens, MD;  Location: [facility] ORS;  Service: Urology;  Laterality: Left;   CYSTOSCOPY WITH STENT PLACEMENT Right 07/04/2021   Procedure: Cystoscopy with right retrograde pyelogram and right ureteral stent placement;  Surgeon: Carolee Sherwood JONETTA DOUGLAS, MD;  Location: [facility] OR;  Service: Urology;  Laterality: Right;   CYSTOSCOPY/URETEROSCOPY/HOLMIUM LASER Right 08/10/2021   Procedure: CYSTOSCOPY RIGHT AND STENT PLACEMENT;  Surgeon: Carolee Sherwood JONETTA DOUGLAS, MD;  Location: [facility] ORS;  Service: Urology;  Laterality: Right;   HOLMIUM LASER APPLICATION Left 05/11/2014   Procedure: HOLMIUM LASER APPLICATION;  Surgeon: Ricardo Likens, MD;  Location: [facility] ORS;  Service: Urology;  Laterality: Left;   LARYNGECTOMY     total ( stoma)   LEG AMPUTATION BELOW KNEE Left 09/13/2015   SALIVARY STONE REMOVAL     TONSILLECTOMY     TUBAL LIGATION      Allergies  Allergies  Allergen Reactions   Ace Inhibitors Anaphylaxis and Swelling    Angioedema   Zestril [Lisinopril] Anaphylaxis and Swelling    Angioedema     Labs/Other Studies Reviewed    The following studies were reviewed today:  Cardiac Studies & Procedures   ______________________________________________________________________________________________     ECHOCARDIOGRAM  ECHOCARDIOGRAM COMPLETE 08/01/2023  Narrative ECHOCARDIOGRAM REPORT    Patient Name:   [name] [facility] Date of Exam: 08/01/2023 Medical Rec #:  [id number]    Height:       66.0 in Accession #:    [id number]   Weight:       256.0 lb Date of Birth:  May 02, 1955    BSA:          2.221 m Patient Age:    67 years     BP:           150/99 mmHg Patient Gender: F            HR:           116 bpm. Exam Location:  Inpatient  Procedure: 2D Echo, Cardiac Doppler, Color Doppler and  Intracardiac Opacification Agent (Both Spectral and Color Flow Doppler were utilized during procedure).  Indications:    CHF  History:        Patient has prior history of Echocardiogram examinations, most recent 09/09/2022. Risk Factors:Hypertension and Diabetes.  Sonographer:    Philomena Daring Referring Phys: [id number] PREETHA JOSEPH  IMPRESSIONS   1. Left ventricular ejection fraction, by estimation, is 55 to 60%. The left ventricle has normal function. The left ventricle has no regional wall motion abnormalities. There is mild left ventricular hypertrophy. Left ventricular diastolic parameters are indeterminate. 2. Right ventricular systolic function is normal. The right ventricular size is normal. 3. Left atrial size was moderately dilated. 4. The mitral valve is abnormal. No evidence of mitral valve regurgitation. No evidence of mitral stenosis. Severe mitral annular calcification. 5. The aortic valve is tricuspid. There is mild calcification of the aortic valve. There is mild thickening of the aortic valve. Aortic valve regurgitation is not visualized. Aortic valve sclerosis is present, with no evidence of aortic valve stenosis. 6. Aortic dilatation noted. There is mild dilatation of the ascending aorta, measuring 40 mm. 7. The inferior vena cava is dilated in size with >50% respiratory variability, suggesting right atrial pressure of 8 mmHg.  FINDINGS Left Ventricle: Left ventricular ejection fraction, by estimation, is 55 to 60%. The left ventricle has normal function. The left ventricle has no regional wall motion abnormalities. Strain was performed and the global longitudinal strain is indeterminate. The left ventricular internal cavity size was normal in size. There is mild left ventricular hypertrophy. Left ventricular diastolic parameters are indeterminate.  Right Ventricle: The right ventricular size is normal. No increase in right ventricular wall thickness. Right ventricular  systolic function is normal.  Left Atrium: Left atrial size was moderately dilated.  Right Atrium: Right atrial size was normal in size.  Pericardium: There is no evidence of pericardial effusion.  Mitral Valve: The mitral valve is abnormal. There is severe thickening of the mitral valve leaflet(s). There is severe calcification of the mitral valve leaflet(s). Severe mitral annular calcification. No evidence of mitral valve regurgitation. No evidence of mitral valve stenosis.  Tricuspid Valve: The tricuspid valve is normal in structure. Tricuspid valve regurgitation is not demonstrated. No evidence of tricuspid stenosis.  Aortic Valve: The aortic valve is tricuspid. There is mild calcification of the aortic valve. There is mild thickening of the aortic valve. Aortic valve regurgitation is not visualized. Aortic valve sclerosis is present, with no evidence of aortic valve stenosis.  Pulmonic Valve: The pulmonic valve was normal in structure. Pulmonic valve regurgitation is trivial. No evidence of pulmonic stenosis.  Aorta: Aortic dilatation noted. There is mild dilatation of the  ascending aorta, measuring 40 mm.  Venous: The inferior vena cava is dilated in size with greater than 50% respiratory variability, suggesting right atrial pressure of 8 mmHg.  IAS/Shunts: No atrial level shunt detected by color flow Doppler.  Additional Comments: 3D was performed not requiring image post processing on an independent workstation and was indeterminate.   LEFT VENTRICLE PLAX 2D LVIDd:         5.00 cm   Diastology LVIDs:         3.40 cm   LV e' medial:    7.29 cm/s LV PW:         1.10 cm   LV E/e' medial:  19.9 LV IVS:        1.30 cm   LV e' lateral:   7.18 cm/s LVOT diam:     2.20 cm   LV E/e' lateral: 20.2 LV SV:         60 LV SV Index:   27 LVOT Area:     3.80 cm   RIGHT VENTRICLE             IVC RV S prime:     10.00 cm/s  IVC diam: 2.60 cm TAPSE (M-mode): 2.0 cm  LEFT ATRIUM              Index        RIGHT ATRIUM           Index LA diam:        4.80 cm 2.16 cm/m   RA Area:     18.40 cm LA Vol (A2C):   77.7 ml 34.99 ml/m  RA Volume:   46.80 ml  21.07 ml/m LA Vol (A4C):   50.0 ml 22.51 ml/m LA Biplane Vol: 64.0 ml 28.82 ml/m AORTIC VALVE LVOT Vmax:   90.70 cm/s LVOT Vmean:  59.200 cm/s LVOT VTI:    0.158 m  AORTA Ao Root diam: 3.00 cm Ao Asc diam:  4.00 cm  MITRAL VALVE MV Area (PHT): 2.84 cm     SHUNTS MV Decel Time: 267 msec     Systemic VTI:  0.16 m MV E velocity: 145.00 cm/s  Systemic Diam: 2.20 cm  Maude Emmer MD Electronically signed by Maude Emmer MD Signature Date/Time: 08/01/2023/9:54:58 AM    Final          ______________________________________________________________________________________________     Recent Labs: 07/30/2023: B Natriuretic Peptide 151.3 07/31/2023: ALT 19; Hemoglobin 16.3; Magnesium  2.1; Platelets 247; TSH 1.674 08/04/2023: BUN 37; Creatinine, Ser 1.28; Potassium 4.1; Sodium 138  Recent Lipid Panel    Component Value Date/Time   CHOL 176 09/10/2015 0332   TRIG 85 06/15/2018 0913   HDL 59 09/10/2015 0332   CHOLHDL 3.0 09/10/2015 0332   VLDL 17 09/10/2015 0332   LDLCALC 100 (H) 09/10/2015 0332    History of Present Illness    68 year old female with the above past medical history including persistent atrial fibrillation, chronic diastolic heart failure, hypertension, type 2 diabetes, COPD, chronic hypoxic respiratory failure, 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  She was diagnosed with atrial fibrillation during an admission in May 2022.  Echocardiogram the time showed EF 65 to 70%, severe LAE.  She was started on Eliquis  and diltiazem .  She did not have follow-up with cardiology.  She was hospitalized in July 2024 in the setting of COPD exacerbation.  Echocardiogram showed  EF 55 to 60%, normal  LV function, moderate LVH, mild hypokinesis of the distal inferior septal wall though poor visualization due to technically difficult study.  She has had intermittent adherence to Eliquis  (per notes, she was told not to take Advil  with Eliquis , she takes Advil  for chronic pain).  She presented to the ED on 07/30/2023 in the setting of shortness of breath, bilateral lower extremity edema.  She was treated for COPD exacerbation.  She was noted to be in coarse atrial fibrillation/atypical atrial flutter, HR in the 130s bpm.  BNP was elevated at 151.  Troponin was negative.  Chest x-ray showed prominent pulmonary vasculature and interstitial markings small bilateral pleural effusions.  She was started on diltiazem  and diuresed with IV Lasix .  Cardiology was consulted.  Echocardiogram showed EF 55 to 60%, no RWMA, mild LVH, normal RV, moderately dilated LA, severe MAC, aortic valve sclerosis, IVC dilated right atrial pressure estimated to be 8 mmHg.  She was not felt to be a candidate for TEE/DCCV in the setting of tracheostomy.  Adherence to Eliquis  was advised with plans for possible outpatient DCCV should she remain in atrial fibrillation/atrial flutter.  She was started on spironolactone .  SGLT2 inhibitor was avoided in the setting of history of BKA, UTI induced sepsis with hydronephrosis resulting in left kidney atrophy.  She was discharged home in stable condition on 08/04/2023 with home O2.  She presents today for follow-up.  Since her hospitalization  Persistent atrial fibrillation: Chronic diastolic heart failure: Hypertension: Diabetes: COPD/chronic hypoxic respiratory failure: PVD s/p left BKA: CKD: Chronic pain syndrome: Disposition:  Home Medications    Current Outpatient Medications  Medication Sig Dispense Refill   albuterol  (VENTOLIN  HFA) 108 (90 Base) MCG/ACT inhaler Inhale 2 puffs into the lungs every 4 (four) hours as needed for wheezing or shortness of breath. 18 g 2   apixaban   (ELIQUIS ) 5 MG TABS tablet Take 1 tablet (5 mg total) by mouth 2 (two) times daily. 60 tablet 1   atorvastatin  (LIPITOR) 20 MG tablet Take 20 mg by mouth at bedtime.     Buprenorphine  HCl-Naloxone  HCl 8-2 MG FILM Place 1 Film under the tongue 3 (three) times daily.      cyclobenzaprine  (FLEXERIL ) 10 MG tablet Take 10 mg by mouth at bedtime.     diltiazem  (CARDIZEM  CD) 300 MG 24 hr capsule Take 1 capsule (300 mg total) by mouth daily. 90 capsule 0   furosemide  (LASIX ) 40 MG tablet Take 1 tablet (40 mg total) by mouth daily. 30 tablet 1   Glycopyrrolate-Formoterol  (BEVESPI  AEROSPHERE) 9-4.8 MCG/ACT AERO Inhale 2 puffs into the lungs 2 (two) times daily. (Patient taking differently: Inhale 2 puffs into the lungs in the morning.) 10.7 g 11   insulin  aspart (NOVOLOG ) 100 UNIT/ML FlexPen Inject 10 Units into the skin 3 (three) times daily with meals. 15 mL 0   insulin  glargine (LANTUS  SOLOSTAR) 100 UNIT/ML Solostar Pen Inject 50 Units into the skin daily. At 10AM     Insulin  Pen Needle (PEN NEEDLES 3/16) 31G X 5 MM MISC Use as directed. 100 each 3   Magnesium  Hydroxide (DULCOLAX PO) Take 2 tablets by mouth as needed.     metoprolol  tartrate (LOPRESSOR ) 25 MG tablet Take 1 tablet (25 mg total) by mouth 2 (two) times daily. 60 tablet 1   ondansetron  (ZOFRAN -ODT) 4 MG disintegrating tablet Take 4 mg by mouth every 8 (eight) hours as needed for nausea or vomiting.     pregabalin  (LYRICA ) 75  MG capsule Take 75 mg by mouth at bedtime.     spironolactone  (ALDACTONE ) 25 MG tablet Take 0.5 tablets (12.5 mg total) by mouth daily. 30 tablet 1   No current facility-administered medications for this visit.     Review of Systems    ***.  All other systems reviewed and are otherwise negative except as noted above.    Physical Exam    VS:  There were no vitals taken for this visit. , BMI There is no height or weight on file to calculate BMI.     GEN: Well nourished, well developed, in no acute  distress. HEENT: normal. Neck: Supple, no JVD, carotid bruits, or masses. Cardiac: RRR, no murmurs, rubs, or gallops. No clubbing, cyanosis, edema.  Radials/DP/PT 2+ and equal bilaterally.  Respiratory:  Respirations regular and unlabored, clear to auscultation bilaterally. GI: Soft, nontender, nondistended, BS + x 4. MS: no deformity or atrophy. Skin: warm and dry, no rash. Neuro:  Strength and sensation are intact. Psych: Normal affect.  Accessory Clinical Findings    ECG personally reviewed by me today -    - no acute changes.   Lab Results  Component Value Date   WBC 7.5 07/31/2023   HGB 16.3 (H) 07/31/2023   HCT 51.5 (H) 07/31/2023   MCV 91.5 07/31/2023   PLT 247 07/31/2023   Lab Results  Component Value Date   CREATININE 1.28 (H) 08/04/2023   BUN 37 (H) 08/04/2023   NA 138 08/04/2023   K 4.1 08/04/2023   CL 92 (L) 08/04/2023   CO2 35 (H) 08/04/2023   Lab Results  Component Value Date   ALT 19 07/31/2023   AST 14 (L) 07/31/2023   ALKPHOS 90 07/31/2023   BILITOT 0.6 07/31/2023   Lab Results  Component Value Date   CHOL 176 09/10/2015   HDL 59 09/10/2015   LDLCALC 100 (H) 09/10/2015   TRIG 85 06/15/2018   CHOLHDL 3.0 09/10/2015    Lab Results  Component Value Date   HGBA1C 7.9 (H) 07/31/2023    Assessment & Plan    1.  ***  No BP recorded.  {Refresh Note OR Click here to enter BP  :1}***   Damien JAYSON Braver, NP 08/18/2023, 9:59 AM

## 2023-08-24 NOTE — Progress Notes (Deleted)
 Cardiology Office Note   Date:  08/24/2023  ID:  [name], DOB August 29, 1955, MRN [id number] PCP: Trudy Drinda LABOR, FNP  [facility] Providers Cardiologist:  Vinie JAYSON Maxcy, MD  History of Present Illness [name] is a 68 y.o. 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  Echocardiogram the time showed EF 65 to 70%, severe LAE.  She was started on Eliquis  and diltiazem .  She did not follow-up with cardiology.    She was hospitalized in July 2024 in the setting of COPD exacerbation.  Echocardiogram showed EF 55 to 60%, normal LV function, moderate LVH, mild hypokinesis of the distal inferior septal wall though poor visualization due to technically difficult study.  She has had intermittent adherence to Eliquis  (per notes, she was told not to take Advil  with Eliquis , she takes Advil  for chronic pain).    She presented to the ED on 07/30/2023 in the setting of shortness of breath, bilateral lower extremity edema.  She was treated for COPD exacerbation.  She was noted to be in coarse atrial fibrillation/atypical atrial flutter, HR in the 130s bpm.  BNP was elevated at 151.  Troponin was negative.  Chest x-ray showed prominent pulmonary vasculature and interstitial markings small bilateral pleural effusions.  She was started on diltiazem  and diuresed with IV Lasix .  Cardiology was consulted.  Echocardiogram 08/01/23 showed EF 55 to 60%, no RWMA, mild LVH, normal RV, moderately dilated LA, severe MAC, aortic valve sclerosis, IVC dilated right atrial pressure  estimated to be 8 mmHg.  She was not felt to be a candidate for TEE/DCCV in the setting of tracheostomy.  Adherence to Eliquis  was advised with plans for possible outpatient DCCV should she remain in atrial fibrillation/atrial flutter.  She was started on spironolactone .  SGLT2 inhibitor was avoided in the setting of history of BKA, UTI induced sepsis with hydronephrosis resulting in left kidney atrophy.  She was discharged home in stable condition on 08/04/2023 with home O2.  She presents today for follow-up.  Since her hospitalization  Persistent atrial fibrillation - First diagnosed in 06/2020. Recently admitted in 07/2023 with COPD exacerbation, atrial fibrillation, CHF. Had not been perfectly compliant with eliquis  prior to admission. Was not felt to be a candidate for TEE/DCCV due to tracheostomy. Discharged on eliquis , diltiazem  , metoprolol   -  - EKG today shows ***  - Continue eliquis  5 mg BID   Chronic diastolic heart failure - Echocardiogram 07/2023 showed EF 55-60%, no regional wall motion abnormalities, normal RV systolic function -  - Continue lasix  40 mg daily  - Continue spironolactone  12.5 mg daily  - Not candidate for SGLT2i due to history of BKA and UTI induced sepsis  - Ordered BMP for medication monitoring   Hypertension -  - Continue diltiazem  300 mg daily, metoprolol  tartrate 25 mg BID, spironolactone  12.5 mg daily  - Ordered BMP for medication monitoring   Mild dilation of the ascending aorta  - Measured 40 mm on echo in 07/2023  - Anticipate repeat echo in 1 year   Diabetes - On insulin   - Followed by PCP  COPD/chronic hypoxic respiratory failure - Followed by pulmonology   CKD - Creatinine 1.28 prior to DC on 6/9 - Ordered BMP for medication monitoring   Chronic pain syndrome    ROS: ***  Studies Reviewed      *** Risk Assessment/Calculations {Does this patient have ATRIAL FIBRILLATION?:[id number]} No BP recorded.  {Refresh Note OR Click here to  enter BP  :1}***       Physical Exam VS:  There were no vitals taken for this visit.       Wt Readings from Last 3 Encounters:  09/10/22 255 lb 15.3 oz (116.1 kg)  08/09/21 230 lb (104.3 kg)  07/05/21 252 lb 13.9 oz (114.7 kg)    GEN: Well nourished, well developed in no acute distress NECK: No JVD; No carotid bruits CARDIAC: ***RRR, no murmurs, rubs, gallops RESPIRATORY:  Clear to auscultation without rales, wheezing or rhonchi  ABDOMEN: Soft, non-tender, non-distended EXTREMITIES:  No edema; No deformity   ASSESSMENT AND PLAN ***    {Are you ordering a CV Procedure (e.g. stress test, cath, DCCV, TEE, etc)?   Press F2        :[id number]}  Dispo: ***  Signed, Rollo FABIENE Louder, PA-C

## 2023-08-25 ENCOUNTER — Ambulatory Visit: Admitting: Cardiology

## 2023-09-05 ENCOUNTER — Ambulatory Visit: Attending: Cardiology | Admitting: Cardiology

## 2023-09-06 ENCOUNTER — Other Ambulatory Visit (HOSPITAL_COMMUNITY): Payer: Self-pay

## 2024-03-09 IMAGING — CT CT ABD-PELV W/ CM
2 of 5 series · 13 of 46 positions shown, 15 images · IV contrast (agent unspecified)
Comparison: CT chest 11/14/2017, CT abdomen pelvis 03/21/2014

CLINICAL DATA: Pulmonary embolism (PE) suspected, high prob; status
post total laryngectomy for laryngeal carcinoma now with progressive
dyspnea. RLQ abdominal pain (Age >= 14y).



[Series 5: a/p w/ 5mm · axial · 0.98mm/px · z∈[+768,+1182]mm · 10 of 95 slices shown, 12 images]
[im 6/95  soft-tissue]
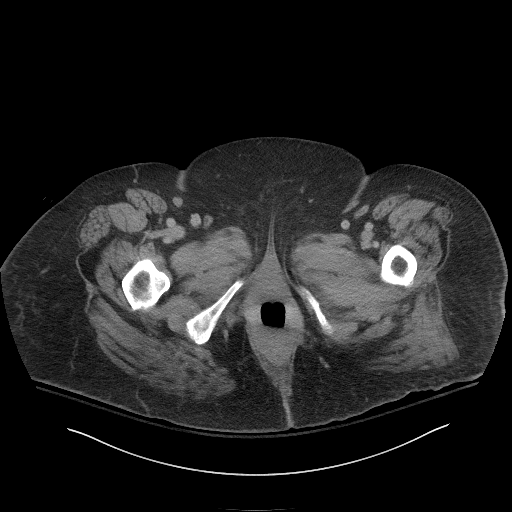
[im 6/95  bone]
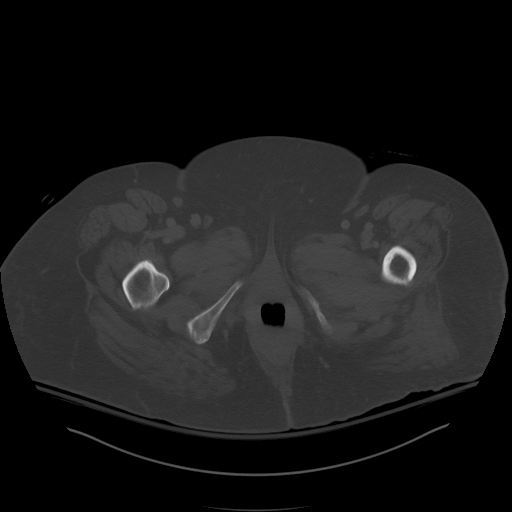
[im 16/95  soft-tissue]
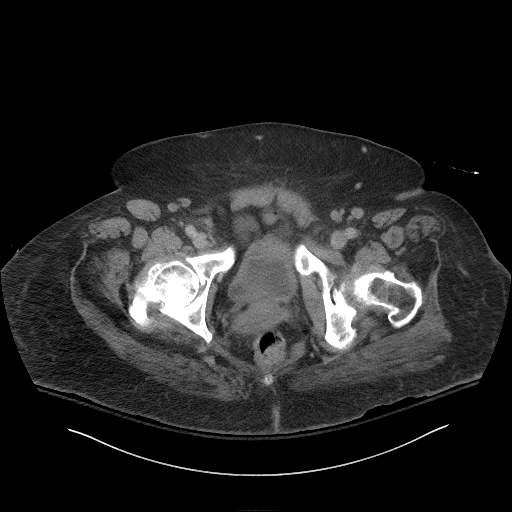
[im 27/95  soft-tissue]
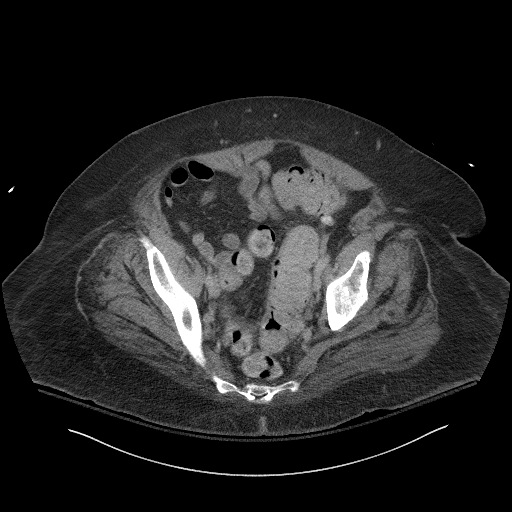
[im 32/95  soft-tissue]
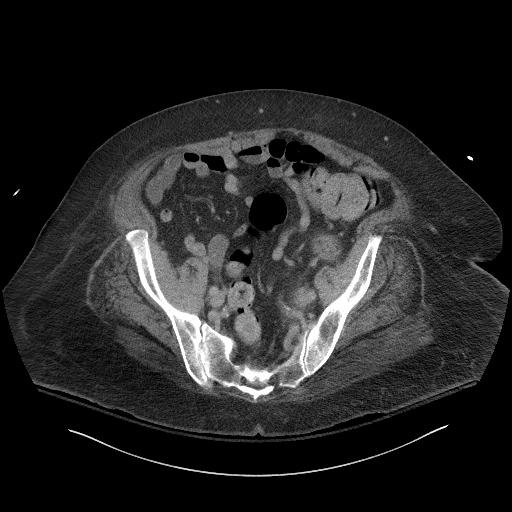
[im 42/95  soft-tissue]
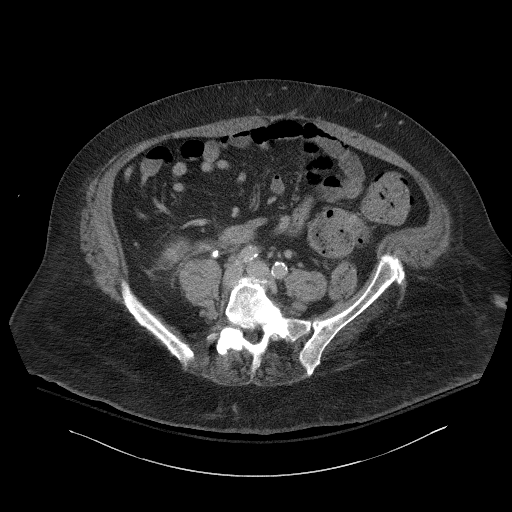
[im 53/95  soft-tissue]
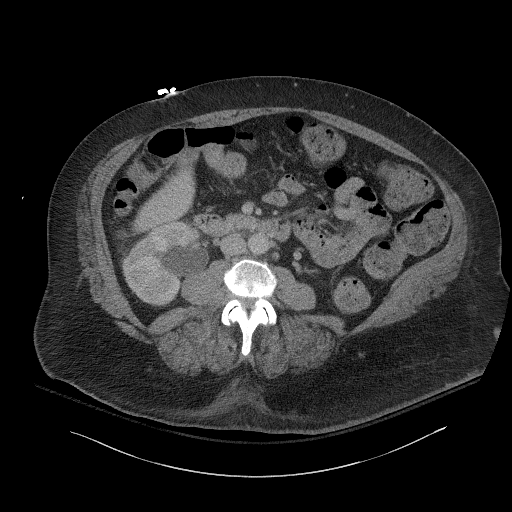
[im 63/95  soft-tissue]
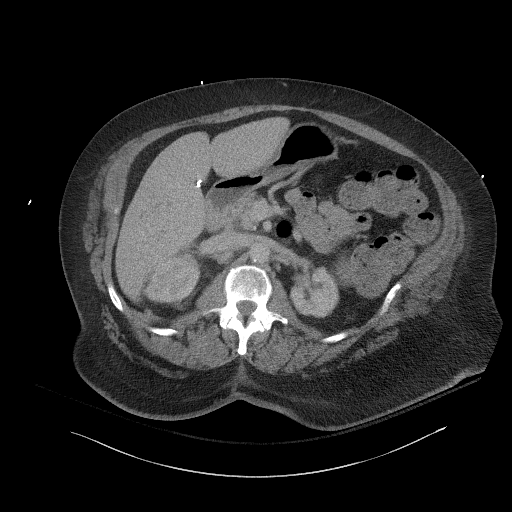
[im 68/95  soft-tissue]
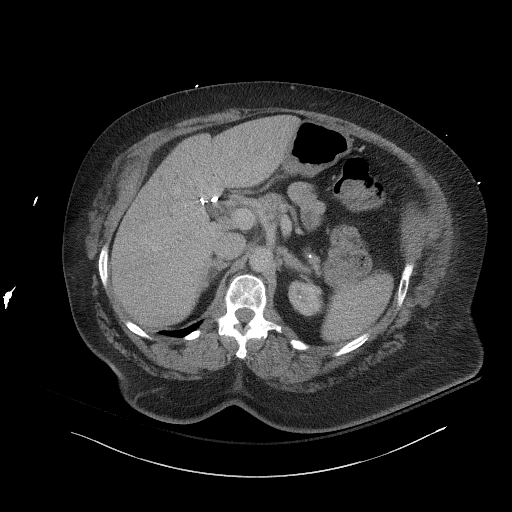
[im 79/95  soft-tissue]
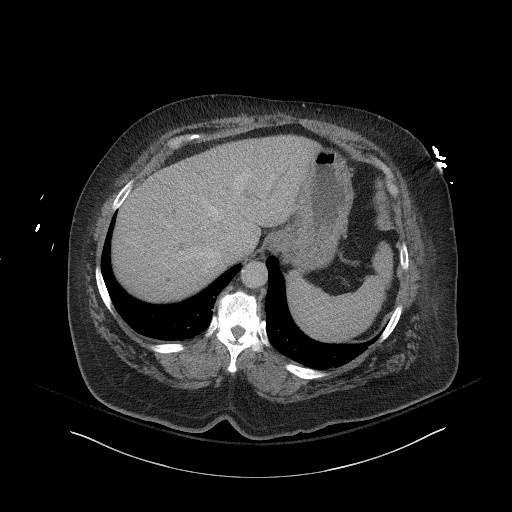
[im 79/95  bone]
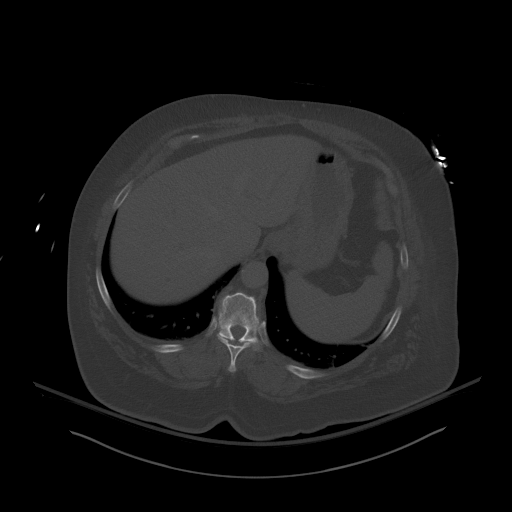
[im 89/95  soft-tissue]
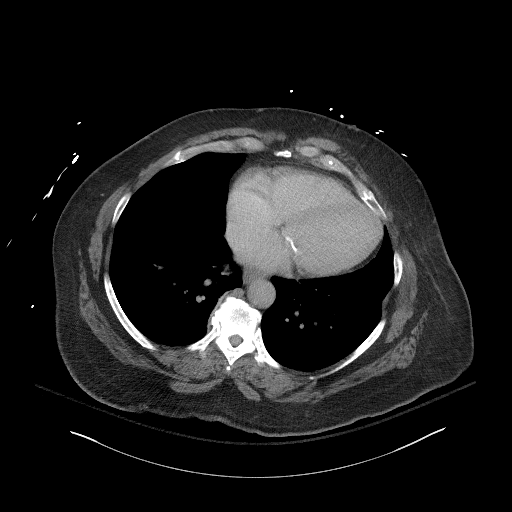

[Series 9: a/p w/ cor · coronal · 0.92mm/px · 3 of 190 slices shown]
[im 64/190  soft-tissue]
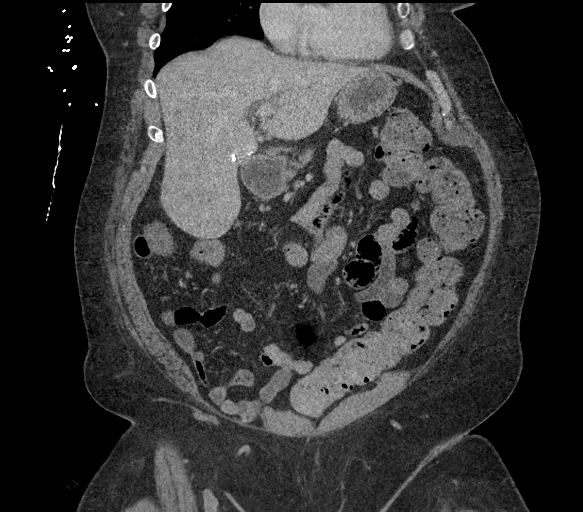
[im 85/190  soft-tissue]
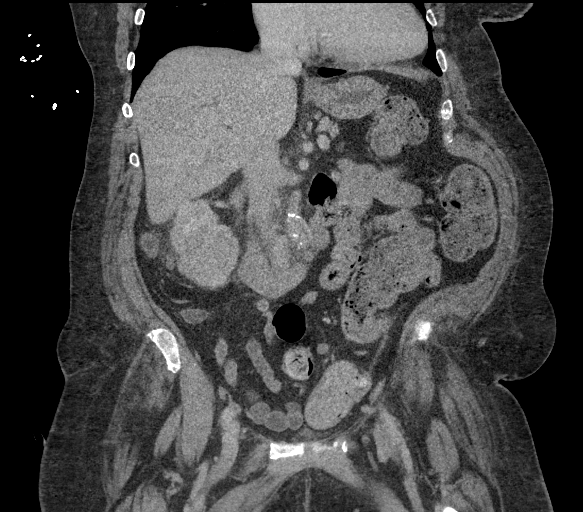
[im 106/190  soft-tissue]
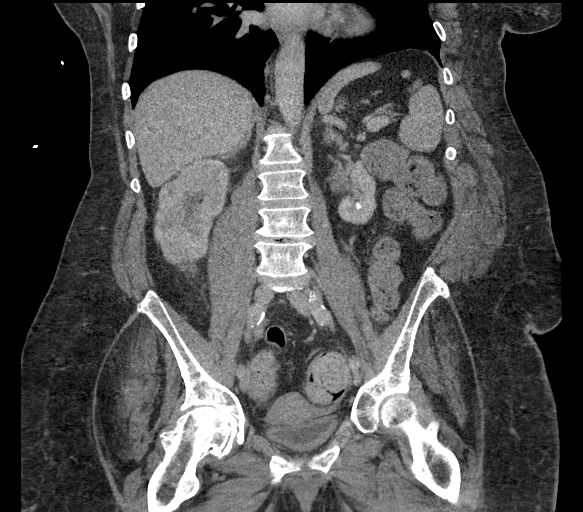

[13 of 46 positions shown; findings below may reference images not displayed]

RADIATION DOSE REDUCTION: This exam was performed according to the
departmental dose-optimization program which includes automated
exposure control, adjustment of the mA and/or kV according to
patient size and/or use of iterative reconstruction technique.

CONTRAST:  80mL OMNIPAQUE IOHEXOL 350 MG/ML SOLN
FINDINGS: CTA CHEST FINDINGS

Cardiovascular: There is adequate opacification of the pulmonary
arterial tree. No intraluminal filling defect identified to suggest
acute pulmonary embolism. The central pulmonary arteries are
enlarged in keeping with changes of pulmonary arterial hypertension.
Cardiac size is mildly enlarged, progressive since prior
examination. No pericardial effusion. No significant coronary artery
calcification. The ascending thoracic aorta is mildly dilated
measuring 4.2 cm in diameter on reformatted images, stable since
prior examination. No superimposed significant atherosclerotic
plaque.

Mediastinum/Nodes: Tracheostomy in expected position. No pathologic
thoracic adenopathy. Esophagus unremarkable.

Lungs/Pleura: Band like parenchymal scarring within the left upper
lobe is unchanged. Stable 6 mm subpleural pulmonary nodule within
the right upper lobe, axial image # 67/6, benign. No new focal
pulmonary nodules or infiltrates. No pneumothorax or pleural
effusion.

Musculoskeletal: No chest wall abnormality. No acute or significant
osseous findings.

Review of the MIP images confirms the above findings.

CT ABDOMEN and PELVIS FINDINGS

Hepatobiliary: Status post cholecystectomy. Mild intra and
extrahepatic biliary ductal dilation may represent post
cholecystectomy change, but is nonspecific. Scattered cysts are seen
within the right hepatic lobe. No enhancing intrahepatic mass.

Pancreas: Unremarkable

Spleen: Unremarkable

Adrenals/Urinary Tract: The adrenal glands are unremarkable. Marked
atrophy of the left kidney, new since prior examination. Right
kidney is normal in size and position. There is moderate right
hydronephrosis and delayed right renal cortical enhancement
secondary to an obstructing 5 x 7 x 14 mm calculus within the
proximal right ureter at the level of L4-5. A more distal
nonobstructing 9 x 4 mm calculus is seen within the distal right
ureter just proximal 2 the right ureterovesicular junction. Mild
right perinephric stranding noted. Superimposed 3 mm nonobstructing
calculus within the lower pole of the right kidney. The bladder is
largely decompressed and is otherwise unremarkable.

Stomach/Bowel: Moderate colonic stool burden without evidence of
obstruction. Scattered diverticular are seen within the distal
colon. The stomach, small bowel, and large bowel are otherwise
unremarkable. Appendix normal. No free intraperitoneal gas or fluid.

Vascular/Lymphatic: Mild atherosclerotic calcification within the
aortoiliac vasculature. No aortic aneurysm. A single enhancing
pathologically enlarged aortocaval lymph node is seen at axial image
# 47/5 measuring 16 mm in short axis diameter. This may be reactive
and related to right renal pathology. This is minimally enlarged
when compared to prior examination where this measured 14 mm. No
additional pathologic adenopathy within the abdomen and pelvis.

Reproductive: Uterus and bilateral adnexa are unremarkable.

Other: No abdominal wall hernia.  Rectum unremarkable.

Musculoskeletal: Degenerative changes are seen within the lumbar
spine and asymmetrically within the right hip. Multifactorial
moderate central canal stenosis noted at L4-5. No acute bone
abnormality. No lytic or blastic bone lesion.

Review of the MIP images confirms the above findings.
IMPRESSION: No pulmonary embolism.

Mild cardiomegaly, new since prior examination. Morphologic changes
in keeping with pulmonary arterial hypertension.

Dilation of the a ascending thoracic aorta measuring 4.2 cm in
greatest dimension, stable since prior examination. Recommend annual
imaging followup by CTA or MRA. This recommendation follows 0212
ACCF/AHA/AATS/ACR/ASA/SCA/MUZQUIZ/SANTAHARJU/GOHAR/HILDEBRAND Guidelines for the
Diagnosis and Management of Patients with Thoracic Aortic Disease.
Circulation. 0212; 121: [id number]. Aortic aneurysm NOS ([id number].S)

Mild intra and extrahepatic biliary ductal dilation may represent
post cholecystectomy change, but appears new since prior
examination. Correlation with liver enzymes would be helpful to
exclude an obstructive process.

Nonobstructing 9 mm distal right ureteral calculus an obstructing 5
x 7 x 14 mm calculus within the proximal right ureter resulting in
moderate right hydronephrosis, delayed right renal enhancement, and
mild right perinephric stranding. Superimposed minimal right
nonobstructing nephrolithiasis. Possible reactive aortocaval lymph
node.

Interval development of marked left renal cortical atrophy.

Moderate colonic stool burden without evidence of obstruction.

Aortic Atherosclerosis ([id number]).
